# Patient Record
Sex: Female | Born: 1945
Health system: Southern US, Community
[De-identification: ages and names within clinical notes are randomized; demographics above are authoritative.]

## PROBLEM LIST (undated history)

## (undated) DIAGNOSIS — K219 Gastro-esophageal reflux disease without esophagitis: Secondary | ICD-10-CM

## (undated) DIAGNOSIS — I10 Essential (primary) hypertension: Secondary | ICD-10-CM

## (undated) DIAGNOSIS — E875 Hyperkalemia: Secondary | ICD-10-CM

## (undated) DIAGNOSIS — M6282 Rhabdomyolysis: Secondary | ICD-10-CM

## (undated) DIAGNOSIS — I82409 Acute embolism and thrombosis of unspecified deep veins of unspecified lower extremity: Secondary | ICD-10-CM

## (undated) DIAGNOSIS — F329 Major depressive disorder, single episode, unspecified: Secondary | ICD-10-CM

## (undated) DIAGNOSIS — Z794 Long term (current) use of insulin: Secondary | ICD-10-CM

## (undated) DIAGNOSIS — E785 Hyperlipidemia, unspecified: Secondary | ICD-10-CM

## (undated) DIAGNOSIS — J9601 Acute respiratory failure with hypoxia: Secondary | ICD-10-CM

## (undated) DIAGNOSIS — E119 Type 2 diabetes mellitus without complications: Secondary | ICD-10-CM

## (undated) DIAGNOSIS — I5032 Chronic diastolic (congestive) heart failure: Secondary | ICD-10-CM

## (undated) DIAGNOSIS — G9340 Encephalopathy, unspecified: Secondary | ICD-10-CM

## (undated) DIAGNOSIS — I509 Heart failure, unspecified: Secondary | ICD-10-CM

## (undated) DIAGNOSIS — R7989 Other specified abnormal findings of blood chemistry: Secondary | ICD-10-CM

## (undated) DIAGNOSIS — F32A Depression, unspecified: Secondary | ICD-10-CM

## (undated) DIAGNOSIS — N179 Acute kidney failure, unspecified: Secondary | ICD-10-CM

## (undated) DIAGNOSIS — D649 Anemia, unspecified: Secondary | ICD-10-CM

## (undated) DIAGNOSIS — I440 Atrioventricular block, first degree: Secondary | ICD-10-CM

## (undated) DIAGNOSIS — R569 Unspecified convulsions: Secondary | ICD-10-CM

## (undated) DIAGNOSIS — N184 Chronic kidney disease, stage 4 (severe): Secondary | ICD-10-CM

## (undated) DIAGNOSIS — R001 Bradycardia, unspecified: Secondary | ICD-10-CM

## (undated) DIAGNOSIS — I742 Embolism and thrombosis of arteries of the upper extremities: Secondary | ICD-10-CM

## (undated) DIAGNOSIS — G4733 Obstructive sleep apnea (adult) (pediatric): Secondary | ICD-10-CM

## (undated) DIAGNOSIS — Z6841 Body Mass Index (BMI) 40.0 and over, adult: Secondary | ICD-10-CM

## (undated) HISTORY — DX: Rhabdomyolysis: M62.82

## (undated) HISTORY — DX: Essential (primary) hypertension: I10

## (undated) HISTORY — DX: Acute kidney failure, unspecified: N17.9

## (undated) HISTORY — DX: Obstructive sleep apnea (adult) (pediatric): G47.33

## (undated) HISTORY — DX: Heart failure, unspecified: I50.9

## (undated) HISTORY — DX: Body Mass Index (BMI) 40.0 and over, adult: Z684

## (undated) HISTORY — DX: Hyperlipidemia, unspecified: E78.5

## (undated) HISTORY — DX: Embolism and thrombosis of arteries of the upper extremities: I74.2

## (undated) HISTORY — DX: Long term (current) use of insulin: Z79.4

## (undated) HISTORY — DX: Type 2 diabetes mellitus without complications: E11.9

## (undated) HISTORY — DX: Major depressive disorder, single episode, unspecified: F32.9

## (undated) HISTORY — DX: Morbid (severe) obesity due to excess calories: E66.01

## (undated) HISTORY — DX: Acute respiratory failure with hypoxia: J96.01

## (undated) HISTORY — DX: Bradycardia, unspecified: R00.1

## (undated) HISTORY — DX: Encephalopathy, unspecified: G93.40

## (undated) HISTORY — DX: Depression, unspecified: F32.A

## (undated) HISTORY — PX: TUBAL LIGATION: SHX77

## (undated) HISTORY — DX: Atrioventricular block, first degree: I44.0

## (undated) HISTORY — DX: Other specified abnormal findings of blood chemistry: R79.89

## (undated) HISTORY — DX: Hyperkalemia: E87.5

## (undated) HISTORY — DX: Chronic kidney disease, stage 4 (severe): N18.4

## (undated) HISTORY — DX: Chronic diastolic (congestive) heart failure: I50.32

---

## 1997-08-02 ENCOUNTER — Other Ambulatory Visit: Admission: RE | Admit: 1997-08-02 | Discharge: 1997-08-02 | Payer: Self-pay | Admitting: Family Medicine

## 1998-07-10 ENCOUNTER — Ambulatory Visit (HOSPITAL_COMMUNITY): Admission: RE | Admit: 1998-07-10 | Discharge: 1998-07-10 | Payer: Self-pay | Admitting: Family Medicine

## 1998-07-10 ENCOUNTER — Encounter: Payer: Self-pay | Admitting: Family Medicine

## 2001-04-04 ENCOUNTER — Ambulatory Visit (HOSPITAL_COMMUNITY): Admission: RE | Admit: 2001-04-04 | Discharge: 2001-04-04 | Payer: Self-pay | Admitting: Family Medicine

## 2001-04-04 ENCOUNTER — Encounter: Payer: Self-pay | Admitting: Family Medicine

## 2001-11-23 ENCOUNTER — Ambulatory Visit (HOSPITAL_COMMUNITY): Admission: RE | Admit: 2001-11-23 | Discharge: 2001-11-23 | Payer: Self-pay | Admitting: Family Medicine

## 2003-05-14 ENCOUNTER — Ambulatory Visit (HOSPITAL_COMMUNITY): Admission: RE | Admit: 2003-05-14 | Discharge: 2003-05-14 | Payer: Self-pay | Admitting: Family Medicine

## 2003-10-29 ENCOUNTER — Ambulatory Visit: Payer: Self-pay | Admitting: Family Medicine

## 2003-11-05 ENCOUNTER — Ambulatory Visit: Payer: Self-pay | Admitting: Family Medicine

## 2003-12-03 ENCOUNTER — Ambulatory Visit: Payer: Self-pay | Admitting: Family Medicine

## 2003-12-04 ENCOUNTER — Ambulatory Visit: Payer: Self-pay | Admitting: *Deleted

## 2004-02-04 ENCOUNTER — Ambulatory Visit: Payer: Self-pay | Admitting: Family Medicine

## 2004-06-10 ENCOUNTER — Ambulatory Visit: Payer: Self-pay | Admitting: Family Medicine

## 2004-06-16 ENCOUNTER — Ambulatory Visit (HOSPITAL_COMMUNITY): Admission: RE | Admit: 2004-06-16 | Discharge: 2004-06-16 | Payer: Self-pay | Admitting: Family Medicine

## 2005-01-04 ENCOUNTER — Ambulatory Visit: Payer: Self-pay | Admitting: Family Medicine

## 2005-02-18 ENCOUNTER — Ambulatory Visit: Payer: Self-pay | Admitting: Family Medicine

## 2005-05-04 ENCOUNTER — Ambulatory Visit: Payer: Self-pay | Admitting: Family Medicine

## 2005-05-04 ENCOUNTER — Ambulatory Visit (HOSPITAL_COMMUNITY): Admission: RE | Admit: 2005-05-04 | Discharge: 2005-05-04 | Payer: Self-pay | Admitting: Family Medicine

## 2005-08-25 ENCOUNTER — Ambulatory Visit: Payer: Self-pay | Admitting: Family Medicine

## 2005-08-25 LAB — CONVERTED CEMR LAB: Blood Glucose, Fasting: 82 mg/dL

## 2005-09-01 ENCOUNTER — Ambulatory Visit (HOSPITAL_COMMUNITY): Admission: RE | Admit: 2005-09-01 | Discharge: 2005-09-01 | Payer: Self-pay | Admitting: Family Medicine

## 2006-02-09 ENCOUNTER — Ambulatory Visit: Payer: Self-pay | Admitting: Family Medicine

## 2006-02-17 ENCOUNTER — Ambulatory Visit (HOSPITAL_COMMUNITY): Admission: RE | Admit: 2006-02-17 | Discharge: 2006-02-17 | Payer: Self-pay | Admitting: Family Medicine

## 2006-07-13 ENCOUNTER — Ambulatory Visit: Payer: Self-pay | Admitting: Family Medicine

## 2006-09-09 ENCOUNTER — Encounter (INDEPENDENT_AMBULATORY_CARE_PROVIDER_SITE_OTHER): Payer: Self-pay | Admitting: Family Medicine

## 2006-09-09 DIAGNOSIS — E785 Hyperlipidemia, unspecified: Secondary | ICD-10-CM

## 2006-09-09 DIAGNOSIS — E119 Type 2 diabetes mellitus without complications: Secondary | ICD-10-CM

## 2006-09-09 DIAGNOSIS — Z8719 Personal history of other diseases of the digestive system: Secondary | ICD-10-CM

## 2006-09-09 DIAGNOSIS — I10 Essential (primary) hypertension: Secondary | ICD-10-CM | POA: Insufficient documentation

## 2006-09-16 ENCOUNTER — Ambulatory Visit: Payer: Self-pay | Admitting: Family Medicine

## 2006-09-16 DIAGNOSIS — R569 Unspecified convulsions: Secondary | ICD-10-CM

## 2006-09-16 DIAGNOSIS — F329 Major depressive disorder, single episode, unspecified: Secondary | ICD-10-CM

## 2007-02-28 ENCOUNTER — Ambulatory Visit: Payer: Self-pay | Admitting: Family Medicine

## 2007-03-20 ENCOUNTER — Encounter: Admission: RE | Admit: 2007-03-20 | Discharge: 2007-03-20 | Payer: Self-pay | Admitting: Family Medicine

## 2007-05-18 ENCOUNTER — Ambulatory Visit: Payer: Self-pay | Admitting: Family Medicine

## 2007-05-18 LAB — CONVERTED CEMR LAB
AST: 16 units/L (ref 0–37)
Alkaline Phosphatase: 154 units/L — ABNORMAL HIGH (ref 39–117)
BUN: 25 mg/dL — ABNORMAL HIGH (ref 6–23)
Calcium: 9.1 mg/dL (ref 8.4–10.5)
Chloride: 107 meq/L (ref 96–112)
Glucose, Bld: 103 mg/dL — ABNORMAL HIGH (ref 70–99)
Microalb, Ur: 0.96 mg/dL (ref 0.00–1.89)
Total Bilirubin: 0.4 mg/dL (ref 0.3–1.2)
Triglycerides: 62 mg/dL (ref <150)

## 2007-05-25 ENCOUNTER — Ambulatory Visit: Payer: Self-pay | Admitting: Family Medicine

## 2007-06-22 ENCOUNTER — Ambulatory Visit: Payer: Self-pay | Admitting: Family Medicine

## 2007-06-22 LAB — CONVERTED CEMR LAB
CO2: 20 meq/L (ref 19–32)
Calcium: 8.5 mg/dL (ref 8.4–10.5)
Chloride: 110 meq/L (ref 96–112)
Creatinine, Ser: 1.47 mg/dL — ABNORMAL HIGH (ref 0.40–1.20)
Potassium: 5.9 meq/L — ABNORMAL HIGH (ref 3.5–5.3)
Sodium: 137 meq/L (ref 135–145)
Vit D, 1,25-Dihydroxy: 6 — ABNORMAL LOW (ref 30–89)

## 2007-07-21 ENCOUNTER — Ambulatory Visit: Payer: Self-pay | Admitting: Family Medicine

## 2007-10-20 ENCOUNTER — Inpatient Hospital Stay (HOSPITAL_COMMUNITY): Admission: EM | Admit: 2007-10-20 | Discharge: 2007-10-22 | Payer: Self-pay | Admitting: Emergency Medicine

## 2007-10-25 ENCOUNTER — Encounter (INDEPENDENT_AMBULATORY_CARE_PROVIDER_SITE_OTHER): Payer: Self-pay | Admitting: Family Medicine

## 2007-10-25 ENCOUNTER — Ambulatory Visit: Payer: Self-pay | Admitting: Internal Medicine

## 2007-10-25 LAB — CONVERTED CEMR LAB: Potassium: 4.4 meq/L (ref 3.5–5.3)

## 2007-12-12 ENCOUNTER — Ambulatory Visit: Payer: Self-pay | Admitting: Family Medicine

## 2007-12-12 LAB — CONVERTED CEMR LAB
Calcium: 8.6 mg/dL (ref 8.4–10.5)
Glucose, Bld: 203 mg/dL — ABNORMAL HIGH (ref 70–99)
Sodium: 140 meq/L (ref 135–145)

## 2007-12-13 ENCOUNTER — Emergency Department (HOSPITAL_COMMUNITY): Admission: EM | Admit: 2007-12-13 | Discharge: 2007-12-13 | Payer: Self-pay | Admitting: Emergency Medicine

## 2007-12-14 ENCOUNTER — Ambulatory Visit: Payer: Self-pay | Admitting: Family Medicine

## 2007-12-25 ENCOUNTER — Ambulatory Visit: Payer: Self-pay | Admitting: Internal Medicine

## 2008-01-05 ENCOUNTER — Ambulatory Visit: Payer: Self-pay | Admitting: Family Medicine

## 2008-02-22 ENCOUNTER — Ambulatory Visit: Payer: Self-pay | Admitting: Family Medicine

## 2008-03-16 ENCOUNTER — Emergency Department (HOSPITAL_COMMUNITY): Admission: EM | Admit: 2008-03-16 | Discharge: 2008-03-16 | Payer: Self-pay | Admitting: Emergency Medicine

## 2008-05-30 ENCOUNTER — Ambulatory Visit: Payer: Self-pay | Admitting: Family Medicine

## 2008-05-30 LAB — CONVERTED CEMR LAB
Alkaline Phosphatase: 143 units/L — ABNORMAL HIGH (ref 39–117)
Calcium: 8.6 mg/dL (ref 8.4–10.5)
Potassium: 4.4 meq/L (ref 3.5–5.3)
Sodium: 135 meq/L (ref 135–145)
TSH: 2.057 microintl units/mL (ref 0.350–4.500)
Total Protein: 7 g/dL (ref 6.0–8.3)

## 2008-06-04 ENCOUNTER — Ambulatory Visit (HOSPITAL_COMMUNITY): Admission: RE | Admit: 2008-06-04 | Discharge: 2008-06-04 | Payer: Self-pay | Admitting: Family Medicine

## 2008-11-05 ENCOUNTER — Ambulatory Visit: Payer: Self-pay | Admitting: Family Medicine

## 2009-04-23 ENCOUNTER — Encounter (INDEPENDENT_AMBULATORY_CARE_PROVIDER_SITE_OTHER): Payer: Self-pay | Admitting: *Deleted

## 2009-05-29 ENCOUNTER — Ambulatory Visit: Payer: Self-pay | Admitting: Family Medicine

## 2009-05-29 LAB — CONVERTED CEMR LAB
Basophils Absolute: 0 10*3/uL (ref 0.0–0.1)
Basophils Relative: 0 % (ref 0–1)
CO2: 23 meq/L (ref 19–32)
Calcium: 9.2 mg/dL (ref 8.4–10.5)
HCT: 33.3 % — ABNORMAL LOW (ref 36.0–46.0)
Hemoglobin: 11.6 g/dL — ABNORMAL LOW (ref 12.0–15.0)
MCHC: 34.8 g/dL (ref 30.0–36.0)
MCV: 87.9 fL (ref 78.0–100.0)
Monocytes Relative: 11 % (ref 3–12)
Platelets: 226 10*3/uL (ref 150–400)
RBC: 3.79 M/uL — ABNORMAL LOW (ref 3.87–5.11)
RDW: 11.6 % (ref 11.5–15.5)
WBC: 4.6 10*3/uL (ref 4.0–10.5)

## 2009-06-09 ENCOUNTER — Ambulatory Visit (HOSPITAL_COMMUNITY): Admission: RE | Admit: 2009-06-09 | Discharge: 2009-06-09 | Payer: Self-pay | Admitting: Family Medicine

## 2009-06-09 ENCOUNTER — Encounter: Admission: RE | Admit: 2009-06-09 | Discharge: 2009-06-09 | Payer: Self-pay | Admitting: Family Medicine

## 2009-08-06 ENCOUNTER — Emergency Department (HOSPITAL_COMMUNITY): Admission: EM | Admit: 2009-08-06 | Discharge: 2009-08-06 | Payer: Self-pay | Admitting: Family Medicine

## 2009-08-06 ENCOUNTER — Emergency Department (HOSPITAL_COMMUNITY): Admission: EM | Admit: 2009-08-06 | Discharge: 2009-08-06 | Payer: Self-pay | Admitting: Emergency Medicine

## 2009-08-19 ENCOUNTER — Ambulatory Visit: Payer: Self-pay | Admitting: Family Medicine

## 2010-02-15 ENCOUNTER — Encounter: Payer: Self-pay | Admitting: Family Medicine

## 2010-02-24 NOTE — Letter (Signed)
Summary: Generic Letter  [facility]  [address]   [place], [place] [address]   Phone: [phone]  Fax: [phone]    04/23/2009  [name] [address] [place], [place]  [address]  Dear Ms. Botkin,  We have been unable to contact you by phone. Please call our office to schedule a visit. You will need to see Dr. Leward Quan and have lab work done prior to additional medication refills.         Sincerely,   Bridgett Larsson RN

## 2010-04-03 ENCOUNTER — Emergency Department (HOSPITAL_COMMUNITY): Payer: Medicare Other

## 2010-04-03 ENCOUNTER — Inpatient Hospital Stay (HOSPITAL_COMMUNITY)
Admission: EM | Admit: 2010-04-03 | Discharge: 2010-04-06 | DRG: 641 | Disposition: A | Discharge status: Home / Self Care | Payer: Medicare Other | Attending: Family Medicine | Admitting: Family Medicine

## 2010-04-03 ENCOUNTER — Encounter (INDEPENDENT_AMBULATORY_CARE_PROVIDER_SITE_OTHER): Payer: Self-pay | Admitting: Family Medicine

## 2010-04-03 DIAGNOSIS — I129 Hypertensive chronic kidney disease with stage 1 through stage 4 chronic kidney disease, or unspecified chronic kidney disease: Secondary | ICD-10-CM | POA: Diagnosis present

## 2010-04-03 DIAGNOSIS — T502X5A Adverse effect of carbonic-anhydrase inhibitors, benzothiadiazides and other diuretics, initial encounter: Secondary | ICD-10-CM | POA: Diagnosis present

## 2010-04-03 DIAGNOSIS — D638 Anemia in other chronic diseases classified elsewhere: Secondary | ICD-10-CM | POA: Diagnosis present

## 2010-04-03 DIAGNOSIS — G40909 Epilepsy, unspecified, not intractable, without status epilepticus: Secondary | ICD-10-CM | POA: Diagnosis present

## 2010-04-03 DIAGNOSIS — N183 Chronic kidney disease, stage 3 unspecified: Secondary | ICD-10-CM | POA: Diagnosis present

## 2010-04-03 DIAGNOSIS — E875 Hyperkalemia: Principal | ICD-10-CM | POA: Diagnosis present

## 2010-04-03 DIAGNOSIS — Z79899 Other long term (current) drug therapy: Secondary | ICD-10-CM

## 2010-04-03 DIAGNOSIS — N179 Acute kidney failure, unspecified: Secondary | ICD-10-CM | POA: Diagnosis present

## 2010-04-03 DIAGNOSIS — E1169 Type 2 diabetes mellitus with other specified complication: Secondary | ICD-10-CM | POA: Diagnosis not present

## 2010-04-03 DIAGNOSIS — E785 Hyperlipidemia, unspecified: Secondary | ICD-10-CM | POA: Diagnosis present

## 2010-04-03 LAB — GLUCOSE, CAPILLARY
Glucose-Capillary: 116 mg/dL — ABNORMAL HIGH (ref 70–99)
Glucose-Capillary: 122 mg/dL — ABNORMAL HIGH (ref 70–99)
Glucose-Capillary: 221 mg/dL — ABNORMAL HIGH (ref 70–99)

## 2010-04-03 LAB — CONVERTED CEMR LAB
ALT: 13 units/L (ref 0–35)
AST: 16 units/L (ref 0–37)
BUN: 27 mg/dL — ABNORMAL HIGH (ref 6–23)
Basophils Absolute: 0 10*3/uL (ref 0.0–0.1)
Basophils Relative: 0 % (ref 0–1)
Cholesterol: 139 mg/dL (ref 0–200)
Creatinine, Ser: 1.83 mg/dL — ABNORMAL HIGH (ref 0.40–1.20)
Eosinophils Relative: 0 % (ref 0–5)
HCT: 33.2 % — ABNORMAL LOW (ref 36.0–46.0)
HDL: 52 mg/dL (ref >39)
Hemoglobin: 10.7 g/dL — ABNORMAL LOW (ref 12.0–15.0)
Hgb A1c MFr Bld: 6.5 % — ABNORMAL HIGH (ref <5.7)
MCHC: 32.2 g/dL (ref 30.0–36.0)
MCV: 90.5 fL (ref 78.0–100.0)
Monocytes Absolute: 0.5 10*3/uL (ref 0.1–1.0)
RDW: 12.5 % (ref 11.5–15.5)
TSH: 1.626 microintl units/mL (ref 0.350–4.500)
Total Bilirubin: 0.3 mg/dL (ref 0.3–1.2)
Total CHOL/HDL Ratio: 2.7
VLDL: 18 mg/dL (ref 0–40)

## 2010-04-03 LAB — DIFFERENTIAL
Basophils Absolute: 0 10*3/uL (ref 0.0–0.1)
Basophils Relative: 0 % (ref 0–1)
Eosinophils Relative: 0 % (ref 0–5)
Lymphs Abs: 1.1 10*3/uL (ref 0.7–4.0)
Monocytes Relative: 12 % (ref 3–12)

## 2010-04-03 LAB — COMPREHENSIVE METABOLIC PANEL
Albumin: 3.6 g/dL (ref 3.5–5.2)
Alkaline Phosphatase: 143 U/L — ABNORMAL HIGH (ref 39–117)
BUN: 25 mg/dL — ABNORMAL HIGH (ref 6–23)
CO2: 22 mEq/L (ref 19–32)
Calcium: 8.3 mg/dL — ABNORMAL LOW (ref 8.4–10.5)
GFR calc Af Amer: 36 mL/min — ABNORMAL LOW (ref >60)
Potassium: 5.6 mEq/L — ABNORMAL HIGH (ref 3.5–5.1)
Sodium: 135 mEq/L (ref 135–145)
Total Bilirubin: 0.5 mg/dL (ref 0.3–1.2)

## 2010-04-03 LAB — BASIC METABOLIC PANEL
BUN: 26 mg/dL — ABNORMAL HIGH (ref 6–23)
Creatinine, Ser: 1.75 mg/dL — ABNORMAL HIGH (ref 0.4–1.2)
GFR calc Af Amer: 36 mL/min — ABNORMAL LOW (ref >60)
GFR calc non Af Amer: 29 mL/min — ABNORMAL LOW (ref >60)
Potassium: 7 mEq/L (ref 3.5–5.1)

## 2010-04-03 LAB — CBC
Hemoglobin: 10.8 g/dL — ABNORMAL LOW (ref 12.0–15.0)
MCH: 29.3 pg (ref 26.0–34.0)
Platelets: 198 10*3/uL (ref 150–400)
RDW: 12.3 % (ref 11.5–15.5)
WBC: 3.7 10*3/uL — ABNORMAL LOW (ref 4.0–10.5)

## 2010-04-03 LAB — POTASSIUM: Potassium: 5.4 mEq/L — ABNORMAL HIGH (ref 3.5–5.1)

## 2010-04-03 LAB — POCT I-STAT, CHEM 8
BUN: 29 mg/dL — ABNORMAL HIGH (ref 6–23)
Calcium, Ion: 1.22 mmol/L (ref 1.12–1.32)
Creatinine, Ser: 1.8 mg/dL — ABNORMAL HIGH (ref 0.4–1.2)
Glucose, Bld: 115 mg/dL — ABNORMAL HIGH (ref 70–99)
HCT: 35 % — ABNORMAL LOW (ref 36.0–46.0)
TCO2: 20 mmol/L (ref 0–100)

## 2010-04-03 LAB — URINALYSIS, ROUTINE W REFLEX MICROSCOPIC
Bilirubin Urine: NEGATIVE
Ketones, ur: NEGATIVE mg/dL
Nitrite: NEGATIVE
Protein, ur: NEGATIVE mg/dL
pH: 5.5 (ref 5.0–8.0)

## 2010-04-03 LAB — URINE MICROSCOPIC-ADD ON

## 2010-04-04 LAB — BASIC METABOLIC PANEL
BUN: 24 mg/dL — ABNORMAL HIGH (ref 6–23)
BUN: 21 mg/dL (ref 6–23)
BUN: 20 mg/dL (ref 6–23)
Chloride: 110 mEq/L (ref 96–112)
Chloride: 112 mEq/L (ref 96–112)
Chloride: 113 mEq/L — ABNORMAL HIGH (ref 96–112)
Glucose, Bld: 114 mg/dL — ABNORMAL HIGH (ref 70–99)
Glucose, Bld: 159 mg/dL — ABNORMAL HIGH (ref 70–99)
Glucose, Bld: 199 mg/dL — ABNORMAL HIGH (ref 70–99)
Potassium: 5.9 mEq/L — ABNORMAL HIGH (ref 3.5–5.1)
Potassium: 5.6 mEq/L — ABNORMAL HIGH (ref 3.5–5.1)
Potassium: 5.3 mEq/L — ABNORMAL HIGH (ref 3.5–5.1)

## 2010-04-05 LAB — GLUCOSE, CAPILLARY
Glucose-Capillary: 258 mg/dL — ABNORMAL HIGH (ref 70–99)
Glucose-Capillary: 154 mg/dL — ABNORMAL HIGH (ref 70–99)
Glucose-Capillary: 166 mg/dL — ABNORMAL HIGH (ref 70–99)

## 2010-04-05 LAB — BASIC METABOLIC PANEL
CO2: 17 mEq/L — ABNORMAL LOW (ref 19–32)
Chloride: 111 mEq/L (ref 96–112)
Creatinine, Ser: 1.47 mg/dL — ABNORMAL HIGH (ref 0.4–1.2)
GFR calc Af Amer: 43 mL/min — ABNORMAL LOW (ref >60)
GFR calc non Af Amer: 40 mL/min — ABNORMAL LOW (ref >60)
GFR calc non Af Amer: 36 mL/min — ABNORMAL LOW (ref >60)
Glucose, Bld: 171 mg/dL — ABNORMAL HIGH (ref 70–99)
Potassium: 5 mEq/L (ref 3.5–5.1)
Sodium: 138 mEq/L (ref 135–145)

## 2010-04-06 LAB — BASIC METABOLIC PANEL
BUN: 14 mg/dL (ref 6–23)
Calcium: 8 mg/dL — ABNORMAL LOW (ref 8.4–10.5)
Creatinine, Ser: 1.38 mg/dL — ABNORMAL HIGH (ref 0.4–1.2)
GFR calc non Af Amer: 39 mL/min — ABNORMAL LOW (ref >60)
Potassium: 4.6 mEq/L (ref 3.5–5.1)

## 2010-04-06 LAB — GLUCOSE, CAPILLARY: Glucose-Capillary: 141 mg/dL — ABNORMAL HIGH (ref 70–99)

## 2010-04-06 NOTE — Discharge Summary (Signed)
[name], [name]                ACCOUNT NO.:  [id number]  MEDICAL RECORD NO.:  [id number]           PATIENT TYPE:  I  LOCATION:  6703                         FACILITY:  [facility]  PHYSICIAN:  Murray Hodgkins, MD    DATE OF BIRTH:  [birth year]  DATE OF ADMISSION:  04/03/2010 DATE OF DISCHARGE:  04/06/2010                        DISCHARGE SUMMARY - REFERRING   PRIMARY CARE PHYSICIAN:  [facility].  CONDITION ON DISCHARGE:  Improved.  DISCHARGE DIAGNOSES: 1. Hyperkalemia secondary to amiloride, resolved. 2. Acute renal failure secondary to diuretic therapy, resolved. 3. Diabetes mellitus type 2 with hypoglycemia, now stable. 4. History of seizure disorder, stable. 5. Chronic anemia, stable.  HISTORY OF PRESENT ILLNESS:  This is a 65 year old woman who was feeling quite well when she went to see her primary care physician for routine blood work.  She was then called and told that her potassium was high and instructed to come to the emergency room.  In the emergency room, it was found that her potassium remained elevated.  And she was found to be in acute renal failure.  She was therefore admitted for stabilization.  HOSPITAL COURSE: 1. Hyperkalemia.  The patient was admitted to medical floor and placed     on telemetry.  Hyperkalemia only slowly resolved with multiple     doses of Kayexalate and in fact continued to rebound even after     many doses of  Kayexalate.  This is not uncommon in medication-     induced hyperkalemia.  Lovenox and heparin were avoided as they can     also contribute to hyperkalemia.  Her blood work will be checked     tomorrow on March 12 and if potassium has remained stable without     further Kayexalate or IV fluids, she will be discharged home. 2. Acute renal failure, superimposed on chronic kidney disease stage     III.  This appears to be resolved at this point and the patient's     creatinine is actually better than previous data points in the E-    Chart.  Diuretics discontinued on discharge.  She was on diuretic     for hypertension.  IV fluids saline locked at this point. 3. Diabetes mellitus with transient hypoglycemia.  This has resolved.     She will continue on Starlix. 4. History of seizure disorder.  This remained stable.  Continue     Tegretol. 5. Chronic anemia.  This remained stable.  CONSULTATIONS:  None.  PROCEDURES:  None.  IMAGING:  Chest x-ray on March 09:  No acute cardiopulmonary disease.  PERTINENT LABORATORY STUDIES: 1. CBC unremarkable. 2. Basic metabolic panel notable for potassium of 5.7 on admission     with a creatinine of 1.8, peak potassium is of 5.9.  Potassium     today 5.0 and creatinine 1.34.  Repeat basic metabolic panel     pending for the morning.  DISCHARGE INSTRUCTIONS:  The patient will be discharged home.  Diet is a heart-healthy diabetic diet.  Activities as tolerated.  She should follow up with her physician at [facility] in 1-2 weeks.  DISCHARGE MEDICATIONS:  1. Clonidine 0.1 mg p.o. b.i.d. 2. Carbamazepine 200 mg 2 tablets p.o. b.i.d. 3. Citalopram 20 mg p.o. daily. 4. Glipizide XL 10 mg p.o. b.i.d. 5. Metoprolol XL 50 mg p.o. daily. 6. Nateglinide 120 mg p.o. t.i.d. 7. Vytorin 10/40 one tablet every evening.  Discontinue following medication. 1. Combination tablet amiloride/hydrochlorothiazide.  Clonidine has been added as a second agent for blood pressure control.  THINGS TO FOLLOW UP IN THE OUTPATIENT SETTING: 1. Continue treatment for her hypertension.  Consider repeat basic     metabolic panel.  Time coordinating discharge 35 minutes.     Murray Hodgkins, MD     DG/MEDQ  D:  04/05/2010  T:  04/05/2010  Job:  [id number]  Electronically Signed by Murray Hodgkins  on 04/06/2010 08:37:16 PM

## 2010-04-12 LAB — URINE CULTURE
Colony Count: >=100000
Culture  Setup Time: 201107130039

## 2010-04-12 LAB — URINE MICROSCOPIC-ADD ON

## 2010-04-12 LAB — URINALYSIS, ROUTINE W REFLEX MICROSCOPIC
Bilirubin Urine: NEGATIVE
Ketones, ur: NEGATIVE mg/dL
Protein, ur: NEGATIVE mg/dL
Urobilinogen, UA: 0.2 mg/dL (ref 0.0–1.0)

## 2010-04-12 LAB — DIFFERENTIAL
Basophils Absolute: 0 10*3/uL (ref 0.0–0.1)
Basophils Relative: 0 % (ref 0–1)
Eosinophils Relative: 0 % (ref 0–5)
Monocytes Absolute: 0.6 10*3/uL (ref 0.1–1.0)

## 2010-04-12 LAB — COMPREHENSIVE METABOLIC PANEL
ALT: 17 U/L (ref 0–35)
Calcium: 8.5 mg/dL (ref 8.4–10.5)
Creatinine, Ser: 1.57 mg/dL — ABNORMAL HIGH (ref 0.4–1.2)
Glucose, Bld: 190 mg/dL — ABNORMAL HIGH (ref 70–99)
Sodium: 140 mEq/L (ref 135–145)
Total Protein: 6.9 g/dL (ref 6.0–8.3)

## 2010-04-12 LAB — CBC
HCT: 34.5 % — ABNORMAL LOW (ref 36.0–46.0)
MCH: 31.4 pg (ref 26.0–34.0)
MCHC: 33.4 g/dL (ref 30.0–36.0)
RDW: 12.3 % (ref 11.5–15.5)

## 2010-04-18 NOTE — H&P (Signed)
[name], [name]                ACCOUNT NO.:  [id number]  MEDICAL RECORD NO.:  [id number]           PATIENT TYPE:  I  LOCATION:  6703                         FACILITY:  [facility]  PHYSICIAN:  Murray Hodgkins, MD    DATE OF BIRTH:  1945/11/12  DATE OF ADMISSION:  04/03/2010 DATE OF DISCHARGE:                             HISTORY & PHYSICAL   PRIMARY CARE PHYSICIAN:  [facility].  CHIEF COMPLAINT:  The patient is sent by her primary care physician for abnormal lab work.  HISTORY OF PRESENT ILLNESS:  [name] is a 65 year old woman with history of type 2 diabetes, hyperlipidemia, and hypertension who presents to [facility] Emergency Department today with reports of abnormal blood work.  Apparently, the patient was seen by her primary care physician on the day prior to admission for scheduled visit.  At that time, the patient underwent routine lab work showing elevated potassium.  The patient was called at home by primary care physician's office and asked to come to the ER due to lab findings.  The patient denies any complaints.  Specifically, the patient denies any recent headache, dizziness, chest pain, shortness of breath, abdominal pain, nausea, vomiting, diarrhea, or fever.  Upon evaluation in the emergency department, the patient was found to have a potassium of 5.6 with a mild elevation of creatinine at 1.73. The patient had a similar admission in 2009 at which time she was taken off her Avapro.  The patient is to be admitted by [facility] at this time for further evaluation and treatment.  PAST MEDICAL HISTORY: 1. Type 2 diabetes. 2. Hyperlipidemia. 3. Hypertension. 4. History of seizure disorder. 5. Remote BTL. 6. Chronic anemia.  MEDICATIONS: 1. Tegretol 200 mg 1 tablets p.o. b.i.d. 2. Vytorin 10-40 mg p.o. b.i.d. 3. Citalopram 20 mg p.o. b.i.d. 4. Starlix 120 mg p.o. t.i.d. 5. Metoprolol XL 50 mg p.o. b.i.d. 6. Amiloride/HCTZ 5/50 one tablet  p.o. daily. 7. Glipizide XL 10 mg p.o. daily.  ALLERGIES:  No known drug allergies.  FAMILY HISTORY:  Mother is deceased at age 70 with diabetic complications.  Unknown history with the patient's father.  The patient has 2 brothers and 1 sister who are alive and well.  SOCIAL HISTORY:  The patient is single.  She is currently living with her daughter.  She denies tobacco or EtOH use.  REVIEW OF SYSTEMS:  As stated in HPI, otherwise negative.  PHYSICAL EXAMINATION:  VITAL SIGNS:  Blood pressure 144/61, heart rate 62, respirations 18, temperature 98.3, and O2 sat 100% on room air. GENERAL:  This is a morbidly obese African American female, sitting on bedside, in no acute distress. HEAD:  Normocephalic and atraumatic. EYES:  Extraocular movements are intact without scleral icterus or injection. EAR, NOSE, AND THROAT:  Mucous membranes are moist.  No oropharyngeal lesions. NECK:  Thick and supple with no thyromegaly or lymphadenopathy.  No JVD or carotid bruits. CHEST:  With symmetrical movement, nontender to palpation. CARDIOVASCULAR:  S1 and S2.  Regular rate and rhythm.  No murmur, rub, or gallop.  The patient with trace pretibial edema bilaterally to lower extremities,  to midshin. RESPIRATORY:  Lung sounds are clear to auscultation bilaterally.  No wheezes, rales, or crackles.  No increased work of breathing. GI:  Abdomen is obese, soft, nontender, and nondistended with positive bowel sounds.  No appreciated masses or hepatosplenomegaly. NEUROLOGICAL:  The patient is able to move all extremities x4 without motor or sensory deficit on exam. PSYCHOLOGICAL:  The patient is alert and oriented x4 with very pleasant mood and affect.  PERTINENT LABORATORY DATA AND ANCILLARY STUDIES:  White cell count 3.7, platelet count 198, hemoglobin 10.8 with MCV 88.6, and hematocrit 32.6. Sodium 135, potassium 5.6, chloride 110, CO2 of 22, BUN 25, creatinine 1.73, serum glucose 118, total  bilirubin 0.5, alkaline phosphatase 143, AST 16, ALT 17, albumin 3.6.  Chest x-ray showing no active disease.  EKG showing sinus rhythm at 63 beats per minute with no acute T-wave abnormalities.  ASSESSMENT/PLAN: 1. Hyperkalemia.  Likely medication effect with amiloride/HCTZ plus or     minus some mild dehydration with diuretic use.  The patient will be     admitted to Telemetry Unit.  She has been given p.o. Kayexalate as     well as IV insulin and D50 in the emergency department.  We will     recheck the patient's potassium this evening to determine need for     further treatment.  We will order for gentle IV fluid hydration. 2. Acute renal failure in the setting of chronic kidney disease.     Suspect related to home diuretic use.  Creatinine only mildly     elevated at this time.  We will order for gentle IV fluid.  We will     hold the patient's diuretic.  We will recheck the patient's renal     function in the morning to determine need for further workup. 3. Anemia, chronic disease.  Stable. 4. Type 2 diabetes.  We will order for sliding scale insulin while     inpatient along with the patient's Starlix.  We     will check hemoglobin A1c. 5. Prophylaxis.  We will avoid subcu Lovenox and heparin as this can     exacerbate the patient's hyperkalemia.  We will order for SCDs for     DVT prophylaxis.     Patrici Ranks, NP   ______________________________ Murray Hodgkins, MD    LE/MEDQ  D:  04/06/2010  T:  04/06/2010  Job:  [id number]  cc:   Barton Fanny, M.D.  Electronically Signed by Patrici Ranks NP on 04/17/2010 10:09:31 AM Electronically Signed by Murray Hodgkins  on 04/18/2010 12:25:39 PM

## 2010-05-14 ENCOUNTER — Emergency Department (HOSPITAL_COMMUNITY)
Admission: EM | Admit: 2010-05-14 | Discharge: 2010-05-14 | Disposition: A | Discharge status: Home / Self Care | Payer: Medicare Other | Attending: Emergency Medicine | Admitting: Emergency Medicine

## 2010-05-14 DIAGNOSIS — R569 Unspecified convulsions: Secondary | ICD-10-CM | POA: Insufficient documentation

## 2010-05-14 DIAGNOSIS — I10 Essential (primary) hypertension: Secondary | ICD-10-CM | POA: Insufficient documentation

## 2010-05-14 DIAGNOSIS — E1169 Type 2 diabetes mellitus with other specified complication: Secondary | ICD-10-CM | POA: Insufficient documentation

## 2010-05-14 DIAGNOSIS — E785 Hyperlipidemia, unspecified: Secondary | ICD-10-CM | POA: Insufficient documentation

## 2010-05-14 LAB — GLUCOSE, CAPILLARY: Glucose-Capillary: 268 mg/dL — ABNORMAL HIGH (ref 70–99)

## 2010-06-09 NOTE — Discharge Summary (Signed)
NAMEGLEE, HINIKER                ACCOUNT NO.:  [id number]   MEDICAL RECORD NO.:  [id number]          PATIENT TYPE:  INP   LOCATION:  6733                         FACILITY:  [facility]   PHYSICIAN:  Rexene Alberts, M.D.    DATE OF BIRTH:  27-Feb-1945   DATE OF ADMISSION:  10/20/2007  DATE OF DISCHARGE:  10/22/2007                               DISCHARGE SUMMARY   DISCHARGE DIAGNOSES:  1. Hyperkalemia, thought to be secondary to Avapro (Avalide).  2. Nausea, vomiting, and dizziness secondary to hyperkalemia.  3. Hypertension.  4. Type 2 diabetes mellitus.  5. Renal insufficiency, likely chronic secondary to diabetes mellitus      and hypertension.  6. Normocytic anemia.  7. Hyperlipidemia.  8. Seizure disorder.  9. Mild fungal cellulitis of the breasts bilaterally.   DISCHARGE MEDICATIONS:  1. Stop Avalide and triamterene.  2. Toprol-XL 50 mg daily.  3. Hydrochlorothiazide 25 mg daily.  4. Over-the-counter nystatin cream applied to breasts as directed.  5. Starlix 120 mg t.i.d. with meals.  6. Vitamin D 50,000 international units weekly.  7. Carbamazepine 200 mg 2 tablets b.i.d.  8. Vytorin 10/40 mg daily.  9. Celexa 20 mg daily.  10.Glipizide ER 10 mg b.i.d.   DISCHARGE DISPOSITION:  The patient is being discharged to home in  improved and stable condition.  She was advised to follow up with her  primary care physician in 2-3 days.   CONSULTATIONS:  None.   PROCEDURES PERFORMED:  1. CT scan of the head on October 20, 2003.  The results revealed no      acute intracranial abnormalities.  2. Chest x-ray on October 20, 2003.  The results revealed      cardiomegaly with linear subsegmental atelectasis at the right      base.   HISTORY OF PRESENT ILLNESS:  The patient is a 65 year old woman with a  past medical history significant for type 2 diabetes mellitus,  hypertension, and seizure disorder.  She presented to the emergency  department on October 20, 2007 with a chief  complaint of nausea,  vomiting, and dizziness.  When the patient was evaluated in the  emergency department, her serum potassium was found to be 7.  A CT scan  of the head was ordered and the results were nonacute.  Her EKG at the  time of the initial hospital assessment revealed normal sinus rhythm  with a heart rate of 73 beats per minute and a first-degree AV block.  The patient was admitted for further evaluation and management.   For additional details, please see the dictated history and physical.   HOSPITAL COURSE:  1. HYPERKALEMIA.  The patient was hemodynamically stable at the time      of the initial hospital assessment.  During the evaluation in the      emergency department, she was treated with calcium gluconate,      sodium bicarbonate, Novolog insulin that followed an amp of D50,      and 2 doses of Kayexalate.  Her serum potassium level was rechecked      shortly thereafter  and it was still elevated at 6.8.  The patient's      serum potassium was monitored frequently.  She was given several      more doses of Kayexalate as well as Lasix.  Her IV fluids which had      been started at normal saline were changed to half-normal saline in      an attempt to decrease her serum potassium further.  On hospital      day #2, her serum potassium improved to 5.8.  After giving more      Lasix and Kayexalate, it decreased to 5.2.  As of today, her serum      potassium is 4.7.  The patient's nausea and vomiting as well as      dizziness have completely resolved.   At the time of the initial assessment by Dr. Arnoldo Morale, the patient could  not recall the names or the doses of her medications.  On hospital day  #2, a medication list was obtained and confirmed with the patient.  In  review of her medications, she had been treated with  triamterene/hydrochlorothiazide and Avalide.  Triamterene and Avalide  were discontinued.  The patient was restarted on hydrochlorothiazide.  She was  instructed to discontinue Avalide and triamterene.  She was also  instructed to continue hydrochlorothiazide at 25 mg daily.  1. HYPERTENSION.  As indicated above, triamterene and Avalide were      discontinued during the hospital course.  Because of her      hypertension, the patient was started on Toprol-XL.  As above, she      was maintained on hydrochlorothiazide 25 mg daily.  The patient was      advised to follow up with her primary care physician Dr. Leward Quan      for further management.  She voiced understanding.  2. TYPE 2 DIABETES MELLITUS.  The patient's capillary blood glucose      was relatively uncontrolled.  Because the patient could not recall      all of her medications, she was simply started on sliding scale      NovoLog.  Once her regimen was clarified, Starlix was restarted at      a smaller dose, however, glipizide ER remain held during the      hospitalization.  Her hemoglobin A1c was found to be 5.8, which      indicated that she had excellent outpatient control of her      diabetes.  Upon discharge, the insulin was discontinued and the      patient was advised to restart Starlix and glipizide ER as      previously prescribed.  3. RENAL INSUFFICIENCY.  The patient's creatinine ranged from 1.4-1.6      during the hospitalization.  More than likely, the patient has      underlying chronic kidney disease as a consequence of diabetes      mellitus and hypertension.  4. SEIZURE DISORDER.  The patient was maintained on Tegretol during      the hospital course.  Her carbamazepine level was therapeutic at      6.7.  5. MILD FUNGAL CELLULITIS.  The patient complained of itchy skin over      her breasts.  The mild rash was consistent with yeast dermatitis.      The patient was given 200 mg dose of Diflucan and nystatin cream      was applied b.i.d.      Rexene Alberts,  M.D.  Electronically Signed     DF/MEDQ  D:  10/22/2007  T:  10/23/2007  Job:  [id number]   cc:    Barton Fanny, M.D.

## 2010-06-09 NOTE — H&P (Signed)
NAMEVIKKIE, [name]                ACCOUNT NO.:  [id number]   MEDICAL RECORD NO.:  [id number]          PATIENT TYPE:  INP   LOCATION:  6733                         FACILITY:  [facility]   PHYSICIAN:  Jana Hakim, M.D. DATE OF BIRTH:  09-Oct-1945   DATE OF ADMISSION:  10/20/2007  DATE OF DISCHARGE:                              HISTORY & PHYSICAL   PRIMARY CARE PHYSICIAN:  [facility],  Dr. Leward Quan.   CHIEF COMPLAINT:  Nausea, vomiting, dizziness.   HISTORY OF PRESENT ILLNESS:  This is a 65 year old female presenting to  the emergency department with complaints of severe nausea and vomiting  x3 over the past 24 hours.  She denies having any diarrhea.  She denies  having any chest pain or shortness of breath.  She denies having any  fevers or chills or myalgias.   PAST MEDICAL HISTORY:  1. Type 2 diabetes mellitus.  2. Hyperlipidemia.  3. Hypertension.  4. Seizure disorder.   PAST SURGICAL HISTORY:  History of a bilateral tubal ligation and what  sounds like a dilatation and curettage.   MEDICATIONS:  The patient will have her family call back with her  medications.  She cannot give the medications or dosages.   SOCIAL HISTORY:  She lives alone.  She is a nonsmoker, nondrinker.   FAMILY HISTORY:  Positive for hypertension and diabetes in her mother.   REVIEW OF SYSTEMS:  Pertinents are mentioned above.   PHYSICAL EXAMINATION:  GENERAL:  This is a 65 year old obese female in  discomfort but no acute distress.  VITAL SIGNS:  Temperature 97.6, blood pressure 149/74, heart rate 72,  respirations 18, oxygen saturation 100% on room air.  HEENT:  Normocephalic, atraumatic.  There is no scleral icterus.  Pupils are  equally round, reactive to light.  Extraocular movements are intact.  Funduscopic benign.  Oropharynx is clear.  NECK:  Neck is supple.  Full range of motion.  No thyromegaly,  adenopathy or jugular venous distention.  CARDIOVASCULAR:  Regular rate and rhythm.  No  murmurs, gallops or rubs.  LUNGS:  Clear to auscultation bilaterally.  ABDOMEN:  Positive bowel sounds.  Soft, nontender, nondistended.  EXTREMITIES:  Without cyanosis, clubbing or edema.  NEUROLOGIC:  Nonfocal.   LABORATORY STUDIES:  White blood cell count 4.2, hemoglobin 11.2,  hematocrit 33.9, platelets 231,000, MCV 90.9, protime 13.6, INR 1.0, PTT  24.  Sodium 135, potassium initially 7.  On recheck was found to be 6.8.  Chloride 109, bicarb 23, BUN 24, creatinine 1.6, glucose 175, ionized  calcium 1.2.  Chest x-ray reveals no acute intracranial abnormalities.   ASSESSMENT:  A 64 year old female being admitted with:  1. Hyperkalemia.  2. Nausea and vomiting.  3. Dizziness.  4. Hypertension.  5. Seizure disorder.   PLAN:  The patient will be admitted to a telemetry area for cardiac  monitoring and medications will be administered to decrease her  potassium level.  Ticlid therapy will be given q.6 h until her potassium  level is less than 5.3.  Short-term therapy will also be administered to  reduce her potassium as well..  The  patient's regular medications will  be further verified in the a.m.[name]  Possibly the patient is on ACE  inhibitor therapy and possibly potassium replacement therapy which could  have been the cause of her hyperkalemia.  When her medications have been  verified, they will be further adjusted.      Jana Hakim, M.D.  Electronically Signed     HJ/MEDQ  D:  10/20/2007  T:  10/21/2007  Job:  [id number]

## 2010-07-27 ENCOUNTER — Other Ambulatory Visit: Payer: Self-pay | Admitting: Family Medicine

## 2010-07-27 DIAGNOSIS — Z1231 Encounter for screening mammogram for malignant neoplasm of breast: Secondary | ICD-10-CM

## 2010-08-03 ENCOUNTER — Ambulatory Visit
Admission: RE | Admit: 2010-08-03 | Discharge: 2010-08-03 | Disposition: A | Discharge status: Home / Self Care | Payer: Medicare Other | Source: Ambulatory Visit | Attending: Family Medicine | Admitting: Family Medicine

## 2010-08-03 DIAGNOSIS — Z1231 Encounter for screening mammogram for malignant neoplasm of breast: Secondary | ICD-10-CM

## 2010-10-26 LAB — CBC
HCT: 33.9 — ABNORMAL LOW
MCV: 89
Platelets: 231
RBC: 3.35 — ABNORMAL LOW
WBC: 4.2
WBC: 4.8

## 2010-10-26 LAB — APTT: aPTT: 24

## 2010-10-26 LAB — POCT I-STAT, CHEM 8
Creatinine, Ser: 1.6 — ABNORMAL HIGH
HCT: 35 — ABNORMAL LOW
Hemoglobin: 11.9 — ABNORMAL LOW
Potassium: 7
Sodium: 135

## 2010-10-26 LAB — RENAL FUNCTION PANEL
BUN: 21
CO2: 23
Chloride: 110
Glucose, Bld: 232 — ABNORMAL HIGH
Phosphorus: 3.8
Potassium: 4.7

## 2010-10-26 LAB — GLUCOSE, CAPILLARY
Glucose-Capillary: 162 — ABNORMAL HIGH
Glucose-Capillary: 162 — ABNORMAL HIGH
Glucose-Capillary: 182 — ABNORMAL HIGH
Glucose-Capillary: 198 — ABNORMAL HIGH
Glucose-Capillary: 234 — ABNORMAL HIGH

## 2010-10-26 LAB — DIFFERENTIAL
Eosinophils Relative: 0
Lymphocytes Relative: 12
Lymphs Abs: 0.5 — ABNORMAL LOW

## 2010-10-26 LAB — BASIC METABOLIC PANEL
Chloride: 107
Creatinine, Ser: 1.64 — ABNORMAL HIGH
GFR calc Af Amer: 39 — ABNORMAL LOW
Potassium: 5.8 — ABNORMAL HIGH

## 2010-10-26 LAB — COMPREHENSIVE METABOLIC PANEL
ALT: 15
AST: 14
Albumin: 3.8
Calcium: 9
GFR calc Af Amer: 46 — ABNORMAL LOW
Sodium: 134 — ABNORMAL LOW
Total Protein: 7.1

## 2010-10-26 LAB — HEMOGLOBIN A1C
Mean Plasma Glucose: 117
Mean Plasma Glucose: 120

## 2010-10-26 LAB — PROTIME-INR
INR: 1
Prothrombin Time: 13.6

## 2010-10-26 LAB — MAGNESIUM: Magnesium: 2

## 2010-10-26 LAB — POTASSIUM: Potassium: 5.7 — ABNORMAL HIGH

## 2010-10-27 LAB — POCT I-STAT, CHEM 8
BUN: 18 mg/dL (ref 6–23)
Chloride: 105 mEq/L (ref 96–112)
Potassium: 4.5 mEq/L (ref 3.5–5.1)
Sodium: 139 mEq/L (ref 135–145)

## 2011-03-02 ENCOUNTER — Encounter (HOSPITAL_COMMUNITY): Payer: Self-pay

## 2011-03-02 ENCOUNTER — Emergency Department (HOSPITAL_COMMUNITY)
Admission: EM | Admit: 2011-03-02 | Discharge: 2011-03-03 | Disposition: A | Discharge status: Home / Self Care | Payer: Medicare Other | Attending: Emergency Medicine | Admitting: Emergency Medicine

## 2011-03-02 DIAGNOSIS — B349 Viral infection, unspecified: Secondary | ICD-10-CM

## 2011-03-02 DIAGNOSIS — R52 Pain, unspecified: Secondary | ICD-10-CM | POA: Insufficient documentation

## 2011-03-02 DIAGNOSIS — R112 Nausea with vomiting, unspecified: Secondary | ICD-10-CM | POA: Insufficient documentation

## 2011-03-02 DIAGNOSIS — E119 Type 2 diabetes mellitus without complications: Secondary | ICD-10-CM | POA: Insufficient documentation

## 2011-03-02 DIAGNOSIS — R739 Hyperglycemia, unspecified: Secondary | ICD-10-CM

## 2011-03-02 DIAGNOSIS — B9789 Other viral agents as the cause of diseases classified elsewhere: Secondary | ICD-10-CM | POA: Insufficient documentation

## 2011-03-02 DIAGNOSIS — R1110 Vomiting, unspecified: Secondary | ICD-10-CM

## 2011-03-02 DIAGNOSIS — R109 Unspecified abdominal pain: Secondary | ICD-10-CM | POA: Insufficient documentation

## 2011-03-02 DIAGNOSIS — R21 Rash and other nonspecific skin eruption: Secondary | ICD-10-CM | POA: Insufficient documentation

## 2011-03-02 DIAGNOSIS — R197 Diarrhea, unspecified: Secondary | ICD-10-CM | POA: Insufficient documentation

## 2011-03-02 DIAGNOSIS — E860 Dehydration: Secondary | ICD-10-CM | POA: Insufficient documentation

## 2011-03-02 DIAGNOSIS — I10 Essential (primary) hypertension: Secondary | ICD-10-CM | POA: Insufficient documentation

## 2011-03-02 HISTORY — DX: Unspecified convulsions: R56.9

## 2011-03-02 LAB — GLUCOSE, CAPILLARY: Glucose-Capillary: 312 mg/dL — ABNORMAL HIGH (ref 70–99)

## 2011-03-02 NOTE — ED Notes (Signed)
Pt hasn't been able to take any of oral meds today-"Can't keep anything down"

## 2011-03-03 LAB — URINALYSIS, ROUTINE W REFLEX MICROSCOPIC
Bilirubin Urine: NEGATIVE
Bilirubin Urine: NEGATIVE
Glucose, UA: >1000 mg/dL — AB
Leukocytes, UA: NEGATIVE
Leukocytes, UA: NEGATIVE
Nitrite: NEGATIVE
Nitrite: NEGATIVE
Protein, ur: 100 mg/dL — AB
Protein, ur: 30 mg/dL — AB
Specific Gravity, Urine: 1.02 (ref 1.005–1.030)
Urobilinogen, UA: 0.2 mg/dL (ref 0.0–1.0)
Urobilinogen, UA: 0.2 mg/dL (ref 0.0–1.0)
pH: 5 (ref 5.0–8.0)

## 2011-03-03 LAB — GLUCOSE, CAPILLARY
Glucose-Capillary: 299 mg/dL — ABNORMAL HIGH (ref 70–99)
Glucose-Capillary: 262 mg/dL — ABNORMAL HIGH (ref 70–99)

## 2011-03-03 LAB — BASIC METABOLIC PANEL
BUN: 33 mg/dL — ABNORMAL HIGH (ref 6–23)
CO2: 25 mEq/L (ref 19–32)
Calcium: 9.1 mg/dL (ref 8.4–10.5)
Chloride: 99 mEq/L (ref 96–112)
Creatinine, Ser: 1.62 mg/dL — ABNORMAL HIGH (ref 0.50–1.10)
GFR calc Af Amer: 38 mL/min — ABNORMAL LOW (ref >90)
GFR calc non Af Amer: 33 mL/min — ABNORMAL LOW (ref >90)
Glucose, Bld: 320 mg/dL — ABNORMAL HIGH (ref 70–99)
Potassium: 4.1 mEq/L (ref 3.5–5.1)
Sodium: 137 mEq/L (ref 135–145)

## 2011-03-03 LAB — URINE MICROSCOPIC-ADD ON

## 2011-03-03 LAB — DIFFERENTIAL
Basophils Absolute: 0 10*3/uL (ref 0.0–0.1)
Basophils Relative: 0 % (ref 0–1)
Eosinophils Absolute: 0 10*3/uL (ref 0.0–0.7)
Eosinophils Relative: 0 % (ref 0–5)
Lymphocytes Relative: 6 % — ABNORMAL LOW (ref 12–46)
Lymphs Abs: 0.4 10*3/uL — ABNORMAL LOW (ref 0.7–4.0)
Monocytes Absolute: 0.2 10*3/uL (ref 0.1–1.0)
Monocytes Relative: 3 % (ref 3–12)
Neutro Abs: 5.3 10*3/uL (ref 1.7–7.7)
Neutrophils Relative %: 91 % — ABNORMAL HIGH (ref 43–77)

## 2011-03-03 LAB — CBC
HCT: 33.4 % — ABNORMAL LOW (ref 36.0–46.0)
Hemoglobin: 11.8 g/dL — ABNORMAL LOW (ref 12.0–15.0)
MCH: 31.4 pg (ref 26.0–34.0)
MCHC: 35.3 g/dL (ref 30.0–36.0)
MCV: 88.8 fL (ref 78.0–100.0)
Platelets: 197 10*3/uL (ref 150–400)
RBC: 3.76 MIL/uL — ABNORMAL LOW (ref 3.87–5.11)
RDW: 12 % (ref 11.5–15.5)
WBC: 5.8 10*3/uL (ref 4.0–10.5)

## 2011-03-03 MED ORDER — ONDANSETRON 8 MG PO TBDP: 8.0000 mg | ORAL_TABLET | Freq: Three times a day (TID) | ORAL | Status: AC | PRN

## 2011-03-03 MED ORDER — SODIUM CHLORIDE 0.9 % IV SOLN
INTRAVENOUS | Status: DC
  Administered 2011-03-03: 07:00:00 via INTRAVENOUS

## 2011-03-03 MED ORDER — DEXTROSE 5 % IV SOLN: 1.0000 g | Freq: Once | INTRAVENOUS | Status: DC

## 2011-03-03 MED ORDER — ONDANSETRON HCL 4 MG/2ML IJ SOLN
4.0000 mg | Freq: Once | INTRAMUSCULAR | Status: AC
  Administered 2011-03-03: 4 mg via INTRAVENOUS
  Filled 2011-03-03: qty 2

## 2011-03-03 MED ORDER — SODIUM CHLORIDE 0.9 % IV BOLUS (SEPSIS)
500.0000 mL | Freq: Once | INTRAVENOUS | Status: AC
  Administered 2011-03-03: 01:00:00 via INTRAVENOUS

## 2011-03-03 MED ORDER — MORPHINE SULFATE 4 MG/ML IJ SOLN
4.0000 mg | Freq: Once | INTRAMUSCULAR | Status: AC
  Administered 2011-03-03: 4 mg via INTRAVENOUS
  Filled 2011-03-03: qty 1

## 2011-03-03 MED ORDER — SODIUM CHLORIDE 0.9 % IV BOLUS (SEPSIS)
500.0000 mL | Freq: Once | INTRAVENOUS | Status: AC
  Administered 2011-03-03: 500 mL via INTRAVENOUS

## 2011-03-03 MED ORDER — SODIUM CHLORIDE 0.9 % IV SOLN: INTRAVENOUS | Status: DC

## 2011-03-03 NOTE — ED Provider Notes (Signed)
History     CSN: [id number]  Arrival date & time 03/02/11  2144   First MD Initiated Contact with Patient 03/02/11 2349      Chief Complaint  Patient presents with  . Nausea  . Emesis    "all day"  . Diarrhea    "all day"    HPI: Patient is a 66 y.o. female presenting with vomiting. The history is provided by the patient.  Emesis  This is a new problem. The current episode started yesterday. The problem occurs more than 10 times per day. The problem has been gradually worsening. The emesis has an appearance of stomach contents. There has been no fever. Associated symptoms include abdominal pain and diarrhea. Pertinent negatives include no chills, no cough, no fever, no myalgias, no sweats and no URI.  Patient reports onset of nausea vomiting and diarrhea yesterday morning. States she has had too numerous to count episodes of vomiting and diarrhea in last 24 hours. Reports generalized abd aches and pain but no specific focal abd pain. Denies UTI symptoms, chest pain, shortness of breath or other associated symptoms. Denies recent illness.  Past Medical History  Diagnosis Date  . Diabetes mellitus   . Seizures   . Hypertension     No past surgical history on file.  No family history on file.  History  Substance Use Topics  . Smoking status: Never Smoker   . Smokeless tobacco: Not on file  . Alcohol Use: No    OB History    Grav Para Term Preterm Abortions TAB SAB Ect Mult Living                  Review of Systems  Constitutional: Negative.  Negative for fever and chills.  HENT: Negative.   Eyes: Negative.   Respiratory: Negative.  Negative for cough.   Cardiovascular: Negative.   Gastrointestinal: Positive for vomiting, abdominal pain and diarrhea.  Genitourinary: Negative.   Musculoskeletal: Negative.  Negative for myalgias.  Skin: Negative.   Neurological: Negative.   Hematological: Negative.   Psychiatric/Behavioral: Negative.     Allergies  Review of  patient's allergies indicates no known allergies.  Home Medications   Current Outpatient Rx  Name Route Sig Dispense Refill  . AMLODIPINE BESYLATE 10 MG PO TABS Oral Take 10 mg by mouth daily.    [name] CARBAMAZEPINE 200 MG PO TABS Oral Take 400 mg by mouth 2 (two) times daily.    [name] CITALOPRAM HYDROBROMIDE 20 MG PO TABS Oral Take 20 mg by mouth daily.    [name] CLONIDINE HCL 0.2 MG PO TABS Oral Take 0.2 mg by mouth 2 (two) times daily.    [name] EZETIMIBE-SIMVASTATIN 10-40 MG PO TABS Oral Take 1 tablet by mouth at bedtime.    . FUROSEMIDE 80 MG PO TABS Oral Take 80 mg by mouth daily.    . INSULIN ASPART PROT & ASPART (70-30) 100 UNIT/ML [place] SUSP Subcutaneous Inject 24 Units into the skin 2 (two) times daily with a meal.    . METOPROLOL SUCCINATE ER 50 MG PO TB24 Oral Take 50 mg by mouth daily. Take with or immediately following a meal.      BP 154/61  Pulse 100  Temp(Src) 97.6 F (36.4 C) (Oral)  Resp 20  SpO2 93%  Physical Exam  Constitutional: She is oriented to person, place, and time. She appears well-developed and well-nourished.  HENT:  Head: Normocephalic and atraumatic.  Eyes: Conjunctivae are normal.  Neck: Neck supple.  Cardiovascular: Normal rate and regular rhythm.   Pulmonary/Chest: Effort normal and breath sounds normal.  Abdominal: Soft. Bowel sounds are normal. There is no tenderness.       No focal abd TTP  Musculoskeletal: Normal range of motion.  Neurological: She is alert and oriented to person, place, and time.  Skin: Skin is warm and dry. Rash noted. Rash is papular. No erythema.  Psychiatric: She has a normal mood and affect.    ED Course  Procedures  Pt reports feeling much better since IVF's and medications.  CBG  now 262 from 312.  Pt has tolerated PO fluids for > 1 hour. Findings and clinical impression discussed w/ pt. Will plan for d/c home w/ medication for nausea and encourage close f/u w/ PCP at [facility]. Pt agreeable w/ plan. I have also discussed pt w/  Dr.Pickering who isalso agreeable w/ plan.  Labs Reviewed  GLUCOSE, CAPILLARY - Abnormal; Notable for the following:    Glucose-Capillary 312 (*)    All other components within normal limits  CBC - Abnormal; Notable for the following:    RBC 3.76 (*)    Hemoglobin 11.8 (*)    HCT 33.4 (*)    All other components within normal limits  DIFFERENTIAL - Abnormal; Notable for the following:    Neutrophils Relative 91 (*)    Lymphocytes Relative 6 (*)    Lymphs Abs 0.4 (*)    All other components within normal limits  BASIC METABOLIC PANEL - Abnormal; Notable for the following:    Glucose, Bld 320 (*)    BUN 33 (*)    Creatinine, Ser 1.62 (*)    GFR calc non Af Amer 33 (*)    GFR calc Af Amer 38 (*)    All other components within normal limits  URINALYSIS, ROUTINE W REFLEX MICROSCOPIC - Abnormal; Notable for the following:    Glucose, UA >1000 (*)    Hgb urine dipstick SMALL (*)    Ketones, ur TRACE (*)    Protein, ur 100 (*)    All other components within normal limits  URINE MICROSCOPIC-ADD ON - Abnormal; Notable for the following:    Squamous Epithelial / LPF MANY (*)    Bacteria, UA FEW (*)    All other components within normal limits  GLUCOSE, CAPILLARY - Abnormal; Notable for the following:    Glucose-Capillary 299 (*)    All other components within normal limits  URINALYSIS, ROUTINE W REFLEX MICROSCOPIC   No results found.   No diagnosis found.    MDM  HPI/PE and clinical findings c/w 1. Vomiting and diarrhea (Resolved) 2  Hyperglycemia (improved) 3. Dehydration 4. Viral syndrome         Jeryl Columbia, NP 03/05/11 [phone]

## 2011-03-03 NOTE — ED Notes (Signed)
Fluid challenge done with ice chips.  Pt tolerated and also tolerated soda.

## 2011-03-06 NOTE — ED Provider Notes (Signed)
Medical screening examination/treatment/procedure(s) were performed by non-physician practitioner and as supervising physician I was immediately available for consultation/collaboration.  Jasper Riling. Alvino Chapel, MD 03/06/11 [phone]

## 2011-12-03 ENCOUNTER — Other Ambulatory Visit: Payer: Self-pay | Admitting: Family Medicine

## 2011-12-03 DIAGNOSIS — Z1231 Encounter for screening mammogram for malignant neoplasm of breast: Secondary | ICD-10-CM

## 2011-12-06 ENCOUNTER — Other Ambulatory Visit: Payer: Self-pay | Admitting: Physician Assistant

## 2011-12-06 ENCOUNTER — Ambulatory Visit
Admission: RE | Admit: 2011-12-06 | Discharge: 2011-12-06 | Disposition: A | Discharge status: Home / Self Care | Payer: Medicare Other | Source: Ambulatory Visit | Attending: Family Medicine | Admitting: Family Medicine

## 2011-12-06 DIAGNOSIS — Z1231 Encounter for screening mammogram for malignant neoplasm of breast: Secondary | ICD-10-CM

## 2012-09-25 ENCOUNTER — Inpatient Hospital Stay (HOSPITAL_COMMUNITY)
Admission: EM | Admit: 2012-09-25 | Discharge: 2012-10-03 | DRG: 100 | Disposition: A | Discharge status: Skilled Nursing Facility | Payer: PRIVATE HEALTH INSURANCE | Attending: Family Medicine | Admitting: Family Medicine

## 2012-09-25 ENCOUNTER — Encounter (HOSPITAL_COMMUNITY): Payer: Self-pay | Admitting: Radiology

## 2012-09-25 ENCOUNTER — Emergency Department (HOSPITAL_COMMUNITY): Payer: PRIVATE HEALTH INSURANCE

## 2012-09-25 DIAGNOSIS — Z794 Long term (current) use of insulin: Secondary | ICD-10-CM

## 2012-09-25 DIAGNOSIS — M6282 Rhabdomyolysis: Secondary | ICD-10-CM

## 2012-09-25 DIAGNOSIS — G9340 Encephalopathy, unspecified: Secondary | ICD-10-CM | POA: Diagnosis present

## 2012-09-25 DIAGNOSIS — D509 Iron deficiency anemia, unspecified: Secondary | ICD-10-CM | POA: Diagnosis not present

## 2012-09-25 DIAGNOSIS — I10 Essential (primary) hypertension: Secondary | ICD-10-CM | POA: Diagnosis present

## 2012-09-25 DIAGNOSIS — L408 Other psoriasis: Secondary | ICD-10-CM | POA: Diagnosis present

## 2012-09-25 DIAGNOSIS — I129 Hypertensive chronic kidney disease with stage 1 through stage 4 chronic kidney disease, or unspecified chronic kidney disease: Secondary | ICD-10-CM | POA: Diagnosis present

## 2012-09-25 DIAGNOSIS — E1142 Type 2 diabetes mellitus with diabetic polyneuropathy: Secondary | ICD-10-CM | POA: Diagnosis present

## 2012-09-25 DIAGNOSIS — G40909 Epilepsy, unspecified, not intractable, without status epilepticus: Principal | ICD-10-CM | POA: Diagnosis present

## 2012-09-25 DIAGNOSIS — E1149 Type 2 diabetes mellitus with other diabetic neurological complication: Secondary | ICD-10-CM | POA: Diagnosis present

## 2012-09-25 DIAGNOSIS — R569 Unspecified convulsions: Secondary | ICD-10-CM | POA: Diagnosis present

## 2012-09-25 DIAGNOSIS — N179 Acute kidney failure, unspecified: Secondary | ICD-10-CM | POA: Diagnosis present

## 2012-09-25 DIAGNOSIS — N183 Chronic kidney disease, stage 3 unspecified: Secondary | ICD-10-CM | POA: Diagnosis present

## 2012-09-25 DIAGNOSIS — G9349 Other encephalopathy: Secondary | ICD-10-CM | POA: Diagnosis present

## 2012-09-25 DIAGNOSIS — Z6841 Body Mass Index (BMI) 40.0 and over, adult: Secondary | ICD-10-CM

## 2012-09-25 DIAGNOSIS — D709 Neutropenia, unspecified: Secondary | ICD-10-CM | POA: Diagnosis not present

## 2012-09-25 DIAGNOSIS — E559 Vitamin D deficiency, unspecified: Secondary | ICD-10-CM | POA: Diagnosis present

## 2012-09-25 DIAGNOSIS — Z79899 Other long term (current) drug therapy: Secondary | ICD-10-CM

## 2012-09-25 DIAGNOSIS — R251 Tremor, unspecified: Secondary | ICD-10-CM

## 2012-09-25 DIAGNOSIS — R000 Tachycardia, unspecified: Secondary | ICD-10-CM | POA: Diagnosis present

## 2012-09-25 DIAGNOSIS — E785 Hyperlipidemia, unspecified: Secondary | ICD-10-CM | POA: Diagnosis present

## 2012-09-25 DIAGNOSIS — R4182 Altered mental status, unspecified: Secondary | ICD-10-CM

## 2012-09-25 DIAGNOSIS — E119 Type 2 diabetes mellitus without complications: Secondary | ICD-10-CM | POA: Diagnosis present

## 2012-09-25 HISTORY — DX: Rhabdomyolysis: M62.82

## 2012-09-25 LAB — GLUCOSE, CAPILLARY: Glucose-Capillary: 255 mg/dL — ABNORMAL HIGH (ref 70–99)

## 2012-09-25 LAB — CBC
HCT: 34 % — ABNORMAL LOW (ref 36.0–46.0)
Hemoglobin: 12.2 g/dL (ref 12.0–15.0)
MCH: 30.7 pg (ref 26.0–34.0)
MCV: 85.4 fL (ref 78.0–100.0)
MCV: 85.6 fL (ref 78.0–100.0)
Platelets: 193 10*3/uL (ref 150–400)
RBC: 4.26 MIL/uL (ref 3.87–5.11)
RBC: 3.97 MIL/uL (ref 3.87–5.11)
RDW: 11.9 % (ref 11.5–15.5)
WBC: 9.2 10*3/uL (ref 4.0–10.5)

## 2012-09-25 LAB — URINALYSIS, ROUTINE W REFLEX MICROSCOPIC
Glucose, UA: >1000 mg/dL — AB
Leukocytes, UA: NEGATIVE
Protein, ur: >300 mg/dL — AB
Specific Gravity, Urine: 1.021 (ref 1.005–1.030)
Urobilinogen, UA: 0.2 mg/dL (ref 0.0–1.0)

## 2012-09-25 LAB — RAPID URINE DRUG SCREEN, HOSP PERFORMED
Barbiturates: NOT DETECTED
Benzodiazepines: NOT DETECTED
Cocaine: NOT DETECTED
Tetrahydrocannabinol: NOT DETECTED

## 2012-09-25 LAB — MRSA PCR SCREENING: MRSA by PCR: NEGATIVE

## 2012-09-25 LAB — COMPREHENSIVE METABOLIC PANEL
ALT: 23 U/L (ref 0–35)
AST: 46 U/L — ABNORMAL HIGH (ref 0–37)
Albumin: 3.2 g/dL — ABNORMAL LOW (ref 3.5–5.2)
Chloride: 92 mEq/L — ABNORMAL LOW (ref 96–112)
Creatinine, Ser: 1.68 mg/dL — ABNORMAL HIGH (ref 0.50–1.10)
Sodium: 134 mEq/L — ABNORMAL LOW (ref 135–145)
Total Bilirubin: 0.9 mg/dL (ref 0.3–1.2)

## 2012-09-25 LAB — URINE MICROSCOPIC-ADD ON

## 2012-09-25 LAB — CK TOTAL AND CKMB (NOT AT ARMC)
CK, MB: 16.6 ng/mL (ref 0.3–4.0)
Relative Index: 1.6 (ref 0.0–2.5)

## 2012-09-25 LAB — CARBAMAZEPINE LEVEL, TOTAL: Carbamazepine Lvl: 4.6 ug/mL (ref 4.0–12.0)

## 2012-09-25 MED ORDER — MORPHINE SULFATE 2 MG/ML IJ SOLN: 1.0000 mg | INTRAMUSCULAR | Status: DC | PRN

## 2012-09-25 MED ORDER — ASPIRIN EC 81 MG PO TBEC
81.0000 mg | DELAYED_RELEASE_TABLET | Freq: Every day | ORAL | Status: DC
  Filled 2012-09-25 (×2): qty 1

## 2012-09-25 MED ORDER — SODIUM CHLORIDE 0.9 % IV SOLN
400.0000 mg | INTRAVENOUS | Status: AC
  Administered 2012-09-25: 400 mg via INTRAVENOUS
  Filled 2012-09-25: qty 40

## 2012-09-25 MED ORDER — CLONIDINE HCL 0.2 MG PO TABS
0.2000 mg | ORAL_TABLET | Freq: Two times a day (BID) | ORAL | Status: DC
  Filled 2012-09-25 (×3): qty 1

## 2012-09-25 MED ORDER — CITALOPRAM HYDROBROMIDE 20 MG PO TABS
20.0000 mg | ORAL_TABLET | Freq: Every day | ORAL | Status: DC
  Filled 2012-09-25 (×2): qty 1

## 2012-09-25 MED ORDER — SODIUM CHLORIDE 0.9 % IJ SOLN
3.0000 mL | Freq: Two times a day (BID) | INTRAMUSCULAR | Status: DC
  Administered 2012-09-28 – 2012-10-02 (×9): 3 mL via INTRAVENOUS

## 2012-09-25 MED ORDER — ACETAMINOPHEN 325 MG PO TABS: 650.0000 mg | ORAL_TABLET | Freq: Four times a day (QID) | ORAL | Status: DC | PRN

## 2012-09-25 MED ORDER — AMLODIPINE BESYLATE 10 MG PO TABS
10.0000 mg | ORAL_TABLET | Freq: Every day | ORAL | Status: DC
  Filled 2012-09-25 (×2): qty 1

## 2012-09-25 MED ORDER — EZETIMIBE-SIMVASTATIN 10-40 MG PO TABS: 1.0000 | ORAL_TABLET | Freq: Every day | ORAL | Status: DC

## 2012-09-25 MED ORDER — ONDANSETRON HCL 4 MG PO TABS: 4.0000 mg | ORAL_TABLET | Freq: Four times a day (QID) | ORAL | Status: DC | PRN

## 2012-09-25 MED ORDER — LORAZEPAM 2 MG/ML IJ SOLN
2.0000 mg | INTRAMUSCULAR | Status: AC
  Administered 2012-09-25: 2 mg via INTRAVENOUS

## 2012-09-25 MED ORDER — LABETALOL HCL 5 MG/ML IV SOLN
10.0000 mg | Freq: Once | INTRAVENOUS | Status: AC
  Administered 2012-09-25: 10 mg via INTRAVENOUS
  Filled 2012-09-25: qty 4

## 2012-09-25 MED ORDER — HYDRALAZINE HCL 20 MG/ML IJ SOLN
10.0000 mg | Freq: Four times a day (QID) | INTRAMUSCULAR | Status: DC | PRN
  Administered 2012-09-25 – 2012-09-26 (×2): 10 mg via INTRAVENOUS
  Filled 2012-09-25 (×2): qty 1

## 2012-09-25 MED ORDER — LORAZEPAM 2 MG/ML IJ SOLN
1.0000 mg | Freq: Four times a day (QID) | INTRAMUSCULAR | Status: DC | PRN
  Administered 2012-09-25 – 2012-09-26 (×2): 1 mg via INTRAVENOUS
  Filled 2012-09-25 (×4): qty 1

## 2012-09-25 MED ORDER — CARBAMAZEPINE 200 MG PO TABS
400.0000 mg | ORAL_TABLET | Freq: Two times a day (BID) | ORAL | Status: DC
  Filled 2012-09-25 (×3): qty 2

## 2012-09-25 MED ORDER — ACETAMINOPHEN 650 MG RE SUPP: 650.0000 mg | Freq: Four times a day (QID) | RECTAL | Status: DC | PRN

## 2012-09-25 MED ORDER — ALBUTEROL SULFATE (5 MG/ML) 0.5% IN NEBU: 2.5000 mg | INHALATION_SOLUTION | RESPIRATORY_TRACT | Status: DC | PRN

## 2012-09-25 MED ORDER — EZETIMIBE-SIMVASTATIN 10-20 MG PO TABS
1.0000 | ORAL_TABLET | Freq: Every day | ORAL | Status: DC
  Filled 2012-09-25 (×2): qty 1

## 2012-09-25 MED ORDER — SODIUM CHLORIDE 0.9 % IV SOLN
Freq: Once | INTRAVENOUS | Status: AC
  Administered 2012-09-25: 15:00:00 via INTRAVENOUS

## 2012-09-25 MED ORDER — INSULIN ASPART 100 UNIT/ML ~~LOC~~ SOLN
0.0000 [IU] | Freq: Three times a day (TID) | SUBCUTANEOUS | Status: DC
  Administered 2012-09-25: 8 [IU] via SUBCUTANEOUS
  Administered 2012-09-26 (×2): 2 [IU] via SUBCUTANEOUS
  Administered 2012-09-26: 3 [IU] via SUBCUTANEOUS

## 2012-09-25 MED ORDER — SODIUM CHLORIDE 0.9 % IV SOLN
200.0000 mg | Freq: Two times a day (BID) | INTRAVENOUS | Status: DC
  Administered 2012-09-25 – 2012-09-29 (×8): 200 mg via INTRAVENOUS
  Filled 2012-09-25 (×16): qty 20

## 2012-09-25 MED ORDER — SODIUM CHLORIDE 0.9 % IV SOLN
INTRAVENOUS | Status: DC
  Administered 2012-09-25: 22:00:00 via INTRAVENOUS
  Administered 2012-09-25: 125 mL/h via INTRAVENOUS
  Administered 2012-09-26 – 2012-09-27 (×2): via INTRAVENOUS

## 2012-09-25 MED ORDER — CARBAMAZEPINE 200 MG PO TABS
200.0000 mg | ORAL_TABLET | Freq: Once | ORAL | Status: DC
  Filled 2012-09-25: qty 1

## 2012-09-25 MED ORDER — PANTOPRAZOLE SODIUM 40 MG IV SOLR
40.0000 mg | INTRAVENOUS | Status: DC
  Administered 2012-09-25 – 2012-09-26 (×2): 40 mg via INTRAVENOUS
  Filled 2012-09-25 (×4): qty 40

## 2012-09-25 MED ORDER — HEPARIN SODIUM (PORCINE) 5000 UNIT/ML IJ SOLN
5000.0000 [IU] | Freq: Three times a day (TID) | INTRAMUSCULAR | Status: DC
  Administered 2012-09-25 – 2012-10-02 (×22): 5000 [IU] via SUBCUTANEOUS
  Filled 2012-09-25 (×26): qty 1

## 2012-09-25 MED ORDER — ONDANSETRON HCL 4 MG/2ML IJ SOLN: 4.0000 mg | Freq: Four times a day (QID) | INTRAMUSCULAR | Status: DC | PRN

## 2012-09-25 MED ORDER — METOPROLOL TARTRATE 50 MG PO TABS
50.0000 mg | ORAL_TABLET | Freq: Two times a day (BID) | ORAL | Status: DC
  Filled 2012-09-25 (×3): qty 1

## 2012-09-25 NOTE — ED Notes (Signed)
Main lab called with panic value ckmb 16.6 total 1450 ed md made aware at this time

## 2012-09-25 NOTE — ED Notes (Signed)
Myself and Heather, EMT undressed pt, placed in gown, on monitor, continuous pulse oximetry and blood pressure cuff; vitals and EKG being performed

## 2012-09-25 NOTE — ED Provider Notes (Signed)
CSN: [id number]     Arrival date & time 09/25/12  1156 History   First MD Initiated Contact with Patient 09/25/12 1209     Chief Complaint  Patient presents with  . Altered Mental Status   (Consider location/radiation/quality/duration/timing/severity/associated sxs/prior Treatment) HPIThe patient was found by her sister, just prior to calling EMS, on the ground. On my exam the patient is awake and alert, and although tremulous, is answering questions appropriately.  She denies pain, lightheadedness, disorientation.  She is unsure of what occurred prior to EMS arrival.  There is a period of amnesia for some time prior to EMS arrival. Per family members the patient was last seen in the 4 days ago in her usual state of health.  Patient lives alone.   .  Past Medical History  Diagnosis Date  . Diabetes mellitus   . Seizures   . Hypertension    History reviewed. No pertinent past surgical history. History reviewed. No pertinent family history. History  Substance Use Topics  . Smoking status: Never Smoker   . Smokeless tobacco: Not on file  . Alcohol Use: No   OB History   Grav Para Term Preterm Abortions TAB SAB Ect Mult Living                 Review of Systems  Constitutional:       Per HPI, otherwise negative  HENT:       Per HPI, otherwise negative  Respiratory:       Per HPI, otherwise negative  Cardiovascular:       Per HPI, otherwise negative  Gastrointestinal: Negative for vomiting.  Endocrine:       Negative aside from HPI  Genitourinary:       Neg aside from HPI   Musculoskeletal:       Per HPI, otherwise negative  Skin: Negative.   Neurological: Positive for tremors, seizures, speech difficulty and numbness. Negative for syncope, facial asymmetry and headaches.    Allergies  Review of patient's allergies indicates no known allergies.  Home Medications   Current Outpatient Rx  Name  Route  Sig  Dispense  Refill  . amLODipine (NORVASC) 10 MG tablet    Oral   Take 10 mg by mouth daily.         . carbamazepine (TEGRETOL) 200 MG tablet   Oral   Take 400 mg by mouth 2 (two) times daily.         . citalopram (CELEXA) 20 MG tablet   Oral   Take 20 mg by mouth daily.         . cloNIDine (CATAPRES) 0.2 MG tablet   Oral   Take 0.2 mg by mouth 2 (two) times daily.         [name] ezetimibe-simvastatin (VYTORIN) 10-40 MG per tablet   Oral   Take 1 tablet by mouth at bedtime.         . furosemide (LASIX) 80 MG tablet   Oral   Take 80 mg by mouth daily.         . insulin aspart protamine-insulin aspart (NOVOLOG 70/30) (70-30) 100 UNIT/ML injection   Subcutaneous   Inject 24 Units into the skin 2 (two) times daily with a meal.         . metoprolol succinate (TOPROL-XL) 50 MG 24 hr tablet   Oral   Take 50 mg by mouth daily. Take with or immediately following a meal.  BP 196/112  Pulse 107  Temp(Src) 98.4 F (36.9 C) (Oral)  Resp 18  SpO2 97% Physical Exam  Nursing note and vitals reviewed. Constitutional: She is oriented to person, place, and time. She appears well-developed and well-nourished. No distress.  Tremulous obese female resting in bed  HENT:  Head: Normocephalic and atraumatic.  Eyes: Conjunctivae and EOM are normal. Right eye exhibits no discharge. Left eye exhibits no discharge.  Cardiovascular: Normal rate and regular rhythm.   Pulmonary/Chest: Effort normal and breath sounds normal. No stridor. No respiratory distress.  Abdominal: She exhibits no distension.  Musculoskeletal: She exhibits no edema.  Neurological: She is alert and oriented to person, place, and time. She displays abnormal reflex. No cranial nerve deficit. She exhibits abnormal muscle tone. Coordination normal.   Patient does have baseline tremor, but follows all commands appropriately.  There is a symmetric lower extremity weakness, that seems baseline.  Diminished lower extremity reflexes  Skin: Skin is warm and dry.   Psychiatric: She has a normal mood and affect.    ED Course  Procedures (including critical care time) Labs Review Labs Reviewed  GLUCOSE, CAPILLARY - Abnormal; Notable for the following:    Glucose-Capillary 264 (*)    All other components within normal limits  CBC  URINALYSIS, ROUTINE W REFLEX MICROSCOPIC  COMPREHENSIVE METABOLIC PANEL  CK TOTAL AND CKMB   Imaging Review No results found. Pulse oximetry 99% with nasal cannula abnormal Cardiac monitor 101 sinus tachycardia abnormal EKG has sinus tachycardia, rate 109, nonspecific T wave changes in significant artifact this is abnormal Update: On repeat exam the patient appears calm   Update: Patient's labs notable for CK greater than 1000.  This is consistent with a prolonged down time. MDM  No diagnosis found. Patient presents after being found unresponsive by family member.  Notably, the patient has history of seizures, and this may explain her event, though on my exam the patient has recovered mental capacity, is appropriately awake and alert.  She does have decreased strength, though this may be baseline.  With her prolonged downtime, there suspicion for early rhabdomyolysis.  Patient also has renal dysfunction chronically.  Patient began to receive fluid rehydration here, was admitted for further evaluation and management.    Carmin Muskrat, MD 09/25/12 [phone]

## 2012-09-25 NOTE — Consult Note (Signed)
NEURO HOSPITALIST CONSULT NOTE    Reason for Consult: Seizure  HPI:                                                                                                                                          [name] is an 67 y.o. female  With known seizure disorder on Tegretol 400 mg BID. The last time family had spoken to her was on Friday (3 days ago).  Per chart, Patients sister found patient on the floor near the kitchen this AM. Per his sister who found her, there was no feces or urine near the patient. There was no tongue bite. Patient apparently recognized her sister. EMS was called, patient was then brought to [facility] for further evaluation and treatment. She was found to have generalized intention tremors, weak and somewhat confused. She was also found to be in acute renal failure, mild rhabdomyolysis with tachycardia and uncontrolled blood pressure. CBG 264, sodium 134, potassium 3.1, Cr 1.68 (last cr on file was in 2013 which was 1.47), CK total 1045, Tegretol level 4.6, UDS (-) and UA (-). Initial CT head was negative for acute stroke, bleed or mass. On consultation patient is awake, shows bilateral facial and appendicular tremor, follows simple commands.  She will start to answer my questions then perseverate or substatuting words with the word "hamberger".   EEG and MRI have been ordered at this time.    Past Medical History  Diagnosis Date  . Diabetes mellitus   . Seizures   . Hypertension     History reviewed. No pertinent past surgical history.  Family History  Problem Relation Age of Onset  . Hypertension Mother   . Hypertension Father      Social History:  reports that she has never smoked. She does not have any smokeless tobacco history on file. She reports that she does not drink alcohol or use illicit drugs.  No Known Allergies  MEDICATIONS:                                                                                                                      Prior to Admission:  Prescriptions prior to admission  Medication Sig Dispense Refill  . amLODipine (NORVASC) 10 MG tablet Take  10 mg by mouth daily.      . carbamazepine (TEGRETOL) 200 MG tablet Take 400 mg by mouth 2 (two) times daily.      . citalopram (CELEXA) 20 MG tablet Take 20 mg by mouth daily.      . cloNIDine (CATAPRES) 0.2 MG tablet Take 0.2 mg by mouth 2 (two) times daily.      [name] ezetimibe-simvastatin (VYTORIN) 10-40 MG per tablet Take 1 tablet by mouth at bedtime.      . furosemide (LASIX) 80 MG tablet Take 160 mg by mouth daily.       . insulin aspart protamine-insulin aspart (NOVOLOG 70/30) (70-30) 100 UNIT/ML injection Inject 24 Units into the skin 2 (two) times daily with a meal.      . metoprolol (LOPRESSOR) 50 MG tablet Take 50 mg by mouth daily.      . naproxen (NAPROSYN) 500 MG tablet Take 500 mg by mouth 2 (two) times daily as needed (for pain).      [name] omeprazole (PRILOSEC) 40 MG capsule Take 40 mg by mouth daily.       Scheduled: . amLODipine  10 mg Oral Daily  . aspirin EC  81 mg Oral Daily  . carbamazepine  400 mg Oral BID  . citalopram  20 mg Oral Daily  . cloNIDine  0.2 mg Oral BID  . ezetimibe-simvastatin  1 tablet Oral QHS  . heparin  5,000 Units Subcutaneous Q8H  . insulin aspart  0-15 Units Subcutaneous TID WC  . metoprolol  50 mg Oral BID  . pantoprazole (PROTONIX) IV  40 mg Intravenous Q24H  . sodium chloride  3 mL Intravenous Q12H     ROS:                                                                                                                                       History obtained from unobtainable from patient due to mental status   Blood pressure 196/112, pulse 107, temperature 98.4 F (36.9 C), temperature source Oral, resp. rate 18, SpO2 97.00%.   Neurologic Examination:                                                                                                      Mental Status: Alert, oriented to  hospital and [facility]. Unable to tell me date, year, and where [place] is located.  While asking questions, she will state "hamburger" randomly. Speech fluent without evidence of aphasia.  Able to follow simple  verbal and viaual commands without difficulty. Cranial Nerves: II: Discs flat bilaterally; blinks to threat bilaterally, pupils equal, round, reactive to light and accommodation III,IV, VI: ptosis not present, extra-ocular motions intact bilaterally V,VII: smile symmetric, facial light touch sensation normal bilaterally VIII: hearing normal bilaterally IX,X: gag reflex present XI: bilateral shoulder shrug XII: midline tongue extension Motor: Right : Upper extremity   5/5    Left:     Upper extremity   5/5  Lower extremity   5/5     Lower extremity   5/5 --patient shows a fine tremor at both rest and with posture.  Tremor is bilateral in arms, legs and face.  Sensory: Pinprick and light touch intact throughout but unable to assess neglect due to mental status Deep Tendon Reflexes:  Right: Upper Extremity   Left: Upper extremity   biceps (C-5 to C-6) 2/4   biceps (C-5 to C-6) 2/4 tricep (C7) 2/4    triceps (C7) 2/4 Brachioradialis (C6) 2/4  Brachioradialis (C6) 2/4  Lower Extremity Lower Extremity  quadriceps (L-2 to L-4) 1/4   quadriceps (L-2 to L-4) 1/4 Achilles (S1) 0/4   Achilles (S1) 0/4  Plantars: Right: downgoing   Left: downgoing Cerebellar: normal finger-to-nose,  normal heel-to-shin test Gait: Not assessed CV: pulses palpable throughout    Lab Results  Component Value Date/Time   CHOL 139 04/03/2010  2:12 AM    Results for orders placed during the hospital encounter of 09/25/12 (from the past 48 hour(s))  GLUCOSE, CAPILLARY     Status: Abnormal   Collection Time    09/25/12 12:26 PM      Result Value Range   Glucose-Capillary 264 (*) 70 - 99 mg/dL  CBC     Status: Abnormal   Collection Time    09/25/12 12:45 PM      Result Value Range   WBC 9.2  4.0 -  10.5 K/uL   RBC 4.26  3.87 - 5.11 MIL/uL   Hemoglobin 13.4  12.0 - 15.0 g/dL   HCT 36.4  36.0 - 46.0 %   MCV 85.4  78.0 - 100.0 fL   MCH 31.5  26.0 - 34.0 pg   MCHC 36.8 (*) 30.0 - 36.0 g/dL   RDW 11.9  11.5 - 15.5 %   Platelets 193  150 - 400 K/uL  COMPREHENSIVE METABOLIC PANEL     Status: Abnormal   Collection Time    09/25/12 12:45 PM      Result Value Range   Sodium 134 (*) 135 - 145 mEq/L   Potassium 3.1 (*) 3.5 - 5.1 mEq/L   Chloride 92 (*) 96 - 112 mEq/L   CO2 23  19 - 32 mEq/L   Glucose, Bld 283 (*) 70 - 99 mg/dL   BUN 17  6 - 23 mg/dL   Creatinine, Ser 1.68 (*) 0.50 - 1.10 mg/dL   Calcium 9.3  8.4 - 10.5 mg/dL   Total Protein 7.3  6.0 - 8.3 g/dL   Albumin 3.2 (*) 3.5 - 5.2 g/dL   AST 46 (*) 0 - 37 U/L   ALT 23  0 - 35 U/L   Alkaline Phosphatase 75  39 - 117 U/L   Total Bilirubin 0.9  0.3 - 1.2 mg/dL   GFR calc non Af Amer 31 (*) >90 mL/min   GFR calc Af Amer 36 (*) >90 mL/min   Comment: (NOTE)     The eGFR has been calculated using the CKD EPI equation.  This calculation has not been validated in all clinical situations.     eGFR's persistently <90 mL/min signify possible Chronic Kidney     Disease.  CK TOTAL AND CKMB     Status: Abnormal   Collection Time    09/25/12 12:45 PM      Result Value Range   Total CK 1045 (*) 7 - 177 U/L   CK, MB 16.6 (*) 0.3 - 4.0 ng/mL   Comment: CRITICAL RESULT CALLED TO, READ BACK BY AND VERIFIED WITH:     OMOHUNDRO JRN 09/25/12 1332 COSTELLO B   Relative Index 1.6  0.0 - 2.5  CARBAMAZEPINE LEVEL, TOTAL     Status: None   Collection Time    09/25/12  2:28 PM      Result Value Range   Carbamazepine Lvl 4.6  4.0 - 12.0 ug/mL  URINALYSIS, ROUTINE W REFLEX MICROSCOPIC     Status: Abnormal   Collection Time    09/25/12  2:40 PM      Result Value Range   Color, Urine AMBER (*) YELLOW   Comment: BIOCHEMICALS MAY BE AFFECTED BY COLOR   APPearance CLEAR  CLEAR   Specific Gravity, Urine 1.021  1.005 - 1.030   pH 5.5  5.0 - 8.0    Glucose, UA >1000 (*) NEGATIVE mg/dL   Hgb urine dipstick MODERATE (*) NEGATIVE   Bilirubin Urine SMALL (*) NEGATIVE   Ketones, ur 15 (*) NEGATIVE mg/dL   Protein, ur >300 (*) NEGATIVE mg/dL   Urobilinogen, UA 0.2  0.0 - 1.0 mg/dL   Nitrite NEGATIVE  NEGATIVE   Leukocytes, UA NEGATIVE  NEGATIVE  URINE RAPID DRUG SCREEN (HOSP PERFORMED)     Status: None   Collection Time    09/25/12  2:40 PM      Result Value Range   Opiates NONE DETECTED  NONE DETECTED   Cocaine NONE DETECTED  NONE DETECTED   Benzodiazepines NONE DETECTED  NONE DETECTED   Amphetamines NONE DETECTED  NONE DETECTED   Tetrahydrocannabinol NONE DETECTED  NONE DETECTED   Barbiturates NONE DETECTED  NONE DETECTED   Comment:            DRUG SCREEN FOR MEDICAL PURPOSES     ONLY.  IF CONFIRMATION IS NEEDED     FOR ANY PURPOSE, NOTIFY LAB     WITHIN 5 DAYS.                LOWEST DETECTABLE LIMITS     FOR URINE DRUG SCREEN     Drug Class       Cutoff (ng/mL)     Amphetamine      1000     Barbiturate      200     Benzodiazepine   [id number]     Tricyclics       [id number]     Opiates          300     Cocaine          300     THC              50  URINE MICROSCOPIC-ADD ON     Status: Abnormal   Collection Time    09/25/12  2:40 PM      Result Value Range   Squamous Epithelial / LPF FEW (*) RARE   WBC, UA 0-2  <3 WBC/hpf   RBC / HPF 3-6  <3 RBC/hpf   Bacteria, UA FEW (*) RARE  Casts GRANULAR CAST (*) NEGATIVE   Comment: HYALINE CASTS    Ct Head Wo Contrast  09/25/2012   *RADIOLOGY REPORT*  Clinical Data: Altered mental status.  CT HEAD WITHOUT CONTRAST  Technique:  Contiguous axial images were obtained from the base of the skull through the vertex without contrast.  Comparison: 10/20/2007  Findings: There is progression of small vessel ischemic changes in the periventricular white matter since the prior study.  Increased prominence of cortical atrophy also noted since 2009. The brain demonstrates no evidence of hemorrhage,  infarction, edema, mass effect, extra-axial fluid collection, hydrocephalus or mass lesion. The skull is unremarkable.  IMPRESSION: No acute findings.  There is evidence of progression of small vessel disease and atrophy since 2009.   Original Report Authenticated By: Aletta Edouard, M.D.   Dg Chest Port 1 View  09/25/2012   CLINICAL DATA:  Shortness of breath, confusion.  EXAM: PORTABLE CHEST - 1 VIEW  COMPARISON:  04/03/2010  FINDINGS: Mild cardiomegaly, vascular congestion. Right base atelectasis. Low lung volumes. No effusions or acute bony abnormality. No overt edema.  IMPRESSION: Cardiomegaly with vascular congestion and right base atelectasis.   Electronically Signed   By: Rolm Baptise   On: 09/25/2012 14:53    Etta Quill PA-C [facility] [phone]  09/25/2012, 4:41 PM   Patient seen and examined.  Clinical course and management discussed.  Necessary edits performed.  I agree with the above.  Assessment and plan of care developed and discussed below.    Assessment/Plan: 67 year old female found down.  Unclear course of events.  Patient now with an altered mental status and unable to give history surrounding events.  Other than rhabdomyolysis patient without metabolic issues to explain mental status.  Patient with a history of seizures.  Tegretol level low therapeutic at 4.6.  With altered mental status can not rule out a nonconvulsive status.  CT of the head reviewed and shows no acute changes.    Recommendations: 1.  Ativan 2mg  now 2.  Vimpat 400mg  IV now with maintenance of 100mg  q 12 hours 3.  Seizure precautions 4.  MRI of the brain 5.  EEG 6.  Labetalol for tachycardia and elevated BP  Case discussed with Dr. Angie Fava, MD [facility] [phone]  09/25/2012  6:49 PM

## 2012-09-25 NOTE — H&P (Signed)
PATIENT DETAILS Name: [name] Age: 67 y.o. Sex: female Date of Birth: Mar 03, 1945 Admit Date: 09/25/2012 [id number], Provider, MD   CHIEF COMPLAINT:  Altered mental status  HPI: RICHIE GARRELL is a 67 y.o. female with a Past Medical History of seizure disorder, diabetes, hypertension, morbid obesity who presents today with the above noted complaint. Please note, patient is still confused, speech is very slow and hard to discern given tremors, although she is following all commands and answers some questions appropriately. In any event, patient  lives by herself, her family had last talked to on the phone on Friday. Her aunt is currently at bedside and, this providing some collaborative history. Per family patient is known to be compliant with her diet. Family members last October on Friday-8/30. On Saturday 8/31, who family tried to contact her but she was not able to be contacted. One of the sisters, finally went to the patient's house today and she was found on the floor near the kitchen. Per his sister who found her, there was no feces or urine near the patient. There was no tongue bite. Patient apparently recognized her sister. EMS was called, patient was then brought to [facility] for further evaluation and treatment. She was found to have generalized intention tremors, weak and somewhat confused. She was also found to be in acute renal failure, mild rhabdomyolysis with tachycardia and uncontrolled blood pressure. I was asked to admit this patient for further evaluation and treatment. During my evaluation, patient appears slightly lethargic but awake, alert at times. She seemed to be somewhat confused, although is following commands. Upon asking her, whether or not she is taking her medications is not able to give me a clear answer, her sister and aunt at bedside claimed that she takes her medications very regularly. At times, it appeared that the patient was claiming that she last  took her Tegretol a few weeks ago, however she is such a poor historian at this point in time. Family was not aware of any recent nausea vomiting or diarrhea. There is no history of fever per family.   ALLERGIES:  No Known Allergies  PAST MEDICAL HISTORY: Past Medical History  Diagnosis Date  . Diabetes mellitus   . Seizures   . Hypertension     PAST SURGICAL HISTORY: History reviewed. No pertinent past surgical history.  MEDICATIONS AT HOME: Prior to Admission medications   Medication Sig Start Date End Date Taking? Authorizing Provider  amLODipine (NORVASC) 10 MG tablet Take 10 mg by mouth daily.   Yes Historical Provider, MD  carbamazepine (TEGRETOL) 200 MG tablet Take 400 mg by mouth 2 (two) times daily.   Yes Historical Provider, MD  citalopram (CELEXA) 20 MG tablet Take 20 mg by mouth daily.   Yes Historical Provider, MD  cloNIDine (CATAPRES) 0.2 MG tablet Take 0.2 mg by mouth 2 (two) times daily.   Yes Historical Provider, MD  ezetimibe-simvastatin (VYTORIN) 10-40 MG per tablet Take 1 tablet by mouth at bedtime.   Yes Historical Provider, MD  furosemide (LASIX) 80 MG tablet Take 160 mg by mouth daily.    Yes Historical Provider, MD  insulin aspart protamine-insulin aspart (NOVOLOG 70/30) (70-30) 100 UNIT/ML injection Inject 24 Units into the skin 2 (two) times daily with a meal.   Yes Historical Provider, MD  metoprolol (LOPRESSOR) 50 MG tablet Take 50 mg by mouth daily.   Yes Historical Provider, MD  naproxen (NAPROSYN) 500 MG tablet Take 500 mg by mouth  2 (two) times daily as needed (for pain).   Yes Historical Provider, MD  omeprazole (PRILOSEC) 40 MG capsule Take 40 mg by mouth daily.   Yes Historical Provider, MD    FAMILY HISTORY: History reviewed. No pertinent family history.  SOCIAL HISTORY:  reports that she has never smoked. She does not have any smokeless tobacco history on file. She reports that she does not drink alcohol or use illicit drugs.  REVIEW OF  SYSTEMS:  Constitutional:   No  weight loss, night sweats,  Fevers, chills, fatigue.  HEENT:    No headaches, Difficulty swallowing,Tooth/dental problems,Sore throat,  No sneezing, itching, ear ache, nasal congestion, post nasal drip,   Cardio-vascular: No chest pain,  Orthopnea, PND, swelling in lower extremities, anasarca,  dizziness, palpitations  GI:  No heartburn, indigestion, abdominal pain, nausea, vomiting, diarrhea, change in  bowel habits, loss of appetite  Resp: No shortness of breath with exertion or at rest.  No excess mucus, no productive cough, No non-productive cough,  No coughing up of blood.No change in color of mucus.No wheezing.No chest wall deformity  Skin:  Does have chronic psoriasis lesions under her breasts and in her bilateral  GU:  no dysuria, change in color of urine, no urgency or frequency.  No flank pain.  Musculoskeletal: No joint pain or swelling.  No decreased range of motion.  No back pain.  Psych: No change in mood or affect. No depression or anxiety.  No memory loss.   PHYSICAL EXAM: Blood pressure 196/112, pulse 107, temperature 98.4 F (36.9 C), temperature source Oral, resp. rate 18, SpO2 97.00%.  General appearance :Awake, somewhat alert, not in any distress. Follows commands. OBs HEENT: Atraumatic and Normocephalic, pupils equally reactive to light and accomodation Neck: supple, no JVD. No cervical lymphadenopathy.  Chest:Good air entry bilaterally, no added sounds  CVS: S1 S2 regular, tachycardic. Abdomen: Bowel sounds present, Non tender and not distended with no gaurding, rigidity or rebound. Obese abdomen Extremities: B/L Lower Ext shows no edema, both legs are warm to touch. Neurology: Difficult exam, but she moves all 4 extremities. Mostly intention tremor present.  Skin:No Rash Wounds:N/A  LABS ON ADMISSION:   Recent Labs  09/25/12 1245  NA 134*  K 3.1*  CL 92*  CO2 23  GLUCOSE 283*  BUN 17  CREATININE 1.68*   CALCIUM 9.3    Recent Labs  09/25/12 1245  AST 46*  ALT 23  ALKPHOS 75  BILITOT 0.9  PROT 7.3  ALBUMIN 3.2*   No results found for this basename: LIPASE, AMYLASE,  in the last 72 hours  Recent Labs  09/25/12 1245  WBC 9.2  HGB 13.4  HCT 36.4  MCV 85.4  PLT 193    Recent Labs  09/25/12 1245  CKTOTAL 1045*  CKMB 16.6*   No results found for this basename: DDIMER,  in the last 72 hours No components found with this basename: POCBNP,    RADIOLOGIC STUDIES ON ADMISSION: Ct Head Wo Contrast  09/25/2012   *RADIOLOGY REPORT*  Clinical Data: Altered mental status.  CT HEAD WITHOUT CONTRAST  Technique:  Contiguous axial images were obtained from the base of the skull through the vertex without contrast.  Comparison: 10/20/2007  Findings: There is progression of small vessel ischemic changes in the periventricular white matter since the prior study.  Increased prominence of cortical atrophy also noted since 2009. The brain demonstrates no evidence of hemorrhage, infarction, edema, mass effect, extra-axial fluid collection, hydrocephalus or mass lesion.  The skull is unremarkable.  IMPRESSION: No acute findings.  There is evidence of progression of small vessel disease and atrophy since 2009.   Original Report Authenticated By: Aletta Edouard, M.D.   EKG: Independently reviewed. Sinus tachycardia with a lot of artifacts  ASSESSMENT AND PLAN: Present on Admission:  . Altered mental status - Suspect toxic metabolite encephalopathy-from metabolic derangements and also from being post ictal  -Fortunately CT head on admission negative, will check a UA, urinary drug screen and chest x-ray - Likely will need a MRI brain, hold off on ordering an EEG to seen by neurology - resume Tegretol, will also check a Tegretol level.   . ARF (acute renal failure) - Suspect this is multifactorial from mild rhabdomyolysis, also diuretic therapy and nonsteroidal anti-inflammatory medications likely  playing a role - Hydrate, and recheck electrolytes - If renal failure does not improve, we'll then do further workup and obtain a nephrology consultation  . Rhabdomyolysis  - Likely either secondary to seizures, or from being immobile on the ground for a prolonged period of time  - Hydrate and recheck CK tomorrow   . Tremor - Suspect this is from metabolic derangements  - MRI brain pending  - Await neurology input   . Known history of seizure disorder - At this time, presentation is highly suggestive of breakthrough seizures, resulting in post ictal state and confusion - Not entirely convinced at this time, the patient is compliant with her medications, although family claims she is very compliant with her medications. - Resume Tegretol, checking a Tegretol level - Neurology consultation pending  . Uncontrolled hypertension - Resume usual antihypertensive medications - As needed hydralazine  . DIABETES MELLITUS, TYPE II - We'll hold his usual dosing of insulin 70/30 for now, we'll see how patient does with diet and then resume her usual insulin regimen, for now place on sliding scale insulin   . HYPERLIPIDEMIA - Resume statins when CK values better   . Morbid obesity - Counsel importance of weight loss once encephalopathy resolves   Further plan will depend as patient's clinical course evolves and further radiologic and laboratory data become available. Patient will be monitored closely.  DVT Prophylaxis: Prophylactic  Heparin  Code Status: Full Code  Total time spent for admission equals 45 minutes.  [facility] Pager [phone]  If 7PM-7AM, please contact night-coverage [email or web address] Password [email or web address] 09/25/2012, 2:40 PM

## 2012-09-25 NOTE — Progress Notes (Signed)
Admitted from ED, confused, no complaints.

## 2012-09-25 NOTE — ED Notes (Signed)
Pt presents with altered mental status. Pt was last seen normal on Thursday when she talked to her sister on Thursday. Sister came to visit patient today and patient was found on the floor with altered mental status. Per EMS pt was incontinent with post dictal state. Pt presents with tremors.

## 2012-09-25 NOTE — ED Notes (Signed)
MD at bedside. 

## 2012-09-26 ENCOUNTER — Inpatient Hospital Stay (HOSPITAL_COMMUNITY): Payer: PRIVATE HEALTH INSURANCE

## 2012-09-26 DIAGNOSIS — G9340 Encephalopathy, unspecified: Secondary | ICD-10-CM

## 2012-09-26 DIAGNOSIS — M6282 Rhabdomyolysis: Secondary | ICD-10-CM

## 2012-09-26 DIAGNOSIS — R7989 Other specified abnormal findings of blood chemistry: Secondary | ICD-10-CM

## 2012-09-26 DIAGNOSIS — N179 Acute kidney failure, unspecified: Secondary | ICD-10-CM

## 2012-09-26 DIAGNOSIS — R259 Unspecified abnormal involuntary movements: Secondary | ICD-10-CM

## 2012-09-26 LAB — COMPREHENSIVE METABOLIC PANEL
ALT: 26 U/L (ref 0–35)
Alkaline Phosphatase: 62 U/L (ref 39–117)
CO2: 27 mEq/L (ref 19–32)
GFR calc Af Amer: 43 mL/min — ABNORMAL LOW (ref >90)
Glucose, Bld: 184 mg/dL — ABNORMAL HIGH (ref 70–99)
Potassium: 3.7 mEq/L (ref 3.5–5.1)
Sodium: 138 mEq/L (ref 135–145)
Total Protein: 6.8 g/dL (ref 6.0–8.3)

## 2012-09-26 LAB — GLUCOSE, CAPILLARY
Glucose-Capillary: 116 mg/dL — ABNORMAL HIGH (ref 70–99)
Glucose-Capillary: 118 mg/dL — ABNORMAL HIGH (ref 70–99)
Glucose-Capillary: 124 mg/dL — ABNORMAL HIGH (ref 70–99)
Glucose-Capillary: 158 mg/dL — ABNORMAL HIGH (ref 70–99)
Glucose-Capillary: 170 mg/dL — ABNORMAL HIGH (ref 70–99)

## 2012-09-26 LAB — TSH: TSH: 1.067 u[IU]/mL (ref 0.350–4.500)

## 2012-09-26 LAB — HEMOGLOBIN A1C: Mean Plasma Glucose: 157 mg/dL — ABNORMAL HIGH (ref <117)

## 2012-09-26 LAB — CBC
Hemoglobin: 11.8 g/dL — ABNORMAL LOW (ref 12.0–15.0)
MCHC: 35.5 g/dL (ref 30.0–36.0)
RBC: 3.87 MIL/uL (ref 3.87–5.11)

## 2012-09-26 LAB — VITAMIN B12: Vitamin B-12: 769 pg/mL (ref 211–911)

## 2012-09-26 MED ORDER — LABETALOL HCL 5 MG/ML IV SOLN
INTRAVENOUS | Status: AC
  Filled 2012-09-26: qty 4

## 2012-09-26 MED ORDER — LORAZEPAM 2 MG/ML IJ SOLN
INTRAMUSCULAR | Status: AC
  Filled 2012-09-26: qty 1

## 2012-09-26 MED ORDER — LABETALOL HCL 5 MG/ML IV SOLN
5.0000 mg | Freq: Once | INTRAVENOUS | Status: AC
  Administered 2012-09-26: 5 mg via INTRAVENOUS

## 2012-09-26 MED ORDER — CLONIDINE HCL 0.2 MG/24HR TD PTWK
0.2000 mg | MEDICATED_PATCH | TRANSDERMAL | Status: DC
  Administered 2012-09-26: 0.2 mg via TRANSDERMAL
  Filled 2012-09-26: qty 1

## 2012-09-26 MED ORDER — CARBAMAZEPINE 100 MG PO CHEW
400.0000 mg | CHEWABLE_TABLET | Freq: Two times a day (BID) | ORAL | Status: DC
  Administered 2012-09-26 – 2012-09-28 (×5): 400 mg via ORAL
  Filled 2012-09-26 (×6): qty 4

## 2012-09-26 MED ORDER — INSULIN ASPART 100 UNIT/ML ~~LOC~~ SOLN
0.0000 [IU] | SUBCUTANEOUS | Status: DC
  Administered 2012-09-27: 2 [IU] via SUBCUTANEOUS
  Filled 2012-09-26: qty 0.15

## 2012-09-26 MED ORDER — METOPROLOL TARTRATE 1 MG/ML IV SOLN
5.0000 mg | INTRAVENOUS | Status: DC
  Administered 2012-09-26 – 2012-09-27 (×4): 5 mg via INTRAVENOUS
  Filled 2012-09-26 (×9): qty 5

## 2012-09-26 MED ORDER — METOPROLOL TARTRATE 1 MG/ML IV SOLN
5.0000 mg | Freq: Four times a day (QID) | INTRAVENOUS | Status: DC
  Administered 2012-09-26 (×2): 5 mg via INTRAVENOUS
  Filled 2012-09-26: qty 5

## 2012-09-26 MED ORDER — METOPROLOL TARTRATE 1 MG/ML IV SOLN
INTRAVENOUS | Status: AC
  Administered 2012-09-26: 5 mg via INTRAVENOUS
  Filled 2012-09-26: qty 5

## 2012-09-26 MED ORDER — LORAZEPAM 2 MG/ML IJ SOLN
1.0000 mg | Freq: Once | INTRAMUSCULAR | Status: AC
  Administered 2012-09-26: 1 mg via INTRAVENOUS

## 2012-09-26 NOTE — Progress Notes (Signed)
Down to MRI with nurse, patient moving head and body while  procedure being done.  MRI tech states patient needs sedation for adequate MRI.  Dr. Wynelle Cleveland notified orders received and carried out. Still restless while scan being done. Back to room at 1445.  Now resting. No complaints.

## 2012-09-26 NOTE — Procedures (Signed)
ELECTROENCEPHALOGRAM REPORT   Patient: [name]       Room #: [id number]  Age: 67 y.o.        Sex: female Referring Physician: Rizwan Report Date:  09/26/2012        Interpreting Physician: Alexis Goodell D  History: [name] is an 67 y.o. female admitted after being found down.  Now with altered mental status.  Medications:  Scheduled: . carbamazepine  400 mg Oral BID  . cloNIDine  0.2 mg Transdermal Weekly  . heparin  5,000 Units Subcutaneous Q8H  . insulin aspart  0-15 Units Subcutaneous TID WC  . lacosamide (VIMPAT) IV  200 mg Intravenous Q12H  . LORazepam      . metoprolol  5 mg Intravenous Q4H  . pantoprazole (PROTONIX) IV  40 mg Intravenous Q24H  . sodium chloride  3 mL Intravenous Q12H    Conditions of Recording:  This is a 16 channel EEG carried out with the patient in the lethargic state.  Description:  Artifact predominates the tracing.  On rare occasions a background activity can be evaluated.  During these periods of time the background activity consists of a poorly organized theta rhythm.  On some occasions there also appears to be some evidence of early sleep with symmetrical sleep spindles noted.   Hyperventilation and iIntermittent photic stimulation were not performed.  IMPRESSION: This EEG is dominated by slow activity that is most consistent with drowse and sleep.  No epileptiform activity is noted.     Alexis Goodell, MD [facility] [phone] 09/26/2012, 7:00 PM

## 2012-09-26 NOTE — Progress Notes (Signed)
Portable EEG completed

## 2012-09-26 NOTE — Progress Notes (Signed)
[facility] Progress Note [place] TEAM 1 - Stepdown/ICU TEAM   BREYA PAM [id number] DOB: [birth year] DOA: 09/25/2012 PCP: Default, Provider, MD  Brief narrative: Please see HPI for details. Pt's mental status unchanged since admission yesterday. Restless. Had to be given Ativan for MRI. No family in room.    Assessment/Plan: Principal Problem:   Acute encephalopathy  - possibly post ictal s/p seizure- neuro following- discussed plan with Dr Doy Mince - possible infection with low grade fevers but will not give antibiotics for now as no source is found.  - Ativan PRN  Active Problems:    SEIZURE DISORDER - IV Vimpat per neuro - using crushable PO Carbamazepine as no IV equivalent available- nurse insured that she swallowedd it this AM - f/u MRI and EEG - Using Ativan PRN for restlessness may control further seizures    Rhabdomyolysis - cont IV hydration - repeat labs reveal improvement  Elevated troponin - very mild- possibly related to rhabdo and also improving    CKD (chronic kidney disease) stage 3, GFR 30-59 ml/min - Cr has really not increase much from baseline- has been 1.62 and 1.68 this year.     Tremor - per neuro this is improving    DIABETES MELLITUS, TYPE II - holding 70/30- on sliding scale for now    Morbid obesity    HYPERTENSION, BENIGN - Clonidine patch and IV Metoprolol for now (just increased to Every 4 hrs) - pocketing food- safer to use IV for now due to unpredictable PO intake  Code Status: full code Family Communication: none Disposition Plan: follow in SDU  Consultants: Neuro  Procedures: none  Antibiotics: none  DVT prophylaxis: Heparin  HPI/Subjective: Pt did not communicate with me when I was in the room. I opened her eyelids but she did not respond- just after I left the room, she told the RN that she needed a bedpan.    Objective: Blood pressure 156/95, pulse 96, temperature 99.1 F (37.3 C), temperature  source Axillary, resp. rate 23, weight 110.6 kg (243 lb 13.3 oz), SpO2 94.00%.  Intake/Output Summary (Last 24 hours) at 09/26/12 1726 Last data filed at 09/26/12 1600  Gross per 24 hour  Intake   2920 ml  Output      0 ml  Net   2920 ml     Exam: General: confused to time and place, No acute respiratory distress Lungs: Clear to auscultation bilaterally without wheezes or crackles Cardiovascular: Regular rate and rhythm without murmur gallop or rub normal S1 and S2 Abdomen: Nontender, nondistended, soft, bowel sounds positive, no rebound, no ascites, no appreciable mass Extremities: No significant cyanosis, clubbing, or edema bilateral lower extremities Neuro: 5/5 stregth in all extremities, CN 2-12 intact, PERRLA  Data Reviewed: Basic Metabolic Panel:  Recent Labs Lab 09/25/12 1245 09/25/12 1700 09/26/12 0450  NA 134*  --  138  K 3.1*  --  3.7  CL 92*  --  99  CO2 23  --  27  GLUCOSE 283*  --  184*  BUN 17  --  14  CREATININE 1.68* 1.64* 1.43*  CALCIUM 9.3  --  8.8   Liver Function Tests:  Recent Labs Lab 09/25/12 1245 09/26/12 0450  AST 46* 54*  ALT 23 26  ALKPHOS 75 62  BILITOT 0.9 0.8  PROT 7.3 6.8  ALBUMIN 3.2* 2.9*   No results found for this basename: LIPASE, AMYLASE,  in the last 168 hours No results found for this basename: AMMONIA,  in the last 168 hours CBC:  Recent Labs Lab 09/25/12 1245 09/25/12 1700 09/26/12 0450  WBC 9.2 9.5 7.7  HGB 13.4 12.2 11.8*  HCT 36.4 34.0* 33.2*  MCV 85.4 85.6 85.8  PLT 193 191 199   Cardiac Enzymes:  Recent Labs Lab 09/25/12 1245 09/25/12 1621 09/25/12 2205 09/26/12 0450 09/26/12 0457  CKTOTAL 1045*  --   --  962*  --   CKMB 16.6*  --   --   --   --   TROPONINI  --  0.85* 0.92*  --  0.66*   BNP (last 3 results) No results found for this basename: PROBNP,  in the last 8760 hours CBG:  Recent Labs Lab 09/26/12 0029 09/26/12 0501 09/26/12 0746 09/26/12 1201 09/26/12 1652  GLUCAP 154* 174*  170* 124* 138*    Recent Results (from the past 240 hour(s))  MRSA PCR SCREENING     Status: None   Collection Time    09/25/12  4:26 PM      Result Value Range Status   MRSA by PCR NEGATIVE  NEGATIVE Final   Comment:            The GeneXpert MRSA Assay (FDA     approved for NASAL specimens     only), is one component of a     comprehensive MRSA colonization     surveillance program. It is not     intended to diagnose MRSA     infection nor to guide or     monitor treatment for     MRSA infections.     Studies:  Recent x-ray studies have been reviewed in detail by the Attending Physician  Scheduled Meds:  Scheduled Meds: . carbamazepine  400 mg Oral BID  . cloNIDine  0.2 mg Transdermal Weekly  . heparin  5,000 Units Subcutaneous Q8H  . insulin aspart  0-15 Units Subcutaneous TID WC  . lacosamide (VIMPAT) IV  200 mg Intravenous Q12H  . LORazepam      . metoprolol  5 mg Intravenous Q4H  . pantoprazole (PROTONIX) IV  40 mg Intravenous Q24H  . sodium chloride  3 mL Intravenous Q12H   Continuous Infusions: . sodium chloride 125 mL/hr at 09/26/12 [id number]    Time spent on care of this patient: 29 min   [place], MD  [facility] Office  [phone] Pager - Text Page per Shea Evans as per below:  On-Call/Text Page:      [email or web address]      password [email or web address]  If 7PM-7AM, please contact night-coverage [email or web address] Password [email or web address] 09/26/2012, 5:26 PM   LOS: 1 day

## 2012-09-26 NOTE — Progress Notes (Signed)
NEURO HOSPITALIST PROGRESS NOTE   SUBJECTIVE:                                                                                                                        Patient continues to show tremulous activity intermittently but less than prior exam. Cognition shows no improvement and will perseverate on words mentioned to her. Is now in restraints.  OBJECTIVE:                                                                                                                           Vital signs in last 24 hours: Temp:  [99.1 F (37.3 C)-100.6 F (38.1 C)] 99.5 F (37.5 C) (09/02 0700) Pulse Rate:  [99-121] 101 (09/02 0801) Resp:  [17-27] 25 (09/02 0801) BP: (141-196)/(67-95) 163/74 mmHg (09/02 0800) SpO2:  [94 %-100 %] 95 % (09/02 0801) Weight:  [110.6 kg (243 lb 13.3 oz)] 110.6 kg (243 lb 13.3 oz) (09/02 0418)  Intake/Output from previous day: 09/01 0701 - 09/02 0700 In: 1920 [I.V.:1875; IV Piggyback:45] Out: -  Intake/Output this shift: Total I/O In: 125 [I.V.:125] Out: -  Nutritional status: NPO  Past Medical History  Diagnosis Date  . Diabetes mellitus   . Seizures   . Hypertension      Neurologic Exam:  Mental Status: Alert, oriented to hospital but after she answers this question she is unable to tell me her age or date.  When I told her it is September, she will perseverate on the word "september". Speech clear and she is able to intermittently follow verbal commands.   Cranial Nerves: II: Visual fields grosslyintact, pupils equal, round, reactive to light and accommodation III,IV, VI: ptosis not present, extra-ocular motions intact bilaterally V,VII: smile symmetric, facial light touch sensation normal bilaterally VIII: hearing normal bilaterally IX,X: gag reflex present XI: bilateral shoulder shrug XII: midline tongue extension Motor: Right : Upper extremity   5/5    Left:     Upper extremity   5/5  Lower extremity    5/5     Lower extremity   5/5 --continues to show fine tremor in UE> LE and in face--less tremulous than yesterday Sensory: Pinprick and light touch intact throughout, bilaterally Deep Tendon Reflexes:  Right: Upper Extremity   Left: Upper extremity   biceps (C-5 to C-6) 2/4   biceps (C-5 to C-6) 2/4 tricep (C7) 2/4    triceps (C7) 2/4 Brachioradialis (C6) 2/4  Brachioradialis (C6) 2/4  Lower Extremity Lower Extremity  quadriceps (L-2 to L-4) 1/4   quadriceps (L-2 to L-4) 1/4 Achilles (S1) 0/4   Achilles (S1) 0/4  Plantars: Right: downgoing   Left: downgoing    Lab Results: Lab Results  Component Value Date/Time   CHOL 139 04/03/2010  2:12 AM   Lipid Panel No results found for this basename: CHOL, TRIG, HDL, CHOLHDL, VLDL, LDLCALC,  in the last 72 hours  Studies/Results: Ct Head Wo Contrast  09/25/2012   *RADIOLOGY REPORT*  Clinical Data: Altered mental status.  CT HEAD WITHOUT CONTRAST  Technique:  Contiguous axial images were obtained from the base of the skull through the vertex without contrast.  Comparison: 10/20/2007  Findings: There is progression of small vessel ischemic changes in the periventricular white matter since the prior study.  Increased prominence of cortical atrophy also noted since 2009. The brain demonstrates no evidence of hemorrhage, infarction, edema, mass effect, extra-axial fluid collection, hydrocephalus or mass lesion. The skull is unremarkable.  IMPRESSION: No acute findings.  There is evidence of progression of small vessel disease and atrophy since 2009.   Original Report Authenticated By: Aletta Edouard, M.D.   Dg Chest Port 1 View  09/25/2012   CLINICAL DATA:  Shortness of breath, confusion.  EXAM: PORTABLE CHEST - 1 VIEW  COMPARISON:  04/03/2010  FINDINGS: Mild cardiomegaly, vascular congestion. Right base atelectasis. Low lung volumes. No effusions or acute bony abnormality. No overt edema.  IMPRESSION: Cardiomegaly with vascular congestion and right  base atelectasis.   Electronically Signed   By: Rolm Baptise   On: 09/25/2012 14:53    MEDICATIONS                                                                                                                        Scheduled: . carbamazepine  400 mg Oral BID  . cloNIDine  0.2 mg Transdermal Weekly  . heparin  5,000 Units Subcutaneous Q8H  . insulin aspart  0-15 Units Subcutaneous TID WC  . lacosamide (VIMPAT) IV  200 mg Intravenous Q12H  . metoprolol  5 mg Intravenous Q6H  . pantoprazole (PROTONIX) IV  40 mg Intravenous Q24H  . sodium chloride  3 mL Intravenous Q12H    ASSESSMENT/PLAN:  67 year old female found down. Unclear course of events. Patient now with an altered mental status and unable to give history surrounding events. Other than rhabdomyolysis patient without metabolic issues to explain mental status. Patient with a history of seizures. Patient has received 400 mg IV load of Vimpat and two doses of 200 mg Vimpat with no significant improvement in mental status.  EEG reviewed and shows no epileptiform activity.  Patietn remains to have a low grade fever, tachycardia and elevated BP. Troponin are trending down (.085,.092,.066) MRI brain pending. TSH, B12 and folate WNL.   Plan: 1) Continue home dose of tegretol 2) Continue Vimpat 200 mg BID 3) Will follow up MRI.     Etta Quill PA-C [facility] [phone]  09/26/2012, 12:06 PM   Patient seen and examined.  Clinical course and management discussed.  Necessary edits performed.  I agree with the above.  Assessment and plan of care developed and discussed below.    Alexis Goodell, MD [facility] [phone]  09/26/2012  4:52 PM

## 2012-09-26 NOTE — Evaluation (Signed)
Physical Therapy Evaluation Patient Details Name: [name] MRN: [id number] DOB: 1945/06/25 Today's Date: 09/26/2012 Time: [id number] PT Time Calculation (min): 9 min  PT Assessment / Plan / Recommendation History of Present Illness  67 y.o. female admitted to [facility] on 09/25/12 with a Past Medical History of seizure disorder, diabetes, hypertension, morbid obesity was found on the floor near the kitchen. Per his sister who found her, there was no feces or urine near the patient. There was no tongue bite. Patient apparently recognized her sister. EMS was called, patient was then brought to [facility] for further evaluation and treatment. She was found to have generalized intention tremors, weak and somewhat confused. She was also found to be in acute renal failure, mild rhabdomyolysis with tachycardia and uncontrolled blood pressure.   Clinical Impression  Bed level assessment preformed.  Pt's granddaughter provided prior level of function history.  In general, pt was very independent PTA requiring assist for lower body bathing 2 times per week from her daughter.  She used a walker intermittently, but not always.  She has no ROM deficits, but is still sedated after going to MRI earlier with ativan.  PT will f/u tomorrow to see if we can mobilize the pt and update goals and d/c recs.      PT Assessment  Patient needs continued PT services    Follow Up Recommendations  Other (comment) (TBD once further assessment possible)    Does the patient have the potential to tolerate intense rehabilitation     NA  Barriers to Discharge Decreased caregiver support family available intermittently    Equipment Recommendations  Other (comment) (TBD once further assessment possible.  )    Recommendations for Other Services   None yet  Frequency Min 3X/week    Precautions / Restrictions Precautions Precautions: Fall   Pertinent Vitals/Pain See vitals flow sheet.      Mobility  Bed  Mobility Bed Mobility: Not assessed (pt is unarousable due to ativan for MRI) Transfers Transfers: Not assessed (pt is unarousable due to ativan for MRI) Ambulation/Gait Ambulation/Gait Assistance: Not tested (comment) (pt is unarousable due to ativan for MRI)        PT Diagnosis: Difficulty walking;Abnormality of gait;Generalized weakness;Altered mental status  PT Problem List: Decreased strength;Decreased activity tolerance;Decreased balance;Decreased mobility;Decreased cognition;Decreased knowledge of use of DME;Obesity PT Treatment Interventions: DME instruction;Gait training;Stair training;Functional mobility training;Therapeutic activities;Therapeutic exercise;Balance training;Neuromuscular re-education;Cognitive remediation;Patient/family education;Wheelchair mobility training     PT Goals(Current goals can be found in the care plan section) Acute Rehab PT Goals Patient Stated Goal: granddaughter states family wants to know what is wrong with her.  Get better and get back home PT Goal Formulation: With family Time For Goal Achievement: 10/10/12 Potential to Achieve Goals: Good  Visit Information  Last PT Received On: 09/26/12 Assistance Needed: +2 (potentially) History of Present Illness: 67 y.o. female admitted to [facility] on 09/25/12 with a Past Medical History of seizure disorder, diabetes, hypertension, morbid obesity was found on the floor near the kitchen. Per his sister who found her, there was no feces or urine near the patient. There was no tongue bite. Patient apparently recognized her sister. EMS was called, patient was then brought to [facility] for further evaluation and treatment. She was found to have generalized intention tremors, weak and somewhat confused. She was also found to be in acute renal failure, mild rhabdomyolysis with tachycardia and uncontrolled blood pressure.        Prior [place]  Living Family/patient expects to be discharged to::  Private residence Living Arrangements: Alone Available Help at Discharge: Family;Available PRN/intermittently Type of Home: House Home Access: Level entry Home Layout: One level Home Equipment: Walker - standard;Shower seat Additional Comments: daughter helped her 2 times a week with bathing because pt had a hard time getting to her feet Prior Function Level of Independence: Independent with assistive device(s) Comments: used walker PRN Communication Communication: Other (comment) (difficult to assess due to level of arousal)    Cognition  Cognition Arousal/Alertness: Lethargic Behavior During Therapy:  (lethargic due to ativan) Overall Cognitive Status: Impaired/Different from baseline Area of Impairment: Orientation;Attention;Memory Orientation Level: Place;Time;Situation Current Attention Level:  (none) Memory: Decreased short-term memory General Comments: per granddaughter when she is awake she has been able to recognize family, but cannot remeber where she is or why she is here.      Extremity/Trunk Assessment Upper Extremity Assessment Upper Extremity Assessment: Difficult to assess due to impaired cognition (ROM throughout WNL) Lower Extremity Assessment Lower Extremity Assessment: Difficult to assess due to impaired cognition (ROM throughout WNL) Cervical / Trunk Assessment Cervical / Trunk Assessment: Normal      End of Session PT - End of Session Activity Tolerance: Treatment limited secondary to medical complications (Comment) (limited by lethargy) Patient left: in bed;with family/visitor present    Wells Guiles B. [place], Geraldine, DPT [phone]   09/26/2012, 3:17 PM

## 2012-09-26 NOTE — Progress Notes (Signed)
Utilization review completed.  

## 2012-09-26 NOTE — Progress Notes (Signed)
Blood pressure elevated 157/110, scheduled lopressor given, Dr. Wynelle Cleveland notified orders given.

## 2012-09-26 NOTE — Progress Notes (Signed)
Inpatient Diabetes Program Recommendations  AACE/ADA: New Consensus Statement on Inpatient Glycemic Control (2013)  Target Ranges:  Prepandial:   less than 140 mg/dL      Peak postprandial:   less than 180 mg/dL (1-2 hours)      Critically ill patients:  140 - 180 mg/dL   Reason for Visit: Results for EILAH, BRADLE (MRN [id number]) as of 09/26/2012 11:31  Ref. Range 09/25/2012 17:57 09/25/2012 22:16 09/26/2012 00:29 09/26/2012 05:01 09/26/2012 07:46  Glucose-Capillary Latest Range: 70-99 mg/dL 255 (H) 158 (H) 154 (H) 174 (H) 170 (H)   According to medication reconciliation, patient took 70/30 insulin 24 units bid prior to admission.  Patient is currently NPO, but would benefit from the addition of basal insulin.  Consider adding Lantus 15 units daily while patient is in hospital to meet basal insulin needs.

## 2012-09-26 NOTE — Evaluation (Signed)
Clinical/Bedside Swallow Evaluation Patient Details  Name: [name] MRN: [id number] Date of Birth: March 31, 1945  Today's Date: 09/26/2012 Time: [id number] SLP Time Calculation (min): 11 min  Past Medical History:  Past Medical History  Diagnosis Date  . Diabetes mellitus   . Seizures   . Hypertension    Past Surgical History: History reviewed. No pertinent past surgical history. HPI:  67 y.o. female with known seizure disorder on Tegretol 400 mg BID. The last time family had spoken to her was on Friday (3 days ago).  Per chart, Patients sister found patient on the floor 09/25/12.  EMS was called, patient was then brought to [facility] for further evaluation and treatment. She was found to have generalized intention tremors, weak and somewhat confused. She was also found to be in acute renal failure, mild rhabdomyolysis with tachycardia and uncontrolled blood pressure.  Initial CT head was negative for acute stroke, bleed or mass. MRI pending.  Pt arouses for brief periods with some restlessness per RN.    Assessment / Plan / Recommendation Clinical Impression  Pt able to participate minimally in swallow eval given decreased MS.  Aroused for brief moments, during which she could state her name and location; had difficulty f/c.  Tolerated small sips of water, ice chips with no s/s aspiration.  Demonstrated poor oral recognition and manipulation of purees, requiring eventual oral suctioning for removal.  Pt not sufficiently alert for POs at this time.  Recommend NPO except for sips/chips when fully alert.  SLP will follow for readiness for diet advancement.  RN present; discussed results/recs.    Aspiration Risk  Moderate    Diet Recommendation Ice chips PRN after oral care;NPO   Liquid Administration via: Cup;Spoon;Straw          Frequency and Duration min 2x/week  1 week   Pertinent Vitals/Pain No expression of pain    SLP Swallow Goals Patient will utilize recommended  strategies during swallow to increase swallowing safety with: Supervision/safety   Swallow Study Prior Functional Status       General Date of Onset: 09/25/12 Type of Study: Bedside swallow evaluation Previous Swallow Assessment: none per records Diet Prior to this Study: NPO Temperature Spikes Noted: No Respiratory Status: Room air Behavior/Cognition: Lethargic Oral Cavity - Dentition: Missing dentition Self-Feeding Abilities: Total assist Patient Positioning: Upright in bed Baseline Vocal Quality: Clear Volitional Cough: Cognitively unable to elicit Volitional Swallow: Unable to elicit    Oral/Motor/Sensory Function Overall Oral Motor/Sensory Function: Other (comment) (no asymmetry noted)   Ice Chips Ice chips: Within functional limits Presentation: Spoon   Thin Liquid Thin Liquid: Within functional limits Presentation: Straw;Cup    Nectar Thick Nectar Thick Liquid: Not tested   Honey Thick Honey Thick Liquid: Not tested   Puree Puree: Impaired Presentation: Spoon Oral Phase Impairments: Reduced labial seal;Reduced lingual movement/coordination;Poor awareness of bolus;Impaired mastication Oral Phase Functional Implications: Prolonged oral transit;Oral residue;Oral holding Pharyngeal Phase Impairments: Multiple swallows   Solid   GO    Solid: Not tested       [name] 09/26/2012,12:03 PM

## 2012-09-27 LAB — GLUCOSE, CAPILLARY: Glucose-Capillary: 125 mg/dL — ABNORMAL HIGH (ref 70–99)

## 2012-09-27 MED ORDER — LABETALOL HCL 5 MG/ML IV SOLN
10.0000 mg | Freq: Four times a day (QID) | INTRAVENOUS | Status: DC
  Administered 2012-09-27: 10 mg via INTRAVENOUS
  Filled 2012-09-27: qty 4

## 2012-09-27 MED ORDER — SODIUM CHLORIDE 0.45 % IV SOLN
INTRAVENOUS | Status: DC
  Administered 2012-09-27 – 2012-09-28 (×2): via INTRAVENOUS

## 2012-09-27 MED ORDER — LABETALOL HCL 5 MG/ML IV SOLN
5.0000 mg | Freq: Four times a day (QID) | INTRAVENOUS | Status: DC
  Administered 2012-09-28 (×3): 5 mg via INTRAVENOUS
  Filled 2012-09-27 (×6): qty 4

## 2012-09-27 MED ORDER — CLONIDINE HCL 0.2 MG PO TABS
0.2000 mg | ORAL_TABLET | Freq: Three times a day (TID) | ORAL | Status: DC
  Administered 2012-09-27 – 2012-10-03 (×20): 0.2 mg via ORAL
  Filled 2012-09-27 (×21): qty 1

## 2012-09-27 MED ORDER — PANTOPRAZOLE SODIUM 40 MG PO TBEC
40.0000 mg | DELAYED_RELEASE_TABLET | Freq: Every day | ORAL | Status: DC
  Administered 2012-09-27 – 2012-10-03 (×7): 40 mg via ORAL
  Filled 2012-09-27 (×7): qty 1

## 2012-09-27 MED ORDER — AMLODIPINE BESYLATE 10 MG PO TABS
10.0000 mg | ORAL_TABLET | Freq: Every day | ORAL | Status: DC
  Administered 2012-09-27 – 2012-10-03 (×7): 10 mg via ORAL
  Filled 2012-09-27 (×7): qty 1

## 2012-09-27 MED ORDER — INSULIN ASPART 100 UNIT/ML ~~LOC~~ SOLN
0.0000 [IU] | Freq: Three times a day (TID) | SUBCUTANEOUS | Status: DC
  Administered 2012-09-27: 5 [IU] via SUBCUTANEOUS
  Administered 2012-09-28: 3 [IU] via SUBCUTANEOUS
  Administered 2012-09-28 (×2): 5 [IU] via SUBCUTANEOUS

## 2012-09-27 MED ORDER — HYDRALAZINE HCL 20 MG/ML IJ SOLN
5.0000 mg | Freq: Four times a day (QID) | INTRAMUSCULAR | Status: DC | PRN
  Administered 2012-09-27: 5 mg via INTRAVENOUS
  Filled 2012-09-27: qty 1

## 2012-09-27 NOTE — Progress Notes (Signed)
SBP 108/38, trending down-notified MD, new parameters added to labetalol order. Will continue to monitor.

## 2012-09-27 NOTE — Progress Notes (Signed)
Rehab Admissions Coordinator Note:  Patient was screened by Cleatrice Burke for appropriateness for an Inpatient Acute Rehab Consult.  At this time, we are recommending Inpatient Rehab consult.  Cleatrice Burke 09/27/2012, 6:24 PM  I can be reached at [phone].

## 2012-09-27 NOTE — Progress Notes (Signed)
Speech Language Pathology Dysphagia Treatment Patient Details Name: [name] MRN: [id number] DOB: 1945-09-14 Today's Date: 09/27/2012 Time: 0911-0928 SLP Time Calculation (min): 17 min  Assessment / Plan / Recommendation Clinical Impression  Pt. seen for skilled therapy for diagnostic reassessment of swallow function with family at bedside.  She is alert today and communicative mildly delayed processing.  On arrival pt. asleep with foam-like secretions on lips.  She was noted to exhibit constant lingual and jaw movements prior to and during po's possibly due to cognitive effects of seizure.  Oral phase mildly delayed.  No s/s aspiration during pharyngeal phase thin with straw.  Daughter is bringing upper denture plate to hospital this morning as pt. reports she eats/drinks with them.  Recommend she initiate Dys 2 diet texture and thin, straws ok and pills with water.  ST will follow for safety and ability to upgrade texture.     Diet Recommendation  Continue with Current Diet: Dysphagia 2 (fine chop);Thin liquid    SLP Plan Continue with current plan of care   Pertinent Vitals/Pain none   Swallowing Goals  SLP Swallowing Goals Patient will utilize recommended strategies during swallow to increase swallowing safety with: Supervision/safety Swallow Study Goal #2 - Progress: Progressing toward goal  General Temperature Spikes Noted: No Respiratory Status: Supplemental O2 delivered via (comment) Behavior/Cognition: Alert;Cooperative;Pleasant mood;Requires cueing Oral Cavity - Dentition:  (5 natural lower, missing upper plate, dtr to bring this am) Patient Positioning: Upright in bed  Oral Cavity - Oral Hygiene Does patient have any of the following "at risk" factors?: Saliva - thick, dry mouth;Oxygen therapy - cannula, mask, simple oxygen devices;Nutritional status - inadequate Brush patient's teeth BID with toothbrush (using toothpaste with fluoride): Yes Patient is HIGH RISK - Oral  Care Protocol followed (see row info): Yes Patient is AT RISK - Oral Care Protocol followed (see row info): Yes   Dysphagia Treatment Treatment focused on: Skilled observation of diet tolerance;Upgraded PO texture trials;Patient/family/caregiver education Family/Caregiver Educated: grandaughter Treatment Methods/Modalities: Skilled observation;Differential diagnosis Patient observed directly with PO's: Yes Type of PO's observed: Thin liquids;Dysphagia 1 (puree) Feeding: Total assist (total assist due to mittens) Liquids provided via: Straw;Cup;Teaspoon Oral Phase Signs & Symptoms: Prolonged oral phase Type of cueing: Verbal Amount of cueing: Minimal   GO     Houston Siren M.Ed [employer] [phone] 09/27/2012

## 2012-09-27 NOTE — Progress Notes (Signed)
Physical Therapy Treatment Patient Details Name: [name] MRN: [id number] DOB: 04-Jul-1945 Today's Date: 09/27/2012 Time: [id number] PT Time Calculation (min): 16 min  PT Assessment / Plan / Recommendation  History of Present Illness 67 y.o. female admitted to [facility] on 09/25/12 with a Past Medical History of seizure disorder, diabetes, hypertension, morbid obesity was found on the floor near the kitchen. Per his sister who found her, there was no feces or urine near the patient. There was no tongue bite. Patient apparently recognized her sister. EMS was called, patient was then brought to [facility] for further evaluation and treatment. She was found to have generalized intention tremors, weak and somewhat confused. She was also found to be in acute renal failure, mild rhabdomyolysis with tachycardia and uncontrolled blood pressure.    PT Comments   Pt admitted with AMS. Pt currently with functional limitations due to weakness and poor cognition.  Pt will benefit from skilled PT to increase their independence and safety with mobility to allow discharge to the venue listed below.   Follow Up Recommendations  CIR;Supervision/Assistance - 24 hour                 Equipment Recommendations  Rolling walker with 5" wheels;3in1 (PT)    Recommendations for Other Services Rehab consult  Frequency Min 3X/week   Progress towards PT Goals Progress towards PT goals: Progressing toward goals  Plan Current plan remains appropriate    Precautions / Restrictions Precautions Precautions: Fall Restrictions Weight Bearing Restrictions: No   Pertinent Vitals/Pain VSS, no pain    Mobility  Bed Mobility Bed Mobility: Rolling Right;Right Sidelying to Sit;Sitting - Scoot to Edge of Bed Rolling Right: 3: Mod assist Right Sidelying to Sit: 1: +2 Total assist;HOB flat Right Sidelying to Sit: Patient Percentage: 40% Sitting - Scoot to Edge of Bed: 3: Mod assist Details for Bed Mobility Assistance:  Pt needed cues for sequencing.  Pt reaching for PT to pull up on PT with her hand.  Took incr time to get to EOB.   Transfers Transfers: Sit to Stand;Stand to Sit;Stand Pivot Transfers Sit to Stand: 1: +2 Total assist;With upper extremity assist;From bed Sit to Stand: Patient Percentage: 50% Stand to Sit: 1: +2 Total assist;With armrests;To chair/3-in-1;With upper extremity assist Stand to Sit: Patient Percentage: 60% Stand Pivot Transfers: 1: +2 Total assist Stand Pivot Transfers: Patient Percentage: 60% Details for Transfer Assistance: Pt needed cues and assist for hand placement for sit to stand and stand to sit.  Difficulty processing throughout.  Pt initially began marching in place and did not step around to chair until maximally cued and gave tactile guidance.   Ambulation/Gait Ambulation/Gait Assistance: Not tested (comment) Stairs: No Wheelchair Mobility Wheelchair Mobility: No    Exercises General Exercises - Lower Extremity Long Arc Quad: AROM;Both;10 reps;Seated Hip Flexion/Marching: AROM;Both;10 reps;Seated   PT Goals (current goals can now be found in the care plan section)    Visit Information  Last PT Received On: 09/27/12 Assistance Needed: +2 History of Present Illness: 67 y.o. female admitted to [facility] on 09/25/12 with a Past Medical History of seizure disorder, diabetes, hypertension, morbid obesity was found on the floor near the kitchen. Per his sister who found her, there was no feces or urine near the patient. There was no tongue bite. Patient apparently recognized her sister. EMS was called, patient was then brought to [facility] for further evaluation and treatment. She was found to have generalized intention tremors, weak and  somewhat confused. She was also found to be in acute renal failure, mild rhabdomyolysis with tachycardia and uncontrolled blood pressure.     Subjective Data  Subjective: "I will try,"   Cognition  Cognition Arousal/Alertness:  Awake/alert Behavior During Therapy: Flat affect Overall Cognitive Status: Impaired/Different from baseline Area of Impairment: Orientation;Attention;Memory Orientation Level: Place;Time;Situation Memory: Decreased short-term memory General Comments: Poor processing.      Balance  Balance Balance Assessed: Yes Static Sitting Balance Static Sitting - Balance Support: Bilateral upper extremity supported;Feet supported Static Sitting - Level of Assistance: 4: Min assist Static Sitting - Comment/# of Minutes: 2 Static Standing Balance Static Standing - Balance Support: Bilateral upper extremity supported;During functional activity Static Standing - Level of Assistance: 3: Mod assist Static Standing - Comment/# of Minutes: 2  End of Session PT - End of Session Equipment Utilized During Treatment: Gait belt Activity Tolerance: Patient tolerated treatment well Patient left: in chair;with call bell/phone within reach Nurse Communication: Mobility status        INGOLD,Caris Cerveny 09/27/2012, 1:07 PM [facility] [phone] (pager)

## 2012-09-27 NOTE — Progress Notes (Addendum)
Subjective: Family reports that she is much improved, though still with some confusion.   Exam: Filed Vitals:   09/27/12 0700  BP: 178/88  Pulse: 81  Temp: 99.2 F (37.3 C)  Resp: 17   Gen: In bed, NAD MS: Awake, Alert, oriented to hospital but not [place]. No perseveration today.  [id number], EOMI, able to count fingers in all visual fields.  Motor: Mild tremor bilaterally.  Sensory: symmetric to LT   Impression: 67 yo with history of seizure found with AMS that is gradually improving. No signs of infection on exam and patient is improving. I suspect prolonged seizure as an etiology for her presentation given the elevated CPK. With her continued improvement, I would favor continuing her current regimen at this time.   Recommendations: 1)Continue carbamazepine 400mg  BID 2) Continue vimpat 200mg  Q12H 3) MRI brain   Roland Rack, MD [facility] [phone]  If 7pm- 7am, please page neurology on call at [phone].

## 2012-09-27 NOTE — Progress Notes (Signed)
[facility] Progress Note [place] TEAM 1 - Stepdown/ICU TEAM   CHASITI GLASSCOCK [id number] DOB: [birth year] DOA: 09/25/2012 PCP: Default, Provider, MD  Brief narrative: 67 y.o. female with a Past Medical History of seizure disorder, diabetes, hypertension, morbid obesity who presented with altered mental status. Patient lives by herself, her family had last talked to her on the phone on 3 days before her admit. Per family patient is known to be compliant with her diet. On Saturday 8/31 family tried to contact her but she was not able to be contacted. One of the sisters, finally went to the patient's house 9/01 and she was found on the floor. Per sister who found her, there was no feces or urine near the patient. There was no tongue bite. Patient apparently recognized her sister. EMS was called, patient was then brought to [facility] for further evaluation and treatment. She was found to have generalized intention tremors, weak and somewhat confused. She was also found to be in acute renal failure, mild rhabdomyolysis with tachycardia and uncontrolled blood pressure.  Assessment/Plan:  Acute encephalopathy - possibly post ictal s/p seizure - Neuro following - possible infection with low grade fevers but not giving antibiotics for now as no source has been found - B12, folate, and TSH normal  - Ativan PRN - mental status much improved/essentially back to baseline today  HYPERTENSION - uncontrolled  - pocketing food - safer to use IV for now due to unpredictable PO intake - BP very poorly controlled - adjust tx regimen and follow - ?HTN related encephalopathy (PRES)  SEIZURE DISORDER - IV Vimpat per Neuro - using crushable PO Carbamazepine as no IV equivalent available - EEG unrevelaing - MRI unable to be obtained due to tremors/confusion - not clear MRI still required given improvement in mental status - follow for today - consider re-order MRI later today or in AM    Rhabdomyolysis - cont IV hydration - CK trending down - cont to follow trend until <500  Elevated troponin - very mild - due to rhabdo - improving  CKD  stage 3, GFR 30-59 ml/min - Cr has really not increased much from baseline - has been 1.62 and 1.68 this year - recheck in AM  Tremor - per Neuro   DIABETES MELLITUS, TYPE II - CBG reasonably controlled at this time - A1c 7.1  Morbid obesity  Code Status: FULL Family Communication: spoke w/ granddaughter and sister at bedside Disposition Plan: SDU  Consultants: Neuro  Procedures: EEG - 9/2 - slow activity that is most consistent with drowse and sleep - no epileptiform activity is noted  Antibiotics: none  DVT prophylaxis: Heparin  HPI/Subjective: Pt is awake and conversant.  Tells me "I gotta pee."  She denies any other specific complaints.  No ha, cp, sob, f/c, n/v, or abdom pain.    Objective: Blood pressure 178/88, pulse 81, temperature 99.2 F (37.3 C), temperature source Axillary, resp. rate 17, weight 112 kg (246 lb 14.6 oz), SpO2 100.00%.  Intake/Output Summary (Last 24 hours) at 09/27/12 1006 Last data filed at 09/27/12 0700  Gross per 24 hour  Intake   2715 ml  Output      0 ml  Net   2715 ml   Exam: General: No acute respiratory distress Lungs: Clear to auscultation bilaterally without wheezes or crackles Cardiovascular: Regular rate and rhythm without murmur gallop or rub normal S1 and S2 Abdomen: Nontender, nondistended, soft, bowel sounds positive, no rebound, no ascites,  no appreciable mass Extremities: No significant cyanosis, clubbing, or edema bilateral lower extremities Neuro: 5/5 stregth in all extremities, CN 2-12 intact, PERRLA - alert and oriented x3  Data Reviewed: Basic Metabolic Panel:  Recent Labs Lab 09/25/12 1245 09/25/12 1700 09/26/12 0450  NA 134*  --  138  K 3.1*  --  3.7  CL 92*  --  99  CO2 23  --  27  GLUCOSE 283*  --  184*  BUN 17  --  14  CREATININE 1.68*  1.64* 1.43*  CALCIUM 9.3  --  8.8   Liver Function Tests:  Recent Labs Lab 09/25/12 1245 09/26/12 0450  AST 46* 54*  ALT 23 26  ALKPHOS 75 62  BILITOT 0.9 0.8  PROT 7.3 6.8  ALBUMIN 3.2* 2.9*   CBC:  Recent Labs Lab 09/25/12 1245 09/25/12 1700 09/26/12 0450  WBC 9.2 9.5 7.7  HGB 13.4 12.2 11.8*  HCT 36.4 34.0* 33.2*  MCV 85.4 85.6 85.8  PLT 193 191 199   Cardiac Enzymes:  Recent Labs Lab 09/25/12 1245 09/25/12 1621 09/25/12 2205 09/26/12 0450 09/26/12 0457 09/26/12 1800  CKTOTAL 1045*  --   --  962*  --  777*  CKMB 16.6*  --   --   --   --   --   TROPONINI  --  0.85* 0.92*  --  0.66*  --    CBG:  Recent Labs Lab 09/26/12 1652 09/26/12 1950 09/26/12 2342 09/27/12 0349 09/27/12 0744  GLUCAP 138* 118* 116* 125* 114*    Recent Results (from the past 240 hour(s))  MRSA PCR SCREENING     Status: None   Collection Time    09/25/12  4:26 PM      Result Value Range Status   MRSA by PCR NEGATIVE  NEGATIVE Final   Comment:            The GeneXpert MRSA Assay (FDA     approved for NASAL specimens     only), is one component of a     comprehensive MRSA colonization     surveillance program. It is not     intended to diagnose MRSA     infection nor to guide or     monitor treatment for     MRSA infections.     Studies:  Recent x-ray studies have been reviewed in detail by the Attending Physician  Scheduled Meds:  Scheduled Meds: . carbamazepine  400 mg Oral BID  . cloNIDine  0.2 mg Transdermal Weekly  . heparin  5,000 Units Subcutaneous Q8H  . insulin aspart  0-15 Units Subcutaneous Q4H  . lacosamide (VIMPAT) IV  200 mg Intravenous Q12H  . metoprolol  5 mg Intravenous Q4H  . pantoprazole (PROTONIX) IV  40 mg Intravenous Q24H  . sodium chloride  3 mL Intravenous Q12H    Time spent on care of this patient: 35 mins   Zarria Towell T, MD  [facility] Office  [phone] Pager - Text Page per Shea Evans as per below:  On-Call/Text  Page:      [email or web address]      password [email or web address]  If 7PM-7AM, please contact night-coverage [email or web address] Password [email or web address] 09/27/2012, 10:06 AM   LOS: 2 days

## 2012-09-28 DIAGNOSIS — I6330 Cerebral infarction due to thrombosis of unspecified cerebral artery: Secondary | ICD-10-CM

## 2012-09-28 DIAGNOSIS — R569 Unspecified convulsions: Secondary | ICD-10-CM

## 2012-09-28 DIAGNOSIS — I10 Essential (primary) hypertension: Secondary | ICD-10-CM

## 2012-09-28 LAB — GLUCOSE, CAPILLARY
Glucose-Capillary: 164 mg/dL — ABNORMAL HIGH (ref 70–99)
Glucose-Capillary: 220 mg/dL — ABNORMAL HIGH (ref 70–99)
Glucose-Capillary: 216 mg/dL — ABNORMAL HIGH (ref 70–99)

## 2012-09-28 LAB — BASIC METABOLIC PANEL
CO2: 28 mEq/L (ref 19–32)
Chloride: 106 mEq/L (ref 96–112)
Creatinine, Ser: 1.38 mg/dL — ABNORMAL HIGH (ref 0.50–1.10)

## 2012-09-28 LAB — CBC
HCT: 30.2 % — ABNORMAL LOW (ref 36.0–46.0)
MCV: 89.9 fL (ref 78.0–100.0)
RBC: 3.36 MIL/uL — ABNORMAL LOW (ref 3.87–5.11)
RDW: 12.6 % (ref 11.5–15.5)
WBC: 3.3 10*3/uL — ABNORMAL LOW (ref 4.0–10.5)

## 2012-09-28 LAB — AMMONIA: Ammonia: 27 umol/L (ref 11–60)

## 2012-09-28 LAB — VITAMIN D 25 HYDROXY (VIT D DEFICIENCY, FRACTURES): Vit D, 25-Hydroxy: 16 ng/mL — ABNORMAL LOW (ref 30–89)

## 2012-09-28 MED ORDER — CARBAMAZEPINE 200 MG PO TABS
400.0000 mg | ORAL_TABLET | Freq: Two times a day (BID) | ORAL | Status: DC
  Administered 2012-09-28 – 2012-10-03 (×10): 400 mg via ORAL
  Filled 2012-09-28 (×11): qty 2

## 2012-09-28 MED ORDER — EZETIMIBE 10 MG PO TABS
10.0000 mg | ORAL_TABLET | Freq: Every day | ORAL | Status: DC
  Administered 2012-09-28 – 2012-10-02 (×5): 10 mg via ORAL
  Filled 2012-09-28 (×6): qty 1

## 2012-09-28 MED ORDER — CITALOPRAM HYDROBROMIDE 20 MG PO TABS
20.0000 mg | ORAL_TABLET | Freq: Every day | ORAL | Status: DC
  Administered 2012-09-28 – 2012-10-03 (×6): 20 mg via ORAL
  Filled 2012-09-28 (×6): qty 1

## 2012-09-28 MED ORDER — EZETIMIBE-SIMVASTATIN 10-40 MG PO TABS: 1.0000 | ORAL_TABLET | Freq: Every day | ORAL | Status: DC

## 2012-09-28 MED ORDER — INSULIN ASPART PROT & ASPART (70-30 MIX) 100 UNIT/ML ~~LOC~~ SUSP
10.0000 [IU] | Freq: Two times a day (BID) | SUBCUTANEOUS | Status: DC
  Administered 2012-09-28 – 2012-10-03 (×8): 10 [IU] via SUBCUTANEOUS
  Filled 2012-09-28 (×2): qty 10

## 2012-09-28 MED ORDER — LORAZEPAM 2 MG/ML IJ SOLN
1.0000 mg | INTRAMUSCULAR | Status: AC
  Filled 2012-09-28: qty 1

## 2012-09-28 MED ORDER — METOPROLOL TARTRATE 50 MG PO TABS
50.0000 mg | ORAL_TABLET | Freq: Two times a day (BID) | ORAL | Status: DC
  Administered 2012-09-28 – 2012-10-03 (×10): 50 mg via ORAL
  Filled 2012-09-28 (×11): qty 1

## 2012-09-28 MED ORDER — ATORVASTATIN CALCIUM 20 MG PO TABS
20.0000 mg | ORAL_TABLET | Freq: Every day | ORAL | Status: DC
  Administered 2012-09-28 – 2012-10-02 (×5): 20 mg via ORAL
  Filled 2012-09-28 (×6): qty 1

## 2012-09-28 NOTE — Progress Notes (Signed)
I await completion of workup to assist in determining most appropriate rehab venue. [id number]

## 2012-09-28 NOTE — Progress Notes (Addendum)
[facility] Progress Note [place] TEAM 1 - Stepdown/ICU TEAM   [name] [id number] DOB: [birth year] DOA: 09/25/2012 PCP: Default, Provider, MD  Brief narrative: 67 y.o. female with a Past Medical History of seizure disorder, diabetes, hypertension, morbid obesity who presented with altered mental status. Patient lives by herself, her family had last talked to her on the phone on 3 days before her admit. Per family patient is known to be compliant with her diet. On Saturday 8/31 family tried to contact her but she was not able to be contacted. One of the sisters, finally went to the patient's house 9/01 and she was found on the floor. Per sister who found her, there was no feces or urine near the patient. There was no tongue bite. Patient apparently recognized her sister. EMS was called, patient was then brought to [facility] for further evaluation and treatment. She was found to have generalized intention tremors, weak and somewhat confused. She was also found to be in acute renal failure, mild rhabdomyolysis with tachycardia and uncontrolled blood pressure.  Assessment/Plan:  Acute encephalopathy - possibly post ictal on 9/2 s/p seizure - - mental status much improved/essentially back to baseline by 9/3 - possible infection with low grade fevers but not giving antibiotics for now as no source has been found - B12, folate, and TSH normal  - Ativan PRN - MRI pending- now that she is alert, will only use ativan - previously was in  post-ictal delirium and despite Ativan, was not steady enough for MRI on 9/2.   Low grade fevers - on admission - see above  HYPERTENSION - uncontrolled  - pocketing food initially - safer to use IV at that point - PO meds being resumed - BP improved   SEIZURE DISORDER - IV Vimpat per Neuro - cont PO Carbamazepine as no IV equivalent available - EEG unrevelaing - MRI unable to be obtained due to tremors/confusion - see  above  Rhabdomyolysis -- CK trending down - can d/c IVF- encourage PO intake  Elevated troponin - very mild - due to rhabdo - improving  CKD  stage 3, GFR 30-59 ml/min - Cr has really not increased much from baseline - has been 1.62 and 1.68 this year - recheck in AM  Tremor - per Neuro   DIABETES MELLITUS, TYPE II - CBG reasonably controlled at this time - A1c 7.1 - resume 70/30 at lower dose as she is not eating as much as she does at home - d/c sliding scale but cont accuchecks  Morbid obesity  Pocketing of food Resolved- advance to regular diet today- pt wanting a sandwich  Code Status: FULL Family Communication: spoke w/ sister at bedside Disposition Plan: SDU  Consultants: Neuro  Procedures: EEG - 9/2 - slow activity that is most consistent with drowse and sleep - no epileptiform activity is noted  Antibiotics: none  DVT prophylaxis: Heparin  HPI/Subjective: Pt is awake and conversant sitting in a chair- has no complaints other than wanting diet advanced- agreeable to MRI with IV Ativan  Objective: Blood pressure 138/64, pulse 58, temperature 98.3 F (36.8 C), temperature source Oral, resp. rate 14, weight 112.1 kg (247 lb 2.2 oz), SpO2 70.00%.  Intake/Output Summary (Last 24 hours) at 09/28/12 1747 Last data filed at 09/28/12 1500  Gross per 24 hour  Intake 2191.67 ml  Output    300 ml  Net 1891.67 ml   Exam: General: No acute respiratory distress Lungs: Clear to auscultation bilaterally  without wheezes or crackles Cardiovascular: Regular rate and rhythm without murmur gallop or rub normal S1 and S2 Abdomen: Nontender, nondistended, soft, bowel sounds positive, no rebound, no ascites, no appreciable mass Extremities: No significant cyanosis, clubbing, or edema bilateral lower extremities Neuro: 5/5 stregth in all extremities, CN 2-12 intact, PERRLA - alert and oriented x3  Data Reviewed: Basic Metabolic Panel:  Recent Labs Lab 09/25/12 1245  09/25/12 1700 09/26/12 0450 09/28/12 0355  NA 134*  --  138 142  K 3.1*  --  3.7 3.8  CL 92*  --  99 106  CO2 23  --  27 28  GLUCOSE 283*  --  184* 189*  BUN 17  --  14 17  CREATININE 1.68* 1.64* 1.43* 1.38*  CALCIUM 9.3  --  8.8 8.5   Liver Function Tests:  Recent Labs Lab 09/25/12 1245 09/26/12 0450  AST 46* 54*  ALT 23 26  ALKPHOS 75 62  BILITOT 0.9 0.8  PROT 7.3 6.8  ALBUMIN 3.2* 2.9*   CBC:  Recent Labs Lab 09/25/12 1245 09/25/12 1700 09/26/12 0450 09/28/12 0355  WBC 9.2 9.5 7.7 3.3*  HGB 13.4 12.2 11.8* 10.3*  HCT 36.4 34.0* 33.2* 30.2*  MCV 85.4 85.6 85.8 89.9  PLT 193 191 199 155   Cardiac Enzymes:  Recent Labs Lab 09/25/12 1245 09/25/12 1621 09/25/12 2205 09/26/12 0450 09/26/12 0457 09/26/12 1800 09/28/12 0355  CKTOTAL 1045*  --   --  962*  --  777* 448*  CKMB 16.6*  --   --   --   --   --   --   TROPONINI  --  0.85* 0.92*  --  0.66*  --  <0.30   CBG:  Recent Labs Lab 09/27/12 2007 09/27/12 2137 09/28/12 0726 09/28/12 1157 09/28/12 1634  GLUCAP 158* 163* 164* 220* 216*    Recent Results (from the past 240 hour(s))  MRSA PCR SCREENING     Status: None   Collection Time    09/25/12  4:26 PM      Result Value Range Status   MRSA by PCR NEGATIVE  NEGATIVE Final   Comment:            The GeneXpert MRSA Assay (FDA     approved for NASAL specimens     only), is one component of a     comprehensive MRSA colonization     surveillance program. It is not     intended to diagnose MRSA     infection nor to guide or     monitor treatment for     MRSA infections.     Studies:  Recent x-ray studies have been reviewed in detail by the Attending Physician  Scheduled Meds:  Scheduled Meds: . amLODipine  10 mg Oral Daily  . atorvastatin  20 mg Oral QHS  . carbamazepine  400 mg Oral BID  . citalopram  20 mg Oral Daily  . cloNIDine  0.2 mg Oral TID  . ezetimibe  10 mg Oral QHS  . heparin  5,000 Units Subcutaneous Q8H  . insulin  aspart  0-15 Units Subcutaneous TID WC  . insulin aspart protamine- aspart  10 Units Subcutaneous BID WC  . lacosamide (VIMPAT) IV  200 mg Intravenous Q12H  . LORazepam  1 mg Intravenous On Call  . metoprolol  50 mg Oral BID  . pantoprazole  40 mg Oral Q1200  . sodium chloride  3 mL Intravenous Q12H    Time spent  on care of this patient: 53 mins   Debbe Odea, MD  [facility] Office  [phone] Pager - Text Page per Shea Evans as per below:  On-Call/Text Page:      [email or web address]      password [email or web address]  If 7PM-7AM, please contact night-coverage [email or web address] Password [email or web address] 09/28/2012, 5:47 PM   LOS: 3 days

## 2012-09-28 NOTE — Progress Notes (Signed)
Physical Therapy Treatment Patient Details Name: SABIAN SPROW MRN: [id number] DOB: Sep 02, 1945 Today's Date: 09/28/2012 Time: [id number] PT Time Calculation (min): 22 min  PT Assessment / Plan / Recommendation  History of Present Illness 67 y.o. female admitted to [facility] on 09/25/12 with a Past Medical History of seizure disorder, diabetes, hypertension, morbid obesity was found on the floor near the kitchen. Per his sister who found her, there was no feces or urine near the patient. There was no tongue bite. Patient apparently recognized her sister. EMS was called, patient was then brought to [facility] for further evaluation and treatment. She was found to have generalized intention tremors, weak and somewhat confused. She was also found to be in acute renal failure, mild rhabdomyolysis with tachycardia and uncontrolled blood pressure.   MRI pending.     PT Comments   Pt is progressing slowly, but daily with PT.  She is slow to process information and needs verbal and manual cues for initiation.  Per her sister, present during session.  She was fully able to function cognitively PTA.  This is new for her this admission.  Pt continues to be an excellent rehab candidate with all of the cognitive and physical deficits she exhibited during today's session.    Follow Up Recommendations  CIR;Supervision/Assistance - 24 hour     Does the patient have the potential to tolerate intense rehabilitation    Yes  Barriers to Discharge   None      Equipment Recommendations  Rolling walker with 5" wheels;3in1 (PT)    Recommendations for Other Services Rehab consult  Frequency Min 3X/week   Progress towards PT Goals Progress towards PT goals: Progressing toward goals  Plan Current plan remains appropriate    Precautions / Restrictions Precautions Precautions: Fall Precaution Comments: weak bil legs   Pertinent Vitals/Pain VSS throughout.  Pt on RA.      Mobility  Bed Mobility Bed Mobility: Not  assessed (pt is OOB sitting in recliner chair) Transfers Sit to Stand: 1: +2 Total assist;With upper extremity assist;With armrests;From chair/3-in-1 Sit to Stand: Patient Percentage: 70% Stand to Sit: 1: +2 Total assist;With upper extremity assist;With armrests;To chair/3-in-1 Stand to Sit: Patient Percentage: 70% Details for Transfer Assistance: Pt needed support of trunk bil to get to her feet.  Max verbal cues and manual cues to push up from chiar.  Max verbal and manual cues to reach back to chair an initiate sitting motion to sit back down when done walking.   Ambulation/Gait Ambulation/Gait Assistance: 3: Mod assist Ambulation Distance (Feet): 25 Feet Assistive device: Rolling walker Ambulation/Gait Assistance Details: min assist to support trunk for balance and help steer RW.  Max verbal cues and manual assist for safe RW use (pt too close to the front of RW).   Gait Pattern: Step-through pattern;Shuffle;Trunk flexed      PT Goals (current goals can now be found in the care plan section) Acute Rehab PT Goals Patient Stated Goal: find out what happened, return to prior level of independence.   Visit Information  Last PT Received On: 09/28/12 Assistance Needed: +2 (chair to follow) History of Present Illness: 67 y.o. female admitted to [facility] on 09/25/12 with a Past Medical History of seizure disorder, diabetes, hypertension, morbid obesity was found on the floor near the kitchen. Per his sister who found her, there was no feces or urine near the patient. There was no tongue bite. Patient apparently recognized her sister. EMS was called, patient was then  brought to [facility] for further evaluation and treatment. She was found to have generalized intention tremors, weak and somewhat confused. She was also found to be in acute renal failure, mild rhabdomyolysis with tachycardia and uncontrolled blood pressure.   MRI pending.      Subjective Data  Subjective: Pt reports that she  wants to wait until tomorrow to work with PT.  With encouragement from therapist and her sister, she reluctantly agreed to get up.   Patient Stated Goal: find out what happened, return to prior level of independence.    Cognition  Cognition Arousal/Alertness: Awake/alert Behavior During Therapy: WFL for tasks assessed/performed Overall Cognitive Status: Impaired/Different from baseline Area of Impairment: Orientation;Attention;Memory;Following commands;Safety/judgement;Awareness;Problem solving Orientation Level: Disoriented to;Time;Situation Current Attention Level: Sustained Memory: Decreased short-term memory Following Commands: Follows one step commands inconsistently Safety/Judgement: Decreased awareness of safety;Decreased awareness of deficits Awareness: Intellectual Problem Solving: Slow processing;Decreased initiation;Difficulty sequencing;Requires verbal cues;Requires tactile cues General Comments: Pt had some instances of repeating back to me what I said to her x 2 during session today.      Balance  Static Standing Balance Static Standing - Balance Support: Bilateral upper extremity supported Static Standing - Level of Assistance: 3: Mod assist  End of Session PT - End of Session Activity Tolerance: Patient limited by fatigue Patient left: in chair;with call bell/phone within reach;with family/visitor present    Wells Guiles B. [place], [place], DPT [phone]   09/28/2012, 3:08 PM

## 2012-09-28 NOTE — Progress Notes (Signed)
Occupational Therapy Evaluation Patient Details Name: [name] MRN: [id number] DOB: 1945/03/30 Today's Date: 09/28/2012 Time: [id number] OT Time Calculation (min): 21 min  OT Assessment / Plan / Recommendation History of present illness 67 y.o. female admitted to [facility] on 09/25/12 with a Past Medical History of seizure disorder, diabetes, hypertension, morbid obesity was found on the floor near the kitchen. Per his sister who found her, there was no feces or urine near the patient. There was no tongue bite. Patient apparently recognized her sister. EMS was called, patient was then brought to [facility] for further evaluation and treatment. She was found to have generalized intention tremors, weak and somewhat confused. She was also found to be in acute renal failure, mild rhabdomyolysis with tachycardia and uncontrolled blood pressure.   MRI pending.     Clinical Impression   PTA, pt lived alone and was independent with ADL and mod I with mobility.Family assisted with IADL. Pt with generalized weakness and significant cognitive deficits. Feel pt would benefit form CIR to return to PLOF. Unsure of level of caregiver  support. If pt does not go to CIR, she will need rehab at [facility]. Pt will benefit from skilled OT services to facilitate D/C to next venue due to below deficits.    OT Assessment  Patient needs continued OT Services    Follow Up Recommendations  CIR    Barriers to Discharge  ?caregiver support    Equipment Recommendations  3 in 1 bedside comode;Other (comment) (wide)    Recommendations for Other Services Rehab consult  Frequency  Min 2X/week    Precautions / Restrictions Precautions Precautions: Fall Precaution Comments: weak bil legs Restrictions Weight Bearing Restrictions: No   Pertinent Vitals/Pain no apparent distress     ADL  Eating/Feeding: Supervision/safety;Set up Where Assessed - Eating/Feeding: Chair Grooming: Minimal assistance Where Assessed -  Grooming: Supported sitting Upper Body Bathing: Minimal assistance Where Assessed - Upper Body Bathing: Supported sitting Lower Body Bathing: Maximal assistance Where Assessed - Lower Body Bathing: Supported sit to stand Upper Body Dressing: Minimal assistance Where Assessed - Upper Body Dressing: Supported sitting Lower Body Dressing: Maximal assistance Where Assessed - Lower Body Dressing: Supported sit to Lobbyist: Moderate assistance Toilet Transfer Method: Sit to Loss adjuster, chartered: Bedside commode Toileting - Clothing Manipulation and Hygiene: Moderate assistance Equipment Used: Gait belt Transfers/Ambulation Related to ADLs: mod  A sit - stand ADL Comments: significant funcitonal decline    OT Diagnosis: Generalized weakness;Cognitive deficits;Disturbance of vision;Apraxia;Altered mental status  OT Problem List: Decreased strength;Decreased activity tolerance;Impaired balance (sitting and/or standing);Impaired vision/perception;Decreased cognition;Decreased safety awareness;Decreased knowledge of use of DME or AE;Cardiopulmonary status limiting activity;Obesity OT Treatment Interventions: Self-care/ADL training;Therapeutic exercise;Energy conservation;DME and/or AE instruction;Therapeutic activities;Cognitive remediation/compensation;Visual/perceptual remediation/compensation;Patient/family education;Balance training   OT Goals(Current goals can be found in the care plan section) Acute Rehab OT Goals Patient Stated Goal: to cook OT Goal Formulation: With family Time For Goal Achievement: 10/12/12 Potential to Achieve Goals: Good  Visit Information  Last OT Received On: 09/28/12 Assistance Needed: +2 (chair to follow) History of Present Illness: 67 y.o. female admitted to [facility] on 09/25/12 with a Past Medical History of seizure disorder, diabetes, hypertension, morbid obesity was found on the floor near the kitchen. Per his sister who found her, there was no  feces or urine near the patient. There was no tongue bite. Patient apparently recognized her sister. EMS was called, patient was then brought to [facility] for further evaluation and treatment.  She was found to have generalized intention tremors, weak and somewhat confused. She was also found to be in acute renal failure, mild rhabdomyolysis with tachycardia and uncontrolled blood pressure.   MRI pending.         Prior [place] expects to be discharged to:: Private residence Living Arrangements: Alone Available Help at Discharge: Family;Available PRN/intermittently Type of Home: House Home Access: Level entry Home Layout: One level Home Equipment: Walker - standard;Shower seat;Walker - 4 wheels Additional Comments: daughter helped her 2 times a week with bathing because pt had a hard time getting to her feet Prior Function Level of Independence: Independent with assistive device(s) Comments: used walker PRN (rollator) Communication Communication: Other (comment) (difficult to assess due to level of arousal) Dominant Hand: Right         Vision/Perception Vision - History Baseline Vision: Wears glasses only for reading Patient Visual Report: Blurring of vision Vision - Assessment Eye Alignment: Within Functional Limits Vision Assessment: Vision not tested Additional Comments: will further assess Perception Perception: Not tested Praxis Praxis: Impaired Praxis Impairment Details: Perseveration   Cognition  Cognition Arousal/Alertness: Awake/alert Behavior During Therapy: WFL for tasks assessed/performed Overall Cognitive Status: Impaired/Different from baseline Area of Impairment: Orientation;Attention;Memory;Following commands;Safety/judgement;Awareness;Problem solving Orientation Level: Disoriented to;Time;Situation Current Attention Level: Sustained Memory: Decreased short-term memory Following Commands: Follows one step  commands with increased time Safety/Judgement: Decreased awareness of safety;Decreased awareness of deficits Awareness: Intellectual Problem Solving: Slow processing;Decreased initiation;Difficulty sequencing;Requires verbal cues;Requires tactile cues General Comments: perseverating    Extremity/Trunk Assessment Upper Extremity Assessment Upper Extremity Assessment: Generalized weakness Lower Extremity Assessment Lower Extremity Assessment: Generalized weakness Cervical / Trunk Assessment Cervical / Trunk Assessment: Normal     Mobility Bed Mobility Bed Mobility: Not assessed (pt is OOB sitting in recliner chair) Transfers Transfers: Sit to Stand;Stand to Sit Sit to Stand: With upper extremity assist;From chair/3-in-1;3: Mod assist Sit to Stand: Patient Percentage:  Stand to Sit: With upper extremity assist;To chair/3-in-1;3: Mod assist Stand to Sit: Patient Percentage:  Details for Transfer Assistance: used momentum to assist with transfer     Exercise     Balance Balance Balance Assessed: Yes Static Sitting Balance Static Sitting - Balance Support: Bilateral upper extremity supported;Feet supported Static Sitting - Level of Assistance: 5: Stand by assistance Static Standing Balance Static Standing - Balance Support: Bilateral upper extremity supported Static Standing - Level of Assistance: 3: Mod assist   End of Session OT - End of Session Equipment Utilized During Treatment: Gait belt Activity Tolerance: Patient tolerated treatment well Patient left: in chair;with call bell/phone within reach;with family/visitor present Nurse Communication: Mobility status  GO     Henritta Mutz,HILLARY 09/28/2012, 6:49 PM [facility], OTR/L  [phone] 09/28/2012

## 2012-09-28 NOTE — Progress Notes (Signed)
Speech Language Pathology Dysphagia Treatment Patient Details Name: [name] MRN: [id number] DOB: [birth year] Today's Date: 09/28/2012 Time: [id number] SLP Time Calculation (min): 8 min  Assessment / Plan / Recommendation Clinical Impression  Pt. seen for follow up dysphagia therapy and possible diet advancement.  Dentures brought from home yesterday and sister stated MD just upgraded diet to regular texture.  Unable to observe pt. with po's due to currently working with PT.  Sister reported pt. had difficulty masticating Dys 2 pork yesterday (may have been isolated to that particular meal).  Sister denied pt. coughing with liquids.  No changes in lung sounds.  Recommend she continue with regular texture diet and SLP will observe next date to further ensure safety and efficiency.  Having MRI sometime today.    Diet Recommendation  Continue with Current Diet: Regular;Thin liquid    SLP Plan Continue with current plan of care   Pertinent Vitals/Pain Not from observation   Swallowing Goals  SLP Swallowing Goals Patient will utilize recommended strategies during swallow to increase swallowing safety with: Supervision/safety Swallow Study Goal #2 - Progress: Progressing toward goal  General Temperature Spikes Noted: No Respiratory Status: Supplemental O2 delivered via (comment) Behavior/Cognition: Alert Oral Cavity - Dentition: Dentures, bottom;Dentures, top Patient Positioning: Upright in chair  Oral Cavity - Oral Hygiene Does patient have any of the following "at risk" factors?: Oxygen therapy - cannula, mask, simple oxygen devices Brush patient's teeth BID with toothbrush (using toothpaste with fluoride): Yes Patient is AT RISK - Oral Care Protocol followed (see row info): Yes   Dysphagia Treatment Treatment focused on: Patient/family/caregiver education Family/Caregiver Educated: sister Treatment Methods/Modalities: Skilled observation Patient observed directly with PO's:  No Reason PO's not observed:  (working with PT)   GO     Cranford Mon.Ed [employer] [phone]  09/28/2012

## 2012-09-28 NOTE — Progress Notes (Signed)
Subjective: Continues to have improvement. Family reports that she is "almost" back to baseline.   Exam: Filed Vitals:   09/28/12 0700  BP:   Pulse:   Temp: 97.7 F (36.5 C)  Resp:    Gen: In bed, NAD MS: Awake, Alert, oriented to place. She has very mild perseveration on "How are you" after asking her how she is.  [id number], EOMI, able to count fingers in all visual fields.  Motor: Mild tremor bilaterally with the appearance of enhanced physiological tremor. She has symmetric strength with 4+/5 bialteral leg weakness  Sensory: symmetric to LT   Impression: 67 yo with history of seizure found with AMS that is gradually improving. No signs of infection on exam and patient is improving. I suspect prolonged seizure as an etiology for her presentation given the elevated CPK. With her continued improvement, I would favor continuing her current regimen at this time. With perseveration, my concern for a frontal infarct continues, and therefore I feel that an MRI is needed. She was unable to complete one with ativan, would favor pursuing sedated MRI.   Recommendations: 1) Continue carbamazepine 400mg  BID 2) Continue vimpat 200mg  Q12H 3) MRI brain with sedation.   Roland Rack, MD [facility] [phone]  If 7pm- 7am, please page neurology on call at [phone].

## 2012-09-28 NOTE — Consult Note (Signed)
Physical Medicine and Rehabilitation Consult Reason for Consult: Acute encephalopathy Referring Physician: [facility]   HPI: [name] is a 67 y.o. right-handed female with history of hypertension, diabetes mellitus with peripheral neuropathy and seizure disorder. Patient lives alone. Admitted 09/25/2012 after being found down by her family. They were noted generalized and tension tremors as well as altered mental status. Cranial CT scan negative for acute changes. EEG showed no seizure activity. Patient is mildly elevated creatinine 1.68 and placed on intravenous fluids with latest creatinine 1.43. Patient remained on Tegretol for seizure disorder as prior to admission at 100 mg twice daily and the addition of Vimpat for breakthrough seizures. Subcutaneous heparin added for DVT prophylaxis. Patient currently maintained on a dysphagia 2 thin  liquid diet. Bouts of confusion suspect acute encephalopathy. Physical therapy evaluation completed 09/26/2012 with recommendations of physical medicine rehabilitation consult   Review of Systems  Cardiovascular: Positive for leg swelling.  Gastrointestinal: Positive for constipation.       GERD  Musculoskeletal: Positive for myalgias and joint pain.  Neurological: Positive for seizures.  Psychiatric/Behavioral: Positive for depression.  All other systems reviewed and are negative.   Past Medical History  Diagnosis Date  . Diabetes mellitus   . Seizures   . Hypertension    History reviewed. No pertinent past surgical history. Family History  Problem Relation Age of Onset  . Hypertension Mother   . Hypertension Father    Social History:  reports that she has never smoked. She does not have any smokeless tobacco history on file. She reports that she does not drink alcohol or use illicit drugs. Allergies: No Known Allergies Medications Prior to Admission  Medication Sig Dispense Refill  . amLODipine (NORVASC) 10 MG tablet Take 10 mg by mouth  daily.      . carbamazepine (TEGRETOL) 200 MG tablet Take 400 mg by mouth 2 (two) times daily.      . citalopram (CELEXA) 20 MG tablet Take 20 mg by mouth daily.      . cloNIDine (CATAPRES) 0.2 MG tablet Take 0.2 mg by mouth 2 (two) times daily.      [name] ezetimibe-simvastatin (VYTORIN) 10-40 MG per tablet Take 1 tablet by mouth at bedtime.      . furosemide (LASIX) 80 MG tablet Take 160 mg by mouth daily.       . insulin aspart protamine-insulin aspart (NOVOLOG 70/30) (70-30) 100 UNIT/ML injection Inject 24 Units into the skin 2 (two) times daily with a meal.      . metoprolol (LOPRESSOR) 50 MG tablet Take 50 mg by mouth daily.      . naproxen (NAPROSYN) 500 MG tablet Take 500 mg by mouth 2 (two) times daily as needed (for pain).      [name] omeprazole (PRILOSEC) 40 MG capsule Take 40 mg by mouth daily.        Home: Home Living Family/patient expects to be discharged to:: Private residence Living Arrangements: Alone Available Help at Discharge: Family;Available PRN/intermittently Type of Home: House Home Access: Level entry Home Layout: One level Home Equipment: Walker - standard;Shower seat Additional Comments: daughter helped her 2 times a week with bathing because pt had a hard time getting to her feet  Functional History: Prior Function Comments: used walker PRN Functional Status:  Mobility: Bed Mobility Bed Mobility: Rolling Right;Right Sidelying to Sit;Sitting - Scoot to Edge of Bed Rolling Right: 3: Mod assist Right Sidelying to Sit: 1: +2 Total assist;HOB flat Right Sidelying to Sit: Patient Percentage:  40% Sitting - Scoot to Edge of Bed: 3: Mod assist Transfers Transfers: Sit to Stand;Stand to Sit;Stand Pivot Transfers Sit to Stand: 1: +2 Total assist;With upper extremity assist;From bed Sit to Stand: Patient Percentage: 50% Stand to Sit: 1: +2 Total assist;With armrests;To chair/3-in-1;With upper extremity assist Stand to Sit: Patient Percentage: 60% Stand Pivot Transfers:  1: +2 Total assist Stand Pivot Transfers: Patient Percentage: 60% Ambulation/Gait Ambulation/Gait Assistance: Not tested (comment) Stairs: No Wheelchair Mobility Wheelchair Mobility: No  ADL:    Cognition: Cognition Overall Cognitive Status: Impaired/Different from baseline Orientation Level: Oriented to person;Oriented to place;Oriented to situation (knows president, not year) Cognition Arousal/Alertness: Awake/alert Behavior During Therapy: Flat affect Overall Cognitive Status: Impaired/Different from baseline Area of Impairment: Orientation;Attention;Memory Orientation Level: Place;Time;Situation Current Attention Level:  (none) Memory: Decreased short-term memory General Comments: Poor processing.    Blood pressure 111/58, pulse 62, temperature 97.7 F (36.5 C), temperature source Oral, resp. rate 15, weight 112 kg (246 lb 14.6 oz), SpO2 100.00%. Physical Exam  Vitals reviewed. Constitutional: She is oriented to person, place, and time.  Morbidly obese   HENT:  Head: Normocephalic and atraumatic.  Mouth/Throat: Oropharynx is clear and moist.  Eyes: EOM are normal.  No nystagmus  Neck: Normal range of motion. Neck supple. No JVD present. No tracheal deviation present. No thyromegaly present.  Cardiovascular: Normal rate and regular rhythm.   Pulmonary/Chest: Effort normal. No respiratory distress.  Abdominal: Soft. Bowel sounds are normal. She exhibits no distension. There is no tenderness.  Lymphadenopathy:    She has no cervical adenopathy.  Neurological: She is alert and oriented to person, place, and time. No cranial nerve deficit.  Patient is alert makes good eye contact with examiner. She was oriented x3 to basic questions. She did follow simple commands. She could not recall full events bringing her into the hospital. Decreased sensation in stocking glove distribution. Proximal greater than distal weakness in the legs. 1+ prox to 2+ to 3 distally.   Skin: Skin  is warm and dry.  Psychiatric: She has a normal mood and affect. Her behavior is normal. Judgment and thought content normal.    Results for orders placed during the hospital encounter of 09/25/12 (from the past 24 hour(s))  GLUCOSE, CAPILLARY     Status: Abnormal   Collection Time    09/27/12  7:44 AM      Result Value Range   Glucose-Capillary 114 (*) 70 - 99 mg/dL   Comment 1 Notify RN     Comment 2 Documented in Chart    GLUCOSE, CAPILLARY     Status: Abnormal   Collection Time    09/27/12 11:27 AM      Result Value Range   Glucose-Capillary 121 (*) 70 - 99 mg/dL   Comment 1 Notify RN     Comment 2 Documented in Chart    GLUCOSE, CAPILLARY     Status: Abnormal   Collection Time    09/27/12  4:45 PM      Result Value Range   Glucose-Capillary 217 (*) 70 - 99 mg/dL   Comment 1 Notify RN     Comment 2 Documented in Chart    GLUCOSE, CAPILLARY     Status: Abnormal   Collection Time    09/27/12  8:07 PM      Result Value Range   Glucose-Capillary 158 (*) 70 - 99 mg/dL   Comment 1 Documented in Chart     Comment 2 Notify RN    GLUCOSE,  CAPILLARY     Status: Abnormal   Collection Time    09/27/12  9:37 PM      Result Value Range   Glucose-Capillary 163 (*) 70 - 99 mg/dL   Comment 1 Documented in Chart     Comment 2 Notify RN    BASIC METABOLIC PANEL     Status: Abnormal   Collection Time    09/28/12  3:55 AM      Result Value Range   Sodium 142  135 - 145 mEq/L   Potassium 3.8  3.5 - 5.1 mEq/L   Chloride 106  96 - 112 mEq/L   CO2 28  19 - 32 mEq/L   Glucose, Bld 189 (*) 70 - 99 mg/dL   BUN 17  6 - 23 mg/dL   Creatinine, Ser 1.38 (*) 0.50 - 1.10 mg/dL   Calcium 8.5  8.4 - 10.5 mg/dL   GFR calc non Af Amer 39 (*) >90 mL/min   GFR calc Af Amer 45 (*) >90 mL/min  CBC     Status: Abnormal   Collection Time    09/28/12  3:55 AM      Result Value Range   WBC 3.3 (*) 4.0 - 10.5 K/uL   RBC 3.36 (*) 3.87 - 5.11 MIL/uL   Hemoglobin 10.3 (*) 12.0 - 15.0 g/dL   HCT 30.2  (*) 36.0 - 46.0 %   MCV 89.9  78.0 - 100.0 fL   MCH 30.7  26.0 - 34.0 pg   MCHC 34.1  30.0 - 36.0 g/dL   RDW 12.6  11.5 - 15.5 %   Platelets 155  150 - 400 K/uL  CK     Status: Abnormal   Collection Time    09/28/12  3:55 AM      Result Value Range   Total CK 448 (*) 7 - 177 U/L  TROPONIN I     Status: None   Collection Time    09/28/12  3:55 AM      Result Value Range   Troponin I <0.30  <0.30 ng/mL  AMMONIA     Status: None   Collection Time    09/28/12  3:55 AM      Result Value Range   Ammonia 27  11 - 60 umol/L   No results found.  Assessment/Plan: Diagnosis: seizure/encephalopathy, rhabdomyolysis after being down 1. Does the need for close, 24 hr/day medical supervision in concert with the patient's rehab needs make it unreasonable for this patient to be served in a less intensive setting? Yes 2. Co-Morbidities requiring supervision/potential complications: dm2, htn, morbid obesity 3. Due to bladder management, bowel management, safety, skin/wound care, disease management, medication administration, pain management and patient education, does the patient require 24 hr/day rehab nursing? Yes 4. Does the patient require coordinated care of a physician, rehab nurse, PT (1-2 hrs/day, 5 days/week) and OT (1-2 hrs/day, 5 days/week) to address physical and functional deficits in the context of the above medical diagnosis(es)? Yes Addressing deficits in the following areas: balance, endurance, locomotion, strength, transferring, bowel/bladder control, bathing, dressing, feeding, grooming, toileting and psychosocial support 5. Can the patient actively participate in an intensive therapy program of at least 3 hrs of therapy per day at least 5 days per week? Yes 6. The potential for patient to make measurable gains while on inpatient rehab is good 7. Anticipated functional outcomes upon discharge from inpatient rehab are min assist with PT, min to mod assist with OT, n/a with  SLP. 8. Estimated rehab length of stay to reach the above functional goals is: 2 wks + 9. Does the patient have adequate social supports to accommodate these discharge functional goals? Yes 10. Anticipated D/C setting: Home 11. Anticipated post D/C treatments: [facility] therapy 12. Overall Rehab/Functional Prognosis: excellent  RECOMMENDATIONS: This patient's condition is appropriate for continued rehabilitative care in the following setting: CIR Patient has agreed to participate in recommended program. Yes Note that insurance prior authorization may be required for reimbursement for recommended care.  Comment: Will follow for further workup and functional progress. Rehab RN to follow up.   Meredith Staggers, MD, [facility] Drown     09/28/2012

## 2012-09-29 ENCOUNTER — Inpatient Hospital Stay (HOSPITAL_COMMUNITY): Payer: PRIVATE HEALTH INSURANCE

## 2012-09-29 DIAGNOSIS — G9340 Encephalopathy, unspecified: Secondary | ICD-10-CM

## 2012-09-29 DIAGNOSIS — R7989 Other specified abnormal findings of blood chemistry: Secondary | ICD-10-CM

## 2012-09-29 DIAGNOSIS — M6282 Rhabdomyolysis: Secondary | ICD-10-CM

## 2012-09-29 DIAGNOSIS — N183 Chronic kidney disease, stage 3 (moderate): Secondary | ICD-10-CM

## 2012-09-29 LAB — GLUCOSE, CAPILLARY
Glucose-Capillary: 86 mg/dL (ref 70–99)
Glucose-Capillary: 140 mg/dL — ABNORMAL HIGH (ref 70–99)
Glucose-Capillary: 159 mg/dL — ABNORMAL HIGH (ref 70–99)

## 2012-09-29 MED ORDER — LORAZEPAM 2 MG/ML IJ SOLN
1.0000 mg | Freq: Once | INTRAMUSCULAR | Status: AC
  Administered 2012-09-29: 1 mg via INTRAMUSCULAR

## 2012-09-29 MED ORDER — VITAMIN D3 25 MCG (1000 UNIT) PO TABS
50000.0000 [IU] | ORAL_TABLET | ORAL | Status: DC
  Administered 2012-09-29: 50000 [IU] via ORAL
  Filled 2012-09-29: qty 50

## 2012-09-29 MED ORDER — SODIUM CHLORIDE 0.9 % IV SOLN
100.0000 mg | Freq: Two times a day (BID) | INTRAVENOUS | Status: DC
  Administered 2012-09-29 – 2012-09-30 (×2): 100 mg via INTRAVENOUS
  Filled 2012-09-29 (×4): qty 10

## 2012-09-29 NOTE — Progress Notes (Signed)
Utilization review completed.  

## 2012-09-29 NOTE — Progress Notes (Signed)
[facility] Progress Note [place] TEAM 1 - Stepdown/ICU TEAM   [name] [id number] DOB: 1945-04-21 DOA: 09/25/2012 PCP: Default, Provider, MD  Brief narrative: 67 y.o. female with a history of seizure disorder, diabetes, hypertension, morbid obesity who presented with altered mental status. Patient lives by herself, her family had last talked to her on the phone 3 days before her admit. On Saturday 8/31 family tried to contact her but she was not able to be contacted. One of the sisters went to the patient's house 9/01 and she was found on the floor. Per sister who found her there was no feces or urine near the patient. There was no tongue bite. Patient apparently recognized her sister. EMS was called, patient was then brought to [facility] for further evaluation and treatment. She was found to have generalized intention tremors, weak and was somewhat confused. She was also found to be in acute renal failure, mild rhabdomyolysis with tachycardia and uncontrolled blood pressure.  Assessment/Plan:  Acute encephalopathy - possibly post ictal on presentation s/p prolonged seizure  - mental status much improved/essentially back to baseline by 9/3 - possible infection considered with low grade fevers but no source has been found therefore no abx given/necessary - B12, folate, and TSH normal  - Ativan PRN - MRI pending - now that she is alert will only use ativan - previously was in  post-ictal delirium and despite Ativan was not steady enough for MRI on 9/2  Vitamin D deficiency - 25-hyrdoxy vit D level markedly low at 16 - likely contributing to encephalopathy, but doubt was the sole etiology - begin replacement tx   Low grade fevers - on admission - now resolved  - see above  HYPERTENSION - PO meds resumed - BP improved   SEIZURE DISORDER - meds as per Neuro - EEG unrevelaing - MRI unable to be obtained initially due to tremors/confusion - see  above  Rhabdomyolysis - CK has now essentially normalized   Elevated troponin - very mild - due to rhabdo - resolved/normalized  CKD  stage 3, GFR 30-59 ml/min - Cr has really not increased much from baseline - has been 1.62 and 1.68 this year   Tremor - per Neuro   DIABETES MELLITUS, TYPE II - CBG reasonably controlled at this time - A1c 7.1 - resumed 70/30 at lower dose as she is not eating as much as she does at home  Morbid obesity  Pocketing of food Resolved - advanced to regular diet 9/4  Code Status: FULL Family Communication: spoke w/ sister at bedside Disposition Plan: transfer to Neuro med/surg bed - await MRI - ultimate dispo will be transfer to CIR when medical w/u complete   Consultants: Neuro  Procedures: EEG - 9/2 - slow activity that is most consistent with drowse and sleep - no epileptiform activity is noted  Antibiotics: none  DVT prophylaxis: Heparin  HPI/Subjective: No complaints today.  Alert and quite pleasant.  Denies cp, sob, n/v, abdom pain, or ha.    Objective: Blood pressure 124/57, pulse 59, temperature 98.5 F (36.9 C), temperature source Oral, resp. rate 15, height 5\' 2"  (1.575 m), weight 114 kg (251 lb 5.2 oz), SpO2 97.00%.  Intake/Output Summary (Last 24 hours) at 09/29/12 1019 Last data filed at 09/28/12 2300  Gross per 24 hour  Intake   1060 ml  Output    300 ml  Net    760 ml   Exam: General: No acute respiratory distress Lungs:  Clear to auscultation bilaterally without wheezes or crackles Cardiovascular: Regular rate and rhythm without murmur gallop or rub normal S1 and S2 Abdomen: overweight, nontender, nondistended, soft, bowel sounds positive, no rebound, no ascites, no appreciable mass Extremities: No significant cyanosis, clubbing, or edema bilateral lower extremities Neuro: 5/5 stregth in all extremities, CN 2-12 intact, PERRLA - alert and oriented x3  Data Reviewed: Basic Metabolic Panel:  Recent Labs Lab  09/25/12 1245 09/25/12 1700 09/26/12 0450 09/28/12 0355  NA 134*  --  138 142  K 3.1*  --  3.7 3.8  CL 92*  --  99 106  CO2 23  --  27 28  GLUCOSE 283*  --  184* 189*  BUN 17  --  14 17  CREATININE 1.68* 1.64* 1.43* 1.38*  CALCIUM 9.3  --  8.8 8.5   Liver Function Tests:  Recent Labs Lab 09/25/12 1245 09/26/12 0450  AST 46* 54*  ALT 23 26  ALKPHOS 75 62  BILITOT 0.9 0.8  PROT 7.3 6.8  ALBUMIN 3.2* 2.9*   CBC:  Recent Labs Lab 09/25/12 1245 09/25/12 1700 09/26/12 0450 09/28/12 0355  WBC 9.2 9.5 7.7 3.3*  HGB 13.4 12.2 11.8* 10.3*  HCT 36.4 34.0* 33.2* 30.2*  MCV 85.4 85.6 85.8 89.9  PLT 193 191 199 155   Cardiac Enzymes:  Recent Labs Lab 09/25/12 1245 09/25/12 1621 09/25/12 2205 09/26/12 0450 09/26/12 0457 09/26/12 1800 09/28/12 0355  CKTOTAL 1045*  --   --  962*  --  777* 448*  CKMB 16.6*  --   --   --   --   --   --   TROPONINI  --  0.85* 0.92*  --  0.66*  --  <0.30   CBG:  Recent Labs Lab 09/28/12 0726 09/28/12 1157 09/28/12 1634 09/28/12 2152 09/29/12 0817  GLUCAP 164* 220* 216* 86 71    Recent Results (from the past 240 hour(s))  MRSA PCR SCREENING     Status: None   Collection Time    09/25/12  4:26 PM      Result Value Range Status   MRSA by PCR NEGATIVE  NEGATIVE Final   Comment:            The GeneXpert MRSA Assay (FDA     approved for NASAL specimens     only), is one component of a     comprehensive MRSA colonization     surveillance program. It is not     intended to diagnose MRSA     infection nor to guide or     monitor treatment for     MRSA infections.     Studies:  Recent x-ray studies have been reviewed in detail by the Attending Physician  Scheduled Meds:  Scheduled Meds: . amLODipine  10 mg Oral Daily  . atorvastatin  20 mg Oral QHS  . carbamazepine  400 mg Oral BID  . citalopram  20 mg Oral Daily  . cloNIDine  0.2 mg Oral TID  . ezetimibe  10 mg Oral QHS  . heparin  5,000 Units Subcutaneous Q8H  .  insulin aspart protamine- aspart  10 Units Subcutaneous BID WC  . lacosamide (VIMPAT) IV  200 mg Intravenous Q12H  . LORazepam  1 mg Intravenous On Call  . metoprolol  50 mg Oral BID  . pantoprazole  40 mg Oral Q1200  . sodium chloride  3 mL Intravenous Q12H    Time spent on care of this patient:  68 mins   Kalissa Grays T, MD  [facility] Office  [phone] Pager - Text Page per Amion as per below:  On-Call/Text Page:      [email or web address]      password [email or web address]  If 7PM-7AM, please contact night-coverage [email or web address] Password [email or web address] 09/29/2012, 10:19 AM   LOS: 4 days

## 2012-09-29 NOTE — Progress Notes (Signed)
Speech Language Pathology Dysphagia Treatment Patient Details Name: [name] MRN: [id number] DOB: 1945/09/11 Today's Date: 09/29/2012 Time: 1345-1415 SLP Time Calculation (min): 30 min  Assessment / Plan / Recommendation Clinical Impression  Patient seen for dysphagia treatment with recently upgraded diet to Regular solids, and Thin liquids. SLP directly observed patient with lunch meal , and after providing setup assistance and intermittent to minimal cues for initiation of self-feeding, patient able to feed self. No overt s/s aspiration noted, however patient did exhibit a prolonged oral phase, decreased mastication, and decreased sustained attention when self-feeding, all of whiich contributed to patient requiring extended amount of time during oral intake of PO'.s.     Diet Recommendation  Continue with Current Diet: Thin liquid;Regular    SLP Plan Continue with current plan of care   Pertinent Vitals/Pain    Swallowing Goals     General Temperature Spikes Noted: No Behavior/Cognition: Alert;Confused;Cooperative;Requires cueing;Decreased sustained attention Oral Cavity - Dentition: Dentures, bottom;Dentures, top Patient Positioning: Upright in bed  Oral Cavity - Oral Hygiene     Dysphagia Treatment Treatment focused on: Skilled observation of diet tolerance Treatment Methods/Modalities: Skilled observation Patient observed directly with PO's: Yes Type of PO's observed: Thin liquids Feeding: Able to feed self;Needs set up Liquids provided via: Cup;Straw Oral Phase Signs & Symptoms: Prolonged oral phase;Prolonged mastication Type of cueing: Verbal;Visual Amount of cueing: Minimal   GO     [name] 09/29/2012, 3:13 PM    Paula Baller, MA, CCC-SLP [facility] Speech-Language Pathologist

## 2012-09-29 NOTE — Progress Notes (Signed)
Pt report called to 4N RN, VSS, family aware of transfer, meds current to time. IV team by to attempt PIV placement, will tx to 4N afterwards.

## 2012-09-29 NOTE — Progress Notes (Signed)
Subjective: Family states she seems herself.   Exam: Filed Vitals:   09/29/12 1132  BP:   Pulse:   Temp: 98.5 F (36.9 C)  Resp:    Gen: In bed, NAD MS: Awake, Alert, oriented to place. When asked year, she repeatedly says "twenty-twenty" before finally correcting herself to 2014.  [id number], EOMI, able to count fingers in all visual fields.  Sensory: symmetric to LT   Impression: 67 yo with history of seizure found with AMS that is gradually improving. No signs of infection on exam and patient is improving. I suspect prolonged seizure as an etiology for her presentation. With her continued improvement, I would favor continuing her current regimen at this time. With perseveration, my concern for a frontal infarct continues, and therefore I feel that an MRI is needed. She was unable to complete one with ativan, would favor pursuing sedated MRI.   Recommendations: 1) Continue carbamazepine 400mg  BID 2) Continue vimpat 200mg  Q12H 3) MRI brain scheduled for today.   Roland Rack, MD [facility] [phone]  If 7pm- 7am, please page neurology on call at [phone].

## 2012-09-29 NOTE — Progress Notes (Signed)
I met with patient and her sister at  Bedside. Began discussions concerning rehab options. Patient lives alone in senior apartments and intermittent assist is available. I will contact her local daughter, Juliann Pulse, and begin assessments. [id number]

## 2012-09-30 LAB — GLUCOSE, CAPILLARY
Glucose-Capillary: 69 mg/dL — ABNORMAL LOW (ref 70–99)
Glucose-Capillary: 134 mg/dL — ABNORMAL HIGH (ref 70–99)
Glucose-Capillary: 90 mg/dL (ref 70–99)

## 2012-09-30 MED ORDER — LACOSAMIDE 200 MG PO TABS
100.0000 mg | ORAL_TABLET | Freq: Two times a day (BID) | ORAL | Status: DC
  Administered 2012-09-30 – 2012-10-03 (×6): 100 mg via ORAL
  Filled 2012-09-30 (×2): qty 1
  Filled 2012-09-30: qty 2
  Filled 2012-09-30 (×3): qty 1

## 2012-09-30 MED ORDER — GLUCOSE 4 G PO CHEW
1.0000 | CHEWABLE_TABLET | Freq: Once | ORAL | Status: AC | PRN
  Filled 2012-09-30: qty 1

## 2012-09-30 MED ORDER — DEXTROSE 50 % IV SOLN: 25.0000 mL | INTRAVENOUS | Status: DC | PRN

## 2012-09-30 NOTE — Progress Notes (Signed)
Subjective: MRI normal   Exam: Filed Vitals:   09/30/12 1422  BP: 111/50  Pulse: 55  Temp: 98.1 F (36.7 C)  Resp: 16   Gen: In bed, NAD MS: Awake, Alert, oriented to place. When asked year, she repeatedly says "twenty-twenty" before finally correcting herself to 2014.  [id number], EOMI, able to count fingers in all visual fields.  Sensory: symmetric to LT   Impression: 67 yo with history of seizure found with AMS that is gradually improving. No signs of infection on exam and patient is improving. I suspect prolonged seizure as an etiology for her presentation. With her continued improvement, I would favor continuing her current regimen at this time. With perseveration, my concern for a frontal infarct continues, and therefore I feel that an MRI is needed. She was unable to complete one with ativan, would favor pursuing sedated MRI.   Recommendations: 1) Continue carbamazepine 400mg  BID 2) Vimpat 100mg  PO BID.  3) I would discharge patient on these medications. She has seen [facility] neurology in the past and can follow up with them for seizure control.  4) Neurology will sign off at this time.    Roland Rack, MD [facility] [phone]  If 7pm- 7am, please page neurology on call at [phone].

## 2012-09-30 NOTE — Progress Notes (Signed)
[name] [id number] DOB: 09-04-1945 DOA: 09/25/2012 PCP: Default, Provider, MD  Brief narrative: 67 y.o. female with a history of seizure disorder, diabetes, hypertension, morbid obesity who presented with altered mental status. Patient lives by herself, her family had last talked to her on the phone 3 days before her admit. On Saturday 8/31 family tried to contact her but she was not able to be contacted. One of the sisters went to the patient's house 9/01 and she was found on the floor. Per sister who found her there was no feces or urine near the patient. There was no tongue bite. Patient apparently recognized her sister. EMS was called, patient was then brought to [facility] for further evaluation and treatment. She was found to have generalized intention tremors, weak and was somewhat confused. She was also found to be in acute renal failure, mild rhabdomyolysis with tachycardia and uncontrolled blood pressure.   Past medical history-As per Problem list Consultants:  Neuro  Procedures:  EEG - 9/2 - slow activity that is most consistent with drowse and sleep - no epileptiform activity is noted  Antibiotics:  none    Subjective  [name], weak, not able to stand without 2 people helping her. No n/v/scp currently.  Tolerating PO   Objective    Interim History: nad  Telemetry: Non tele  Objective: Filed Vitals:   09/29/12 1132 09/29/12 1742 09/29/12 2123 09/30/12 0536  BP: 131/61 149/65 156/84 156/82  Pulse:  56 64 61  Temp: 98.5 F (36.9 C) 97.3 F (36.3 C) 97.6 F (36.4 C) 98 F (36.7 C)  TempSrc: Oral Oral Oral Oral  Resp: 19 20 20 20   Height:      Weight:      SpO2: 98% 95% 96% 97%    Intake/Output Summary (Last 24 hours) at 09/30/12 0803 Last data filed at 09/29/12 2224  Gross per 24 hour  Intake    275 ml  Output    300 ml  Net    -25 ml    Exam:  General: Alert pleasant oriented Cardiovascular: S1-S2 no murmur rub or  gallop Respiratory: Clinically clear Abdomen: Soft nontender Skin lower extremity lichenification Neuro grossly intact in all 4 limbs equally but somewhat weak  Data Reviewed: Basic Metabolic Panel:  Recent Labs Lab 09/25/12 1245 09/25/12 1700 09/26/12 0450 09/28/12 0355  NA 134*  --  138 142  K 3.1*  --  3.7 3.8  CL 92*  --  99 106  CO2 23  --  27 28  GLUCOSE 283*  --  184* 189*  BUN 17  --  14 17  CREATININE 1.68* 1.64* 1.43* 1.38*  CALCIUM 9.3  --  8.8 8.5   Liver Function Tests:  Recent Labs Lab 09/25/12 1245 09/26/12 0450  AST 46* 54*  ALT 23 26  ALKPHOS 75 62  BILITOT 0.9 0.8  PROT 7.3 6.8  ALBUMIN 3.2* 2.9*   No results found for this basename: LIPASE, AMYLASE,  in the last 168 hours  Recent Labs Lab 09/28/12 0355  AMMONIA 27   CBC:  Recent Labs Lab 09/25/12 1245 09/25/12 1700 09/26/12 0450 09/28/12 0355  WBC 9.2 9.5 7.7 3.3*  HGB 13.4 12.2 11.8* 10.3*  HCT 36.4 34.0* 33.2* 30.2*  MCV 85.4 85.6 85.8 89.9  PLT 193 191 199 155   Cardiac Enzymes:  Recent Labs Lab 09/25/12 1245 09/25/12 1621 09/25/12 2205 09/26/12 0450 09/26/12 0457 09/26/12 1800 09/28/12 0355  CKTOTAL 1045*  --   --  962*  --  777* 448*  CKMB 16.6*  --   --   --   --   --   --   TROPONINI  --  0.85* 0.92*  --  0.66*  --  <0.30   BNP: No components found with this basename: POCBNP,  CBG:  Recent Labs Lab 09/29/12 0817 09/29/12 1128 09/29/12 1713 09/29/12 2057 09/30/12 0628  GLUCAP 71 86 140* 159* 69*    Recent Results (from the past 240 hour(s))  MRSA PCR SCREENING     Status: None   Collection Time    09/25/12  4:26 PM      Result Value Range Status   MRSA by PCR NEGATIVE  NEGATIVE Final   Comment:            The GeneXpert MRSA Assay (FDA     approved for NASAL specimens     only), is one component of a     comprehensive MRSA colonization     surveillance program. It is not     intended to diagnose MRSA     infection nor to guide or     monitor  treatment for     MRSA infections.     Studies:              All Imaging reviewed and is as per above notation   Scheduled Meds: . amLODipine  10 mg Oral Daily  . atorvastatin  20 mg Oral QHS  . carbamazepine  400 mg Oral BID  . cholecalciferol  50,000 Units Oral Q Fri  . citalopram  20 mg Oral Daily  . cloNIDine  0.2 mg Oral TID  . ezetimibe  10 mg Oral QHS  . heparin  5,000 Units Subcutaneous Q8H  . insulin aspart protamine- aspart  10 Units Subcutaneous BID WC  . lacosamide (VIMPAT) IV  100 mg Intravenous Q12H  . metoprolol  50 mg Oral BID  . pantoprazole  40 mg Oral Q1200  . sodium chloride  3 mL Intravenous Q12H   Continuous Infusions:    Assessment/Plan:  Acute encephalopathy  - possibly post ictal on presentation s/p prolonged seizure  - mental status much improved/essentially back to baseline by 9/3  - possible infection considered with low grade fevers but no source has been found therefore no abx given/necessary  - B12, folate, and TSH normal  - Ativan PRN  - MRI 09/29/12 was neg for new changes  Acute kidney injury-resolving, probably secondary to rhabdomyolysis  Borderline microcytic anemia-repeat blood count in a.m..  Vitamin D deficiency  - 25-hyrdoxy vit D level markedly low at 16  - likely contributing to encephalopathy, but doubt was the sole etiology  - begin replacement tx   Low grade fevers  - on admission - now resolved  - see above  -slightly neutropenic 9/4-repeat CBC in am to confirm/refute  HYPERTENSION  - PO meds resumed - BP improved, on Clonidine 0.2 mg tid, metoprolol 50 bid, amlodipine 10 daily  SEIZURE DISORDER  - meds as per Neuro , currently on Tegretol 400 twice a day, lacosamide 100 twice a day - EEG unrevelaing - MRI as above see above   Rhabdomyolysis  - CK has now essentially normalized   Elevated troponin  - very mild - due to rhabdo - resolved/normalized    Tremor  - per Neuro   DIABETES MELLITUS, TYPE II  - CBG  reasonably controlled at this time - A1c 7.1  - resumed 70/30 at  lower dose [10 units] as she is not eating as much as she does at home   Morbid obesity   Pocketing of food  Resolved - advanced to regular diet 9/4   Code Status: Full Family Communication: Called patient's daughter [phone] Disposition Plan: Inpatient   Verneita Griffes, MD  [facility] Pager [phone] 09/30/2012, 8:03 AM    LOS: 5 days

## 2012-09-30 NOTE — Progress Notes (Signed)
Hypoglycemic Event  CBG: 61   Symptoms Pt asymptomatic orange juice 120 ml given pt eating breakfast  Follow-up CBG: 0900 CBG Result:90  Possible Reasons for Event: did not eat any night time snack  Comments/MD notified:yes to recheck sugar in 30 min done and CBg now 134    [name]  Remember to initiate Hypoglycemia Order Set & complete

## 2012-10-01 LAB — CBC WITH DIFFERENTIAL/PLATELET
Eosinophils Absolute: 0 10*3/uL (ref 0.0–0.7)
Eosinophils Relative: 0 % (ref 0–5)
HCT: 28 % — ABNORMAL LOW (ref 36.0–46.0)
Lymphocytes Relative: 43 % (ref 12–46)
Lymphs Abs: 1.2 10*3/uL (ref 0.7–4.0)
MCH: 31 pg (ref 26.0–34.0)
MCV: 87.8 fL (ref 78.0–100.0)
Monocytes Absolute: 0.5 10*3/uL (ref 0.1–1.0)
Platelets: 182 10*3/uL (ref 150–400)
RBC: 3.19 MIL/uL — ABNORMAL LOW (ref 3.87–5.11)
WBC: 2.7 10*3/uL — ABNORMAL LOW (ref 4.0–10.5)

## 2012-10-01 LAB — GLUCOSE, CAPILLARY
Glucose-Capillary: 121 mg/dL — ABNORMAL HIGH (ref 70–99)
Glucose-Capillary: 167 mg/dL — ABNORMAL HIGH (ref 70–99)
Glucose-Capillary: 166 mg/dL — ABNORMAL HIGH (ref 70–99)

## 2012-10-01 NOTE — Progress Notes (Signed)
[name] [id number] DOB: [birth year] DOA: 09/25/2012 PCP: Default, Provider, MD  Brief narrative: 67 y.o. female with a history of seizure disorder, diabetes, hypertension, morbid obesity who presented with altered mental status. Patient lives by herself, her family had last talked to her on the phone 3 days before her admit. On Saturday 8/31 family tried to contact her but she was not able to be contacted. One of the sisters went to the patient's house 9/01 and she was found on the floor. Per sister who found her there was no feces or urine near the patient. There was no tongue bite. Patient apparently recognized her sister. EMS was called, patient was then brought to [facility] for further evaluation and treatment. She was found to have generalized intention tremors, weak and was somewhat confused. She was also found to be in acute renal failure, mild rhabdomyolysis with tachycardia and uncontrolled blood pressure.   Past medical history-As per Problem list Consultants:  Neuro  Procedures:  EEG - 9/2 - slow activity that is most consistent with drowse and sleep - no epileptiform activity is noted  Antibiotics:  none    Subjective  Alert oriented.  nio distress.  tol po failry well No new sz.  EOMI   Objective    Interim History: nad  Telemetry: Non tele  Objective: Filed Vitals:   09/30/12 2135 09/30/12 2136 10/01/12 0200 10/01/12 0524  BP: 135/56 135/56 160/65 139/57  Pulse: 56  66 52  Temp: 98.2 F (36.8 C)  98.2 F (36.8 C) 98.1 F (36.7 C)  TempSrc: Oral  Oral Oral  Resp: 18  18 20   Height:      Weight:      SpO2: 100%  97% 99%    Intake/Output Summary (Last 24 hours) at 10/01/12 1427 Last data filed at 09/30/12 1800  Gross per 24 hour  Intake    540 ml  Output      0 ml  Net    540 ml    Exam:  General: Alert pleasant oriented Cardiovascular: S1-S2 no murmur rub or gallop Respiratory: Clinically clear Abdomen: Soft nontender Skin  lower extremity lichenification Neuro grossly intact in all 4 limbs equally but somewhat weak  Data Reviewed: Basic Metabolic Panel:  Recent Labs Lab 09/25/12 1245 09/25/12 1700 09/26/12 0450 09/28/12 0355  NA 134*  --  138 142  K 3.1*  --  3.7 3.8  CL 92*  --  99 106  CO2 23  --  27 28  GLUCOSE 283*  --  184* 189*  BUN 17  --  14 17  CREATININE 1.68* 1.64* 1.43* 1.38*  CALCIUM 9.3  --  8.8 8.5   Liver Function Tests:  Recent Labs Lab 09/25/12 1245 09/26/12 0450  AST 46* 54*  ALT 23 26  ALKPHOS 75 62  BILITOT 0.9 0.8  PROT 7.3 6.8  ALBUMIN 3.2* 2.9*   No results found for this basename: LIPASE, AMYLASE,  in the last 168 hours  Recent Labs Lab 09/28/12 0355  AMMONIA 27   CBC:  Recent Labs Lab 09/25/12 1245 09/25/12 1700 09/26/12 0450 09/28/12 0355 10/01/12 0552  WBC 9.2 9.5 7.7 3.3* 2.7*  NEUTROABS  --   --   --   --  1.0*  HGB 13.4 12.2 11.8* 10.3* 9.9*  HCT 36.4 34.0* 33.2* 30.2* 28.0*  MCV 85.4 85.6 85.8 89.9 87.8  PLT 193 191 199 155 182   Cardiac Enzymes:  Recent Labs Lab  09/25/12 1245 09/25/12 1621 09/25/12 2205 09/26/12 0450 09/26/12 0457 09/26/12 1800 09/28/12 0355  CKTOTAL 1045*  --   --  962*  --  777* 448*  CKMB 16.6*  --   --   --   --   --   --   TROPONINI  --  0.85* 0.92*  --  0.66*  --  <0.30   BNP: No components found with this basename: POCBNP,  CBG:  Recent Labs Lab 09/30/12 1624 09/30/12 2140 10/01/12 0149 10/01/12 0656 10/01/12 1158  GLUCAP 214* 129* 95 121* 198*    Recent Results (from the past 240 hour(s))  MRSA PCR SCREENING     Status: None   Collection Time    09/25/12  4:26 PM      Result Value Range Status   MRSA by PCR NEGATIVE  NEGATIVE Final   Comment:            The GeneXpert MRSA Assay (FDA     approved for NASAL specimens     only), is one component of a     comprehensive MRSA colonization     surveillance program. It is not     intended to diagnose MRSA     infection nor to guide or      monitor treatment for     MRSA infections.     Studies:              All Imaging reviewed and is as per above notation   Scheduled Meds: . amLODipine  10 mg Oral Daily  . atorvastatin  20 mg Oral QHS  . carbamazepine  400 mg Oral BID  . cholecalciferol  50,000 Units Oral Q Fri  . citalopram  20 mg Oral Daily  . cloNIDine  0.2 mg Oral TID  . ezetimibe  10 mg Oral QHS  . heparin  5,000 Units Subcutaneous Q8H  . insulin aspart protamine- aspart  10 Units Subcutaneous BID WC  . lacosamide  100 mg Oral BID  . metoprolol  50 mg Oral BID  . pantoprazole  40 mg Oral Q1200  . sodium chloride  3 mL Intravenous Q12H   Continuous Infusions:   Acute encephalopathy  - possibly post ictal on presentation s/p prolonged seizure  - mental status much improved/essentially back to baseline by 9/3  - possible infection considered with low grade fevers but no source has been found therefore no abx given/necessary  - B12, folate, and TSH normal  - Ativan PRN  - MRI 09/29/12 was neg for new changes  Acute kidney injury-resolving, probably secondary to rhabdomyolysis.  Rpt Bmet 10/01/12  Borderline microcytic anemia-repeat blood count in a.m--Blood count stable between 9-11  Mild Leukopenia-unclear etioology-could be tegretol?  monitor  Vitamin D deficiency  - 25-hydroxy vit D level markedly low at 16  - likely contributing to encephalopathy, but doubt was the sole etiology  - begin replacement tx   Low grade fevers  - on admission - now resolved  - see above  -slightly neutropenic 9/4-repeat CBC in am to confirm/refute  HYPERTENSION  - PO meds resumed - BP improved, on Clonidine 0.2 mg tid, metoprolol 50 bid, amlodipine 10 daily  SEIZURE DISORDER  - meds as per Neuro , currently on Tegretol 400 twice a day, lacosamide 100 twice a day - EEG unrevelaing - MRI as above see above   Rhabdomyolysis  - CK has now essentially normalized   Elevated troponin  - very mild - due to  rhabdo -  resolved/normalized    Tremor  - per Neuro   DIABETES MELLITUS, TYPE II  - CBG reasonably controlled at this time - A1c 7.1  - resumed 70/30 at lower dose [10 units] as she is not eating as much as she does at home   Morbid obesity   Pocketing of food  Resolved - advanced to regular diet 9/4   Code Status: Full Family Communication: Called patient's daughter 09/30/12 and extensively discussed Disposition Plan: Inpatient   Verneita Griffes, MD  [facility] Pager [phone] 10/01/2012, 2:27 PM    LOS: 6 days

## 2012-10-02 LAB — CBC WITH DIFFERENTIAL/PLATELET
Hemoglobin: 10 g/dL — ABNORMAL LOW (ref 12.0–15.0)
Lymphocytes Relative: 42 % (ref 12–46)
Lymphs Abs: 1.1 10*3/uL (ref 0.7–4.0)
MCV: 87.6 fL (ref 78.0–100.0)
Monocytes Relative: 15 % — ABNORMAL HIGH (ref 3–12)
Neutrophils Relative %: 42 % — ABNORMAL LOW (ref 43–77)
Platelets: 199 10*3/uL (ref 150–400)
RBC: 3.22 MIL/uL — ABNORMAL LOW (ref 3.87–5.11)
WBC: 2.6 10*3/uL — ABNORMAL LOW (ref 4.0–10.5)

## 2012-10-02 LAB — BASIC METABOLIC PANEL
BUN: 15 mg/dL (ref 6–23)
Calcium: 9 mg/dL (ref 8.4–10.5)
Creatinine, Ser: 1.32 mg/dL — ABNORMAL HIGH (ref 0.50–1.10)
GFR calc non Af Amer: 41 mL/min — ABNORMAL LOW (ref >90)
Glucose, Bld: 173 mg/dL — ABNORMAL HIGH (ref 70–99)

## 2012-10-02 LAB — GLUCOSE, CAPILLARY: Glucose-Capillary: 166 mg/dL — ABNORMAL HIGH (ref 70–99)

## 2012-10-02 MED ORDER — FUROSEMIDE 80 MG PO TABS: 80.0000 mg | ORAL_TABLET | Freq: Every day | ORAL | Status: DC

## 2012-10-02 MED ORDER — LACOSAMIDE 100 MG PO TABS: 100.0000 mg | ORAL_TABLET | Freq: Two times a day (BID) | ORAL | Status: DC

## 2012-10-02 MED ORDER — INSULIN ASPART PROT & ASPART (70-30 MIX) 100 UNIT/ML ~~LOC~~ SUSP: 10.0000 [IU] | Freq: Two times a day (BID) | SUBCUTANEOUS | Status: DC

## 2012-10-02 NOTE — Progress Notes (Signed)
I contacted pt's local daughter, Tye Maryland, by phone. She feels pt will need longterm SNF for she lives alone and does not have 24/7 assist at home which is of concern for herself as well as her sister in [place]. I have alerted RN CM that Tye Maryland coming to hospital in about an hour and available to SW to assist with SNF placement. We will sign off. [phone]

## 2012-10-02 NOTE — Progress Notes (Signed)
Physical Therapy Treatment Patient Details Name: [name] MRN: [id number] DOB: 23-Jul-1945 Today's Date: 10/02/2012 Time: [id number] PT Time Calculation (min): 25 min  PT Assessment / Plan / Recommendation  History of Present Illness 67 y.o. female admitted to [facility] on 09/25/12 with a Past Medical History of seizure disorder, diabetes, hypertension, morbid obesity was found on the floor near the kitchen. Per his sister who found her, there was no feces or urine near the patient. There was no tongue bite. Patient apparently recognized her sister. EMS was called, patient was then brought to [facility] for further evaluation and treatment. She was found to have generalized intention tremors, weak and somewhat confused. She was also found to be in acute renal failure, mild rhabdomyolysis with tachycardia and uncontrolled blood pressure.   MRI pending.     PT Comments   Pt continues to make progress every session with PT.  She continues to be appropriate for CIR level therapies (she would be able to tolerate the intensity level); however, based on the rehab admission's coordinator's note from today, family feels like she will need a longer therapy stay as they are unable to provide the support needed at discharge, so they are planning on pursuing SNF placement at this time.  PT will continue to follow acutely.    Follow Up Recommendations  CIR     Does the patient have the potential to tolerate intense rehabilitation    Yes  Barriers to Discharge  Decreased caregiver support when she returns home      Equipment Recommendations  Rolling walker with 5" wheels;3in1 (PT)    Recommendations for Other Services   None  Frequency Min 3X/week   Progress towards PT Goals Progress towards PT goals: Progressing toward goals  Plan Current plan remains appropriate    Precautions / Restrictions Precautions Precautions: Fall Restrictions Weight Bearing Restrictions: No   Pertinent Vitals/Pain See  vitals flow sheet.     Mobility  Bed Mobility Bed Mobility: Not assessed (pt OOB in bathroom with RN student) Transfers Sit to Stand: 4: Min guard;Without upper extremity assist;With armrests;From chair/3-in-1 Stand to Sit: 4: Min guard;Without upper extremity assist;With armrests;To chair/3-in-1 Details for Transfer Assistance: min guard assist to steady pt for balance during transitions from high sitting surfaces. Pt relies heavily on arms for support and balance during transitions.   Ambulation/Gait Ambulation/Gait Assistance: 4: Min assist Ambulation Distance (Feet): 90 Feet Assistive device: Rolling walker Ambulation/Gait Assistance Details: min verbal cues for safe use of RW, min assist to steady pt for balance and help her steer RW around obstacles.  Pt needed encouragement to walk outside of room after spending some time toileting, she reported fatigues.   Gait Pattern: Step-through pattern;Shuffle;Trunk flexed Gait velocity: less than 1.8 ft/sec indicating risk for recurrent falls.  Pt needed 3 standing rest breaks due to fatigue while walking.  Rest breaks < 1 min      PT Goals (current goals can now be found in the care plan section) Acute Rehab PT Goals Patient Stated Goal: to return to prior level of independence.    Visit Information  Last PT Received On: 10/02/12 Assistance Needed: +1 History of Present Illness: 67 y.o. female admitted to [facility] on 09/25/12 with a Past Medical History of seizure disorder, diabetes, hypertension, morbid obesity was found on the floor near the kitchen. Per his sister who found her, there was no feces or urine near the patient. There was no tongue bite. Patient apparently recognized her  sister. EMS was called, patient was then brought to [facility] for further evaluation and treatment. She was found to have generalized intention tremors, weak and somewhat confused. She was also found to be in acute renal failure, mild rhabdomyolysis with  tachycardia and uncontrolled blood pressure.   MRI pending.      Subjective Data  Subjective: Pt reports that she is a bit upset about having to go to a rehab center before going home.  PT encouraged her to work hard with her therapies and she will get to go home sooner.   Patient Stated Goal: to return to prior level of independence.     Cognition  Cognition Arousal/Alertness: Awake/alert Behavior During Therapy: WFL for tasks assessed/performed Overall Cognitive Status: Within Functional Limits for tasks assessed (not specifically tested today, but conversation WNL)    Balance  Dynamic Standing Balance Dynamic Standing - Balance Support: Left upper extremity supported;During functional activity Dynamic Standing - Level of Assistance: 4: Min assist Dynamic Standing - Comments: min assist to support pt while she attempted to do her own pericare.  This is getting harder for her to do at home even before she came to the hospital.  Some of that is due to body habitus and some is due to balance needed to bend and twist to get to her behind.  Pt tried both twisting and coming from the front and still needed max assist to actually clean her peri area.    End of Session PT - End of Session Activity Tolerance: Patient limited by fatigue Patient left: in chair;with call bell/phone within reach        [place] B. [place], [place], DPT [phone]   10/02/2012, 12:42 PM

## 2012-10-02 NOTE — Discharge Summary (Signed)
Physician Discharge Summary  SHANTALE ERICSSON [id number] DOB: [birth year] DOA: 09/25/2012  PCP: Default, Provider, MD  Admit date: 09/25/2012 Discharge date: 10/02/2012  Time spent: 40  Recommendations for Outpatient Follow-up:  1. Recommend rechekcing CBc by 1 week 2. Neurology to follow patient to help mange meds 3. Needs Skilled therapy at SNF-Family aware 4. A1c in 1 month 5. Dietary counselling as OP 6. F/ou with Pulm for scheduled appt  Discharge Diagnoses:  Principal Problem:   Acute encephalopathy Active Problems:   DIABETES MELLITUS, TYPE II   HYPERLIPIDEMIA   Morbid obesity   HYPERTENSION, BENIGN   SEIZURE DISORDER   CKD (chronic kidney disease) stage 3, GFR 30-59 ml/min   Tremor   Rhabdomyolysis   Elevated troponin   Discharge Condition: fair  Diet recommendation: heart healthy, diabetics [non compliant in hospital]  Filed Weights   09/29/12 0500 10/02/12 0500 10/02/12 0700  Weight: 114 kg (251 lb 5.2 oz) 116.263 kg (256 lb 5 oz) 116.03 kg (255 lb 12.8 oz)    History of present illness:  67 y.o. female with a history of seizure disorder, diabetes, hypertension, morbid obesity who presented with altered mental status. Patient lives by herself, her family had last talked to her on the phone 3 days before her admit. On Saturday 8/31 family tried to contact her but she was not able to be contacted. One of the sisters went to the patient's house 9/01 and she was found on the floor. Per sister who found her there was no feces or urine near the patient. There was no tongue bite. Patient apparently recognized her sister. EMS was called, patient was then brought to [facility] for further evaluation and treatment. She was found to have generalized intention tremors, weak and was somewhat confused. She was also found to be in acute renal failure, mild rhabdomyolysis with tachycardia and uncontrolled blood pressure   Hospital Course:  Acute encephalopathy  - possibly  post ictal on presentation s/p prolonged seizure  - mental status much improved/essentially back to baseline by 9/3  - possible infection considered with low grade fevers but no source has been found therefore no abx given/necessary  - B12, folate, and TSH normal  - MRI 09/29/12 was neg for new changes  Acute kidney injury-resolving, probably secondary to rhabdomyolysis. Rpt Bmet 10/02/12 was bnl Borderline microcytic anemia-repeat blood count in a.m--Blood count stable between 9-11  Mild Leukopenia-unclear etiology-could be tegretol? monitor might need adjustment as OP Vitamin D deficiency  - 25-hydroxy vit D level markedly low at 16  - likely contributing to encephalopathy, but doubt was the sole etiology  - begin replacement tx  Low grade fevers  - on admission - now resolved  - see above  -slightly neutropenic 9/4-attirbutable likely to tegretol.  Would ask [facility] neuro to comment as OP HYPERTENSION  - PO meds resumed - BP improved, on Clonidine 0.2 mg tid, metoprolol 50 bid, amlodipine 10 daily  SEIZURE DISORDER  - meds as per Neuro , currently on Tegretol 400 twice a day, lacosamide 100 twice a day  - EEG unrevelaing - MRI as above see above  Rhabdomyolysis  - CK has now essentially normalized  Elevated troponin  - very mild - due to rhabdo - resolved/normalized  Tremor  - per Neuro  DIABETES MELLITUS, TYPE II  - CBG reasonably controlled at this time - A1c 7.1  - resumed 70/30 at lower dose [10 units] as she is not eating as much as she does  at home  Morbid obesity  Pocketing of food-resolved Resolved - advanced to regular diet 9/4 Humalog at her  Consultants:  Neuro  Procedures:  EEG - 9/2 - slow activity that is most consistent with drowse and sleep - no epileptiform activity is noted  Antibiotics:  none  Discharge Exam: Filed Vitals:   10/02/12 1006  BP: 199/81  Pulse: 67  Temp: 97.7 F (36.5 C)  Resp: 16    Awake alert oriented.  Scared to leave  hosptial General: eomi, ncat, cuhcingoid, no pallor ict Cardiovascular: s1 s2 no m/r/g Respiratory: clear  Discharge Instructions  Discharge Orders   Future Appointments Provider Department Dept Phone   10/17/2012 9:30 AM Kathee Delton, MD [facility] [phone]   Future Orders Complete By Expires   Diet - low sodium heart healthy  As directed    Discharge instructions  As directed    Comments:     You were cared for by a hospitalist during your hospital stay. If you have any questions about your discharge medications or the care you received while you were in the hospital after you are discharged, you can call the unit and asked to speak with the hospitalist on call if the hospitalist that took care of you is not available. Once you are discharged, your primary care physician will handle any further medical issues. Please note that NO REFILLS for any discharge medications will be authorized once you are discharged, as it is imperative that you return to your primary care physician (or establish a relationship with a primary care physician if you do not have one) for your aftercare needs so that they can reassess your need for medications and monitor your lab values. If you do not have a primary care physician, you can call [phone] for a physician referral.   Increase activity slowly  As directed        Medication List    STOP taking these medications       ezetimibe-simvastatin 10-40 MG per tablet  Commonly known as:  VYTORIN     naproxen 500 MG tablet  Commonly known as:  NAPROSYN      TAKE these medications       amLODipine 10 MG tablet  Commonly known as:  NORVASC  Take 10 mg by mouth daily.     carbamazepine 200 MG tablet  Commonly known as:  TEGRETOL  Take 400 mg by mouth 2 (two) times daily.     citalopram 20 MG tablet  Commonly known as:  CELEXA  Take 20 mg by mouth daily.     cloNIDine 0.2 MG tablet  Commonly known as:  CATAPRES  Take 0.2 mg by  mouth 2 (two) times daily.     furosemide 80 MG tablet  Commonly known as:  LASIX  Take 1 tablet (80 mg total) by mouth daily.     insulin aspart protamine- aspart (70-30) 100 UNIT/ML injection  Commonly known as:  NOVOLOG MIX 70/30  Inject 0.1 mLs (10 Units total) into the skin 2 (two) times daily with a meal.     Lacosamide 100 MG Tabs  Take 1 tablet (100 mg total) by mouth 2 (two) times daily.     metoprolol 50 MG tablet  Commonly known as:  LOPRESSOR  Take 50 mg by mouth daily.     omeprazole 40 MG capsule  Commonly known as:  PRILOSEC  Take 40 mg by mouth daily.       No  Known Allergies    The results of significant diagnostics from this hospitalization (including imaging, microbiology, ancillary and laboratory) are listed below for reference.    Significant Diagnostic Studies: Ct Head Wo Contrast  09/25/2012   *RADIOLOGY REPORT*  Clinical Data: Altered mental status.  CT HEAD WITHOUT CONTRAST  Technique:  Contiguous axial images were obtained from the base of the skull through the vertex without contrast.  Comparison: 10/20/2007  Findings: There is progression of small vessel ischemic changes in the periventricular white matter since the prior study.  Increased prominence of cortical atrophy also noted since 2009. The brain demonstrates no evidence of hemorrhage, infarction, edema, mass effect, extra-axial fluid collection, hydrocephalus or mass lesion. The skull is unremarkable.  IMPRESSION: No acute findings.  There is evidence of progression of small vessel disease and atrophy since 2009.   Original Report Authenticated By: Aletta Edouard, M.D.   Mr Brain Wo Contrast  09/29/2012   *RADIOLOGY REPORT*  Clinical Data: Stroke.  MRI HEAD WITHOUT CONTRAST  Technique:  Multiplanar, multiecho pulse sequences of the brain and surrounding structures were obtained according to standard protocol without intravenous contrast.  Comparison: Head CT 09/25/2012  Findings: The study suffers  from patient motion despite the technologist's best efforts.  Diffusion imaging does not show any acute or subacute infarction. There are chronic small vessel changes of the pons.  No focal cerebellar insult.  The cerebral hemispheres show mild chronic small vessel changes of the deep white matter.  No evidence of mass lesion, hemorrhage, hydrocephalus or extra-axial collection.  No pituitary mass.  No inflammatory sinus disease.  Major vessels are patent at the base of the brain.  No skull or skull base lesion.  IMPRESSION: Motion degraded examination.  Mild chronic small vessel change.  No sign of acute or subacute insult.   Original Report Authenticated By: Nelson Chimes, M.D.   Dg Chest Port 1 View  09/25/2012   CLINICAL DATA:  Shortness of breath, confusion.  EXAM: PORTABLE CHEST - 1 VIEW  COMPARISON:  04/03/2010  FINDINGS: Mild cardiomegaly, vascular congestion. Right base atelectasis. Low lung volumes. No effusions or acute bony abnormality. No overt edema.  IMPRESSION: Cardiomegaly with vascular congestion and right base atelectasis.   Electronically Signed   By: Rolm Baptise   On: 09/25/2012 14:53    Microbiology: Recent Results (from the past 240 hour(s))  MRSA PCR SCREENING     Status: None   Collection Time    09/25/12  4:26 PM      Result Value Range Status   MRSA by PCR NEGATIVE  NEGATIVE Final   Comment:            The GeneXpert MRSA Assay (FDA     approved for NASAL specimens     only), is one component of a     comprehensive MRSA colonization     surveillance program. It is not     intended to diagnose MRSA     infection nor to guide or     monitor treatment for     MRSA infections.     Labs: Basic Metabolic Panel:  Recent Labs Lab 09/25/12 1245 09/25/12 1700 09/26/12 0450 09/28/12 0355  NA 134*  --  138 142  K 3.1*  --  3.7 3.8  CL 92*  --  99 106  CO2 23  --  27 28  GLUCOSE 283*  --  184* 189*  BUN 17  --  14 17  CREATININE 1.68* 1.64*  1.43* 1.38*  CALCIUM 9.3   --  8.8 8.5   Liver Function Tests:  Recent Labs Lab 09/25/12 1245 09/26/12 0450  AST 46* 54*  ALT 23 26  ALKPHOS 75 62  BILITOT 0.9 0.8  PROT 7.3 6.8  ALBUMIN 3.2* 2.9*   No results found for this basename: LIPASE, AMYLASE,  in the last 168 hours  Recent Labs Lab 09/28/12 0355  AMMONIA 27   CBC:  Recent Labs Lab 09/25/12 1700 09/26/12 0450 09/28/12 0355 10/01/12 0552 10/02/12 0430  WBC 9.5 7.7 3.3* 2.7* 2.6*  NEUTROABS  --   --   --  1.0* 1.1*  HGB 12.2 11.8* 10.3* 9.9* 10.0*  HCT 34.0* 33.2* 30.2* 28.0* 28.2*  MCV 85.6 85.8 89.9 87.8 87.6  PLT 191 199 155 182 199   Cardiac Enzymes:  Recent Labs Lab 09/25/12 1245 09/25/12 1621 09/25/12 2205 09/26/12 0450 09/26/12 0457 09/26/12 1800 09/28/12 0355  CKTOTAL 1045*  --   --  962*  --  777* 448*  CKMB 16.6*  --   --   --   --   --   --   TROPONINI  --  0.85* 0.92*  --  0.66*  --  <0.30   BNP: BNP (last 3 results) No results found for this basename: PROBNP,  in the last 8760 hours CBG:  Recent Labs Lab 10/01/12 0656 10/01/12 1158 10/01/12 1722 10/01/12 2111 10/02/12 0651  GLUCAP 121* 198* 167* 166* 166*       Signed:  Nita Sells  [facility] 10/02/2012, 10:10 AM

## 2012-10-02 NOTE — Progress Notes (Signed)
Spoke with CSW regarding discharge plans. Unable to get to SNF today.  Pt planned [facility].

## 2012-10-03 LAB — GLUCOSE, CAPILLARY
Glucose-Capillary: 68 mg/dL — ABNORMAL LOW (ref 70–99)
Glucose-Capillary: 180 mg/dL — ABNORMAL HIGH (ref 70–99)
Glucose-Capillary: 189 mg/dL — ABNORMAL HIGH (ref 70–99)

## 2012-10-03 NOTE — Clinical Social Work Psychosocial (Signed)
Clinical Social Work Department BRIEF PSYCHOSOCIAL ASSESSMENT 10/03/2012  Patient:  [name], [name]     Account Number:  [id number]     Admit date:  09/25/2012  Clinical Social Worker:  Donna Christen  Date/Time:  10/03/2012 12:33 PM  Referred by:  Physician  Date Referred:  10/02/2012 Referred for  SNF Placement   Other Referral:   none.   Interview type:  Patient Other interview type:   CSW also spoke with pt's daughter, [name].    PSYCHOSOCIAL DATA Living Status:  ALONE Admitted from facility:   Level of care:   Primary support name:  [name] (sister) and [name] (daughter) Primary support relationship to patient:  FAMILY Degree of support available:   Strong.    CURRENT CONCERNS Current Concerns  Post-Acute Placement   Other Concerns:   none.    SOCIAL WORK ASSESSMENT / PLAN CSW met with pt at bedside. Pt stated that she would like for CSW to speak with pt's daughter, [name] regarding discharge planning. Pt stated that prior to admission, pt was living at home alone. Pt noted that her sister, [name], would come over to pt's home on a weekly basis to help her with cleaning and other chores. Pt stated that she has a strong support from family. CSW to continue to follow and assist with discharge planning needs.   Assessment/plan status:  Psychosocial Support/Ongoing Assessment of Needs Other assessment/ plan:   none.   Information/referral to community resources:   [facility] placement.    PATIENTS/FAMILYS RESPONSE TO PLAN OF CARE: Pt and pt's daughter are understanding and agreeable to CSW plan of care.       Pati Gallo, MSW, [place] Social Work [phone]

## 2012-10-03 NOTE — Clinical Social Work Placement (Signed)
Clinical Social Work Department CLINICAL SOCIAL WORK PLACEMENT NOTE 10/03/2012  Patient:  [name], [name]  Account Number:  [id number] Admit date:  09/25/2012  Clinical Social Worker:  Raquel Sarna [place], LCSWA  Date/time:  10/03/2012 12:38 PM  Clinical Social Work is seeking post-discharge placement for this patient at the following level of care:   [facility]   (*CSW will update this form in Epic as items are completed)   10/02/2012  Patient/family provided with [facility] Department of Clinical Social Works list of facilities offering this level of care within the geographic area requested by the patient (or if unable, by the patients family).  10/02/2012  Patient/family informed of their freedom to choose among providers that offer the needed level of care, that participate in Medicare, Medicaid or managed care program needed by the patient, have an available bed and are willing to accept the patient.  10/02/2012  Patient/family informed of [facility] ownership interest in [facility], as well as of the fact that they are under no obligation to receive care at this facility.  PASARR submitted to EDS on 10/03/2012 PASARR number received from EDS on 10/03/2012  FL2 transmitted to all facilities in geographic area requested by pt/family on  10/02/2012 FL2 transmitted to all facilities within larger geographic area on   Patient informed that his/her managed care company has contracts with or will negotiate with  certain facilities, including the following:     Patient/family informed of bed offers received:  10/02/2012 Patient chooses bed at [facility] [place] Physician recommends and patient chooses bed at  [facility] [place]  Patient to be transferred to [facility] [place] on  10/03/2012 Patient to be transferred to facility by [facility]  The following physician request were entered in Epic:   Additional Comments:  Pati Gallo, MSW, [place] Work [phone]

## 2012-10-03 NOTE — Progress Notes (Signed)
Physical Therapy Treatment Patient Details Name: YULIE VERGA MRN: [id number] DOB: Oct 23, 1945 Today's Date: 10/03/2012 Time: 1335-1400 PT Time Calculation (min): 25 min  PT Assessment / Plan / Recommendation  History of Present Illness 67 y.o. female admitted to [facility] on 09/25/12 with a Past Medical History of seizure disorder, diabetes, hypertension, morbid obesity was found on the floor near the kitchen. Per his sister who found her, there was no feces or urine near the patient. There was no tongue bite. Patient apparently recognized her sister. EMS was called, patient was then brought to [facility] for further evaluation and treatment. She was found to have generalized intention tremors, weak and somewhat confused. She was also found to be in acute renal failure, mild rhabdomyolysis with tachycardia and uncontrolled blood pressure.   MRI pending.     PT Comments   Pt able to increase ambulation distance however continues to fatigue with increase assistance needed due to fatigue.  Pt able to complete pericare independently.  Updated d/c plans due to family suggesting long term SNF placement.   Follow Up Recommendations  SNF     Equipment Recommendations  Rolling walker with 5" wheels;3in1 (PT)    Frequency Min 3X/week   Progress towards PT Goals Progress towards PT goals: Progressing toward goals  Plan Discharge plan needs to be updated    Precautions / Restrictions Precautions Precautions: Fall Precaution Comments: weak bil legs Restrictions Weight Bearing Restrictions: No   Pertinent Vitals/Pain C/o bilateral LE due to fatigue towards end of session but does not rate     Mobility  Bed Mobility Bed Mobility: Not assessed Transfers Transfers: Sit to Stand;Stand to Sit;Stand Pivot Transfers Sit to Stand: 4: Min guard;With upper extremity assist;From chair/3-in-1 Stand to Sit: 4: Min guard;With upper extremity assist;To chair/3-in-1 Details for Transfer Assistance: min  guard assist to steady pt for balance during transitions from high sitting surfaces. Pt relies heavily on arms for support and balance during transitions.   Ambulation/Gait Ambulation/Gait Assistance: 4: Min assist Ambulation Distance (Feet): 150 Feet Assistive device: Rolling walker Ambulation/Gait Assistance Details: (A) to manage RW towards the end of ambulation due to fatigue.  Pt continue to veer into objects with RW when fatigued.  Cues for upright posture. Gait Pattern: Step-through pattern;Shuffle;Trunk flexed Stairs: No    Exercises     PT Diagnosis:    PT Problem List:   PT Treatment Interventions:     PT Goals (current goals can now be found in the care plan section) Acute Rehab PT Goals Patient Stated Goal: to return to prior level of independence.   PT Goal Formulation: With patient Time For Goal Achievement: 10/10/12 Potential to Achieve Goals: Good  Visit Information  Last PT Received On: 10/03/12 Assistance Needed: +1 History of Present Illness: 67 y.o. female admitted to [facility] on 09/25/12 with a Past Medical History of seizure disorder, diabetes, hypertension, morbid obesity was found on the floor near the kitchen. Per his sister who found her, there was no feces or urine near the patient. There was no tongue bite. Patient apparently recognized her sister. EMS was called, patient was then brought to [facility] for further evaluation and treatment. She was found to have generalized intention tremors, weak and somewhat confused. She was also found to be in acute renal failure, mild rhabdomyolysis with tachycardia and uncontrolled blood pressure.   MRI pending.      Subjective Data  Subjective: "I'm not sure how far I can go but I'll  try." Patient Stated Goal: to return to prior level of independence.     Cognition  Cognition Arousal/Alertness: Awake/alert Behavior During Therapy: WFL for tasks assessed/performed Overall Cognitive Status: Impaired/Different from  baseline Area of Impairment: Orientation;Memory;Awareness Orientation Level: Disoriented to;Place Current Attention Level: Sustained Memory: Decreased short-term memory General Comments: Pt unaware of pending d/c to [facility] today. Pt states "what am I doing?" Pt educated that daughter will arrive at [facility] and bring clothing .    Balance     End of Session PT - End of Session Equipment Utilized During Treatment: Gait belt Activity Tolerance: Patient limited by fatigue Patient left: in chair;with call bell/phone within reach Nurse Communication: Mobility status   GP     Kaylub Detienne 10/03/2012, 4:08 PM  Antoine Poche, [place] DPT [phone]

## 2012-10-03 NOTE — Progress Notes (Signed)
No changes from d./c summary yesterday Patient comfortable, no c.o's  Verneita Griffes, MD [facility] [phone]

## 2012-10-03 NOTE — Progress Notes (Signed)
Speech Language Pathology Dysphagia Treatment Patient Details Name: [name] MRN: [id number] DOB: 11/19/1945 Today's Date: 10/03/2012 Time: [id number] SLP Time Calculation (min): 25 min  Assessment / Plan / Recommendation Clinical Impression  Pt seen for skilled dysphagia treatment to assess tolerance of diet and for education to aspiration mitigation.  Pt observed consuming graham crackers and orange juice, prolonged mastication but no clinical indication of aspiration.  Pt does admit to recently becoming choked in a restaurant on food - that caused her to vomit when she got home.  She is on a PPI per chart review.  Xerostomia also reported by pt, for which slp advised pt to compensations.  Pt reports waking in the middle of the night with coughing - she questions reflux symptoms.  SLP provided pt with reflux precautions in writing given she is taking a PPI.  Advised pt to monitor symptoms and inform MD as indicated.  No furhter SLP indicated as all education is completed, thanks.      Diet Recommendation  Continue with Current Diet: Regular;Thin liquid    SLP Plan All goals met   Pertinent Vitals/Pain Afebrile, decreased   Swallowing Goals  SLP Swallowing Goals Swallow Study Goal #2 - Progress: Met  General Temperature Spikes Noted: No Respiratory Status: Room air Behavior/Cognition: Alert;Cooperative;Pleasant mood Oral Cavity - Dentition: Dentures, top;Dentures, bottom Patient Positioning: Upright in bed    Dysphagia Treatment Treatment focused on: Skilled observation of diet tolerance;Upgraded PO texture trials;Patient/family/caregiver education Family/Caregiver Educated: pt Treatment Methods/Modalities: Skilled observation Patient observed directly with PO's: Yes Type of PO's observed: Thin liquids;Regular Liquids provided via: Straw Pharyngeal Phase Signs & Symptoms: Suspected delayed swallow initiation Type of cueing: Verbal Amount of cueing: Minimal (to cease talking  with mouth full)   Higgins, [place] [facility] SLP [phone]

## 2012-10-03 NOTE — Progress Notes (Signed)
Occupational Therapy Treatment Patient Details Name: [name] MRN: [id number] DOB: 01-29-1945 Today's Date: 10/03/2012 Time: [id number] OT Time Calculation (min): 23 min  OT Assessment / Plan / Recommendation  History of present illness 67 y.o. female admitted to [facility] on 09/25/12 with a Past Medical History of seizure disorder, diabetes, hypertension, morbid obesity was found on the floor near the kitchen. Per his sister who found her, there was no feces or urine near the patient. There was no tongue bite. Patient apparently recognized her sister. EMS was called, patient was then brought to [facility] for further evaluation and treatment. She was found to have generalized intention tremors, weak and somewhat confused. She was also found to be in acute renal failure, mild rhabdomyolysis with tachycardia and uncontrolled blood pressure.   MRI pending.     OT comments  Pt progressing this session with therapy. Pt needed verbal encouragement to keep progressing. Pt attempting to terminate ambulation but with motivation continued. Pt completed toilet transfer and performed personal hygiene this session. Pt with pending d/c to [facility] today.  Follow Up Recommendations  SNF    Barriers to Discharge       Equipment Recommendations  3 in 1 bedside comode;Other (comment) (Rw)    Recommendations for Other Services    Frequency Min 2X/week   Progress towards OT Goals Progress towards OT goals: Progressing toward goals  Plan Discharge plan needs to be updated    Precautions / Restrictions Precautions Precautions: Fall   Pertinent Vitals/Pain Fatigue with ambulation No pain reported    ADL  Eating/Feeding: Modified independent Where Assessed - Eating/Feeding: Chair Grooming: Wash/dry hands;Min guard Where Assessed - Grooming: Unsupported standing Toilet Transfer: Minimal assistance Toilet Transfer Method: Sit to Loss adjuster, chartered: Regular height toilet;Grab bars  (mod v/c ) Toileting - Clothing Manipulation and Hygiene: Minimal assistance Where Assessed - Toileting Clothing Manipulation and Hygiene: Sit to stand from 3-in-1 or toilet Equipment Used: Rolling walker Transfers/Ambulation Related to ADLs: Pt ambulated with RW and needed to stop x2 for rest breaks. pt also must stop with cognitive challenges. pt is unable to hold conversation and ambulate due to breath control and cognitive deficits ADL Comments: Pt in chair on arrival. pt demonstrates cognitive deficits. pt inquiring where therapist are from and needed question cues to obtain patient was currently at [facility]. pt speaking about random topics not directly related to current task. Pt reports needing (A) with peri care but this session static standing and reaching with a squat for peri care.    OT Diagnosis:    OT Problem List:   OT Treatment Interventions:     OT Goals(current goals can now be found in the care plan section) Acute Rehab OT Goals Patient Stated Goal: to return to prior level of independence.   OT Goal Formulation: With family Time For Goal Achievement: 10/12/12 Potential to Achieve Goals: Good ADL Goals Pt Will Perform Upper Body Bathing: with modified independence;sitting Pt Will Perform Upper Body Dressing: with modified independence;sitting Pt Will Transfer to Toilet: with modified independence;ambulating;bedside commode Pt Will Perform Toileting - Clothing Manipulation and hygiene: with modified independence;sit to/from stand Additional ADL Goal #1: Demonstrate anticipatory awareness during ADL task Additional ADL Goal #2: complete 3 step ADL task wihout redirection in mildly distracting environment  Visit Information  Last OT Received On: 10/03/12 Assistance Needed: +1 History of Present Illness: 67 y.o. female admitted to [facility] on 09/25/12 with a Past Medical History of seizure disorder, diabetes, hypertension,  morbid obesity was found on the floor near the kitchen. Per his  sister who found her, there was no feces or urine near the patient. There was no tongue bite. Patient apparently recognized her sister. EMS was called, patient was then brought to [facility] for further evaluation and treatment. She was found to have generalized intention tremors, weak and somewhat confused. She was also found to be in acute renal failure, mild rhabdomyolysis with tachycardia and uncontrolled blood pressure.   MRI pending.      Subjective Data      Prior Functioning       Cognition  Cognition Arousal/Alertness: Awake/alert Behavior During Therapy: WFL for tasks assessed/performed Overall Cognitive Status: Impaired/Different from baseline Area of Impairment: Orientation;Memory;Awareness Orientation Level: Disoriented to;Place Current Attention Level: Sustained Memory: Decreased short-term memory General Comments: Pt unaware of pending d/c to [facility] today. Pt states "what am I doing?" Pt educated that daughter will arrive at [facility] and bring clothing .    Mobility  Bed Mobility Bed Mobility: Not assessed Transfers Sit to Stand: 4: Min guard;With upper extremity assist;From chair/3-in-1 Stand to Sit: 4: Min guard;With upper extremity assist;To chair/3-in-1    Exercises      Balance     End of Session OT - End of Session Activity Tolerance: Patient tolerated treatment well Patient left: in chair;with call bell/phone within reach;with nursing/sitter in room (tech in room to take vitals) Nurse Communication: Mobility status  GO     Peri Maris 10/03/2012, 3:16 PM Pager: [phone]

## 2012-10-04 ENCOUNTER — Non-Acute Institutional Stay (SKILLED_NURSING_FACILITY): Payer: PRIVATE HEALTH INSURANCE | Admitting: Adult Health

## 2012-10-04 ENCOUNTER — Encounter: Payer: Self-pay | Admitting: Adult Health

## 2012-10-04 DIAGNOSIS — M6282 Rhabdomyolysis: Secondary | ICD-10-CM

## 2012-10-04 DIAGNOSIS — N183 Chronic kidney disease, stage 3 unspecified: Secondary | ICD-10-CM

## 2012-10-04 DIAGNOSIS — F3289 Other specified depressive episodes: Secondary | ICD-10-CM

## 2012-10-04 DIAGNOSIS — G9340 Encephalopathy, unspecified: Secondary | ICD-10-CM

## 2012-10-04 DIAGNOSIS — F329 Major depressive disorder, single episode, unspecified: Secondary | ICD-10-CM

## 2012-10-04 DIAGNOSIS — E119 Type 2 diabetes mellitus without complications: Secondary | ICD-10-CM

## 2012-10-04 DIAGNOSIS — I10 Essential (primary) hypertension: Secondary | ICD-10-CM

## 2012-10-04 DIAGNOSIS — R569 Unspecified convulsions: Secondary | ICD-10-CM

## 2012-10-04 NOTE — Progress Notes (Signed)
Patient ID: [name], female   DOB: [birth year], 67 y.o.   MRN: [id number] Provider:  Rexene Edison. Mariea Clonts, D.O., C.M.D. Location: [place]    PCP: Default, Provider, MDme while patient is here for STR  Code Status: Full Code   No Known Allergies  Chief Complaint  Patient presents with  . Hospitalization Follow-up    new admission s/p hospitalization for generalized intention tremors  . Medical Managment of Chronic Issues    T2DM, Hyperlipdemia, HTN, seizures,    HPI: 67 y.o. female admitted [facility] s/p hospitalization. Admitted to hospital 9/1 after being found down at home. On admission she was having generalized intention tremors, weak and confused. Found to be in acute renal failure, mild rhabdomyolysis and uncontrolled HTN.  Acute renal failure secondary to rhabdo- resolving, CK normalized  HTN: regimen is clonidine 0.2mg  TID, metoprolol 50 bid, Amlodipine 10mg  daily.  Seizures: tegretol 400 BID, Lacosamide 100 BID- Neurologist ([facility]) managing.    Review of Systems  Constitutional: Negative for fever and chills.  Respiratory: Negative for shortness of breath.   Cardiovascular: Negative for chest pain.  Gastrointestinal: Negative for diarrhea and constipation.  Genitourinary: Positive for urgency and frequency. Negative for dysuria.       Pt. States she has to get up "all night" to urinate due to her medication.  Musculoskeletal: Positive for falls.       Prior to hospitalization  Skin: Negative for rash.  Neurological: Positive for weakness. Negative for dizziness and headaches.  Psychiatric/Behavioral: Negative for depression. The patient has insomnia.        Due to urinary frequency   Past Medical History  Diagnosis Date  . Diabetes mellitus   . Seizures   . Hypertension   . Chronic kidney disease   . Hyperlipidemia    History reviewed. No pertinent past surgical history. Social History:   reports that she has never smoked. She does not have any  smokeless tobacco history on file. She reports that she does not drink alcohol or use illicit drugs.  Family History  Problem Relation Age of Onset  . Hypertension Mother   . Hypertension Father     Medications: Patient's Medications  New Prescriptions   No medications on file  Previous Medications   AMLODIPINE (NORVASC) 10 MG TABLET    Take 10 mg by mouth daily.   CARBAMAZEPINE (TEGRETOL) 200 MG TABLET    Take 400 mg by mouth 2 (two) times daily.   CITALOPRAM (CELEXA) 20 MG TABLET    Take 20 mg by mouth daily.   CLONIDINE (CATAPRES) 0.2 MG TABLET    Take 0.2 mg by mouth 2 (two) times daily.   FUROSEMIDE (LASIX) 80 MG TABLET    Take 1 tablet (80 mg total) by mouth daily.   INSULIN ASPART PROTAMINE- ASPART (NOVOLOG MIX 70/30) (70-30) 100 UNIT/ML INJECTION    Inject 0.1 mLs (10 Units total) into the skin 2 (two) times daily with a meal.   LACOSAMIDE 100 MG TABS    Take 1 tablet (100 mg total) by mouth 2 (two) times daily.   METOPROLOL (LOPRESSOR) 50 MG TABLET    Take 50 mg by mouth daily.   OMEPRAZOLE (PRILOSEC) 40 MG CAPSULE    Take 40 mg by mouth daily.  Modified Medications   No medications on file  Discontinued Medications   No medications on file     Physical Exam: Filed Vitals:   10/04/12 1434  BP: 180/110  Pulse: 60  Temp:  98.2 F (36.8 C)  SpO2: 97%   Physical Exam  Constitutional: She is oriented to person, place, and time. She appears well-developed and well-nourished.  HENT:  Head: Normocephalic and atraumatic.  Right Ear: External ear normal.  Left Ear: External ear normal.  Nose: Nose normal.  Mouth/Throat: Oropharynx is clear and moist. No oropharyngeal exudate.  Eyes: Conjunctivae and EOM are normal. Pupils are equal, round, and reactive to light.  Neck: Normal range of motion. No thyromegaly present.  Cardiovascular: Normal rate, regular rhythm, normal heart sounds and intact distal pulses.   Pulmonary/Chest: Effort normal and breath sounds normal.   Abdominal: Soft. Bowel sounds are normal.  Musculoskeletal: Normal range of motion.  Neurological: She is alert and oriented to person, place, and time.  Skin: Skin is warm and dry.  Psychiatric: She has a normal mood and affect. Her behavior is normal.     Labs reviewed: Basic Metabolic Panel:  Recent Labs  09/26/12 0450 09/28/12 0355 10/02/12 0905  NA 138 142 135  K 3.7 3.8 4.3  CL 99 106 101  CO2 27 28 25   GLUCOSE 184* 189* 173*  BUN 14 17 15   CREATININE 1.43* 1.38* 1.32*  CALCIUM 8.8 8.5 9.0   Liver Function Tests:  Recent Labs  09/25/12 1245 09/26/12 0450  AST 46* 54*  ALT 23 26  ALKPHOS 75 62  BILITOT 0.9 0.8  PROT 7.3 6.8  ALBUMIN 3.2* 2.9*   CBC:  Recent Labs  09/28/12 0355 10/01/12 0552 10/02/12 0430  WBC 3.3* 2.7* 2.6*  NEUTROABS  --  1.0* 1.1*  HGB 10.3* 9.9* 10.0*  HCT 30.2* 28.0* 28.2*  MCV 89.9 87.8 87.6  PLT 155 182 199   Cardiac Enzymes:  Recent Labs  09/25/12 1245  09/25/12 2205 09/26/12 0450 09/26/12 0457 09/26/12 1800 09/28/12 0355  CKTOTAL 1045*  --   --  962*  --  777* 448*  CKMB 16.6*  --   --   --   --   --   --   TROPONINI  --   < > 0.92*  --  0.66*  --  <0.30  < > = values in this interval not displayed. CBG:  Recent Labs  10/03/12 0647 10/03/12 0814 10/03/12 1207  GLUCAP 68* 180* 189*    Imaging and Procedures: 09/25/12 EEG: Normal 09/28/12 MRI Brain: Motion degraded examination. Mild chronic small vessel change. No  sign of acute or subacute insult.   Assessment/Plan 1. HYPERTENSION, BENIGN bp goal < 140/90, monitor here, today's bp was above goal, but just arrived  2. DIABETES MELLITUS, TYPE II Sugars not satisfactory Appetite poor Was on mixed insulin 10 units twice a day Seems she would be more likely to get to goal with lantus 5 units with novolog 5 units meal coverage for cbgs >150--changed to this regimen today CBGs have been 209-234 Check cbgs ac and hs--I will reviewed these again if they  remain elevated CONCHO NAS diet here  3. DEPRESSION, CHRONIC -continue current therapy  4. CKD (chronic kidney disease) stage 3, GFR 30-59 ml/min -has stabilized after rhabdo resolved  5. Rhabdomyolysis -has resolved, was s/p syncopal episode at home  6. Acute encephalopathy -resolved, alert and oriented now  7. SEIZURE DISORDER -managed by [facility] Neurology -monitor   Functional status:generally weak s/p hospitalization--here for PT, OT to return home  Family/ staff Communication: discussed with pt's nurse  Labs/tests ordered: CBC, f/u with pulmonology for scheduled appt.

## 2012-10-09 ENCOUNTER — Encounter: Payer: Self-pay | Admitting: Adult Health

## 2012-10-10 ENCOUNTER — Non-Acute Institutional Stay (SKILLED_NURSING_FACILITY): Payer: PRIVATE HEALTH INSURANCE | Admitting: Internal Medicine

## 2012-10-10 ENCOUNTER — Encounter: Payer: Self-pay | Admitting: Internal Medicine

## 2012-10-10 DIAGNOSIS — I10 Essential (primary) hypertension: Secondary | ICD-10-CM

## 2012-10-10 NOTE — Assessment & Plan Note (Signed)
The nurses report that pt's BP has been high daily; example - 164/77  Pulse 60, 187/77, pulse 60. Cant increase lopressor, pt does have some renal insufficiency. Therefore will increase Clonidine to 0.3 mg BID.

## 2012-10-10 NOTE — Progress Notes (Signed)
MRN: [id number] Name: [name]  Sex: female Age: 67 y.o. DOB: 05-16-1945  [place] #: Helene Kelp Facility/Room: 122A Level Of Care: SNF Provider: Inocencio Homes D Emergency Contacts: Extended Emergency Contact Information Primary Emergency Contact: [name], [place] Phone: [phone] Relation: Daughter   Allergies: Review of patient's allergies indicates no known allergies.  Chief Complaint  Patient presents with  . Acute Visit    HPI: Patient is 67 y.o. female who the nurses report to me has had elevated blood pressures every day. Pt has no c/o.  Past Medical History  Diagnosis Date  . Diabetes mellitus   . Seizures   . Hypertension   . Chronic kidney disease   . Hyperlipidemia     History reviewed. No pertinent past surgical history.    Medication List       This list is accurate as of: 10/10/12  1:37 PM.  Always use your most recent med list.               amLODipine 10 MG tablet  Commonly known as:  NORVASC  Take 10 mg by mouth daily.     carbamazepine 200 MG tablet  Commonly known as:  TEGRETOL  Take 400 mg by mouth 2 (two) times daily.     citalopram 20 MG tablet  Commonly known as:  CELEXA  Take 20 mg by mouth daily.     cloNIDine 0.2 MG tablet  Commonly known as:  CATAPRES  Take 0.2 mg by mouth 2 (two) times daily.     furosemide 80 MG tablet  Commonly known as:  LASIX  Take 1 tablet (80 mg total) by mouth daily.     insulin aspart protamine- aspart (70-30) 100 UNIT/ML injection  Commonly known as:  NOVOLOG MIX 70/30  Inject 0.1 mLs (10 Units total) into the skin 2 (two) times daily with a meal.     Lacosamide 100 MG Tabs  Take 1 tablet (100 mg total) by mouth 2 (two) times daily.     metoprolol 50 MG tablet  Commonly known as:  LOPRESSOR  Take 50 mg by mouth daily.     omeprazole 40 MG capsule  Commonly known as:  PRILOSEC  Take 40 mg by mouth daily.        No orders of  the defined types were placed in this encounter.    Immunization History  Administered Date(s) Administered  . Influenza Whole 02/09/2006  . Pneumococcal Polysaccharide 02/09/2006  . Td 01/25/1997    History  Substance Use Topics  . Smoking status: Never Smoker   . Smokeless tobacco: Never Used  . Alcohol Use: No      Review of Systems - no CP, SOB or orthopnea, no HA's  Filed Vitals:   10/10/12 1316  BP: 162/63  Pulse: 62    Physical Exam  GENERAL APPEARANCE: Alert, conversant. Appropriately groomed. No acute distress.  HEENT- unremarkable RESPIRATORY: Breathing is even, unlabored. Lung sounds are clear   CARDIOVASCULAR: Heart RRR no murmurs, rubs or gallops. + peripheral edema.  GASTROINTESTINAL: Abdomen is soft, non-tender, not distended w/ normal bowel sounds. MUSCULOSKELETAL: No abnormal joints or musculature NEUROLOGIC: Cranial nerves 2-12 grossly intact. Moves all extremities   Patient Active Problem List   Diagnosis Date Noted  . Elevated troponin 09/26/2012  . Acute encephalopathy 09/25/2012  . CKD (chronic kidney disease) stage 3, GFR 30-59 ml/min 09/25/2012  . Tremor 09/25/2012  .  Rhabdomyolysis 09/25/2012  . Morbid obesity 09/16/2006  . DEPRESSION, CHRONIC 09/16/2006  . SEIZURE DISORDER 09/16/2006  . DIABETES MELLITUS, TYPE II 09/09/2006  . HYPERLIPIDEMIA 09/09/2006  . HYPERTENSION, BENIGN 09/09/2006  . CONSTIPATION, HX OF 09/09/2006      CBC    Component Value Date/Time   WBC 2.6* 10/02/2012 0430   RBC 3.22* 10/02/2012 0430   HGB 10.0* 10/02/2012 0430   HCT 28.2* 10/02/2012 0430   PLT 199 10/02/2012 0430   MCV 87.6 10/02/2012 0430   LYMPHSABS 1.1 10/02/2012 0430   MONOABS 0.4 10/02/2012 0430   EOSABS 0.0 10/02/2012 0430   BASOSABS 0.0 10/02/2012 0430    CMP     Component Value Date/Time   NA 135 10/02/2012 0905   K 4.3 10/02/2012 0905   CL 101 10/02/2012 0905   CO2 25 10/02/2012 0905   GLUCOSE 173* 10/02/2012 0905   BUN 15 10/02/2012 0905   CREATININE 1.32*  10/02/2012 0905   CALCIUM 9.0 10/02/2012 0905   PROT 6.8 09/26/2012 0450   ALBUMIN 2.9* 09/26/2012 0450   AST 54* 09/26/2012 0450   ALT 26 09/26/2012 0450   ALKPHOS 62 09/26/2012 0450   BILITOT 0.8 09/26/2012 0450   GFRNONAA 41* 10/02/2012 0905   GFRAA 48* 10/02/2012 0905    Assessment and Plan  HYPERTENSION, BENIGN The nurses report that pt's BP has been high daily; example - 164/77  Pulse 60, 187/77, pulse 60. Cant increase lopressor, pt does have some renal insufficiency. Therefore will increase Clonidine to 0.3 mg BID.    Hennie Duos, MD

## 2012-10-17 ENCOUNTER — Ambulatory Visit (INDEPENDENT_AMBULATORY_CARE_PROVIDER_SITE_OTHER): Payer: PRIVATE HEALTH INSURANCE | Admitting: Pulmonary Disease

## 2012-10-17 ENCOUNTER — Encounter: Payer: Self-pay | Admitting: Pulmonary Disease

## 2012-10-17 VITALS — BP 158/80 | HR 65 | Temp 97.5°F

## 2012-10-17 DIAGNOSIS — G4733 Obstructive sleep apnea (adult) (pediatric): Secondary | ICD-10-CM | POA: Insufficient documentation

## 2012-10-17 HISTORY — DX: Obstructive sleep apnea (adult) (pediatric): G47.33

## 2012-10-17 NOTE — Assessment & Plan Note (Signed)
The patient's history is very suspicious for clinically significant sleep apnea.  I have reviewed the pathophysiology with her, including its impact to her cardiovascular health and quality of life.  I think she needs to have a sleep study for diagnosis, and the patient is agreeable.  I've also encouraged her to work aggressively on weight loss.

## 2012-10-17 NOTE — Patient Instructions (Addendum)
Will schedule for a sleep study to see if you have sleep apnea.  Will call you with results. Work on getting your weight down.

## 2012-10-17 NOTE — Progress Notes (Signed)
  Subjective:    Patient ID: [name], female    DOB: 09/24/1945, 67 y.o.   MRN: [id number]  HPI The patient is a 67 year old female who I've been asked to see for possible obstructive sleep apnea.  She has been noted to have loud snoring, and tells me that her family has commented on abnormal breathing pattern during sleep.  She is not rested in the mornings upon arising, and notes significant daytime sleepiness to the point that she will fall asleep quite a bit if quiet.  She will also fall asleep easily with television in the evening.  Patient states that her weight is up greater than 20 pounds over the last 2 years, and her Epworth sleepiness score today is 16.   Sleep Questionnaire What time do you typically go to bed?( Between what hours) 9-10p 9-10p at 0943 on 10/17/12 by Virl Cagey, CMA How long does it take you to fall asleep? 108min 44min at 0943 on 10/17/12 by Virl Cagey, CMA How many times during the night do you wake up? 2 2 at 0943 on 10/17/12 by Virl Cagey, CMA What time do you get out of bed to start your day? No Value 730-8am at 0943 on 10/17/12 by Virl Cagey, CMA Do you drive or operate heavy machinery in your occupation? No No at 0943 on 10/17/12 by Virl Cagey, CMA How much has your weight changed (up or down) over the past two years? (In pounds) 20 lb (9.072 kg)20 lb (9.072 kg) increase at 0943 on 10/17/12 by Virl Cagey, CMA Have you ever had a sleep study before? No No at 0943 on 10/17/12 by Virl Cagey, CMA Do you currently use CPAP? No No at 0943 on 10/17/12 by Virl Cagey, CMA Do you wear oxygen at any time? No No at 0943 on 10/17/12 by Virl Cagey, CMA   Review of Systems  Constitutional: Negative for fever and unexpected weight change.  HENT: Negative for ear pain, nosebleeds, congestion, sore throat, rhinorrhea, sneezing, trouble swallowing, dental problem, postnasal drip and sinus pressure.   Eyes: Negative for  redness and itching.  Respiratory: Positive for shortness of breath. Negative for cough, chest tightness and wheezing.   Cardiovascular: Positive for leg swelling. Negative for palpitations.  Gastrointestinal: Negative for nausea and vomiting.  Genitourinary: Negative for dysuria.  Musculoskeletal: Negative for joint swelling.  Skin: Negative for rash.  Neurological: Negative for headaches.  Hematological: Does not bruise/bleed easily.  Psychiatric/Behavioral: Positive for dysphoric mood. The patient is nervous/anxious.        Objective:   Physical Exam Constitutional:  Well developed, no acute distress  HENT:  Nares patent without discharge  Oropharynx without exudate, palate and uvula are normal  Eyes:  Perrla, eomi, no scleral icterus  Neck:  No JVD, no TMG  Cardiovascular:  Normal rate, regular rhythm, no rubs or gallops.  No murmurs        Intact distal pulses  Pulmonary :  Normal breath sounds, no stridor or respiratory distress   No rales, rhonchi, or wheezing  Abdominal:  Soft, nondistended, bowel sounds present.  No tenderness noted.   Musculoskeletal:  No lower extremity edema noted.  Lymph Nodes:  No cervical lymphadenopathy noted  Skin:  No cyanosis noted  Neurologic:  Alert, appropriate, moves all 4 extremities without obvious deficit.         Assessment & Plan:

## 2012-10-31 ENCOUNTER — Encounter: Payer: Self-pay | Admitting: Internal Medicine

## 2012-10-31 ENCOUNTER — Non-Acute Institutional Stay (SKILLED_NURSING_FACILITY): Payer: PRIVATE HEALTH INSURANCE | Admitting: Internal Medicine

## 2012-10-31 DIAGNOSIS — F329 Major depressive disorder, single episode, unspecified: Secondary | ICD-10-CM

## 2012-10-31 DIAGNOSIS — E785 Hyperlipidemia, unspecified: Secondary | ICD-10-CM

## 2012-10-31 DIAGNOSIS — R569 Unspecified convulsions: Secondary | ICD-10-CM

## 2012-10-31 DIAGNOSIS — G4733 Obstructive sleep apnea (adult) (pediatric): Secondary | ICD-10-CM

## 2012-10-31 DIAGNOSIS — F3289 Other specified depressive episodes: Secondary | ICD-10-CM

## 2012-10-31 DIAGNOSIS — I10 Essential (primary) hypertension: Secondary | ICD-10-CM

## 2012-10-31 DIAGNOSIS — E119 Type 2 diabetes mellitus without complications: Secondary | ICD-10-CM

## 2012-10-31 DIAGNOSIS — N183 Chronic kidney disease, stage 3 unspecified: Secondary | ICD-10-CM

## 2012-10-31 NOTE — Progress Notes (Signed)
MRN: [id number] Name: [name]  Sex: female Age: 67 y.o. DOB: [birth year]  [place] #: Karren Burly Facility/Room: Level Of Care: SNF Provider: Inocencio Homes D Emergency Contacts: Extended Emergency Contact Information Primary Emergency Contact: [name], [place] Phone: [phone] Relation: Daughter  Allergies: Review of patient's allergies indicates no known allergies.  Chief Complaint  Patient presents with  . Discharge Note    HPI: Patient is 67 y.o. female who is being discharged to home today.  Past Medical History  Diagnosis Date  . Diabetes mellitus   . Seizures   . Hypertension   . Chronic kidney disease   . Hyperlipidemia     No past surgical history on file.    Medication List       This list is accurate as of: 10/31/12 12:16 PM.  Always use your most recent med list.               amLODipine 10 MG tablet  Commonly known as:  NORVASC  Take 10 mg by mouth daily.     carbamazepine 200 MG tablet  Commonly known as:  TEGRETOL  Take 400 mg by mouth 2 (two) times daily.     citalopram 20 MG tablet  Commonly known as:  CELEXA  Take 20 mg by mouth daily.     cloNIDine 0.2 MG tablet  Commonly known as:  CATAPRES  Take 0.2 mg by mouth 2 (two) times daily.     furosemide 80 MG tablet  Commonly known as:  LASIX  Take 40 mg by mouth daily.     insulin aspart 100 UNIT/ML injection  Commonly known as:  novoLOG  Inject 5 Units into the skin 3 (three) times daily with meals.     insulin glargine 100 UNIT/ML injection  Commonly known as:  LANTUS  Inject 5 Units into the skin at bedtime.     Lacosamide 100 MG Tabs  Take 1 tablet (100 mg total) by mouth 2 (two) times daily.     metoprolol 50 MG tablet  Commonly known as:  LOPRESSOR  Take 50 mg by mouth 2 (two) times daily.     omeprazole 40 MG capsule  Commonly known as:  PRILOSEC  Take 40 mg by mouth daily.     polymixin-bacitracin 500-10000  UNIT/GM Oint ointment  Apply 1 application topically daily.        Meds ordered this encounter  Medications  . polymixin-bacitracin (POLYSPORIN) 500-10000 UNIT/GM OINT ointment    Sig: Apply 1 application topically daily.  . insulin glargine (LANTUS) 100 UNIT/ML injection    Sig: Inject 5 Units into the skin at bedtime.  . insulin aspart (NOVOLOG) 100 UNIT/ML injection    Sig: Inject 5 Units into the skin 3 (three) times daily with meals.    Immunization History  Administered Date(s) Administered  . Influenza Whole 02/09/2006  . Pneumococcal Polysaccharide 02/09/2006  . Td 01/25/1997    History  Substance Use Topics  . Smoking status: Never Smoker   . Smokeless tobacco: Never Used     Comment: EXPOSED TO 2ND HAND SMOKE X 20+ YEARS.   [name] Alcohol Use: No    Filed Vitals:   10/31/12 1128  BP: 180/62  Pulse: 61  Temp: 98 F (36.7 C)  Resp: 18    Physical Exam  GENERAL APPEARANCE: Alert, conversant. Appropriately groomed. No acute distress.  HEENT: Unremarkable. RESPIRATORY: Breathing is even, unlabored.  Lung sounds are clear   CARDIOVASCULAR: Heart RRR no murmurs, rubs or gallops.trace peripheral edema; pt is wearing compression stockings GASTROINTESTINAL: Abdomen is soft, non-tender, not distended w/ normal bowel sounds.  NEUROLOGIC: Cranial nerves 2-12 grossly intact. Moves all extremities no tremor.  Patient Active Problem List   Diagnosis Date Noted  . OSA (obstructive sleep apnea) 10/17/2012  . Elevated troponin 09/26/2012  . Acute encephalopathy 09/25/2012  . CKD (chronic kidney disease) stage 3, GFR 30-59 ml/min 09/25/2012  . Tremor 09/25/2012  . Rhabdomyolysis 09/25/2012  . Morbid obesity 09/16/2006  . DEPRESSION, CHRONIC 09/16/2006  . SEIZURE DISORDER 09/16/2006  . DIABETES MELLITUS, TYPE II 09/09/2006  . HYPERLIPIDEMIA 09/09/2006  . HYPERTENSION, BENIGN 09/09/2006  . CONSTIPATION, HX OF 09/09/2006    CBC    Component Value Date/Time   WBC 2.6*  10/02/2012 0430   RBC 3.22* 10/02/2012 0430   HGB 10.0* 10/02/2012 0430   HCT 28.2* 10/02/2012 0430   PLT 199 10/02/2012 0430   MCV 87.6 10/02/2012 0430   LYMPHSABS 1.1 10/02/2012 0430   MONOABS 0.4 10/02/2012 0430   EOSABS 0.0 10/02/2012 0430   BASOSABS 0.0 10/02/2012 0430    CMP     Component Value Date/Time   NA 135 10/02/2012 0905   K 4.3 10/02/2012 0905   CL 101 10/02/2012 0905   CO2 25 10/02/2012 0905   GLUCOSE 173* 10/02/2012 0905   BUN 15 10/02/2012 0905   CREATININE 1.32* 10/02/2012 0905   CALCIUM 9.0 10/02/2012 0905   PROT 6.8 09/26/2012 0450   ALBUMIN 2.9* 09/26/2012 0450   AST 54* 09/26/2012 0450   ALT 26 09/26/2012 0450   ALKPHOS 62 09/26/2012 0450   BILITOT 0.8 09/26/2012 0450   GFRNONAA 41* 10/02/2012 0905   GFRAA 48* 10/02/2012 0905    Assessment and Plan  PT IS BEING D/C TO HOME TODAY WITH ALL SUPPORTING AGENCIES AND DME NECESSARY. PT'S PROBLEMS HAVE BEEN STABLE ON CURRENT MEDICATIONS WITH THE EXCEPTION OF HER BP TODAY WHICH IS ELEVATED AND WHICH HER PCP WILL NEED TO ADDRESS IF IT CONTINUES.   Hennie Duos, MD

## 2012-11-07 DIAGNOSIS — E669 Obesity, unspecified: Secondary | ICD-10-CM

## 2012-11-07 DIAGNOSIS — E119 Type 2 diabetes mellitus without complications: Secondary | ICD-10-CM

## 2012-11-07 DIAGNOSIS — M6282 Rhabdomyolysis: Secondary | ICD-10-CM

## 2012-11-07 DIAGNOSIS — G9340 Encephalopathy, unspecified: Secondary | ICD-10-CM

## 2012-11-07 DIAGNOSIS — M6281 Muscle weakness (generalized): Secondary | ICD-10-CM

## 2012-11-07 DIAGNOSIS — R269 Unspecified abnormalities of gait and mobility: Secondary | ICD-10-CM

## 2012-11-14 ENCOUNTER — Ambulatory Visit (HOSPITAL_BASED_OUTPATIENT_CLINIC_OR_DEPARTMENT_OTHER): Payer: PRIVATE HEALTH INSURANCE | Attending: Pulmonary Disease | Admitting: Radiology

## 2012-11-14 VITALS — Ht 62.5 in | Wt 230.0 lb

## 2012-11-14 DIAGNOSIS — G4733 Obstructive sleep apnea (adult) (pediatric): Secondary | ICD-10-CM

## 2012-11-14 DIAGNOSIS — G4710 Hypersomnia, unspecified: Secondary | ICD-10-CM | POA: Diagnosis present

## 2012-11-29 ENCOUNTER — Other Ambulatory Visit: Payer: Self-pay | Admitting: Internal Medicine

## 2012-12-02 DIAGNOSIS — G4733 Obstructive sleep apnea (adult) (pediatric): Secondary | ICD-10-CM

## 2012-12-03 NOTE — Procedures (Signed)
[name], [name]                ACCOUNT NO.:  [id number]  MEDICAL RECORD NO.:  [id number]          PATIENT TYPE:  OUT  LOCATION:  SLEEP CENTER                 FACILITY:  [facility]  PHYSICIAN:  Kathee Delton, MD,FCCPDATE OF BIRTH:  05/07/47  DATE OF STUDY:  11/14/2012                           NOCTURNAL POLYSOMNOGRAM  REFERRING PHYSICIAN:  Kathee Delton, MD,FCCP  INDICATION FOR STUDY:  Hypersomnia with sleep apnea.  EPWORTH SLEEPINESS SCORE:  5.  MEDICATIONS:  SLEEP ARCHITECTURE:  The patient had total sleep time of 350 minutes with no slow-wave sleep and only 18 minutes of REM.  Sleep onset latency was normal at 14 minutes and REM onset was very prolonged at 333 minutes.  Sleep efficiency was adequate at 96%.  RESPIRATORY DATA:  The patient was found to have 2 apneas and 15 obstructive hypopneas, giving her an apnea-hypopnea index of only 3 events per hour.  The events occurred in all body positions, and there was loud snoring noted throughout.  OXYGEN DATA:  There was O2 desaturation as low as 88% with the patient's obstructive events.  CARDIAC DATA:  No clinically significant arrhythmias were seen.  MOVEMENT-PARASOMNIA:  The patient had no significant leg jerks or other abnormal behaviors noted.  IMPRESSIONS-RECOMMENDATIONS: 1. Small numbers of obstructive events which do not meet the AHI     criteria for the obstructive sleep apnea syndrome.  The patient was     noted to have loud snoring, and would benefit from aggressive     weight loss. 2. Oxygen desaturation as low as 88% during the night with the     patient's obstructive events, however, the desaturations were quite     transient and would not recommend supplemental nocturnal oxygen.     Kathee Delton, MD,FCCP Diplomate, [place] Board of Sleep Medicine   [facility]/MEDQ  D:  12/02/2012 13:43:23  T:  12/03/2012 03:20:55  Job:  [id number]

## 2012-12-05 ENCOUNTER — Telehealth: Payer: Self-pay | Admitting: Pulmonary Disease

## 2012-12-05 NOTE — Telephone Encounter (Signed)
Discussed results with pt.  She does not have clinically significant sleep apnea, but does have loud snoring.  I have asked her to work on weight loss, and to avoid supine position while sleeping.

## 2012-12-07 ENCOUNTER — Other Ambulatory Visit: Payer: Self-pay | Admitting: Internal Medicine

## 2013-02-01 ENCOUNTER — Ambulatory Visit: Payer: PRIVATE HEALTH INSURANCE | Admitting: Podiatrist

## 2013-02-02 ENCOUNTER — Other Ambulatory Visit: Payer: Self-pay | Admitting: Internal Medicine

## 2013-02-28 ENCOUNTER — Encounter: Payer: Self-pay | Admitting: Podiatrist

## 2013-02-28 ENCOUNTER — Ambulatory Visit (INDEPENDENT_AMBULATORY_CARE_PROVIDER_SITE_OTHER): Payer: PRIVATE HEALTH INSURANCE | Admitting: Podiatrist

## 2013-02-28 VITALS — BP 185/69 | HR 55 | Resp 16

## 2013-02-28 DIAGNOSIS — M79609 Pain in unspecified limb: Secondary | ICD-10-CM

## 2013-02-28 DIAGNOSIS — B351 Tinea unguium: Secondary | ICD-10-CM

## 2013-02-28 MED ORDER — UREA 39 % EX CREA: 1.0000 "application " | TOPICAL_CREAM | Freq: Two times a day (BID) | CUTANEOUS | Status: DC

## 2013-02-28 NOTE — Progress Notes (Signed)
  HPI: Patient presents today for follow up of diabetic foot and nail care. Past medical history, meds, and allergies reviewed. She has thick and leathery skin on bilateral feet and wants to know what can be done for these.  Objective:  Neurovascular status unchanged with palpable pedal pulses at 2/4 DP and PT bilateral. and neurological sensation intact. Patients nails are elongated, thickened, discolored, dystrophic with ingrown deformity present.  1 through 5 bilateral   Assessment: Diabetes with no complication , Ingrown nail deformity, hyperkeratotic skin  Plan: Discussed treatment options and alternatives. Debrided nails without complication. Recommended a urea cream for the skin and called in [place]  to her pharmacy..  Return appointment recommended at routine intervals of 3 months.   Trudie Buckler, DPM

## 2013-02-28 NOTE — Patient Instructions (Signed)
Diabetes and Foot Care Diabetes may cause you to have problems because of poor blood supply (circulation) to your feet and legs. This may cause the skin on your feet to become thinner, break easier, and heal more slowly. Your skin may become dry, and the skin may peel and crack. You may also have nerve damage in your legs and feet causing decreased feeling in them. You may not notice minor injuries to your feet that could lead to infections or more serious problems. Taking care of your feet is one of the most important things you can do for yourself.  HOME CARE INSTRUCTIONS  Wear shoes at all times, even in the house. Do not go barefoot. Bare feet are easily injured.  Check your feet daily for blisters, cuts, and redness. If you cannot see the bottom of your feet, use a mirror or ask someone for help.  Wash your feet with warm water (do not use hot water) and mild soap. Then pat your feet and the areas between your toes until they are completely dry. Do not soak your feet as this can dry your skin.  Apply a moisturizing lotion or petroleum jelly (that does not contain alcohol and is unscented) to the skin on your feet and to dry, brittle toenails. Do not apply lotion between your toes.  Trim your toenails straight across. Do not dig under them or around the cuticle. File the edges of your nails with an emery board or nail file.  Do not cut corns or calluses or try to remove them with medicine.  Wear clean socks or stockings every day. Make sure they are not too tight. Do not wear knee-high stockings since they may decrease blood flow to your legs.  Wear shoes that fit properly and have enough cushioning. To break in new shoes, wear them for just a few hours a day. This prevents you from injuring your feet. Always look in your shoes before you put them on to be sure there are no objects inside.  Do not cross your legs. This may decrease the blood flow to your feet.  If you find a minor scrape,  cut, or break in the skin on your feet, keep it and the skin around it clean and dry. These areas may be cleansed with mild soap and water. Do not cleanse the area with peroxide, alcohol, or iodine.  When you remove an adhesive bandage, be sure not to damage the skin around it.  If you have a wound, look at it several times a day to make sure it is healing.  Do not use heating pads or hot water bottles. They may burn your skin. If you have lost feeling in your feet or legs, you may not know it is happening until it is too late.  Make sure your health care provider performs a complete foot exam at least annually or more often if you have foot problems. Report any cuts, sores, or bruises to your health care provider immediately. SEEK MEDICAL CARE IF:   You have an injury that is not healing.  You have cuts or breaks in the skin.  You have an ingrown nail.  You notice redness on your legs or feet.  You feel burning or tingling in your legs or feet.  You have pain or cramps in your legs and feet.  Your legs or feet are numb.  Your feet always feel cold. SEEK IMMEDIATE MEDICAL CARE IF:   There is increasing redness,   swelling, or pain in or around a wound.  There is a red line that goes up your leg.  Pus is coming from a wound.  You develop a fever or as directed by your health care provider.  You notice a bad smell coming from an ulcer or wound. Document Released: 01/09/2000 Document Revised: 09/13/2012 Document Reviewed: 06/20/2012 ExitCare Patient Information 2014 [employer].  

## 2013-03-01 ENCOUNTER — Telehealth: Payer: Self-pay | Admitting: *Deleted

## 2013-03-01 NOTE — Telephone Encounter (Signed)
Message copied by Andres Ege on Thu Mar 01, 2013 11:07 AM ------      Message from: Bronson Ing      Created: Wed Feb 28, 2013  5:38 PM      Regarding: urea cream       I called in aluvea for this patient which was covered under [employer] plan-- if she calls and says it isn't covered, tell her to buy over the counter 20% urea cream in the foot care isle.. There are no other topical medications that her insurance will cover ------

## 2013-04-03 ENCOUNTER — Other Ambulatory Visit: Payer: Self-pay

## 2013-04-03 DIAGNOSIS — Z1231 Encounter for screening mammogram for malignant neoplasm of breast: Secondary | ICD-10-CM

## 2013-04-05 ENCOUNTER — Other Ambulatory Visit: Payer: Self-pay | Admitting: Internal Medicine

## 2013-04-08 ENCOUNTER — Other Ambulatory Visit: Payer: Self-pay | Admitting: Internal Medicine

## 2013-04-18 ENCOUNTER — Ambulatory Visit: Payer: PRIVATE HEALTH INSURANCE

## 2013-05-31 ENCOUNTER — Ambulatory Visit: Payer: PRIVATE HEALTH INSURANCE | Admitting: Podiatrist

## 2013-06-26 ENCOUNTER — Ambulatory Visit
Admission: RE | Admit: 2013-06-26 | Discharge: 2013-06-26 | Disposition: A | Discharge status: Home / Self Care | Payer: PRIVATE HEALTH INSURANCE | Source: Ambulatory Visit

## 2013-06-26 ENCOUNTER — Encounter (INDEPENDENT_AMBULATORY_CARE_PROVIDER_SITE_OTHER): Payer: Self-pay

## 2013-06-26 DIAGNOSIS — Z1231 Encounter for screening mammogram for malignant neoplasm of breast: Secondary | ICD-10-CM

## 2013-08-09 ENCOUNTER — Ambulatory Visit (INDEPENDENT_AMBULATORY_CARE_PROVIDER_SITE_OTHER): Payer: PRIVATE HEALTH INSURANCE | Admitting: Podiatrist

## 2013-08-09 DIAGNOSIS — M79673 Pain in unspecified foot: Secondary | ICD-10-CM

## 2013-08-09 DIAGNOSIS — M79609 Pain in unspecified limb: Secondary | ICD-10-CM

## 2013-08-09 DIAGNOSIS — B351 Tinea unguium: Secondary | ICD-10-CM

## 2013-08-09 NOTE — Progress Notes (Signed)
HPI: Patient presents today for follow up of diabetic foot and nail care. Past medical history, meds, and allergies reviewed.   Objective: Neurovascular status unchanged with palpable pedal pulses at 2/4 DP and PT bilateral. and neurological sensation intact. Patients nails are elongated, thickened, discolored, dystrophic with ingrown deformity present. 1 through 5 bilateral   Assessment: Diabetes with no complication , Ingrown nail deformity  Plan: Discussed treatment options and alternatives. Debrided nails without complication.Return appointment recommended at routine intervals of 3 months.

## 2013-11-08 ENCOUNTER — Ambulatory Visit: Payer: PRIVATE HEALTH INSURANCE | Admitting: Podiatrist

## 2013-12-07 ENCOUNTER — Ambulatory Visit: Payer: PRIVATE HEALTH INSURANCE | Admitting: Podiatrist

## 2014-01-09 ENCOUNTER — Ambulatory Visit (INDEPENDENT_AMBULATORY_CARE_PROVIDER_SITE_OTHER): Payer: PRIVATE HEALTH INSURANCE | Admitting: Podiatrist

## 2014-01-09 DIAGNOSIS — M79609 Pain in unspecified limb: Principal | ICD-10-CM

## 2014-01-09 DIAGNOSIS — B351 Tinea unguium: Secondary | ICD-10-CM

## 2014-01-09 MED ORDER — DICLOFENAC SODIUM 1 % TD GEL: 2.0000 g | Freq: Four times a day (QID) | TRANSDERMAL | Status: DC

## 2014-01-09 NOTE — Progress Notes (Signed)
   HPI: Patient presents today for follow up of diabetic foot and nail care. Past medical history, meds, and allergies reviewed. Patient relates pain in her Achilles tendon and dry skin on her right foot  Objective: Neurovascular status unchanged with palpable pedal pulses at 2/4 DP and PT bilateral. and neurological sensation intact. Patients nails are elongated, thickened, discolored, dystrophic with ingrown deformity present. 1 through 5 bilateral Kelly tendinitis type pain is noted. Dry skin of the right foot is also noted.  Assessment: Diabetes with Achilles tendinitis , Ingrown nail deformity  Plan: Discussed treatment options and alternatives. Recommended Voltaren gel. Recommended a good cream for the dry skin of the foot.   Debrided nails without complication.Return appointment recommended at routine intervals of 3 months.

## 2014-01-09 NOTE — Patient Instructions (Signed)
Diabetes and Foot Care Diabetes may cause you to have problems because of poor blood supply (circulation) to your feet and legs. This may cause the skin on your feet to become thinner, break easier, and heal more slowly. Your skin may become dry, and the skin may peel and crack. You may also have nerve damage in your legs and feet causing decreased feeling in them. You may not notice minor injuries to your feet that could lead to infections or more serious problems. Taking care of your feet is one of the most important things you can do for yourself.  HOME CARE INSTRUCTIONS  Wear shoes at all times, even in the house. Do not go barefoot. Bare feet are easily injured.  Check your feet daily for blisters, cuts, and redness. If you cannot see the bottom of your feet, use a mirror or ask someone for help.  Wash your feet with warm water (do not use hot water) and mild soap. Then pat your feet and the areas between your toes until they are completely dry. Do not soak your feet as this can dry your skin.  Apply a moisturizing lotion or petroleum jelly (that does not contain alcohol and is unscented) to the skin on your feet and to dry, brittle toenails. Do not apply lotion between your toes.  Trim your toenails straight across. Do not dig under them or around the cuticle. File the edges of your nails with an emery board or nail file.  Do not cut corns or calluses or try to remove them with medicine.  Wear clean socks or stockings every day. Make sure they are not too tight. Do not wear knee-high stockings since they may decrease blood flow to your legs.  Wear shoes that fit properly and have enough cushioning. To break in new shoes, wear them for just a few hours a day. This prevents you from injuring your feet. Always look in your shoes before you put them on to be sure there are no objects inside.  Do not cross your legs. This may decrease the blood flow to your feet.  If you find a minor scrape,  cut, or break in the skin on your feet, keep it and the skin around it clean and dry. These areas may be cleansed with mild soap and water. Do not cleanse the area with peroxide, alcohol, or iodine.  When you remove an adhesive bandage, be sure not to damage the skin around it.  If you have a wound, look at it several times a day to make sure it is healing.  Do not use heating pads or hot water bottles. They may burn your skin. If you have lost feeling in your feet or legs, you may not know it is happening until it is too late.  Make sure your health care provider performs a complete foot exam at least annually or more often if you have foot problems. Report any cuts, sores, or bruises to your health care provider immediately. SEEK MEDICAL CARE IF:   You have an injury that is not healing.  You have cuts or breaks in the skin.  You have an ingrown nail.  You notice redness on your legs or feet.  You feel burning or tingling in your legs or feet.  You have pain or cramps in your legs and feet.  Your legs or feet are numb.  Your feet always feel cold. SEEK IMMEDIATE MEDICAL CARE IF:   There is increasing redness,   swelling, or pain in or around a wound.  There is a red line that goes up your leg.  Pus is coming from a wound.  You develop a fever or as directed by your health care provider.  You notice a bad smell coming from an ulcer or wound. Document Released: 01/09/2000 Document Revised: 09/13/2012 Document Reviewed: 06/20/2012 ExitCare Patient Information 2015 [employer]. This information is not intended to replace advice given to you by your health care provider. Make sure you discuss any questions you have with your health care provider.  

## 2014-02-07 ENCOUNTER — Other Ambulatory Visit: Payer: Self-pay | Admitting: Gastroenterology

## 2014-02-07 DIAGNOSIS — D128 Benign neoplasm of rectum: Secondary | ICD-10-CM | POA: Diagnosis not present

## 2014-02-07 DIAGNOSIS — D126 Benign neoplasm of colon, unspecified: Secondary | ICD-10-CM | POA: Diagnosis not present

## 2014-02-07 DIAGNOSIS — K621 Rectal polyp: Secondary | ICD-10-CM | POA: Diagnosis not present

## 2014-02-07 DIAGNOSIS — Z1211 Encounter for screening for malignant neoplasm of colon: Secondary | ICD-10-CM | POA: Diagnosis not present

## 2014-02-07 DIAGNOSIS — D120 Benign neoplasm of cecum: Secondary | ICD-10-CM | POA: Diagnosis not present

## 2014-02-07 DIAGNOSIS — K648 Other hemorrhoids: Secondary | ICD-10-CM | POA: Diagnosis not present

## 2014-02-07 DIAGNOSIS — D124 Benign neoplasm of descending colon: Secondary | ICD-10-CM | POA: Diagnosis not present

## 2014-02-25 DIAGNOSIS — E1159 Type 2 diabetes mellitus with other circulatory complications: Secondary | ICD-10-CM | POA: Diagnosis not present

## 2014-02-25 DIAGNOSIS — L0390 Cellulitis, unspecified: Secondary | ICD-10-CM | POA: Diagnosis not present

## 2014-02-25 DIAGNOSIS — B379 Candidiasis, unspecified: Secondary | ICD-10-CM | POA: Diagnosis not present

## 2014-02-25 DIAGNOSIS — Z0000 Encounter for general adult medical examination without abnormal findings: Secondary | ICD-10-CM | POA: Diagnosis not present

## 2014-02-25 DIAGNOSIS — Z1389 Encounter for screening for other disorder: Secondary | ICD-10-CM | POA: Diagnosis not present

## 2014-02-25 DIAGNOSIS — J302 Other seasonal allergic rhinitis: Secondary | ICD-10-CM | POA: Diagnosis not present

## 2014-02-25 DIAGNOSIS — Z136 Encounter for screening for cardiovascular disorders: Secondary | ICD-10-CM | POA: Diagnosis not present

## 2014-02-25 DIAGNOSIS — I509 Heart failure, unspecified: Secondary | ICD-10-CM | POA: Diagnosis not present

## 2014-02-25 DIAGNOSIS — Z01118 Encounter for examination of ears and hearing with other abnormal findings: Secondary | ICD-10-CM | POA: Diagnosis not present

## 2014-03-11 DIAGNOSIS — R569 Unspecified convulsions: Secondary | ICD-10-CM | POA: Diagnosis not present

## 2014-03-11 DIAGNOSIS — E1359 Other specified diabetes mellitus with other circulatory complications: Secondary | ICD-10-CM | POA: Diagnosis not present

## 2014-03-12 DIAGNOSIS — R569 Unspecified convulsions: Secondary | ICD-10-CM | POA: Diagnosis not present

## 2014-03-12 DIAGNOSIS — E1359 Other specified diabetes mellitus with other circulatory complications: Secondary | ICD-10-CM | POA: Diagnosis not present

## 2014-03-13 DIAGNOSIS — R569 Unspecified convulsions: Secondary | ICD-10-CM | POA: Diagnosis not present

## 2014-03-13 DIAGNOSIS — E1359 Other specified diabetes mellitus with other circulatory complications: Secondary | ICD-10-CM | POA: Diagnosis not present

## 2014-03-14 DIAGNOSIS — E1359 Other specified diabetes mellitus with other circulatory complications: Secondary | ICD-10-CM | POA: Diagnosis not present

## 2014-03-14 DIAGNOSIS — R569 Unspecified convulsions: Secondary | ICD-10-CM | POA: Diagnosis not present

## 2014-03-15 DIAGNOSIS — E1359 Other specified diabetes mellitus with other circulatory complications: Secondary | ICD-10-CM | POA: Diagnosis not present

## 2014-03-15 DIAGNOSIS — R569 Unspecified convulsions: Secondary | ICD-10-CM | POA: Diagnosis not present

## 2014-03-16 DIAGNOSIS — E1359 Other specified diabetes mellitus with other circulatory complications: Secondary | ICD-10-CM | POA: Diagnosis not present

## 2014-03-16 DIAGNOSIS — R569 Unspecified convulsions: Secondary | ICD-10-CM | POA: Diagnosis not present

## 2014-03-17 DIAGNOSIS — R569 Unspecified convulsions: Secondary | ICD-10-CM | POA: Diagnosis not present

## 2014-03-17 DIAGNOSIS — E1359 Other specified diabetes mellitus with other circulatory complications: Secondary | ICD-10-CM | POA: Diagnosis not present

## 2014-03-18 DIAGNOSIS — R569 Unspecified convulsions: Secondary | ICD-10-CM | POA: Diagnosis not present

## 2014-03-18 DIAGNOSIS — E1359 Other specified diabetes mellitus with other circulatory complications: Secondary | ICD-10-CM | POA: Diagnosis not present

## 2014-03-19 DIAGNOSIS — R569 Unspecified convulsions: Secondary | ICD-10-CM | POA: Diagnosis not present

## 2014-03-19 DIAGNOSIS — E1359 Other specified diabetes mellitus with other circulatory complications: Secondary | ICD-10-CM | POA: Diagnosis not present

## 2014-03-20 DIAGNOSIS — R569 Unspecified convulsions: Secondary | ICD-10-CM | POA: Diagnosis not present

## 2014-03-20 DIAGNOSIS — E1359 Other specified diabetes mellitus with other circulatory complications: Secondary | ICD-10-CM | POA: Diagnosis not present

## 2014-03-21 DIAGNOSIS — E1359 Other specified diabetes mellitus with other circulatory complications: Secondary | ICD-10-CM | POA: Diagnosis not present

## 2014-03-21 DIAGNOSIS — R569 Unspecified convulsions: Secondary | ICD-10-CM | POA: Diagnosis not present

## 2014-03-22 DIAGNOSIS — E1359 Other specified diabetes mellitus with other circulatory complications: Secondary | ICD-10-CM | POA: Diagnosis not present

## 2014-03-22 DIAGNOSIS — R569 Unspecified convulsions: Secondary | ICD-10-CM | POA: Diagnosis not present

## 2014-03-23 DIAGNOSIS — R569 Unspecified convulsions: Secondary | ICD-10-CM | POA: Diagnosis not present

## 2014-03-23 DIAGNOSIS — E1359 Other specified diabetes mellitus with other circulatory complications: Secondary | ICD-10-CM | POA: Diagnosis not present

## 2014-03-24 DIAGNOSIS — R569 Unspecified convulsions: Secondary | ICD-10-CM | POA: Diagnosis not present

## 2014-03-24 DIAGNOSIS — E1359 Other specified diabetes mellitus with other circulatory complications: Secondary | ICD-10-CM | POA: Diagnosis not present

## 2014-03-25 DIAGNOSIS — E1359 Other specified diabetes mellitus with other circulatory complications: Secondary | ICD-10-CM | POA: Diagnosis not present

## 2014-03-25 DIAGNOSIS — R569 Unspecified convulsions: Secondary | ICD-10-CM | POA: Diagnosis not present

## 2014-03-26 DIAGNOSIS — E1359 Other specified diabetes mellitus with other circulatory complications: Secondary | ICD-10-CM | POA: Diagnosis not present

## 2014-03-26 DIAGNOSIS — R569 Unspecified convulsions: Secondary | ICD-10-CM | POA: Diagnosis not present

## 2014-03-27 DIAGNOSIS — R569 Unspecified convulsions: Secondary | ICD-10-CM | POA: Diagnosis not present

## 2014-03-27 DIAGNOSIS — E1359 Other specified diabetes mellitus with other circulatory complications: Secondary | ICD-10-CM | POA: Diagnosis not present

## 2014-03-28 DIAGNOSIS — R569 Unspecified convulsions: Secondary | ICD-10-CM | POA: Diagnosis not present

## 2014-03-28 DIAGNOSIS — E1359 Other specified diabetes mellitus with other circulatory complications: Secondary | ICD-10-CM | POA: Diagnosis not present

## 2014-03-29 DIAGNOSIS — E1359 Other specified diabetes mellitus with other circulatory complications: Secondary | ICD-10-CM | POA: Diagnosis not present

## 2014-03-29 DIAGNOSIS — R569 Unspecified convulsions: Secondary | ICD-10-CM | POA: Diagnosis not present

## 2014-03-30 DIAGNOSIS — E1359 Other specified diabetes mellitus with other circulatory complications: Secondary | ICD-10-CM | POA: Diagnosis not present

## 2014-03-30 DIAGNOSIS — R569 Unspecified convulsions: Secondary | ICD-10-CM | POA: Diagnosis not present

## 2014-03-31 DIAGNOSIS — E1359 Other specified diabetes mellitus with other circulatory complications: Secondary | ICD-10-CM | POA: Diagnosis not present

## 2014-03-31 DIAGNOSIS — R569 Unspecified convulsions: Secondary | ICD-10-CM | POA: Diagnosis not present

## 2014-04-01 DIAGNOSIS — R569 Unspecified convulsions: Secondary | ICD-10-CM | POA: Diagnosis not present

## 2014-04-01 DIAGNOSIS — E1359 Other specified diabetes mellitus with other circulatory complications: Secondary | ICD-10-CM | POA: Diagnosis not present

## 2014-04-02 DIAGNOSIS — R569 Unspecified convulsions: Secondary | ICD-10-CM | POA: Diagnosis not present

## 2014-04-02 DIAGNOSIS — E1359 Other specified diabetes mellitus with other circulatory complications: Secondary | ICD-10-CM | POA: Diagnosis not present

## 2014-04-03 DIAGNOSIS — E1359 Other specified diabetes mellitus with other circulatory complications: Secondary | ICD-10-CM | POA: Diagnosis not present

## 2014-04-03 DIAGNOSIS — R569 Unspecified convulsions: Secondary | ICD-10-CM | POA: Diagnosis not present

## 2014-04-04 DIAGNOSIS — R569 Unspecified convulsions: Secondary | ICD-10-CM | POA: Diagnosis not present

## 2014-04-04 DIAGNOSIS — E1359 Other specified diabetes mellitus with other circulatory complications: Secondary | ICD-10-CM | POA: Diagnosis not present

## 2014-04-05 DIAGNOSIS — R569 Unspecified convulsions: Secondary | ICD-10-CM | POA: Diagnosis not present

## 2014-04-05 DIAGNOSIS — E1359 Other specified diabetes mellitus with other circulatory complications: Secondary | ICD-10-CM | POA: Diagnosis not present

## 2014-04-06 DIAGNOSIS — R569 Unspecified convulsions: Secondary | ICD-10-CM | POA: Diagnosis not present

## 2014-04-06 DIAGNOSIS — E1359 Other specified diabetes mellitus with other circulatory complications: Secondary | ICD-10-CM | POA: Diagnosis not present

## 2014-04-07 DIAGNOSIS — E1359 Other specified diabetes mellitus with other circulatory complications: Secondary | ICD-10-CM | POA: Diagnosis not present

## 2014-04-07 DIAGNOSIS — R569 Unspecified convulsions: Secondary | ICD-10-CM | POA: Diagnosis not present

## 2014-04-08 DIAGNOSIS — E1359 Other specified diabetes mellitus with other circulatory complications: Secondary | ICD-10-CM | POA: Diagnosis not present

## 2014-04-08 DIAGNOSIS — R569 Unspecified convulsions: Secondary | ICD-10-CM | POA: Diagnosis not present

## 2014-04-09 DIAGNOSIS — E1359 Other specified diabetes mellitus with other circulatory complications: Secondary | ICD-10-CM | POA: Diagnosis not present

## 2014-04-09 DIAGNOSIS — R569 Unspecified convulsions: Secondary | ICD-10-CM | POA: Diagnosis not present

## 2014-04-10 DIAGNOSIS — E1359 Other specified diabetes mellitus with other circulatory complications: Secondary | ICD-10-CM | POA: Diagnosis not present

## 2014-04-10 DIAGNOSIS — R569 Unspecified convulsions: Secondary | ICD-10-CM | POA: Diagnosis not present

## 2014-04-11 DIAGNOSIS — R569 Unspecified convulsions: Secondary | ICD-10-CM | POA: Diagnosis not present

## 2014-04-11 DIAGNOSIS — E1359 Other specified diabetes mellitus with other circulatory complications: Secondary | ICD-10-CM | POA: Diagnosis not present

## 2014-04-12 DIAGNOSIS — E1359 Other specified diabetes mellitus with other circulatory complications: Secondary | ICD-10-CM | POA: Diagnosis not present

## 2014-04-12 DIAGNOSIS — R569 Unspecified convulsions: Secondary | ICD-10-CM | POA: Diagnosis not present

## 2014-04-13 DIAGNOSIS — E1359 Other specified diabetes mellitus with other circulatory complications: Secondary | ICD-10-CM | POA: Diagnosis not present

## 2014-04-13 DIAGNOSIS — R569 Unspecified convulsions: Secondary | ICD-10-CM | POA: Diagnosis not present

## 2014-04-14 DIAGNOSIS — R569 Unspecified convulsions: Secondary | ICD-10-CM | POA: Diagnosis not present

## 2014-04-14 DIAGNOSIS — E1359 Other specified diabetes mellitus with other circulatory complications: Secondary | ICD-10-CM | POA: Diagnosis not present

## 2014-04-15 DIAGNOSIS — I10 Essential (primary) hypertension: Secondary | ICD-10-CM | POA: Diagnosis not present

## 2014-04-15 DIAGNOSIS — R569 Unspecified convulsions: Secondary | ICD-10-CM | POA: Diagnosis not present

## 2014-04-15 DIAGNOSIS — I509 Heart failure, unspecified: Secondary | ICD-10-CM | POA: Diagnosis not present

## 2014-04-15 DIAGNOSIS — E1159 Type 2 diabetes mellitus with other circulatory complications: Secondary | ICD-10-CM | POA: Diagnosis not present

## 2014-04-15 DIAGNOSIS — E1359 Other specified diabetes mellitus with other circulatory complications: Secondary | ICD-10-CM | POA: Diagnosis not present

## 2014-04-15 DIAGNOSIS — E785 Hyperlipidemia, unspecified: Secondary | ICD-10-CM | POA: Diagnosis not present

## 2014-04-16 DIAGNOSIS — R569 Unspecified convulsions: Secondary | ICD-10-CM | POA: Diagnosis not present

## 2014-04-16 DIAGNOSIS — E1359 Other specified diabetes mellitus with other circulatory complications: Secondary | ICD-10-CM | POA: Diagnosis not present

## 2014-04-17 DIAGNOSIS — E1359 Other specified diabetes mellitus with other circulatory complications: Secondary | ICD-10-CM | POA: Diagnosis not present

## 2014-04-17 DIAGNOSIS — R569 Unspecified convulsions: Secondary | ICD-10-CM | POA: Diagnosis not present

## 2014-04-18 ENCOUNTER — Ambulatory Visit: Payer: PRIVATE HEALTH INSURANCE | Admitting: Podiatrist

## 2014-04-18 DIAGNOSIS — E1359 Other specified diabetes mellitus with other circulatory complications: Secondary | ICD-10-CM | POA: Diagnosis not present

## 2014-04-18 DIAGNOSIS — R569 Unspecified convulsions: Secondary | ICD-10-CM | POA: Diagnosis not present

## 2014-04-20 DIAGNOSIS — E1359 Other specified diabetes mellitus with other circulatory complications: Secondary | ICD-10-CM | POA: Diagnosis not present

## 2014-04-20 DIAGNOSIS — R569 Unspecified convulsions: Secondary | ICD-10-CM | POA: Diagnosis not present

## 2014-04-21 DIAGNOSIS — R569 Unspecified convulsions: Secondary | ICD-10-CM | POA: Diagnosis not present

## 2014-04-21 DIAGNOSIS — E1359 Other specified diabetes mellitus with other circulatory complications: Secondary | ICD-10-CM | POA: Diagnosis not present

## 2014-04-22 DIAGNOSIS — E1359 Other specified diabetes mellitus with other circulatory complications: Secondary | ICD-10-CM | POA: Diagnosis not present

## 2014-04-22 DIAGNOSIS — R569 Unspecified convulsions: Secondary | ICD-10-CM | POA: Diagnosis not present

## 2014-04-23 DIAGNOSIS — E1359 Other specified diabetes mellitus with other circulatory complications: Secondary | ICD-10-CM | POA: Diagnosis not present

## 2014-04-23 DIAGNOSIS — R569 Unspecified convulsions: Secondary | ICD-10-CM | POA: Diagnosis not present

## 2014-04-24 DIAGNOSIS — R569 Unspecified convulsions: Secondary | ICD-10-CM | POA: Diagnosis not present

## 2014-04-24 DIAGNOSIS — E1359 Other specified diabetes mellitus with other circulatory complications: Secondary | ICD-10-CM | POA: Diagnosis not present

## 2014-04-25 DIAGNOSIS — E1359 Other specified diabetes mellitus with other circulatory complications: Secondary | ICD-10-CM | POA: Diagnosis not present

## 2014-04-25 DIAGNOSIS — R569 Unspecified convulsions: Secondary | ICD-10-CM | POA: Diagnosis not present

## 2014-04-26 DIAGNOSIS — R569 Unspecified convulsions: Secondary | ICD-10-CM | POA: Diagnosis not present

## 2014-04-26 DIAGNOSIS — E1359 Other specified diabetes mellitus with other circulatory complications: Secondary | ICD-10-CM | POA: Diagnosis not present

## 2014-04-27 DIAGNOSIS — R569 Unspecified convulsions: Secondary | ICD-10-CM | POA: Diagnosis not present

## 2014-04-27 DIAGNOSIS — E1359 Other specified diabetes mellitus with other circulatory complications: Secondary | ICD-10-CM | POA: Diagnosis not present

## 2014-04-28 DIAGNOSIS — R569 Unspecified convulsions: Secondary | ICD-10-CM | POA: Diagnosis not present

## 2014-04-28 DIAGNOSIS — E1359 Other specified diabetes mellitus with other circulatory complications: Secondary | ICD-10-CM | POA: Diagnosis not present

## 2014-04-29 DIAGNOSIS — E1359 Other specified diabetes mellitus with other circulatory complications: Secondary | ICD-10-CM | POA: Diagnosis not present

## 2014-04-29 DIAGNOSIS — R569 Unspecified convulsions: Secondary | ICD-10-CM | POA: Diagnosis not present

## 2014-04-30 DIAGNOSIS — R569 Unspecified convulsions: Secondary | ICD-10-CM | POA: Diagnosis not present

## 2014-04-30 DIAGNOSIS — E1359 Other specified diabetes mellitus with other circulatory complications: Secondary | ICD-10-CM | POA: Diagnosis not present

## 2014-05-01 DIAGNOSIS — R569 Unspecified convulsions: Secondary | ICD-10-CM | POA: Diagnosis not present

## 2014-05-01 DIAGNOSIS — E1359 Other specified diabetes mellitus with other circulatory complications: Secondary | ICD-10-CM | POA: Diagnosis not present

## 2014-05-02 DIAGNOSIS — E1359 Other specified diabetes mellitus with other circulatory complications: Secondary | ICD-10-CM | POA: Diagnosis not present

## 2014-05-02 DIAGNOSIS — R569 Unspecified convulsions: Secondary | ICD-10-CM | POA: Diagnosis not present

## 2014-05-03 DIAGNOSIS — R569 Unspecified convulsions: Secondary | ICD-10-CM | POA: Diagnosis not present

## 2014-05-03 DIAGNOSIS — E1359 Other specified diabetes mellitus with other circulatory complications: Secondary | ICD-10-CM | POA: Diagnosis not present

## 2014-05-04 DIAGNOSIS — E1359 Other specified diabetes mellitus with other circulatory complications: Secondary | ICD-10-CM | POA: Diagnosis not present

## 2014-05-04 DIAGNOSIS — R569 Unspecified convulsions: Secondary | ICD-10-CM | POA: Diagnosis not present

## 2014-05-05 DIAGNOSIS — R569 Unspecified convulsions: Secondary | ICD-10-CM | POA: Diagnosis not present

## 2014-05-05 DIAGNOSIS — E1359 Other specified diabetes mellitus with other circulatory complications: Secondary | ICD-10-CM | POA: Diagnosis not present

## 2014-05-06 DIAGNOSIS — E1359 Other specified diabetes mellitus with other circulatory complications: Secondary | ICD-10-CM | POA: Diagnosis not present

## 2014-05-06 DIAGNOSIS — R569 Unspecified convulsions: Secondary | ICD-10-CM | POA: Diagnosis not present

## 2014-05-07 DIAGNOSIS — E1359 Other specified diabetes mellitus with other circulatory complications: Secondary | ICD-10-CM | POA: Diagnosis not present

## 2014-05-07 DIAGNOSIS — R569 Unspecified convulsions: Secondary | ICD-10-CM | POA: Diagnosis not present

## 2014-05-08 DIAGNOSIS — R569 Unspecified convulsions: Secondary | ICD-10-CM | POA: Diagnosis not present

## 2014-05-08 DIAGNOSIS — E1359 Other specified diabetes mellitus with other circulatory complications: Secondary | ICD-10-CM | POA: Diagnosis not present

## 2014-05-09 DIAGNOSIS — E1359 Other specified diabetes mellitus with other circulatory complications: Secondary | ICD-10-CM | POA: Diagnosis not present

## 2014-05-09 DIAGNOSIS — R569 Unspecified convulsions: Secondary | ICD-10-CM | POA: Diagnosis not present

## 2014-05-10 DIAGNOSIS — E1359 Other specified diabetes mellitus with other circulatory complications: Secondary | ICD-10-CM | POA: Diagnosis not present

## 2014-05-10 DIAGNOSIS — R569 Unspecified convulsions: Secondary | ICD-10-CM | POA: Diagnosis not present

## 2014-05-11 DIAGNOSIS — E1359 Other specified diabetes mellitus with other circulatory complications: Secondary | ICD-10-CM | POA: Diagnosis not present

## 2014-05-11 DIAGNOSIS — R569 Unspecified convulsions: Secondary | ICD-10-CM | POA: Diagnosis not present

## 2014-05-12 DIAGNOSIS — E1359 Other specified diabetes mellitus with other circulatory complications: Secondary | ICD-10-CM | POA: Diagnosis not present

## 2014-05-12 DIAGNOSIS — R569 Unspecified convulsions: Secondary | ICD-10-CM | POA: Diagnosis not present

## 2014-05-13 DIAGNOSIS — E1359 Other specified diabetes mellitus with other circulatory complications: Secondary | ICD-10-CM | POA: Diagnosis not present

## 2014-05-13 DIAGNOSIS — R569 Unspecified convulsions: Secondary | ICD-10-CM | POA: Diagnosis not present

## 2014-05-14 DIAGNOSIS — E1359 Other specified diabetes mellitus with other circulatory complications: Secondary | ICD-10-CM | POA: Diagnosis not present

## 2014-05-14 DIAGNOSIS — R569 Unspecified convulsions: Secondary | ICD-10-CM | POA: Diagnosis not present

## 2014-05-15 DIAGNOSIS — R569 Unspecified convulsions: Secondary | ICD-10-CM | POA: Diagnosis not present

## 2014-05-15 DIAGNOSIS — E1359 Other specified diabetes mellitus with other circulatory complications: Secondary | ICD-10-CM | POA: Diagnosis not present

## 2014-05-16 DIAGNOSIS — E1359 Other specified diabetes mellitus with other circulatory complications: Secondary | ICD-10-CM | POA: Diagnosis not present

## 2014-05-16 DIAGNOSIS — R569 Unspecified convulsions: Secondary | ICD-10-CM | POA: Diagnosis not present

## 2014-05-17 DIAGNOSIS — E1359 Other specified diabetes mellitus with other circulatory complications: Secondary | ICD-10-CM | POA: Diagnosis not present

## 2014-05-17 DIAGNOSIS — R569 Unspecified convulsions: Secondary | ICD-10-CM | POA: Diagnosis not present

## 2014-05-18 DIAGNOSIS — E1359 Other specified diabetes mellitus with other circulatory complications: Secondary | ICD-10-CM | POA: Diagnosis not present

## 2014-05-18 DIAGNOSIS — R569 Unspecified convulsions: Secondary | ICD-10-CM | POA: Diagnosis not present

## 2014-05-19 DIAGNOSIS — R569 Unspecified convulsions: Secondary | ICD-10-CM | POA: Diagnosis not present

## 2014-05-19 DIAGNOSIS — E1359 Other specified diabetes mellitus with other circulatory complications: Secondary | ICD-10-CM | POA: Diagnosis not present

## 2014-05-20 DIAGNOSIS — R569 Unspecified convulsions: Secondary | ICD-10-CM | POA: Diagnosis not present

## 2014-05-20 DIAGNOSIS — E1359 Other specified diabetes mellitus with other circulatory complications: Secondary | ICD-10-CM | POA: Diagnosis not present

## 2014-05-21 DIAGNOSIS — R569 Unspecified convulsions: Secondary | ICD-10-CM | POA: Diagnosis not present

## 2014-05-21 DIAGNOSIS — E1359 Other specified diabetes mellitus with other circulatory complications: Secondary | ICD-10-CM | POA: Diagnosis not present

## 2014-05-22 DIAGNOSIS — R569 Unspecified convulsions: Secondary | ICD-10-CM | POA: Diagnosis not present

## 2014-05-22 DIAGNOSIS — E1359 Other specified diabetes mellitus with other circulatory complications: Secondary | ICD-10-CM | POA: Diagnosis not present

## 2014-05-23 DIAGNOSIS — R569 Unspecified convulsions: Secondary | ICD-10-CM | POA: Diagnosis not present

## 2014-05-23 DIAGNOSIS — E1359 Other specified diabetes mellitus with other circulatory complications: Secondary | ICD-10-CM | POA: Diagnosis not present

## 2014-05-24 DIAGNOSIS — R569 Unspecified convulsions: Secondary | ICD-10-CM | POA: Diagnosis not present

## 2014-05-24 DIAGNOSIS — E1359 Other specified diabetes mellitus with other circulatory complications: Secondary | ICD-10-CM | POA: Diagnosis not present

## 2014-05-25 DIAGNOSIS — E1359 Other specified diabetes mellitus with other circulatory complications: Secondary | ICD-10-CM | POA: Diagnosis not present

## 2014-05-25 DIAGNOSIS — R569 Unspecified convulsions: Secondary | ICD-10-CM | POA: Diagnosis not present

## 2014-05-26 DIAGNOSIS — R569 Unspecified convulsions: Secondary | ICD-10-CM | POA: Diagnosis not present

## 2014-05-26 DIAGNOSIS — E1359 Other specified diabetes mellitus with other circulatory complications: Secondary | ICD-10-CM | POA: Diagnosis not present

## 2014-05-27 DIAGNOSIS — R569 Unspecified convulsions: Secondary | ICD-10-CM | POA: Diagnosis not present

## 2014-05-27 DIAGNOSIS — E1359 Other specified diabetes mellitus with other circulatory complications: Secondary | ICD-10-CM | POA: Diagnosis not present

## 2014-05-28 DIAGNOSIS — E1359 Other specified diabetes mellitus with other circulatory complications: Secondary | ICD-10-CM | POA: Diagnosis not present

## 2014-05-28 DIAGNOSIS — R569 Unspecified convulsions: Secondary | ICD-10-CM | POA: Diagnosis not present

## 2014-05-29 DIAGNOSIS — E1359 Other specified diabetes mellitus with other circulatory complications: Secondary | ICD-10-CM | POA: Diagnosis not present

## 2014-05-29 DIAGNOSIS — R569 Unspecified convulsions: Secondary | ICD-10-CM | POA: Diagnosis not present

## 2014-05-30 DIAGNOSIS — E1359 Other specified diabetes mellitus with other circulatory complications: Secondary | ICD-10-CM | POA: Diagnosis not present

## 2014-05-30 DIAGNOSIS — R569 Unspecified convulsions: Secondary | ICD-10-CM | POA: Diagnosis not present

## 2014-05-31 DIAGNOSIS — R569 Unspecified convulsions: Secondary | ICD-10-CM | POA: Diagnosis not present

## 2014-05-31 DIAGNOSIS — E1359 Other specified diabetes mellitus with other circulatory complications: Secondary | ICD-10-CM | POA: Diagnosis not present

## 2014-06-01 DIAGNOSIS — E1359 Other specified diabetes mellitus with other circulatory complications: Secondary | ICD-10-CM | POA: Diagnosis not present

## 2014-06-01 DIAGNOSIS — R569 Unspecified convulsions: Secondary | ICD-10-CM | POA: Diagnosis not present

## 2014-06-02 DIAGNOSIS — E1359 Other specified diabetes mellitus with other circulatory complications: Secondary | ICD-10-CM | POA: Diagnosis not present

## 2014-06-02 DIAGNOSIS — R569 Unspecified convulsions: Secondary | ICD-10-CM | POA: Diagnosis not present

## 2014-06-07 ENCOUNTER — Ambulatory Visit: Payer: Medicaid Other

## 2014-06-10 DIAGNOSIS — E1359 Other specified diabetes mellitus with other circulatory complications: Secondary | ICD-10-CM | POA: Diagnosis not present

## 2014-06-10 DIAGNOSIS — R569 Unspecified convulsions: Secondary | ICD-10-CM | POA: Diagnosis not present

## 2014-06-11 DIAGNOSIS — R569 Unspecified convulsions: Secondary | ICD-10-CM | POA: Diagnosis not present

## 2014-06-11 DIAGNOSIS — E1359 Other specified diabetes mellitus with other circulatory complications: Secondary | ICD-10-CM | POA: Diagnosis not present

## 2014-06-12 DIAGNOSIS — E1359 Other specified diabetes mellitus with other circulatory complications: Secondary | ICD-10-CM | POA: Diagnosis not present

## 2014-06-12 DIAGNOSIS — R569 Unspecified convulsions: Secondary | ICD-10-CM | POA: Diagnosis not present

## 2014-06-13 DIAGNOSIS — E1359 Other specified diabetes mellitus with other circulatory complications: Secondary | ICD-10-CM | POA: Diagnosis not present

## 2014-06-13 DIAGNOSIS — R569 Unspecified convulsions: Secondary | ICD-10-CM | POA: Diagnosis not present

## 2014-06-14 ENCOUNTER — Ambulatory Visit (INDEPENDENT_AMBULATORY_CARE_PROVIDER_SITE_OTHER): Payer: Medicare Other

## 2014-06-14 DIAGNOSIS — M79675 Pain in left toe(s): Secondary | ICD-10-CM

## 2014-06-14 DIAGNOSIS — B351 Tinea unguium: Secondary | ICD-10-CM | POA: Diagnosis not present

## 2014-06-14 DIAGNOSIS — E1359 Other specified diabetes mellitus with other circulatory complications: Secondary | ICD-10-CM | POA: Diagnosis not present

## 2014-06-14 DIAGNOSIS — R569 Unspecified convulsions: Secondary | ICD-10-CM | POA: Diagnosis not present

## 2014-06-14 DIAGNOSIS — M79674 Pain in right toe(s): Secondary | ICD-10-CM

## 2014-06-14 NOTE — Progress Notes (Signed)
Patient ID: [name], female   DOB: 01-18-1946, 69 y.o.   MRN: [id number] Complaint:  Visit Type: Patient returns to my office for continued preventative foot care services. Complaint: Patient states" my nails have grown long and thick and become painful to walk and wear shoes" Patient has been diagnosed with DM with no complications. He presents for preventative foot care services. No changes to ROS  Podiatric Exam: Vascular: dorsalis pedis and posterior tibial pulses are palpable bilateral. Capillary return is immediate. Temperature gradient is WNL. Skin turgor WNL  Sensorium: Normal Semmes Weinstein monofilament test. Normal tactile sensation bilaterally. Nail Exam: Pt has thick disfigured discolored nails with subungual debris noted bilateral entire nail hallux through fifth toenails Ulcer Exam: There is no evidence of ulcer or pre-ulcerative changes or infection. Orthopedic Exam: Muscle tone and strength are WNL. No limitations in general ROM. No crepitus or effusions noted. Foot type and digits show no abnormalities. Bony prominences are unremarkable. Skin: No Porokeratosis. No infection or ulcers. Xerotic skin plantar B/L  Diagnosis:  Tinea unguium, Pain in right toe, pain in left toes  Treatment & Plan Procedures and Treatment: Consent by patient was obtained for treatment procedures. The patient understood the discussion of treatment and procedures well. All questions were answered thoroughly reviewed. Debridement of mycotic and hypertrophic toenails, 1 through 5 bilateral and clearing of subungual debris. No ulceration, no infection noted.  Return Visit-Office Procedure: Patient instructed to return to the office for a follow up visit 3 months for continued evaluation and treatment.

## 2014-06-15 DIAGNOSIS — E1359 Other specified diabetes mellitus with other circulatory complications: Secondary | ICD-10-CM | POA: Diagnosis not present

## 2014-06-15 DIAGNOSIS — R569 Unspecified convulsions: Secondary | ICD-10-CM | POA: Diagnosis not present

## 2014-06-16 DIAGNOSIS — E1359 Other specified diabetes mellitus with other circulatory complications: Secondary | ICD-10-CM | POA: Diagnosis not present

## 2014-06-16 DIAGNOSIS — R569 Unspecified convulsions: Secondary | ICD-10-CM | POA: Diagnosis not present

## 2014-06-17 DIAGNOSIS — E1359 Other specified diabetes mellitus with other circulatory complications: Secondary | ICD-10-CM | POA: Diagnosis not present

## 2014-06-17 DIAGNOSIS — R569 Unspecified convulsions: Secondary | ICD-10-CM | POA: Diagnosis not present

## 2014-06-18 DIAGNOSIS — R569 Unspecified convulsions: Secondary | ICD-10-CM | POA: Diagnosis not present

## 2014-06-18 DIAGNOSIS — E1359 Other specified diabetes mellitus with other circulatory complications: Secondary | ICD-10-CM | POA: Diagnosis not present

## 2014-06-19 DIAGNOSIS — E1359 Other specified diabetes mellitus with other circulatory complications: Secondary | ICD-10-CM | POA: Diagnosis not present

## 2014-06-19 DIAGNOSIS — R569 Unspecified convulsions: Secondary | ICD-10-CM | POA: Diagnosis not present

## 2014-06-20 DIAGNOSIS — E1359 Other specified diabetes mellitus with other circulatory complications: Secondary | ICD-10-CM | POA: Diagnosis not present

## 2014-06-20 DIAGNOSIS — R569 Unspecified convulsions: Secondary | ICD-10-CM | POA: Diagnosis not present

## 2014-06-21 DIAGNOSIS — E1359 Other specified diabetes mellitus with other circulatory complications: Secondary | ICD-10-CM | POA: Diagnosis not present

## 2014-06-21 DIAGNOSIS — R569 Unspecified convulsions: Secondary | ICD-10-CM | POA: Diagnosis not present

## 2014-06-22 DIAGNOSIS — R569 Unspecified convulsions: Secondary | ICD-10-CM | POA: Diagnosis not present

## 2014-06-22 DIAGNOSIS — E1359 Other specified diabetes mellitus with other circulatory complications: Secondary | ICD-10-CM | POA: Diagnosis not present

## 2014-06-23 DIAGNOSIS — E1359 Other specified diabetes mellitus with other circulatory complications: Secondary | ICD-10-CM | POA: Diagnosis not present

## 2014-06-23 DIAGNOSIS — R569 Unspecified convulsions: Secondary | ICD-10-CM | POA: Diagnosis not present

## 2014-06-24 DIAGNOSIS — E1359 Other specified diabetes mellitus with other circulatory complications: Secondary | ICD-10-CM | POA: Diagnosis not present

## 2014-06-24 DIAGNOSIS — R569 Unspecified convulsions: Secondary | ICD-10-CM | POA: Diagnosis not present

## 2014-06-25 DIAGNOSIS — R569 Unspecified convulsions: Secondary | ICD-10-CM | POA: Diagnosis not present

## 2014-06-25 DIAGNOSIS — E1359 Other specified diabetes mellitus with other circulatory complications: Secondary | ICD-10-CM | POA: Diagnosis not present

## 2014-06-26 DIAGNOSIS — R569 Unspecified convulsions: Secondary | ICD-10-CM | POA: Diagnosis not present

## 2014-06-26 DIAGNOSIS — E1359 Other specified diabetes mellitus with other circulatory complications: Secondary | ICD-10-CM | POA: Diagnosis not present

## 2014-06-27 DIAGNOSIS — R569 Unspecified convulsions: Secondary | ICD-10-CM | POA: Diagnosis not present

## 2014-06-27 DIAGNOSIS — E1359 Other specified diabetes mellitus with other circulatory complications: Secondary | ICD-10-CM | POA: Diagnosis not present

## 2014-06-28 DIAGNOSIS — R569 Unspecified convulsions: Secondary | ICD-10-CM | POA: Diagnosis not present

## 2014-06-28 DIAGNOSIS — E1359 Other specified diabetes mellitus with other circulatory complications: Secondary | ICD-10-CM | POA: Diagnosis not present

## 2014-06-29 DIAGNOSIS — R569 Unspecified convulsions: Secondary | ICD-10-CM | POA: Diagnosis not present

## 2014-06-29 DIAGNOSIS — E1359 Other specified diabetes mellitus with other circulatory complications: Secondary | ICD-10-CM | POA: Diagnosis not present

## 2014-06-30 DIAGNOSIS — R569 Unspecified convulsions: Secondary | ICD-10-CM | POA: Diagnosis not present

## 2014-06-30 DIAGNOSIS — E1359 Other specified diabetes mellitus with other circulatory complications: Secondary | ICD-10-CM | POA: Diagnosis not present

## 2014-07-01 DIAGNOSIS — R569 Unspecified convulsions: Secondary | ICD-10-CM | POA: Diagnosis not present

## 2014-07-01 DIAGNOSIS — E1359 Other specified diabetes mellitus with other circulatory complications: Secondary | ICD-10-CM | POA: Diagnosis not present

## 2014-07-02 DIAGNOSIS — E1359 Other specified diabetes mellitus with other circulatory complications: Secondary | ICD-10-CM | POA: Diagnosis not present

## 2014-07-02 DIAGNOSIS — R569 Unspecified convulsions: Secondary | ICD-10-CM | POA: Diagnosis not present

## 2014-07-03 DIAGNOSIS — E1359 Other specified diabetes mellitus with other circulatory complications: Secondary | ICD-10-CM | POA: Diagnosis not present

## 2014-07-03 DIAGNOSIS — R569 Unspecified convulsions: Secondary | ICD-10-CM | POA: Diagnosis not present

## 2014-07-04 DIAGNOSIS — E1359 Other specified diabetes mellitus with other circulatory complications: Secondary | ICD-10-CM | POA: Diagnosis not present

## 2014-07-04 DIAGNOSIS — R569 Unspecified convulsions: Secondary | ICD-10-CM | POA: Diagnosis not present

## 2014-07-05 DIAGNOSIS — E1359 Other specified diabetes mellitus with other circulatory complications: Secondary | ICD-10-CM | POA: Diagnosis not present

## 2014-07-05 DIAGNOSIS — R569 Unspecified convulsions: Secondary | ICD-10-CM | POA: Diagnosis not present

## 2014-07-06 DIAGNOSIS — E1359 Other specified diabetes mellitus with other circulatory complications: Secondary | ICD-10-CM | POA: Diagnosis not present

## 2014-07-06 DIAGNOSIS — R569 Unspecified convulsions: Secondary | ICD-10-CM | POA: Diagnosis not present

## 2014-07-07 DIAGNOSIS — E1359 Other specified diabetes mellitus with other circulatory complications: Secondary | ICD-10-CM | POA: Diagnosis not present

## 2014-07-07 DIAGNOSIS — R569 Unspecified convulsions: Secondary | ICD-10-CM | POA: Diagnosis not present

## 2014-07-08 DIAGNOSIS — E1359 Other specified diabetes mellitus with other circulatory complications: Secondary | ICD-10-CM | POA: Diagnosis not present

## 2014-07-08 DIAGNOSIS — R569 Unspecified convulsions: Secondary | ICD-10-CM | POA: Diagnosis not present

## 2014-07-09 DIAGNOSIS — R569 Unspecified convulsions: Secondary | ICD-10-CM | POA: Diagnosis not present

## 2014-07-09 DIAGNOSIS — E1359 Other specified diabetes mellitus with other circulatory complications: Secondary | ICD-10-CM | POA: Diagnosis not present

## 2014-07-10 DIAGNOSIS — R569 Unspecified convulsions: Secondary | ICD-10-CM | POA: Diagnosis not present

## 2014-07-10 DIAGNOSIS — E1359 Other specified diabetes mellitus with other circulatory complications: Secondary | ICD-10-CM | POA: Diagnosis not present

## 2014-07-11 DIAGNOSIS — E1359 Other specified diabetes mellitus with other circulatory complications: Secondary | ICD-10-CM | POA: Diagnosis not present

## 2014-07-11 DIAGNOSIS — R569 Unspecified convulsions: Secondary | ICD-10-CM | POA: Diagnosis not present

## 2014-07-12 DIAGNOSIS — R569 Unspecified convulsions: Secondary | ICD-10-CM | POA: Diagnosis not present

## 2014-07-12 DIAGNOSIS — E1359 Other specified diabetes mellitus with other circulatory complications: Secondary | ICD-10-CM | POA: Diagnosis not present

## 2014-07-13 DIAGNOSIS — E1359 Other specified diabetes mellitus with other circulatory complications: Secondary | ICD-10-CM | POA: Diagnosis not present

## 2014-07-13 DIAGNOSIS — R569 Unspecified convulsions: Secondary | ICD-10-CM | POA: Diagnosis not present

## 2014-07-14 DIAGNOSIS — R569 Unspecified convulsions: Secondary | ICD-10-CM | POA: Diagnosis not present

## 2014-07-14 DIAGNOSIS — E1359 Other specified diabetes mellitus with other circulatory complications: Secondary | ICD-10-CM | POA: Diagnosis not present

## 2014-07-19 DIAGNOSIS — R569 Unspecified convulsions: Secondary | ICD-10-CM | POA: Diagnosis not present

## 2014-07-19 DIAGNOSIS — I509 Heart failure, unspecified: Secondary | ICD-10-CM | POA: Diagnosis not present

## 2014-07-19 DIAGNOSIS — E1159 Type 2 diabetes mellitus with other circulatory complications: Secondary | ICD-10-CM | POA: Diagnosis not present

## 2014-07-19 DIAGNOSIS — I10 Essential (primary) hypertension: Secondary | ICD-10-CM | POA: Diagnosis not present

## 2014-07-19 DIAGNOSIS — E559 Vitamin D deficiency, unspecified: Secondary | ICD-10-CM | POA: Diagnosis not present

## 2014-07-19 DIAGNOSIS — E785 Hyperlipidemia, unspecified: Secondary | ICD-10-CM | POA: Diagnosis not present

## 2014-07-24 ENCOUNTER — Encounter (HOSPITAL_COMMUNITY): Payer: Self-pay | Admitting: *Deleted

## 2014-07-24 ENCOUNTER — Emergency Department (HOSPITAL_COMMUNITY)
Admission: EM | Admit: 2014-07-24 | Discharge: 2014-07-24 | Disposition: A | Discharge status: Home / Self Care | Payer: Medicare Other | Attending: Emergency Medicine | Admitting: Emergency Medicine

## 2014-07-24 ENCOUNTER — Emergency Department (HOSPITAL_COMMUNITY): Payer: Medicare Other

## 2014-07-24 DIAGNOSIS — R252 Cramp and spasm: Secondary | ICD-10-CM | POA: Diagnosis present

## 2014-07-24 DIAGNOSIS — Z791 Long term (current) use of non-steroidal anti-inflammatories (NSAID): Secondary | ICD-10-CM | POA: Insufficient documentation

## 2014-07-24 DIAGNOSIS — J189 Pneumonia, unspecified organism: Secondary | ICD-10-CM

## 2014-07-24 DIAGNOSIS — E785 Hyperlipidemia, unspecified: Secondary | ICD-10-CM | POA: Insufficient documentation

## 2014-07-24 DIAGNOSIS — R05 Cough: Secondary | ICD-10-CM | POA: Diagnosis not present

## 2014-07-24 DIAGNOSIS — J159 Unspecified bacterial pneumonia: Secondary | ICD-10-CM | POA: Insufficient documentation

## 2014-07-24 DIAGNOSIS — E119 Type 2 diabetes mellitus without complications: Secondary | ICD-10-CM | POA: Insufficient documentation

## 2014-07-24 DIAGNOSIS — R059 Cough, unspecified: Secondary | ICD-10-CM

## 2014-07-24 DIAGNOSIS — Z794 Long term (current) use of insulin: Secondary | ICD-10-CM | POA: Diagnosis not present

## 2014-07-24 DIAGNOSIS — N189 Chronic kidney disease, unspecified: Secondary | ICD-10-CM | POA: Insufficient documentation

## 2014-07-24 DIAGNOSIS — Z79899 Other long term (current) drug therapy: Secondary | ICD-10-CM | POA: Diagnosis not present

## 2014-07-24 DIAGNOSIS — I129 Hypertensive chronic kidney disease with stage 1 through stage 4 chronic kidney disease, or unspecified chronic kidney disease: Secondary | ICD-10-CM | POA: Insufficient documentation

## 2014-07-24 DIAGNOSIS — R030 Elevated blood-pressure reading, without diagnosis of hypertension: Secondary | ICD-10-CM | POA: Diagnosis not present

## 2014-07-24 LAB — CBC WITH DIFFERENTIAL/PLATELET
BASOS ABS: 0 10*3/uL (ref 0.0–0.1)
BASOS PCT: 0 % (ref 0–1)
EOS ABS: 0 10*3/uL (ref 0.0–0.7)
EOS PCT: 0 % (ref 0–5)
HCT: 35.8 % — ABNORMAL LOW (ref 36.0–46.0)
HEMOGLOBIN: 12.4 g/dL (ref 12.0–15.0)
LYMPHS ABS: 1.3 10*3/uL (ref 0.7–4.0)
LYMPHS PCT: 15 % (ref 12–46)
MCH: 30.6 pg (ref 26.0–34.0)
MCHC: 34.6 g/dL (ref 30.0–36.0)
MCV: 88.4 fL (ref 78.0–100.0)
MONOS PCT: 10 % (ref 3–12)
Monocytes Absolute: 0.8 10*3/uL (ref 0.1–1.0)
NEUTROS ABS: 6.5 10*3/uL (ref 1.7–7.7)
NEUTROS PCT: 75 % (ref 43–77)
PLATELETS: 199 10*3/uL (ref 150–400)
RBC: 4.05 MIL/uL (ref 3.87–5.11)
RDW: 12.2 % (ref 11.5–15.5)
WBC: 8.6 10*3/uL (ref 4.0–10.5)

## 2014-07-24 LAB — BASIC METABOLIC PANEL
Anion gap: 10 (ref 5–15)
BUN: 22 mg/dL — AB (ref 6–20)
CHLORIDE: 100 mmol/L — AB (ref 101–111)
CO2: 27 mmol/L (ref 22–32)
Calcium: 8.4 mg/dL — ABNORMAL LOW (ref 8.9–10.3)
Creatinine, Ser: 2.21 mg/dL — ABNORMAL HIGH (ref 0.44–1.00)
GFR calc Af Amer: 25 mL/min — ABNORMAL LOW (ref >60)
GFR calc non Af Amer: 22 mL/min — ABNORMAL LOW (ref >60)
GLUCOSE: 121 mg/dL — AB (ref 65–99)
POTASSIUM: 3.9 mmol/L (ref 3.5–5.1)
Sodium: 137 mmol/L (ref 135–145)

## 2014-07-24 MED ORDER — AZITHROMYCIN 250 MG PO TABS: 250.0000 mg | ORAL_TABLET | Freq: Every day | ORAL | Status: DC

## 2014-07-24 NOTE — ED Provider Notes (Signed)
  Face-to-face evaluation   History: She complains of discomfort in her hands, for several months. Says a cough which is productive of mucus. She denies chest pain or shortness of breath.  Physical exam: Alert, elderly appearing female who is obese. Heart regular in rhythm. No murmur. Lungs clear anteriorly. No increased work of breathing.   Medical screening examination/treatment/procedure(s) were conducted as a shared visit with non-physician practitioner(s) and myself.  I personally evaluated the patient during the encounter  Daleen Bo, MD 07/27/14 [phone]

## 2014-07-24 NOTE — Discharge Instructions (Signed)
Have your Primary Care Physician recheck your kidney function.   Today your Creatine was higher than it was 2 years ago.   Make sure you are drinking plenty of fluids.

## 2014-07-24 NOTE — ED Notes (Signed)
CBG 120 

## 2014-07-24 NOTE — ED Notes (Signed)
Patient transported to X-ray 

## 2014-07-24 NOTE — ED Provider Notes (Signed)
CSN: [id number]     Arrival date & time 07/24/14  1725 History   This chart was scribed for Paula Bible, PA-C working with Daleen Bo, MD by Mercy Moore, ED Scribe. This patient was seen in room TR01C/TR01C and the patient's care was started at 7:45 PM.   Chief Complaint  Patient presents with  . Spasms   The history is provided by the patient. No language interpreter was used.   HPI Comments: [name] is a 69 y.o. female brought in by ambulance, who presents to the Emergency Department complaining of bilateral hand tingling, weakness, involuntary twitching in hands and legs ongoing for 2-3 months. Today patient reports onset of generalized weakness. Patient attributes onset of symptoms to recent start of new medications, which she is unable to identify. Patient brings along with her her prescription meds. Upon review of medications list, it appears that patient has most recently started Gabapentin and Vimpat.  Patient reports that she discontinued use of her seizure medication, months ago due to adverse side effects. After resuming use, 6/24, she reports reoccurrence of the effects. No recent seizures.   Patient also complains of cough with production green sputum.  She denies fever, chills, SOB, or chest pain.      Past Medical History  Diagnosis Date  . Diabetes mellitus   . Seizures   . Hypertension   . Chronic kidney disease   . Hyperlipidemia    History reviewed. No pertinent past surgical history. Family History  Problem Relation Age of Onset  . Hypertension Mother   . Hypertension Father   . Cancer Maternal Aunt    History  Substance Use Topics  . Smoking status: Never Smoker   . Smokeless tobacco: Never Used     Comment: EXPOSED TO 2ND HAND SMOKE X 20+ YEARS.   [name] Alcohol Use: No   OB History    No data available     Review of Systems  Constitutional: Negative for fever and chills.  HENT: Negative for congestion, rhinorrhea and sore throat.   Eyes:  Negative for visual disturbance.  Respiratory: Negative for cough, shortness of breath and wheezing.   Gastrointestinal: Negative for nausea, vomiting, abdominal pain and diarrhea.  Endocrine: Negative for polyuria.  Genitourinary: Negative for dysuria and hematuria.  Musculoskeletal: Negative for back pain and joint swelling.  Skin: Negative for rash.  Allergic/Immunologic: Negative for immunocompromised state.  Neurological: Negative for headaches.  Hematological: Does not bruise/bleed easily.  Psychiatric/Behavioral: Negative for confusion.  All other systems reviewed and are negative.     Allergies  Review of patient's allergies indicates no known allergies.  Home Medications   Prior to Admission medications   Medication Sig Start Date End Date Taking? Authorizing Provider  amLODipine (NORVASC) 10 MG tablet Take 10 mg by mouth daily.    Historical Provider, MD  carbamazepine (TEGRETOL) 200 MG tablet Take 400 mg by mouth 2 (two) times daily.    Historical Provider, MD  Cholecalciferol (VITAMIN D-3) 1000 UNITS CAPS Take by mouth.    Historical Provider, MD  citalopram (CELEXA) 20 MG tablet Take 20 mg by mouth daily.    Historical Provider, MD  clobetasol cream (TEMOVATE) [id number] % Apply 1 application topically 2 (two) times daily.    Historical Provider, MD  cloNIDine (CATAPRES) 0.2 MG tablet Take 0.2 mg by mouth 2 (two) times daily.    Historical Provider, MD  diclofenac sodium (VOLTAREN) 1 % GEL Apply 2 g topically 4 (four) times daily. Rub  into affected area of foot 2 to 4 times daily 01/09/14   Bronson Ing, DPM  ezetimibe-simvastatin (VYTORIN) 10-40 MG per tablet Take 1 tablet by mouth daily.    Historical Provider, MD  furosemide (LASIX) 80 MG tablet Take 40 mg by mouth daily.  10/02/12   Nita Sells, MD  glipiZIDE (GLUCOTROL) 5 MG tablet Take by mouth daily before breakfast.    Historical Provider, MD  insulin aspart (NOVOLOG) 100 UNIT/ML injection Inject 5 Units  into the skin 3 (three) times daily with meals.    Historical Provider, MD  insulin glargine (LANTUS) 100 UNIT/ML injection Inject 5 Units into the skin at bedtime.    Historical Provider, MD  metoprolol (LOPRESSOR) 50 MG tablet Take 50 mg by mouth 2 (two) times daily.     Historical Provider, MD  omeprazole (PRILOSEC) 40 MG capsule Take 40 mg by mouth daily.    Historical Provider, MD  Urea (ALUVEA) 39 % CREA Apply 1 application topically 2 (two) times daily. 02/28/13   Bronson Ing, DPM   Triage Vitals: 166/69 mmHg  Pulse 63  Temp(Src) 99.9 F (37.7 C) (Oral)  Resp 16  Ht 5\' 2"  (1.575 m)  Wt 250 lb 1.6 oz (113.445 kg)  BMI 45.73 kg/m2  SpO2 95% Physical Exam  Constitutional: She is oriented to person, place, and time. She appears well-developed and well-nourished. No distress.  HENT:  Head: Normocephalic and atraumatic.  Mouth/Throat: Oropharynx is clear and moist.  Eyes: EOM are normal. Pupils are equal, round, and reactive to light.  Neck: Neck supple. No tracheal deviation present.  Cardiovascular: Normal rate, regular rhythm and normal heart sounds.   Pulmonary/Chest: Effort normal and breath sounds normal. No respiratory distress. She has no wheezes. She has no rales.  Musculoskeletal: Normal range of motion.  Neurological: She is alert and oriented to person, place, and time. She has normal strength. No cranial nerve deficit or sensory deficit.  Normal muscle strength.   Skin: Skin is warm and dry.  Psychiatric: She has a normal mood and affect. Her behavior is normal.  Nursing note and vitals reviewed.   ED Course  Procedures (including critical care time)  COORDINATION OF CARE: 7:51 PM- Discussed treatment plan with patient at bedside and patient agreed to plan.   8:39 PM- Plans to obtain chest x-ray.   Labs Review Labs Reviewed - No data to display  Imaging Review No results found.   EKG Interpretation None      MDM   Final diagnoses:  None    Patient presents today with spasms of her upper extremities bilaterally that have been present for the past 2-3 months intermittently.  Patient also complaining of a productive cough.  Labs unremarkable aside from a Creatine, which is slightly above baseline.  Last Creatine in the chart was in 2014  Patient tolerating PO liquids.   Patient informed of the result and instructed to follow up with PCP.  CXR showing possible Pneumonia.  No hospitalizations in the past 90 days.  Patient is not hypoxic.  No tachypnea or signs of respiratory distress.  Patient treated with Azithromycin.  Stable for discharge.  Return precautions given.    Paula Bible, PA-C 07/26/14 [id number]  Daleen Bo, MD 07/27/14 [phone]

## 2014-07-24 NOTE — ED Notes (Addendum)
Pt arrived by [facility], reports starting new medication and now has twitching/spasms to hands and feet since yesterday. Pt has stopped taking the medication but just wants eval. No acute distress noted at triage.

## 2014-07-24 NOTE — ED Notes (Signed)
Pt returned from X-ray.  

## 2014-07-24 NOTE — ED Notes (Signed)
PA at bedside.

## 2014-07-25 LAB — CBG MONITORING, ED: GLUCOSE-CAPILLARY: 120 mg/dL — AB (ref 65–99)

## 2014-08-22 ENCOUNTER — Other Ambulatory Visit: Payer: Self-pay

## 2014-08-22 DIAGNOSIS — Z1231 Encounter for screening mammogram for malignant neoplasm of breast: Secondary | ICD-10-CM

## 2014-08-29 ENCOUNTER — Ambulatory Visit: Payer: Medicaid Other

## 2014-09-12 ENCOUNTER — Ambulatory Visit: Payer: Medicaid Other | Admitting: Podiatry

## 2014-09-12 DIAGNOSIS — I509 Heart failure, unspecified: Secondary | ICD-10-CM | POA: Diagnosis not present

## 2014-09-12 DIAGNOSIS — E785 Hyperlipidemia, unspecified: Secondary | ICD-10-CM | POA: Diagnosis not present

## 2014-09-12 DIAGNOSIS — E1159 Type 2 diabetes mellitus with other circulatory complications: Secondary | ICD-10-CM | POA: Diagnosis not present

## 2014-09-12 DIAGNOSIS — I10 Essential (primary) hypertension: Secondary | ICD-10-CM | POA: Diagnosis not present

## 2014-09-12 DIAGNOSIS — R569 Unspecified convulsions: Secondary | ICD-10-CM | POA: Diagnosis not present

## 2014-09-24 ENCOUNTER — Emergency Department (HOSPITAL_COMMUNITY): Payer: Medicare Other

## 2014-09-24 ENCOUNTER — Ambulatory Visit: Payer: Medicaid Other | Admitting: Podiatry

## 2014-09-24 ENCOUNTER — Emergency Department (HOSPITAL_COMMUNITY)
Admission: EM | Admit: 2014-09-24 | Discharge: 2014-09-24 | Disposition: A | Discharge status: Home / Self Care | Payer: Medicare Other | Attending: Emergency Medicine | Admitting: Emergency Medicine

## 2014-09-24 ENCOUNTER — Encounter (HOSPITAL_COMMUNITY): Payer: Self-pay | Admitting: *Deleted

## 2014-09-24 DIAGNOSIS — E119 Type 2 diabetes mellitus without complications: Secondary | ICD-10-CM | POA: Insufficient documentation

## 2014-09-24 DIAGNOSIS — Y9389 Activity, other specified: Secondary | ICD-10-CM | POA: Diagnosis not present

## 2014-09-24 DIAGNOSIS — Z792 Long term (current) use of antibiotics: Secondary | ICD-10-CM | POA: Diagnosis not present

## 2014-09-24 DIAGNOSIS — K219 Gastro-esophageal reflux disease without esophagitis: Secondary | ICD-10-CM | POA: Diagnosis not present

## 2014-09-24 DIAGNOSIS — Z791 Long term (current) use of non-steroidal anti-inflammatories (NSAID): Secondary | ICD-10-CM | POA: Diagnosis not present

## 2014-09-24 DIAGNOSIS — S069X9A Unspecified intracranial injury with loss of consciousness of unspecified duration, initial encounter: Secondary | ICD-10-CM | POA: Diagnosis not present

## 2014-09-24 DIAGNOSIS — Z79899 Other long term (current) drug therapy: Secondary | ICD-10-CM | POA: Diagnosis not present

## 2014-09-24 DIAGNOSIS — G40909 Epilepsy, unspecified, not intractable, without status epilepticus: Secondary | ICD-10-CM | POA: Insufficient documentation

## 2014-09-24 DIAGNOSIS — S8992XA Unspecified injury of left lower leg, initial encounter: Secondary | ICD-10-CM | POA: Insufficient documentation

## 2014-09-24 DIAGNOSIS — I129 Hypertensive chronic kidney disease with stage 1 through stage 4 chronic kidney disease, or unspecified chronic kidney disease: Secondary | ICD-10-CM | POA: Diagnosis not present

## 2014-09-24 DIAGNOSIS — Y998 Other external cause status: Secondary | ICD-10-CM | POA: Diagnosis not present

## 2014-09-24 DIAGNOSIS — N189 Chronic kidney disease, unspecified: Secondary | ICD-10-CM | POA: Diagnosis not present

## 2014-09-24 DIAGNOSIS — Y92000 Kitchen of unspecified non-institutional (private) residence as  the place of occurrence of the external cause: Secondary | ICD-10-CM | POA: Insufficient documentation

## 2014-09-24 DIAGNOSIS — W1839XA Other fall on same level, initial encounter: Secondary | ICD-10-CM | POA: Diagnosis not present

## 2014-09-24 DIAGNOSIS — W19XXXA Unspecified fall, initial encounter: Secondary | ICD-10-CM

## 2014-09-24 DIAGNOSIS — I10 Essential (primary) hypertension: Secondary | ICD-10-CM

## 2014-09-24 DIAGNOSIS — M25562 Pain in left knee: Secondary | ICD-10-CM | POA: Diagnosis not present

## 2014-09-24 DIAGNOSIS — Z794 Long term (current) use of insulin: Secondary | ICD-10-CM | POA: Diagnosis not present

## 2014-09-24 DIAGNOSIS — S299XXA Unspecified injury of thorax, initial encounter: Secondary | ICD-10-CM | POA: Diagnosis not present

## 2014-09-24 DIAGNOSIS — R6889 Other general symptoms and signs: Secondary | ICD-10-CM | POA: Diagnosis not present

## 2014-09-24 HISTORY — DX: Gastro-esophageal reflux disease without esophagitis: K21.9

## 2014-09-24 LAB — CBC WITH DIFFERENTIAL/PLATELET
BASOS ABS: 0 10*3/uL (ref 0.0–0.1)
Basophils Relative: 0 % (ref 0–1)
EOS ABS: 0 10*3/uL (ref 0.0–0.7)
Eosinophils Relative: 0 % (ref 0–5)
HCT: 36.6 % (ref 36.0–46.0)
HEMOGLOBIN: 12.7 g/dL (ref 12.0–15.0)
LYMPHS ABS: 1.2 10*3/uL (ref 0.7–4.0)
Lymphocytes Relative: 24 % (ref 12–46)
MCH: 31.4 pg (ref 26.0–34.0)
MCHC: 34.7 g/dL (ref 30.0–36.0)
MCV: 90.4 fL (ref 78.0–100.0)
Monocytes Absolute: 0.4 10*3/uL (ref 0.1–1.0)
Monocytes Relative: 7 % (ref 3–12)
NEUTROS PCT: 69 % (ref 43–77)
Neutro Abs: 3.4 10*3/uL (ref 1.7–7.7)
PLATELETS: 227 10*3/uL (ref 150–400)
RBC: 4.05 MIL/uL (ref 3.87–5.11)
RDW: 11.7 % (ref 11.5–15.5)
WBC: 5 10*3/uL (ref 4.0–10.5)

## 2014-09-24 LAB — URINE MICROSCOPIC-ADD ON

## 2014-09-24 LAB — BASIC METABOLIC PANEL
Anion gap: 9 (ref 5–15)
BUN: 20 mg/dL (ref 6–20)
CHLORIDE: 104 mmol/L (ref 101–111)
CO2: 26 mmol/L (ref 22–32)
CREATININE: 1.95 mg/dL — AB (ref 0.44–1.00)
Calcium: 8.7 mg/dL — ABNORMAL LOW (ref 8.9–10.3)
GFR, EST AFRICAN AMERICAN: 29 mL/min — AB (ref >60)
GFR, EST NON AFRICAN AMERICAN: 25 mL/min — AB (ref >60)
Glucose, Bld: 139 mg/dL — ABNORMAL HIGH (ref 65–99)
POTASSIUM: 3.9 mmol/L (ref 3.5–5.1)
SODIUM: 139 mmol/L (ref 135–145)

## 2014-09-24 LAB — I-STAT TROPONIN, ED: TROPONIN I, POC: 0.03 ng/mL (ref 0.00–0.08)

## 2014-09-24 LAB — URINALYSIS, ROUTINE W REFLEX MICROSCOPIC
Bilirubin Urine: NEGATIVE
Glucose, UA: 100 mg/dL — AB
KETONES UR: NEGATIVE mg/dL
LEUKOCYTES UA: NEGATIVE
NITRITE: NEGATIVE
Specific Gravity, Urine: 1.013 (ref 1.005–1.030)
UROBILINOGEN UA: 1 mg/dL (ref 0.0–1.0)
pH: 6 (ref 5.0–8.0)

## 2014-09-24 NOTE — Progress Notes (Signed)
Reviewed with ED SW

## 2014-09-24 NOTE — ED Notes (Signed)
Bed: [id number] Expected date:  Expected time:  Means of arrival:  Comments: EMS- fall, HTN

## 2014-09-24 NOTE — ED Notes (Signed)
Pt in from home, aide had come by to check on her, she was awoken from a nap and on the way to the door, fell in the kitchen. Most likely mechanical fall but not sure exactly how she fell, recalls fall, denies LOC or hitting head. Denies pain. BP initially 280/130 with ems after they helped her off of the floor. She was very tremorous and unable to support her own weight just after fall. BP downtrending with ems to 230/100 upon arrival to ED. Has taken BP meds today. Denies other complaints r/t BP. EMS 20g R AC.

## 2014-09-24 NOTE — Progress Notes (Signed)
CSW met with patient at bedside. Granddaughter's was present. Patient confirms that she presents to [facility] due to fall. Patient states that she is not sure what caused her to fall.  According to patient, she does not fall often. She says that this is the first incident within 6 months.   Patient informed CSW that she receives home health services with [facility]. Patient states that they come 7 days a week. Patient states that she is able to dress herself, but they assist her with getting into the shower.  Patient informed  CSW that she is not interested in a facility at this time. Patient informed CSW that she feels safe to return home.  Granddaughters state that they visit patient on the weekends.  Patient and granddaughter's state that they do not have any questions for CSW at this time.  Granddaughter/ Christina [phone]  Willette Brace [phone] ED CSW 09/24/2014 9:23 PM

## 2014-09-24 NOTE — Discharge Instructions (Signed)
Fall Prevention and Home Safety Falls cause injuries and can affect all age groups. It is possible to use preventive measures to significantly decrease the likelihood of falls. There are many simple measures which can make your home safer and prevent falls. OUTDOORS  Repair cracks and edges of walkways and driveways.  Remove high doorway thresholds.  Trim shrubbery on the main path into your home.  Have good outside lighting.  Clear walkways of tools, rocks, debris, and clutter.  Check that handrails are not broken and are securely fastened. Both sides of steps should have handrails.  Have leaves, snow, and ice cleared regularly.  Use sand or salt on walkways during winter months.  In the garage, clean up grease or oil spills. BATHROOM  Install night lights.  Install grab bars by the toilet and in the tub and shower.  Use non-skid mats or decals in the tub or shower.  Place a plastic non-slip stool in the shower to sit on, if needed.  Keep floors dry and clean up all water on the floor immediately.  Remove soap buildup in the tub or shower on a regular basis.  Secure bath mats with non-slip, double-sided rug tape.  Remove throw rugs and tripping hazards from the floors. BEDROOMS  Install night lights.  Make sure a bedside light is easy to reach.  Do not use oversized bedding.  Keep a telephone by your bedside.  Have a firm chair with side arms to use for getting dressed.  Remove throw rugs and tripping hazards from the floor. KITCHEN  Keep handles on pots and pans turned toward the center of the stove. Use back burners when possible.  Clean up spills quickly and allow time for drying.  Avoid walking on wet floors.  Avoid hot utensils and knives.  Position shelves so they are not too high or low.  Place commonly used objects within easy reach.  If necessary, use a sturdy step stool with a grab bar when reaching.  Keep electrical cables out of the  way.  Do not use floor polish or wax that makes floors slippery. If you must use wax, use non-skid floor wax.  Remove throw rugs and tripping hazards from the floor. STAIRWAYS  Never leave objects on stairs.  Place handrails on both sides of stairways and use them. Fix any loose handrails. Make sure handrails on both sides of the stairways are as long as the stairs.  Check carpeting to make sure it is firmly attached along stairs. Make repairs to worn or loose carpet promptly.  Avoid placing throw rugs at the top or bottom of stairways, or properly secure the rug with carpet tape to prevent slippage. Get rid of throw rugs, if possible.  Have an electrician put in a light switch at the top and bottom of the stairs. OTHER FALL PREVENTION TIPS  Wear low-heel or rubber-soled shoes that are supportive and fit well. Wear closed toe shoes.  When using a stepladder, make sure it is fully opened and both spreaders are firmly locked. Do not climb a closed stepladder.  Add color or contrast paint or tape to grab bars and handrails in your home. Place contrasting color strips on first and last steps.  Learn and use mobility aids as needed. Install an electrical emergency response system.  Turn on lights to avoid dark areas. Replace light bulbs that burn out immediately. Get light switches that glow.  Arrange furniture to create clear pathways. Keep furniture in the same place.  Firmly attach carpet with non-skid or double-sided tape.  Eliminate uneven floor surfaces.  Select a carpet pattern that does not visually hide the edge of steps.  Be aware of all pets. OTHER HOME SAFETY TIPS  Set the water temperature for 120 F (48.8 C).  Keep emergency numbers on or near the telephone.  Keep smoke detectors on every level of the home and near sleeping areas. Document Released: 01/01/2002 Document Revised: 07/13/2011 Document Reviewed: 04/02/2011 [facility] Patient Information 2015  [place], [place]. This information is not intended to replace advice given to you by your health care provider. Make sure you discuss any questions you have with your health care provider.  Hypertension Hypertension, commonly called high blood pressure, is when the force of blood pumping through your arteries is too strong. Your arteries are the blood vessels that carry blood from your heart throughout your body. A blood pressure reading consists of a higher number over a lower number, such as 110/72. The higher number (systolic) is the pressure inside your arteries when your heart pumps. The lower number (diastolic) is the pressure inside your arteries when your heart relaxes. Ideally you want your blood pressure below 120/80. Hypertension forces your heart to work harder to pump blood. Your arteries may become narrow or stiff. Having hypertension puts you at risk for heart disease, stroke, and other problems.  RISK FACTORS Some risk factors for high blood pressure are controllable. Others are not.  Risk factors you cannot control include:   Race. You may be at higher risk if you are African American.  Age. Risk increases with age.  Gender. Men are at higher risk than women before age 63 years. After age 58, women are at higher risk than men. Risk factors you can control include:  Not getting enough exercise or physical activity.  Being overweight.  Getting too much fat, sugar, calories, or salt in your diet.  Drinking too much alcohol. SIGNS AND SYMPTOMS Hypertension does not usually cause signs or symptoms. Extremely high blood pressure (hypertensive crisis) may cause headache, anxiety, shortness of breath, and nosebleed. DIAGNOSIS  To check if you have hypertension, your health care provider will measure your blood pressure while you are seated, with your arm held at the level of your heart. It should be measured at least twice using the same arm. Certain conditions can cause a difference  in blood pressure between your right and left arms. A blood pressure reading that is higher than normal on one occasion does not mean that you need treatment. If one blood pressure reading is high, ask your health care provider about having it checked again. TREATMENT  Treating high blood pressure includes making lifestyle changes and possibly taking medicine. Living a healthy lifestyle can help lower high blood pressure. You may need to change some of your habits. Lifestyle changes may include:  Following the DASH diet. This diet is high in fruits, vegetables, and whole grains. It is low in salt, red meat, and added sugars.  Getting at least 2 hours of brisk physical activity every week.  Losing weight if necessary.  Not smoking.  Limiting alcoholic beverages.  Learning ways to reduce stress. If lifestyle changes are not enough to get your blood pressure under control, your health care provider may prescribe medicine. You may need to take more than one. Work closely with your health care provider to understand the risks and benefits. HOME CARE INSTRUCTIONS  Have your blood pressure rechecked as directed by your health care  provider.   Take medicines only as directed by your health care provider. Follow the directions carefully. Blood pressure medicines must be taken as prescribed. The medicine does not work as well when you skip doses. Skipping doses also puts you at risk for problems.   Do not smoke.   Monitor your blood pressure at home as directed by your health care provider. SEEK MEDICAL CARE IF:   You think you are having a reaction to medicines taken.  You have recurrent headaches or feel dizzy.  You have swelling in your ankles.  You have trouble with your vision. SEEK IMMEDIATE MEDICAL CARE IF:  You develop a severe headache or confusion.  You have unusual weakness, numbness, or feel faint.  You have severe chest or abdominal pain.  You vomit  repeatedly.  You have trouble breathing. MAKE SURE YOU:   Understand these instructions.  Will watch your condition.  Will get help right away if you are not doing well or get worse. Document Released: 01/11/2005 Document Revised: 05/28/2013 Document Reviewed: 11/03/2012 [facility] Patient Information 2015 [place], [place]. This information is not intended to replace advice given to you by your health care provider. Make sure you discuss any questions you have with your health care provider.

## 2014-09-24 NOTE — ED Notes (Signed)
Pt returned from X-ray.  

## 2014-09-24 NOTE — ED Provider Notes (Signed)
CSN: [id number]     Arrival date & time 09/24/14  1608 History   First MD Initiated Contact with Patient 09/24/14 1618     Chief Complaint  Patient presents with  . Fall  . Hypertension     The history is provided by the patient. No language interpreter was used.   [name] presents for evaluation of an elevated blood pressure. She states that she fell asleep on the couch watching game shows when her a candidate or to take her to a podiatry appointment. She thinks she got up too quickly and she fell to the ground. She felt like she was going to fall and began to catch herself and landed on her bottom. She reports some pain in her left knee. She had no head injury or loss of consciousness. She presents by EMS because she was too weak to get herself up off the floor. Upon EMS arrival her blood pressure was 280/130. She has a history of severe hypertension, diabetes, chronic kidney disease, seizure disorder. She states that she took all of her medications as prescribed this morning. She was recently started on a few new medications but she is not sure what those medications are.  Past Medical History  Diagnosis Date  . Diabetes mellitus   . Seizures   . Hypertension   . Chronic kidney disease   . Hyperlipidemia   . GERD (gastroesophageal reflux disease)    History reviewed. No pertinent past surgical history. Family History  Problem Relation Age of Onset  . Hypertension Mother   . Hypertension Father   . Cancer Maternal Aunt    Social History  Substance Use Topics  . Smoking status: Never Smoker   . Smokeless tobacco: Never Used     Comment: EXPOSED TO 2ND HAND SMOKE X 20+ YEARS.   [name] Alcohol Use: No   OB History    No data available     Review of Systems  All other systems reviewed and are negative.     Allergies  Review of patient's allergies indicates no known allergies.  Home Medications   Prior to Admission medications   Medication Sig Start Date End Date  Taking? Authorizing Provider  amLODipine (NORVASC) 10 MG tablet Take 10 mg by mouth daily.   Yes Historical Provider, MD  carbamazepine (TEGRETOL) 200 MG tablet Take 400 mg by mouth 2 (two) times daily.   Yes Historical Provider, MD  Cholecalciferol (VITAMIN D-3) 1000 UNITS CAPS Take 1,000 Units by mouth daily at 12 noon.    Yes Historical Provider, MD  citalopram (CELEXA) 20 MG tablet Take 20 mg by mouth daily.   Yes Historical Provider, MD  clobetasol cream (TEMOVATE) [id number] % Apply 1 application topically 2 (two) times daily as needed (for legs).    Yes Historical Provider, MD  ezetimibe-simvastatin (VYTORIN) 10-40 MG per tablet Take 1 tablet by mouth daily.   Yes Historical Provider, MD  furosemide (LASIX) 80 MG tablet Take 40 mg by mouth daily.  10/02/12  Yes Nita Sells, MD  glipiZIDE (GLUCOTROL) 5 MG tablet Take 5 mg by mouth daily before breakfast.    Yes Historical Provider, MD  insulin NPH-regular Human (NOVOLIN 70/30) (70-30) 100 UNIT/ML injection Inject 30 Units into the skin 2 (two) times daily with a meal.   Yes Historical Provider, MD  omeprazole (PRILOSEC) 40 MG capsule Take 40 mg by mouth daily.   Yes Historical Provider, MD  azithromycin (ZITHROMAX) 250 MG tablet Take 1 tablet (250 mg  total) by mouth daily. Take first 2 tablets together, then 1 every day until finished. 07/24/14   Heather Laisure, PA-C  cloNIDine (CATAPRES) 0.2 MG tablet Take 0.2 mg by mouth 2 (two) times daily.    Historical Provider, MD  diclofenac sodium (VOLTAREN) 1 % GEL Apply 2 g topically 4 (four) times daily. Rub into affected area of foot 2 to 4 times daily 01/09/14   Bronson Ing, DPM  insulin aspart (NOVOLOG) 100 UNIT/ML injection Inject 5 Units into the skin 3 (three) times daily with meals.    Historical Provider, MD  insulin glargine (LANTUS) 100 UNIT/ML injection Inject 5 Units into the skin at bedtime.    Historical Provider, MD  metoprolol (LOPRESSOR) 50 MG tablet Take 50 mg by mouth 2 (two)  times daily.     Historical Provider, MD  Urea (ALUVEA) 39 % CREA Apply 1 application topically 2 (two) times daily. 02/28/13   Viona Gilmore Egerton, DPM   BP 229/70 mmHg  Pulse 58  Temp(Src) 98.4 F (36.9 C) (Oral)  Resp 18  SpO2 96% Physical Exam  Constitutional: She is oriented to person, place, and time. She appears well-developed and well-nourished.  HENT:  Head: Normocephalic and atraumatic.  Cardiovascular: Normal rate and regular rhythm.   No murmur heard. Pulmonary/Chest: Effort normal and breath sounds normal. No respiratory distress.  Abdominal: Soft. There is no tenderness. There is no rebound and no guarding.  Musculoskeletal:  1+ pitting edema in bilateral lower extremities. Mild tenderness to palpation over the left anterior knee.  Neurological: She is alert and oriented to person, place, and time.  Skin: Skin is warm and dry.  Psychiatric: She has a normal mood and affect. Her behavior is normal.  Nursing note and vitals reviewed.   ED Course  Procedures (including critical care time) Labs Review Labs Reviewed  BASIC METABOLIC PANEL - Abnormal; Notable for the following:    Glucose, Bld 139 (*)    Creatinine, Ser 1.95 (*)    Calcium 8.7 (*)    GFR calc non Af Amer 25 (*)    GFR calc Af Amer 29 (*)    All other components within normal limits  URINALYSIS, ROUTINE W REFLEX MICROSCOPIC (NOT AT [facility]) - Abnormal; Notable for the following:    Glucose, UA 100 (*)    Hgb urine dipstick MODERATE (*)    Protein, ur >300 (*)    All other components within normal limits  URINE MICROSCOPIC-ADD ON - Abnormal; Notable for the following:    Squamous Epithelial / LPF FEW (*)    All other components within normal limits  CBC WITH DIFFERENTIAL/PLATELET  Randolm Idol, ED    Imaging Review Dg Chest 2 View  09/24/2014   CLINICAL DATA:  Fall, possible syncope, hypertension  EXAM: CHEST  2 VIEW  COMPARISON:  07/24/2014  FINDINGS: Heart size is mildly enlarged with central  venous congestion. Previously seen right lower lobe airspace opacity has resolved. Lungs are hypoaerated with crowding of the bronchovascular markings. No pleural effusion. No acute osseous abnormality.  IMPRESSION: Cardiomegaly with central vascular congestion but no acute abnormality.   Electronically Signed   By: Conchita Paris M.D.   On: 09/24/2014 17:36   Ct Head Wo Contrast  09/24/2014   CLINICAL DATA:  69 year old diabetic hypertensive female with hyperlipidemia and history of seizures. Fall. Denies loss of consciousness or hitting head. Initial encounter.  EXAM: CT HEAD WITHOUT CONTRAST  TECHNIQUE: Contiguous axial images were obtained from the base  of the skull through the vertex without intravenous contrast.  COMPARISON:  09/29/2012 brain MR.  09/25/2012 head CT.  FINDINGS: No skull fracture or intracranial hemorrhage.  Small vessel disease type changes without CT evidence of large acute infarct.  Mild global atrophy without hydrocephalus.  No intracranial mass lesion noted on this unenhanced exam.  Mild exophthalmos.  Mastoid air cells, middle ear cavities and visualized paranasal sinuses are clear.  Vascular calcifications.  IMPRESSION: No skull fracture or intracranial hemorrhage.  Small vessel disease type changes without CT evidence of large acute infarct.  Mild global atrophy without hydrocephalus.   Electronically Signed   By: Genia Del M.D.   On: 09/24/2014 17:41   Dg Knee Complete 4 Views Left  09/24/2014   CLINICAL DATA:  Fall today in kitchen, LEFT knee pain, unable support weight since fall  EXAM: LEFT KNEE - COMPLETE 4+ VIEW  COMPARISON:  05/04/2005  FINDINGS: Osseous demineralization.  Medial compartment joint space narrowing.  Patellar spurs at quadriceps and patellar tendon insertions.  No acute fracture, dislocation or bone destruction.  No knee joint effusion.  IMPRESSION: No acute osseous abnormalities.  Mild degenerative changes RIGHT knee.   Electronically Signed   By: Lavonia Dana M.D.   On: 09/24/2014 17:28   I have personally reviewed and evaluated these images and lab results as part of my medical decision-making.   EKG Interpretation   Date/Time:  Tuesday September 24 2014 17:05:48 EDT Ventricular Rate:  60 PR Interval:  194 QRS Duration: 78 QT Interval:  484 QTC Calculation: 484 R Axis:   -11 Text Interpretation:  Normal sinus rhythm Nonspecific ST and T wave  abnormality Abnormal ECG Confirmed by Hazle Coca [phone]) on 09/24/2014  5:10:49 PM      MDM   Final diagnoses:  Fall, initial encounter  Essential hypertension    Patient here for evaluation of injuries following a fall, markedly hypertensive prior to ED arrival. Patient's blood pressure improved without any intervention in the emergency department. BMP demonstrates stable renal insufficiency. Discussed with patient asymptomatic hypertension with outpatient PCP follow-up.    Quintella Reichert, MD 09/25/14 [phone]

## 2014-10-07 DIAGNOSIS — E559 Vitamin D deficiency, unspecified: Secondary | ICD-10-CM | POA: Diagnosis not present

## 2014-10-07 DIAGNOSIS — I509 Heart failure, unspecified: Secondary | ICD-10-CM | POA: Diagnosis not present

## 2014-10-07 DIAGNOSIS — E785 Hyperlipidemia, unspecified: Secondary | ICD-10-CM | POA: Diagnosis not present

## 2014-10-07 DIAGNOSIS — R569 Unspecified convulsions: Secondary | ICD-10-CM | POA: Diagnosis not present

## 2014-10-07 DIAGNOSIS — I10 Essential (primary) hypertension: Secondary | ICD-10-CM | POA: Diagnosis not present

## 2014-10-07 DIAGNOSIS — E1159 Type 2 diabetes mellitus with other circulatory complications: Secondary | ICD-10-CM | POA: Diagnosis not present

## 2014-10-08 ENCOUNTER — Emergency Department (HOSPITAL_COMMUNITY)
Admission: EM | Admit: 2014-10-08 | Discharge: 2014-10-08 | Disposition: A | Discharge status: Home / Self Care | Payer: Medicare Other | Attending: Emergency Medicine | Admitting: Emergency Medicine

## 2014-10-08 ENCOUNTER — Encounter (HOSPITAL_COMMUNITY): Payer: Self-pay | Admitting: Emergency Medicine

## 2014-10-08 DIAGNOSIS — Z791 Long term (current) use of non-steroidal anti-inflammatories (NSAID): Secondary | ICD-10-CM | POA: Insufficient documentation

## 2014-10-08 DIAGNOSIS — I158 Other secondary hypertension: Secondary | ICD-10-CM | POA: Diagnosis not present

## 2014-10-08 DIAGNOSIS — Z79899 Other long term (current) drug therapy: Secondary | ICD-10-CM | POA: Diagnosis not present

## 2014-10-08 DIAGNOSIS — I159 Secondary hypertension, unspecified: Secondary | ICD-10-CM | POA: Diagnosis not present

## 2014-10-08 DIAGNOSIS — E119 Type 2 diabetes mellitus without complications: Secondary | ICD-10-CM | POA: Diagnosis not present

## 2014-10-08 DIAGNOSIS — I129 Hypertensive chronic kidney disease with stage 1 through stage 4 chronic kidney disease, or unspecified chronic kidney disease: Secondary | ICD-10-CM | POA: Diagnosis present

## 2014-10-08 DIAGNOSIS — R6889 Other general symptoms and signs: Secondary | ICD-10-CM | POA: Diagnosis not present

## 2014-10-08 DIAGNOSIS — N189 Chronic kidney disease, unspecified: Secondary | ICD-10-CM | POA: Insufficient documentation

## 2014-10-08 DIAGNOSIS — K219 Gastro-esophageal reflux disease without esophagitis: Secondary | ICD-10-CM | POA: Diagnosis not present

## 2014-10-08 DIAGNOSIS — Z794 Long term (current) use of insulin: Secondary | ICD-10-CM | POA: Diagnosis not present

## 2014-10-08 LAB — BASIC METABOLIC PANEL
Anion gap: 3 — ABNORMAL LOW (ref 5–15)
BUN: 21 mg/dL — AB (ref 6–20)
CALCIUM: 8.4 mg/dL — AB (ref 8.9–10.3)
CO2: 28 mmol/L (ref 22–32)
CREATININE: 2.05 mg/dL — AB (ref 0.44–1.00)
Chloride: 106 mmol/L (ref 101–111)
GFR calc Af Amer: 28 mL/min — ABNORMAL LOW (ref >60)
GFR, EST NON AFRICAN AMERICAN: 24 mL/min — AB (ref >60)
GLUCOSE: 130 mg/dL — AB (ref 65–99)
POTASSIUM: 4.9 mmol/L (ref 3.5–5.1)
SODIUM: 137 mmol/L (ref 135–145)

## 2014-10-08 LAB — CBG MONITORING, ED: GLUCOSE-CAPILLARY: 111 mg/dL — AB (ref 65–99)

## 2014-10-08 NOTE — ED Provider Notes (Signed)
CSN: [id number]     Arrival date & time 10/08/14  1451 History   First MD Initiated Contact with Patient 10/08/14 1920     Chief Complaint  Patient presents with  . Hypertension     (Consider location/radiation/quality/duration/timing/severity/associated sxs/prior Treatment) HPI Comments: Patient here due to increased blood pressure at home. She is asymptomatic at this time. Denies any chest pain or shortness of breath. No palpitations. No severe headaches. No neurological findings. Seen by her physician recently and started on Norvasc yesterday. Blood pressure at home was 210/100 and patient called EMS and was transported here  Patient is a 69 y.o. female presenting with hypertension. The history is provided by the patient.  Hypertension    Past Medical History  Diagnosis Date  . Diabetes mellitus   . Seizures   . Hypertension   . Chronic kidney disease   . Hyperlipidemia   . GERD (gastroesophageal reflux disease)    History reviewed. No pertinent past surgical history. Family History  Problem Relation Age of Onset  . Hypertension Mother   . Hypertension Father   . Cancer Maternal Aunt    Social History  Substance Use Topics  . Smoking status: Never Smoker   . Smokeless tobacco: Never Used     Comment: EXPOSED TO 2ND HAND SMOKE X 20+ YEARS.   [name] Alcohol Use: No   OB History    No data available     Review of Systems  All other systems reviewed and are negative.     Allergies  Codeine  Home Medications   Prior to Admission medications   Medication Sig Start Date End Date Taking? Authorizing Provider  omeprazole (PRILOSEC) 40 MG capsule Take 40 mg by mouth daily.   Yes Historical Provider, MD  amLODipine (NORVASC) 10 MG tablet Take 10 mg by mouth daily.    Historical Provider, MD  azithromycin (ZITHROMAX) 250 MG tablet Take 1 tablet (250 mg total) by mouth daily. Take first 2 tablets together, then 1 every day until finished. Patient not taking: Reported on  09/24/2014 07/24/14   Hyman Bible, PA-C  carbamazepine (TEGRETOL) 200 MG tablet Take 400 mg by mouth 2 (two) times daily.    Historical Provider, MD  Cholecalciferol (VITAMIN D-3) 1000 UNITS CAPS Take 1,000 Units by mouth daily at 12 noon.     Historical Provider, MD  citalopram (CELEXA) 20 MG tablet Take 20 mg by mouth daily.    Historical Provider, MD  clobetasol cream (TEMOVATE) [id number] % Apply 1 application topically 2 (two) times daily as needed (for legs).     Historical Provider, MD  cloNIDine (CATAPRES) 0.2 MG tablet Take 0.2 mg by mouth 2 (two) times daily.    Historical Provider, MD  diclofenac sodium (VOLTAREN) 1 % GEL Apply 2 g topically 4 (four) times daily. Rub into affected area of foot 2 to 4 times daily 01/09/14   Bronson Ing, DPM  ezetimibe-simvastatin (VYTORIN) 10-40 MG per tablet Take 1 tablet by mouth daily.    Historical Provider, MD  furosemide (LASIX) 80 MG tablet Take 40 mg by mouth daily.  10/02/12   Nita Sells, MD  glipiZIDE (GLUCOTROL XL) 5 MG 24 hr tablet Take 5 mg by mouth daily with breakfast.    Historical Provider, MD  insulin NPH-regular Human (NOVOLIN 70/30) (70-30) 100 UNIT/ML injection Inject 30 Units into the skin 2 (two) times daily with a meal.    Historical Provider, MD  Lacosamide (VIMPAT) 100 MG TABS Take 1 tablet  by mouth 2 (two) times daily.    Historical Provider, MD  metoprolol succinate (TOPROL-XL) 50 MG 24 hr tablet Take 50 mg by mouth 2 (two) times daily. Take with or immediately following a meal.    Historical Provider, MD  Urea (ALUVEA) 39 % CREA Apply 1 application topically 2 (two) times daily. Patient not taking: Reported on 09/24/2014 02/28/13   Bronson Ing, DPM   BP 182/96 mmHg  Pulse 46  Temp(Src) 98.9 F (37.2 C) (Oral)  Resp 18  SpO2 97% Physical Exam  Constitutional: She is oriented to person, place, and time. She appears well-developed and well-nourished.  Non-toxic appearance. No distress.  HENT:  Head:  Normocephalic and atraumatic.  Eyes: Conjunctivae, EOM and lids are normal. Pupils are equal, round, and reactive to light.  Neck: Normal range of motion. Neck supple. No tracheal deviation present. No thyroid mass present.  Cardiovascular: Normal rate, regular rhythm and normal heart sounds.  Exam reveals no gallop.   No murmur heard. Pulmonary/Chest: Effort normal and breath sounds normal. No stridor. No respiratory distress. She has no decreased breath sounds. She has no wheezes. She has no rhonchi. She has no rales.  Abdominal: Soft. Normal appearance and bowel sounds are normal. She exhibits no distension. There is no tenderness. There is no rebound and no CVA tenderness.  Musculoskeletal: Normal range of motion. She exhibits no edema or tenderness.  Neurological: She is alert and oriented to person, place, and time. She has normal strength. No cranial nerve deficit or sensory deficit. GCS eye subscore is 4. GCS verbal subscore is 5. GCS motor subscore is 6.  Skin: Skin is warm and dry. No abrasion and no rash noted.  Psychiatric: She has a normal mood and affect. Her speech is normal and behavior is normal.  Nursing note and vitals reviewed.   ED Course  Procedures (including critical care time) Labs Review Labs Reviewed  BASIC METABOLIC PANEL - Abnormal; Notable for the following:    Glucose, Bld 130 (*)    BUN 21 (*)    Creatinine, Ser 2.05 (*)    Calcium 8.4 (*)    GFR calc non Af Amer 24 (*)    GFR calc Af Amer 28 (*)    Anion gap 3 (*)    All other components within normal limits  CBG MONITORING, ED - Abnormal; Notable for the following:    Glucose-Capillary 111 (*)    All other components within normal limits    Imaging Review No results found. I have personally reviewed and evaluated these images and lab results as part of my medical decision-making.   EKG Interpretation None      MDM   Final diagnoses:  None    Patient's blood pressure repeated and she  remains hypertensive. She has asymmetric at this time. Instructed to follow-up with her doctor tomorrow for blood pressure management    Lacretia Leigh, MD 10/08/14 2005

## 2014-10-08 NOTE — ED Notes (Signed)
Per EMS pt comes from home c/o high blood pressure 210/100.  Pt saw her PCP yesterday and was given med, BP came down and she was sent home.

## 2014-10-08 NOTE — Discharge Instructions (Signed)
Call your Dr. tomorrow so that he can adjust your blood pressure medication Hypertension Hypertension, commonly called high blood pressure, is when the force of blood pumping through your arteries is too strong. Your arteries are the blood vessels that carry blood from your heart throughout your body. A blood pressure reading consists of a higher number over a lower number, such as 110/72. The higher number (systolic) is the pressure inside your arteries when your heart pumps. The lower number (diastolic) is the pressure inside your arteries when your heart relaxes. Ideally you want your blood pressure below 120/80. Hypertension forces your heart to work harder to pump blood. Your arteries may become narrow or stiff. Having hypertension puts you at risk for heart disease, stroke, and other problems.  RISK FACTORS Some risk factors for high blood pressure are controllable. Others are not.  Risk factors you cannot control include:   Race. You may be at higher risk if you are African American.  Age. Risk increases with age.  Gender. Men are at higher risk than women before age 76 years. After age 104, women are at higher risk than men. Risk factors you can control include:  Not getting enough exercise or physical activity.  Being overweight.  Getting too much fat, sugar, calories, or salt in your diet.  Drinking too much alcohol. SIGNS AND SYMPTOMS Hypertension does not usually cause signs or symptoms. Extremely high blood pressure (hypertensive crisis) may cause headache, anxiety, shortness of breath, and nosebleed. DIAGNOSIS  To check if you have hypertension, your health care provider will measure your blood pressure while you are seated, with your arm held at the level of your heart. It should be measured at least twice using the same arm. Certain conditions can cause a difference in blood pressure between your right and left arms. A blood pressure reading that is higher than normal on one  occasion does not mean that you need treatment. If one blood pressure reading is high, ask your health care provider about having it checked again. TREATMENT  Treating high blood pressure includes making lifestyle changes and possibly taking medicine. Living a healthy lifestyle can help lower high blood pressure. You may need to change some of your habits. Lifestyle changes may include:  Following the DASH diet. This diet is high in fruits, vegetables, and whole grains. It is low in salt, red meat, and added sugars.  Getting at least 2 hours of brisk physical activity every week.  Losing weight if necessary.  Not smoking.  Limiting alcoholic beverages.  Learning ways to reduce stress. If lifestyle changes are not enough to get your blood pressure under control, your health care provider may prescribe medicine. You may need to take more than one. Work closely with your health care provider to understand the risks and benefits. HOME CARE INSTRUCTIONS  Have your blood pressure rechecked as directed by your health care provider.   Take medicines only as directed by your health care provider. Follow the directions carefully. Blood pressure medicines must be taken as prescribed. The medicine does not work as well when you skip doses. Skipping doses also puts you at risk for problems.   Do not smoke.   Monitor your blood pressure at home as directed by your health care provider. SEEK MEDICAL CARE IF:   You think you are having a reaction to medicines taken.  You have recurrent headaches or feel dizzy.  You have swelling in your ankles.  You have trouble with your vision.  SEEK IMMEDIATE MEDICAL CARE IF:  You develop a severe headache or confusion.  You have unusual weakness, numbness, or feel faint.  You have severe chest or abdominal pain.  You vomit repeatedly.  You have trouble breathing. MAKE SURE YOU:   Understand these instructions.  Will watch your  condition.  Will get help right away if you are not doing well or get worse. Document Released: 01/11/2005 Document Revised: 05/28/2013 Document Reviewed: 11/03/2012 [facility] Patient Information 2015 [place], [place]. This information is not intended to replace advice given to you by your health care provider. Make sure you discuss any questions you have with your health care provider.

## 2014-10-10 ENCOUNTER — Telehealth: Payer: Self-pay | Admitting: *Deleted

## 2014-10-10 ENCOUNTER — Ambulatory Visit: Payer: Medicare Other | Admitting: Neurology

## 2014-10-10 NOTE — Telephone Encounter (Signed)
No showed new patient appointment. 

## 2014-10-11 ENCOUNTER — Encounter: Payer: Self-pay | Admitting: Neurology

## 2014-10-18 DIAGNOSIS — E1159 Type 2 diabetes mellitus with other circulatory complications: Secondary | ICD-10-CM | POA: Diagnosis not present

## 2014-10-18 DIAGNOSIS — I509 Heart failure, unspecified: Secondary | ICD-10-CM | POA: Diagnosis not present

## 2014-10-18 DIAGNOSIS — E785 Hyperlipidemia, unspecified: Secondary | ICD-10-CM | POA: Diagnosis not present

## 2014-10-18 DIAGNOSIS — R569 Unspecified convulsions: Secondary | ICD-10-CM | POA: Diagnosis not present

## 2014-10-18 DIAGNOSIS — I10 Essential (primary) hypertension: Secondary | ICD-10-CM | POA: Diagnosis not present

## 2014-10-28 ENCOUNTER — Telehealth: Payer: Self-pay | Admitting: *Deleted

## 2014-10-28 NOTE — Telephone Encounter (Signed)
LVM for pt to call back to reschedule appt for tomorrow. Advised Dr. Jaynee Eagles had a family emergency and will not be in the office. Gave [facility] phone number and office hours to call back and r/s. Please r/s in NP slot with Dr. Jaynee Eagles or another provider if they have availability.

## 2014-10-28 NOTE — Telephone Encounter (Signed)
Could not leave a message on home phone number. Mailbox was full.

## 2014-10-29 ENCOUNTER — Ambulatory Visit: Payer: Medicare Other | Admitting: Neurology

## 2014-10-29 NOTE — Telephone Encounter (Signed)
LVM for pt to call back to reschedule appt for today because Dr Jaynee Eagles had a family emergency. Gave [facility] phone number

## 2014-10-29 NOTE — Telephone Encounter (Signed)
Left voicemail on cell phone for patient to call to reschedule appointment. Unable to leave message on home phone.

## 2014-10-29 NOTE — Telephone Encounter (Signed)
LVM for pt/daughter to call to reschedule appt for today. Gave [facility] phone number.

## 2014-10-30 NOTE — Telephone Encounter (Signed)
Pt r/s to 11/01/14 at 1230pm with Dr. Jaynee Eagles.

## 2014-10-31 DIAGNOSIS — R030 Elevated blood-pressure reading, without diagnosis of hypertension: Secondary | ICD-10-CM | POA: Diagnosis not present

## 2014-11-01 ENCOUNTER — Ambulatory Visit: Payer: Medicare Other | Admitting: Neurology

## 2014-11-06 ENCOUNTER — Ambulatory Visit: Payer: Medicaid Other | Admitting: Podiatry

## 2014-11-12 ENCOUNTER — Ambulatory Visit: Payer: Medicare Other | Admitting: Neurology

## 2014-11-14 ENCOUNTER — Encounter (HOSPITAL_COMMUNITY): Payer: Self-pay | Admitting: *Deleted

## 2014-11-14 ENCOUNTER — Emergency Department (HOSPITAL_COMMUNITY)
Admission: EM | Admit: 2014-11-14 | Discharge: 2014-11-15 | Disposition: A | Discharge status: Home / Self Care | Payer: Medicare Other | Attending: Emergency Medicine | Admitting: Emergency Medicine

## 2014-11-14 DIAGNOSIS — R001 Bradycardia, unspecified: Secondary | ICD-10-CM | POA: Diagnosis not present

## 2014-11-14 DIAGNOSIS — E785 Hyperlipidemia, unspecified: Secondary | ICD-10-CM | POA: Diagnosis not present

## 2014-11-14 DIAGNOSIS — Z794 Long term (current) use of insulin: Secondary | ICD-10-CM | POA: Insufficient documentation

## 2014-11-14 DIAGNOSIS — R531 Weakness: Secondary | ICD-10-CM

## 2014-11-14 DIAGNOSIS — Z79899 Other long term (current) drug therapy: Secondary | ICD-10-CM | POA: Insufficient documentation

## 2014-11-14 DIAGNOSIS — G40909 Epilepsy, unspecified, not intractable, without status epilepticus: Secondary | ICD-10-CM | POA: Insufficient documentation

## 2014-11-14 DIAGNOSIS — I129 Hypertensive chronic kidney disease with stage 1 through stage 4 chronic kidney disease, or unspecified chronic kidney disease: Secondary | ICD-10-CM | POA: Insufficient documentation

## 2014-11-14 DIAGNOSIS — K219 Gastro-esophageal reflux disease without esophagitis: Secondary | ICD-10-CM | POA: Insufficient documentation

## 2014-11-14 DIAGNOSIS — E119 Type 2 diabetes mellitus without complications: Secondary | ICD-10-CM | POA: Diagnosis not present

## 2014-11-14 DIAGNOSIS — R42 Dizziness and giddiness: Secondary | ICD-10-CM | POA: Diagnosis not present

## 2014-11-14 DIAGNOSIS — N189 Chronic kidney disease, unspecified: Secondary | ICD-10-CM | POA: Insufficient documentation

## 2014-11-14 DIAGNOSIS — R404 Transient alteration of awareness: Secondary | ICD-10-CM | POA: Diagnosis not present

## 2014-11-14 LAB — CBC
HCT: 32.4 % — ABNORMAL LOW (ref 36.0–46.0)
HEMOGLOBIN: 11.5 g/dL — AB (ref 12.0–15.0)
MCH: 31.2 pg (ref 26.0–34.0)
MCHC: 35.5 g/dL (ref 30.0–36.0)
MCV: 87.8 fL (ref 78.0–100.0)
PLATELETS: 208 10*3/uL (ref 150–400)
RBC: 3.69 MIL/uL — AB (ref 3.87–5.11)
RDW: 11.6 % (ref 11.5–15.5)
WBC: 3.1 10*3/uL — ABNORMAL LOW (ref 4.0–10.5)

## 2014-11-14 LAB — URINE MICROSCOPIC-ADD ON

## 2014-11-14 LAB — BASIC METABOLIC PANEL
ANION GAP: 7 (ref 5–15)
BUN: 41 mg/dL — ABNORMAL HIGH (ref 6–20)
CALCIUM: 8.8 mg/dL — AB (ref 8.9–10.3)
CO2: 28 mmol/L (ref 22–32)
Chloride: 98 mmol/L — ABNORMAL LOW (ref 101–111)
Creatinine, Ser: 2.53 mg/dL — ABNORMAL HIGH (ref 0.44–1.00)
GFR, EST AFRICAN AMERICAN: 21 mL/min — AB (ref >60)
GFR, EST NON AFRICAN AMERICAN: 19 mL/min — AB (ref >60)
Glucose, Bld: 138 mg/dL — ABNORMAL HIGH (ref 65–99)
Potassium: 4 mmol/L (ref 3.5–5.1)
Sodium: 133 mmol/L — ABNORMAL LOW (ref 135–145)

## 2014-11-14 LAB — URINALYSIS, ROUTINE W REFLEX MICROSCOPIC
Bilirubin Urine: NEGATIVE
Glucose, UA: NEGATIVE mg/dL
HGB URINE DIPSTICK: NEGATIVE
KETONES UR: NEGATIVE mg/dL
Nitrite: NEGATIVE
PROTEIN: NEGATIVE mg/dL
Specific Gravity, Urine: 1.007 (ref 1.005–1.030)
UROBILINOGEN UA: 0.2 mg/dL (ref 0.0–1.0)
pH: 5.5 (ref 5.0–8.0)

## 2014-11-14 LAB — CBG MONITORING, ED: GLUCOSE-CAPILLARY: 126 mg/dL — AB (ref 65–99)

## 2014-11-14 LAB — I-STAT TROPONIN, ED: Troponin i, poc: 0.01 ng/mL (ref 0.00–0.08)

## 2014-11-14 NOTE — ED Notes (Signed)
Meal tray ordered per MD

## 2014-11-14 NOTE — ED Provider Notes (Signed)
CSN: [id number]     Arrival date & time 11/14/14  1914 History   First MD Initiated Contact with Patient 11/14/14 1919     Chief Complaint  Patient presents with  . Dizziness     (Consider location/radiation/quality/duration/timing/severity/associated sxs/prior Treatment) HPI Patient complained of dizziness meaning lightheaded on standing onset upon awakening from a nap at 2 PM today. No treatment prior to coming here. She is presently asymptomatic. She denies any chest pain or any headache any abdominal pain no blood per rectum no black stools. No other associated symptoms Past Medical History  Diagnosis Date  . Diabetes mellitus   . Seizures ([place])   . Hypertension   . Chronic kidney disease   . Hyperlipidemia   . GERD (gastroesophageal reflux disease)    History reviewed. No pertinent past surgical history. Family History  Problem Relation Age of Onset  . Hypertension Mother   . Hypertension Father   . Cancer Maternal Aunt    Social History  Substance Use Topics  . Smoking status: Never Smoker   . Smokeless tobacco: Never Used     Comment: EXPOSED TO 2ND HAND SMOKE X 20+ YEARS.   [name] Alcohol Use: No   OB History    No data available     Review of Systems  Constitutional: Negative.   HENT: Negative.   Respiratory: Negative.   Cardiovascular: Negative.   Gastrointestinal: Negative.   Musculoskeletal: Positive for gait problem.       Walks with walker  Skin: Negative.   Allergic/Immunologic: Positive for immunocompromised state.       Diabetic  Neurological: Positive for light-headedness.  Psychiatric/Behavioral: Negative.   All other systems reviewed and are negative.     Allergies  Codeine  Home Medications   Prior to Admission medications   Medication Sig Start Date End Date Taking? Authorizing Provider  amLODipine (NORVASC) 10 MG tablet Take 10 mg by mouth daily.    Historical Provider, MD  Ascorbic Acid (VITAMIN C PO) Take 1 tablet by mouth daily.     Historical Provider, MD  azithromycin (ZITHROMAX) 250 MG tablet Take 1 tablet (250 mg total) by mouth daily. Take first 2 tablets together, then 1 every day until finished. Patient not taking: Reported on 09/24/2014 07/24/14   Hyman Bible, PA-C  carbamazepine (TEGRETOL) 200 MG tablet Take 400 mg by mouth 2 (two) times daily.    Historical Provider, MD  Cholecalciferol (VITAMIN D-3) 1000 UNITS CAPS Take 1,000 Units by mouth daily at 12 noon.     Historical Provider, MD  citalopram (CELEXA) 20 MG tablet Take 20 mg by mouth daily.    Historical Provider, MD  clobetasol cream (TEMOVATE) [id number] % Apply 1 application topically 2 (two) times daily as needed (for legs).     Historical Provider, MD  cloNIDine (CATAPRES) 0.2 MG tablet Take 0.2 mg by mouth 2 (two) times daily.    Historical Provider, MD  diclofenac sodium (VOLTAREN) 1 % GEL Apply 2 g topically 4 (four) times daily. Rub into affected area of foot 2 to 4 times daily 01/09/14   Bronson Ing, DPM  ezetimibe-simvastatin (VYTORIN) 10-40 MG per tablet Take 1 tablet by mouth daily.    Historical Provider, MD  furosemide (LASIX) 80 MG tablet Take 40 mg by mouth daily.  10/02/12   Nita Sells, MD  glipiZIDE (GLUCOTROL XL) 5 MG 24 hr tablet Take 5 mg by mouth daily with breakfast.    Historical Provider, MD  hydrALAZINE (APRESOLINE) 25  MG tablet Take 25 mg by mouth 2 (two) times daily. 10/07/14   Historical Provider, MD  insulin NPH-regular Human (NOVOLIN 70/30) (70-30) 100 UNIT/ML injection Inject 30 Units into the skin 2 (two) times daily with a meal.    Historical Provider, MD  Lacosamide (VIMPAT) 100 MG TABS Take 1 tablet by mouth 2 (two) times daily.    Historical Provider, MD  metoprolol succinate (TOPROL-XL) 50 MG 24 hr tablet Take 50 mg by mouth 2 (two) times daily. Take with or immediately following a meal.    Historical Provider, MD  omeprazole (PRILOSEC) 40 MG capsule Take 40 mg by mouth daily.    Historical Provider, MD  Urea  (ALUVEA) 39 % CREA Apply 1 application topically 2 (two) times daily. Patient not taking: Reported on 09/24/2014 02/28/13   Bronson Ing, DPM   There were no vitals taken for this visit. Physical Exam  Constitutional: She is oriented to person, place, and time. She appears well-developed and well-nourished.  HENT:  Head: Normocephalic and atraumatic.  Eyes: Conjunctivae are normal. Pupils are equal, round, and reactive to light.  Neck: Neck supple. No tracheal deviation present. No thyromegaly present.  Cardiovascular: Regular rhythm.   No murmur heard. Bradycardic  Pulmonary/Chest: Effort normal and breath sounds normal.  Abdominal: Soft. Bowel sounds are normal. She exhibits no distension. There is no tenderness.  Obese  Genitourinary: Guaiac negative stool.  Musculoskeletal: Normal range of motion. She exhibits no edema or tenderness.  Neurological: She is alert and oriented to person, place, and time. No cranial nerve deficit. Coordination normal.  Walks with minimal assistance. Not lightheaded on standing  Skin: Skin is warm and dry. No rash noted.  Psychiatric: She has a normal mood and affect.  Nursing note and vitals reviewed.   ED Course  Procedures (including critical care time) Labs Review Labs Reviewed  BASIC METABOLIC PANEL  CBC  URINALYSIS, ROUTINE W REFLEX MICROSCOPIC (NOT AT [facility])  CBG MONITORING, ED    Imaging Review No results found. I have personally reviewed and evaluated these images and lab results as part of my medical decision-making.   EKG Interpretation   Date/Time:  Thursday November 14 2014 19:35:27 EDT Ventricular Rate:  49 PR Interval:  230 QRS Duration: 88 QT Interval:  484 QTC Calculation: 437 R Axis:   14 Text Interpretation:  Sinus bradycardia Prolonged PR interval Low voltage,  precordial leads Since last tracing rate slower Confirmed by Winfred Leeds   MD, Siena Poehler ([id number]) on 11/14/2014 7:50:33 PM     11:20 PM patient  asymptomatic. Results for orders placed or performed during the hospital encounter of 0000000  Basic metabolic panel  Result Value Ref Range   Sodium 133 (L) 135 - 145 mmol/L   Potassium 4.0 3.5 - 5.1 mmol/L   Chloride 98 (L) 101 - 111 mmol/L   CO2 28 22 - 32 mmol/L   Glucose, Bld 138 (H) 65 - 99 mg/dL   BUN 41 (H) 6 - 20 mg/dL   Creatinine, Ser 2.53 (H) 0.44 - 1.00 mg/dL   Calcium 8.8 (L) 8.9 - 10.3 mg/dL   GFR calc non Af Amer 19 (L) >60 mL/min   GFR calc Af Amer 21 (L) >60 mL/min   Anion gap 7 5 - 15  CBC  Result Value Ref Range   WBC 3.1 (L) 4.0 - 10.5 K/uL   RBC 3.69 (L) 3.87 - 5.11 MIL/uL   Hemoglobin 11.5 (L) 12.0 - 15.0 g/dL   HCT 32.4 (  L) 36.0 - 46.0 %   MCV 87.8 78.0 - 100.0 fL   MCH 31.2 26.0 - 34.0 pg   MCHC 35.5 30.0 - 36.0 g/dL   RDW 11.6 11.5 - 15.5 %   Platelets 208 150 - 400 K/uL  Urinalysis, Routine w reflex microscopic (not at [facility])  Result Value Ref Range   Color, Urine YELLOW YELLOW   APPearance CLOUDY (A) CLEAR   Specific Gravity, Urine 1.007 1.005 - 1.030   pH 5.5 5.0 - 8.0   Glucose, UA NEGATIVE NEGATIVE mg/dL   Hgb urine dipstick NEGATIVE NEGATIVE   Bilirubin Urine NEGATIVE NEGATIVE   Ketones, ur NEGATIVE NEGATIVE mg/dL   Protein, ur NEGATIVE NEGATIVE mg/dL   Urobilinogen, UA 0.2 0.0 - 1.0 mg/dL   Nitrite NEGATIVE NEGATIVE   Leukocytes, UA TRACE (A) NEGATIVE  Urine microscopic-add on  Result Value Ref Range   Squamous Epithelial / LPF FEW (A) RARE   WBC, UA 3-6 <3 WBC/hpf   RBC / HPF 0-2 <3 RBC/hpf   Bacteria, UA RARE RARE   Urine-Other FEW YEAST   CBG monitoring, ED  Result Value Ref Range   Glucose-Capillary 126 (H) 65 - 99 mg/dL  I-stat troponin, ED  Result Value Ref Range   Troponin i, poc 0.01 0.00 - 0.08 ng/mL   Comment 3           No results found.  MDM  Bradycardia is asymptomatic  While in the ed, and with normal blood pressure. Pt with mildly worsening renal insufficiency. Pt may hbe mildy dehydrated , suggest no lasix  tomorrow f/u dr osei-bonsu Dx#1 weakness #2 renal insufficiency #3anemia Final diagnoses:  None        Orlie Dakin, MD 11/14/14 2325

## 2014-11-14 NOTE — ED Notes (Signed)
Pt arrives from home via [facility]. Pt c/o dizziness when standing to go to the bathroom. NO LOC or falls.

## 2014-11-14 NOTE — Discharge Instructions (Signed)
Weakness Don't take any furosemide tomorrow. We are concerned that you may be mildly dehydrated as a cause of your weakness. Call Dr.[name] tomorrow to schedule an office appointment for within the next 1 week. Tell him that you were seen here and had lab work done here. Return if your condition worsens for any reason. Weakness is a lack of strength. You may feel weak all over your body or just in one part of your body. Weakness can be serious. In some cases, you may need more medical tests. HOME [place] a well-balanced diet.  Try to exercise every day.  Only take medicines as told by your doctor. GET HELP RIGHT AWAY IF:   You cannot do your normal daily activities.  You cannot walk up and down stairs, or you feel very tired when you do so.  You have shortness of breath or chest pain.  You have trouble moving parts of your body.  You have weakness in only one body part or on only one side of the body.  You have a fever.  You have trouble speaking or swallowing.  You cannot control when you pee (urinate) or poop (bowel movement).  You have black or bloody throw up (vomit) or poop.  Your weakness gets worse or spreads to other body parts.  You have new aches or pains. MAKE SURE YOU:   Understand these instructions.  Will watch your condition.  Will get help right away if you are not doing well or get worse.   This information is not intended to replace advice given to you by your health care provider. Make sure you discuss any questions you have with your health care provider.   Document Released: 12/25/2007 Document Revised: 07/13/2011 Document Reviewed: 03/12/2011 Elsevier Interactive Patient Education [employer].

## 2014-11-22 ENCOUNTER — Encounter (INDEPENDENT_AMBULATORY_CARE_PROVIDER_SITE_OTHER): Payer: Self-pay

## 2014-11-22 ENCOUNTER — Encounter: Payer: Self-pay | Admitting: Neurology

## 2014-11-22 ENCOUNTER — Ambulatory Visit (INDEPENDENT_AMBULATORY_CARE_PROVIDER_SITE_OTHER): Payer: Medicare Other | Admitting: Neurology

## 2014-11-22 VITALS — BP 187/84 | HR 66 | Ht 62.0 in | Wt 241.8 lb

## 2014-11-22 DIAGNOSIS — R569 Unspecified convulsions: Secondary | ICD-10-CM

## 2014-11-22 MED ORDER — ELETRIPTAN HYDROBROMIDE 40 MG PO TABS: 40.0000 mg | ORAL_TABLET | ORAL | Status: DC | PRN

## 2014-11-22 NOTE — Patient Instructions (Signed)
Overall you are doing fairly well but I do want to suggest a few things today:   Remember to drink plenty of fluid, eat healthy meals and do not skip any meals. Try to eat protein with a every meal and eat a healthy snack such as fruit or nuts in between meals. Try to keep a regular sleep-wake schedule and try to exercise daily, particularly in the form of walking, 20-30 minutes a day, if you can.   As far as your medications are concerned, I would like to suggest: continue current medications  As far as diagnostic testing: none  I would like to see you back as needed, sooner if we need to. Please call us with any interim questions, concerns, problems, updates or refill requests.   Our phone number is [phone]. We also have an after hours call service for urgent matters and there is a physician on-call for urgent questions. For any emergencies you know to call 911 or go to the nearest emergency room
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
## 2014-11-23 LAB — CARBAMAZEPINE LEVEL, TOTAL: Carbamazepine Lvl: 10.6 ug/mL (ref 4.0–12.0)

## 2014-11-25 ENCOUNTER — Telehealth: Payer: Self-pay | Admitting: *Deleted

## 2014-11-25 NOTE — Telephone Encounter (Signed)
I have spoken with [name] this afternoon and per AA, have advised that labs were wnl.  She verbalized understanding of same/fim

## 2014-11-25 NOTE — Telephone Encounter (Signed)
-----   Message from Melvenia Beam, MD sent at 11/25/2014  7:27 AM EDT ----- Let patient know her lab was within normal limits thanks

## 2014-12-01 ENCOUNTER — Encounter: Payer: Self-pay | Admitting: Neurology

## 2014-12-09 DIAGNOSIS — Z0100 Encounter for examination of eyes and vision without abnormal findings: Secondary | ICD-10-CM | POA: Diagnosis not present

## 2014-12-09 DIAGNOSIS — Z136 Encounter for screening for cardiovascular disorders: Secondary | ICD-10-CM | POA: Diagnosis not present

## 2014-12-09 DIAGNOSIS — E1159 Type 2 diabetes mellitus with other circulatory complications: Secondary | ICD-10-CM | POA: Diagnosis not present

## 2014-12-09 DIAGNOSIS — Z0000 Encounter for general adult medical examination without abnormal findings: Secondary | ICD-10-CM | POA: Diagnosis not present

## 2014-12-09 DIAGNOSIS — E785 Hyperlipidemia, unspecified: Secondary | ICD-10-CM | POA: Diagnosis not present

## 2014-12-09 DIAGNOSIS — Z1389 Encounter for screening for other disorder: Secondary | ICD-10-CM | POA: Diagnosis not present

## 2014-12-09 DIAGNOSIS — Z01118 Encounter for examination of ears and hearing with other abnormal findings: Secondary | ICD-10-CM | POA: Diagnosis not present

## 2014-12-11 ENCOUNTER — Encounter (HOSPITAL_COMMUNITY): Payer: Self-pay

## 2014-12-11 ENCOUNTER — Inpatient Hospital Stay (HOSPITAL_COMMUNITY): Payer: Medicare Other

## 2014-12-11 ENCOUNTER — Inpatient Hospital Stay (HOSPITAL_COMMUNITY)
Admission: EM | Admit: 2014-12-11 | Discharge: 2014-12-21 | DRG: 982 | Disposition: A | Discharge status: Skilled Nursing Facility | Payer: Medicare Other | Attending: Internal Medicine | Admitting: Internal Medicine

## 2014-12-11 ENCOUNTER — Emergency Department (HOSPITAL_COMMUNITY): Payer: Medicare Other

## 2014-12-11 DIAGNOSIS — R0902 Hypoxemia: Secondary | ICD-10-CM | POA: Diagnosis not present

## 2014-12-11 DIAGNOSIS — R0600 Dyspnea, unspecified: Secondary | ICD-10-CM

## 2014-12-11 DIAGNOSIS — E875 Hyperkalemia: Secondary | ICD-10-CM | POA: Diagnosis not present

## 2014-12-11 DIAGNOSIS — G4733 Obstructive sleep apnea (adult) (pediatric): Secondary | ICD-10-CM | POA: Diagnosis not present

## 2014-12-11 DIAGNOSIS — G40909 Epilepsy, unspecified, not intractable, without status epilepticus: Secondary | ICD-10-CM | POA: Diagnosis present

## 2014-12-11 DIAGNOSIS — Z8249 Family history of ischemic heart disease and other diseases of the circulatory system: Secondary | ICD-10-CM | POA: Diagnosis not present

## 2014-12-11 DIAGNOSIS — E119 Type 2 diabetes mellitus without complications: Secondary | ICD-10-CM

## 2014-12-11 DIAGNOSIS — R7989 Other specified abnormal findings of blood chemistry: Secondary | ICD-10-CM

## 2014-12-11 DIAGNOSIS — I10 Essential (primary) hypertension: Secondary | ICD-10-CM | POA: Diagnosis not present

## 2014-12-11 DIAGNOSIS — N2581 Secondary hyperparathyroidism of renal origin: Secondary | ICD-10-CM | POA: Diagnosis not present

## 2014-12-11 DIAGNOSIS — E860 Dehydration: Secondary | ICD-10-CM | POA: Diagnosis present

## 2014-12-11 DIAGNOSIS — T447X5A Adverse effect of beta-adrenoreceptor antagonists, initial encounter: Secondary | ICD-10-CM | POA: Diagnosis not present

## 2014-12-11 DIAGNOSIS — Z419 Encounter for procedure for purposes other than remedying health state, unspecified: Secondary | ICD-10-CM

## 2014-12-11 DIAGNOSIS — I878 Other specified disorders of veins: Secondary | ICD-10-CM | POA: Diagnosis not present

## 2014-12-11 DIAGNOSIS — R8299 Other abnormal findings in urine: Secondary | ICD-10-CM | POA: Diagnosis not present

## 2014-12-11 DIAGNOSIS — E785 Hyperlipidemia, unspecified: Secondary | ICD-10-CM | POA: Diagnosis not present

## 2014-12-11 DIAGNOSIS — I82402 Acute embolism and thrombosis of unspecified deep veins of left lower extremity: Secondary | ICD-10-CM

## 2014-12-11 DIAGNOSIS — K219 Gastro-esophageal reflux disease without esophagitis: Secondary | ICD-10-CM | POA: Diagnosis not present

## 2014-12-11 DIAGNOSIS — R05 Cough: Secondary | ICD-10-CM

## 2014-12-11 DIAGNOSIS — E1100 Type 2 diabetes mellitus with hyperosmolarity without nonketotic hyperglycemic-hyperosmolar coma (NKHHC): Secondary | ICD-10-CM

## 2014-12-11 DIAGNOSIS — Z6841 Body Mass Index (BMI) 40.0 and over, adult: Secondary | ICD-10-CM

## 2014-12-11 DIAGNOSIS — M7989 Other specified soft tissue disorders: Secondary | ICD-10-CM | POA: Diagnosis not present

## 2014-12-11 DIAGNOSIS — R29898 Other symptoms and signs involving the musculoskeletal system: Secondary | ICD-10-CM | POA: Diagnosis not present

## 2014-12-11 DIAGNOSIS — I509 Heart failure, unspecified: Secondary | ICD-10-CM | POA: Diagnosis not present

## 2014-12-11 DIAGNOSIS — I440 Atrioventricular block, first degree: Secondary | ICD-10-CM | POA: Diagnosis present

## 2014-12-11 DIAGNOSIS — I5032 Chronic diastolic (congestive) heart failure: Secondary | ICD-10-CM | POA: Diagnosis present

## 2014-12-11 DIAGNOSIS — Z79899 Other long term (current) drug therapy: Secondary | ICD-10-CM | POA: Diagnosis not present

## 2014-12-11 DIAGNOSIS — I129 Hypertensive chronic kidney disease with stage 1 through stage 4 chronic kidney disease, or unspecified chronic kidney disease: Secondary | ICD-10-CM | POA: Diagnosis present

## 2014-12-11 DIAGNOSIS — K5900 Constipation, unspecified: Secondary | ICD-10-CM | POA: Diagnosis present

## 2014-12-11 DIAGNOSIS — N289 Disorder of kidney and ureter, unspecified: Secondary | ICD-10-CM

## 2014-12-11 DIAGNOSIS — Z7722 Contact with and (suspected) exposure to environmental tobacco smoke (acute) (chronic): Secondary | ICD-10-CM | POA: Diagnosis present

## 2014-12-11 DIAGNOSIS — R001 Bradycardia, unspecified: Secondary | ICD-10-CM | POA: Diagnosis not present

## 2014-12-11 DIAGNOSIS — N390 Urinary tract infection, site not specified: Secondary | ICD-10-CM | POA: Diagnosis not present

## 2014-12-11 DIAGNOSIS — E1122 Type 2 diabetes mellitus with diabetic chronic kidney disease: Secondary | ICD-10-CM | POA: Diagnosis present

## 2014-12-11 DIAGNOSIS — I5030 Unspecified diastolic (congestive) heart failure: Secondary | ICD-10-CM | POA: Diagnosis not present

## 2014-12-11 DIAGNOSIS — Z794 Long term (current) use of insulin: Secondary | ICD-10-CM | POA: Diagnosis not present

## 2014-12-11 DIAGNOSIS — N189 Chronic kidney disease, unspecified: Secondary | ICD-10-CM

## 2014-12-11 DIAGNOSIS — G4089 Other seizures: Secondary | ICD-10-CM | POA: Diagnosis not present

## 2014-12-11 DIAGNOSIS — F329 Major depressive disorder, single episode, unspecified: Secondary | ICD-10-CM | POA: Diagnosis present

## 2014-12-11 DIAGNOSIS — R110 Nausea: Secondary | ICD-10-CM

## 2014-12-11 DIAGNOSIS — G9340 Encephalopathy, unspecified: Secondary | ICD-10-CM

## 2014-12-11 DIAGNOSIS — Z885 Allergy status to narcotic agent status: Secondary | ICD-10-CM

## 2014-12-11 DIAGNOSIS — T465X5A Adverse effect of other antihypertensive drugs, initial encounter: Secondary | ICD-10-CM | POA: Diagnosis not present

## 2014-12-11 DIAGNOSIS — I998 Other disorder of circulatory system: Secondary | ICD-10-CM | POA: Diagnosis not present

## 2014-12-11 DIAGNOSIS — D631 Anemia in chronic kidney disease: Secondary | ICD-10-CM | POA: Diagnosis present

## 2014-12-11 DIAGNOSIS — N183 Chronic kidney disease, stage 3 unspecified: Secondary | ICD-10-CM | POA: Diagnosis present

## 2014-12-11 DIAGNOSIS — I749 Embolism and thrombosis of unspecified artery: Secondary | ICD-10-CM | POA: Diagnosis not present

## 2014-12-11 DIAGNOSIS — R778 Other specified abnormalities of plasma proteins: Secondary | ICD-10-CM

## 2014-12-11 DIAGNOSIS — I742 Embolism and thrombosis of arteries of the upper extremities: Secondary | ICD-10-CM

## 2014-12-11 DIAGNOSIS — J811 Chronic pulmonary edema: Secondary | ICD-10-CM | POA: Diagnosis not present

## 2014-12-11 DIAGNOSIS — Z91128 Patient's intentional underdosing of medication regimen for other reason: Secondary | ICD-10-CM

## 2014-12-11 DIAGNOSIS — R0602 Shortness of breath: Secondary | ICD-10-CM

## 2014-12-11 DIAGNOSIS — N184 Chronic kidney disease, stage 4 (severe): Secondary | ICD-10-CM | POA: Diagnosis not present

## 2014-12-11 DIAGNOSIS — R251 Tremor, unspecified: Secondary | ICD-10-CM

## 2014-12-11 DIAGNOSIS — R011 Cardiac murmur, unspecified: Secondary | ICD-10-CM | POA: Diagnosis present

## 2014-12-11 DIAGNOSIS — R112 Nausea with vomiting, unspecified: Secondary | ICD-10-CM | POA: Diagnosis present

## 2014-12-11 DIAGNOSIS — F339 Major depressive disorder, recurrent, unspecified: Secondary | ICD-10-CM | POA: Diagnosis not present

## 2014-12-11 DIAGNOSIS — T501X6A Underdosing of loop [high-ceiling] diuretics, initial encounter: Secondary | ICD-10-CM | POA: Diagnosis not present

## 2014-12-11 DIAGNOSIS — N179 Acute kidney failure, unspecified: Principal | ICD-10-CM

## 2014-12-11 DIAGNOSIS — M6281 Muscle weakness (generalized): Secondary | ICD-10-CM | POA: Diagnosis not present

## 2014-12-11 DIAGNOSIS — I4891 Unspecified atrial fibrillation: Secondary | ICD-10-CM | POA: Diagnosis not present

## 2014-12-11 DIAGNOSIS — R1111 Vomiting without nausea: Secondary | ICD-10-CM | POA: Diagnosis not present

## 2014-12-11 DIAGNOSIS — N185 Chronic kidney disease, stage 5: Secondary | ICD-10-CM | POA: Diagnosis not present

## 2014-12-11 DIAGNOSIS — R1110 Vomiting, unspecified: Secondary | ICD-10-CM | POA: Diagnosis present

## 2014-12-11 DIAGNOSIS — R059 Cough, unspecified: Secondary | ICD-10-CM

## 2014-12-11 DIAGNOSIS — R569 Unspecified convulsions: Secondary | ICD-10-CM

## 2014-12-11 HISTORY — DX: Essential (primary) hypertension: I10

## 2014-12-11 HISTORY — DX: Type 2 diabetes mellitus without complications: E11.9

## 2014-12-11 HISTORY — DX: Anemia, unspecified: D64.9

## 2014-12-11 HISTORY — DX: Chronic kidney disease, stage 4 (severe): N18.4

## 2014-12-11 LAB — CREATININE, SERUM
CREATININE: 3.74 mg/dL — AB (ref 0.44–1.00)
GFR calc Af Amer: 13 mL/min — ABNORMAL LOW (ref >60)
GFR calc non Af Amer: 12 mL/min — ABNORMAL LOW (ref >60)

## 2014-12-11 LAB — CBC WITH DIFFERENTIAL/PLATELET
BASOS PCT: 0 %
Basophils Absolute: 0 10*3/uL (ref 0.0–0.1)
Eosinophils Absolute: 0 10*3/uL (ref 0.0–0.7)
Eosinophils Relative: 0 %
HEMATOCRIT: 30.4 % — AB (ref 36.0–46.0)
Hemoglobin: 10.2 g/dL — ABNORMAL LOW (ref 12.0–15.0)
LYMPHS ABS: 0.9 10*3/uL (ref 0.7–4.0)
Lymphocytes Relative: 12 %
MCH: 30.2 pg (ref 26.0–34.0)
MCHC: 33.6 g/dL (ref 30.0–36.0)
MCV: 89.9 fL (ref 78.0–100.0)
MONO ABS: 0.9 10*3/uL (ref 0.1–1.0)
MONOS PCT: 12 %
NEUTROS ABS: 5.4 10*3/uL (ref 1.7–7.7)
Neutrophils Relative %: 76 %
Platelets: 197 10*3/uL (ref 150–400)
RBC: 3.38 MIL/uL — ABNORMAL LOW (ref 3.87–5.11)
RDW: 12.2 % (ref 11.5–15.5)
WBC: 7.1 10*3/uL (ref 4.0–10.5)

## 2014-12-11 LAB — COMPREHENSIVE METABOLIC PANEL
ALK PHOS: 95 U/L (ref 38–126)
ALT: 15 U/L (ref 14–54)
ANION GAP: 10 (ref 5–15)
AST: 20 U/L (ref 15–41)
Albumin: 3.5 g/dL (ref 3.5–5.0)
BILIRUBIN TOTAL: 0.7 mg/dL (ref 0.3–1.2)
BUN: 58 mg/dL — ABNORMAL HIGH (ref 6–20)
CALCIUM: 8.6 mg/dL — AB (ref 8.9–10.3)
CO2: 24 mmol/L (ref 22–32)
CREATININE: 4.02 mg/dL — AB (ref 0.44–1.00)
Chloride: 103 mmol/L (ref 101–111)
GFR, EST AFRICAN AMERICAN: 12 mL/min — AB (ref >60)
GFR, EST NON AFRICAN AMERICAN: 11 mL/min — AB (ref >60)
Glucose, Bld: 202 mg/dL — ABNORMAL HIGH (ref 65–99)
Potassium: 4.5 mmol/L (ref 3.5–5.1)
SODIUM: 137 mmol/L (ref 135–145)
TOTAL PROTEIN: 6.7 g/dL (ref 6.5–8.1)

## 2014-12-11 LAB — CBC
HEMATOCRIT: 30.6 % — AB (ref 36.0–46.0)
HEMOGLOBIN: 10.4 g/dL — AB (ref 12.0–15.0)
MCH: 30.4 pg (ref 26.0–34.0)
MCHC: 34 g/dL (ref 30.0–36.0)
MCV: 89.5 fL (ref 78.0–100.0)
Platelets: 202 10*3/uL (ref 150–400)
RBC: 3.42 MIL/uL — AB (ref 3.87–5.11)
RDW: 12.2 % (ref 11.5–15.5)
WBC: 7.3 10*3/uL (ref 4.0–10.5)

## 2014-12-11 LAB — URINE MICROSCOPIC-ADD ON
Bacteria, UA: NONE SEEN
RBC / HPF: NONE SEEN RBC/hpf (ref 0–5)

## 2014-12-11 LAB — BRAIN NATRIURETIC PEPTIDE: B Natriuretic Peptide: 1398.1 pg/mL — ABNORMAL HIGH (ref 0.0–100.0)

## 2014-12-11 LAB — URINALYSIS, ROUTINE W REFLEX MICROSCOPIC
BILIRUBIN URINE: NEGATIVE
Glucose, UA: NEGATIVE mg/dL
Hgb urine dipstick: NEGATIVE
KETONES UR: NEGATIVE mg/dL
NITRITE: NEGATIVE
PROTEIN: 30 mg/dL — AB
Specific Gravity, Urine: 1.016 (ref 1.005–1.030)
pH: 5 (ref 5.0–8.0)

## 2014-12-11 LAB — TSH: TSH: 1 u[IU]/mL (ref 0.350–4.500)

## 2014-12-11 LAB — GLUCOSE, CAPILLARY
Glucose-Capillary: 181 mg/dL — ABNORMAL HIGH (ref 65–99)
Glucose-Capillary: 204 mg/dL — ABNORMAL HIGH (ref 65–99)
Glucose-Capillary: 123 mg/dL — ABNORMAL HIGH (ref 65–99)

## 2014-12-11 LAB — TROPONIN I
TROPONIN I: 0.49 ng/mL — AB (ref <0.031)
TROPONIN I: 0.57 ng/mL — AB (ref <0.031)
Troponin I: 0.28 ng/mL — ABNORMAL HIGH (ref <0.031)

## 2014-12-11 MED ORDER — CETYLPYRIDINIUM CHLORIDE 0.05 % MT LIQD
7.0000 mL | Freq: Two times a day (BID) | OROMUCOSAL | Status: DC
  Administered 2014-12-11 – 2014-12-21 (×16): 7 mL via OROMUCOSAL

## 2014-12-11 MED ORDER — HYDRALAZINE HCL 20 MG/ML IJ SOLN
10.0000 mg | Freq: Four times a day (QID) | INTRAMUSCULAR | Status: DC | PRN
  Administered 2014-12-15 (×2): 10 mg via INTRAVENOUS
  Filled 2014-12-11 (×2): qty 1

## 2014-12-11 MED ORDER — PNEUMOCOCCAL VAC POLYVALENT 25 MCG/0.5ML IJ INJ
0.5000 mL | INJECTION | INTRAMUSCULAR | Status: AC
  Administered 2014-12-16: 0.5 mL via INTRAMUSCULAR
  Filled 2014-12-11: qty 0.5

## 2014-12-11 MED ORDER — INFLUENZA VAC SPLIT QUAD 0.5 ML IM SUSY
0.5000 mL | PREFILLED_SYRINGE | INTRAMUSCULAR | Status: AC
  Administered 2014-12-16: 0.5 mL via INTRAMUSCULAR
  Filled 2014-12-11 (×2): qty 0.5

## 2014-12-11 MED ORDER — SODIUM CHLORIDE 0.9 % IJ SOLN: 3.0000 mL | INTRAMUSCULAR | Status: DC | PRN

## 2014-12-11 MED ORDER — SODIUM CHLORIDE 0.9 % IJ SOLN
3.0000 mL | Freq: Two times a day (BID) | INTRAMUSCULAR | Status: DC
  Administered 2014-12-11 (×2): 3 mL via INTRAVENOUS

## 2014-12-11 MED ORDER — PANTOPRAZOLE SODIUM 40 MG PO TBEC
40.0000 mg | DELAYED_RELEASE_TABLET | Freq: Every day | ORAL | Status: DC
  Administered 2014-12-11 – 2014-12-21 (×11): 40 mg via ORAL
  Filled 2014-12-11 (×11): qty 1

## 2014-12-11 MED ORDER — CLONIDINE HCL 0.2 MG PO TABS
0.2000 mg | ORAL_TABLET | Freq: Two times a day (BID) | ORAL | Status: DC
  Administered 2014-12-11: 0.2 mg via ORAL
  Filled 2014-12-11: qty 1

## 2014-12-11 MED ORDER — SODIUM CHLORIDE 0.9 % IV SOLN: 250.0000 mL | INTRAVENOUS | Status: DC | PRN

## 2014-12-11 MED ORDER — INSULIN ASPART 100 UNIT/ML ~~LOC~~ SOLN
0.0000 [IU] | Freq: Three times a day (TID) | SUBCUTANEOUS | Status: DC
  Administered 2014-12-11 – 2014-12-12 (×2): 5 [IU] via SUBCUTANEOUS
  Administered 2014-12-12: 2 [IU] via SUBCUTANEOUS
  Administered 2014-12-12 – 2014-12-14 (×2): 3 [IU] via SUBCUTANEOUS
  Administered 2014-12-14: 2 [IU] via SUBCUTANEOUS
  Administered 2014-12-15: 8 [IU] via SUBCUTANEOUS
  Administered 2014-12-15: 5 [IU] via SUBCUTANEOUS
  Administered 2014-12-16: 3 [IU] via SUBCUTANEOUS
  Administered 2014-12-16: 5 [IU] via SUBCUTANEOUS
  Administered 2014-12-16: 2 [IU] via SUBCUTANEOUS
  Administered 2014-12-17: 3 [IU] via SUBCUTANEOUS
  Administered 2014-12-17: 2 [IU] via SUBCUTANEOUS
  Administered 2014-12-18: 1 [IU] via SUBCUTANEOUS
  Administered 2014-12-18 – 2014-12-21 (×5): 2 [IU] via SUBCUTANEOUS

## 2014-12-11 MED ORDER — INSULIN GLARGINE 100 UNIT/ML ~~LOC~~ SOLN
15.0000 [IU] | Freq: Every day | SUBCUTANEOUS | Status: DC
Start: 2014-12-11 — End: 2014-12-21
  Administered 2014-12-11 – 2014-12-20 (×10): 15 [IU] via SUBCUTANEOUS
  Filled 2014-12-11 (×13): qty 0.15

## 2014-12-11 MED ORDER — ACETAMINOPHEN 325 MG PO TABS
650.0000 mg | ORAL_TABLET | Freq: Four times a day (QID) | ORAL | Status: DC | PRN
  Administered 2014-12-11 – 2014-12-19 (×7): 650 mg via ORAL
  Filled 2014-12-11 (×7): qty 2

## 2014-12-11 MED ORDER — ASPIRIN 81 MG PO CHEW
81.0000 mg | CHEWABLE_TABLET | Freq: Every day | ORAL | Status: DC
  Administered 2014-12-11 – 2014-12-21 (×11): 81 mg via ORAL
  Filled 2014-12-11 (×11): qty 1

## 2014-12-11 MED ORDER — CARBAMAZEPINE 200 MG PO TABS
400.0000 mg | ORAL_TABLET | Freq: Two times a day (BID) | ORAL | Status: DC
  Administered 2014-12-11 – 2014-12-21 (×18): 400 mg via ORAL
  Filled 2014-12-11 (×19): qty 2

## 2014-12-11 MED ORDER — EZETIMIBE-SIMVASTATIN 10-40 MG PO TABS
1.0000 | ORAL_TABLET | Freq: Every day | ORAL | Status: DC
  Administered 2014-12-11 – 2014-12-20 (×9): 1 via ORAL
  Filled 2014-12-11 (×12): qty 1

## 2014-12-11 MED ORDER — FUROSEMIDE 40 MG PO TABS
40.0000 mg | ORAL_TABLET | Freq: Every day | ORAL | Status: DC
  Administered 2014-12-11: 40 mg via ORAL
  Filled 2014-12-11: qty 1

## 2014-12-11 MED ORDER — FUROSEMIDE 10 MG/ML IJ SOLN: 40.0000 mg | Freq: Once | INTRAMUSCULAR | Status: DC

## 2014-12-11 MED ORDER — CITALOPRAM HYDROBROMIDE 20 MG PO TABS
20.0000 mg | ORAL_TABLET | Freq: Every day | ORAL | Status: DC
  Administered 2014-12-11 – 2014-12-21 (×11): 20 mg via ORAL
  Filled 2014-12-11 (×11): qty 1

## 2014-12-11 MED ORDER — LACOSAMIDE 50 MG PO TABS
100.0000 mg | ORAL_TABLET | Freq: Two times a day (BID) | ORAL | Status: DC
  Administered 2014-12-11 – 2014-12-21 (×20): 100 mg via ORAL
  Filled 2014-12-11 (×20): qty 2

## 2014-12-11 MED ORDER — ONDANSETRON HCL 4 MG/2ML IJ SOLN
4.0000 mg | Freq: Four times a day (QID) | INTRAMUSCULAR | Status: DC | PRN
  Administered 2014-12-12 – 2014-12-21 (×5): 4 mg via INTRAVENOUS
  Filled 2014-12-11 (×6): qty 2

## 2014-12-11 MED ORDER — HEPARIN SODIUM (PORCINE) 5000 UNIT/ML IJ SOLN
5000.0000 [IU] | Freq: Three times a day (TID) | INTRAMUSCULAR | Status: DC
  Administered 2014-12-11 – 2014-12-15 (×12): 5000 [IU] via SUBCUTANEOUS
  Filled 2014-12-11 (×12): qty 1

## 2014-12-11 MED ORDER — LACOSAMIDE 100 MG PO TABS: 1.0000 | ORAL_TABLET | Freq: Two times a day (BID) | ORAL | Status: DC

## 2014-12-11 MED ORDER — NITROGLYCERIN 2 % TD OINT
1.0000 [in_us] | TOPICAL_OINTMENT | Freq: Once | TRANSDERMAL | Status: AC
  Administered 2014-12-11: 1 [in_us] via TOPICAL
  Filled 2014-12-11: qty 1

## 2014-12-11 MED ORDER — FLUCONAZOLE 100 MG PO TABS
100.0000 mg | ORAL_TABLET | Freq: Every day | ORAL | Status: DC
  Administered 2014-12-11 – 2014-12-16 (×6): 100 mg via ORAL
  Filled 2014-12-11 (×7): qty 1

## 2014-12-11 MED ORDER — INSULIN ASPART 100 UNIT/ML ~~LOC~~ SOLN
4.0000 [IU] | Freq: Three times a day (TID) | SUBCUTANEOUS | Status: DC
  Administered 2014-12-11 – 2014-12-18 (×15): 4 [IU] via SUBCUTANEOUS

## 2014-12-11 MED ORDER — DOCUSATE SODIUM 100 MG PO CAPS
100.0000 mg | ORAL_CAPSULE | Freq: Two times a day (BID) | ORAL | Status: DC
  Administered 2014-12-11 – 2014-12-12 (×3): 100 mg via ORAL
  Filled 2014-12-11 (×3): qty 1

## 2014-12-11 MED ORDER — ACETAMINOPHEN 650 MG RE SUPP: 650.0000 mg | Freq: Four times a day (QID) | RECTAL | Status: DC | PRN

## 2014-12-11 MED ORDER — AMLODIPINE BESYLATE 10 MG PO TABS
10.0000 mg | ORAL_TABLET | Freq: Every day | ORAL | Status: DC
  Administered 2014-12-11 – 2014-12-21 (×11): 10 mg via ORAL
  Filled 2014-12-11 (×12): qty 1

## 2014-12-11 MED ORDER — HYDRALAZINE HCL 50 MG PO TABS
50.0000 mg | ORAL_TABLET | Freq: Three times a day (TID) | ORAL | Status: DC
  Administered 2014-12-11 – 2014-12-16 (×13): 50 mg via ORAL
  Filled 2014-12-11 (×13): qty 1

## 2014-12-11 MED ORDER — SENNOSIDES-DOCUSATE SODIUM 8.6-50 MG PO TABS: 1.0000 | ORAL_TABLET | Freq: Every evening | ORAL | Status: DC | PRN

## 2014-12-11 MED ORDER — CLOBETASOL PROPIONATE 0.05 % EX CREA
1.0000 "application " | TOPICAL_CREAM | Freq: Two times a day (BID) | CUTANEOUS | Status: DC | PRN
  Filled 2014-12-11: qty 15

## 2014-12-11 MED ORDER — SODIUM CHLORIDE 0.9 % IJ SOLN
3.0000 mL | Freq: Two times a day (BID) | INTRAMUSCULAR | Status: DC
  Administered 2014-12-11: 3 mL via INTRAVENOUS

## 2014-12-11 MED ORDER — FUROSEMIDE 10 MG/ML IJ SOLN
20.0000 mg | Freq: Once | INTRAMUSCULAR | Status: AC
  Administered 2014-12-11: 20 mg via INTRAVENOUS
  Filled 2014-12-11: qty 2

## 2014-12-11 MED ORDER — METOCLOPRAMIDE HCL 5 MG/ML IJ SOLN: 5.0000 mg | Freq: Four times a day (QID) | INTRAMUSCULAR | Status: DC | PRN

## 2014-12-11 MED ORDER — HYDRALAZINE HCL 25 MG PO TABS
25.0000 mg | ORAL_TABLET | Freq: Two times a day (BID) | ORAL | Status: DC
  Administered 2014-12-11: 25 mg via ORAL
  Filled 2014-12-11: qty 1

## 2014-12-11 NOTE — Progress Notes (Signed)
CRITICAL VALUE ALERT  Critical value received:  0.57 Troponin Date of notification:  12/11/14  Time of notification:  2046  Critical value read back:Yes.    Nurse who received alert:  Ronette Deter  MD notified (1st page):  Baltazar Najjar, NP  Time of first page:  2046  MD notified (2nd page):  Time of second page:  Responding MD:  Baltazar Najjar, NP  Time MD responded:  2050

## 2014-12-11 NOTE — Progress Notes (Signed)
Called by Telemetry that Pt HR down to 26 and had a 5.24 second pause. Asymptomatic, BP 150/45. Patient awake, getting Echo done.  Cardiology made aware, no new orders at this time. Call MD for Pauses greater than 3 seconds or asymptomatic.   EKG obtained.

## 2014-12-11 NOTE — ED Provider Notes (Signed)
CSN: [place]:[id number]     Arrival date & time 12/11/14  0803 History   First MD Initiated Contact with Patient 12/11/14 [phone]     Chief Complaint  Patient presents with  . Emesis  . Bradycardia   (Consider location/radiation/quality/duration/timing/severity/associated sxs/prior Treatment) Patient is a 69 y.o. female presenting with vomiting. The history is provided by the patient.  Emesis Severity:  Moderate Duration:  1 day Timing:  Intermittent Quality:  Stomach contents Able to tolerate:  Solids and liquids Progression:  Unchanged Chronicity:  New Recent urination:  Normal Context: not post-tussive and not self-induced   Relieved by:  Nothing Worsened by:  Nothing tried Ineffective treatments:  None tried Associated symptoms: no abdominal pain, no chills, no diarrhea, no fever and no headaches   Risk factors: diabetes   Risk factors: no sick contacts    Patient is a 69 year old female with history of DM, HTN, HLD, CHF, and seizures who presents with 1 day of nausea and vomiting. Patient reports that she had 2 episodes of emesis yesterday and again 2 episodes of emesis this morning. Emesis was nonbloody, nonbilious and described as yellow. No known sick contacts. No known precipitants. No abdominal pain or diarrhea. Last bowel movement yesterday. No fevers or chills. No dysuria. En route to the ED, patient was noted to bradycardic to the 30s per EMS. Patient denies any chest pain or shortness of breath. Reports that she has been compliant with her medications with no missed doses. Patient has not taken any of her medications today.   Past Medical History  Diagnosis Date  . Diabetes mellitus   . Seizures ([place])   . Hypertension   . Chronic kidney disease   . Hyperlipidemia   . GERD (gastroesophageal reflux disease)   . Morbid obesity ([place])   . Depression   . CHF (congestive heart failure) [facility])    Past Surgical History  Procedure Laterality Date  . No surgical history      Family History  Problem Relation Age of Onset  . Hypertension Mother   . Hypertension Father   . Cancer Maternal Aunt   . Seizures Neg Hx    Social History  Substance Use Topics  . Smoking status: Never Smoker   . Smokeless tobacco: Never Used     Comment: EXPOSED TO 2ND HAND SMOKE X 20+ YEARS.   [name] Alcohol Use: No   OB History    No data available     Review of Systems  Constitutional: Negative for chills.  HENT: Negative.   Eyes: Negative.   Respiratory: Negative.   Cardiovascular: Negative.   Gastrointestinal: Positive for nausea and vomiting. Negative for abdominal pain and diarrhea.  Endocrine: Negative.   Genitourinary: Negative.   Musculoskeletal: Negative.   Skin: Negative.   Allergic/Immunologic: Negative.   Neurological: Negative for dizziness, syncope, weakness, numbness and headaches.  Hematological: Negative.   Psychiatric/Behavioral: Negative.       Allergies  Codeine  Home Medications   Prior to Admission medications   Medication Sig Start Date End Date Taking? Authorizing Provider  amLODipine (NORVASC) 10 MG tablet Take 10 mg by mouth daily.   Yes Historical Provider, MD  Ascorbic Acid (VITAMIN C PO) Take 1 tablet by mouth daily.   Yes Historical Provider, MD  carbamazepine (TEGRETOL) 200 MG tablet Take 400 mg by mouth 2 (two) times daily.   Yes Historical Provider, MD  Cholecalciferol (VITAMIN D-3) 1000 UNITS CAPS Take 1,000 Units by mouth daily at 12  noon.    Yes Historical Provider, MD  ciprofloxacin (CIPRO) 500 MG tablet Take 500 mg by mouth every 12 (twelve) hours. 3 day supply, not yet started 12/09/14  Yes Historical Provider, MD  citalopram (CELEXA) 20 MG tablet Take 20 mg by mouth daily.   Yes Historical Provider, MD  clobetasol cream (TEMOVATE) [id number] % Apply 1 application topically 2 (two) times daily as needed (for legs).    Yes Historical Provider, MD  cloNIDine (CATAPRES) 0.2 MG tablet Take 0.2 mg by mouth 2 (two) times daily.   Yes  Historical Provider, MD  ezetimibe-simvastatin (VYTORIN) 10-40 MG per tablet Take 1 tablet by mouth daily.   Yes Historical Provider, MD  furosemide (LASIX) 80 MG tablet Take 40 mg by mouth daily.  10/02/12  Yes Nita Sells, MD  glipiZIDE (GLUCOTROL XL) 5 MG 24 hr tablet Take 5 mg by mouth daily with breakfast.   Yes Historical Provider, MD  HUMULIN 70/30 KWIKPEN (70-30) 100 UNIT/ML PEN Inject 20 Units into the skin 2 (two) times daily. 10/18/14  Yes Historical Provider, MD  hydrALAZINE (APRESOLINE) 25 MG tablet Take 25 mg by mouth 2 (two) times daily. 10/07/14  Yes Historical Provider, MD  Lacosamide (VIMPAT) 100 MG TABS Take 1 tablet by mouth 2 (two) times daily.   Yes Historical Provider, MD  metoprolol succinate (TOPROL-XL) 50 MG 24 hr tablet Take 50 mg by mouth 2 (two) times daily. Take with or immediately following a meal.   Yes Historical Provider, MD  omeprazole (PRILOSEC) 40 MG capsule Take 40 mg by mouth daily.   Yes Historical Provider, MD  diclofenac sodium (VOLTAREN) 1 % GEL Apply 2 g topically 4 (four) times daily. Rub into affected area of foot 2 to 4 times daily Patient not taking: Reported on 11/22/2014 01/09/14   Viona Gilmore [place], DPM   BP 155/46 mmHg  Pulse 47  Temp(Src) 98.1 F (36.7 C) (Oral)  Resp 17  SpO2 88% Physical Exam  Constitutional: She is oriented to person, place, and time. She appears well-developed and well-nourished. No distress.  HENT:  Head: Normocephalic and atraumatic.  Eyes: EOM are normal. Pupils are equal, round, and reactive to light.  Neck: Normal range of motion. Neck supple.  Cardiovascular: Regular rhythm and normal heart sounds.  Bradycardia present.   Pulmonary/Chest: Effort normal and breath sounds normal. No respiratory distress. She has no wheezes.  Abdominal: Soft. Bowel sounds are normal. She exhibits no distension. There is no tenderness. There is no rebound and no guarding.  Musculoskeletal: Normal range of motion. She exhibits  no edema.  Neurological: She is alert and oriented to person, place, and time. No cranial nerve deficit.  Skin: Skin is warm and dry. No erythema.  Psychiatric: She has a normal mood and affect. Her behavior is normal.  Nursing note and vitals reviewed.   ED Course  Procedures (including critical care time) Labs Review Labs Reviewed  CBC WITH DIFFERENTIAL/PLATELET - Abnormal; Notable for the following:    RBC 3.38 (*)    Hemoglobin 10.2 (*)    HCT 30.4 (*)    All other components within normal limits  TROPONIN I - Abnormal; Notable for the following:    Troponin I 0.28 (*)    All other components within normal limits  COMPREHENSIVE METABOLIC PANEL - Abnormal; Notable for the following:    Glucose, Bld 202 (*)    BUN 58 (*)    Creatinine, Ser 4.02 (*)    Calcium 8.6 (*)  GFR calc non Af Amer 11 (*)    GFR calc Af Amer 12 (*)    All other components within normal limits  URINALYSIS, ROUTINE W REFLEX MICROSCOPIC (NOT AT [facility]) - Abnormal; Notable for the following:    APPearance CLOUDY (*)    Protein, ur 30 (*)    Leukocytes, UA SMALL (*)    All other components within normal limits  URINE MICROSCOPIC-ADD ON - Abnormal; Notable for the following:    Squamous Epithelial / LPF 0-5 (*)    Casts HYALINE CASTS (*)    All other components within normal limits  BRAIN NATRIURETIC PEPTIDE    Imaging Review Dg Chest 2 View  12/11/2014  CLINICAL DATA:  Bradycardia and vomiting since yesterday. EXAM: CHEST  2 VIEW COMPARISON:  None. FINDINGS: Enlarged cardiac silhouette similar to prior. Exam is lordotic which exaggerates the cardiac silhouette. Interval increase in central venous congestion. Low lung volumes. No pleural fluid. IMPRESSION: Cardiomegaly and increased central venous congestion. Electronically Signed   By: Suzy Bouchard M.D.   On: 12/11/2014 09:31   I have personally reviewed and evaluated these images and lab results as part of my medical decision-making.   EKG  Interpretation   Date/Time:  Wednesday December 11 2014 08:18:38 EST Ventricular Rate:  39 PR Interval:  90 QRS Duration: 88 QT Interval:  627 QTC Calculation: 505 R Axis:   49 Text Interpretation:  Sinus bradycardia ? poor data quality Short PR  interval Minimal ST elevation, inferior leads Prolonged QT interval Since  last tracing rate slower Confirmed by KNAPP  MD-J, JON [id number]) on  12/11/2014 8:42:36 AM      MDM   Final diagnoses:  Bradycardia  Hypoxia  Acute on chronic kidney failure [facility])   8:35 AM Patient is a 69 year old female with history of DM, HTN, HLD, CHF, and seizures presenting with 4 episodes of emesis and asymptomatic bradycardia. Physical exam benign. Differential includes gastroenteritis (though would expect associated diarrhea), DKA, and silent MI. EKg shows sinus bradycardia. Doubt obstruction given benign abdominal exam and BM yesterday. Will check CBC, CMP, UA, and troponin.   11:02 AM Labs significant for AoCKD and mild troponin elevation. CXR with increased vascular congestion. HR is improving (currently mid-40s), however patient continues to be hypoxic, satting upper 80s on room air. Troponin elevated to 0.28. Will give dose of IV lasix for volume overload and call hospitalist for admission.   Vivi Barrack, MD 12/11/14 [place], MD 12/13/14 [phone]

## 2014-12-11 NOTE — ED Notes (Signed)
Pt. Presents following 2 episodes of emesis this AM when waking up. Pt. States she had one episode of emesis yesterday. Pt. Denies nausea or pain prior to episodes.EMS notes pt. To have junctional bradycardia with HR 38. Pt. Has not taken daily meds, CBG 240.

## 2014-12-11 NOTE — Consult Note (Signed)
CARDIOLOGY CONSULT NOTE   Patient ID: [name] MRN: [id number], DOB/AGE: 05/28/1945   Admit date: 12/11/2014 Date of Consult: 12/11/2014   Primary Physician: Benito Mccreedy, MD Primary Cardiologist: new  Pt. Profile  69 y/o female w/o prior cardiac hx who presented to the ED this AM with a one day h/o n/v and was found to be bradycardic and hypertensive.  Problem List  Past Medical History  Diagnosis Date  . Diabetes mellitus   . Seizures ([place])   . Essential hypertension   . CKD (chronic kidney disease), stage IV ([place])   . Hyperlipidemia   . GERD (gastroesophageal reflux disease)   . Morbid obesity ([place])   . Depression   . CHF (congestive heart failure) [facility])     Past Surgical History  Procedure Laterality Date  . No surgical history      Allergies  Allergies  Allergen Reactions  . Codeine Nausea And Vomiting   HPI   69 y/o female with a h/o difficult to control HTN, HL, morbid obesity, DM II, and CKD III-IV.  She does not have a prior cardiac history.  She lives by herself in [place] and her activity is severely limited.  She admits to a very sedentary lifestyle and experiences DOE with minimal activity and has done so for years.  She has never had c/p.  She has had BP issues for several years and is currently Rx 5 different BP meds.  She takes 4 of those, noting that she doesn't like to take lasix b/c it makes her run to the bathroom.  On 10/20, she presented to the ED 2/2 presyncope.  She was noted to be bradycardic and her creat was up to 2.53.  She was advised to hold her lasix but it sounds like she wasn't taking it anyway.  Since then, she has been felling reasonably well, except for DOE, and notes that her BP is up above 200 just about every other day.  She has an aide that comes out to her house and who regularly recommends that she present to the ED for eval 2/2 marked HTN.  On 11/15, she developed nausea and dry-heaving/vomiting with small volume emesis of  frothy-yellow contents.  She did not have any abdominal discomfort, fever, cp, dyspnea, or presyncope.  Nausea persisted throughout the day and then this AM she had more vomiting/dry-heaving episodes.  She called EMS and was found to be bradycardic.  She was taken to the [facility] ED where initial ECG showed jxnl bradycardia @ 39 (she did not take AM meds today).  T waves were peaked though K was nl @ 4.5.  Creat was more elevated @ 4.02.  BNP 1398.  Trop 0.28.  CXR showed CM and increased vascular congestion.  She was treated with IV lasix and admitted for further eval.  She currently has no complaints.  She remains hypertensive, SBP > 200 and bradycardic - now sinus brady in the high 40's.    Home Medications  Prior to Admission medications   Medication Sig Start Date End Date Taking? Authorizing Provider  amLODipine (NORVASC) 10 MG tablet Take 10 mg by mouth daily.   Yes Historical Provider, MD  Ascorbic Acid (VITAMIN C PO) Take 1 tablet by mouth daily.   Yes Historical Provider, MD  carbamazepine (TEGRETOL) 200 MG tablet Take 400 mg by mouth 2 (two) times daily.   Yes Historical Provider, MD  Cholecalciferol (VITAMIN D-3) 1000 UNITS CAPS Take 1,000 Units by mouth daily at  12 noon.    Yes Historical Provider, MD  ciprofloxacin (CIPRO) 500 MG tablet Take 500 mg by mouth every 12 (twelve) hours. 3 day supply, not yet started 12/09/14  Yes Historical Provider, MD  citalopram (CELEXA) 20 MG tablet Take 20 mg by mouth daily.   Yes Historical Provider, MD  clobetasol cream (TEMOVATE) [id number] % Apply 1 application topically 2 (two) times daily as needed (for legs).    Yes Historical Provider, MD  cloNIDine (CATAPRES) 0.2 MG tablet Take 0.2 mg by mouth 2 (two) times daily.   Yes Historical Provider, MD  ezetimibe-simvastatin (VYTORIN) 10-40 MG per tablet Take 1 tablet by mouth daily.   Yes Historical Provider, MD  furosemide (LASIX) 80 MG tablet Take 40 mg by mouth daily.  10/02/12  Yes Nita Sells, MD    glipiZIDE (GLUCOTROL XL) 5 MG 24 hr tablet Take 5 mg by mouth daily with breakfast.   Yes Historical Provider, MD  HUMULIN 70/30 KWIKPEN (70-30) 100 UNIT/ML PEN Inject 20 Units into the skin 2 (two) times daily. 10/18/14  Yes Historical Provider, MD  hydrALAZINE (APRESOLINE) 25 MG tablet Take 25 mg by mouth 2 (two) times daily. 10/07/14  Yes Historical Provider, MD  Lacosamide (VIMPAT) 100 MG TABS Take 1 tablet by mouth 2 (two) times daily.   Yes Historical Provider, MD  metoprolol succinate (TOPROL-XL) 50 MG 24 hr tablet Take 50 mg by mouth 2 (two) times daily. Take with or immediately following a meal.   Yes Historical Provider, MD  omeprazole (PRILOSEC) 40 MG capsule Take 40 mg by mouth daily.   Yes Historical Provider, MD     Family History Family History  Problem Relation Age of Onset  . Hypertension Mother     died @ 44 - unknown cause.  [name] Hypertension Father     says she doesn't know anything about her father.  . Cancer Maternal Aunt   . Seizures Neg Hx      Social History Social History   Social History  . Marital Status: Divorced    Spouse Name: N/A  . Number of Children: 2  . Years of Education: 10   Occupational History  . Retired    Social History Main Topics  . Smoking status: Never Smoker   . Smokeless tobacco: Never Used     Comment: EXPOSED TO 2ND HAND SMOKE X 20+ YEARS.   [name] Alcohol Use: No  . Drug Use: No  . Sexual Activity: No   Other Topics Concern  . Not on file   Social History Narrative   Lives in [place] by herself.  Daughters nearby.  Sedentary.   Eats fast food for at least one meal almost daily.   Caffeine use:  Drinks soda daily.   Coffee rarely.      Review of Systems  General:  No chills, fever, night sweats or weight changes.  Cardiovascular:  No chest pain, +++ dyspnea on exertion, occasional edema, no orthopnea, palpitations, paroxysmal nocturnal dyspnea. Dermatological: No rash, lesions/masses Respiratory: No cough, +++  dyspnea Urologic: No hematuria, dysuria Abdominal:   +++ nausea, +++ vomiting, no diarrhea, bright red blood per rectum, melena, or hematemesis Neurologic:  No visual changes, wkns, changes in mental status. All other systems reviewed and are otherwise negative except as noted above.  Physical Exam  Blood pressure 218/72, pulse 46, temperature 98 F (36.7 C), temperature source Oral, resp. rate 15, weight 236 lb 11.2 oz (107.366 kg), SpO2 97 %.  General: Pleasant, NAD  Psych: Normal affect. Neuro: Alert and oriented X 3. Moves all extremities spontaneously. HEENT: Normal  Neck: Supple without bruits. Difficult to gauge JVP 2/2 girth. Lungs:  Resp regular and unlabored, diminished breath sounds throughout. Heart: RRR no s3, s4, or murmurs. Abdomen: Soft, non-tender, non-distended, BS + x 4.  Extremities: No clubbing, cyanosis or edema. DP/PT/Radials 2+ and equal bilaterally.  Labs   Recent Labs  12/11/14 0835  TROPONINI 0.28*   Lab Results  Component Value Date   WBC 7.1 12/11/2014   HGB 10.2* 12/11/2014   HCT 30.4* 12/11/2014   MCV 89.9 12/11/2014   PLT 197 12/11/2014     Recent Labs Lab 12/11/14 0835  NA 137  K 4.5  CL 103  CO2 24  BUN 58*  CREATININE 4.02*  CALCIUM 8.6*  PROT 6.7  BILITOT 0.7  ALKPHOS 95  ALT 15  AST 20  GLUCOSE 202*   Lab Results  Component Value Date   CHOL 139 04/03/2010   HDL 52 04/03/2010   LDLCALC 69 04/03/2010   TRIG 92 04/03/2010   Radiology/Studies  Dg Chest 2 View  12/11/2014  CLINICAL DATA:  Bradycardia and vomiting since yesterday. EXAM: CHEST  2 VIEW COMPARISON:  None. FINDINGS: Enlarged cardiac silhouette similar to prior. Exam is lordotic which exaggerates the cardiac silhouette. Interval increase in central venous congestion. Low lung volumes. No pleural fluid. IMPRESSION: Cardiomegaly and increased central venous congestion. Electronically Signed   By: Suzy Bouchard M.D.   On: 12/11/2014 09:31   ECG  Junctional  bradycardia, 39, no acute st changes.  Peaked T's. Prolonged QT. Sinus bradycardia, 48, no acute st/t changes.  Prolonged QT.  ASSESSMENT AND PLAN  1.  Sinus bradycardia/Junctional Bradycardia:  Pt presented today with a 1 day h/o nausea and vomiting and has been found to be bradycardic, initially jxnl followed by sinus brady.  She has a h/o sinus brady dating back to at least 10/20, when she was seen in the ED with presyncope and was noted to be bradycardic.  No change was made to meds @ that time.  She has been on toprol 50 daily and clonidine 0.2 bid.  Recommend holding BB.  Follow HR.  Check echo, tsh.  May need to wean clonidine if HR does not significantly improve.  2.  Essential HTN: BP elevated in ED despite multiple meds including bb, clonidine, hydralazine, amlodipine, and lasix.  She has not been using lasix and does have some evidence of volume overload in the setting of acute on chronic renal failure.  Lasix ordered by IM.  May need nephrology consultation if not improvement in renal fxn.  Hold bb as above.    3.  Acute on chronic renal failure, stage IV-V:  In setting of marked hypertension.  Follow with diuresis.  Consider renal eval.  4.  Elevated troponin:  Trop 0.28 initially in setting of AKI.  Follow.  No c/p.  Not clear if N/V an anginal equiv.  Unlikely as ecg is w/o acute st/t changes.  Not an ideal candidate for ischemic eval.  Body habitus will limit nuclear imaging while A on CKD prevents cath.  Will add asa for now.  Cont statin.  5.  DM II:  Per IM.  6.  Nausea and vomiting:  Etiology unclear @ this point.  Bradycardia unlikely to be playing a role as she is otw hypertensive.  ? Low output in setting of prolonged and poorly managed HTN - echo pending.  7.  Morbid Obesity:  She needs nutritional counseling.  She eats fast food almost daily despite being on 4-5 different antihypertensives.  She is completely sedentary.  8.  HL:  Cont vytorin.  Signed, Murray Hodgkins, NP 12/11/2014, 1:20 PM   I have personally seen and examined this patient with Ignacia Bayley, NP. I agree with the assessment and plan as outlined above. She is admitted with worsened renal failure, volume overload and bradycardia/junctional bradycardia. Agree with holding beta blocker and follow on telemetry. Echo and TSH. She has chronically elevated troponin. Follow trend. No cath candidate with renal insufficiency. We will follow with you.   MCALHANY,CHRISTOPHER 12/11/2014 2:16 PM

## 2014-12-11 NOTE — Progress Notes (Signed)
  Echocardiogram 2D Echocardiogram has been performed.  Bobbye Charleston 12/11/2014, 3:14 PM

## 2014-12-11 NOTE — Progress Notes (Signed)
RN notified this NP of ? Rhythm change on tele and elevated troponin. Pt here with bradycardia and offending meds have been discontinued. Pt seen by cardio for this and elevated troponin today (thought to be from renal failure). See consult. Pt is asymptomatic, no dizziness or CP.  EKG didn't convert into EPIC so NP to floor to review.  12 lead shows bradycardia with HR 40s. Tele shows sinus brady with non sustained changes to AVB or junctional rhythm at times. HR 45-50 on tele. Atropine is already at bedside. Will continue to monitor pt. Discussed with RN, Anderson Malta, that if pt sustains HR less than 40 or if pt has dizziness, CP or other sx to call immediately. BP is lower, placed parameters to hold on Hydralazine. Pt off BB and Clonidine.  Will follow.  Clance Boll, NP [facility]

## 2014-12-11 NOTE — ED Notes (Signed)
Patient moved over on stretcher

## 2014-12-11 NOTE — H&P (Signed)
[facility] History and Physical  [name] [id number] DOB: Aug 19, 1945 DOA: 12/11/2014  Referring physician: Dr. Dimas Chyle  PCP: Benito Mccreedy, MD   Chief Complaint: Nausea and vomiting   HPI: [name] is a 69 y.o. female PMH of CHF, CKD, DM, HTN, seizures and morbid obesity who presented to the ED today via EMS with nausea and vomiting x 1 day. She reports two episodes of emesis yesterday morning and two episodes this morning, both of which were nonbloody, non-bilious and yellow in color. She denies abdominal pain and diarrhea. En route to the ED she was found to be bradycardic into the 30s which has slowly improved into the 40s-50s. Work-up in the ED significant for acute on chronic renal failure with creatinine 4.02 today, up from 2.5 on 11/14/2014 and vascular congestion on CXR. She also had a mild increase in troponin of 0.28. Cardiology was consulted and the patient was admitted for further evaluation.    Review of Systems:  Constitutional:  No weight loss, night sweats, Fevers, chills, fatigue.  HEENT:  No headaches, Difficulty swallowing,Tooth/dental problems,Sore throat,  No sneezing, itching, ear ache, nasal congestion, post nasal drip,  Cardio-vascular:  She does report orthopnea. No chest pain, PND, swelling in lower extremities, anasarca, dizziness, palpitations.  GI:  She does report nausea, vomiting. No heartburn, indigestion, abdominal pain,diarrhea, change in bowel habits, loss of appetite  Resp:  She does report SOB with exertion. No shortness of breath at rest. No excess mucus, no productive cough, No non-productive cough, No coughing up of blood.No change in color of mucus.No wheezing.No chest wall deformity  Skin:  no rash or lesions.  GU:  no dysuria, change in color of urine, no urgency or frequency. No flank pain.  Musculoskeletal:  No joint pain or swelling. No decreased range of motion. No back pain.  Psych:  No change in mood  or affect. No depression or anxiety. No memory loss.   Past Medical History  Diagnosis Date  . Diabetes mellitus   . Seizures ([place])   . Hypertension   . Chronic kidney disease   . Hyperlipidemia   . GERD (gastroesophageal reflux disease)   . Morbid obesity ([place])   . Depression   . CHF (congestive heart failure) [facility])    Past Surgical History  Procedure Laterality Date  . No surgical history     Social History:  reports that she has never smoked. She has never used smokeless tobacco. She reports that she does not drink alcohol or use illicit drugs.  Allergies  Allergen Reactions  . Codeine Nausea And Vomiting    Family History  Problem Relation Age of Onset  . Hypertension Mother   . Hypertension Father   . Cancer Maternal Aunt   . Seizures Neg Hx     Prior to Admission medications   Medication Sig Start Date End Date Taking? Authorizing Provider  amLODipine (NORVASC) 10 MG tablet Take 10 mg by mouth daily.   Yes Historical Provider, MD  Ascorbic Acid (VITAMIN C PO) Take 1 tablet by mouth daily.   Yes Historical Provider, MD  carbamazepine (TEGRETOL) 200 MG tablet Take 400 mg by mouth 2 (two) times daily.   Yes Historical Provider, MD  Cholecalciferol (VITAMIN D-3) 1000 UNITS CAPS Take 1,000 Units by mouth daily at 12 noon.    Yes Historical Provider, MD  ciprofloxacin (CIPRO) 500 MG tablet Take 500 mg by mouth every 12 (twelve) hours. 3 day supply, not yet started 12/09/14  Yes Historical Provider, MD  citalopram (CELEXA) 20 MG tablet Take 20 mg by mouth daily.   Yes Historical Provider, MD  clobetasol cream (TEMOVATE) [id number] % Apply 1 application topically 2 (two) times daily as needed (for legs).    Yes Historical Provider, MD  cloNIDine (CATAPRES) 0.2 MG tablet Take 0.2 mg by mouth 2 (two) times daily.   Yes Historical Provider, MD  ezetimibe-simvastatin (VYTORIN) 10-40 MG per tablet Take 1 tablet by mouth daily.   Yes Historical Provider, MD  furosemide (LASIX) 80 MG  tablet Take 40 mg by mouth daily.  10/02/12  Yes Nita Sells, MD  glipiZIDE (GLUCOTROL XL) 5 MG 24 hr tablet Take 5 mg by mouth daily with breakfast.   Yes Historical Provider, MD  HUMULIN 70/30 KWIKPEN (70-30) 100 UNIT/ML PEN Inject 20 Units into the skin 2 (two) times daily. 10/18/14  Yes Historical Provider, MD  hydrALAZINE (APRESOLINE) 25 MG tablet Take 25 mg by mouth 2 (two) times daily. 10/07/14  Yes Historical Provider, MD  Lacosamide (VIMPAT) 100 MG TABS Take 1 tablet by mouth 2 (two) times daily.   Yes Historical Provider, MD  metoprolol succinate (TOPROL-XL) 50 MG 24 hr tablet Take 50 mg by mouth 2 (two) times daily. Take with or immediately following a meal.   Yes Historical Provider, MD  omeprazole (PRILOSEC) 40 MG capsule Take 40 mg by mouth daily.   Yes Historical Provider, MD  diclofenac sodium (VOLTAREN) 1 % GEL Apply 2 g topically 4 (four) times daily. Rub into affected area of foot 2 to 4 times daily Patient not taking: Reported on 11/22/2014 01/09/14   Bronson Ing, DPM   Physical Exam: Filed Vitals:   12/11/14 1100 12/11/14 1130 12/11/14 1143 12/11/14 1150  BP: 155/46 153/47 171/48   Pulse: 47 48 49   Temp:   98.3 F (36.8 C)   TempSrc:   Oral   Resp: 17 16 18    SpO2: 88% 88%  93%    Wt Readings from Last 3 Encounters:  11/22/14 109.68 kg (241 lb 12.8 oz)  07/24/14 113.445 kg (250 lb 1.6 oz)  11/14/12 104.327 kg (230 lb)    General:  Pleasant female, sitting upright in bed. Speaks in short sentences with some apparent SOB, no acute distress.  Eyes: PERRL, normal lids, irises & conjunctiva ENT: grossly normal hearing, lips & tongue Neck: no LAD, masses or thyromegaly Cardiovascular: RRR, no m/r/g. No LE edema. Telemetry: Sinus bradycardia. Respiratory: CTA bilaterally, no w/r/r. Normal respiratory effort. Abdomen: soft, non-tender, bowel sounds present all 4 quadrants. (-) Murphy's sign.  Skin: no rash or induration seen on limited  exam Musculoskeletal: grossly normal tone BUE/BLE. Chronic venous stasis changes to bilateral lower extremities.   Psychiatric: grossly normal mood and affect, speech fluent and appropriate Neurologic: grossly non-focal.          Labs on Admission:  Basic Metabolic Panel:  Recent Labs Lab 12/11/14 0835  NA 137  K 4.5  CL 103  CO2 24  GLUCOSE 202*  BUN 58*  CREATININE 4.02*  CALCIUM 8.6*   Liver Function Tests:  Recent Labs Lab 12/11/14 0835  AST 20  ALT 15  ALKPHOS 95  BILITOT 0.7  PROT 6.7  ALBUMIN 3.5   No results for input(s): LIPASE, AMYLASE in the last 168 hours. No results for input(s): AMMONIA in the last 168 hours. CBC:  Recent Labs Lab 12/11/14 0835  WBC 7.1  NEUTROABS 5.4  HGB 10.2*  HCT 30.4*  MCV 89.9  PLT 197   Cardiac Enzymes:  Recent Labs Lab 12/11/14 0835  TROPONINI 0.28*    BNP (last 3 results) No results for input(s): BNP in the last 8760 hours.  ProBNP (last 3 results) No results for input(s): PROBNP in the last 8760 hours.  CBG: No results for input(s): GLUCAP in the last 168 hours.  Radiological Exams on Admission: Dg Chest 2 View  12/11/2014  CLINICAL DATA:  Bradycardia and vomiting since yesterday. EXAM: CHEST  2 VIEW COMPARISON:  None. FINDINGS: Enlarged cardiac silhouette similar to prior. Exam is lordotic which exaggerates the cardiac silhouette. Interval increase in central venous congestion. Low lung volumes. No pleural fluid. IMPRESSION: Cardiomegaly and increased central venous congestion. Electronically Signed   By: Suzy Bouchard M.D.   On: 12/11/2014 09:31    EKG: I personally reviewed the EKG. Sinus bradycardia with first degree block, rate of 48. No ST elevation or depression.   Assessment/Plan Active Problems:   Morbid obesity (HCC)   HYPERTENSION, BENIGN   Seizure (HCC)   OSA (obstructive sleep apnea)   Bradycardia  Nausea/Vomiting  - Patient presented to the ED with one day history of several  episodes of vomiting - Etiology remains unclear - ? Related to gastroenteritis vs due to bradycardia - with elevated troponin some underlying concern for anginal equivalent. Cardiology following.  Bradycardia with elevated troponin - En route to the ED patient was noted to be bradycardic into the 30s, improving somewhat into the 40s-50s - Review of the chart indicates this may be ongoing problem, patient was here on 10/20 with bradycardia and pre-syncope  - likely due to Metoprolol and Clonidine, cardiology following - cycle cardiac enzymes.   Acute on Chronic Renal Failure  - Work-up in the ED revealed acutely elevated creatinine of 4.02 compared to 2.53 11/14/14, unclear trigger for acute increase. She is not dehydrated and in fact appears mildly fluid overloaded. Received IV Lasix in the ED, closely watch her renal function in am. - hold po Lasix for now until will see her Cr in the morning  Acute on Chronic CHF, NOS - CXR performed in the ED with vascular congestion likely related to volume overload, true CHF related vs decreased UOP in the setting of #1. Monitor strict I&Os, obtain 2D echo - Patient does admit to not being compliant with lasix at home  - BNP elevated at 1398  Hypoxic respiratory failure - O2 saturations in the ED 88-92% on R with some mild SOB  - Unsure of patient's baseline, may be related to hypoventilation due to obesity vs CHF exacerbation  - Lasix as above  HTN  - BP remains elevated in ED 150-160s despite several antihypertensives including Metoprolol, clonidine, hydralazine, amlodipine  - monitor, asymptomatic  Diabetes Mellitus  - Hold Glipizide and start patient on Lantus and sliding scale Novolog  - Hemoglobin A1C pending  Seizures  - Patient with history of seizures, no focal neurologic deficits or symptoms currently  - Will continue carbamazepine and lacosamide   OSA - She states she has had a sleep study and was told she doesn't need to wear  CPAP   GERD - Stable, continue Protonix    Code Status: Full Code  DVT Prophylaxis: Heparin Family Communication: no family bedside Disposition Plan: TBD  Time spent: 35 minutes   Yazlin Ekblad M. Cruzita Lederer, MD [facility] [phone]

## 2014-12-11 NOTE — ED Notes (Signed)
Patient undressed, in gown, on monitor, continuous pulse oximetry and blood pressure cuff 

## 2014-12-11 NOTE — Progress Notes (Signed)
Baltazar Najjar, NP notified of rhythm change. Pt febrile 101.5, otherwise VSS. EKG obtained. Will continue to monitor.

## 2014-12-12 ENCOUNTER — Inpatient Hospital Stay (HOSPITAL_COMMUNITY): Payer: Medicare Other

## 2014-12-12 DIAGNOSIS — I10 Essential (primary) hypertension: Secondary | ICD-10-CM

## 2014-12-12 LAB — BASIC METABOLIC PANEL
ANION GAP: 12 (ref 5–15)
BUN: 60 mg/dL — ABNORMAL HIGH (ref 6–20)
CO2: 21 mmol/L — AB (ref 22–32)
Calcium: 8.3 mg/dL — ABNORMAL LOW (ref 8.9–10.3)
Chloride: 102 mmol/L (ref 101–111)
Creatinine, Ser: 3.66 mg/dL — ABNORMAL HIGH (ref 0.44–1.00)
GFR calc Af Amer: 14 mL/min — ABNORMAL LOW (ref >60)
GFR, EST NON AFRICAN AMERICAN: 12 mL/min — AB (ref >60)
GLUCOSE: 132 mg/dL — AB (ref 65–99)
POTASSIUM: 4.1 mmol/L (ref 3.5–5.1)
Sodium: 135 mmol/L (ref 135–145)

## 2014-12-12 LAB — CBC
HEMATOCRIT: 27.6 % — AB (ref 36.0–46.0)
HEMOGLOBIN: 9.6 g/dL — AB (ref 12.0–15.0)
MCH: 31.2 pg (ref 26.0–34.0)
MCHC: 34.8 g/dL (ref 30.0–36.0)
MCV: 89.6 fL (ref 78.0–100.0)
Platelets: 176 10*3/uL (ref 150–400)
RBC: 3.08 MIL/uL — ABNORMAL LOW (ref 3.87–5.11)
RDW: 12.5 % (ref 11.5–15.5)
WBC: 9.4 10*3/uL (ref 4.0–10.5)

## 2014-12-12 LAB — GLUCOSE, CAPILLARY
GLUCOSE-CAPILLARY: 163 mg/dL — AB (ref 65–99)
GLUCOSE-CAPILLARY: 116 mg/dL — AB (ref 65–99)
Glucose-Capillary: 135 mg/dL — ABNORMAL HIGH (ref 65–99)
Glucose-Capillary: 215 mg/dL — ABNORMAL HIGH (ref 65–99)
Glucose-Capillary: 166 mg/dL — ABNORMAL HIGH (ref 65–99)

## 2014-12-12 LAB — HEMOGLOBIN A1C
HEMOGLOBIN A1C: 6.4 % — AB (ref 4.8–5.6)
Mean Plasma Glucose: 137 mg/dL

## 2014-12-12 LAB — TROPONIN I: Troponin I: 0.49 ng/mL — ABNORMAL HIGH (ref <0.031)

## 2014-12-12 LAB — BRAIN NATRIURETIC PEPTIDE: B Natriuretic Peptide: 2226.5 pg/mL — ABNORMAL HIGH (ref 0.0–100.0)

## 2014-12-12 MED ORDER — SODIUM CHLORIDE 0.45 % IV SOLN
INTRAVENOUS | Status: DC
  Administered 2014-12-12: 18:00:00 via INTRAVENOUS

## 2014-12-12 MED ORDER — CEFTRIAXONE SODIUM 1 G IJ SOLR
1.0000 g | INTRAMUSCULAR | Status: DC
  Administered 2014-12-12 – 2014-12-16 (×4): 1 g via INTRAVENOUS
  Filled 2014-12-12 (×6): qty 10

## 2014-12-12 MED ORDER — POLYETHYLENE GLYCOL 3350 17 G PO PACK
17.0000 g | PACK | Freq: Two times a day (BID) | ORAL | Status: DC
  Administered 2014-12-12 – 2014-12-14 (×2): 17 g via ORAL
  Filled 2014-12-12 (×2): qty 1

## 2014-12-12 MED ORDER — BISACODYL 10 MG RE SUPP
10.0000 mg | Freq: Every day | RECTAL | Status: DC
  Administered 2014-12-12: 10 mg via RECTAL
  Filled 2014-12-12 (×2): qty 1

## 2014-12-12 MED ORDER — ALBUTEROL SULFATE (2.5 MG/3ML) 0.083% IN NEBU
2.5000 mg | INHALATION_SOLUTION | RESPIRATORY_TRACT | Status: DC | PRN
Start: 2014-12-12 — End: 2014-12-17

## 2014-12-12 MED ORDER — DOCUSATE SODIUM 100 MG PO CAPS
200.0000 mg | ORAL_CAPSULE | Freq: Two times a day (BID) | ORAL | Status: DC
  Administered 2014-12-12 – 2014-12-20 (×13): 200 mg via ORAL
  Filled 2014-12-12 (×15): qty 2

## 2014-12-12 NOTE — Plan of Care (Signed)
Problem: Physical Regulation: Goal: Ability to maintain clinical measurements within normal limits will improve Outcome: Progressing Pt heartrate has slowly increased since discontinuation of select anti-hypertensives.

## 2014-12-12 NOTE — Progress Notes (Signed)
Patient Name: [name] Date of Encounter: 12/12/2014  Active Problems:   Diabetes mellitus ([place])   Hyperlipidemia   Morbid obesity ([place])   HYPERTENSION, BENIGN   Seizure ([place])   CKD (chronic kidney disease) stage 3, GFR 30-59 ml/min   Elevated troponin   OSA (obstructive sleep apnea)   Bradycardia   Acute renal failure superimposed on stage 3 chronic kidney disease (HCC)   Vomiting   CKD (chronic kidney disease), stage IV ([place])   Essential hypertension   Type II diabetes mellitus ([place])   Acute renal failure [facility])   Primary Cardiologist: Dr Angelena Form  Patient Profile: 69 yo female w/ HTN, DM, Sz, CKD IV, GERD, D-CHF, admitted 11/16 w/ bradycardia and hypotension. BB on hold.  SUBJECTIVE: No chest pain, no SOB, no palpitations. Not light-headed but has not been out of bed.  OBJECTIVE Filed Vitals:   12/12/14 0437 12/12/14 0628 12/12/14 0732 12/12/14 0744  BP:  144/51 119/52 116/42  Pulse: 59 44 62 63  Temp: 99.7 F (37.6 C)  99.4 F (37.4 C)   TempSrc: Axillary  Oral   Resp: 28 22 18 26   Height:      Weight:      SpO2: 98% 98% 97% 98%    Intake/Output Summary (Last 24 hours) at 12/12/14 0834 Last data filed at 12/12/14 0200  Gross per 24 hour  Intake    800 ml  Output    775 ml  Net     25 ml   Filed Weights   12/11/14 1226 12/12/14 0400  Weight: 236 lb 11.2 oz (107.366 kg) 239 lb (108.41 kg)    PHYSICAL EXAM General: Well developed, well nourished, female in no acute distress. Head: Normocephalic, atraumatic.  Neck: Supple without bruits, JVD not elevated, but difficult to assess 2nd body habitus. Lungs:  Resp regular and unlabored, decreased BS bases. Heart: RRR, S1, S2, no S3, S4, or murmur; no rub. Abdomen: Soft, non-tender, non-distended, BS + x 4.  Extremities: No clubbing, cyanosis, trace edema.  Neuro: Alert and oriented X 3. Moves all extremities spontaneously. Psych: Normal affect.  LABS: CBC: Recent Labs  12/11/14 0835  12/11/14 1305 12/12/14 0042  WBC 7.1 7.3 9.4  NEUTROABS 5.4  --   --   HGB 10.2* 10.4* 9.6*  HCT 30.4* 30.6* 27.6*  MCV 89.9 89.5 89.6  PLT 197 202 0000000   Basic Metabolic Panel: Recent Labs  12/11/14 0835 12/11/14 1305 12/12/14 0042  NA 137  --  135  K 4.5  --  4.1  CL 103  --  102  CO2 24  --  21*  GLUCOSE 202*  --  132*  BUN 58*  --  60*  CREATININE 4.02* 3.74* 3.66*  CALCIUM 8.6*  --  8.3*   Liver Function Tests: Recent Labs  12/11/14 0835  AST 20  ALT 15  ALKPHOS 95  BILITOT 0.7  PROT 6.7  ALBUMIN 3.5   Cardiac Enzymes: Recent Labs  12/11/14 1305 12/11/14 1905 12/12/14 0042  TROPONINI 0.49* 0.57* 0.49*   BNP:  B NATRIURETIC PEPTIDE  Date/Time Value Ref Range Status  12/11/2014 11:29 AM 1398.1* 0.0 - 100.0 pg/mL Final   Hemoglobin A1C: Recent Labs  12/11/14 1305  HGBA1C 6.4*   Thyroid Function Tests: Recent Labs  12/11/14 1305  TSH 1.000    TELE:  Junctional>>sinus brady      ECHO: 12/11/2014 - Left ventricle: The cavity size was normal. Wall thickness was increased  in a pattern of moderate LVH. Systolic function was normal. The estimated ejection fraction was in the range of 55% to 60%. Wall motion was normal; there were no regional wall motion abnormalities. - Aortic valve: Valve area (VTI): 1.97 cm^2. Valve area (Vmax): 1.77 cm^2. Valve area (Vmean): 2.09 cm^2. - Left atrium: The atrium was mildly dilated.  Radiology/Studies: Dg Chest 2 View  12/11/2014  CLINICAL DATA:  Bradycardia and vomiting since yesterday. EXAM: CHEST  2 VIEW COMPARISON:  None. FINDINGS: Enlarged cardiac silhouette similar to prior. Exam is lordotic which exaggerates the cardiac silhouette. Interval increase in central venous congestion. Low lung volumes. No pleural fluid. IMPRESSION: Cardiomegaly and increased central venous congestion. Electronically Signed   By: Suzy Bouchard M.D.   On: 12/11/2014 09:31   Dg Chest Port 1 View  12/12/2014   CLINICAL DATA:  Shortness of breath for 2 days EXAM: PORTABLE CHEST 1 VIEW COMPARISON:  12/11/2014 FINDINGS: Cardiomegaly again noted. Mild thoracic dextroscoliosis. Degenerative changes thoracic spine. Central mild vascular congestion without pulmonary edema. No focal infiltrate IMPRESSION: Cardiomegaly. Mild thoracic dextroscoliosis. Central vascular congestion without convincing pulmonary edema. No segmental infiltrate. Electronically Signed   By: Lahoma Crocker M.D.   On: 12/12/2014 08:00     Current Medications:  . amLODipine  10 mg Oral Daily  . antiseptic oral rinse  7 mL Mouth Rinse BID  . aspirin  81 mg Oral Daily  . carbamazepine  400 mg Oral BID  . cefTRIAXone (ROCEPHIN)  IV  1 g Intravenous Q24H  . citalopram  20 mg Oral Daily  . docusate sodium  100 mg Oral BID  . ezetimibe-simvastatin  1 tablet Oral QHS  . fluconazole  100 mg Oral Daily  . heparin  5,000 Units Subcutaneous 3 times per day  . hydrALAZINE  50 mg Oral 3 times per day  . Influenza vac split quadrivalent PF  0.5 mL Intramuscular Tomorrow-1000  . insulin aspart  0-15 Units Subcutaneous TID WC  . insulin aspart  4 Units Subcutaneous TID WC  . insulin glargine  15 Units Subcutaneous QHS  . lacosamide  100 mg Oral BID  . pantoprazole  40 mg Oral Daily  . pneumococcal 23 valent vaccine  0.5 mL Intramuscular Tomorrow-1000      ASSESSMENT AND PLAN: 1. Sinus bradycardia/Junctional Bradycardia: Pt presented today with a 1 day h/o nausea and vomiting and has been found to be bradycardic, initially jxnl followed by sinus brady. She has a h/o sinus brady dating back to at least 10/20, when she was seen in the ED with presyncope and was noted to be bradycardic. No change was made to meds @ that time. She was on Toprol XL 50 daily and clonidine 0.2 bid. Now off both, HR improved. TSH nl. Echo w/ nl EF  2. Essential HTN: BP elevated in ED despite multiple meds including BB, clonidine, hydralazine, amlodipine, and lasix. BP  has improved on current rx, raising concerns about medication compliance.     She has not been using lasix and does have some evidence of volume overload in the setting of acute on chronic renal failure. Lasix ordered by IM. May need nephrology consultation if not improvement in renal fxn. Hold BB as above.   3. Acute on chronic renal failure, stage IV-V: In setting of marked hypertension. Follow. Consider renal eval. Per IM.   4. Elevated troponin: Trop 0.28 initially in setting of AKI. Other troponins mildly elevated. No c/p. Not clear if N/V an anginal equiv.  Unlikely as ECG is w/o acute st/t changes. Not an ideal candidate for ischemic eval. Body habitus will limit nuclear imaging while CKD prevents cath. On ASA. Cont statin. No BB w/ bradycardia. EF nl w/ no WMA on echo. Reassuring.  5. DM II: Per IM.  6. Nausea and vomiting: Etiology unclear @ this point. Bradycardia unlikely to be playing a role as she is otw hypertensive. ? Low output in setting of prolonged and poorly managed HTN - EF nl on echo w/ no WMA  7. Morbid Obesity: She needs nutritional counseling. She eats fast food almost daily despite being on 4-5 different antihypertensives. She is completely sedentary. Per IM.  Signed, Lenoard Aden 8:34 AM 12/12/2014   I have personally seen and examined this patient with Rosaria Ferries, PA-C. I agree with the assessment and plan as outlined above. HR is much better today off of metoprolol and clonidine. BP is also surprisingly well controlled on current therapy. Question compliance at home. Echo with normal LV function. Would not restart beta blocker or clonidine. We will sign off. Please call with questions.   Jalisha Enneking 12/12/2014 9:06 AM

## 2014-12-12 NOTE — Progress Notes (Signed)
Paula Najjar, NP notified of 7.34 pause and new onset confusion. Pt unable to clearly state time/date & situation. Will continue to monitor closely.

## 2014-12-12 NOTE — Evaluation (Signed)
Physical Therapy Evaluation Patient Details Name: [name] MRN: [id number] DOB: 11/18/1945 Today's Date: 12/12/2014   History of Present Illness  [name] is a 69 y.o. female PMH of CHF, CKD, DM, HTN, seizures and morbid obesity who presented to the ED 12-11-14 via EMS with nausea and vomiting. Upon exam she was noted to have bradycardia, increased creatinine and vascular congestion. She was therefore admitted for further treatment.  Clinical Impression  Pt admitted with above diagnosis. Pt currently with functional limitations due to the deficits listed below (see PT Problem List). On eval, pt required min assist for bed mobility and transfers with RW. Pt on 2 L O2 via [place]. O2 removed for transfer to [facility]. Prior to removal, sats at 100%. On BSC, sats decreased to 74% on RA. Pt demo mild confusion and difficulty following commands. O2 replaced and sats increased to 86%. Upon sitting in recliner ~3-4 minutes, O2 sats increased to 97% with the [place] in place at 2 L/min. Pt will benefit from skilled PT to increase their independence and safety with mobility to allow discharge to the venue listed below.  Pt's desire is to return home. If 24-hour family assist is not available, SNF for continued therapy will need to be considered.     Follow Up Recommendations Home health PT;Supervision/Assistance - 24 hour    Equipment Recommendations  None recommended by PT    Recommendations for Other Services       Precautions / Restrictions Precautions Precautions: Fall      Mobility  Bed Mobility Overal bed mobility: Needs Assistance Bed Mobility: Supine to Sit     Supine to sit: Min assist;HOB elevated     General bed mobility comments: use of bed rail  Transfers Overall transfer level: Needs assistance Equipment used: Rolling walker (2 wheeled) Transfers: Sit to/from [facility] Sit to Stand: Min assist Stand pivot transfers: Min assist       General transfer  comment: verbal cues for sequencing  Ambulation/Gait             General Gait Details: unable to progress due to desat with transfer to [facility].  Stairs            Wheelchair Mobility    Modified Rankin (Stroke Patients Only)       Balance                                             Pertinent Vitals/Pain Pain Assessment: No/denies pain    Home Living Family/patient expects to be discharged to:: Private residence Living Arrangements: Alone Available Help at Discharge: Family;Available PRN/intermittently Type of Home: Apartment Home Access: Level entry     Home Layout: One level Home Equipment: Walker - 2 wheels;Shower seat;Toilet riser      Prior Function Level of Independence: Independent with assistive device(s)         Comments: ambulates with RW     Hand Dominance   Dominant Hand: Right    Extremity/Trunk Assessment   Upper Extremity Assessment: Generalized weakness           Lower Extremity Assessment: Generalized weakness      Cervical / Trunk Assessment: Normal  Communication   Communication: No difficulties  Cognition Arousal/Alertness: Awake/alert Behavior During Therapy: WFL for tasks assessed/performed Overall Cognitive Status: Within Functional Limits for tasks assessed  General Comments      Exercises        Assessment/Plan    PT Assessment Patient needs continued PT services  PT Diagnosis Difficulty walking;Generalized weakness   PT Problem List Decreased strength;Decreased activity tolerance;Decreased balance;Decreased mobility;Cardiopulmonary status limiting activity;Decreased safety awareness  PT Treatment Interventions Gait training;Functional mobility training;Therapeutic activities;Therapeutic exercise;Patient/family education;Balance training   PT Goals (Current goals can be found in the Care Plan section) Acute Rehab PT Goals Patient Stated Goal: home PT Goal  Formulation: With patient Time For Goal Achievement: 12/26/14 Potential to Achieve Goals: Good    Frequency Min 3X/week   Barriers to discharge Decreased caregiver support      Co-evaluation               End of Session Equipment Utilized During Treatment: Gait belt;Oxygen Activity Tolerance: Patient limited by fatigue;Treatment limited secondary to medical complications (Comment) (desat with mobility) Patient left: in chair;with call bell/phone within reach Nurse Communication: Mobility status         Time: [id number] PT Time Calculation (min) (ACUTE ONLY): 24 min   Charges:   PT Evaluation $Initial PT Evaluation Tier I: 1 Procedure PT Treatments $Therapeutic Activity: 8-22 mins   PT G Codes:        Lorriane Shire 12/12/2014, 11:33 AM

## 2014-12-12 NOTE — Evaluation (Addendum)
Occupational Therapy Evaluation Patient Details Name: JENALYNN ILER MRN: [id number] DOB: 09-12-1945 Today's Date: 12/12/2014    History of Present Illness MILAROSE GILSTER is a 69 y.o. female PMH of CHF, CKD, DM, HTN, seizures and morbid obesity who presented to the ED 12-11-14 via EMS with nausea and vomiting. Upon exam she was noted to have bradycardia, increased creatinine and vascular congestion. She was therefore admitted for further treatment.   Clinical Impression   Patient presenting with deconditioning, decreased independence, and decrease in cognition. Patient independent and living alone PTA. Patient currently functioning at an overall min to max assist level. Pt unable to stand during eval due to weakness and decrease in cognition. Patient will benefit from acute OT to increase overall independence in the areas of ADLs, functional mobility, and overall safety in order to safely discharge to venue listed below.   Pt on 3L/min supplemental 02 entire session. On RA, pt would de-sat to 84% while sitting.     Follow Up Recommendations  SNF;Supervision/Assistance - 24 hour    Equipment Recommendations  Other (comment) (TBD)    Recommendations for Other Services  None at this time   Precautions / Restrictions Precautions Precautions: Fall Restrictions Weight Bearing Restrictions: No    Mobility - Per PT note Bed Mobility Overal bed mobility: Needs Assistance Bed Mobility: Supine to Sit     Supine to sit: Min assist;HOB elevated     General bed mobility comments: use of bed rail  Transfers Overall transfer level: Needs assistance Equipment used: Rolling walker (2 wheeled) Transfers: Sit to/from [facility] Sit to Stand: Min assist Stand pivot transfers: Min assist       General transfer comment: verbal cues for sequencing    Balance Overall balance assessment: Needs assistance Sitting-balance support: No upper extremity supported;Feet  supported Sitting balance-Leahy Scale: Poor  Pt with jerking movement and unable to hold self up without support from UEs.     ADL Overall ADL's : Needs assistance/impaired Eating/Feeding: Set up;Sitting   Grooming: Set up;Sitting   Upper Body Bathing: Minimal assitance;Sitting   Lower Body Bathing: Moderate assistance;Sit to/from stand   Upper Body Dressing : Minimal assistance;Sitting   Lower Body Dressing: Maximal assistance;Sit to/from Health and safety inspector Details (indicate cue type and reason): did not assess   Toileting - Clothing Manipulation Details (indicate cue type and reason): did not assess   Tub/Shower Transfer Details (indicate cue type and reason): did not assess   General ADL Comments: Eval limited due to patient's decreased/poor cognition and generalized weakness. Therapist had patient lift BUEs for therapist to don gait belt. Pt barely able to lift up arms and when she did she experienced some jerking downward movement.     Pertinent Vitals/Pain Pain Assessment: No/denies pain     Hand Dominance Right   Extremity/Trunk Assessment Upper Extremity Assessment Upper Extremity Assessment: Generalized weakness (Pt unable to hold arms up during OT eval, arms kept jerking downward)   Lower Extremity Assessment Lower Extremity Assessment: Generalized weakness   Cervical / Trunk Assessment Cervical / Trunk Assessment: Normal   Communication Communication Communication: No difficulties   Cognition Arousal/Alertness: Lethargic (therapist woke patient for OT eval) Behavior During Therapy: WFL for tasks assessed/performed Overall Cognitive Status: Impaired/Different from baseline Area of Impairment: Orientation;Memory;Safety/judgement;Awareness;Problem solving Orientation Level: Disoriented to;Time   Memory: Decreased short-term memory   Safety/Judgement: Decreased awareness of safety;Decreased awareness of deficits   Problem Solving: Decreased  initiation;Slow processing;Difficulty sequencing;Requires verbal cues;Requires tactile  cues General Comments: Pt unable to state that the month was November even after multiple cueing.               Home Living Family/patient expects to be discharged to:: Private residence Living Arrangements: Alone Available Help at Discharge: Family;Available PRN/intermittently Type of Home: Apartment Home Access: Level entry     Home Layout: One level     Bathroom Shower/Tub: Tub/shower unit;Curtain   Biochemist, clinical: Standard     Home Equipment: Environmental consultant - 2 wheels;Shower seat;Toilet riser;Bedside commode   Additional Comments: Pt reports she has an aide come assist her ~2x per week. Pt states she does not have 24/7 supervision/assistance.       Prior Functioning/Environment Level of Independence: Independent with assistive device(s)  Comments: ambulates with RW    OT Diagnosis: Generalized weakness;Cognitive deficits   OT Problem List: Decreased strength;Decreased range of motion;Decreased activity tolerance;Impaired balance (sitting and/or standing);Decreased safety awareness;Decreased cognition;Decreased knowledge of use of DME or AE   OT Treatment/Interventions: Self-care/ADL training;Therapeutic exercise;Energy conservation;DME and/or AE instruction;Therapeutic activities;Patient/family education;Balance training    OT Goals(Current goals can be found in the care plan section) Acute Rehab OT Goals Patient Stated Goal: none stated OT Goal Formulation: With patient Time For Goal Achievement: 12/26/14 Potential to Achieve Goals: Good ADL Goals Pt Will Perform Grooming: with set-up;sitting;standing Pt Will Perform Lower Body Bathing: with supervision;sit to/from stand Pt Will Perform Lower Body Dressing: with supervision;sit to/from stand Pt Will Transfer to Toilet: with supervision;bedside commode Pt Will Perform Tub/Shower Transfer: Tub transfer;shower seat;ambulating;rolling  walker;with min assist Additional ADL Goal #1: Pt will be supervision using RW for functional mobility   OT Frequency: Min 2X/week   Barriers to D/C: Decreased caregiver support  Pt does not have 24/7 supervision/assistance post acute d/c   End of Session Equipment Utilized During Treatment: Gait belt;Oxygen Nurse Communication: Mobility status;Other (comment) (pt's confusion and jerky movements in BUEs)  Activity Tolerance: Patient limited by fatigue Patient left: in chair;with call bell/phone within reach   Time: 1330-1344 OT Time Calculation (min): 14 min Charges:  OT General Charges $OT Visit: 1 Procedure OT Evaluation $Initial OT Evaluation Tier I: 1 Procedure  Manvir Thorson , MS, OTR/L, CLT Pager: [phone]  12/12/2014, 1:56 PM

## 2014-12-12 NOTE — Plan of Care (Signed)
Problem: Safety: Goal: Ability to remain free from injury will improve Outcome: Progressing Patient able to use BSC with 2 assist.  Problem: Pain Managment: Goal: General experience of comfort will improve Outcome: Progressing Patient has been pain free throughout shift

## 2014-12-12 NOTE — Progress Notes (Signed)
Patient Demographics:    [name], is a 70 y.o. female, DOB - Oct 10, 1945, [id number]  Admit date - 12/11/2014   Admitting Physician [name], MD  Outpatient Primary MD for the patient is [name],[name], MD  LOS - 1   Chief Complaint  Patient presents with  . Emesis  . Bradycardia        Subjective:    [name] today has, No headache, No chest pain, No abdominal pain - No Nausea, No new weakness tingling or numbness, No Cough - SOB.     Assessment  & Plan :     1.N&V - most likely due to underlying constipation, KUB shows stool burden but no obstruction, place on bowel regimen and monitor. Currently nausea free. As needed IV Zofran. Initiate bowel regimen for constipation.   2. Bradycardia upon admission. Medication related, beta blocker and clonidine held, heart rate better. TSH stable. Echo unremarkable. Cardiology on board and has signed off. Currently in sinus. Blood pressure stable.   3. ARF and CK D stage IV. Baseline creatinine close to 2.2, likely due to nausea vomiting dehydration, has been gently hydrated, hold further diuretics monitor.   4. UTI. Place on Rocephin. Given a dose of fluconazole. Check renal ultrasound to rule out obstruction as she has renal failure as well.   5. Essential hypertension. Currently on Norvasc and hydralazine continued.   6. Chronic diastolic dysfunction with EF 55-60%. Clinically compensated monitor of diuretic. Mild troponin elevation not an ACS pattern, EKG nonacute, on aspirin, cannot use beta blocker due to bradycardia, no further workup per cardiology who has seen the patient. Chest pain-free.     Code Status :Full  Family Communication  : none  Disposition Plan  : TBD  Consults  : Cards  Procedures  :    TTE  Left ventricle: The cavity size was normal. Wall thickness wasincreased in a pattern of moderate LVH. Systolic function wasnormal. The estimated ejection fraction was in the range of 55%to 60%. Wall motion was normal; there were no regional wallmotion abnormalities. - Aortic valve: Valve area (VTI): 1.97 cm^2. Valve area (Vmax): 1.77 cm^2. Valve area (Vmean): 2.09 cm^2. - Left atrium: The atrium was mildly dilated.   Renal US  DVT Prophylaxis  :   Heparin    Lab Results  Component Value Date   PLT 176 12/12/2014    Inpatient Medications  Scheduled Meds: . amLODipine  10 mg Oral Daily  . antiseptic oral rinse  7 mL Mouth Rinse BID  . aspirin  81 mg Oral Daily  . carbamazepine  400 mg Oral BID  . cefTRIAXone (ROCEPHIN)  IV  1 g Intravenous Q24H  . citalopram  20 mg Oral Daily  . docusate sodium  100 mg Oral BID  . ezetimibe-simvastatin  1 tablet Oral QHS  . fluconazole  100 mg Oral Daily  . heparin  5,000 Units Subcutaneous 3 times per day  . hydrALAZINE  50 mg Oral 3 times per day  . Influenza vac split quadrivalent PF  0.5 mL Intramuscular Tomorrow-1000  . insulin aspart  0-15 Units Subcutaneous TID WC  . insulin aspart  4 Units Subcutaneous TID WC  . insulin glargine  15 Units Subcutaneous QHS  .  lacosamide  100 mg Oral BID  . pantoprazole  40 mg Oral Daily  . pneumococcal 23 valent vaccine  0.5 mL Intramuscular Tomorrow-1000   Continuous Infusions:  PRN Meds:.acetaminophen **OR** [DISCONTINUED] acetaminophen, albuterol, clobetasol cream, hydrALAZINE, ondansetron (ZOFRAN) IV, senna-docusate  Antibiotics  :     Anti-infectives    Start     Dose/Rate Route Frequency Ordered Stop   12/12/14 0800  cefTRIAXone (ROCEPHIN) 1 g in dextrose 5 % 50 mL IVPB     1 g 100 mL/hr over 30 Minutes Intravenous Every 24 hours 12/12/14 0725     12/11/14 1230  fluconazole (DIFLUCAN) tablet 100 mg     100 mg Oral Daily 12/11/14 1222          Objective:   Filed  Vitals:   12/12/14 1023 12/12/14 1024 12/12/14 1147 12/12/14 1548  BP: 128/46 128/46  142/52  Pulse:  68  64  Temp:   100.6 F (38.1 C)   TempSrc:   Oral   Resp:  19  22  Height:      Weight:      SpO2:  97%  100%    Wt Readings from Last 3 Encounters:  12/12/14 108.41 kg (239 lb)  11/22/14 109.68 kg (241 lb 12.8 oz)  07/24/14 113.445 kg (250 lb 1.6 oz)     Intake/Output Summary (Last 24 hours) at 12/12/14 1610 Last data filed at 12/12/14 1529  Gross per 24 hour  Intake    590 ml  Output   1225 ml  Net   -635 ml     Physical Exam  Awake Alert, Oriented X 3, No new F.N deficits, Normal affect [place].AT,PERRAL Supple Neck,No JVD, No cervical lymphadenopathy appriciated.  Symmetrical Chest wall movement, Good air movement bilaterally, CTAB RRR,No Gallops,Rubs or new Murmurs, No Parasternal Heave +ve B.Sounds, Abd Soft, No tenderness, No organomegaly appriciated, No rebound - guarding or rigidity. No Cyanosis, Clubbing or edema, No new Rash or bruise       Data Review:   Micro Results No results found for this or any previous visit (from the past 240 hour(s)).  Radiology Reports Dg Chest 2 View  12/11/2014  CLINICAL DATA:  Bradycardia and vomiting since yesterday. EXAM: CHEST  2 VIEW COMPARISON:  None. FINDINGS: Enlarged cardiac silhouette similar to prior. Exam is lordotic which exaggerates the cardiac silhouette. Interval increase in central venous congestion. Low lung volumes. No pleural fluid. IMPRESSION: Cardiomegaly and increased central venous congestion. Electronically Signed   By: Suzy Bouchard M.D.   On: 12/11/2014 09:31   Dg Chest Port 1 View  12/12/2014  CLINICAL DATA:  Shortness of breath for 2 days EXAM: PORTABLE CHEST 1 VIEW COMPARISON:  12/11/2014 FINDINGS: Cardiomegaly again noted. Mild thoracic dextroscoliosis. Degenerative changes thoracic spine. Central mild vascular congestion without pulmonary edema. No focal infiltrate IMPRESSION: Cardiomegaly.  Mild thoracic dextroscoliosis. Central vascular congestion without convincing pulmonary edema. No segmental infiltrate. Electronically Signed   By: Lahoma Crocker M.D.   On: 12/12/2014 08:00   Dg Abd Portable 1v  12/12/2014  CLINICAL DATA:  Nausea EXAM: PORTABLE ABDOMEN - 1 VIEW COMPARISON:  CT abdomen 08/06/2009 FINDINGS: Retained stool in the colon. Negative for bowel obstruction. No renal calculi. Lumbar degenerative change at L4-5 and L5-S1. No acute skeletal abnormality IMPRESSION: Constipation without bowel obstruction. Electronically Signed   By: Franchot Gallo M.D.   On: 12/12/2014 10:28     CBC  Recent Labs Lab 12/11/14 0835 12/11/14 1305 12/12/14 0042  WBC  7.1 7.3 9.4  HGB 10.2* 10.4* 9.6*  HCT 30.4* 30.6* 27.6*  PLT 197 202 176  MCV 89.9 89.5 89.6  MCH 30.2 30.4 31.2  MCHC 33.6 34.0 34.8  RDW 12.2 12.2 12.5  LYMPHSABS 0.9  --   --   MONOABS 0.9  --   --   EOSABS 0.0  --   --   BASOSABS 0.0  --   --     Chemistries   Recent Labs Lab 12/11/14 0835 12/11/14 1305 12/12/14 0042  NA 137  --  135  K 4.5  --  4.1  CL 103  --  102  CO2 24  --  21*  GLUCOSE 202*  --  132*  BUN 58*  --  60*  CREATININE 4.02* 3.74* 3.66*  CALCIUM 8.6*  --  8.3*  AST 20  --   --   ALT 15  --   --   ALKPHOS 95  --   --   BILITOT 0.7  --   --    ------------------------------------------------------------------------------------------------------------------ estimated creatinine clearance is 17.3 mL/min (by C-G formula based on Cr of 3.66). ------------------------------------------------------------------------------------------------------------------  Recent Labs  12/11/14 1305  HGBA1C 6.4*   ------------------------------------------------------------------------------------------------------------------ No results for input(s): CHOL, HDL, LDLCALC, TRIG, CHOLHDL, LDLDIRECT in the last 72  hours. ------------------------------------------------------------------------------------------------------------------  Recent Labs  12/11/14 1305  TSH 1.000   ------------------------------------------------------------------------------------------------------------------ No results for input(s): VITAMINB12, FOLATE, FERRITIN, TIBC, IRON, RETICCTPCT in the last 72 hours.  Coagulation profile No results for input(s): INR, PROTIME in the last 168 hours.  No results for input(s): DDIMER in the last 72 hours.  Cardiac Enzymes  Recent Labs Lab 12/11/14 1305 12/11/14 1905 12/12/14 0042  TROPONINI 0.49* 0.57* 0.49*   ------------------------------------------------------------------------------------------------------------------ Invalid input(s): POCBNP   Time Spent in minutes 35   SINGH,PRASHANT K M.D on 12/12/2014 at 4:10 PM  Between 7am to 7pm - Pager - [phone]  After 7pm go to [email or web address] - password [facility]  [facility] -  Office  [phone]

## 2014-12-13 ENCOUNTER — Inpatient Hospital Stay (HOSPITAL_COMMUNITY): Payer: Medicare Other

## 2014-12-13 LAB — BASIC METABOLIC PANEL
ANION GAP: 9 (ref 5–15)
BUN: 53 mg/dL — ABNORMAL HIGH (ref 6–20)
CALCIUM: 8.3 mg/dL — AB (ref 8.9–10.3)
CHLORIDE: 103 mmol/L (ref 101–111)
CO2: 25 mmol/L (ref 22–32)
Creatinine, Ser: 3.35 mg/dL — ABNORMAL HIGH (ref 0.44–1.00)
GFR calc Af Amer: 15 mL/min — ABNORMAL LOW (ref >60)
GFR calc non Af Amer: 13 mL/min — ABNORMAL LOW (ref >60)
GLUCOSE: 92 mg/dL (ref 65–99)
Potassium: 4.3 mmol/L (ref 3.5–5.1)
Sodium: 137 mmol/L (ref 135–145)

## 2014-12-13 LAB — CBC
HEMATOCRIT: 25.8 % — AB (ref 36.0–46.0)
HEMOGLOBIN: 8.9 g/dL — AB (ref 12.0–15.0)
MCH: 30.9 pg (ref 26.0–34.0)
MCHC: 34.5 g/dL (ref 30.0–36.0)
MCV: 89.6 fL (ref 78.0–100.0)
Platelets: 162 10*3/uL (ref 150–400)
RBC: 2.88 MIL/uL — ABNORMAL LOW (ref 3.87–5.11)
RDW: 12.5 % (ref 11.5–15.5)
WBC: 7.3 10*3/uL (ref 4.0–10.5)

## 2014-12-13 LAB — BRAIN NATRIURETIC PEPTIDE: B Natriuretic Peptide: 1254.1 pg/mL — ABNORMAL HIGH (ref 0.0–100.0)

## 2014-12-13 LAB — GLUCOSE, CAPILLARY
GLUCOSE-CAPILLARY: 86 mg/dL (ref 65–99)
GLUCOSE-CAPILLARY: 105 mg/dL — AB (ref 65–99)
GLUCOSE-CAPILLARY: 137 mg/dL — AB (ref 65–99)
Glucose-Capillary: 94 mg/dL (ref 65–99)

## 2014-12-13 LAB — URINE CULTURE

## 2014-12-13 MED ORDER — NITROGLYCERIN 2 % TD OINT
0.5000 [in_us] | TOPICAL_OINTMENT | Freq: Four times a day (QID) | TRANSDERMAL | Status: DC
  Administered 2014-12-13 – 2014-12-21 (×25): 0.5 [in_us] via TOPICAL
  Filled 2014-12-13: qty 30

## 2014-12-13 MED ORDER — VANCOMYCIN HCL IN DEXTROSE 1-5 GM/200ML-% IV SOLN
1000.0000 mg | INTRAVENOUS | Status: DC
  Administered 2014-12-13: 1000 mg via INTRAVENOUS
  Filled 2014-12-13 (×2): qty 200

## 2014-12-13 MED ORDER — FUROSEMIDE 10 MG/ML IJ SOLN
20.0000 mg | Freq: Once | INTRAMUSCULAR | Status: AC
  Administered 2014-12-13: 20 mg via INTRAVENOUS
  Filled 2014-12-13: qty 2

## 2014-12-13 NOTE — Progress Notes (Signed)
Patient showing concerns about bowel regimen. She is fearful of causing diarrhea. She did agree to take softener tonight. Will continue to monitor.

## 2014-12-13 NOTE — Care Management Note (Signed)
Case Management Note Marvetta Gibbons RN, BSN Unit 2W-Case Manager [phone] Covering 3W  Patient Details  Name: [name] MRN: [id number] Date of Birth: [birth year]  Subjective/Objective:  Pt admitted with bradycardia, CHF, ARF                  Action/Plan: PTA pt lived at home, per conversation with pt she has PCS aide that comes 2hr/day- she also has RW and tub bench at home- discussed [facility] services- and offered list for [facility] agencies of [facility]- pt states that she does not have a preference for agency- and is ok with using [facility] for [facility] services- referral made to Butch Penny with [facility] for HH-RN/PT/SW- (pt does not need aide as she already has aide through [facility])- orders have already been placed in epic.   Expected Discharge Date:                  Expected Discharge Plan:  [place]  In-House Referral:     Discharge planning Services  CM Consult  Post Acute Care Choice:  Home Health Choice offered to:  Patient  DME Arranged:  N/A DME Agency:  NA  HH Arranged:  RN, PT, Nurse's Aide, Social Work [employer] Agency:  [place]  Status of Service:  Completed, signed off  Medicare Important Message Given:    Date Medicare IM Given:    Medicare IM give by:    Date Additional Medicare IM Given:    Additional Medicare Important Message give by:     If discussed at [place] of Stay Meetings, dates discussed:    Additional Comments:  Dawayne Patricia, RN 12/13/2014, 11:54 AM

## 2014-12-13 NOTE — Progress Notes (Signed)
Physical Therapy Treatment Patient Details Name: [name] MRN: [id number] DOB: [birth year] Today's Date: 12/13/2014    History of Present Illness [name] is a 69 y.o. female PMH of CHF, CKD, DM, HTN, seizures and morbid obesity who presented to the ED 12-11-14 via EMS with nausea and vomiting. Upon exam she was noted to have bradycardia, increased creatinine and vascular congestion. She was therefore admitted for further treatment.    PT Comments    Pt was able to maintain O2 sats on 2L O2 via nasal cannula for a 25' walk on RW.  Pt did buckle at end but has been quite sick and off her feet recently.  Will persist with rehab to increase her strength and control with plan to get her home to PLOF.  Follow Up Recommendations  Home health PT;Supervision/Assistance - 24 hour     Equipment Recommendations  None recommended by PT    Recommendations for Other Services       Precautions / Restrictions Precautions Precautions: Fall Precaution Comments: telemetry Restrictions Weight Bearing Restrictions: No    Mobility  Bed Mobility               General bed mobility comments: up when PT entered  Transfers Overall transfer level: Needs assistance Equipment used: Rolling walker (2 wheeled) Transfers: Sit to/from [facility] Sit to Stand: Min assist Stand pivot transfers: Min assist       General transfer comment: cued hand placement every trial  Ambulation/Gait Ambulation/Gait assistance: Min guard;Min assist Ambulation Distance (Feet): 25 Feet Assistive device: Rolling walker (2 wheeled);1 person hand held assist Gait Pattern/deviations: Step-through pattern;Decreased stride length;Wide base of support;Trunk flexed Gait velocity: slow Gait velocity interpretation: Below normal speed for age/gender General Gait Details: Pt buckled at end of walk and had to sit, O2 sats normal at 96% after sitting   Stairs            Wheelchair  Mobility    Modified Rankin (Stroke Patients Only)       Balance Overall balance assessment: Needs assistance Sitting-balance support: Feet supported Sitting balance-Leahy Scale: Fair   Postural control: Posterior lean Standing balance support: Bilateral upper extremity supported Standing balance-Leahy Scale: Poor Standing balance comment: cues for safety and directing walker                    Cognition Arousal/Alertness: Awake/alert (up in chair) Behavior During Therapy: WFL for tasks assessed/performed Overall Cognitive Status: Within Functional Limits for tasks assessed                 General Comments: Pt was able to carry on appropriate conversation with PT and PT aide    Exercises      General Comments General comments (skin integrity, edema, etc.): No skin issues were noted and pt was able to follow instructions      Pertinent Vitals/Pain Pain Assessment: No/denies pain    Home Living                      Prior Function            PT Goals (current goals can now be found in the care plan section) Acute Rehab PT Goals Patient Stated Goal: none stated Progress towards PT goals: Progressing toward goals    Frequency  Min 3X/week    PT Plan Current plan remains appropriate    Co-evaluation  End of Session Equipment Utilized During Treatment: Gait belt;Oxygen Activity Tolerance: Patient limited by fatigue Patient left: in chair;with call bell/phone within reach;with nursing/sitter in room;with family/visitor present     Time: [id number] PT Time Calculation (min) (ACUTE ONLY): 23 min  Charges:  $Gait Training: 8-22 mins $Therapeutic Activity: 8-22 mins                    G Codes:      Veverly, [place] 2015-01-11, 5:00 PM   Mee Hives, PT MS Acute Rehab Dept. Number: [facility] [phone] and [place] [phone]

## 2014-12-13 NOTE — Progress Notes (Signed)
Pt OOB in chair from 1130 until 1830 today.  Oxygen weaned down to 1.5 liters nasal cannula.  Will continue to monitor.

## 2014-12-13 NOTE — Progress Notes (Signed)
Patient Demographics:    [name], is a 69 y.o. female, DOB - 04-Apr-1945, [id number]  Admit date - 12/11/2014   Admitting Physician [name], MD  Outpatient Primary MD for the patient is [name],[name], MD  LOS - 2   Chief Complaint  Patient presents with  . Emesis  . Bradycardia        Subjective:    [name] today has, No headache, No chest pain, No abdominal pain - No Nausea, No new weakness tingling or numbness, No Cough - SOB.     Assessment  & Plan :     1.N&V - most likely due to underlying constipation, KUB shows stool burden but no obstruction, place on bowel regimen and monitor. Currently nausea free. As needed IV Zofran. Initiated bowel regimen for constipation. Now symptom free and feels better.   2. Bradycardia upon admission. Medication related, beta blocker and clonidine held, heart rate better. TSH stable. Echo unremarkable. Cardiology on board and has signed off. Currently in sinus. Blood pressure stable.   3. ARF and CK D stage IV. Baseline creatinine close to 2.2, likely due to nausea vomiting dehydration, has been gently hydrated, hold further diuretics monitor.   4. UTI. Placed on Rocephin. Given a dose of fluconazole. Stable Renal US.   5. Essential hypertension. Currently on Norvasc and hydralazine continued.   6. Chronic diastolic dysfunction with EF 55-60%. Clinically compensated monitor of diuretic. Mild troponin elevation not an ACS pattern, EKG nonacute, on aspirin, cannot use beta blocker due to bradycardia, no further workup per cardiology who has seen the patient. Chest pain-free.     Code Status :Full  Family Communication  : none  Disposition Plan  : HHPT  Consults  : Cards  Procedures  :   TTE  Left ventricle: The cavity  size was normal. Wall thickness wasincreased in a pattern of moderate LVH. Systolic function wasnormal. The estimated ejection fraction was in the range of 55%to 60%. Wall motion was normal; there were no regional wallmotion abnormalities. - Aortic valve: Valve area (VTI): 1.97 cm^2. Valve area (Vmax): 1.77 cm^2. Valve area (Vmean): 2.09 cm^2. - Left atrium: The atrium was mildly dilated.   Renal US - Stable   DVT Prophylaxis  :   Heparin    Lab Results  Component Value Date   PLT 162 12/13/2014    Inpatient Medications  Scheduled Meds: . amLODipine  10 mg Oral Daily  . antiseptic oral rinse  7 mL Mouth Rinse BID  . aspirin  81 mg Oral Daily  . bisacodyl  10 mg Rectal Daily  . carbamazepine  400 mg Oral BID  . cefTRIAXone (ROCEPHIN)  IV  1 g Intravenous Q24H  . citalopram  20 mg Oral Daily  . docusate sodium  200 mg Oral BID  . ezetimibe-simvastatin  1 tablet Oral QHS  . fluconazole  100 mg Oral Daily  . furosemide  20 mg Intravenous Once  . heparin  5,000 Units Subcutaneous 3 times per day  . hydrALAZINE  50 mg Oral 3 times per day  . Influenza vac split quadrivalent PF  0.5 mL Intramuscular Tomorrow-1000  . insulin aspart  0-15 Units Subcutaneous TID WC  . insulin aspart  4 Units  Subcutaneous TID WC  . insulin glargine  15 Units Subcutaneous QHS  . lacosamide  100 mg Oral BID  . nitroGLYCERIN  0.5 inch Topical 4 times per day  . pantoprazole  40 mg Oral Daily  . pneumococcal 23 valent vaccine  0.5 mL Intramuscular Tomorrow-1000  . polyethylene glycol  17 g Oral BID   Continuous Infusions:  PRN Meds:.acetaminophen **OR** [DISCONTINUED] acetaminophen, albuterol, clobetasol cream, hydrALAZINE, ondansetron (ZOFRAN) IV, senna-docusate  Antibiotics  :     Anti-infectives    Start     Dose/Rate Route Frequency Ordered Stop   12/12/14 0800  cefTRIAXone (ROCEPHIN) 1 g in dextrose 5 % 50 mL IVPB     1 g 100 mL/hr over 30 Minutes Intravenous Every 24 hours 12/12/14  0725     12/11/14 1230  fluconazole (DIFLUCAN) tablet 100 mg     100 mg Oral Daily 12/11/14 1222          Objective:   Filed Vitals:   12/13/14 0058 12/13/14 0433 12/13/14 0650 12/13/14 1010  BP: 151/63 151/54 151/54 147/73  Pulse: 72 73 61 68  Temp: 99.3 F (37.4 C) 99.9 F (37.7 C)    TempSrc: Oral Oral    Resp: 19 25 14 16   Height:      Weight:  99.292 kg (218 lb 14.4 oz)    SpO2: 100% 98% 100% 98%    Wt Readings from Last 3 Encounters:  12/13/14 99.292 kg (218 lb 14.4 oz)  11/22/14 109.68 kg (241 lb 12.8 oz)  07/24/14 113.445 kg (250 lb 1.6 oz)     Intake/Output Summary (Last 24 hours) at 12/13/14 1019 Last data filed at 12/13/14 0000  Gross per 24 hour  Intake    520 ml  Output    775 ml  Net   -255 ml     Physical Exam  Awake Alert, Oriented X 3, No new F.N deficits, Normal affect [place].AT,PERRAL Supple Neck,No JVD, No cervical lymphadenopathy appriciated.  Symmetrical Chest wall movement, Good air movement bilaterally, CTAB RRR,No Gallops,Rubs or new Murmurs, No Parasternal Heave +ve B.Sounds, Abd Soft, No tenderness, No organomegaly appriciated, No rebound - guarding or rigidity. No Cyanosis, Clubbing or edema, No new Rash or bruise       Data Review:   Micro Results Recent Results (from the past 240 hour(s))  Urine culture     Status: None   Collection Time: 12/11/14  8:58 AM  Result Value Ref Range Status   Specimen Description URINE, RANDOM  Final   Special Requests NONE  Final   Culture MULTIPLE SPECIES PRESENT, SUGGEST RECOLLECTION  Final   Report Status 12/13/2014 FINAL  Final  Culture, blood (routine x 2)     Status: None (Preliminary result)   Collection Time: 12/12/14  4:50 PM  Result Value Ref Range Status   Specimen Description BLOOD RIGHT ARM  Final   Special Requests IN PEDIATRIC BOTTLE [place]  Final   Culture PENDING  Incomplete   Report Status PENDING  Incomplete    Radiology Reports Dg Chest 2 View  12/11/2014  CLINICAL DATA:   Bradycardia and vomiting since yesterday. EXAM: CHEST  2 VIEW COMPARISON:  None. FINDINGS: Enlarged cardiac silhouette similar to prior. Exam is lordotic which exaggerates the cardiac silhouette. Interval increase in central venous congestion. Low lung volumes. No pleural fluid. IMPRESSION: Cardiomegaly and increased central venous congestion. Electronically Signed   By: Suzy Bouchard M.D.   On: 12/11/2014 09:31   US Renal  12/12/2014  CLINICAL DATA:  Acute renal failure EXAM: RENAL / URINARY TRACT ULTRASOUND COMPLETE COMPARISON:  None. FINDINGS: Right Kidney: Length: 9.7 cm. Echogenicity within normal limits. No mass or hydronephrosis visualized. Left Kidney: Length: 9.2 cm. Echogenicity within normal limits. No mass or hydronephrosis visualized. Bladder: Decompressed following voiding IMPRESSION: No acute abnormality noted. Electronically Signed   By: Inez Catalina M.D.   On: 12/12/2014 16:56   Dg Chest Port 1 View  12/13/2014  CLINICAL DATA:  Shortness of breath. EXAM: PORTABLE CHEST 1 VIEW COMPARISON:  December 12, 2014 FINDINGS: Stable mild cardiomegaly is noted with mild central pulmonary vascular congestion. No pneumothorax or pleural effusion is noted. Both lungs are clear. Osteophyte formation is noted in mid thoracic spine. IMPRESSION: Stable mild cardiomegaly with central pulmonary vascular congestion. No significant change compared to prior exam. Electronically Signed   By: Marijo Conception, M.D.   On: 12/13/2014 07:55   Dg Chest Port 1 View  12/12/2014  CLINICAL DATA:  Shortness of breath for 2 days EXAM: PORTABLE CHEST 1 VIEW COMPARISON:  12/11/2014 FINDINGS: Cardiomegaly again noted. Mild thoracic dextroscoliosis. Degenerative changes thoracic spine. Central mild vascular congestion without pulmonary edema. No focal infiltrate IMPRESSION: Cardiomegaly. Mild thoracic dextroscoliosis. Central vascular congestion without convincing pulmonary edema. No segmental infiltrate. Electronically  Signed   By: Lahoma Crocker M.D.   On: 12/12/2014 08:00   Dg Abd Portable 1v  12/12/2014  CLINICAL DATA:  Nausea EXAM: PORTABLE ABDOMEN - 1 VIEW COMPARISON:  CT abdomen 08/06/2009 FINDINGS: Retained stool in the colon. Negative for bowel obstruction. No renal calculi. Lumbar degenerative change at L4-5 and L5-S1. No acute skeletal abnormality IMPRESSION: Constipation without bowel obstruction. Electronically Signed   By: Franchot Gallo M.D.   On: 12/12/2014 10:28     CBC  Recent Labs Lab 12/11/14 0835 12/11/14 1305 12/12/14 0042 12/13/14 0612  WBC 7.1 7.3 9.4 7.3  HGB 10.2* 10.4* 9.6* 8.9*  HCT 30.4* 30.6* 27.6* 25.8*  PLT 197 202 176 162  MCV 89.9 89.5 89.6 89.6  MCH 30.2 30.4 31.2 30.9  MCHC 33.6 34.0 34.8 34.5  RDW 12.2 12.2 12.5 12.5  LYMPHSABS 0.9  --   --   --   MONOABS 0.9  --   --   --   EOSABS 0.0  --   --   --   BASOSABS 0.0  --   --   --     Chemistries   Recent Labs Lab 12/11/14 0835 12/11/14 1305 12/12/14 0042 12/13/14 0612  NA 137  --  135 137  K 4.5  --  4.1 4.3  CL 103  --  102 103  CO2 24  --  21* 25  GLUCOSE 202*  --  132* 92  BUN 58*  --  60* 53*  CREATININE 4.02* 3.74* 3.66* 3.35*  CALCIUM 8.6*  --  8.3* 8.3*  AST 20  --   --   --   ALT 15  --   --   --   ALKPHOS 95  --   --   --   BILITOT 0.7  --   --   --    ------------------------------------------------------------------------------------------------------------------ estimated creatinine clearance is 18 mL/min (by C-G formula based on Cr of 3.35). ------------------------------------------------------------------------------------------------------------------  Recent Labs  12/11/14 1305  HGBA1C 6.4*   ------------------------------------------------------------------------------------------------------------------ No results for input(s): CHOL, HDL, LDLCALC, TRIG, CHOLHDL, LDLDIRECT in the last 72  hours. ------------------------------------------------------------------------------------------------------------------  Recent Labs  12/11/14 1305  TSH 1.000   ------------------------------------------------------------------------------------------------------------------  No results for input(s): VITAMINB12, FOLATE, FERRITIN, TIBC, IRON, RETICCTPCT in the last 72 hours.  Coagulation profile No results for input(s): INR, PROTIME in the last 168 hours.  No results for input(s): DDIMER in the last 72 hours.  Cardiac Enzymes  Recent Labs Lab 12/11/14 1305 12/11/14 1905 12/12/14 0042  TROPONINI 0.49* 0.57* 0.49*   ------------------------------------------------------------------------------------------------------------------ Invalid input(s): POCBNP   Time Spent in minutes 35   Lucylle Foulkes K M.D on 12/13/2014 at 10:19 AM  Between 7am to 7pm - Pager - [phone]  After 7pm go to [email or web address] - password [facility]  [facility] -  Office  [phone]

## 2014-12-13 NOTE — Progress Notes (Signed)
ANTIBIOTIC CONSULT NOTE - INITIAL  Pharmacy Consult for Vancomycin Indication: GPC in blood cultures  Allergies  Allergen Reactions  . Codeine Nausea And Vomiting    Patient Measurements: Height: 5\' 2"  (157.5 cm) Weight: 218 lb 14.4 oz (99.292 kg) IBW/kg (Calculated) : 50.1 Adjusted Body Weight: n/a  Vital Signs: Temp: 99 F (37.2 C) (11/18 1545) Temp Source: Oral (11/18 1545) BP: 169/63 mmHg (11/18 1810) Pulse Rate: 69 (11/18 1810) Intake/Output from previous day: 11/17 0701 - 11/18 0700 In: 580 [P.O.:580] Out: 1125 [Urine:1125] Intake/Output from this shift:    Labs:  Recent Labs  12/11/14 1305 12/12/14 0042 12/13/14 0612  WBC 7.3 9.4 7.3  HGB 10.4* 9.6* 8.9*  PLT 202 176 162  CREATININE 3.74* 3.66* 3.35*   Estimated Creatinine Clearance: 18 mL/min (by C-G formula based on Cr of 3.35). No results for input(s): VANCOTROUGH, VANCOPEAK, VANCORANDOM, GENTTROUGH, GENTPEAK, GENTRANDOM, TOBRATROUGH, TOBRAPEAK, TOBRARND, AMIKACINPEAK, AMIKACINTROU, AMIKACIN in the last 72 hours.   Microbiology: Recent Results (from the past 720 hour(s))  Urine culture     Status: None   Collection Time: 12/11/14  8:58 AM  Result Value Ref Range Status   Specimen Description URINE, RANDOM  Final   Special Requests NONE  Final   Culture MULTIPLE SPECIES PRESENT, SUGGEST RECOLLECTION  Final   Report Status 12/13/2014 FINAL  Final  Culture, blood (routine x 2)     Status: None (Preliminary result)   Collection Time: 12/12/14  4:50 PM  Result Value Ref Range Status   Specimen Description BLOOD RIGHT ARM  Final   Special Requests IN PEDIATRIC BOTTLE 1CC  Final   Culture  Setup Time   Final    GRAM POSITIVE COCCI IN CLUSTERS AEROBIC BOTTLE ONLY CRITICAL RESULT CALLED TO, READ BACK BY AND VERIFIED WITH: Dolores Hoose RN 2014 12/13/14 A BROWNING    Culture NO GROWTH < 24 HOURS  Final   Report Status PENDING  Incomplete  Culture, blood (routine x 2)     Status: None (Preliminary result)    Collection Time: 12/12/14  4:59 PM  Result Value Ref Range Status   Specimen Description BLOOD RIGHT ANTECUBITAL  Final   Special Requests BOTTLES DRAWN AEROBIC AND ANAEROBIC 5CC 5CC  Final   Culture NO GROWTH < 24 HOURS  Final   Report Status PENDING  Incomplete    Medical History: Past Medical History  Diagnosis Date  . Essential hypertension   . CKD (chronic kidney disease), stage IV ([place])   . Hyperlipidemia   . GERD (gastroesophageal reflux disease)   . Morbid obesity ([place])   . Depression   . CHF (congestive heart failure) ([place])   . Type II diabetes mellitus ([place])   . Anemia   . Seizures ([place])     "epilepsy; they think I had one a couple months ago" (12/11/2014)    Medications:  Scheduled:  . amLODipine  10 mg Oral Daily  . antiseptic oral rinse  7 mL Mouth Rinse BID  . aspirin  81 mg Oral Daily  . bisacodyl  10 mg Rectal Daily  . carbamazepine  400 mg Oral BID  . cefTRIAXone (ROCEPHIN)  IV  1 g Intravenous Q24H  . citalopram  20 mg Oral Daily  . docusate sodium  200 mg Oral BID  . ezetimibe-simvastatin  1 tablet Oral QHS  . fluconazole  100 mg Oral Daily  . heparin  5,000 Units Subcutaneous 3 times per day  . hydrALAZINE  50 mg Oral 3 times  per day  . Influenza vac split quadrivalent PF  0.5 mL Intramuscular Tomorrow-1000  . insulin aspart  0-15 Units Subcutaneous TID WC  . insulin aspart  4 Units Subcutaneous TID WC  . insulin glargine  15 Units Subcutaneous QHS  . lacosamide  100 mg Oral BID  . nitroGLYCERIN  0.5 inch Topical 4 times per day  . pantoprazole  40 mg Oral Daily  . pneumococcal 23 valent vaccine  0.5 mL Intramuscular Tomorrow-1000  . polyethylene glycol  17 g Oral BID   Assessment: 69 yo female admitted with nausea and vomiting.  Blood cultures growing gram positive cocci in clusters in 1/2.  Urine culture with multiple species.  Pharmacy asked to begin empiric vancomycin.  Also with acute on chronic CKD (stage IV).  CrCl ~ 18 ml/min  Goal of  Therapy:  Vancomycin trough level 15-20 mcg/ml  Plan:  1. Vancomycin 1g IV q 48 hrs for now. 2. Will need to watch renal function closely and adjust dose accordingly. 3. F/u blood culture results.  Uvaldo Rising, BCPS  Clinical Pharmacist Pager [phone]  12/13/2014 8:30 PM

## 2014-12-13 NOTE — Progress Notes (Signed)
Notified from microbiology of GRAM POSITIVE COCCI IN CLUSTERS  AEROBIC BOTTLE .  Paged to NP on call

## 2014-12-14 ENCOUNTER — Inpatient Hospital Stay (HOSPITAL_COMMUNITY): Payer: Medicare Other

## 2014-12-14 LAB — GLUCOSE, CAPILLARY
GLUCOSE-CAPILLARY: 133 mg/dL — AB (ref 65–99)
Glucose-Capillary: 91 mg/dL (ref 65–99)
Glucose-Capillary: 193 mg/dL — ABNORMAL HIGH (ref 65–99)
Glucose-Capillary: 123 mg/dL — ABNORMAL HIGH (ref 65–99)

## 2014-12-14 MED ORDER — FUROSEMIDE 40 MG PO TABS
40.0000 mg | ORAL_TABLET | Freq: Two times a day (BID) | ORAL | Status: DC
  Administered 2014-12-14 – 2014-12-15 (×3): 40 mg via ORAL
  Filled 2014-12-14 (×3): qty 1

## 2014-12-14 NOTE — Care Management Important Message (Signed)
Important Message  Patient Details  Name: [name] MRN: [id number] Date of Birth: 31-Oct-1945   Medicare Important Message Given:  Yes    Norina Buzzard, RN 12/14/2014, 10:31 AM

## 2014-12-14 NOTE — Progress Notes (Signed)
Patient Demographics:    [name], is a 69 y.o. female, DOB - [birth year], [id number]  Admit date - 12/11/2014   Admitting Physician [name], MD  Outpatient Primary MD for the patient is [name],[name], MD  LOS - 3   Chief Complaint  Patient presents with  . Emesis  . Bradycardia        Subjective:    [name] today has, No headache, No chest pain, No abdominal pain - No Nausea, No new weakness tingling or numbness, No Cough - SOB.     Assessment  & Plan :     1.N&V - most likely due to underlying constipation, KUB shows stool burden but no obstruction, place on bowel regimen and monitor. Currently nausea free. As needed IV Zofran. Initiated bowel regimen for constipation. Now symptom free and feels better.   2. Bradycardia upon admission. Medication related, beta blocker and clonidine held, heart rate better. TSH stable. Echo unremarkable. Cardiology on board and has signed off. Currently in sinus. Blood pressure stable.   3. ARF and CK D stage IV. Baseline creatinine close to 2.2, likely due to nausea vomiting dehydration, has been gently hydrated, will resume low-dose diuretics now has creatinine has trended Woods improvement.   4. UTI. Placed on Rocephin. Given a dose of fluconazole. Stable Renal US.   5. Essential hypertension. Currently on Norvasc and hydralazine continued.   6. Chronic diastolic dysfunction with EF 55-60%. Clinically compensated monitor of diuretic. Mild troponin elevation not an ACS pattern, EKG nonacute, on aspirin, cannot use beta blocker due to bradycardia, no further workup per cardiology who has seen the patient. Chest pain-free.   7. 1 out of 2 blood cultures positive for gram-positive cocci, likely contamination. Monitor. Has received 1  dose of vancomycin on 12/13/2014 night.    Code Status : Full  Family Communication  : none  Disposition Plan  : HHPT  Consults  : Cards  Procedures  :   TTE  Left ventricle: The cavity size was normal. Wall thickness wasincreased in a pattern of moderate LVH. Systolic function wasnormal. The estimated ejection fraction was in the range of 55%to 60%. Wall motion was normal; there were no regional wallmotion abnormalities. - Aortic valve: Valve area (VTI): 1.97 cm^2. Valve area (Vmax): 1.77 cm^2. Valve area (Vmean): 2.09 cm^2. - Left atrium: The atrium was mildly dilated.   Renal US - Stable   DVT Prophylaxis  :   Heparin    Lab Results  Component Value Date   PLT 162 12/13/2014    Inpatient Medications  Scheduled Meds: . amLODipine  10 mg Oral Daily  . antiseptic oral rinse  7 mL Mouth Rinse BID  . aspirin  81 mg Oral Daily  . bisacodyl  10 mg Rectal Daily  . carbamazepine  400 mg Oral BID  . cefTRIAXone (ROCEPHIN)  IV  1 g Intravenous Q24H  . citalopram  20 mg Oral Daily  . docusate sodium  200 mg Oral BID  . ezetimibe-simvastatin  1 tablet Oral QHS  . fluconazole  100 mg Oral Daily  . heparin  5,000 Units Subcutaneous 3 times per day  . hydrALAZINE  50 mg Oral 3 times per day  . Influenza vac split  quadrivalent PF  0.5 mL Intramuscular Tomorrow-1000  . insulin aspart  0-15 Units Subcutaneous TID WC  . insulin aspart  4 Units Subcutaneous TID WC  . insulin glargine  15 Units Subcutaneous QHS  . lacosamide  100 mg Oral BID  . nitroGLYCERIN  0.5 inch Topical 4 times per day  . pantoprazole  40 mg Oral Daily  . pneumococcal 23 valent vaccine  0.5 mL Intramuscular Tomorrow-1000  . polyethylene glycol  17 g Oral BID  . vancomycin  1,000 mg Intravenous Q48H   Continuous Infusions:  PRN Meds:.acetaminophen **OR** [DISCONTINUED] acetaminophen, albuterol, clobetasol cream, hydrALAZINE, ondansetron (ZOFRAN) IV, senna-docusate  Antibiotics  :      Anti-infectives    Start     Dose/Rate Route Frequency Ordered Stop   12/13/14 2045  vancomycin (VANCOCIN) IVPB 1000 mg/200 mL premix     1,000 mg 200 mL/hr over 60 Minutes Intravenous Every 48 hours 12/13/14 2031     12/12/14 0800  cefTRIAXone (ROCEPHIN) 1 g in dextrose 5 % 50 mL IVPB     1 g 100 mL/hr over 30 Minutes Intravenous Every 24 hours 12/12/14 0725     12/11/14 1230  fluconazole (DIFLUCAN) tablet 100 mg     100 mg Oral Daily 12/11/14 1222          Objective:   Filed Vitals:   12/13/14 2055 12/14/14 0012 12/14/14 0300 12/14/14 0440  BP: 181/68 172/68  179/50  Pulse: 77 76  79  Temp: 99.4 F (37.4 C) 99.1 F (37.3 C)  99.6 F (37.6 C)  TempSrc: Oral Oral  Oral  Resp: 18 18 18 18   Height:      Weight:    104.101 kg (229 lb 8 oz)  SpO2: 97%   99%    Wt Readings from Last 3 Encounters:  12/14/14 104.101 kg (229 lb 8 oz)  11/22/14 109.68 kg (241 lb 12.8 oz)  07/24/14 113.445 kg (250 lb 1.6 oz)     Intake/Output Summary (Last 24 hours) at 12/14/14 1010 Last data filed at 12/14/14 0446  Gross per 24 hour  Intake    560 ml  Output   1450 ml  Net   -890 ml     Physical Exam  Awake Alert, Oriented X 3, No new F.N deficits, Normal affect [place].AT,PERRAL Supple Neck,No JVD, No cervical lymphadenopathy appriciated.  Symmetrical Chest wall movement, Good air movement bilaterally, CTAB RRR,No Gallops,Rubs or new Murmurs, No Parasternal Heave +ve B.Sounds, Abd Soft, No tenderness, No organomegaly appriciated, No rebound - guarding or rigidity. No Cyanosis, Clubbing or edema, No new Rash or bruise       Data Review:   Micro Results Recent Results (from the past 240 hour(s))  Urine culture     Status: None   Collection Time: 12/11/14  8:58 AM  Result Value Ref Range Status   Specimen Description URINE, RANDOM  Final   Special Requests NONE  Final   Culture MULTIPLE SPECIES PRESENT, SUGGEST RECOLLECTION  Final   Report Status 12/13/2014 FINAL  Final   Culture, blood (routine x 2)     Status: None (Preliminary result)   Collection Time: 12/12/14  4:50 PM  Result Value Ref Range Status   Specimen Description BLOOD RIGHT ARM  Final   Special Requests IN PEDIATRIC BOTTLE 1CC  Final   Culture  Setup Time   Final    GRAM POSITIVE COCCI IN CLUSTERS AEROBIC BOTTLE ONLY CRITICAL RESULT CALLED TO, READ BACK BY AND VERIFIED  WITH: Dolores Hoose RN 2014 12/13/14 A BROWNING    Culture TOO YOUNG TO READ  Final   Report Status PENDING  Incomplete  Culture, blood (routine x 2)     Status: None (Preliminary result)   Collection Time: 12/12/14  4:59 PM  Result Value Ref Range Status   Specimen Description BLOOD RIGHT ANTECUBITAL  Final   Special Requests BOTTLES DRAWN AEROBIC AND ANAEROBIC 5CC 5CC  Final   Culture NO GROWTH < 24 HOURS  Final   Report Status PENDING  Incomplete    Radiology Reports Dg Chest 2 View  12/11/2014  CLINICAL DATA:  Bradycardia and vomiting since yesterday. EXAM: CHEST  2 VIEW COMPARISON:  None. FINDINGS: Enlarged cardiac silhouette similar to prior. Exam is lordotic which exaggerates the cardiac silhouette. Interval increase in central venous congestion. Low lung volumes. No pleural fluid. IMPRESSION: Cardiomegaly and increased central venous congestion. Electronically Signed   By: Suzy Bouchard M.D.   On: 12/11/2014 09:31   US Renal  12/12/2014  CLINICAL DATA:  Acute renal failure EXAM: RENAL / URINARY TRACT ULTRASOUND COMPLETE COMPARISON:  None. FINDINGS: Right Kidney: Length: 9.7 cm. Echogenicity within normal limits. No mass or hydronephrosis visualized. Left Kidney: Length: 9.2 cm. Echogenicity within normal limits. No mass or hydronephrosis visualized. Bladder: Decompressed following voiding IMPRESSION: No acute abnormality noted. Electronically Signed   By: Inez Catalina M.D.   On: 12/12/2014 16:56   Dg Chest Port 1 View  12/13/2014  CLINICAL DATA:  Shortness of breath. EXAM: PORTABLE CHEST 1 VIEW COMPARISON:   December 12, 2014 FINDINGS: Stable mild cardiomegaly is noted with mild central pulmonary vascular congestion. No pneumothorax or pleural effusion is noted. Both lungs are clear. Osteophyte formation is noted in mid thoracic spine. IMPRESSION: Stable mild cardiomegaly with central pulmonary vascular congestion. No significant change compared to prior exam. Electronically Signed   By: Marijo Conception, M.D.   On: 12/13/2014 07:55   Dg Chest Port 1 View  12/12/2014  CLINICAL DATA:  Shortness of breath for 2 days EXAM: PORTABLE CHEST 1 VIEW COMPARISON:  12/11/2014 FINDINGS: Cardiomegaly again noted. Mild thoracic dextroscoliosis. Degenerative changes thoracic spine. Central mild vascular congestion without pulmonary edema. No focal infiltrate IMPRESSION: Cardiomegaly. Mild thoracic dextroscoliosis. Central vascular congestion without convincing pulmonary edema. No segmental infiltrate. Electronically Signed   By: Lahoma Crocker M.D.   On: 12/12/2014 08:00   Dg Abd Portable 1v  12/12/2014  CLINICAL DATA:  Nausea EXAM: PORTABLE ABDOMEN - 1 VIEW COMPARISON:  CT abdomen 08/06/2009 FINDINGS: Retained stool in the colon. Negative for bowel obstruction. No renal calculi. Lumbar degenerative change at L4-5 and L5-S1. No acute skeletal abnormality IMPRESSION: Constipation without bowel obstruction. Electronically Signed   By: Franchot Gallo M.D.   On: 12/12/2014 10:28     CBC  Recent Labs Lab 12/11/14 0835 12/11/14 1305 12/12/14 0042 12/13/14 0612  WBC 7.1 7.3 9.4 7.3  HGB 10.2* 10.4* 9.6* 8.9*  HCT 30.4* 30.6* 27.6* 25.8*  PLT 197 202 176 162  MCV 89.9 89.5 89.6 89.6  MCH 30.2 30.4 31.2 30.9  MCHC 33.6 34.0 34.8 34.5  RDW 12.2 12.2 12.5 12.5  LYMPHSABS 0.9  --   --   --   MONOABS 0.9  --   --   --   EOSABS 0.0  --   --   --   BASOSABS 0.0  --   --   --     Chemistries   Recent Labs Lab 12/11/14 [phone]  12/11/14 1305 12/12/14 0042 12/13/14 0612  NA 137  --  135 137  K 4.5  --  4.1 4.3  CL 103   --  102 103  CO2 24  --  21* 25  GLUCOSE 202*  --  132* 92  BUN 58*  --  60* 53*  CREATININE 4.02* 3.74* 3.66* 3.35*  CALCIUM 8.6*  --  8.3* 8.3*  AST 20  --   --   --   ALT 15  --   --   --   ALKPHOS 95  --   --   --   BILITOT 0.7  --   --   --    ------------------------------------------------------------------------------------------------------------------ estimated creatinine clearance is 18.4 mL/min (by C-G formula based on Cr of 3.35). ------------------------------------------------------------------------------------------------------------------  Recent Labs  12/11/14 1305  HGBA1C 6.4*   ------------------------------------------------------------------------------------------------------------------ No results for input(s): CHOL, HDL, LDLCALC, TRIG, CHOLHDL, LDLDIRECT in the last 72 hours. ------------------------------------------------------------------------------------------------------------------  Recent Labs  12/11/14 1305  TSH 1.000   ------------------------------------------------------------------------------------------------------------------ No results for input(s): VITAMINB12, FOLATE, FERRITIN, TIBC, IRON, RETICCTPCT in the last 72 hours.  Coagulation profile No results for input(s): INR, PROTIME in the last 168 hours.  No results for input(s): DDIMER in the last 72 hours.  Cardiac Enzymes  Recent Labs Lab 12/11/14 1305 12/11/14 1905 12/12/14 0042  TROPONINI 0.49* 0.57* 0.49*   ------------------------------------------------------------------------------------------------------------------ Invalid input(s): POCBNP   Time Spent in minutes 35   Mahamed Zalewski K M.D on 12/14/2014 at 10:10 AM  Between 7am to 7pm - Pager - [phone]  After 7pm go to [email or web address] - password [facility]  [facility] -  Office  [phone]

## 2014-12-15 ENCOUNTER — Encounter (HOSPITAL_COMMUNITY)
Admission: EM | Disposition: A | Discharge status: Skilled Nursing Facility | Payer: Self-pay | Source: Home / Self Care | Attending: Internal Medicine

## 2014-12-15 ENCOUNTER — Inpatient Hospital Stay (HOSPITAL_COMMUNITY): Payer: Medicare Other | Admitting: Anesthesiology

## 2014-12-15 ENCOUNTER — Encounter (HOSPITAL_COMMUNITY): Payer: Self-pay | Admitting: Anesthesiology

## 2014-12-15 ENCOUNTER — Inpatient Hospital Stay (HOSPITAL_COMMUNITY): Payer: Medicare Other

## 2014-12-15 DIAGNOSIS — I998 Other disorder of circulatory system: Secondary | ICD-10-CM

## 2014-12-15 DIAGNOSIS — R29898 Other symptoms and signs involving the musculoskeletal system: Secondary | ICD-10-CM

## 2014-12-15 HISTORY — PX: THROMBECTOMY BRACHIAL ARTERY: SHX6649

## 2014-12-15 LAB — CBC
HEMATOCRIT: 27.8 % — AB (ref 36.0–46.0)
HEMOGLOBIN: 9.4 g/dL — AB (ref 12.0–15.0)
MCH: 30.2 pg (ref 26.0–34.0)
MCHC: 33.8 g/dL (ref 30.0–36.0)
MCV: 89.4 fL (ref 78.0–100.0)
Platelets: 212 10*3/uL (ref 150–400)
RBC: 3.11 MIL/uL — ABNORMAL LOW (ref 3.87–5.11)
RDW: 12.5 % (ref 11.5–15.5)
WBC: 8.7 10*3/uL (ref 4.0–10.5)

## 2014-12-15 LAB — BASIC METABOLIC PANEL
Anion gap: 14 (ref 5–15)
BUN: 39 mg/dL — AB (ref 6–20)
CALCIUM: 9 mg/dL (ref 8.9–10.3)
CHLORIDE: 99 mmol/L — AB (ref 101–111)
CO2: 21 mmol/L — AB (ref 22–32)
CREATININE: 2.74 mg/dL — AB (ref 0.44–1.00)
GFR calc non Af Amer: 17 mL/min — ABNORMAL LOW (ref >60)
GFR, EST AFRICAN AMERICAN: 20 mL/min — AB (ref >60)
GLUCOSE: 273 mg/dL — AB (ref 65–99)
Potassium: 4.3 mmol/L (ref 3.5–5.1)
Sodium: 134 mmol/L — ABNORMAL LOW (ref 135–145)

## 2014-12-15 LAB — GLUCOSE, CAPILLARY
GLUCOSE-CAPILLARY: 257 mg/dL — AB (ref 65–99)
GLUCOSE-CAPILLARY: 186 mg/dL — AB (ref 65–99)
GLUCOSE-CAPILLARY: 144 mg/dL — AB (ref 65–99)
GLUCOSE-CAPILLARY: 146 mg/dL — AB (ref 65–99)
Glucose-Capillary: 230 mg/dL — ABNORMAL HIGH (ref 65–99)

## 2014-12-15 LAB — SURGICAL PCR SCREEN
MRSA, PCR: NEGATIVE
STAPHYLOCOCCUS AUREUS: NEGATIVE

## 2014-12-15 LAB — LACTIC ACID, PLASMA
LACTIC ACID, VENOUS: 1.5 mmol/L (ref 0.5–2.0)
Lactic Acid, Venous: 3.2 mmol/L (ref 0.5–2.0)

## 2014-12-15 LAB — PROTIME-INR
INR: 1.07 (ref 0.00–1.49)
Prothrombin Time: 14.1 seconds (ref 11.6–15.2)

## 2014-12-15 SURGERY — THROMBECTOMY, ARTERY, BRACHIAL
Anesthesia: General | Site: Arm Upper | Laterality: Left

## 2014-12-15 MED ORDER — SUCCINYLCHOLINE CHLORIDE 20 MG/ML IJ SOLN
INTRAMUSCULAR | Status: AC
  Filled 2014-12-15: qty 1

## 2014-12-15 MED ORDER — HEPARIN (PORCINE) IN NACL 100-0.45 UNIT/ML-% IJ SOLN
1200.0000 [IU]/h | INTRAMUSCULAR | Status: DC
  Administered 2014-12-15: 1200 [IU]/h via INTRAVENOUS
  Filled 2014-12-15: qty 250

## 2014-12-15 MED ORDER — MIDAZOLAM HCL 5 MG/5ML IJ SOLN
INTRAMUSCULAR | Status: DC | PRN
  Administered 2014-12-15 (×2): 1 mg via INTRAVENOUS

## 2014-12-15 MED ORDER — PAPAVERINE HCL 30 MG/ML IJ SOLN
INTRAMUSCULAR | Status: DC | PRN
  Administered 2014-12-15: 2 mL via INTRAVENOUS

## 2014-12-15 MED ORDER — MEPERIDINE HCL 25 MG/ML IJ SOLN: 6.2500 mg | INTRAMUSCULAR | Status: DC | PRN

## 2014-12-15 MED ORDER — LIDOCAINE HCL (PF) 1 % IJ SOLN
INTRAMUSCULAR | Status: AC
  Filled 2014-12-15: qty 30

## 2014-12-15 MED ORDER — HYDRALAZINE HCL 20 MG/ML IJ SOLN
10.0000 mg | Freq: Four times a day (QID) | INTRAMUSCULAR | Status: DC | PRN
  Administered 2014-12-15 – 2014-12-20 (×9): 10 mg via INTRAVENOUS
  Filled 2014-12-15 (×8): qty 1

## 2014-12-15 MED ORDER — ONDANSETRON HCL 4 MG/2ML IJ SOLN
INTRAMUSCULAR | Status: AC
  Filled 2014-12-15: qty 2

## 2014-12-15 MED ORDER — ARTIFICIAL TEARS OP OINT
TOPICAL_OINTMENT | OPHTHALMIC | Status: AC
  Filled 2014-12-15: qty 3.5

## 2014-12-15 MED ORDER — LIDOCAINE HCL (CARDIAC) 20 MG/ML IV SOLN
INTRAVENOUS | Status: AC
  Filled 2014-12-15: qty 15

## 2014-12-15 MED ORDER — FENTANYL CITRATE (PF) 250 MCG/5ML IJ SOLN
INTRAMUSCULAR | Status: DC | PRN
  Administered 2014-12-15: 100 ug via INTRAVENOUS

## 2014-12-15 MED ORDER — LACTATED RINGERS IV SOLN
INTRAVENOUS | Status: DC | PRN
  Administered 2014-12-15: 18:00:00 via INTRAVENOUS

## 2014-12-15 MED ORDER — HYDROMORPHONE HCL 1 MG/ML IJ SOLN
0.2500 mg | INTRAMUSCULAR | Status: DC | PRN
  Administered 2014-12-15 (×2): 0.5 mg via INTRAVENOUS

## 2014-12-15 MED ORDER — HEPARIN SODIUM (PORCINE) 1000 UNIT/ML IJ SOLN
INTRAMUSCULAR | Status: AC
  Filled 2014-12-15: qty 2

## 2014-12-15 MED ORDER — VANCOMYCIN HCL 10 G IV SOLR
1500.0000 mg | INTRAVENOUS | Status: DC
  Administered 2014-12-15: 1500 mg via INTRAVENOUS
  Filled 2014-12-15: qty 1500

## 2014-12-15 MED ORDER — FENTANYL CITRATE (PF) 250 MCG/5ML IJ SOLN
INTRAMUSCULAR | Status: AC
  Filled 2014-12-15: qty 5

## 2014-12-15 MED ORDER — LIDOCAINE HCL (CARDIAC) 20 MG/ML IV SOLN
INTRAVENOUS | Status: AC
  Filled 2014-12-15: qty 5

## 2014-12-15 MED ORDER — LIDOCAINE-EPINEPHRINE (PF) 1 %-1:200000 IJ SOLN
INTRAMUSCULAR | Status: AC
  Filled 2014-12-15: qty 30

## 2014-12-15 MED ORDER — THROMBIN 20000 UNITS EX SOLR
CUTANEOUS | Status: AC
  Filled 2014-12-15: qty 20000

## 2014-12-15 MED ORDER — MIDAZOLAM HCL 2 MG/2ML IJ SOLN
INTRAMUSCULAR | Status: AC
  Filled 2014-12-15: qty 2

## 2014-12-15 MED ORDER — HEPARIN (PORCINE) IN NACL 100-0.45 UNIT/ML-% IJ SOLN
1300.0000 [IU]/h | INTRAMUSCULAR | Status: DC
  Administered 2014-12-16: 1300 [IU]/h via INTRAVENOUS
  Filled 2014-12-15: qty 250

## 2014-12-15 MED ORDER — CEFAZOLIN SODIUM-DEXTROSE 2-3 GM-% IV SOLR
INTRAVENOUS | Status: AC
  Filled 2014-12-15: qty 100

## 2014-12-15 MED ORDER — HEPARIN BOLUS VIA INFUSION
4000.0000 [IU] | Freq: Once | INTRAVENOUS | Status: AC
  Administered 2014-12-15: 4000 [IU] via INTRAVENOUS
  Filled 2014-12-15: qty 4000

## 2014-12-15 MED ORDER — HYDROMORPHONE HCL 1 MG/ML IJ SOLN
INTRAMUSCULAR | Status: AC
  Filled 2014-12-15: qty 1

## 2014-12-15 MED ORDER — CEFAZOLIN SODIUM-DEXTROSE 2-3 GM-% IV SOLR
INTRAVENOUS | Status: DC | PRN
  Administered 2014-12-15: 2 g via INTRAVENOUS

## 2014-12-15 MED ORDER — SUCCINYLCHOLINE CHLORIDE 20 MG/ML IJ SOLN
INTRAMUSCULAR | Status: AC
  Filled 2014-12-15: qty 2

## 2014-12-15 MED ORDER — PROPOFOL 10 MG/ML IV BOLUS
INTRAVENOUS | Status: DC | PRN
  Administered 2014-12-15: 200 mg via INTRAVENOUS

## 2014-12-15 MED ORDER — SODIUM CHLORIDE 0.9 % IV SOLN
INTRAVENOUS | Status: DC | PRN
  Administered 2014-12-15: 500 mL

## 2014-12-15 MED ORDER — HEPARIN SODIUM (PORCINE) 1000 UNIT/ML IJ SOLN
INTRAMUSCULAR | Status: DC | PRN
  Administered 2014-12-15: 8000 [IU] via INTRAVENOUS

## 2014-12-15 MED ORDER — LIDOCAINE HCL (CARDIAC) 20 MG/ML IV SOLN
INTRAVENOUS | Status: DC | PRN
  Administered 2014-12-15: 50 mg via INTRAVENOUS

## 2014-12-15 MED ORDER — 0.9 % SODIUM CHLORIDE (POUR BTL) OPTIME
TOPICAL | Status: DC | PRN
  Administered 2014-12-15: 1000 mL

## 2014-12-15 MED ORDER — PAPAVERINE HCL 30 MG/ML IJ SOLN
INTRAMUSCULAR | Status: AC
  Filled 2014-12-15: qty 2

## 2014-12-15 MED ORDER — HYDRALAZINE HCL 20 MG/ML IJ SOLN
INTRAMUSCULAR | Status: AC
  Filled 2014-12-15: qty 1

## 2014-12-15 MED ORDER — PROPOFOL 10 MG/ML IV BOLUS
INTRAVENOUS | Status: AC
  Filled 2014-12-15: qty 20

## 2014-12-15 MED ORDER — METOPROLOL TARTRATE 1 MG/ML IV SOLN
5.0000 mg | INTRAVENOUS | Status: DC | PRN
  Administered 2014-12-16: 5 mg via INTRAVENOUS
  Filled 2014-12-15 (×2): qty 5

## 2014-12-15 MED ORDER — PHENYLEPHRINE 40 MCG/ML (10ML) SYRINGE FOR IV PUSH (FOR BLOOD PRESSURE SUPPORT)
PREFILLED_SYRINGE | INTRAVENOUS | Status: AC
  Filled 2014-12-15: qty 20

## 2014-12-15 MED ORDER — SODIUM CHLORIDE 0.9 % IV SOLN
INTRAVENOUS | Status: DC
  Administered 2014-12-15: 21:00:00 via INTRAVENOUS

## 2014-12-15 MED ORDER — CEFAZOLIN SODIUM 1-5 GM-% IV SOLN
1.0000 g | INTRAVENOUS | Status: DC
  Filled 2014-12-15: qty 50

## 2014-12-15 MED ORDER — OXYCODONE HCL 5 MG PO TABS: 5.0000 mg | ORAL_TABLET | Freq: Once | ORAL | Status: DC | PRN

## 2014-12-15 MED ORDER — STERILE WATER FOR INJECTION IJ SOLN
INTRAMUSCULAR | Status: AC
  Filled 2014-12-15: qty 20

## 2014-12-15 MED ORDER — OXYCODONE HCL 5 MG/5ML PO SOLN: 5.0000 mg | Freq: Once | ORAL | Status: DC | PRN

## 2014-12-15 SURGICAL SUPPLY — 46 items
ARMBAND PINK RESTRICT EXTREMIT (MISCELLANEOUS) ×3 IMPLANT
BANDAGE ELASTIC 4 VELCRO ST LF (GAUZE/BANDAGES/DRESSINGS) ×3 IMPLANT
CANISTER SUCTION 2500CC (MISCELLANEOUS) ×3 IMPLANT
CANNULA VESSEL 3MM 2 BLNT TIP (CANNULA) ×6 IMPLANT
CATH EMB 3FR 80CM (CATHETERS) ×3 IMPLANT
CATH EMB 4FR 80CM (CATHETERS) IMPLANT
CLIP TI MEDIUM 6 (CLIP) ×3 IMPLANT
CLIP TI WIDE RED SMALL 24 (CLIP) ×3 IMPLANT
CLIP TI WIDE RED SMALL 6 (CLIP) ×6 IMPLANT
DERMABOND ADVANCED (GAUZE/BANDAGES/DRESSINGS) ×2
DERMABOND ADVANCED .7 DNX12 (GAUZE/BANDAGES/DRESSINGS) ×1 IMPLANT
DRAPE X-RAY CASS 24X20 (DRAPES) IMPLANT
ELECT REM PT RETURN 9FT ADLT (ELECTROSURGICAL) ×3
ELECTRODE REM PT RTRN 9FT ADLT (ELECTROSURGICAL) ×1 IMPLANT
GAUZE SPONGE 4X4 16PLY XRAY LF (GAUZE/BANDAGES/DRESSINGS) IMPLANT
GEL ULTRASOUND 20GR AQUASONIC (MISCELLANEOUS) IMPLANT
GLOVE BIO SURGEON STRL SZ 6.5 (GLOVE) ×2 IMPLANT
GLOVE BIO SURGEON STRL SZ7.5 (GLOVE) ×3 IMPLANT
GLOVE BIO SURGEONS STRL SZ 6.5 (GLOVE) ×1
GLOVE BIOGEL PI IND STRL 7.5 (GLOVE) ×1 IMPLANT
GLOVE BIOGEL PI IND STRL 8 (GLOVE) ×1 IMPLANT
GLOVE BIOGEL PI INDICATOR 7.5 (GLOVE) ×2
GLOVE BIOGEL PI INDICATOR 8 (GLOVE) ×2
GLOVE SURG SS PI 7.5 STRL IVOR (GLOVE) ×3 IMPLANT
GOWN STRL REUS W/ TWL LRG LVL3 (GOWN DISPOSABLE) ×3 IMPLANT
GOWN STRL REUS W/TWL LRG LVL3 (GOWN DISPOSABLE) ×6
KIT BASIN OR (CUSTOM PROCEDURE TRAY) ×3 IMPLANT
KIT ROOM TURNOVER OR (KITS) ×3 IMPLANT
LIQUID BAND (GAUZE/BANDAGES/DRESSINGS) ×3 IMPLANT
LOOP VESSEL MINI RED (MISCELLANEOUS) ×3 IMPLANT
NS IRRIG 1000ML POUR BTL (IV SOLUTION) ×3 IMPLANT
PACK CV ACCESS (CUSTOM PROCEDURE TRAY) ×3 IMPLANT
PAD ARMBOARD 7.5X6 YLW CONV (MISCELLANEOUS) ×6 IMPLANT
SET COLLECT BLD 21X3/4 12 (NEEDLE) IMPLANT
SPONGE SURGIFOAM ABS GEL 100 (HEMOSTASIS) IMPLANT
STOPCOCK 4 WAY LG BORE MALE ST (IV SETS) IMPLANT
SUCTION FRAZIER TIP 10 FR DISP (SUCTIONS) IMPLANT
SUT PROLENE 6 0 BV (SUTURE) ×3 IMPLANT
SUT SILK 3 0 (SUTURE) ×2
SUT SILK 3-0 18XBRD TIE 12 (SUTURE) ×1 IMPLANT
SUT VIC AB 3-0 SH 27 (SUTURE) ×2
SUT VIC AB 3-0 SH 27X BRD (SUTURE) ×1 IMPLANT
SUT VICRYL 4-0 PS2 18IN ABS (SUTURE) ×3 IMPLANT
TUBING EXTENTION W/L.L. (IV SETS) IMPLANT
UNDERPAD 30X30 INCONTINENT (UNDERPADS AND DIAPERS) ×3 IMPLANT
WATER STERILE IRR 1000ML POUR (IV SOLUTION) ×3 IMPLANT

## 2014-12-15 NOTE — Progress Notes (Addendum)
ANTICOAGULATION CONSULT NOTE - Follow Up Consult  Pharmacy Consult for heparin Indication: left arm arterial thrombus  Allergies  Allergen Reactions  . Codeine Nausea And Vomiting    Patient Measurements: Height: 5\' 2"  (157.5 cm) Weight: 239 lb 1.6 oz (108.455 kg) IBW/kg (Calculated) : 50.1 Heparin Dosing Weight: 76.5 kg  Vital Signs: Temp: 98.4 F (36.9 C) (11/20 0419) BP: 179/53 mmHg (11/20 0636) Pulse Rate: 88 (11/20 0419)  Labs:  Recent Labs  12/13/14 0612 12/15/14 0800  HGB 8.9* 9.4*  HCT 25.8* 27.8*  PLT 162 212  LABPROT  --  14.1  INR  --  1.07  CREATININE 3.35*  --     Estimated Creatinine Clearance: 18.9 mL/min (by C-G formula based on Cr of 3.35).  Assessment:  69 y/o F with N/V and left arm numbness. Pharmacy consulted to start heparin for left arm arterial thrombus.   Goal of Therapy:  Heparin level 0.3-0.7 units/ml Monitor platelets by anticoagulation protocol: Yes   Plan:  Heparin IV bolus 4000u x 1, then heparin IV 1200 units/hr 8 hr HL Daily HL, CBC Monitor for S&S of bleed  Angela Burke, PharmD Pharmacy Resident Pager: [phone] 12/15/2014,8:59 AM  ____________________________________________________ ADDENDUM:  Pharmacy consulted to dose vancomycin for GPC in blood culture. Pt wt has increased and renal function slightly improved. Will increase vancomycin to 1500mg  q48hr. Will continue to closely monitor renal function, and cultures. Obtain vanc trough at steady state.

## 2014-12-15 NOTE — Consult Note (Addendum)
[facility] [place]  Patient name: [name] MRN: [id number] DOB: 01-05-1946 Sex: female  REASON FOR CONSULT: Ischemic left arm.  HPI: [name] is a 69 y.o. female, who reportedly developed left arm numbness starting at 5:30 this morning. She was noted to have a cold left hand and vascular surgery was consult.  She was admitted on 12/11/2014 with nausea and vomiting. She has a complicated medical history significant for stage IV chronic kidney disease, diabetes, hypertension, and morbid obesity. She denies any previous history of dizziness or left arm pain. She had significant bradycardia on admission and this has improved. She has had elevated troponin levels during this admission and is followed by cardiology. In addition she had acute on chronic renal failure.  Past Medical History  Diagnosis Date  . Essential hypertension   . CKD (chronic kidney disease), stage IV ([place])   . Hyperlipidemia   . GERD (gastroesophageal reflux disease)   . Morbid obesity ([place])   . Depression   . CHF (congestive heart failure) ([place])   . Type II diabetes mellitus ([place])   . Anemia   . Seizures ([place])     "epilepsy; they think I had one a couple months ago" (12/11/2014)    Family History  Problem Relation Age of Onset  . Hypertension Mother     died @ 48 - unknown cause.  [name] Hypertension Father     says she doesn't know anything about her father.  . Cancer Maternal Aunt   . Seizures Neg Hx     SOCIAL HISTORY: Social History   Social History  . Marital Status: Divorced    Spouse Name: N/A  . Number of Children: 2  . Years of Education: 10   Occupational History  . Retired    Social History Main Topics  . Smoking status: Passive Smoke Exposure - Never Smoker  . Smokeless tobacco: Never Used     Comment: EXPOSED TO 2ND HAND SMOKE X 20+ YEARS.   [name] Alcohol Use: No  . Drug Use: No  . Sexual Activity: No   Other Topics Concern  . Not on file   Social  History Narrative   Lives in [place] by herself.  Daughters nearby.  Sedentary.   Eats fast food for at least one meal almost daily.   Caffeine use:  Drinks soda daily.   Coffee rarely.     Allergies  Allergen Reactions  . Codeine Nausea And Vomiting    Current Facility-Administered Medications  Medication Dose Route Frequency Provider Last Rate Last Dose  . acetaminophen (TYLENOL) tablet 650 mg  650 mg Oral Q6H PRN Melton Alar, PA-C   650 mg at 12/15/14 0418  . albuterol (PROVENTIL) (2.5 MG/3ML) 0.083% nebulizer solution 2.5 mg  2.5 mg Nebulization Q4H PRN Thurnell Lose, MD      . amLODipine (NORVASC) tablet 10 mg  10 mg Oral Daily Melton Alar, PA-C   10 mg at 12/15/14 0804  . antiseptic oral rinse (CPC / CETYLPYRIDINIUM CHLORIDE 0.05%) solution 7 mL  7 mL Mouth Rinse BID Caren Griffins, MD   7 mL at 12/15/14 0810  . aspirin chewable tablet 81 mg  81 mg Oral Daily Rogelia Mire, NP   81 mg at 12/15/14 0804  . carbamazepine (TEGRETOL) tablet 400 mg  400 mg Oral BID Melton Alar, PA-C   400 mg at 12/14/14 2113  . cefTRIAXone (ROCEPHIN) 1 g in dextrose 5 %  50 mL IVPB  1 g Intravenous Q24H Thurnell Lose, MD   1 g at 12/14/14 0818  . citalopram (CELEXA) tablet 20 mg  20 mg Oral Daily Melton Alar, PA-C   20 mg at 12/15/14 [id number]  . clobetasol cream (TEMOVATE) [id number] % 1 application  1 application Topical BID PRN Melton Alar, PA-C      . docusate sodium (COLACE) capsule 200 mg  200 mg Oral BID Thurnell Lose, MD   200 mg at 12/15/14 0805  . ezetimibe-simvastatin (VYTORIN) 10-40 MG per tablet 1 tablet  1 tablet Oral QHS Melton Alar, PA-C   1 tablet at 12/14/14 2113  . fluconazole (DIFLUCAN) tablet 100 mg  100 mg Oral Daily Melton Alar, PA-C   100 mg at 12/15/14 0805  . furosemide (LASIX) tablet 40 mg  40 mg Oral BID Thurnell Lose, MD   40 mg at 12/15/14 0805  . hydrALAZINE (APRESOLINE) injection 10 mg  10 mg Intravenous Q6H PRN Melton Alar, PA-C   10 mg  at 12/15/14 0419  . hydrALAZINE (APRESOLINE) tablet 50 mg  50 mg Oral 3 times per day Thurnell Lose, MD   50 mg at 12/15/14 0636  . Influenza vac split quadrivalent PF (FLUARIX) injection 0.5 mL  0.5 mL Intramuscular Tomorrow-1000 [name], MD      . insulin aspart (novoLOG) injection 0-15 Units  0-15 Units Subcutaneous TID WC Melton Alar, PA-C   5 Units at 12/15/14 0804  . insulin aspart (novoLOG) injection 4 Units  4 Units Subcutaneous TID WC Melton Alar, PA-C   4 Units at 12/14/14 1733  . insulin glargine (LANTUS) injection 15 Units  15 Units Subcutaneous QHS Melton Alar, PA-C   15 Units at 12/14/14 2117  . lacosamide (VIMPAT) tablet 100 mg  100 mg Oral BID Caren Griffins, MD   100 mg at 12/15/14 0805  . nitroGLYCERIN (NITROGLYN) 2 % ointment 0.5 inch  0.5 inch Topical 4 times per day Thurnell Lose, MD   0.5 inch at 12/13/14 1807  . ondansetron (ZOFRAN) injection 4 mg  4 mg Intravenous Q6H PRN Caren Griffins, MD   4 mg at 12/13/14 2141  . pantoprazole (PROTONIX) EC tablet 40 mg  40 mg Oral Daily Melton Alar, PA-C   40 mg at 12/15/14 0805  . pneumococcal 23 valent vaccine (PNU-IMMUNE) injection 0.5 mL  0.5 mL Intramuscular Tomorrow-1000 [name], MD      . senna-docusate (Senokot-S) tablet 1 tablet  1 tablet Oral QHS PRN Melton Alar, PA-C      . vancomycin (VANCOCIN) IVPB 1000 mg/200 mL premix  1,000 mg Intravenous Q48H Gay Filler Carney, RPH   1,000 mg at 12/13/14 2142    REVIEW OF SYSTEMS:  [X]  denotes positive finding, [ ]  denotes negative finding Cardiac  Comments:  Chest pain or chest pressure:    Shortness of breath upon exertion: X   Short of breath when lying flat:    Irregular heart rhythm:        Vascular    Pain in calf, thigh, or hip brought on by ambulation:    Pain in feet at night that wakes you up from your sleep:     Blood clot in your veins:    Leg swelling:         Pulmonary    Oxygen at home:    Productive cough:  Wheezing:         Neurologic    Sudden weakness in arms or legs:  X Left arm  Sudden numbness in arms or legs:  X Left arm  Sudden onset of difficulty speaking or slurred speech:    Temporary loss of vision in one eye:     Problems with dizziness:         Gastrointestinal    Blood in stool:     Vomited blood:         Genitourinary    Burning when urinating:     Blood in urine:        Psychiatric    Major depression:         Hematologic    Bleeding problems:    Problems with blood clotting too easily:        Skin    Rashes or ulcers:        Constitutional    Fever or chills:      PHYSICAL EXAM: Filed Vitals:   12/14/14 2100 12/14/14 2323 12/15/14 0419 12/15/14 0636  BP: 196/63 176/51 187/46 179/53  Pulse: 75  88   Temp: 98.3 F (36.8 C)  98.4 F (36.9 C)   TempSrc:      Resp: 16  17   Height:      Weight:   239 lb 1.6 oz (108.455 kg)   SpO2: 98%  100%     GENERAL: The patient is a well-nourished female, in no acute distress. The vital signs are documented above. CARDIAC: There is a regular rate and rhythm.  VASCULAR: No doppler flow left hand. The left hand is slightly cooler than the right but does have reasonable temperature. PULMONARY: There is good air exchange bilaterally without wheezing or rales. ABDOMEN: Soft and non-tender with normal pitched bowel sounds.  MUSCULOSKELETAL: There are no major deformities or cyanosis. NEUROLOGIC: She has some paresthesias to the left hand and slight weakness. SKIN: Currently, the hand is not grossly mottled. PSYCHIATRIC: The patient has a normal affect.  DATA:   UPPER EXTREMITY ARTERIAL DUPLEX: I have independently interinterpreted the duplex scan which shows no arterial flow visualized throughout the left upper extremity. This included the left subclavian, axillary, brachial, radial, and ulnar arteries.  MEDICAL ISSUES:  ACUTE ARTERIAL OCCLUSION LEFT UPPER EXTREMITY: I suspect that she had a chronic left  subclavain artery stenosis which acutely occluded. Normally would want an angio or CT angiogram to evaluate, but given her CKD this could put her on HD. I would recommend evaluation by nephrology so that we can determine if she might potentially be a candidate for an arteriogram. She has no reason to have an acute embolus and this would typically lodge at the bifurcation of the brachial artery. She is now on heparin. If the arm does not improve, then I would need to consider surgical thrombectomy although based on the history I suspect she has a proximal subclavian stenosis which we would not be able to address intraoperatively. She has multiple medical comorbidities and would be at high-risk for surgery which would potentially require an axilloaxillary bypass or a carotid subclavian bypass. I will follow closely. Currently motor and sensory function appears to be intact in the left hand and she is getting some perfusion to the left hand as there is reasonable temperature.  Deitra Mayo [facility] [place]: [phone]

## 2014-12-15 NOTE — Progress Notes (Signed)
Dr. Candiss Norse notified of no blood flow to left upper extremity per Vascular Lab tech.  Primary Nurse Cyril Mourning at bedside.  Sanda Linger

## 2014-12-15 NOTE — Progress Notes (Signed)
D; go back to [facility] where she came per Dr. Scot Dock

## 2014-12-15 NOTE — Progress Notes (Signed)
Patient Demographics:    [name], is a 69 y.o. female, DOB - 1946-01-23, [id number]  Admit date - 12/11/2014   Admitting Physician [name], MD  Outpatient Primary MD for the patient is [name],[name], MD  LOS - 4   Chief Complaint  Patient presents with  . Emesis  . Bradycardia        Subjective:    [name] today has, No headache, No chest pain, No abdominal pain - No Nausea, No new weakness tingling or numbness, No Cough - SOB.  Planing of left arm pain and numbness since 5:30 AM this morning.   Assessment  & Plan :     1.N&V - most likely due to underlying constipation, KUB shows stool burden but no obstruction, place on bowel regimen and monitor. Currently nausea free. As needed IV Zofran. Initiated bowel regimen for constipation. Now symptom free and feels better.   2. Bradycardia upon admission. Medication related, beta blocker and clonidine held, heart rate better. TSH stable. Echo unremarkable. Cardiology on board and has signed off. Currently in sinus. Blood pressure stable.   3. ARF and CK D stage IV. Baseline creatinine close to 2.2, likely due to nausea vomiting dehydration, has been gently hydrated, will resume low-dose diuretics now has creatinine has trended Woods improvement.   4. UTI. Placed on Rocephin. Given a dose of fluconazole. Stable Renal US.   5. Essential hypertension. Currently on Norvasc and hydralazine continued.   6. Chronic diastolic dysfunction with EF 55-60%. Clinically compensated monitor of diuretic. Mild troponin elevation not an ACS pattern, EKG nonacute, on aspirin, cannot use beta blocker due to bradycardia, no further workup per cardiology who has seen the patient. Chest pain-free.   7. 1 out of 2 blood cultures positive for  gram-positive cocci, likely contamination. Monitor. Has received 1 dose of vancomycin on 12/13/2014 night.   8. Left arm numbness started 5:30 AM 12/15/2014. On exam, appears cold with weak radial pulse, stat arterial duplex obtained with evidence of loss of blood flow, heparin drip started, continue aspirin, have already discussed with vascular surgeon Dr. Scot Dock who will see the patient shortly.    Code Status : Full  Family Communication  : none  Disposition Plan  : HHPT  Consults  : Cards, vascular surgery  Procedures  :   TTE  Left ventricle: The cavity size was normal. Wall thickness wasincreased in a pattern of moderate LVH. Systolic function wasnormal. The estimated ejection fraction was in the range of 55%to 60%. Wall motion was normal; there were no regional wallmotion abnormalities. - Aortic valve: Valve area (VTI): 1.97 cm^2. Valve area (Vmax): 1.77 cm^2. Valve area (Vmean): 2.09 cm^2. - Left atrium: The atrium was mildly dilated.  Vascular Ultrasound  Upper Extremity Arterial Duplex has been completed. Preliminary findings: Left = No arterial flow visualized throughout left upper extremity. The left subclavian, axillary, brachial, radial, and ulnar arteries were evaluated.  Right subclavian artery was compared and demonstrates biphasic flow.    Renal US - Stable   DVT Prophylaxis  :   Heparin    Lab Results  Component Value Date   PLT 212 12/15/2014    Inpatient Medications  Scheduled Meds: . amLODipine  10 mg Oral Daily  .  antiseptic oral rinse  7 mL Mouth Rinse BID  . aspirin  81 mg Oral Daily  . carbamazepine  400 mg Oral BID  . cefTRIAXone (ROCEPHIN)  IV  1 g Intravenous Q24H  . citalopram  20 mg Oral Daily  . docusate sodium  200 mg Oral BID  . ezetimibe-simvastatin  1 tablet Oral QHS  . fluconazole  100 mg Oral Daily  . furosemide  40 mg Oral BID  . hydrALAZINE  50 mg Oral 3 times per day  . Influenza vac split quadrivalent PF  0.5 mL  Intramuscular Tomorrow-1000  . insulin aspart  0-15 Units Subcutaneous TID WC  . insulin aspart  4 Units Subcutaneous TID WC  . insulin glargine  15 Units Subcutaneous QHS  . lacosamide  100 mg Oral BID  . nitroGLYCERIN  0.5 inch Topical 4 times per day  . pantoprazole  40 mg Oral Daily  . pneumococcal 23 valent vaccine  0.5 mL Intramuscular Tomorrow-1000  . vancomycin  1,000 mg Intravenous Q48H   Continuous Infusions:  PRN Meds:.acetaminophen **OR** [DISCONTINUED] acetaminophen, albuterol, clobetasol cream, hydrALAZINE, ondansetron (ZOFRAN) IV, senna-docusate  Antibiotics  :     Anti-infectives    Start     Dose/Rate Route Frequency Ordered Stop   12/13/14 2045  vancomycin (VANCOCIN) IVPB 1000 mg/200 mL premix     1,000 mg 200 mL/hr over 60 Minutes Intravenous Every 48 hours 12/13/14 2031     12/12/14 0800  cefTRIAXone (ROCEPHIN) 1 g in dextrose 5 % 50 mL IVPB     1 g 100 mL/hr over 30 Minutes Intravenous Every 24 hours 12/12/14 0725     12/11/14 1230  fluconazole (DIFLUCAN) tablet 100 mg     100 mg Oral Daily 12/11/14 1222          Objective:   Filed Vitals:   12/14/14 2100 12/14/14 2323 12/15/14 0419 12/15/14 0636  BP: 196/63 176/51 187/46 179/53  Pulse: 75  88   Temp: 98.3 F (36.8 C)  98.4 F (36.9 C)   TempSrc:      Resp: 16  17   Height:      Weight:   108.455 kg (239 lb 1.6 oz)   SpO2: 98%  100%     Wt Readings from Last 3 Encounters:  12/15/14 108.455 kg (239 lb 1.6 oz)  11/22/14 109.68 kg (241 lb 12.8 oz)  07/24/14 113.445 kg (250 lb 1.6 oz)     Intake/Output Summary (Last 24 hours) at 12/15/14 0850 Last data filed at 12/15/14 0831  Gross per 24 hour  Intake    660 ml  Output   1750 ml  Net  -1090 ml     Physical Exam  Awake Alert, Oriented X 3, No new F.N deficits, Normal affect .AT,PERRAL Supple Neck,No JVD, No cervical lymphadenopathy appriciated.  Symmetrical Chest wall movement, Good air movement bilaterally, CTAB RRR,No Gallops,Rubs  or new Murmurs, No Parasternal Heave +ve B.Sounds, Abd Soft, No tenderness, No organomegaly appriciated, No rebound - guarding or rigidity. No Cyanosis, Clubbing or edema, No new Rash or bruise,   left arm is appears colder as compared to right with weak radial pulse       Data Review:   Micro Results Recent Results (from the past 240 hour(s))  Urine culture     Status: None   Collection Time: 12/11/14  8:58 AM  Result Value Ref Range Status   Specimen Description URINE, RANDOM  Final   Special Requests NONE  Final   Culture MULTIPLE SPECIES PRESENT, SUGGEST RECOLLECTION  Final   Report Status 12/13/2014 FINAL  Final  Culture, blood (routine x 2)     Status: None (Preliminary result)   Collection Time: 12/12/14  4:50 PM  Result Value Ref Range Status   Specimen Description BLOOD RIGHT ARM  Final   Special Requests IN PEDIATRIC BOTTLE [place]  Final   Culture  Setup Time   Final    GRAM POSITIVE COCCI IN CLUSTERS AEROBIC BOTTLE ONLY CRITICAL RESULT CALLED TO, READ BACK BY AND VERIFIED WITH: Dolores Hoose RN 2014 12/13/14 A BROWNING    Culture TOO YOUNG TO READ  Final   Report Status PENDING  Incomplete  Culture, blood (routine x 2)     Status: None (Preliminary result)   Collection Time: 12/12/14  4:59 PM  Result Value Ref Range Status   Specimen Description BLOOD RIGHT ANTECUBITAL  Final   Special Requests BOTTLES DRAWN AEROBIC AND ANAEROBIC 5CC  Final   Culture NO GROWTH 2 DAYS  Final   Report Status PENDING  Incomplete    Radiology Reports Dg Chest 2 View  12/14/2014  CLINICAL DATA:  Patient with shortness of breath. EXAM: CHEST  2 VIEW COMPARISON:  Chest radiograph 12/13/2014 FINDINGS: Monitoring leads overlie the patient. Stable enlarged cardiac and mediastinal contours. Elevation of the right hemidiaphragm. No consolidative pulmonary opacities. No pleural effusion or pneumothorax. Regional skeleton is unremarkable. IMPRESSION: No acute cardiopulmonary process. Cardiomegaly.  Electronically Signed   By: Lovey Newcomer M.D.   On: 12/14/2014 14:43   Dg Chest 2 View  12/11/2014  CLINICAL DATA:  Bradycardia and vomiting since yesterday. EXAM: CHEST  2 VIEW COMPARISON:  None. FINDINGS: Enlarged cardiac silhouette similar to prior. Exam is lordotic which exaggerates the cardiac silhouette. Interval increase in central venous congestion. Low lung volumes. No pleural fluid. IMPRESSION: Cardiomegaly and increased central venous congestion. Electronically Signed   By: Suzy Bouchard M.D.   On: 12/11/2014 09:31   US Renal  12/12/2014  CLINICAL DATA:  Acute renal failure EXAM: RENAL / URINARY TRACT ULTRASOUND COMPLETE COMPARISON:  None. FINDINGS: Right Kidney: Length: 9.7 cm. Echogenicity within normal limits. No mass or hydronephrosis visualized. Left Kidney: Length: 9.2 cm. Echogenicity within normal limits. No mass or hydronephrosis visualized. Bladder: Decompressed following voiding IMPRESSION: No acute abnormality noted. Electronically Signed   By: Inez Catalina M.D.   On: 12/12/2014 16:56   Dg Chest Port 1 View  12/13/2014  CLINICAL DATA:  Shortness of breath. EXAM: PORTABLE CHEST 1 VIEW COMPARISON:  December 12, 2014 FINDINGS: Stable mild cardiomegaly is noted with mild central pulmonary vascular congestion. No pneumothorax or pleural effusion is noted. Both lungs are clear. Osteophyte formation is noted in mid thoracic spine. IMPRESSION: Stable mild cardiomegaly with central pulmonary vascular congestion. No significant change compared to prior exam. Electronically Signed   By: Marijo Conception, M.D.   On: 12/13/2014 07:55   Dg Chest Port 1 View  12/12/2014  CLINICAL DATA:  Shortness of breath for 2 days EXAM: PORTABLE CHEST 1 VIEW COMPARISON:  12/11/2014 FINDINGS: Cardiomegaly again noted. Mild thoracic dextroscoliosis. Degenerative changes thoracic spine. Central mild vascular congestion without pulmonary edema. No focal infiltrate IMPRESSION: Cardiomegaly. Mild thoracic  dextroscoliosis. Central vascular congestion without convincing pulmonary edema. No segmental infiltrate. Electronically Signed   By: Lahoma Crocker M.D.   On: 12/12/2014 08:00   Dg Abd Portable 1v  12/12/2014  CLINICAL DATA:  Nausea EXAM: PORTABLE ABDOMEN - 1 VIEW COMPARISON:  CT abdomen 08/06/2009 FINDINGS: Retained stool in the colon. Negative for bowel obstruction. No renal calculi. Lumbar degenerative change at L4-5 and L5-S1. No acute skeletal abnormality IMPRESSION: Constipation without bowel obstruction. Electronically Signed   By: Franchot Gallo M.D.   On: 12/12/2014 10:28     CBC  Recent Labs Lab 12/11/14 0835 12/11/14 1305 12/12/14 0042 12/13/14 0612 12/15/14 0800  WBC 7.1 7.3 9.4 7.3 8.7  HGB 10.2* 10.4* 9.6* 8.9* 9.4*  HCT 30.4* 30.6* 27.6* 25.8* 27.8*  PLT 197 202 176 162 212  MCV 89.9 89.5 89.6 89.6 89.4  MCH 30.2 30.4 31.2 30.9 30.2  MCHC 33.6 34.0 34.8 34.5 33.8  RDW 12.2 12.2 12.5 12.5 12.5  LYMPHSABS 0.9  --   --   --   --   MONOABS 0.9  --   --   --   --   EOSABS 0.0  --   --   --   --   BASOSABS 0.0  --   --   --   --     Chemistries   Recent Labs Lab 12/11/14 0835 12/11/14 1305 12/12/14 0042 12/13/14 0612  NA 137  --  135 137  K 4.5  --  4.1 4.3  CL 103  --  102 103  CO2 24  --  21* 25  GLUCOSE 202*  --  132* 92  BUN 58*  --  60* 53*  CREATININE 4.02* 3.74* 3.66* 3.35*  CALCIUM 8.6*  --  8.3* 8.3*  AST 20  --   --   --   ALT 15  --   --   --   ALKPHOS 95  --   --   --   BILITOT 0.7  --   --   --    ------------------------------------------------------------------------------------------------------------------ estimated creatinine clearance is 18.9 mL/min (by C-G formula based on Cr of 3.35). ------------------------------------------------------------------------------------------------------------------ No results for input(s): HGBA1C in the last 72  hours. ------------------------------------------------------------------------------------------------------------------ No results for input(s): CHOL, HDL, LDLCALC, TRIG, CHOLHDL, LDLDIRECT in the last 72 hours. ------------------------------------------------------------------------------------------------------------------ No results for input(s): TSH, T4TOTAL, T3FREE, THYROIDAB in the last 72 hours.  Invalid input(s): FREET3 ------------------------------------------------------------------------------------------------------------------ No results for input(s): VITAMINB12, FOLATE, FERRITIN, TIBC, IRON, RETICCTPCT in the last 72 hours.  Coagulation profile No results for input(s): INR, PROTIME in the last 168 hours.  No results for input(s): DDIMER in the last 72 hours.  Cardiac Enzymes  Recent Labs Lab 12/11/14 1305 12/11/14 1905 12/12/14 0042  TROPONINI 0.49* 0.57* 0.49*   ------------------------------------------------------------------------------------------------------------------ Invalid input(s): POCBNP   Time Spent in minutes 35   SINGH,PRASHANT K M.D on 12/15/2014 at 8:50 AM  Between 7am to 7pm - Pager - [phone]  After 7pm go to [email or web address] - password [facility]  [facility] -  Office  [phone]

## 2014-12-15 NOTE — Progress Notes (Signed)
VASCULAR SURGERY FOLLOW UP  I reexamined the patient this evening after getting out of the operating room. The left hand still does not have Doppler signals in the radial and ulnar positions. She does have a monophasic brachial signal. She has been on heparin. She has decreased motor function with decreased grip and decreased sensation. This reason I have recommended emergent attempted brachial embolectomy. Unfortunately, I suspect that she has a proximal subclavian stenosis although because of her renal failure I have been unable to evaluate with arteriography or CT angiogram. However given the lack of improvement in the hand I have recommended attempted brachial embolectomy and a discussed this with the patient. She is agreeable to proceed.  Deitra Mayo, MD, [place] [phone] Office: [phone]

## 2014-12-15 NOTE — Anesthesia Preprocedure Evaluation (Addendum)
Anesthesia Evaluation  Patient identified by MRN, date of birth, ID band Patient awake    Reviewed: Allergy & Precautions, NPO status , Patient's Chart, lab work & pertinent test results  Airway Mallampati: II  TM Distance: >3 FB Neck ROM: Full    Dental  (+) Teeth Intact, Partial Lower, Dental Advisory Given   Pulmonary sleep apnea ,    breath sounds clear to auscultation       Cardiovascular hypertension, Pt. on medications +CHF   Rhythm:Regular Rate:Normal     Neuro/Psych Seizures -,     GI/Hepatic GERD  ,  Endo/Other  diabetes, Well Controlled, Type 2, Insulin DependentMorbid obesity  Renal/GU Renal InsufficiencyRenal disease     Musculoskeletal   Abdominal   Peds  Hematology   Anesthesia Other Findings   Reproductive/Obstetrics                            Anesthesia Physical Anesthesia Plan  ASA: III and emergent  Anesthesia Plan: General   Post-op Pain Management:    Induction: Intravenous  Airway Management Planned: LMA  Additional Equipment:   Intra-op Plan:   Post-operative Plan: Extubation in OR  Informed Consent: I have reviewed the patients History and Physical, chart, labs and discussed the procedure including the risks, benefits and alternatives for the proposed anesthesia with the patient or authorized representative who has indicated his/her understanding and acceptance.   Dental advisory given  Plan Discussed with: CRNA, Anesthesiologist and Surgeon  Anesthesia Plan Comments:        Anesthesia Quick Evaluation

## 2014-12-15 NOTE — Op Note (Signed)
    NAME: [name]    MRN: [id number] DOB: 30-Jan-1945    DATE OF OPERATION: 12/15/2014  PREOP DIAGNOSIS: ischemic left upper extremity  POSTOP DIAGNOSIS: same  PROCEDURE: left brachial embolectomy and vein patch angioplasty using cephalic vein  SURGEON: Judeth Cornfield. Scot Dock, MD, FACS  ASSIST: none  ANESTHESIA: Gen.   EBL: minimal  INDICATIONS: MARZETTA LOFT is a 69 y.o. female he developed the sudden onset of numbness in the left upper extremity. She had no Doppler flow and I was concerned about a proximal subclavian stenosis but she was not a candidate for a CT angiogram or arteriogram because of her renal insufficiency. She was started on heparin and her symptoms did not improve and therefore elected to take her to surgery for attempted brachial thrombectomy.  FINDINGS: a moderate amount of clot was retrieved proximally this appeared to be fresh clot. I suspect she had a cardiac embolus  TECHNIQUE: The patient was taken to the operating room and received a general anesthetic. The left upper extremity was prepped and draped in usual sterile fashion. Using the Doppler located level of the brachial artery and a longitudinal incision was made in the antecubital level over the brachial artery. This was dissected out proximally and distally the artery was somewhat small. The patient was heparinized. A longitudinal arteriotomy was made in the brachial artery. A #4 Fogarty catheter was directed proximally and some fresh clot was retrieved. The catheter was passed multiple times until no further clot was retrieved. The catheter was then passed distally and no clot was retrieved distally. Heparin saline was irrigated proximally and distally. A short segment of cephalic vein at the antecubital level was harvested and ligated proximally and distally. This was opened longitudinally to bees is a vein patch. This was sewn using continuous 6-0 Prolene suture. At the completion was an excellent radial  signal with the Doppler and also a monophasic ulnar signal with the Doppler. The heparin was not reversed. The wound was closed with the peritoneal Vicryl. The skin was closed with 4-0 Vicryl. Sterile dressing was applied. The patient tolerated the procedure well and transferred to the recovery room in stable condition. All needle and sponge counts were correct.  Deitra Mayo, MD, FACS [facility]  DATE OF DICTATION:   12/15/2014
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
## 2014-12-15 NOTE — Progress Notes (Signed)
Patient complaining of left hand numbness. Cool to touch, weak radial and brachial pulse. Paged on call NP, new orders received. Will continue to monitor.

## 2014-12-15 NOTE — Anesthesia Procedure Notes (Signed)
Procedure Name: Intubation Date/Time: 12/15/2014 6:27 PM Performed by: Maude Leriche D Pre-anesthesia Checklist: Patient identified, Emergency Drugs available, Suction available, Patient being monitored and Timeout performed Patient Re-evaluated:Patient Re-evaluated prior to inductionOxygen Delivery Method: Circle system utilized Preoxygenation: Pre-oxygenation with 100% oxygen Intubation Type: IV induction Ventilation: Mask ventilation without difficulty and Oral airway inserted - appropriate to patient size LMA: LMA inserted LMA Size: 4.0 Number of attempts: 1 Placement Confirmation: positive ETCO2 and breath sounds checked- equal and bilateral Tube secured with: Tape Dental Injury: Teeth and Oropharynx as per pre-operative assessment

## 2014-12-15 NOTE — Progress Notes (Signed)
ANTICOAGULATION CONSULT NOTE - Follow Up Consult  Pharmacy Consult for Heparin Indication: left arm arterial thrombus  Allergies  Allergen Reactions  . Codeine Nausea And Vomiting    Patient Measurements: Height: 5\' 2"  (157.5 cm) Weight: 239 lb 1.6 oz (108.455 kg) IBW/kg (Calculated) : 50.1 Heparin Dosing Weight: 76.5 kg  Vital Signs: Temp: 98.4 F (36.9 C) (11/20 2023) Temp Source: Oral (11/20 1527) BP: 213/63 mmHg (11/20 2023) Pulse Rate: 88 (11/20 2023)  Labs:  Recent Labs  12/13/14 0612 12/15/14 0800  HGB 8.9* 9.4*  HCT 25.8* 27.8*  PLT 162 212  LABPROT  --  14.1  INR  --  1.07  CREATININE 3.35* 2.74*    Estimated Creatinine Clearance: 23.1 mL/min (by C-G formula based on Cr of 2.74).  Assessment: 69 y/o F with N/V and left arm numbness. Pharmacy consulted to start heparin for left arm arterial thrombus Now s/p embolectomy and heparin to resume at 2 am without a bolus  Goal of Therapy:  Heparin level 0.3-0.7 units/ml Monitor platelets by anticoagulation protocol: Yes   Plan:  Heparin at 1300 units / hr starting at 2 am 8 hr heparin level at 10 am Daily HL, CBC Monitor for S&S of bleed  Thank you Anette Guarneri, PharmD [phone] 12/15/2014,8:32 PM  ____________________________________________________

## 2014-12-15 NOTE — Progress Notes (Signed)
*  PRELIMINARY RESULTS* Vascular Ultrasound Upper Extremity Arterial Duplex has been completed.  Preliminary findings: Left = No arterial flow visualized throughout left upper extremity. The left subclavian, axillary, brachial, radial, and ulnar arteries were evaluated.  Right subclavian artery was compared and demonstrates biphasic flow.    Landry Mellow, RDMS, RVT  12/15/2014, 8:46 AM

## 2014-12-15 NOTE — Transfer of Care (Signed)
Immediate Anesthesia Transfer of Care Note  Patient: [name]  Procedure(s) Performed: Procedure(s): THROMBECTOMY BRACHIAL ARTERY; VEIN PATCH ANGIOPLASTY LEFT BRACIAL ARTERY (Left)  Patient Location: PACU  Anesthesia Type:General  Level of Consciousness: awake  Airway & Oxygen Therapy: Patient Spontanous Breathing and Patient connected to face mask oxygen  Post-op Assessment: Report given to RN and Post -op Vital signs reviewed and stable  Post vital signs: Reviewed and stable  Last Vitals:  Filed Vitals:   12/15/14 1527 12/15/14 1624  BP: 211/71 168/77  Pulse: 89   Temp: 37.1 C   Resp: 18     Complications: No apparent anesthesia complications

## 2014-12-16 ENCOUNTER — Encounter (HOSPITAL_COMMUNITY): Payer: Self-pay | Admitting: Vascular Surgery

## 2014-12-16 DIAGNOSIS — E875 Hyperkalemia: Secondary | ICD-10-CM | POA: Diagnosis not present

## 2014-12-16 DIAGNOSIS — E785 Hyperlipidemia, unspecified: Secondary | ICD-10-CM | POA: Diagnosis not present

## 2014-12-16 DIAGNOSIS — G40909 Epilepsy, unspecified, not intractable, without status epilepticus: Secondary | ICD-10-CM | POA: Diagnosis not present

## 2014-12-16 DIAGNOSIS — K219 Gastro-esophageal reflux disease without esophagitis: Secondary | ICD-10-CM | POA: Diagnosis not present

## 2014-12-16 DIAGNOSIS — R0902 Hypoxemia: Secondary | ICD-10-CM | POA: Diagnosis not present

## 2014-12-16 DIAGNOSIS — Z6841 Body Mass Index (BMI) 40.0 and over, adult: Secondary | ICD-10-CM | POA: Diagnosis not present

## 2014-12-16 DIAGNOSIS — I440 Atrioventricular block, first degree: Secondary | ICD-10-CM | POA: Diagnosis not present

## 2014-12-16 DIAGNOSIS — E860 Dehydration: Secondary | ICD-10-CM | POA: Diagnosis not present

## 2014-12-16 DIAGNOSIS — E1122 Type 2 diabetes mellitus with diabetic chronic kidney disease: Secondary | ICD-10-CM | POA: Diagnosis not present

## 2014-12-16 DIAGNOSIS — R011 Cardiac murmur, unspecified: Secondary | ICD-10-CM | POA: Diagnosis not present

## 2014-12-16 DIAGNOSIS — I5032 Chronic diastolic (congestive) heart failure: Secondary | ICD-10-CM | POA: Diagnosis not present

## 2014-12-16 DIAGNOSIS — F329 Major depressive disorder, single episode, unspecified: Secondary | ICD-10-CM | POA: Diagnosis not present

## 2014-12-16 DIAGNOSIS — N2581 Secondary hyperparathyroidism of renal origin: Secondary | ICD-10-CM | POA: Diagnosis not present

## 2014-12-16 DIAGNOSIS — T501X6A Underdosing of loop [high-ceiling] diuretics, initial encounter: Secondary | ICD-10-CM | POA: Diagnosis not present

## 2014-12-16 DIAGNOSIS — G4733 Obstructive sleep apnea (adult) (pediatric): Secondary | ICD-10-CM | POA: Diagnosis not present

## 2014-12-16 DIAGNOSIS — N179 Acute kidney failure, unspecified: Secondary | ICD-10-CM | POA: Diagnosis not present

## 2014-12-16 DIAGNOSIS — Z91128 Patient's intentional underdosing of medication regimen for other reason: Secondary | ICD-10-CM | POA: Diagnosis not present

## 2014-12-16 DIAGNOSIS — K5900 Constipation, unspecified: Secondary | ICD-10-CM | POA: Diagnosis not present

## 2014-12-16 DIAGNOSIS — I742 Embolism and thrombosis of arteries of the upper extremities: Secondary | ICD-10-CM | POA: Diagnosis not present

## 2014-12-16 DIAGNOSIS — I878 Other specified disorders of veins: Secondary | ICD-10-CM | POA: Diagnosis not present

## 2014-12-16 DIAGNOSIS — I129 Hypertensive chronic kidney disease with stage 1 through stage 4 chronic kidney disease, or unspecified chronic kidney disease: Secondary | ICD-10-CM | POA: Diagnosis not present

## 2014-12-16 DIAGNOSIS — N184 Chronic kidney disease, stage 4 (severe): Secondary | ICD-10-CM | POA: Diagnosis not present

## 2014-12-16 DIAGNOSIS — N390 Urinary tract infection, site not specified: Secondary | ICD-10-CM | POA: Diagnosis not present

## 2014-12-16 DIAGNOSIS — D631 Anemia in chronic kidney disease: Secondary | ICD-10-CM | POA: Diagnosis not present

## 2014-12-16 LAB — BASIC METABOLIC PANEL
Anion gap: 14 (ref 5–15)
BUN: 45 mg/dL — ABNORMAL HIGH (ref 6–20)
CHLORIDE: 105 mmol/L (ref 101–111)
CO2: 17 mmol/L — AB (ref 22–32)
CREATININE: 3.24 mg/dL — AB (ref 0.44–1.00)
Calcium: 8.5 mg/dL — ABNORMAL LOW (ref 8.9–10.3)
GFR calc non Af Amer: 14 mL/min — ABNORMAL LOW (ref >60)
GFR, EST AFRICAN AMERICAN: 16 mL/min — AB (ref >60)
GLUCOSE: 167 mg/dL — AB (ref 65–99)
Potassium: 6 mmol/L — ABNORMAL HIGH (ref 3.5–5.1)
Sodium: 136 mmol/L (ref 135–145)

## 2014-12-16 LAB — CBC
HCT: 26.8 % — ABNORMAL LOW (ref 36.0–46.0)
Hemoglobin: 9.1 g/dL — ABNORMAL LOW (ref 12.0–15.0)
MCH: 30.5 pg (ref 26.0–34.0)
MCHC: 34 g/dL (ref 30.0–36.0)
MCV: 89.9 fL (ref 78.0–100.0)
PLATELETS: 210 10*3/uL (ref 150–400)
RBC: 2.98 MIL/uL — ABNORMAL LOW (ref 3.87–5.11)
RDW: 12.9 % (ref 11.5–15.5)
WBC: 9.9 10*3/uL (ref 4.0–10.5)

## 2014-12-16 LAB — IRON AND TIBC
Iron: 36 ug/dL (ref 28–170)
Saturation Ratios: 19 % (ref 10.4–31.8)
TIBC: 190 ug/dL — ABNORMAL LOW (ref 250–450)
UIBC: 154 ug/dL

## 2014-12-16 LAB — CULTURE, BLOOD (ROUTINE X 2)

## 2014-12-16 LAB — GLUCOSE, CAPILLARY
GLUCOSE-CAPILLARY: 148 mg/dL — AB (ref 65–99)
Glucose-Capillary: 153 mg/dL — ABNORMAL HIGH (ref 65–99)
Glucose-Capillary: 214 mg/dL — ABNORMAL HIGH (ref 65–99)
Glucose-Capillary: 242 mg/dL — ABNORMAL HIGH (ref 65–99)
Glucose-Capillary: 199 mg/dL — ABNORMAL HIGH (ref 65–99)
Glucose-Capillary: 134 mg/dL — ABNORMAL HIGH (ref 65–99)

## 2014-12-16 LAB — URIC ACID: URIC ACID, SERUM: 8.2 mg/dL — AB (ref 2.3–6.6)

## 2014-12-16 LAB — POTASSIUM: Potassium: 4.2 mmol/L (ref 3.5–5.1)

## 2014-12-16 LAB — HEPARIN LEVEL (UNFRACTIONATED)
Heparin Unfractionated: 1.4 IU/mL — ABNORMAL HIGH (ref 0.30–0.70)
Heparin Unfractionated: 0.98 IU/mL — ABNORMAL HIGH (ref 0.30–0.70)

## 2014-12-16 LAB — LACTATE DEHYDROGENASE: LDH: 615 U/L — AB (ref 98–192)

## 2014-12-16 LAB — MAGNESIUM: Magnesium: 2.2 mg/dL (ref 1.7–2.4)

## 2014-12-16 MED ORDER — FUROSEMIDE 40 MG PO TABS: 40.0000 mg | ORAL_TABLET | Freq: Two times a day (BID) | ORAL | Status: DC

## 2014-12-16 MED ORDER — FUROSEMIDE 10 MG/ML IJ SOLN
40.0000 mg | Freq: Once | INTRAMUSCULAR | Status: AC
  Administered 2014-12-16: 40 mg via INTRAVENOUS
  Filled 2014-12-16: qty 4

## 2014-12-16 MED ORDER — SODIUM CHLORIDE 0.9 % IV SOLN
INTRAVENOUS | Status: DC
  Administered 2014-12-16 – 2014-12-17 (×2): via INTRAVENOUS

## 2014-12-16 MED ORDER — FUROSEMIDE 80 MG PO TABS
80.0000 mg | ORAL_TABLET | Freq: Two times a day (BID) | ORAL | Status: DC
  Administered 2014-12-16 – 2014-12-18 (×4): 80 mg via ORAL
  Filled 2014-12-16 (×4): qty 1

## 2014-12-16 MED ORDER — SODIUM BICARBONATE 650 MG PO TABS
1300.0000 mg | ORAL_TABLET | Freq: Two times a day (BID) | ORAL | Status: DC
  Administered 2014-12-16 – 2014-12-21 (×11): 1300 mg via ORAL
  Filled 2014-12-16 (×11): qty 2

## 2014-12-16 MED ORDER — HEPARIN (PORCINE) IN NACL 100-0.45 UNIT/ML-% IJ SOLN: 1000.0000 [IU]/h | INTRAMUSCULAR | Status: DC

## 2014-12-16 MED ORDER — HEPARIN (PORCINE) IN NACL 100-0.45 UNIT/ML-% IJ SOLN
800.0000 [IU]/h | INTRAMUSCULAR | Status: DC
  Administered 2014-12-17: 800 [IU]/h via INTRAVENOUS
  Filled 2014-12-16: qty 250

## 2014-12-16 MED ORDER — BISACODYL 5 MG PO TBEC
10.0000 mg | DELAYED_RELEASE_TABLET | Freq: Every morning | ORAL | Status: DC
  Administered 2014-12-18 – 2014-12-20 (×3): 10 mg via ORAL
  Filled 2014-12-16 (×6): qty 2

## 2014-12-16 MED ORDER — ONDANSETRON HCL 4 MG/2ML IJ SOLN
4.0000 mg | Freq: Once | INTRAMUSCULAR | Status: AC
  Administered 2014-12-16: 4 mg via INTRAVENOUS
  Filled 2014-12-16: qty 2

## 2014-12-16 MED ORDER — HYDRALAZINE HCL 50 MG PO TABS
100.0000 mg | ORAL_TABLET | Freq: Three times a day (TID) | ORAL | Status: DC
  Administered 2014-12-16 – 2014-12-21 (×16): 100 mg via ORAL
  Filled 2014-12-16 (×16): qty 2

## 2014-12-16 MED ORDER — HEPARIN (PORCINE) IN NACL 100-0.45 UNIT/ML-% IJ SOLN
1000.0000 [IU]/h | INTRAMUSCULAR | Status: DC
  Administered 2014-12-16: 1000 [IU]/h via INTRAVENOUS

## 2014-12-16 MED ORDER — SODIUM POLYSTYRENE SULFONATE 15 GM/60ML PO SUSP
30.0000 g | Freq: Once | ORAL | Status: AC
  Administered 2014-12-16: 30 g via ORAL
  Filled 2014-12-16: qty 120

## 2014-12-16 MED ORDER — SODIUM CHLORIDE 0.9 % IV BOLUS (SEPSIS)
500.0000 mL | Freq: Once | INTRAVENOUS | Status: AC
  Administered 2014-12-16: 500 mL via INTRAVENOUS

## 2014-12-16 MED ORDER — OXYCODONE-ACETAMINOPHEN 5-325 MG PO TABS
1.0000 | ORAL_TABLET | ORAL | Status: DC | PRN
  Administered 2014-12-16 – 2014-12-21 (×4): 1 via ORAL
  Filled 2014-12-16 (×4): qty 1

## 2014-12-16 NOTE — Progress Notes (Signed)
Pt up to chair, 1XBM this afternoon. Pt has not complained of any N/V

## 2014-12-16 NOTE — Anesthesia Postprocedure Evaluation (Signed)
Anesthesia Post Note  Patient: [name]  Procedure(s) Performed: Procedure(s) (LRB): THROMBECTOMY BRACHIAL ARTERY; VEIN PATCH ANGIOPLASTY LEFT BRACIAL ARTERY (Left)  Patient location during evaluation: PACU Anesthesia Type: General Level of consciousness: awake and alert Pain management: pain level controlled Vital Signs Assessment: post-procedure vital signs reviewed and stable Respiratory status: spontaneous breathing, nonlabored ventilation, respiratory function stable and patient connected to nasal cannula oxygen Cardiovascular status: blood pressure returned to baseline and stable Postop Assessment: No signs of nausea or vomiting Anesthetic complications: no    Last Vitals:  Filed Vitals:   12/16/14 0040 12/16/14 0110  BP: 172/64 176/67  Pulse: 86 85  Temp:    Resp: 15 10    Last Pain:  Filed Vitals:   12/16/14 0329  PainSc: Asleep                 [name] A

## 2014-12-16 NOTE — Progress Notes (Signed)
Patient Demographics:    [name], is a 69 y.o. female, DOB - 11/04/1945, [id number]  Admit date - 12/11/2014   Admitting Physician [name], MD  Outpatient Primary MD for the patient is [name],[name], MD  LOS - 5   Chief Complaint  Patient presents with  . Emesis  . Bradycardia        Subjective:    [name] today has, No headache, No chest pain, No abdominal pain - No Nausea, No new weakness tingling or numbness, No Cough - SOB.  Planing of left arm pain and numbness since 5:30 AM this morning.   Assessment  & Plan :     1.N&V - most likely due to underlying constipation, KUB shows stool burden but no obstruction, place on bowel regimen and monitor. Currently nausea free. As needed IV Zofran. Initiated bowel regimen for constipation. Now symptom free and feels better.   2. Bradycardia upon admission. Medication related, beta blocker and clonidine held, heart rate better. TSH stable. Echo unremarkable. Cardiology on board and has signed off. Currently in sinus. Blood pressure stable.   3. ARF and CK D stage IV. Baseline creatinine close to 2.2, likely due to nausea vomiting dehydration, creatinine peaked at 4. Continue gentle hydration and monitor renal function. We'll involve nephrology as well.  Hyperkalemia due to combination of worsening renal function, ischemia and hypoperfusion due to left arm ischemia and constipation. Initiated on bowel regimen, Kayexalate, IV fluid with Lasix, monitor potassium.   4. UTI. Placed on Rocephin. Given a dose of fluconazole. Stable Renal US.   5. Essential hypertension. Currently on Norvasc and hydralazine continued.   6. Chronic diastolic dysfunction with EF 55-60%. Clinically compensated monitor of diuretic. Mild troponin  elevation not an ACS pattern, EKG nonacute, on aspirin, cannot use beta blocker due to bradycardia, no further workup per cardiology who has seen the patient. Chest pain-free.   7. 1 out of 2 blood cultures positive for gram-positive cocci, confirmed coag negative staph which is contamination. Stop all antibiotics.   8. Left arm weakness/numbness started 5:30 AM 12/15/2014. On exam, appears cold with weak radial pulse, stat arterial duplex obtained with evidence of loss of blood flow, heparin drip started, continue aspirin, vascular surgery on board underwent emergent left brachial embolectomy on 12/15/2014 by vascular surgery, continues to be on heparin drip. Left arm is weak. Initiate PT OT, further management per vascular surgery.    Code Status : Full  Family Communication  : none  Disposition Plan  : HHPT  Consults  : Cards, vascular surgery, renal  Procedures  :   TTE  Left ventricle: The cavity size was normal. Wall thickness wasincreased in a pattern of moderate LVH. Systolic function wasnormal. The estimated ejection fraction was in the range of 55%to 60%. Wall motion was normal; there were no regional wallmotion abnormalities. - Aortic valve: Valve area (VTI): 1.97 cm^2. Valve area (Vmax): 1.77 cm^2. Valve area (Vmean): 2.09 cm^2. - Left atrium: The atrium was mildly dilated.  Vascular Ultrasound  Upper Extremity Arterial Duplex has been completed. Preliminary findings: Left = No arterial flow visualized throughout left upper extremity. The left subclavian, axillary, brachial, radial, and ulnar arteries were evaluated.  Right subclavian artery was compared and  demonstrates biphasic flow.  Left arm brachial embolectomy on 12/15/2014 by Dr. Scot Dock.  Renal US - Stable   DVT Prophylaxis  :   Heparin    Lab Results  Component Value Date   PLT 210 12/16/2014    Inpatient Medications  Scheduled Meds: . amLODipine  10 mg Oral Daily  . antiseptic oral rinse  7  mL Mouth Rinse BID  . aspirin  81 mg Oral Daily  . bisacodyl  10 mg Oral q morning - 10a  . carbamazepine  400 mg Oral BID  . cefTRIAXone (ROCEPHIN)  IV  1 g Intravenous Q24H  . citalopram  20 mg Oral Daily  . docusate sodium  200 mg Oral BID  . ezetimibe-simvastatin  1 tablet Oral QHS  . fluconazole  100 mg Oral Daily  . furosemide  40 mg Oral BID  . hydrALAZINE  50 mg Oral 3 times per day  . insulin aspart  0-15 Units Subcutaneous TID WC  . insulin aspart  4 Units Subcutaneous TID WC  . insulin glargine  15 Units Subcutaneous QHS  . lacosamide  100 mg Oral BID  . nitroGLYCERIN  0.5 inch Topical 4 times per day  . pantoprazole  40 mg Oral Daily  . pneumococcal 23 valent vaccine  0.5 mL Intramuscular Tomorrow-1000  . vancomycin  1,500 mg Intravenous Q48H   Continuous Infusions: . heparin 1,300 Units/hr (12/16/14 0215)   PRN Meds:.acetaminophen **OR** [DISCONTINUED] acetaminophen, albuterol, clobetasol cream, hydrALAZINE, metoprolol, ondansetron (ZOFRAN) IV, senna-docusate  Antibiotics  :     Anti-infectives    Start     Dose/Rate Route Frequency Ordered Stop   12/16/14 0600  ceFAZolin (ANCEF) IVPB 1 g/50 mL premix  Status:  Discontinued    Comments:  Send with pt to OR   1 g 100 mL/hr over 30 Minutes Intravenous To ShortStay Surgical 12/15/14 1720 12/15/14 2056   12/15/14 2045  vancomycin (VANCOCIN) 1,500 mg in sodium chloride 0.9 % 500 mL IVPB     1,500 mg 250 mL/hr over 120 Minutes Intravenous Every 48 hours 12/15/14 0921     12/13/14 2045  vancomycin (VANCOCIN) IVPB 1000 mg/200 mL premix  Status:  Discontinued     1,000 mg 200 mL/hr over 60 Minutes Intravenous Every 48 hours 12/13/14 2031 12/15/14 0921   12/12/14 0800  cefTRIAXone (ROCEPHIN) 1 g in dextrose 5 % 50 mL IVPB     1 g 100 mL/hr over 30 Minutes Intravenous Every 24 hours 12/12/14 0725     12/11/14 1230  fluconazole (DIFLUCAN) tablet 100 mg     100 mg Oral Daily 12/11/14 1222          Objective:   Filed  Vitals:   12/16/14 0110 12/16/14 0328 12/16/14 0749 12/16/14 0759  BP: 176/67   191/62  Pulse: 85   85  Temp:   98.8 F (37.1 C)   TempSrc:   Axillary   Resp: 10   16  Height:      Weight:  107.9 kg (237 lb 14 oz)    SpO2: 100%   100%    Wt Readings from Last 3 Encounters:  12/16/14 107.9 kg (237 lb 14 oz)  11/22/14 109.68 kg (241 lb 12.8 oz)  07/24/14 113.445 kg (250 lb 1.6 oz)     Intake/Output Summary (Last 24 hours) at 12/16/14 1039 Last data filed at 12/16/14 0543  Gross per 24 hour  Intake 1233.4 ml  Output   1150 ml  Net  83.4 ml     Physical Exam  Awake Alert, Oriented X 3, No new F.N deficits, Normal affect [place].AT,PERRAL Supple Neck,No JVD, No cervical lymphadenopathy appriciated.  Symmetrical Chest wall movement, Good air movement bilaterally, CTAB RRR,No Gallops,Rubs or new Murmurs, No Parasternal Heave +ve B.Sounds, Abd Soft, No tenderness, No organomegaly appriciated, No rebound - guarding or rigidity. No Cyanosis, Clubbing or edema, No new Rash or bruise,   left arm is his warm with improved radial pulsation but still weak     Data Review:   Micro Results Recent Results (from the past 240 hour(s))  Urine culture     Status: None   Collection Time: 12/11/14  8:58 AM  Result Value Ref Range Status   Specimen Description URINE, RANDOM  Final   Special Requests NONE  Final   Culture MULTIPLE SPECIES PRESENT, SUGGEST RECOLLECTION  Final   Report Status 12/13/2014 FINAL  Final  Culture, blood (routine x 2)     Status: None   Collection Time: 12/12/14  4:50 PM  Result Value Ref Range Status   Specimen Description BLOOD RIGHT ARM  Final   Special Requests IN PEDIATRIC BOTTLE 1CC  Final   Culture  Setup Time   Final    GRAM POSITIVE COCCI IN CLUSTERS AEROBIC BOTTLE ONLY CRITICAL RESULT CALLED TO, READ BACK BY AND VERIFIED WITH: Dolores Hoose RN 2014 12/13/14 A BROWNING    Culture   Final    STAPHYLOCOCCUS SPECIES (COAGULASE NEGATIVE) THE  SIGNIFICANCE OF ISOLATING THIS ORGANISM FROM A SINGLE SET OF BLOOD CULTURES WHEN MULTIPLE SETS ARE DRAWN IS UNCERTAIN. PLEASE NOTIFY THE MICROBIOLOGY DEPARTMENT WITHIN ONE WEEK IF SPECIATION AND SENSITIVITIES ARE REQUIRED.    Report Status 12/16/2014 FINAL  Final  Culture, blood (routine x 2)     Status: None (Preliminary result)   Collection Time: 12/12/14  4:59 PM  Result Value Ref Range Status   Specimen Description BLOOD RIGHT ANTECUBITAL  Final   Special Requests BOTTLES DRAWN AEROBIC AND ANAEROBIC 5CC  Final   Culture NO GROWTH 3 DAYS  Final   Report Status PENDING  Incomplete  Surgical pcr screen     Status: None   Collection Time: 12/15/14  9:06 AM  Result Value Ref Range Status   MRSA, PCR NEGATIVE NEGATIVE Final   Staphylococcus aureus NEGATIVE NEGATIVE Final    Comment:        The Xpert SA Assay (FDA approved for NASAL specimens in patients over 71 years of age), is one component of a comprehensive surveillance program.  Test performance has been validated by [facility] for patients greater than or equal to 30 year old. It is not intended to diagnose infection nor to guide or monitor treatment.     Radiology Reports Dg Chest 2 View  12/14/2014  CLINICAL DATA:  Patient with shortness of breath. EXAM: CHEST  2 VIEW COMPARISON:  Chest radiograph 12/13/2014 FINDINGS: Monitoring leads overlie the patient. Stable enlarged cardiac and mediastinal contours. Elevation of the right hemidiaphragm. No consolidative pulmonary opacities. No pleural effusion or pneumothorax. Regional skeleton is unremarkable. IMPRESSION: No acute cardiopulmonary process. Cardiomegaly. Electronically Signed   By: Lovey Newcomer M.D.   On: 12/14/2014 14:43   Dg Chest 2 View  12/11/2014  CLINICAL DATA:  Bradycardia and vomiting since yesterday. EXAM: CHEST  2 VIEW COMPARISON:  None. FINDINGS: Enlarged cardiac silhouette similar to prior. Exam is lordotic which exaggerates the cardiac silhouette. Interval  increase in central venous congestion. Low lung volumes. No  pleural fluid. IMPRESSION: Cardiomegaly and increased central venous congestion. Electronically Signed   By: Suzy Bouchard M.D.   On: 12/11/2014 09:31   US Renal  12/12/2014  CLINICAL DATA:  Acute renal failure EXAM: RENAL / URINARY TRACT ULTRASOUND COMPLETE COMPARISON:  None. FINDINGS: Right Kidney: Length: 9.7 cm. Echogenicity within normal limits. No mass or hydronephrosis visualized. Left Kidney: Length: 9.2 cm. Echogenicity within normal limits. No mass or hydronephrosis visualized. Bladder: Decompressed following voiding IMPRESSION: No acute abnormality noted. Electronically Signed   By: Inez Catalina M.D.   On: 12/12/2014 16:56   Dg Chest Port 1 View  12/13/2014  CLINICAL DATA:  Shortness of breath. EXAM: PORTABLE CHEST 1 VIEW COMPARISON:  December 12, 2014 FINDINGS: Stable mild cardiomegaly is noted with mild central pulmonary vascular congestion. No pneumothorax or pleural effusion is noted. Both lungs are clear. Osteophyte formation is noted in mid thoracic spine. IMPRESSION: Stable mild cardiomegaly with central pulmonary vascular congestion. No significant change compared to prior exam. Electronically Signed   By: Marijo Conception, M.D.   On: 12/13/2014 07:55   Dg Chest Port 1 View  12/12/2014  CLINICAL DATA:  Shortness of breath for 2 days EXAM: PORTABLE CHEST 1 VIEW COMPARISON:  12/11/2014 FINDINGS: Cardiomegaly again noted. Mild thoracic dextroscoliosis. Degenerative changes thoracic spine. Central mild vascular congestion without pulmonary edema. No focal infiltrate IMPRESSION: Cardiomegaly. Mild thoracic dextroscoliosis. Central vascular congestion without convincing pulmonary edema. No segmental infiltrate. Electronically Signed   By: Lahoma Crocker M.D.   On: 12/12/2014 08:00   Dg Abd Portable 1v  12/12/2014  CLINICAL DATA:  Nausea EXAM: PORTABLE ABDOMEN - 1 VIEW COMPARISON:  CT abdomen 08/06/2009 FINDINGS: Retained stool in  the colon. Negative for bowel obstruction. No renal calculi. Lumbar degenerative change at L4-5 and L5-S1. No acute skeletal abnormality IMPRESSION: Constipation without bowel obstruction. Electronically Signed   By: Franchot Gallo M.D.   On: 12/12/2014 10:28     CBC  Recent Labs Lab 12/11/14 0835 12/11/14 1305 12/12/14 0042 12/13/14 0612 12/15/14 0800 12/16/14 0505  WBC 7.1 7.3 9.4 7.3 8.7 9.9  HGB 10.2* 10.4* 9.6* 8.9* 9.4* 9.1*  HCT 30.4* 30.6* 27.6* 25.8* 27.8* 26.8*  PLT 197 202 176 162 212 210  MCV 89.9 89.5 89.6 89.6 89.4 89.9  MCH 30.2 30.4 31.2 30.9 30.2 30.5  MCHC 33.6 34.0 34.8 34.5 33.8 34.0  RDW 12.2 12.2 12.5 12.5 12.5 12.9  LYMPHSABS 0.9  --   --   --   --   --   MONOABS 0.9  --   --   --   --   --   EOSABS 0.0  --   --   --   --   --   BASOSABS 0.0  --   --   --   --   --     Chemistries   Recent Labs Lab 12/11/14 0835 12/11/14 1305 12/12/14 0042 12/13/14 0612 12/15/14 0800 12/16/14 0444  NA 137  --  135 137 134* 136  K 4.5  --  4.1 4.3 4.3 6.0*  CL 103  --  102 103 99* 105  CO2 24  --  21* 25 21* 17*  GLUCOSE 202*  --  132* 92 273* 167*  BUN 58*  --  60* 53* 39* 45*  CREATININE 4.02* 3.74* 3.66* 3.35* 2.74* 3.24*  CALCIUM 8.6*  --  8.3* 8.3* 9.0 8.5*  MG  --   --   --   --   --  2.2  AST 20  --   --   --   --   --   ALT 15  --   --   --   --   --   ALKPHOS 95  --   --   --   --   --   BILITOT 0.7  --   --   --   --   --    ------------------------------------------------------------------------------------------------------------------ estimated creatinine clearance is 19.5 mL/min (by C-G formula based on Cr of 3.24). ------------------------------------------------------------------------------------------------------------------ No results for input(s): HGBA1C in the last 72 hours. ------------------------------------------------------------------------------------------------------------------ No results for input(s): CHOL, HDL, LDLCALC,  TRIG, CHOLHDL, LDLDIRECT in the last 72 hours. ------------------------------------------------------------------------------------------------------------------ No results for input(s): TSH, T4TOTAL, T3FREE, THYROIDAB in the last 72 hours.  Invalid input(s): FREET3 ------------------------------------------------------------------------------------------------------------------ No results for input(s): VITAMINB12, FOLATE, FERRITIN, TIBC, IRON, RETICCTPCT in the last 72 hours.  Coagulation profile  Recent Labs Lab 12/15/14 0800  INR 1.07    No results for input(s): DDIMER in the last 72 hours.  Cardiac Enzymes  Recent Labs Lab 12/11/14 1305 12/11/14 1905 12/12/14 0042  TROPONINI 0.49* 0.57* 0.49*   ------------------------------------------------------------------------------------------------------------------ Invalid input(s): POCBNP   Time Spent in minutes 35   Charish Schroepfer K M.D on 12/16/2014 at 10:39 AM  Between 7am to 7pm - Pager - [phone]  After 7pm go to [email or web address] - password [email or web address]  [facility] -  Office  [phone]

## 2014-12-16 NOTE — Progress Notes (Addendum)
  [facility] Progress Note  Subjective  - POD #1  Left hand feels better. Arm still feels sore.   Objective Filed Vitals:   12/16/14 1222 12/16/14 1252  BP: 172/78 171/57  Pulse:  87  Temp:    Resp: 15 14    Intake/Output Summary (Last 24 hours) at 12/16/14 1335 Last data filed at 12/16/14 1058  Gross per 24 hour  Intake 1473.4 ml  Output   1150 ml  Net  323.4 ml   2+ left radial pulse. Antecubital incision clean and intact. Hand is warm with sensation and motor intact.   Assessment/Planning: 69 y.o. female is s/p: emergent left brachial embolectomy  1 Day Post-Op   Palpable 2+ left radial pulse.  Suspect cardiac source of embolus. Currently on heparin. Will need long-term anticoagulation.   Alvia Grove 12/16/2014 1:35 PM --  Laboratory CBC    Component Value Date/Time   WBC 9.9 12/16/2014 0505   HGB 9.1* 12/16/2014 0505   HCT 26.8* 12/16/2014 0505   PLT 210 12/16/2014 0505    BMET    Component Value Date/Time   NA 136 12/16/2014 0444   K 6.0* 12/16/2014 0444   CL 105 12/16/2014 0444   CO2 17* 12/16/2014 0444   GLUCOSE 167* 12/16/2014 0444   BUN 45* 12/16/2014 0444   CREATININE 3.24* 12/16/2014 0444   CALCIUM 8.5* 12/16/2014 0444   GFRNONAA 14* 12/16/2014 0444   GFRAA 16* 12/16/2014 0444    COAG Lab Results  Component Value Date   INR 1.07 12/15/2014   INR 1.07 09/26/2012   INR 1.0 10/20/2007   No results found for: PTT  Antibiotics Anti-infectives    Start     Dose/Rate Route Frequency Ordered Stop   12/16/14 0600  ceFAZolin (ANCEF) IVPB 1 g/50 mL premix  Status:  Discontinued    Comments:  Send with pt to OR   1 g 100 mL/hr over 30 Minutes Intravenous To ShortStay Surgical 12/15/14 1720 12/15/14 2056   12/15/14 2045  vancomycin (VANCOCIN) 1,500 mg in sodium chloride 0.9 % 500 mL IVPB  Status:  Discontinued     1,500 mg 250 mL/hr over 120 Minutes Intravenous Every 48 hours 12/15/14 0921 12/16/14 1117   12/13/14  2045  vancomycin (VANCOCIN) IVPB 1000 mg/200 mL premix  Status:  Discontinued     1,000 mg 200 mL/hr over 60 Minutes Intravenous Every 48 hours 12/13/14 2031 12/15/14 0921   12/12/14 0800  cefTRIAXone (ROCEPHIN) 1 g in dextrose 5 % 50 mL IVPB  Status:  Discontinued     1 g 100 mL/hr over 30 Minutes Intravenous Every 24 hours 12/12/14 0725 12/16/14 1117   12/11/14 1230  fluconazole (DIFLUCAN) tablet 100 mg  Status:  Discontinued     100 mg Oral Daily 12/11/14 1222 12/16/14 [place], PA-C [facility] Office: [phone] Pager: [phone] 12/16/2014 1:35 PM  Addendum  I have independently interviewed and examined the patient, and I agree with the physician assistant's findings.  Strong radial pulse.  L antecubital incision c/d/i.  Echo with thrombus.  CTA Chest not possible due to CKD Stage III-IV.  Would anticoagulate for at least 3 months given presumed thromboembolism to left brachial artery.  Patient can follow up with Dr. Scot Dock in 2 weeks.  Adele Barthel, MD [facility] [place] Office: [phone] Pager: [phone]  12/16/2014, 2:52 PM

## 2014-12-16 NOTE — Progress Notes (Signed)
Pt had very large BM X2. No blood noted. Will hold Enema. Will continue to monitor

## 2014-12-16 NOTE — Progress Notes (Signed)
On assessment, Left arm at embolectomy site was cooler to touch than the right, palpable radial pulse but appears to be more swollen than last night with a tight sensation to the patient. Patient sates "it is sore" "feels like it's getting tighter by the second". Passenger transport manager assessed. Vascular MD on call notified of findings, no new orders received as long as the pulse is still palpable.   Will continue to monitor

## 2014-12-16 NOTE — Consult Note (Signed)
Reason for Consult:CKD, AKI Referring Physician: [name] is an 69 y.o. female.  HPI: 69 yr female with hx severe HTN , long term DM, massive obesity, Sz, GERD , ^ lipids. Who has presented on 11/16 with recurrent vomiting, bradycardia. Similar presentation about a month ago.  On 2 chronotropic bp meds, which have been d/c'ed.  Also developed cold L arm about 48h ago and found to have acute embolus to L arm, which has been removed. Was in CHF with SOB, interstitial edema on CXR and ^ BNP. On admit.  bp has gone up and down since admit.       Has been worked up and seen by Dr. [name] in past with last visit 8/15.   Baseline Cr 2.5-3. Recently and on this admit 2.7-4.  8/15 was 1.75.  Hx NSAID use, and does not take her diuretics at home. Constitutional: inactive , sedentary, SOB with minimal exertion Eyes: negative Ears, nose, mouth, throat, and face: negative Respiratory: cough esp with eating and SOB eating Cardiovascular: edema, SOB with minimal exertion, LE edema Gastrointestinal: see HPI Genitourinary:negative Integument/breast: negative Hematologic/lymphatic: anemia Musculoskeletal:multiple joints stiff Endocrine: monitors bs at home Allergic/Immunologic: codiene  Primary Nephrologist [name]. .  Past Medical History  Diagnosis Date  . Essential hypertension   . CKD (chronic kidney disease), stage IV ([place])   . Hyperlipidemia   . GERD (gastroesophageal reflux disease)   . Morbid obesity ([place])   . Depression   . CHF (congestive heart failure) ([place])   . Type II diabetes mellitus ([place])   . Anemia   . Seizures ([place])     "epilepsy; they think I had one a couple months ago" (12/11/2014)    Past Surgical History  Procedure Laterality Date  . Tubal ligation    . Thrombectomy brachial artery Left 12/15/2014    Procedure: THROMBECTOMY BRACHIAL ARTERY; VEIN PATCH ANGIOPLASTY LEFT BRACIAL ARTERY;  Surgeon: Paula Mould, MD;  Location: [facility] OR;  Service:  Vascular;  Laterality: Left;    Family History  Problem Relation Age of Onset  . Hypertension Mother     died @ 32 - unknown cause.  [name] Hypertension Father     says she doesn't know anything about her father.  . Cancer Maternal Aunt   . Seizures Neg Hx     Social History:  reports that she has been passively smoking.  She has never used smokeless tobacco. She reports that she does not drink alcohol or use illicit drugs.  Allergies:  Allergies  Allergen Reactions  . Codeine Nausea And Vomiting    Medications:  I have reviewed the patient's current medications. Prior to Admission:  Prescriptions prior to admission  Medication Sig Dispense Refill Last Dose  . amLODipine (NORVASC) 10 MG tablet Take 10 mg by mouth daily.   12/10/2014 at Unknown time  . Ascorbic Acid (VITAMIN C PO) Take 1 tablet by mouth daily.   12/10/2014 at Unknown time  . carbamazepine (TEGRETOL) 200 MG tablet Take 400 mg by mouth 2 (two) times daily.   12/10/2014 at Unknown time  . Cholecalciferol (VITAMIN D-3) 1000 UNITS CAPS Take 1,000 Units by mouth daily at 12 noon.    12/10/2014 at Unknown time  . ciprofloxacin (CIPRO) 500 MG tablet Take 500 mg by mouth every 12 (twelve) hours. 3 day supply, not yet started  0   . citalopram (CELEXA) 20 MG tablet Take 20 mg by mouth daily.   12/10/2014 at Unknown time  .  clobetasol cream (TEMOVATE) 5.10 % Apply 1 application topically 2 (two) times daily as needed (for legs).    12/10/2014 at Unknown time  . cloNIDine (CATAPRES) 0.2 MG tablet Take 0.2 mg by mouth 2 (two) times daily.   Taking  . ezetimibe-simvastatin (VYTORIN) 10-40 MG per tablet Take 1 tablet by mouth daily.   12/10/2014 at Unknown time  . furosemide (LASIX) 80 MG tablet Take 40 mg by mouth daily.    12/10/2014 at Unknown time  . glipiZIDE (GLUCOTROL XL) 5 MG 24 hr tablet Take 5 mg by mouth daily with breakfast.   12/10/2014 at Unknown time  . HUMULIN 70/30 KWIKPEN (70-30) 100 UNIT/ML PEN Inject 20 Units into  the skin 2 (two) times daily.  0 12/10/2014 at Unknown time  . hydrALAZINE (APRESOLINE) 25 MG tablet Take 25 mg by mouth 2 (two) times daily.  0 12/10/2014 at Unknown time  . Lacosamide (VIMPAT) 100 MG TABS Take 1 tablet by mouth 2 (two) times daily.   12/10/2014 at Unknown time  . metoprolol succinate (TOPROL-XL) 50 MG 24 hr tablet Take 50 mg by mouth 2 (two) times daily. Take with or immediately following a meal.   12/10/2014 at 2000  . omeprazole (PRILOSEC) 40 MG capsule Take 40 mg by mouth daily.   12/10/2014 at Unknown time    Results for orders placed or performed during the hospital encounter of 12/11/14 (from the past 48 hour(s))  Glucose, capillary     Status: Abnormal   Collection Time: 12/14/14  4:55 PM  Result Value Ref Range   Glucose-Capillary 123 (H) 65 - 99 mg/dL  Glucose, capillary     Status: Abnormal   Collection Time: 12/14/14  8:55 PM  Result Value Ref Range   Glucose-Capillary 133 (H) 65 - 99 mg/dL  Glucose, capillary     Status: Abnormal   Collection Time: 12/15/14  7:32 AM  Result Value Ref Range   Glucose-Capillary 230 (H) 65 - 99 mg/dL  Basic metabolic panel     Status: Abnormal   Collection Time: 12/15/14  8:00 AM  Result Value Ref Range   Sodium 134 (L) 135 - 145 mmol/L   Potassium 4.3 3.5 - 5.1 mmol/L   Chloride 99 (L) 101 - 111 mmol/L   CO2 21 (L) 22 - 32 mmol/L   Glucose, Bld 273 (H) 65 - 99 mg/dL   BUN 39 (H) 6 - 20 mg/dL   Creatinine, Ser 2.74 (H) 0.44 - 1.00 mg/dL   Calcium 9.0 8.9 - 10.3 mg/dL   GFR calc non Af Amer 17 (L) >60 mL/min   GFR calc Af Amer 20 (L) >60 mL/min    Comment: (NOTE) The eGFR has been calculated using the CKD EPI equation. This calculation has not been validated in all clinical situations. eGFR's persistently <60 mL/min signify possible Chronic Kidney Disease.    Anion gap 14 5 - 15  Lactic acid, plasma     Status: Abnormal   Collection Time: 12/15/14  8:00 AM  Result Value Ref Range   Lactic Acid, Venous 3.2 (HH) 0.5  - 2.0 mmol/L    Comment: CRITICAL RESULT CALLED TO, READ BACK BY AND VERIFIED WITH: RN [facility] AT [id number] MARTINB   CBC     Status: Abnormal   Collection Time: 12/15/14  8:00 AM  Result Value Ref Range   WBC 8.7 4.0 - 10.5 K/uL   RBC 3.11 (L) 3.87 - 5.11 MIL/uL   Hemoglobin 9.4 (L) 12.0 -  15.0 g/dL   HCT 27.8 (L) 36.0 - 46.0 %   MCV 89.4 78.0 - 100.0 fL   MCH 30.2 26.0 - 34.0 pg   MCHC 33.8 30.0 - 36.0 g/dL   RDW 12.5 11.5 - 15.5 %   Platelets 212 150 - 400 K/uL  Protime-INR     Status: None   Collection Time: 12/15/14  8:00 AM  Result Value Ref Range   Prothrombin Time 14.1 11.6 - 15.2 seconds   INR 1.07 0.00 - 1.49  Surgical pcr screen     Status: None   Collection Time: 12/15/14  9:06 AM  Result Value Ref Range   MRSA, PCR NEGATIVE NEGATIVE   Staphylococcus aureus NEGATIVE NEGATIVE    Comment:        The Xpert SA Assay (FDA approved for NASAL specimens in patients over 5 years of age), is one component of a comprehensive surveillance program.  Test performance has been validated by [facility] for patients greater than or equal to 69 year old. It is not intended to diagnose infection nor to guide or monitor treatment.   Glucose, capillary     Status: Abnormal   Collection Time: 12/15/14 11:30 AM  Result Value Ref Range   Glucose-Capillary 257 (H) 65 - 99 mg/dL  Lactic acid, plasma     Status: None   Collection Time: 12/15/14  1:13 PM  Result Value Ref Range   Lactic Acid, Venous 1.5 0.5 - 2.0 mmol/L  Glucose, capillary     Status: Abnormal   Collection Time: 12/15/14  2:59 PM  Result Value Ref Range   Glucose-Capillary 186 (H) 65 - 99 mg/dL  Glucose, capillary     Status: Abnormal   Collection Time: 12/15/14  4:18 PM  Result Value Ref Range   Glucose-Capillary 144 (H) 65 - 99 mg/dL  Glucose, capillary     Status: Abnormal   Collection Time: 12/15/14  7:55 PM  Result Value Ref Range   Glucose-Capillary 146 (H) 65 - 99 mg/dL  Basic metabolic  panel     Status: Abnormal   Collection Time: 12/16/14  4:44 AM  Result Value Ref Range   Sodium 136 135 - 145 mmol/L   Potassium 6.0 (H) 3.5 - 5.1 mmol/L    Comment: HEMOLYSIS AT THIS LEVEL MAY AFFECT RESULT DELTA CHECK NOTED    Chloride 105 101 - 111 mmol/L   CO2 17 (L) 22 - 32 mmol/L   Glucose, Bld 167 (H) 65 - 99 mg/dL   BUN 45 (H) 6 - 20 mg/dL   Creatinine, Ser 3.24 (H) 0.44 - 1.00 mg/dL   Calcium 8.5 (L) 8.9 - 10.3 mg/dL   GFR calc non Af Amer 14 (L) >60 mL/min   GFR calc Af Amer 16 (L) >60 mL/min    Comment: (NOTE) The eGFR has been calculated using the CKD EPI equation. This calculation has not been validated in all clinical situations. eGFR's persistently <60 mL/min signify possible Chronic Kidney Disease.    Anion gap 14 5 - 15  Magnesium     Status: None   Collection Time: 12/16/14  4:44 AM  Result Value Ref Range   Magnesium 2.2 1.7 - 2.4 mg/dL  CBC     Status: Abnormal   Collection Time: 12/16/14  5:05 AM  Result Value Ref Range   WBC 9.9 4.0 - 10.5 K/uL   RBC 2.98 (L) 3.87 - 5.11 MIL/uL   Hemoglobin 9.1 (L) 12.0 - 15.0 g/dL  HCT 26.8 (L) 36.0 - 46.0 %   MCV 89.9 78.0 - 100.0 fL   MCH 30.5 26.0 - 34.0 pg   MCHC 34.0 30.0 - 36.0 g/dL   RDW 12.9 11.5 - 15.5 %   Platelets 210 150 - 400 K/uL  Glucose, capillary     Status: Abnormal   Collection Time: 12/16/14  7:26 AM  Result Value Ref Range   Glucose-Capillary 153 (H) 65 - 99 mg/dL   Comment 1 Notify RN    Comment 2 Document in Chart   Glucose, capillary     Status: Abnormal   Collection Time: 12/16/14 10:25 AM  Result Value Ref Range   Glucose-Capillary 214 (H) 65 - 99 mg/dL  Heparin level (unfractionated)     Status: Abnormal   Collection Time: 12/16/14 10:26 AM  Result Value Ref Range   Heparin Unfractionated 1.40 (H) 0.30 - 0.70 IU/mL    Comment: RESULTS CONFIRMED BY MANUAL DILUTION        IF HEPARIN RESULTS ARE BELOW EXPECTED VALUES, AND PATIENT DOSAGE HAS BEEN CONFIRMED, SUGGEST FOLLOW UP  TESTING OF ANTITHROMBIN III LEVELS.   Glucose, capillary     Status: Abnormal   Collection Time: 12/16/14 11:18 AM  Result Value Ref Range   Glucose-Capillary 242 (H) 65 - 99 mg/dL   Comment 1 Notify RN    Comment 2 Document in Chart   Glucose, capillary     Status: Abnormal   Collection Time: 12/16/14  1:14 PM  Result Value Ref Range   Glucose-Capillary 199 (H) 65 - 99 mg/dL    No results found.  ROS Blood pressure 171/57, pulse 87, temperature 97.8 F (36.6 C), temperature source Oral, resp. rate 14, height _0  (1.575 m), weight 107.9 kg (237 lb 14 oz), SpO2 100 %. Physical Exam Physical Examination: General appearance - obese, cooperative , alert Mental status - alert, oriented to person, place, and time Eyes - pupils equal and reactive, extraocular eye movements intact Mouth -partial plates Neck - adenopathy noted PCL Lymphatics - posterior cervical nodes Chest - decreased bs, , rales in bases Heart - S1 and S2 normal, systolic murmur ON6/2 at apex Abdomen - massive, pos bs, soft, liver down 4 cm Extremities - L radial 2+, others 2 +,  1 + edema Skin - dry scaling  Assessment/Plan: 1 CKD4 with mild worsening most likely from low HR and CHF on admit.  Some is natural progression and Cr rising with better regularity of bp meds.  May get a little better with stabilization of cardiac status but has limited reserve (small shrunken kidneys on U/S indic HTN a large role).  Concern however if could have thown emboli to kidneys also.  Acidemic , ^ K with op/RTA of low GFR. ANemia from renal dz and suspect ^ PTH. 2 Bradycardia better off meds but suspect signif heart dz esp with embolus 3 Hypertension: need to restart diuretics 4. Anemia needs more eval and tx 5. Metabolic Bone Disease: check PTH 6DM needs good control 7 Systemic embolus on Hep 8 Massive obesity 9 OSA 10 ^ Lipids P Dopplers, restart Lasix, give bicarb, Urine eval, PTH,   [name] L 12/16/2014, 2:56  PM

## 2014-12-16 NOTE — Progress Notes (Signed)
Pt would not let lab draw blood. Will educate pt on the importance of labs. Will notify MD

## 2014-12-16 NOTE — Progress Notes (Signed)
ANTICOAGULATION CONSULT NOTE - Follow Up Consult  Pharmacy Consult for Heparin Indication: left arm arterial thrombus  Allergies  Allergen Reactions  . Codeine Nausea And Vomiting    Patient Measurements: Height: 5\' 2"  (157.5 cm) Weight: 237 lb 14 oz (107.9 kg) IBW/kg (Calculated) : 50.1 Heparin Dosing Weight: 78 kg  Vital Signs: Temp: 97.8 F (36.6 C) (11/21 1138) Temp Source: Oral (11/21 1138) BP: 199/60 mmHg (11/21 1150) Pulse Rate: 87 (11/21 1150)  Labs:  Recent Labs  12/15/14 0800 12/16/14 0444 12/16/14 0505 12/16/14 1026  HGB 9.4*  --  9.1*  --   HCT 27.8*  --  26.8*  --   PLT 212  --  210  --   LABPROT 14.1  --   --   --   INR 1.07  --   --   --   HEPARINUNFRC  --   --   --  1.40*  CREATININE 2.74* 3.24*  --   --     Estimated Creatinine Clearance: 19.5 mL/min (by C-G formula based on Cr of 3.24).  Assessment:   POD# 1 left brachial embolectomy with patch angioplasty.   Heparin drip resumed ~2am at 1300 units/hr.  Initial heparin level is supratherapeutic (1.40) on 1300 units/hr.  Drawn from opposite arm. No bleeding reported. Was on IV heparin for several hours pre-op on 11/20, but held for OR before it was time to check heparin level.  Goal of Therapy:  Heparin level 0.3-0.7 units/ml Monitor platelets by anticoagulation protocol: Yes   Plan:   Hold heparin drip for ~1 hours.  Resume heparin drip at ~1:30pm at 1000 units/hr.  Heparin level ~8 hrs after resuming heparin.  Daily heparin level and CBC.  Will follow up anticoag plans.  Arty Baumgartner, [place] Pager: [phone] 12/16/2014,12:20 PM

## 2014-12-16 NOTE — Progress Notes (Signed)
ANTICOAGULATION CONSULT NOTE - Follow Up Consult  Pharmacy Consult for Heparin Indication: left arm arterial thrombus  Allergies  Allergen Reactions  . Codeine Nausea And Vomiting    Patient Measurements: Height: 5\' 2"  (157.5 cm) Weight: 237 lb 14 oz (107.9 kg) IBW/kg (Calculated) : 50.1 Heparin Dosing Weight: 78 kg  Vital Signs: Temp: 98.6 F (37 C) (11/21 2000) Temp Source: Oral (11/21 2000) BP: 184/73 mmHg (11/21 1935) Pulse Rate: 88 (11/21 1715)  Labs:  Recent Labs  12/15/14 0800 12/16/14 0444 12/16/14 0505 12/16/14 1026 12/16/14 2126  HGB 9.4*  --  9.1*  --   --   HCT 27.8*  --  26.8*  --   --   PLT 212  --  210  --   --   LABPROT 14.1  --   --   --   --   INR 1.07  --   --   --   --   HEPARINUNFRC  --   --   --  1.40* 0.98*  CREATININE 2.74* 3.24*  --   --   --     Estimated Creatinine Clearance: 19.5 mL/min (by C-G formula based on Cr of 3.24).  Assessment:   POD# 1 left brachial embolectomy with patch angioplasty.   Heparin running at 1000 units/hr with high level of 0.98 (was also high on previous draw with dose reduction). No bleeding issues per RN.   Goal of Therapy:  Heparin level 0.3-0.7 units/ml Monitor platelets by anticoagulation protocol: Yes   Plan:  Hold Heparin x 1 hr Resume at 2330 at a dose of 800 units/hr HL at 0600 Daily HL, CBC Monitor for s/sx of bleeding  Levester Fresh, PharmD, BCPS Clinical Pharmacist Pager [phone] 12/16/2014 10:25 PM

## 2014-12-17 ENCOUNTER — Telehealth: Payer: Self-pay | Admitting: Vascular Surgery

## 2014-12-17 ENCOUNTER — Inpatient Hospital Stay (HOSPITAL_COMMUNITY)
Admit: 2014-12-17 | Discharge: 2014-12-17 | Disposition: A | Discharge status: Home / Self Care | Payer: Medicare Other | Attending: Vascular Surgery | Admitting: Vascular Surgery

## 2014-12-17 DIAGNOSIS — I82402 Acute embolism and thrombosis of unspecified deep veins of left lower extremity: Secondary | ICD-10-CM

## 2014-12-17 DIAGNOSIS — E1100 Type 2 diabetes mellitus with hyperosmolarity without nonketotic hyperglycemic-hyperosmolar coma (NKHHC): Secondary | ICD-10-CM

## 2014-12-17 DIAGNOSIS — G4733 Obstructive sleep apnea (adult) (pediatric): Secondary | ICD-10-CM

## 2014-12-17 LAB — URINE MICROSCOPIC-ADD ON

## 2014-12-17 LAB — URINALYSIS, ROUTINE W REFLEX MICROSCOPIC
Bilirubin Urine: NEGATIVE
Glucose, UA: NEGATIVE mg/dL
Ketones, ur: NEGATIVE mg/dL
LEUKOCYTES UA: NEGATIVE
Nitrite: NEGATIVE
Protein, ur: 30 mg/dL — AB
SPECIFIC GRAVITY, URINE: 1.013 (ref 1.005–1.030)
pH: 5 (ref 5.0–8.0)

## 2014-12-17 LAB — COMPREHENSIVE METABOLIC PANEL
ALBUMIN: 2.4 g/dL — AB (ref 3.5–5.0)
ALK PHOS: 64 U/L (ref 38–126)
ALT: 34 U/L (ref 14–54)
AST: 180 U/L — AB (ref 15–41)
Anion gap: 14 (ref 5–15)
BUN: 50 mg/dL — AB (ref 6–20)
CALCIUM: 8.1 mg/dL — AB (ref 8.9–10.3)
CHLORIDE: 104 mmol/L (ref 101–111)
CO2: 20 mmol/L — AB (ref 22–32)
CREATININE: 3.94 mg/dL — AB (ref 0.44–1.00)
GFR calc Af Amer: 13 mL/min — ABNORMAL LOW (ref >60)
GFR calc non Af Amer: 11 mL/min — ABNORMAL LOW (ref >60)
GLUCOSE: 124 mg/dL — AB (ref 65–99)
Potassium: 3.9 mmol/L (ref 3.5–5.1)
SODIUM: 138 mmol/L (ref 135–145)
Total Bilirubin: 0.8 mg/dL (ref 0.3–1.2)
Total Protein: 5.5 g/dL — ABNORMAL LOW (ref 6.5–8.1)

## 2014-12-17 LAB — HEPARIN LEVEL (UNFRACTIONATED)
HEPARIN UNFRACTIONATED: 0.54 [IU]/mL (ref 0.30–0.70)
Heparin Unfractionated: 0.62 IU/mL (ref 0.30–0.70)

## 2014-12-17 LAB — CULTURE, BLOOD (ROUTINE X 2): Culture: NO GROWTH

## 2014-12-17 LAB — PHOSPHORUS: Phosphorus: 5.6 mg/dL — ABNORMAL HIGH (ref 2.5–4.6)

## 2014-12-17 LAB — PREPARE RBC (CROSSMATCH)

## 2014-12-17 LAB — ABO/RH: ABO/RH(D): B POS

## 2014-12-17 LAB — CBC
HEMATOCRIT: 20 % — AB (ref 36.0–46.0)
HEMATOCRIT: 20.7 % — AB (ref 36.0–46.0)
HEMOGLOBIN: 6.9 g/dL — AB (ref 12.0–15.0)
Hemoglobin: 7.1 g/dL — ABNORMAL LOW (ref 12.0–15.0)
MCH: 31.1 pg (ref 26.0–34.0)
MCH: 31.3 pg (ref 26.0–34.0)
MCHC: 34.5 g/dL (ref 30.0–36.0)
MCHC: 34.3 g/dL (ref 30.0–36.0)
MCV: 90.1 fL (ref 78.0–100.0)
MCV: 91.2 fL (ref 78.0–100.0)
Platelets: 177 10*3/uL (ref 150–400)
Platelets: 171 10*3/uL (ref 150–400)
RBC: 2.22 MIL/uL — AB (ref 3.87–5.11)
RBC: 2.27 MIL/uL — ABNORMAL LOW (ref 3.87–5.11)
RDW: 13.1 % (ref 11.5–15.5)
RDW: 13.5 % (ref 11.5–15.5)
WBC: 11.1 10*3/uL — AB (ref 4.0–10.5)
WBC: 10.8 10*3/uL — ABNORMAL HIGH (ref 4.0–10.5)

## 2014-12-17 LAB — MAGNESIUM: Magnesium: 1.9 mg/dL (ref 1.7–2.4)

## 2014-12-17 LAB — GLUCOSE, CAPILLARY
GLUCOSE-CAPILLARY: 124 mg/dL — AB (ref 65–99)
GLUCOSE-CAPILLARY: 159 mg/dL — AB (ref 65–99)
Glucose-Capillary: 157 mg/dL — ABNORMAL HIGH (ref 65–99)
Glucose-Capillary: 109 mg/dL — ABNORMAL HIGH (ref 65–99)

## 2014-12-17 LAB — HEMOGLOBIN AND HEMATOCRIT, BLOOD
HEMATOCRIT: 24.5 % — AB (ref 36.0–46.0)
Hemoglobin: 8.4 g/dL — ABNORMAL LOW (ref 12.0–15.0)

## 2014-12-17 LAB — CREATININE, URINE, RANDOM: Creatinine, Urine: 61.58 mg/dL

## 2014-12-17 LAB — RETICULOCYTES
RBC.: 2.27 MIL/uL — AB (ref 3.87–5.11)
RETIC COUNT ABSOLUTE: 102.2 10*3/uL (ref 19.0–186.0)
RETIC CT PCT: 4.5 % — AB (ref 0.4–3.1)

## 2014-12-17 LAB — IRON AND TIBC
Iron: 275 ug/dL — ABNORMAL HIGH (ref 28–170)
Saturation Ratios: 153 % — ABNORMAL HIGH (ref 10.4–31.8)
TIBC: 179 ug/dL — ABNORMAL LOW (ref 250–450)

## 2014-12-17 LAB — CK: CK TOTAL: 14037 U/L — AB (ref 38–234)

## 2014-12-17 LAB — SODIUM, URINE, RANDOM: Sodium, Ur: 80 mmol/L

## 2014-12-17 LAB — VITAMIN B12: VITAMIN B 12: 427 pg/mL (ref 180–914)

## 2014-12-17 LAB — FERRITIN: FERRITIN: 326 ng/mL — AB (ref 11–307)

## 2014-12-17 LAB — FOLATE: Folate: 8.7 ng/mL (ref >5.9)

## 2014-12-17 MED ORDER — DARBEPOETIN ALFA 150 MCG/0.3ML IJ SOSY
150.0000 ug | PREFILLED_SYRINGE | Freq: Once | INTRAMUSCULAR | Status: AC
Start: 2014-12-17 — End: 2014-12-17
  Administered 2014-12-17: 150 ug via SUBCUTANEOUS
  Filled 2014-12-17: qty 0.3

## 2014-12-17 MED ORDER — ALBUTEROL SULFATE (2.5 MG/3ML) 0.083% IN NEBU
2.5000 mg | INHALATION_SOLUTION | Freq: Every day | RESPIRATORY_TRACT | Status: DC
  Administered 2014-12-17 – 2014-12-19 (×3): 2.5 mg via RESPIRATORY_TRACT
  Filled 2014-12-17 (×3): qty 3

## 2014-12-17 MED ORDER — CLONIDINE HCL 0.1 MG PO TABS
0.1000 mg | ORAL_TABLET | Freq: Once | ORAL | Status: AC
  Administered 2014-12-17: 0.1 mg via ORAL
  Filled 2014-12-17: qty 1

## 2014-12-17 MED ORDER — HEPARIN SODIUM (PORCINE) 5000 UNIT/ML IJ SOLN
5000.0000 [IU] | Freq: Three times a day (TID) | INTRAMUSCULAR | Status: DC
  Administered 2014-12-17 – 2014-12-19 (×5): 5000 [IU] via SUBCUTANEOUS
  Filled 2014-12-17 (×5): qty 1

## 2014-12-17 MED ORDER — ONDANSETRON HCL 4 MG/2ML IJ SOLN
4.0000 mg | Freq: Once | INTRAMUSCULAR | Status: AC
  Administered 2014-12-17: 4 mg via INTRAVENOUS

## 2014-12-17 MED ORDER — CALCIUM ACETATE (PHOS BINDER) 667 MG PO CAPS
667.0000 mg | ORAL_CAPSULE | Freq: Three times a day (TID) | ORAL | Status: DC
  Administered 2014-12-17 – 2014-12-21 (×10): 667 mg via ORAL
  Filled 2014-12-17 (×12): qty 1

## 2014-12-17 MED ORDER — SODIUM CHLORIDE 0.9 % IV SOLN
Freq: Once | INTRAVENOUS | Status: AC
  Administered 2014-12-17: 17:00:00 via INTRAVENOUS

## 2014-12-17 MED ORDER — DIPHENHYDRAMINE HCL 50 MG/ML IJ SOLN: 25.0000 mg | Freq: Four times a day (QID) | INTRAMUSCULAR | Status: DC | PRN

## 2014-12-17 MED ORDER — SODIUM CHLORIDE 0.9 % IV SOLN
510.0000 mg | Freq: Once | INTRAVENOUS | Status: AC
  Administered 2014-12-17: 510 mg via INTRAVENOUS
  Filled 2014-12-17 (×2): qty 17

## 2014-12-17 NOTE — Progress Notes (Signed)
ANTICOAGULATION CONSULT NOTE - Follow Up Consult  Pharmacy Consult for heparin Indication: arterial thrombus   Labs:  Recent Labs  12/15/14 0800 12/16/14 0444 12/16/14 0505 12/16/14 1026 12/16/14 2126 12/17/14 0608  HGB 9.4*  --  9.1*  --   --   --   HCT 27.8*  --  26.8*  --   --   --   PLT 212  --  210  --   --   --   LABPROT 14.1  --   --   --   --   --   INR 1.07  --   --   --   --   --   HEPARINUNFRC  --   --   --  1.40* 0.98* 0.62  CREATININE 2.74* 3.24*  --   --   --   --      Assessment/Plan:  69yo female therapeutic on heparin after rate changes. Will continue gtt at current rate and confirm stable with additional level.   Wynona Neat, PharmD, BCPS  12/17/2014,6:58 AM

## 2014-12-17 NOTE — Clinical Social Work Note (Cosign Needed)
Clinical Social Work Assessment  Patient Details  Name: [name] MRN: [id number] Date of Birth: February 05, 1945  Date of referral:  12/17/14               Reason for consult:  Facility Placement                Permission sought to share information with:  Facility Sport and exercise psychologist, Family Supports Permission granted to share information::  Yes, Verbal Permission Granted  Name::     [name]   Agency::  SNF  Relationship::  Daughter  Contact Information:  [phone]  Housing/Transportation Living arrangements for the past 2 months:  Apartment Source of Information:  Patient Patient Interpreter Needed:  None Criminal Activity/Legal Involvement Pertinent to Current Situation/Hospitalization:  No - Comment as needed Significant Relationships:  Adult Children Lives with:  Self Do you feel safe going back to the place where you live?  Yes Need for family participation in patient care:  Yes (Comment)  Care giving concerns:  Patient lives at home alone. Patient already receives help at home from a home health nurse, but the patient needs at discharge will likely need the care of a SNF facility.    Social Worker assessment / plan:  BSW intern met with patient at bedside to complete assessment. BSW intern explained to the patient what a skilled nursing facility offers and the process of a referral. Patient was agreeable to SNF. Patient stated that she lived at home alone, but there was a nurse that came in and helped her every few days. Patient stated that she has a daughter that lives in [place] and the patient daughter transports mother to appointments. Social worker will follow up with bed offers and will continue to follow and assess as needed.   Employment status:  Retired Nurse, adult PT Recommendations:  [place] / Referral to community resources:  [place]  Patient/Family's Response to care:   Patient said she was satisfied with the care that she is receiving at the hospital.  Patient/Family's Understanding of and Emotional Response to Diagnosis, Current Treatment, and Prognosis:  Patient was very pleasant and accepting to the fact that a SNF was an option at discharge. Patient stated that she would look over the list. Patient seemed to have understood her reason for hospitalization, current treatment, and patient needs at discharge.  Emotional Assessment Appearance:  Appears stated age Attitude/Demeanor/Rapport:  Other (Pleasant) Affect (typically observed):  Appropriate, Pleasant, Calm Orientation:  Oriented to Self, Oriented to Place, Oriented to  Time, Oriented to Situation Alcohol / Substance use:  Not Applicable Psych involvement (Current and /or in the community):  No (Comment)  Discharge Needs  Concerns to be addressed:  No discharge needs identified Readmission within the last 30 days:  No Current discharge risk:  Lives alone Barriers to Discharge:  Continued Medical Work up    [employer], [phone]

## 2014-12-17 NOTE — Progress Notes (Signed)
OT Cancellation Note  Patient Details Name: HARLIN VANDERKOOI MRN: [id number] DOB: 11-Apr-1945   Cancelled Treatment:    Reason Eval/Treat Not Completed: Medical issues which prohibited therapy. Pt's hemoglobin is 6.9. Benito Mccreedy OTR/L [id number] 12/17/2014, 9:06 AM

## 2014-12-17 NOTE — Progress Notes (Signed)
Subjective: Interval History: has complaints arm sore.  Objective: Vital signs in last 24 hours: Temp:  [97.8 F (36.6 C)-98.6 F (37 C)] 98.5 F (36.9 C) (11/22 0836) Pulse Rate:  [84-88] 88 (11/21 1715) Resp:  [12-20] 18 (11/22 0525) BP: (146-216)/(48-78) 146/48 mmHg (11/22 0525) SpO2:  [94 %-100 %] 97 % (11/21 1715) Weight:  [110.043 kg (242 lb 9.6 oz)] 110.043 kg (242 lb 9.6 oz) (11/22 0630) Weight change: 2.143 kg (4 lb 11.6 oz)  Intake/Output from previous day: 11/21 0701 - 11/22 0700 In: 2121 [P.O.:600; I.V.:1521] Out: 800 [Urine:800] Intake/Output this shift: Total I/O In: 120 [P.O.:120] Out: -   General appearance: cooperative and morbidly obese Resp: diminished breath sounds bilaterally Cardio: S1, S2 normal and systolic murmur: holosystolic 2/6, blowing at apex GI: obese, pos bs,liver down 5 cm Extremities: edema 2+  Lab Results:  Recent Labs  12/16/14 0505 12/17/14 0608  WBC 9.9 11.1*  HGB 9.1* 6.9*  HCT 26.8* 20.0*  PLT 210 177   BMET:  Recent Labs  12/16/14 0444 12/16/14 1620 12/17/14 0608  NA 136  --  138  K 6.0* 4.2 3.9  CL 105  --  104  CO2 17*  --  20*  GLUCOSE 167*  --  124*  BUN 45*  --  50*  CREATININE 3.24*  --  3.94*  CALCIUM 8.5*  --  8.1*   No results for input(s): PTH in the last 72 hours. Iron Studies:  Recent Labs  12/16/14 1620  IRON 36  TIBC 190*    Studies/Results: No results found.  I have reviewed the patient's current medications.  Assessment/Plan: 1 CKD 4 with AKI Cr rising appears to be hemodynamic with low perfusion from brady, and better bps.  Needs to diurese . Dopplers pending, U/s small kidneys, FENA not helpful, UA not revealing 2 anemia lower ?? Op vs bleeding elsewhere.  Needs Fe, esa also 3 HPTH pending needs binder 4 Obesity 5 brady off chronotropic meds 6 DM controlled 7 embolus to arm per surgery , source ??? On hep 8 Sz disorder  P lasix , bp control , f/u PTH, add binder   LOS: 6 days    Luke Falero L 12/17/2014,9:31 AM

## 2014-12-17 NOTE — Progress Notes (Signed)
PA in to see pt, Venous Dupplex ordered, heparin turned back on.

## 2014-12-17 NOTE — Progress Notes (Signed)
PT Cancellation Note  Patient Details Name: [name] MRN: [id number] DOB: Dec 30, 1945   Cancelled Treatment:    Reason Eval/Treat Not Completed: Medical issues which prohibited therapy.  Pt with Hgb of 6.9.  Will f/u as appropriate.   Keerthi Hazell, Thornton Papas 12/17/2014, 9:10 AM

## 2014-12-17 NOTE — Progress Notes (Signed)
CRITICAL VALUE ALERT  Critical value received:  HGB 6.9  Date of notification:  12/17/14  Time of notification:  0839  Critical value read back:Yes.    Nurse who received alert:  Mickey Farber  MD notified (1st page):  Candiss Norse  Time of first page:  [phone]  MD notified (2nd page):  Time of second page:  Responding MD:    Time MD responded:

## 2014-12-17 NOTE — Progress Notes (Signed)
Patient Demographics:    [name], is a 69 y.o. female, DOB - [birth year], [id number]  Admit date - 12/11/2014   Admitting Physician [name], MD  Outpatient Primary MD for the patient is [name],[name], MD  LOS - 6  Summary  This is a pleasant 69 year old African-American female with past medical history of chronic kidney disease stage IV, diabetes mellitus, essential hypertension, morbid obesity, chronic diastolic heart failure who was admitted to the hospital initially due to nausea vomiting along with mild acute renal failure also was by constipation. This was stabilized and she was supposed to be discharged however on the day of anticipated discharge 2 days ago she developed sudden onset of left arm pain early in the morning around 5:30 AM.   Further workup showed that she had loss of blood flow, vascular surgery was emergently consulted, she was started on heparin drip and taken to the OR for left brachial artery embolectomy. She is currently on heparin drip with close vascular surgery follow-up, she still has weakness in the left arm, she has now developed some swelling in the left arm with drop in hemoglobin and hematocrit. Will be transfused 1 unit of packed RBC on 12/17/2014, vascular surgery to address left arm swelling with a drop in H&H. They have already seen the patient today.   Chief Complaint  Patient presents with  . Emesis  . Bradycardia        Subjective:    Healy Furtak today has, No headache, No chest pain, No abdominal pain - No Nausea, No new weakness tingling or numbness, No Cough - SOB.  Left arm pain better nausea improved   Assessment  & Plan :     1.N&V - most likely due to underlying constipation, KUB shows stool burden but no obstruction, place on bowel  regimen and monitor. Currently nausea free. As needed IV Zofran. Initiated bowel regimen for constipation. Now symptom free and feels better.   2. Bradycardia upon admission. Medication related, beta blocker and clonidine held, heart rate better. TSH stable. Echo unremarkable. Cardiology on board and has signed off. Currently in sinus. Blood pressure stable.   3. ARF and CK D stage IV. Baseline creatinine close to 2.2, likely due to nausea vomiting dehydration, creatinine peaked at 4. Continue gentle hydration and monitor renal function. Transfuse 1 packed RBC on 12/17/2014. Nephrology following.    4. UTI. Treated with fluconazole and Rocephin. Stable Renal US.   5. Essential hypertension. Currently on Norvasc and hydralazine continued.   6. Chronic diastolic dysfunction with EF 55-60%. Clinically compensated monitor of diuretic. Mild troponin elevation not an ACS pattern, EKG nonacute, on aspirin, cannot use beta blocker due to bradycardia, no further workup per cardiology who has seen the patient. Chest pain-free.   7. 1 out of 2 blood cultures positive for gram-positive cocci, confirmed coag negative staph which is contamination. Stop all antibiotics.   8. Left arm weakness/numbness started 5:30 AM 12/15/2014. On exam, appears cold with weak radial pulse, stat arterial duplex obtained with evidence of loss of blood flow, heparin drip started, continue aspirin, vascular surgery on board underwent emergent left brachial embolectomy on 12/15/2014 by vascular surgery, continues to be on heparin drip. Left arm is weak. Initiate PT  OT, further management per vascular surgery.  Have noted increased swelling in the left arm with drop in H&H, vascular surgery aware. Seen the patient already this morning. We'll defer further management to vascular surgery.   9. Anemia. Transfuse 1 packed RBC on 12/17/2014, anemia panel, monitor H&H. Further management as a #8 above.    Code Status :  Full  Family Communication  : none  Disposition Plan  : HHPT versus SNF to be decided  Consults  : Cards, vascular surgery, renal  Procedures  :   TTE  Left ventricle: The cavity size was normal. Wall thickness wasincreased in a pattern of moderate LVH. Systolic function wasnormal. The estimated ejection fraction was in the range of 55%to 60%. Wall motion was normal; there were no regional wallmotion abnormalities. - Aortic valve: Valve area (VTI): 1.97 cm^2. Valve area (Vmax): 1.77 cm^2. Valve area (Vmean): 2.09 cm^2. - Left atrium: The atrium was mildly dilated.  Vascular Ultrasound  Upper Extremity Arterial Duplex has been completed. Preliminary findings: Left = No arterial flow visualized throughout left upper extremity. The left subclavian, axillary, brachial, radial, and ulnar arteries were evaluated.  Right subclavian artery was compared and demonstrates biphasic flow.  Left arm brachial embolectomy on 12/15/2014 by Dr. Scot Dock.  Renal US - Stable   DVT Prophylaxis  :   Heparin    Lab Results  Component Value Date   PLT 177 12/17/2014    Inpatient Medications  Scheduled Meds: . sodium chloride   Intravenous Once  . albuterol  2.5 mg Nebulization QHS  . amLODipine  10 mg Oral Daily  . antiseptic oral rinse  7 mL Mouth Rinse BID  . aspirin  81 mg Oral Daily  . bisacodyl  10 mg Oral q morning - 10a  . calcium acetate  667 mg Oral TID WC  . carbamazepine  400 mg Oral BID  . citalopram  20 mg Oral Daily  . darbepoetin (ARANESP) injection - NON-DIALYSIS  150 mcg Subcutaneous Once  . docusate sodium  200 mg Oral BID  . ezetimibe-simvastatin  1 tablet Oral QHS  . ferumoxytol  510 mg Intravenous Once  . furosemide  80 mg Oral BID  . hydrALAZINE  100 mg Oral 3 times per day  . insulin aspart  0-15 Units Subcutaneous TID WC  . insulin aspart  4 Units Subcutaneous TID WC  . insulin glargine  15 Units Subcutaneous QHS  . lacosamide  100 mg Oral BID  .  nitroGLYCERIN  0.5 inch Topical 4 times per day  . pantoprazole  40 mg Oral Daily  . sodium bicarbonate  1,300 mg Oral BID   Continuous Infusions: . heparin 800 Units/hr (12/17/14 0317)   PRN Meds:.acetaminophen **OR** [DISCONTINUED] acetaminophen, clobetasol cream, diphenhydrAMINE, hydrALAZINE, metoprolol, ondansetron (ZOFRAN) IV, oxyCODONE-acetaminophen, senna-docusate  Antibiotics  :     Anti-infectives    Start     Dose/Rate Route Frequency Ordered Stop   12/16/14 0600  ceFAZolin (ANCEF) IVPB 1 g/50 mL premix  Status:  Discontinued    Comments:  Send with pt to OR   1 g 100 mL/hr over 30 Minutes Intravenous To ShortStay Surgical 12/15/14 1720 12/15/14 2056   12/15/14 2045  vancomycin (VANCOCIN) 1,500 mg in sodium chloride 0.9 % 500 mL IVPB  Status:  Discontinued     1,500 mg 250 mL/hr over 120 Minutes Intravenous Every 48 hours 12/15/14 0921 12/16/14 1117   12/13/14 2045  vancomycin (VANCOCIN) IVPB 1000 mg/200 mL premix  Status:  Discontinued     1,000 mg 200 mL/hr over 60 Minutes Intravenous Every 48 hours 12/13/14 2031 12/15/14 0921   12/12/14 0800  cefTRIAXone (ROCEPHIN) 1 g in dextrose 5 % 50 mL IVPB  Status:  Discontinued     1 g 100 mL/hr over 30 Minutes Intravenous Every 24 hours 12/12/14 0725 12/16/14 1117   12/11/14 1230  fluconazole (DIFLUCAN) tablet 100 mg  Status:  Discontinued     100 mg Oral Daily 12/11/14 1222 12/16/14 1117        Objective:   Filed Vitals:   12/17/14 0320 12/17/14 0525 12/17/14 0630 12/17/14 0836  BP: 161/49 146/48    Pulse:      Temp:   97.9 F (36.6 C) 98.5 F (36.9 C)  TempSrc:   Oral Axillary  Resp: 20 18    Height:      Weight:   110.043 kg (242 lb 9.6 oz)   SpO2:        Wt Readings from Last 3 Encounters:  12/17/14 110.043 kg (242 lb 9.6 oz)  11/22/14 109.68 kg (241 lb 12.8 oz)  07/24/14 113.445 kg (250 lb 1.6 oz)     Intake/Output Summary (Last 24 hours) at 12/17/14 1021 Last data filed at 12/17/14 0830  Gross per 24  hour  Intake   2241 ml  Output    800 ml  Net   1441 ml     Physical Exam  Awake Alert, Oriented X 3, No new F.N deficits, Normal affect [place].AT,PERRAL Supple Neck,No JVD, No cervical lymphadenopathy appriciated.  Symmetrical Chest wall movement, Good air movement bilaterally, CTAB RRR,No Gallops,Rubs or new Murmurs, No Parasternal Heave +ve B.Sounds, Abd Soft, No tenderness, No organomegaly appriciated, No rebound - guarding or rigidity. No Cyanosis, Clubbing or edema, No new Rash or bruise,   left arm is his warm with improved radial pulsation but still weak     Data Review:   Micro Results Recent Results (from the past 240 hour(s))  Urine culture     Status: None   Collection Time: 12/11/14  8:58 AM  Result Value Ref Range Status   Specimen Description URINE, RANDOM  Final   Special Requests NONE  Final   Culture MULTIPLE SPECIES PRESENT, SUGGEST RECOLLECTION  Final   Report Status 12/13/2014 FINAL  Final  Culture, blood (routine x 2)     Status: None   Collection Time: 12/12/14  4:50 PM  Result Value Ref Range Status   Specimen Description BLOOD RIGHT ARM  Final   Special Requests IN PEDIATRIC BOTTLE 1CC  Final   Culture  Setup Time   Final    GRAM POSITIVE COCCI IN CLUSTERS AEROBIC BOTTLE ONLY CRITICAL RESULT CALLED TO, READ BACK BY AND VERIFIED WITH: Dolores Hoose RN 2014 12/13/14 A BROWNING    Culture   Final    STAPHYLOCOCCUS SPECIES (COAGULASE NEGATIVE) THE SIGNIFICANCE OF ISOLATING THIS ORGANISM FROM A SINGLE SET OF BLOOD CULTURES WHEN MULTIPLE SETS ARE DRAWN IS UNCERTAIN. PLEASE NOTIFY THE MICROBIOLOGY DEPARTMENT WITHIN ONE WEEK IF SPECIATION AND SENSITIVITIES ARE REQUIRED.    Report Status 12/16/2014 FINAL  Final  Culture, blood (routine x 2)     Status: None (Preliminary result)   Collection Time: 12/12/14  4:59 PM  Result Value Ref Range Status   Specimen Description BLOOD RIGHT ANTECUBITAL  Final   Special Requests BOTTLES DRAWN AEROBIC AND ANAEROBIC 5CC   Final   Culture NO GROWTH 4 DAYS  Final   Report Status  PENDING  Incomplete  Surgical pcr screen     Status: None   Collection Time: 12/15/14  9:06 AM  Result Value Ref Range Status   MRSA, PCR NEGATIVE NEGATIVE Final   Staphylococcus aureus NEGATIVE NEGATIVE Final    Comment:        The Xpert SA Assay (FDA approved for NASAL specimens in patients over 69 years of age), is one component of a comprehensive surveillance program.  Test performance has been validated by [facility] for patients greater than or equal to 57 year old. It is not intended to diagnose infection nor to guide or monitor treatment.     Radiology Reports Dg Chest 2 View  12/14/2014  CLINICAL DATA:  Patient with shortness of breath. EXAM: CHEST  2 VIEW COMPARISON:  Chest radiograph 12/13/2014 FINDINGS: Monitoring leads overlie the patient. Stable enlarged cardiac and mediastinal contours. Elevation of the right hemidiaphragm. No consolidative pulmonary opacities. No pleural effusion or pneumothorax. Regional skeleton is unremarkable. IMPRESSION: No acute cardiopulmonary process. Cardiomegaly. Electronically Signed   By: Lovey Newcomer M.D.   On: 12/14/2014 14:43   Dg Chest 2 View  12/11/2014  CLINICAL DATA:  Bradycardia and vomiting since yesterday. EXAM: CHEST  2 VIEW COMPARISON:  None. FINDINGS: Enlarged cardiac silhouette similar to prior. Exam is lordotic which exaggerates the cardiac silhouette. Interval increase in central venous congestion. Low lung volumes. No pleural fluid. IMPRESSION: Cardiomegaly and increased central venous congestion. Electronically Signed   By: Suzy Bouchard M.D.   On: 12/11/2014 09:31   US Renal  12/12/2014  CLINICAL DATA:  Acute renal failure EXAM: RENAL / URINARY TRACT ULTRASOUND COMPLETE COMPARISON:  None. FINDINGS: Right Kidney: Length: 9.7 cm. Echogenicity within normal limits. No mass or hydronephrosis visualized. Left Kidney: Length: 9.2 cm. Echogenicity within normal limits.  No mass or hydronephrosis visualized. Bladder: Decompressed following voiding IMPRESSION: No acute abnormality noted. Electronically Signed   By: Inez Catalina M.D.   On: 12/12/2014 16:56   Dg Chest Port 1 View  12/13/2014  CLINICAL DATA:  Shortness of breath. EXAM: PORTABLE CHEST 1 VIEW COMPARISON:  December 12, 2014 FINDINGS: Stable mild cardiomegaly is noted with mild central pulmonary vascular congestion. No pneumothorax or pleural effusion is noted. Both lungs are clear. Osteophyte formation is noted in mid thoracic spine. IMPRESSION: Stable mild cardiomegaly with central pulmonary vascular congestion. No significant change compared to prior exam. Electronically Signed   By: Marijo Conception, M.D.   On: 12/13/2014 07:55   Dg Chest Port 1 View  12/12/2014  CLINICAL DATA:  Shortness of breath for 2 days EXAM: PORTABLE CHEST 1 VIEW COMPARISON:  12/11/2014 FINDINGS: Cardiomegaly again noted. Mild thoracic dextroscoliosis. Degenerative changes thoracic spine. Central mild vascular congestion without pulmonary edema. No focal infiltrate IMPRESSION: Cardiomegaly. Mild thoracic dextroscoliosis. Central vascular congestion without convincing pulmonary edema. No segmental infiltrate. Electronically Signed   By: Lahoma Crocker M.D.   On: 12/12/2014 08:00   Dg Abd Portable 1v  12/12/2014  CLINICAL DATA:  Nausea EXAM: PORTABLE ABDOMEN - 1 VIEW COMPARISON:  CT abdomen 08/06/2009 FINDINGS: Retained stool in the colon. Negative for bowel obstruction. No renal calculi. Lumbar degenerative change at L4-5 and L5-S1. No acute skeletal abnormality IMPRESSION: Constipation without bowel obstruction. Electronically Signed   By: Franchot Gallo M.D.   On: 12/12/2014 10:28     CBC  Recent Labs Lab 12/11/14 0835  12/12/14 0042 12/13/14 0612 12/15/14 0800 12/16/14 0505 12/17/14 0608  WBC 7.1  < > 9.4 7.3  8.7 9.9 11.1*  HGB 10.2*  < > 9.6* 8.9* 9.4* 9.1* 6.9*  HCT 30.4*  < > 27.6* 25.8* 27.8* 26.8* 20.0*  PLT 197  < >  176 162 212 210 177  MCV 89.9  < > 89.6 89.6 89.4 89.9 90.1  MCH 30.2  < > 31.2 30.9 30.2 30.5 31.1  MCHC 33.6  < > 34.8 34.5 33.8 34.0 34.5  RDW 12.2  < > 12.5 12.5 12.5 12.9 13.1  LYMPHSABS 0.9  --   --   --   --   --   --   MONOABS 0.9  --   --   --   --   --   --   EOSABS 0.0  --   --   --   --   --   --   BASOSABS 0.0  --   --   --   --   --   --   < > = values in this interval not displayed.  Chemistries   Recent Labs Lab 12/11/14 0835  12/12/14 0042 12/13/14 0612 12/15/14 0800 12/16/14 0444 12/16/14 1620 12/17/14 0608  NA 137  --  135 137 134* 136  --  138  K 4.5  --  4.1 4.3 4.3 6.0* 4.2 3.9  CL 103  --  102 103 99* 105  --  104  CO2 24  --  21* 25 21* 17*  --  20*  GLUCOSE 202*  --  132* 92 273* 167*  --  124*  BUN 58*  --  60* 53* 39* 45*  --  50*  CREATININE 4.02*  < > 3.66* 3.35* 2.74* 3.24*  --  3.94*  CALCIUM 8.6*  --  8.3* 8.3* 9.0 8.5*  --  8.1*  MG  --   --   --   --   --  2.2  --  1.9  AST 20  --   --   --   --   --   --  180*  ALT 15  --   --   --   --   --   --  34  ALKPHOS 95  --   --   --   --   --   --  64  BILITOT 0.7  --   --   --   --   --   --  0.8  < > = values in this interval not displayed. ------------------------------------------------------------------------------------------------------------------ estimated creatinine clearance is 16.2 mL/min (by C-G formula based on Cr of 3.94). ------------------------------------------------------------------------------------------------------------------ No results for input(s): HGBA1C in the last 72 hours. ------------------------------------------------------------------------------------------------------------------ No results for input(s): CHOL, HDL, LDLCALC, TRIG, CHOLHDL, LDLDIRECT in the last 72 hours. ------------------------------------------------------------------------------------------------------------------ No results for input(s): TSH, T4TOTAL, T3FREE, THYROIDAB in the last 72  hours.  Invalid input(s): FREET3 ------------------------------------------------------------------------------------------------------------------  Recent Labs  12/16/14 1620  TIBC 190*  IRON 36    Coagulation profile  Recent Labs Lab 12/15/14 0800  INR 1.07    No results for input(s): DDIMER in the last 72 hours.  Cardiac Enzymes  Recent Labs Lab 12/11/14 1305 12/11/14 1905 12/12/14 0042  TROPONINI 0.49* 0.57* 0.49*   ------------------------------------------------------------------------------------------------------------------ Invalid input(s): POCBNP   Time Spent in minutes 35   Kord Monette K M.D on 12/17/2014 at 10:21 AM  Between 7am to 7pm - Pager - [phone]  After 7pm go to [email or web address] - password [facility]  [facility] -  Office  [phone]

## 2014-12-17 NOTE — Telephone Encounter (Signed)
-----   Message from Mena Goes, RN sent at 12/16/2014  5:00 PM EST ----- Regarding: schedule   ----- Message -----    From: Alvia Grove, PA-C    Sent: 12/16/2014   3:41 PM      To: [facility] Charge Pool  S/p left brachial embolectomy and vein patch angioplasty using cephalic vein 0000000  F/u with CSD in 2 weeks  Thanks Maudie Mercury

## 2014-12-17 NOTE — Progress Notes (Signed)
Left arm very swollen this morning upon assessment. Arm is very tender per pt report. PA paged to come see pt, Heparin turned off. Swelling is throughout the entire extremity. MD also notified.

## 2014-12-17 NOTE — Telephone Encounter (Signed)
LM for pt re appt, dpm °

## 2014-12-17 NOTE — Progress Notes (Signed)
ANTICOAGULATION CONSULT NOTE - Follow Up Consult  Pharmacy Consult for Heparin Indication: left arm arterial thrombus  Allergies  Allergen Reactions  . Codeine Nausea And Vomiting    Patient Measurements: Height: 5\' 2"  (157.5 cm) Weight: 242 lb 9.6 oz (110.043 kg) IBW/kg (Calculated) : 50.1 Heparin Dosing Weight: 78 kg  Vital Signs: Temp: 97.7 F (36.5 C) (11/22 1301) Temp Source: Oral (11/22 1301) BP: 146/48 mmHg (11/22 0525)  Labs:  Recent Labs  12/15/14 0800 12/16/14 0444 12/16/14 0505  12/16/14 2126 12/17/14 0608 12/17/14 1211  HGB 9.4*  --  9.1*  --   --  6.9* 7.1*  HCT 27.8*  --  26.8*  --   --  20.0* 20.7*  PLT 212  --  210  --   --  177 171  LABPROT 14.1  --   --   --   --   --   --   INR 1.07  --   --   --   --   --   --   HEPARINUNFRC  --   --   --   < > 0.98* 0.62 0.54  CREATININE 2.74* 3.24*  --   --   --  3.94*  --   CKTOTAL  --   --   --   --   --   --  14037*  < > = values in this interval not displayed.  Estimated Creatinine Clearance: 16.2 mL/min (by C-G formula based on Cr of 3.94).  Assessment:   POD# 2 left brachial embolectomy with patch angioplasty.   Heparin level is mid-range therapeutic (0.54) on 800 units/hr.   Had 2 high heparin levels and dose reductions on 11/21, but level now therapeutic x 2.   Drop in Hgb as noted today. Left arm swelling and tenderness evaluated by [facility]. Duplex scan done. Mild oozing at incision site this am, but resolved quickly and no further oozing per patient.  Expecting 3 months anticoagulation.   For 1 unit PRBCs today.  Aranesp 150 mcg sq and Feraheme 510 mg IV given today.     Goal of Therapy:  Heparin level 0.3-0.7 units/ml Monitor platelets by anticoagulation protocol: Yes   Plan:   Continue heparin drip at 800 units/hr  Daily heparin level and CBC, next in am.  Will follow up anticoag plans.  Arty Baumgartner, [place] Pager: [phone] 12/17/2014,1:56 PM

## 2014-12-17 NOTE — Progress Notes (Signed)
RN notified [facility] Renal Artery Stenosis [place], stated they did not have order. Order corrected, but pt has to be NPO for 8 hours prior to. Order placed, they said they will complete in the am.

## 2014-12-17 NOTE — Progress Notes (Signed)
Preliminary report by tech - Left Upper Ext. Venous Duplex Completed. Technically challenged and limited study due to poor visualization of the venous system. There was no evidence of acute deep vein thrombosis in the vein that were clearly visualized. The subclavian and axillary veins were not visualized in all segments.  Oda Cogan, BS, RDMS, RVT

## 2014-12-17 NOTE — Progress Notes (Addendum)
[facility] Progress Note  Subjective  - POD #2  Called about left arm swelling and tenderness and oozing from left arm incision. Per patient, left hand feels about the same as yesterday, improved from pre-op.   Objective Filed Vitals:   12/17/14 0525 12/17/14 0630  BP: 146/48   Pulse:    Temp:  97.9 F (36.6 C)  Resp: 18     Intake/Output Summary (Last 24 hours) at 12/17/14 0833 Last data filed at 12/17/14 0700  Gross per 24 hour  Intake   2121 ml  Output    800 ml  Net   1321 ml   2+ left radial pulse. Left hand swelling. Left arm incision clean and intact. No active bleeding. Left forearm soft, left proximal arm swollen. Sensation intact left hand. Decreased grip strength due to swelling.  No neck or facial swelling.   Assessment/Planning: 69 y.o. female is s/p: emergent left brachial embolectomy 2 Days Post-Op   Thromboembolism LUE: palpable left radial pulse, will need anticoagulation for at least 3 months.  Left arm swelling: venous duplex to rule out DVT. Continue heparin.   Alvia Grove 12/17/2014 8:33 AM --  Laboratory CBC    Component Value Date/Time   WBC 9.9 12/16/2014 0505   HGB 9.1* 12/16/2014 0505   HCT 26.8* 12/16/2014 0505   PLT 210 12/16/2014 0505    BMET    Component Value Date/Time   NA 138 12/17/2014 0608   K 3.9 12/17/2014 0608   CL 104 12/17/2014 0608   CO2 20* 12/17/2014 0608   GLUCOSE 124* 12/17/2014 0608   BUN 50* 12/17/2014 0608   CREATININE 3.94* 12/17/2014 0608   CALCIUM 8.1* 12/17/2014 0608   GFRNONAA 11* 12/17/2014 0608   GFRAA 13* 12/17/2014 0608    COAG Lab Results  Component Value Date   INR 1.07 12/15/2014   INR 1.07 09/26/2012   INR 1.0 10/20/2007   No results found for: PTT  Antibiotics Anti-infectives    Start     Dose/Rate Route Frequency Ordered Stop   12/16/14 0600  ceFAZolin (ANCEF) IVPB 1 g/50 mL premix  Status:  Discontinued    Comments:  Send with pt to OR   1 g 100 mL/hr  over 30 Minutes Intravenous To ShortStay Surgical 12/15/14 1720 12/15/14 2056   12/15/14 2045  vancomycin (VANCOCIN) 1,500 mg in sodium chloride 0.9 % 500 mL IVPB  Status:  Discontinued     1,500 mg 250 mL/hr over 120 Minutes Intravenous Every 48 hours 12/15/14 0921 12/16/14 1117   12/13/14 2045  vancomycin (VANCOCIN) IVPB 1000 mg/200 mL premix  Status:  Discontinued     1,000 mg 200 mL/hr over 60 Minutes Intravenous Every 48 hours 12/13/14 2031 12/15/14 0921   12/12/14 0800  cefTRIAXone (ROCEPHIN) 1 g in dextrose 5 % 50 mL IVPB  Status:  Discontinued     1 g 100 mL/hr over 30 Minutes Intravenous Every 24 hours 12/12/14 0725 12/16/14 1117   12/11/14 1230  fluconazole (DIFLUCAN) tablet 100 mg  Status:  Discontinued     100 mg Oral Daily 12/11/14 1222 12/16/14 [place], PA-C [facility] Office: [phone] Pager: [phone] 12/17/2014 8:33 AM   Addendum  I have independently interviewed and examined the patient, and I agree with the physician assistant's findings.  Entire arm is swollen.  DVT study is inconclusive.  Given drop in H/H, suspect some limited amount bleeding in upper  arm, though I doubt it accounts for the majority of the drop.  Probably need to hold heparin for a few days.  Pt has intact motor and sensation, so I see no advantage to opening the upper arm again.   Adele Barthel, MD [facility] [place] Office: [phone] Pager: [phone]  12/17/2014, 2:22 PM

## 2014-12-17 NOTE — NC FL2 (Signed)
[place] LEVEL OF CARE SCREENING TOOL     IDENTIFICATION  Patient Name: [name] Birthdate: 1945/02/26 Sex: female Admission Date (Current Location): 12/11/2014  [place] and [place] Number: Kathleen Argue [id number] [employer] and Address:  [facility] [place], [place] [address], [place], [place] [address]      Provider Number: [id number]  Attending Physician Name and Address:  Thurnell Lose, MD  Relative Name and Phone Number:  [name] [phone]    Current Level of Care: Hospital Recommended Level of Care: [place] Prior Approval Number:    Date Approved/Denied:   PASRR Number:    Discharge Plan: SNF    Current Diagnoses: Patient Active Problem List   Diagnosis Date Noted  . Bradycardia 12/11/2014  . Acute renal failure superimposed on stage 3 chronic kidney disease ([place]) 12/11/2014  . Vomiting 12/11/2014  . Acute renal failure ([place]) 12/11/2014  . CKD (chronic kidney disease), stage IV ([place])   . Essential hypertension   . Type II diabetes mellitus ([place])   . OSA (obstructive sleep apnea) 10/17/2012  . Elevated troponin 09/26/2012  . Acute encephalopathy 09/25/2012  . CKD (chronic kidney disease) stage 3, GFR 30-59 ml/min 09/25/2012  . Tremor 09/25/2012  . Rhabdomyolysis 09/25/2012  . Morbid obesity ([place]) 09/16/2006  . DEPRESSION, CHRONIC 09/16/2006  . Seizure ([place]) 09/16/2006  . Diabetes mellitus ([place]) 09/09/2006  . Hyperlipidemia 09/09/2006  . HYPERTENSION, BENIGN 09/09/2006  . CONSTIPATION, HX OF 09/09/2006    Orientation ACTIVITIES/SOCIAL BLADDER RESPIRATION    Self, Time, Situation, Place  Active Continent Normal  BEHAVIORAL SYMPTOMS/MOOD NEUROLOGICAL BOWEL NUTRITION STATUS      Continent Diet (Heart Healthy)  PHYSICIAN VISITS COMMUNICATION OF NEEDS Height & Weight Skin    Verbally 5\' 2"  (157.5 cm) 242 lbs. Surgical wounds          AMBULATORY STATUS RESPIRATION    Assist independent  Normal      Personal Care Assistance Level of Assistance  Bathing, Dressing Bathing Assistance: Maximum assistance   Dressing Assistance: Limited assistance      Functional Limitations Info  Sight Sight Info: Adequate           SPECIAL CARE FACTORS FREQUENCY  PT (By licensed PT), OT (By licensed OT)     PT Frequency: 5x/week OT Frequency: 5x/week           Additional Factors Info  Code Status, Allergies, Insulin Sliding Scale Code Status Info: FULL CODE Allergies Info: Codeine   Insulin Sliding Scale Info: 3x/day 0-15units       Current Medications (12/17/2014): Current Facility-Administered Medications  Medication Dose Route Frequency Provider Last Rate Last Dose  . 0.9 %  sodium chloride infusion   Intravenous Once Thurnell Lose, MD      . acetaminophen (TYLENOL) tablet 650 mg  650 mg Oral Q6H PRN Melton Alar, PA-C   650 mg at 12/16/14 0804  . albuterol (PROVENTIL) (2.5 MG/3ML) 0.083% nebulizer solution 2.5 mg  2.5 mg Nebulization QHS Mauricia Area, MD      . amLODipine (NORVASC) tablet 10 mg  10 mg Oral Daily Melton Alar, PA-C   10 mg at 12/17/14 [id number]  . antiseptic oral rinse (CPC / CETYLPYRIDINIUM CHLORIDE 0.05%) solution 7 mL  7 mL Mouth Rinse BID Caren Griffins, MD   7 mL at 12/17/14 0838  . aspirin chewable tablet 81 mg  81 mg Oral Daily Rogelia Mire, NP   81 mg at 12/17/14  [id number]  . bisacodyl (DULCOLAX) EC tablet 10 mg  10 mg Oral q morning - 10a Thurnell Lose, MD   10 mg at 12/16/14 1050  . calcium acetate (PHOSLO) capsule 667 mg  667 mg Oral TID WC Mauricia Area, MD      . carbamazepine (TEGRETOL) tablet 400 mg  400 mg Oral BID Melton Alar, PA-C   400 mg at 12/17/14 0843  . citalopram (CELEXA) tablet 20 mg  20 mg Oral Daily Melton Alar, PA-C   20 mg at 12/17/14 [id number]  . clobetasol cream (TEMOVATE) [id number] % 1 application  1 application Topical BID PRN Melton Alar, PA-C      . Darbepoetin Alfa (ARANESP) injection 150 mcg  150  mcg Subcutaneous Once Mauricia Area, MD      . diphenhydrAMINE (BENADRYL) injection 25 mg  25 mg Intravenous Q6H PRN Thurnell Lose, MD      . docusate sodium (COLACE) capsule 200 mg  200 mg Oral BID Thurnell Lose, MD   200 mg at 12/16/14 2159  . ezetimibe-simvastatin (VYTORIN) 10-40 MG per tablet 1 tablet  1 tablet Oral QHS Melton Alar, PA-C   1 tablet at 12/16/14 2158  . ferumoxytol (FERAHEME) 510 mg in sodium chloride 0.9 % 100 mL IVPB  510 mg Intravenous Once Mauricia Area, MD      . furosemide (LASIX) tablet 80 mg  80 mg Oral BID Mauricia Area, MD   80 mg at 12/16/14 1709  . heparin ADULT infusion 100 units/mL (25000 units/250 mL)  800 Units/hr Intravenous Continuous Wynell Balloon, RPH 8 mL/hr at 12/17/14 0317 800 Units/hr at 12/17/14 0317  . hydrALAZINE (APRESOLINE) injection 10 mg  10 mg Intravenous Q6H PRN Thurnell Lose, MD   10 mg at 12/17/14 0004  . hydrALAZINE (APRESOLINE) tablet 100 mg  100 mg Oral 3 times per day Thurnell Lose, MD   100 mg at 12/17/14 [id number]  . insulin aspart (novoLOG) injection 0-15 Units  0-15 Units Subcutaneous TID WC Melton Alar, PA-C   2 Units at 12/17/14 [id number]  . insulin aspart (novoLOG) injection 4 Units  4 Units Subcutaneous TID WC Melton Alar, PA-C   4 Units at 12/17/14 [phone]  . insulin glargine (LANTUS) injection 15 Units  15 Units Subcutaneous QHS Melton Alar, PA-C   15 Units at 12/16/14 2305  . lacosamide (VIMPAT) tablet 100 mg  100 mg Oral BID Caren Griffins, MD   100 mg at 12/17/14 [id number]  . metoprolol (LOPRESSOR) injection 5 mg  5 mg Intravenous Q4H PRN Thurnell Lose, MD   5 mg at 12/16/14 1713  . nitroGLYCERIN (NITROGLYN) 2 % ointment 0.5 inch  0.5 inch Topical 4 times per day Thurnell Lose, MD   0.5 inch at 12/17/14 [phone]  . ondansetron (ZOFRAN) injection 4 mg  4 mg Intravenous Q6H PRN Caren Griffins, MD   4 mg at 12/16/14 2051  . oxyCODONE-acetaminophen (PERCOCET/ROXICET) 5-325 MG per tablet 1 tablet  1 tablet Oral  Q4H PRN Thurnell Lose, MD   1 tablet at 12/16/14 1324  . pantoprazole (PROTONIX) EC tablet 40 mg  40 mg Oral Daily Melton Alar, PA-C   40 mg at 12/17/14 [id number]  . senna-docusate (Senokot-S) tablet 1 tablet  1 tablet Oral QHS PRN Melton Alar, PA-C      . sodium bicarbonate tablet 1,300 mg  1,300 mg Oral BID Mauricia Area, MD  1,300 mg at 12/17/14 [id number]   Do not use this list as official medication orders. Please verify with discharge summary.  Discharge Medications:   Medication List    ASK your doctor about these medications        amLODipine 10 MG tablet  Commonly known as:  NORVASC  Take 10 mg by mouth daily.     carbamazepine 200 MG tablet  Commonly known as:  TEGRETOL  Take 400 mg by mouth 2 (two) times daily.     ciprofloxacin 500 MG tablet  Commonly known as:  CIPRO  Take 500 mg by mouth every 12 (twelve) hours. 3 day supply, not yet started     citalopram 20 MG tablet  Commonly known as:  CELEXA  Take 20 mg by mouth daily.     clobetasol cream 0.05 %  Commonly known as:  TEMOVATE  Apply 1 application topically 2 (two) times daily as needed (for legs).     cloNIDine 0.2 MG tablet  Commonly known as:  CATAPRES  Take 0.2 mg by mouth 2 (two) times daily.     ezetimibe-simvastatin 10-40 MG tablet  Commonly known as:  VYTORIN  Take 1 tablet by mouth daily.     furosemide 80 MG tablet  Commonly known as:  LASIX  Take 40 mg by mouth daily.     glipiZIDE 5 MG 24 hr tablet  Commonly known as:  GLUCOTROL XL  Take 5 mg by mouth daily with breakfast.     HUMULIN 70/30 KWIKPEN (70-30) 100 UNIT/ML PEN  Generic drug:  Insulin Isophane & Regular Human  Inject 20 Units into the skin 2 (two) times daily.     hydrALAZINE 25 MG tablet  Commonly known as:  APRESOLINE  Take 25 mg by mouth 2 (two) times daily.     metoprolol succinate 50 MG 24 hr tablet  Commonly known as:  TOPROL-XL  Take 50 mg by mouth 2 (two) times daily. Take with or immediately following a  meal.     omeprazole 40 MG capsule  Commonly known as:  PRILOSEC  Take 40 mg by mouth daily.     VIMPAT 100 MG Tabs  Generic drug:  Lacosamide  Take 1 tablet by mouth 2 (two) times daily.     VITAMIN C PO  Take 1 tablet by mouth daily.     Vitamin D-3 1000 UNITS Caps  Take 1,000 Units by mouth daily at 12 noon.        Relevant Imaging Results:  Relevant Lab Results:  Recent Labs    Additional Information SS# [id number]   [place] Intern, [id number]

## 2014-12-17 NOTE — Clinical Social Work Placement (Signed)
   CLINICAL SOCIAL WORK PLACEMENT  NOTE  Date:  12/17/2014  Patient Details  Name: [name] MRN: [id number] Date of Birth: [birth year]  Clinical Social Work is seeking post-discharge placement for this patient at the [place] level of care (*CSW will initial, date and re-position this form in  chart as items are completed):  Yes   Patient/family provided with [place] Work Department's list of facilities offering this level of care within the geographic area requested by the patient (or if unable, by the patient's family).  Yes   Patient/family informed of their freedom to choose among providers that offer the needed level of care, that participate in Medicare, Medicaid or managed care program needed by the patient, have an available bed and are willing to accept the patient.  Yes   Patient/family informed of [facility]'s ownership interest in [facility] and [facility], as well as of the fact that they are under no obligation to receive care at these facilities.  PASRR submitted to EDS on       PASRR number received on       Existing PASRR number confirmed on 12/17/14     FL2 transmitted to all facilities in geographic area requested by pt/family on 12/17/14     FL2 transmitted to all facilities within larger geographic area on       Patient informed that his/her managed care company has contracts with or will negotiate with certain facilities, including the following:            Patient/family informed of bed offers received.  Patient chooses bed at       Physician recommends and patient chooses bed at      Patient to be transferred to   on  .  Patient to be transferred to facility by       Patient family notified on   of transfer.  Name of family member notified:        PHYSICIAN Please sign FL2, Please prepare prescriptions     Additional Comment:    _______________________________________________  Tedd Sias Intern, [id number]

## 2014-12-18 ENCOUNTER — Inpatient Hospital Stay (HOSPITAL_COMMUNITY): Payer: Medicare Other

## 2014-12-18 DIAGNOSIS — N183 Chronic kidney disease, stage 3 (moderate): Secondary | ICD-10-CM

## 2014-12-18 DIAGNOSIS — N184 Chronic kidney disease, stage 4 (severe): Secondary | ICD-10-CM

## 2014-12-18 DIAGNOSIS — N179 Acute kidney failure, unspecified: Principal | ICD-10-CM

## 2014-12-18 DIAGNOSIS — N289 Disorder of kidney and ureter, unspecified: Secondary | ICD-10-CM

## 2014-12-18 LAB — GLUCOSE, CAPILLARY
GLUCOSE-CAPILLARY: 126 mg/dL — AB (ref 65–99)
GLUCOSE-CAPILLARY: 125 mg/dL — AB (ref 65–99)
Glucose-Capillary: 150 mg/dL — ABNORMAL HIGH (ref 65–99)
Glucose-Capillary: 99 mg/dL (ref 65–99)

## 2014-12-18 LAB — CBC
HEMATOCRIT: 21.1 % — AB (ref 36.0–46.0)
HEMATOCRIT: 27.7 % — AB (ref 36.0–46.0)
HEMOGLOBIN: 7.2 g/dL — AB (ref 12.0–15.0)
Hemoglobin: 9.8 g/dL — ABNORMAL LOW (ref 12.0–15.0)
MCH: 30.8 pg (ref 26.0–34.0)
MCH: 31.2 pg (ref 26.0–34.0)
MCHC: 34.1 g/dL (ref 30.0–36.0)
MCHC: 35.4 g/dL (ref 30.0–36.0)
MCV: 90.2 fL (ref 78.0–100.0)
MCV: 88.2 fL (ref 78.0–100.0)
Platelets: 159 10*3/uL (ref 150–400)
Platelets: 153 10*3/uL (ref 150–400)
RBC: 2.34 MIL/uL — AB (ref 3.87–5.11)
RBC: 3.14 MIL/uL — ABNORMAL LOW (ref 3.87–5.11)
RDW: 13.4 % (ref 11.5–15.5)
RDW: 13.9 % (ref 11.5–15.5)
WBC: 9.8 10*3/uL (ref 4.0–10.5)
WBC: 9.9 10*3/uL (ref 4.0–10.5)

## 2014-12-18 LAB — RENAL FUNCTION PANEL
ALBUMIN: 2.4 g/dL — AB (ref 3.5–5.0)
Anion gap: 10 (ref 5–15)
BUN: 46 mg/dL — AB (ref 6–20)
CO2: 23 mmol/L (ref 22–32)
Calcium: 8.1 mg/dL — ABNORMAL LOW (ref 8.9–10.3)
Chloride: 103 mmol/L (ref 101–111)
Creatinine, Ser: 3.78 mg/dL — ABNORMAL HIGH (ref 0.44–1.00)
GFR, EST AFRICAN AMERICAN: 13 mL/min — AB (ref >60)
GFR, EST NON AFRICAN AMERICAN: 11 mL/min — AB (ref >60)
Glucose, Bld: 135 mg/dL — ABNORMAL HIGH (ref 65–99)
PHOSPHORUS: 4.5 mg/dL (ref 2.5–4.6)
POTASSIUM: 3.9 mmol/L (ref 3.5–5.1)
Sodium: 136 mmol/L (ref 135–145)

## 2014-12-18 LAB — PARATHYROID HORMONE, INTACT (NO CA): PTH: 181 pg/mL — AB (ref 15–65)

## 2014-12-18 LAB — PREPARE RBC (CROSSMATCH)

## 2014-12-18 MED ORDER — FUROSEMIDE 80 MG PO TABS
160.0000 mg | ORAL_TABLET | Freq: Two times a day (BID) | ORAL | Status: DC
  Administered 2014-12-18 – 2014-12-21 (×6): 160 mg via ORAL
  Filled 2014-12-18 (×6): qty 2

## 2014-12-18 MED ORDER — SODIUM CHLORIDE 0.9 % IV SOLN
Freq: Once | INTRAVENOUS | Status: AC
  Administered 2014-12-18: 10:00:00 via INTRAVENOUS

## 2014-12-18 MED ORDER — CALCITRIOL 0.25 MCG PO CAPS
0.2500 ug | ORAL_CAPSULE | Freq: Every day | ORAL | Status: DC
  Administered 2014-12-18 – 2014-12-21 (×4): 0.25 ug via ORAL
  Filled 2014-12-18 (×4): qty 1

## 2014-12-18 NOTE — Progress Notes (Signed)
Paged [name] on call for triad hospitalists about patient's hemoglobin 7.2. Received orders to obtain occult stool sample. Will continue to monitor.

## 2014-12-18 NOTE — Progress Notes (Signed)
PT Cancellation Note  Patient Details Name: LILYKATE BANVILLE MRN: [id number] DOB: [birth year]   Cancelled Treatment:    Reason Eval/Treat Not Completed: Medical issues which prohibited therapy.  Pt Hgb 7.2 and c/o feeling lightheaded.  Will hold PT at this time and f/u another time.     Kristi Norment, Thornton Papas 12/18/2014, 9:42 AM

## 2014-12-18 NOTE — Progress Notes (Signed)
Preliminary results by tech - Renal Duplex completed. Technically challenged study due to patient's body habitus. High resistance waveforms were noted throughout the right and left renal arteries with no evidence of a significant stenosis noted. The right proximal renal artery was not clearly visualized, can not exclude stenosis at this level. Abnormal resistance indices were noted also in both kidneys which may be suggestive of parenchymal disease.  Oda Cogan, BS, RDMS, RVT

## 2014-12-18 NOTE — Clinical Social Work Note (Signed)
Bed offers given to patient.  Liz Beach MSW, [place], [place], [id number]

## 2014-12-18 NOTE — Care Management Note (Addendum)
Case Management Note Marvetta Gibbons RN, BSN Unit 2W-Case Manager [phone] Covering 3W  Patient Details  Name: [name] MRN: [id number] Date of Birth: 05-29-1945  Subjective/Objective:  Pt admitted with bradycardia, CHF, ARF                  Action/Plan: PTA pt lived at home, per conversation with pt she has PCS aide that comes 2hr/day- she also has RW and tub bench at home- discussed [facility] services- and offered list for [facility] agencies of [facility]- pt states that she does not have a preference for agency- and is ok with using [facility] for [facility] services- referral made to Butch Penny with [facility] for HH-RN/PT/SW- (pt does not need aide as she already has aide through [facility])- orders have already been placed in epic.   Expected Discharge Date:                  Expected Discharge Plan:  [place]  In-House Referral:     Discharge planning Services  CM Consult  Post Acute Care Choice:  Home Health Choice offered to:  Patient  DME Arranged:  N/A DME Agency:  NA  HH Arranged:  RN, PT, Nurse's Aide, Social Work [employer] Agency:  [facility]  Status of Service:  Completed, signed off  Medicare Important Message Given:  Yes Date Medicare IM Given:    Medicare IM give by:    Date Additional Medicare IM Given:    Additional Medicare Important Message give by:     If discussed at [place] of Stay Meetings, dates discussed:    Additional Comments: CM assessed pt.  CSW is actively pursuing  SNF placement and pt is in agreement. CM will continue to monitor for disposition needs Maryclare Labrador, RN 12/18/2014, 10:12 AM Case Management Note

## 2014-12-18 NOTE — Progress Notes (Signed)
Subjective: Interval History: has no complaint arm better.  Objective: Vital signs in last 24 hours: Temp:  [97.7 F (36.5 C)-99.3 F (37.4 C)] 99.3 F (37.4 C) (11/23 1115) Pulse Rate:  [82-88] 88 (11/23 1115) Resp:  [15-21] 17 (11/23 0700) BP: (154-189)/(46-71) 169/46 mmHg (11/23 1115) SpO2:  [94 %-100 %] 98 % (11/23 1115) Weight:  [110.995 kg (244 lb 11.2 oz)] 110.995 kg (244 lb 11.2 oz) (11/23 0454) Weight change: 0.953 kg (2 lb 1.6 oz)  Intake/Output from previous day: 11/22 0701 - 11/23 0700 In: 2010 [P.O.:1680; Blood:330] Out: 1700 [Urine:1700] Intake/Output this shift: Total I/O In: 30 [Blood:30] Out: 500 [Urine:500]  General appearance: alert, cooperative and morbidly obese Resp: diminished breath sounds bilaterally Cardio: S1, S2 normal and systolic murmur: holosystolic 2/6, blowing at apex GI: obese,pos bs Extremities: edema 1+., pos radial on L  Lab Results:  Recent Labs  12/17/14 1211 12/17/14 2021 12/18/14 0328  WBC 10.8*  --  9.8  HGB 7.1* 8.4* 7.2*  HCT 20.7* 24.5* 21.1*  PLT 171  --  159   BMET:  Recent Labs  12/17/14 0608 12/18/14 0328  NA 138 136  K 3.9 3.9  CL 104 103  CO2 20* 23  GLUCOSE 124* 135*  BUN 50* 46*  CREATININE 3.94* 3.78*  CALCIUM 8.1* 8.1*    Recent Labs  12/16/14 2126  PTH 181*   Iron Studies:  Recent Labs  12/17/14 1211  IRON 275*  TIBC 179*  FERRITIN 326*    Studies/Results: No results found.  I have reviewed the patient's current medications.  Assessment/Plan: 1 CKD 4 stable, needs to get vol down a little more,^ lasix.  RA study neg.  Suspect at new level, nonrevering injury form low perfusion with bradycardia 2 Anemia on esa ,Fe ok getting blood 3 HPTH Vit D add 4 Obesity 5 Bradycardia 6 DM controlled 7 bps not well controlled P ^ lasix,follow chem, bps    LOS: 7 days   Tanuj Mullens L 12/18/2014,11:29 AM

## 2014-12-18 NOTE — Progress Notes (Signed)
[facility]                                                                              Patient Demographics  [name], is a 69 y.o. female, DOB - 1946-01-01, [id number]  Admit date - 12/11/2014   Admitting Physician [name], MD  Outpatient Primary MD for the patient is [name],[name], MD  LOS - 7   Chief Complaint  Patient presents with  . Emesis  . Bradycardia       Brief HPI   This is a pleasant 69 year old African-American female with past medical history of chronic kidney disease stage IV, diabetes mellitus, essential hypertension, morbid obesity, chronic diastolic heart failure who was admitted to the hospital initially due to nausea vomiting along with mild acute renal failure also was by constipation. This was stabilized and she was supposed to be discharged however on the day of anticipated discharge 2 days ago she developed sudden onset of left arm pain early in the morning around 5:30 AM.  Further workup showed that she had loss of blood flow, vascular surgery was emergently consulted, she was started on heparin drip and taken to the OR for left brachial artery embolectomy. She is currently on heparin drip with close vascular surgery follow-up, she still has weakness in the left arm, she has now developed some swelling in the left arm with drop in hemoglobin and hematocrit. Patient was transfused 1unit of packed RBC on 12/17/2014, vascular surgery following closely.   Assessment & Plan    1.nausea and vomiting- resolved, most likely due to underlying constipation -  KUB shows stool burden but no obstruction, place on bowel regimen and monitor. Currently nausea free. As needed IV Zofran. Initiated bowel regimen for constipation.  - Tolerating diet   2. Bradycardia upon admission. Likely Medication related, beta blocker and clonidine has been held, heart rate better. - TSH stable.  - 2-D echo showed EF of 55-60%, normal wall motion  -  Cardiology was consulted, recommended no further workup.   3. ARF and CK D stage IV. Baseline creatinine close to 2.2, likely due to nausea vomiting dehydration, creatinine peaked at 4. -  Continue gentle hydration, transfuse 1 more unit of packed RBCs. Nephrology following - Renal ultrasound on 11/17 showed no acute abnormality, no hydronephrosis or obstruction  4. UTI. Treated with fluconazole and Rocephin. Stable Renal US.  5. Essential hypertension. Currently on Norvasc and hydralazine continued.  6. Chronic diastolic dysfunction with EF 55-60%. Clinically compensated monitor of diuretic. Mild troponin elevation not an ACS pattern, EKG nonacute, on aspirin, cannot use beta blocker due to bradycardia, no further workup per cardiology who has seen the patient. Chest pain-free.  7. 1 out of 2 blood cultures positive for gram-positive cocci, confirmed coag negative staph which is contamination. Antibiotics discontinued  8. Left arm weakness/numbness started 5:30 AM 12/15/2014. -  On exam, appeared cold with weak radial pulse, stat arterial duplex obtained with evidence of loss of blood flow, heparin drip started, continue aspirin, vascular surgery was following. Patient underwent emergent left brachial embolectomy on 12/15/2014 by vascular surgery. No left arm DVT - Per  vascular surgery, suspect some limited amount bleeding in the upper arm, may need to hold heparin for a few days. Patient has intact motor sensation so no adventitious opening the upper arm again - Once hemoglobin has stabilized, will need anticoagulation for at least 3 months per vascular surgery for presumed thrombotic embolism to left brachial artery, follow-up with Dr. Scot Dock in 2 weeks.   9. Anemia. - Follow stool occult test, transfuse 1 more unit of packed RBC  Code Status: Full code  Family Communication: Discussed in detail with the patient, all imaging results, lab results explained to the patient   Disposition  Plan:   Time Spent in minutes  25 minutes  Procedures  TTE  Left ventricle: The cavity size was normal. Wall thickness wasincreased in a pattern of moderate LVH. Systolic function wasnormal. The estimated ejection fraction was in the range of 55%to 60%. Wall motion was normal; there were no regional wallmotion abnormalities. - Aortic valve: Valve area (VTI): 1.97 cm^2. Valve area (Vmax): 1.77 cm^2. Valve area (Vmean): 2.09 cm^2. - Left atrium: The atrium was mildly dilated.  Vascular Ultrasound Upper Extremity Arterial Duplex has been completed. Preliminary findings: Left = No arterial flow visualized throughout left upper extremity. The left subclavian, axillary, brachial, radial, and ulnar arteries were evaluated.  Right subclavian artery was compared and demonstrates biphasic flow.  Left arm brachial embolectomy on 12/15/2014 by Dr. Scot Dock.  Renal US - Stable  Consults   Nephrology, vascular surgery  DVT Prophylaxis  heparin   Medications  Scheduled Meds: . sodium chloride   Intravenous Once  . albuterol  2.5 mg Nebulization QHS  . amLODipine  10 mg Oral Daily  . antiseptic oral rinse  7 mL Mouth Rinse BID  . aspirin  81 mg Oral Daily  . bisacodyl  10 mg Oral q morning - 10a  . calcitRIOL  0.25 mcg Oral Daily  . calcium acetate  667 mg Oral TID WC  . carbamazepine  400 mg Oral BID  . citalopram  20 mg Oral Daily  . docusate sodium  200 mg Oral BID  . ezetimibe-simvastatin  1 tablet Oral QHS  . furosemide  160 mg Oral BID  . heparin subcutaneous  5,000 Units Subcutaneous 3 times per day  . hydrALAZINE  100 mg Oral 3 times per day  . insulin aspart  0-15 Units Subcutaneous TID WC  . insulin aspart  4 Units Subcutaneous TID WC  . insulin glargine  15 Units Subcutaneous QHS  . lacosamide  100 mg Oral BID  . nitroGLYCERIN  0.5 inch Topical 4 times per day  . pantoprazole  40 mg Oral Daily  . sodium bicarbonate  1,300 mg Oral BID   Continuous Infusions:  PRN  Meds:.acetaminophen **OR** [DISCONTINUED] acetaminophen, clobetasol cream, diphenhydrAMINE, hydrALAZINE, metoprolol, ondansetron (ZOFRAN) IV, oxyCODONE-acetaminophen, senna-docusate   Antibiotics   Anti-infectives    Start     Dose/Rate Route Frequency Ordered Stop   12/16/14 0600  ceFAZolin (ANCEF) IVPB 1 g/50 mL premix  Status:  Discontinued    Comments:  Send with pt to OR   1 g 100 mL/hr over 30 Minutes Intravenous To [facility] 12/15/14 1720 12/15/14 2056   12/15/14 2045  vancomycin (VANCOCIN) 1,500 mg in sodium chloride 0.9 % 500 mL IVPB  Status:  Discontinued     1,500 mg 250 mL/hr over 120 Minutes Intravenous Every 48 hours 12/15/14 0921 12/16/14 1117   12/13/14 2045  vancomycin (VANCOCIN) IVPB 1000 mg/200 mL premix  Status:  Discontinued     1,000 mg 200 mL/hr over 60 Minutes Intravenous Every 48 hours 12/13/14 2031 12/15/14 0921   12/12/14 0800  cefTRIAXone (ROCEPHIN) 1 g in dextrose 5 % 50 mL IVPB  Status:  Discontinued     1 g 100 mL/hr over 30 Minutes Intravenous Every 24 hours 12/12/14 0725 12/16/14 1117   12/11/14 1230  fluconazole (DIFLUCAN) tablet 100 mg  Status:  Discontinued     100 mg Oral Daily 12/11/14 1222 12/16/14 1117        Subjective:   [place] was seen and examined today. Patient denies dizziness, chest pain, shortness of breath, abdominal pain, N/V/D/C. tolerating diet, left arm pain improving  Objective:   Blood pressure 169/46, pulse 88, temperature 99.3 F (37.4 C), temperature source Oral, resp. rate 17, height 5\' 2"  (1.575 m), weight 110.995 kg (244 lb 11.2 oz), SpO2 98 %.  Wt Readings from Last 3 Encounters:  12/18/14 110.995 kg (244 lb 11.2 oz)  11/22/14 109.68 kg (241 lb 12.8 oz)  07/24/14 113.445 kg (250 lb 1.6 oz)     Intake/Output Summary (Last 24 hours) at 12/18/14 1158 Last data filed at 12/18/14 1100  Gross per 24 hour  Intake   1920 ml  Output   1750 ml  Net    170 ml    Exam  General: Alert and oriented x  3, NAD  HEENT:  PERRLA, EOMI, Anicteric Sclera, mucous membranes moist.   Neck: Supple, no JVD, no masses  CVS: S1 S2 auscultated, no rubs, murmurs or gallops. Regular rate and rhythm.  Respiratory: Clear to auscultation bilaterally, no wheezing, rales or rhonchi  Abdomen: Soft, nontender, nondistended, + bowel sounds  Ext: no cyanosis clubbing or edema, left arm warm, radial pulse+  Neuro: no new deficits  Skin: No rashes  Psych: Normal affect and demeanor, alert and oriented x3    Data Review   Micro Results Recent Results (from the past 240 hour(s))  Urine culture     Status: None   Collection Time: 12/11/14  8:58 AM  Result Value Ref Range Status   Specimen Description URINE, RANDOM  Final   Special Requests NONE  Final   Culture MULTIPLE SPECIES PRESENT, SUGGEST RECOLLECTION  Final   Report Status 12/13/2014 FINAL  Final  Culture, blood (routine x 2)     Status: None   Collection Time: 12/12/14  4:50 PM  Result Value Ref Range Status   Specimen Description BLOOD RIGHT ARM  Final   Special Requests IN PEDIATRIC BOTTLE 1CC  Final   Culture  Setup Time   Final    GRAM POSITIVE COCCI IN CLUSTERS AEROBIC BOTTLE ONLY CRITICAL RESULT CALLED TO, READ BACK BY AND VERIFIED WITH: Dolores Hoose RN 2014 12/13/14 A BROWNING    Culture   Final    STAPHYLOCOCCUS SPECIES (COAGULASE NEGATIVE) THE SIGNIFICANCE OF ISOLATING THIS ORGANISM FROM A SINGLE SET OF BLOOD CULTURES WHEN MULTIPLE SETS ARE DRAWN IS UNCERTAIN. PLEASE NOTIFY THE MICROBIOLOGY DEPARTMENT WITHIN ONE WEEK IF SPECIATION AND SENSITIVITIES ARE REQUIRED.    Report Status 12/16/2014 FINAL  Final  Culture, blood (routine x 2)     Status: None   Collection Time: 12/12/14  4:59 PM  Result Value Ref Range Status   Specimen Description BLOOD RIGHT ANTECUBITAL  Final   Special Requests BOTTLES DRAWN AEROBIC AND ANAEROBIC 5CC  Final   Culture NO GROWTH 5 DAYS  Final   Report Status 12/17/2014 FINAL  Final  Surgical pcr  screen     Status: None   Collection Time: 12/15/14  9:06 AM  Result Value Ref Range Status   MRSA, PCR NEGATIVE NEGATIVE Final   Staphylococcus aureus NEGATIVE NEGATIVE Final    Comment:        The Xpert SA Assay (FDA approved for NASAL specimens in patients over 80 years of age), is one component of a comprehensive surveillance program.  Test performance has been validated by [facility] for patients greater than or equal to 46 year old. It is not intended to diagnose infection nor to guide or monitor treatment.     Radiology Reports Dg Chest 2 View  12/14/2014  CLINICAL DATA:  Patient with shortness of breath. EXAM: CHEST  2 VIEW COMPARISON:  Chest radiograph 12/13/2014 FINDINGS: Monitoring leads overlie the patient. Stable enlarged cardiac and mediastinal contours. Elevation of the right hemidiaphragm. No consolidative pulmonary opacities. No pleural effusion or pneumothorax. Regional skeleton is unremarkable. IMPRESSION: No acute cardiopulmonary process. Cardiomegaly. Electronically Signed   By: Lovey Newcomer M.D.   On: 12/14/2014 14:43   Dg Chest 2 View  12/11/2014  CLINICAL DATA:  Bradycardia and vomiting since yesterday. EXAM: CHEST  2 VIEW COMPARISON:  None. FINDINGS: Enlarged cardiac silhouette similar to prior. Exam is lordotic which exaggerates the cardiac silhouette. Interval increase in central venous congestion. Low lung volumes. No pleural fluid. IMPRESSION: Cardiomegaly and increased central venous congestion. Electronically Signed   By: Suzy Bouchard M.D.   On: 12/11/2014 09:31   US Renal  12/12/2014  CLINICAL DATA:  Acute renal failure EXAM: RENAL / URINARY TRACT ULTRASOUND COMPLETE COMPARISON:  None. FINDINGS: Right Kidney: Length: 9.7 cm. Echogenicity within normal limits. No mass or hydronephrosis visualized. Left Kidney: Length: 9.2 cm. Echogenicity within normal limits. No mass or hydronephrosis visualized. Bladder: Decompressed following voiding IMPRESSION: No  acute abnormality noted. Electronically Signed   By: Inez Catalina M.D.   On: 12/12/2014 16:56   Dg Chest Port 1 View  12/13/2014  CLINICAL DATA:  Shortness of breath. EXAM: PORTABLE CHEST 1 VIEW COMPARISON:  December 12, 2014 FINDINGS: Stable mild cardiomegaly is noted with mild central pulmonary vascular congestion. No pneumothorax or pleural effusion is noted. Both lungs are clear. Osteophyte formation is noted in mid thoracic spine. IMPRESSION: Stable mild cardiomegaly with central pulmonary vascular congestion. No significant change compared to prior exam. Electronically Signed   By: Marijo Conception, M.D.   On: 12/13/2014 07:55   Dg Chest Port 1 View  12/12/2014  CLINICAL DATA:  Shortness of breath for 2 days EXAM: PORTABLE CHEST 1 VIEW COMPARISON:  12/11/2014 FINDINGS: Cardiomegaly again noted. Mild thoracic dextroscoliosis. Degenerative changes thoracic spine. Central mild vascular congestion without pulmonary edema. No focal infiltrate IMPRESSION: Cardiomegaly. Mild thoracic dextroscoliosis. Central vascular congestion without convincing pulmonary edema. No segmental infiltrate. Electronically Signed   By: Lahoma Crocker M.D.   On: 12/12/2014 08:00   Dg Abd Portable 1v  12/12/2014  CLINICAL DATA:  Nausea EXAM: PORTABLE ABDOMEN - 1 VIEW COMPARISON:  CT abdomen 08/06/2009 FINDINGS: Retained stool in the colon. Negative for bowel obstruction. No renal calculi. Lumbar degenerative change at L4-5 and L5-S1. No acute skeletal abnormality IMPRESSION: Constipation without bowel obstruction. Electronically Signed   By: Franchot Gallo M.D.   On: 12/12/2014 10:28    CBC  Recent Labs Lab 12/15/14 0800 12/16/14 0505 12/17/14 0608 12/17/14 1211 12/17/14 2021 12/18/14 0328  WBC 8.7 9.9 11.1* 10.8*  --  9.8  HGB 9.4*  9.1* 6.9* 7.1* 8.4* 7.2*  HCT 27.8* 26.8* 20.0* 20.7* 24.5* 21.1*  PLT 212 210 177 171  --  159  MCV 89.4 89.9 90.1 91.2  --  90.2  MCH 30.2 30.5 31.1 31.3  --  30.8  MCHC 33.8 34.0  34.5 34.3  --  34.1  RDW 12.5 12.9 13.1 13.5  --  13.4    Chemistries   Recent Labs Lab 12/13/14 0612 12/15/14 0800 12/16/14 0444 12/16/14 1620 12/17/14 0608 12/18/14 0328  NA 137 134* 136  --  138 136  K 4.3 4.3 6.0* 4.2 3.9 3.9  CL 103 99* 105  --  104 103  CO2 25 21* 17*  --  20* 23  GLUCOSE 92 273* 167*  --  124* 135*  BUN 53* 39* 45*  --  50* 46*  CREATININE 3.35* 2.74* 3.24*  --  3.94* 3.78*  CALCIUM 8.3* 9.0 8.5*  --  8.1* 8.1*  MG  --   --  2.2  --  1.9  --   AST  --   --   --   --  180*  --   ALT  --   --   --   --  34  --   ALKPHOS  --   --   --   --  64  --   BILITOT  --   --   --   --  0.8  --    ------------------------------------------------------------------------------------------------------------------ estimated creatinine clearance is 17 mL/min (by C-G formula based on Cr of 3.78). ------------------------------------------------------------------------------------------------------------------ No results for input(s): HGBA1C in the last 72 hours. ------------------------------------------------------------------------------------------------------------------ No results for input(s): CHOL, HDL, LDLCALC, TRIG, CHOLHDL, LDLDIRECT in the last 72 hours. ------------------------------------------------------------------------------------------------------------------ No results for input(s): TSH, T4TOTAL, T3FREE, THYROIDAB in the last 72 hours.  Invalid input(s): FREET3 ------------------------------------------------------------------------------------------------------------------  Recent Labs  12/16/14 1620 12/17/14 1211  VITAMINB12  --  427  FOLATE  --  8.7  FERRITIN  --  326*  TIBC 190* 179*  IRON 36 275*  RETICCTPCT  --  4.5*    Coagulation profile  Recent Labs Lab 12/15/14 0800  INR 1.07    No results for input(s): DDIMER in the last 72 hours.  Cardiac Enzymes  Recent Labs Lab 12/11/14 1305 12/11/14 1905 12/12/14 0042    TROPONINI 0.49* 0.57* 0.49*   ------------------------------------------------------------------------------------------------------------------ Invalid input(s): POCBNP   Recent Labs  12/17/14 0734 12/17/14 1157 12/17/14 1627 12/17/14 2107 12/18/14 0730 12/18/14 1133  GLUCAP 124* 157* 109* 159* 126* 150*     Nabila Albarracin M.D. [facility] 12/18/2014, 11:58 AM  Pager: [phone] Between 7am to 7pm - call Pager - [phone]  After 7pm go to [email or web address] - password [email or web address]  Call night coverage person covering after 7pm

## 2014-12-18 NOTE — Plan of Care (Addendum)
Problem: Tissue Perfusion: Goal: Risk factors for ineffective tissue perfusion will decrease Outcome: Progressing S/p brachial embolectomy

## 2014-12-18 NOTE — Progress Notes (Signed)
OT Cancellation Note  Patient Details Name: [name] MRN: [id number] DOB: 1945/12/27   Cancelled Treatment:    Reason Eval/Treat Not Completed:  Pt reports feeling lightheaded and also would like to finish eating her breakfast.  Benito Mccreedy OTR/L [id number] 12/18/2014, 9:21 AM

## 2014-12-18 NOTE — Progress Notes (Signed)
UR Completed. Kato Wieczorek, RN, BSN.  [phone] 

## 2014-12-19 DIAGNOSIS — R001 Bradycardia, unspecified: Secondary | ICD-10-CM | POA: Insufficient documentation

## 2014-12-19 DIAGNOSIS — E785 Hyperlipidemia, unspecified: Secondary | ICD-10-CM

## 2014-12-19 DIAGNOSIS — I742 Embolism and thrombosis of arteries of the upper extremities: Secondary | ICD-10-CM | POA: Insufficient documentation

## 2014-12-19 LAB — CBC
HCT: 25.4 % — ABNORMAL LOW (ref 36.0–46.0)
Hemoglobin: 8.7 g/dL — ABNORMAL LOW (ref 12.0–15.0)
MCH: 30.4 pg (ref 26.0–34.0)
MCHC: 34.3 g/dL (ref 30.0–36.0)
MCV: 88.8 fL (ref 78.0–100.0)
PLATELETS: 173 10*3/uL (ref 150–400)
RBC: 2.86 MIL/uL — AB (ref 3.87–5.11)
RDW: 13.8 % (ref 11.5–15.5)
WBC: 8.3 10*3/uL (ref 4.0–10.5)

## 2014-12-19 LAB — GLUCOSE, CAPILLARY
GLUCOSE-CAPILLARY: 83 mg/dL (ref 65–99)
GLUCOSE-CAPILLARY: 133 mg/dL — AB (ref 65–99)
Glucose-Capillary: 121 mg/dL — ABNORMAL HIGH (ref 65–99)
Glucose-Capillary: 103 mg/dL — ABNORMAL HIGH (ref 65–99)

## 2014-12-19 LAB — BASIC METABOLIC PANEL
ANION GAP: 10 (ref 5–15)
BUN: 44 mg/dL — ABNORMAL HIGH (ref 6–20)
CALCIUM: 8.3 mg/dL — AB (ref 8.9–10.3)
CO2: 25 mmol/L (ref 22–32)
Chloride: 99 mmol/L — ABNORMAL LOW (ref 101–111)
Creatinine, Ser: 3.53 mg/dL — ABNORMAL HIGH (ref 0.44–1.00)
GFR calc Af Amer: 14 mL/min — ABNORMAL LOW (ref >60)
GFR, EST NON AFRICAN AMERICAN: 12 mL/min — AB (ref >60)
GLUCOSE: 87 mg/dL (ref 65–99)
POTASSIUM: 3.8 mmol/L (ref 3.5–5.1)
SODIUM: 134 mmol/L — AB (ref 135–145)

## 2014-12-19 LAB — HEPARIN LEVEL (UNFRACTIONATED): Heparin Unfractionated: 0.41 IU/mL (ref 0.30–0.70)

## 2014-12-19 MED ORDER — DARBEPOETIN ALFA 150 MCG/0.3ML IJ SOSY: 150.0000 ug | PREFILLED_SYRINGE | INTRAMUSCULAR | Status: DC

## 2014-12-19 MED ORDER — HEPARIN (PORCINE) IN NACL 100-0.45 UNIT/ML-% IJ SOLN
800.0000 [IU]/h | INTRAMUSCULAR | Status: DC
  Administered 2014-12-19 – 2014-12-20 (×2): 800 [IU]/h via INTRAVENOUS
  Filled 2014-12-19: qty 250

## 2014-12-19 MED ORDER — ALBUTEROL SULFATE (2.5 MG/3ML) 0.083% IN NEBU: 2.5000 mg | INHALATION_SOLUTION | Freq: Four times a day (QID) | RESPIRATORY_TRACT | Status: DC | PRN

## 2014-12-19 NOTE — Progress Notes (Signed)
Subjective: Interval History: has no complaint, feeling better.  Objective: Vital signs in last 24 hours: Temp:  [98 F (36.7 C)-99.3 F (37.4 C)] 98 F (36.7 C) (11/24 0400) Pulse Rate:  [84-92] 92 (11/24 0400) Resp:  [14-18] 15 (11/24 0400) BP: (147-197)/(41-74) 161/66 mmHg (11/24 0617) SpO2:  [94 %-98 %] 97 % (11/24 0400) Weight:  [110.2 kg (242 lb 15.2 oz)] 110.2 kg (242 lb 15.2 oz) (11/24 0455) Weight change: -0.795 kg (-1 lb 12.1 oz)  Intake/Output from previous day: 11/23 0701 - 11/24 0700 In: 1025 [P.O.:660; Blood:365] Out: 2550 [Urine:2550] Intake/Output this shift: Total I/O In: 340 [P.O.:340] Out: 600 [Urine:600]  General appearance: alert, cooperative and morbidly obese Neck: PCL Resp: diminished breath sounds bilaterally Cardio: S1, S2 normal and systolic murmur: holosystolic 2/6, blowing at apex GI: obese,pos bs, soft Extremities: edema 1+,  has radial pulse L  Lab Results:  Recent Labs  12/18/14 1831 12/19/14 0359  WBC 9.9 8.3  HGB 9.8* 8.7*  HCT 27.7* 25.4*  PLT 153 173   BMET:  Recent Labs  12/18/14 0328 12/19/14 0359  NA 136 134*  K 3.9 3.8  CL 103 99*  CO2 23 25  GLUCOSE 135* 87  BUN 46* 44*  CREATININE 3.78* 3.53*  CALCIUM 8.1* 8.3*    Recent Labs  12/16/14 2126  PTH 181*   Iron Studies:  Recent Labs  12/17/14 1211  IRON 275*  TIBC 179*  FERRITIN 326*    Studies/Results: No results found.  I have reviewed the patient's current medications.  Assessment/Plan: 1 CKD baseline 2.5-3.  AKI with bradycardia. Now doing some better. 2 anemia on esa 3 HPTH vit D 4 Embolus to L arm  ?? Source 5 DM controlled 6 HTN no chronotropic agents, cont current P lasix, amlod, hydral, esa, anticoag    LOS: 8 days   Kalia Vahey L 12/19/2014,9:57 AM

## 2014-12-19 NOTE — Progress Notes (Signed)
Patient Name: [name] Date of Encounter: 12/19/2014  Active Problems:   Diabetes mellitus ([place])   Hyperlipidemia   Morbid obesity ([place])   HYPERTENSION, BENIGN   Seizure ([place])   CKD (chronic kidney disease) stage 3, GFR 30-59 ml/min   Elevated troponin   OSA (obstructive sleep apnea)   Bradycardia   Acute renal failure superimposed on stage 3 chronic kidney disease (HCC)   Vomiting   CKD (chronic kidney disease), stage IV ([place])   Essential hypertension   Type II diabetes mellitus ([place])   Acute renal failure [facility])   Primary Cardiologist: Dr Angelena Form (saw 11/16)  Patient Profile: 69 yo female w/ hx DM, HTN, HL, CKD IV, D-CHF, morbid obesity, Sz, GERD, admitted 11/16 w/ bradycardia and hypotension. BB on hold. Cards signed off 11/17, but pt had L brachial thrombus, ?cardiac source and cards asked to address.  SUBJECTIVE: Pt denies chest pain or SOB. L arm still painful to move and does not move very well, but is improved after surgery.  OBJECTIVE Filed Vitals:   12/19/14 0350 12/19/14 0400 12/19/14 0455 12/19/14 0617  BP: 175/58 147/66  161/66  Pulse:  92    Temp:  98 F (36.7 C)    TempSrc:  Oral    Resp: 14 15    Height:      Weight:   242 lb 15.2 oz (110.2 kg)   SpO2:  97%      Intake/Output Summary (Last 24 hours) at 12/19/14 1357 Last data filed at 12/19/14 1338  Gross per 24 hour  Intake   1000 ml  Output   3550 ml  Net  -2550 ml   Filed Weights   12/17/14 0630 12/18/14 0454 12/19/14 0455  Weight: 242 lb 9.6 oz (110.043 kg) 244 lb 11.2 oz (110.995 kg) 242 lb 15.2 oz (110.2 kg)    PHYSICAL EXAM General: Well developed, well nourished, female in no acute distress. Head: Normocephalic, atraumatic.  Neck: Supple without bruits, JVD not elevated but difficult to assess 2nd body habitus. Lungs:  Resp regular and unlabored, few rales, but good air exchange. Heart: RRR, S1, S2, no S3, S4, 2/6 murmur; no rub. Abdomen: Soft, non-tender, non-distended,  BS + x 4.  Extremities: No clubbing, cyanosis, trace edema. Can move L arm but it is painful, 2+ radial pulses Neuro: Alert and oriented X 3. Moves all extremities spontaneously. Psych: Normal affect.  LABS: CBC: Recent Labs  12/18/14 1831 12/19/14 0359  WBC 9.9 8.3  HGB 9.8* 8.7*  HCT 27.7* 25.4*  MCV 88.2 88.8  PLT 153 [id number]   Basic Metabolic Panel: Recent Labs  12/17/14 0608 12/18/14 0328 12/19/14 0359  NA 138 136 134*  K 3.9 3.9 3.8  CL 104 103 99*  CO2 20* 23 25  GLUCOSE 124* 135* 87  BUN 50* 46* 44*  CREATININE 3.94* 3.78* 3.53*  CALCIUM 8.1* 8.1* 8.3*  MG 1.9  --   --   PHOS 5.6* 4.5  --    Liver Function Tests: Recent Labs  12/17/14 0608 12/18/14 0328  AST 180*  --   ALT 34  --   ALKPHOS 64  --   BILITOT 0.8  --   PROT 5.5*  --   ALBUMIN 2.4* 2.4*   Cardiac Enzymes: Recent Labs  12/17/14 1211  CKTOTAL 29562*   BNP:  B NATRIURETIC PEPTIDE  Date/Time Value Ref Range Status  12/13/2014 06:12 AM 1254.1* 0.0 - 100.0 pg/mL Final  12/12/2014 08:38  AM 2226.5* 0.0 - 100.0 pg/mL Final   Anemia Panel: Recent Labs  12/17/14 1211  VITAMINB12 427  FOLATE 8.7  FERRITIN 326*  TIBC 179*  IRON 275*  RETICCTPCT 4.5*   TELE: SR, no sig ectopy since initial bradycardia resolved  Current Medications:  . albuterol  2.5 mg Nebulization QHS  . amLODipine  10 mg Oral Daily  . antiseptic oral rinse  7 mL Mouth Rinse BID  . aspirin  81 mg Oral Daily  . bisacodyl  10 mg Oral q morning - 10a  . calcitRIOL  0.25 mcg Oral Daily  . calcium acetate  667 mg Oral TID WC  . carbamazepine  400 mg Oral BID  . citalopram  20 mg Oral Daily  . [START ON 12/24/2014] darbepoetin (ARANESP) injection - NON-DIALYSIS  150 mcg Subcutaneous Q Tue-1800  . docusate sodium  200 mg Oral BID  . ezetimibe-simvastatin  1 tablet Oral QHS  . furosemide  160 mg Oral BID  . hydrALAZINE  100 mg Oral 3 times per day  . insulin aspart  0-15 Units Subcutaneous TID WC  . insulin aspart  4  Units Subcutaneous TID WC  . insulin glargine  15 Units Subcutaneous QHS  . lacosamide  100 mg Oral BID  . nitroGLYCERIN  0.5 inch Topical 4 times per day  . pantoprazole  40 mg Oral Daily  . sodium bicarbonate  1,300 mg Oral BID   . heparin 800 Units/hr (12/19/14 1224)    ASSESSMENT AND PLAN: 1. Sinus bradycardia/Junctional Bradycardia: Pt presented on admission with a 1 day h/o nausea and vomiting and has been found to be bradycardic, initially jxnl followed by sinus brady. She was on Toprol XL 50 daily and clonidine 0.2 bid. Now off both, HR improved. TSH nl. Echo w/ nl EF  2. Essential HTN: BP elevated despite multiple meds including hydralazine, amlodipine, and lasix.Off BB and clonidine. Mgt per IM, also seen by nephrology.   3. Acute on chronic renal failure, stage IV-V: In setting of marked hypertension. Cr above baseline on admission, Renal is managing with IM.   4. Elevated troponin: Trop 0.28 initially in setting of AKI. Other troponins mildly elevated.No c/p.ECG is w/o acute st/t changes. No ischemic eval. Body habitus limits nuclear imaging, CKD prevents cath. On ASA and statin. No BB w/ bradycardia. No ACE/ARB w/ poor renal function. EF nl w/ no WMA on echo.   5. DM II: Per IM.  6.Acute arterial occlusion L subclavian: sudden onset LUE cold, painful, numb 11/20. S/p L brachial embolectomy w/ vein patch angioplasty (cephalic vein). Moderate amount fresh clot removed, suspected cardiac embolus.   However, telemetry reviewed and no significant bradycardia or arrhythmia since initial bradycardia resolved. Echo performed just after admission showed normal EF and no WMA. Aortic valve has a peak gradient of only 15 mm Hg, so do not think valvular problem contributes. MD advise on repeating echo or if any other evaluation is needed.  Otherwise, per IM Active Problems:   Diabetes mellitus ([place])   Hyperlipidemia   Morbid obesity (HCC)   HYPERTENSION, BENIGN    Seizure (HCC)   CKD (chronic kidney disease) stage 3, GFR 30-59 ml/min   Elevated troponin   OSA (obstructive sleep apnea)   Bradycardia   Acute renal failure superimposed on stage 3 chronic kidney disease (HCC)   Vomiting   CKD (chronic kidney disease), stage IV (HCC)   Essential hypertension   Type II diabetes mellitus ([place])   Acute renal  failure ([place])   Signed, Barrett, Suanne Marker , PA-C 1:57 PM 12/19/2014 The patient was seen and examined, and I agree with the assessment and plan as documented above, with modifications as noted below. No clear intracardiac source of embolism, and no documented atrial fibrillation throughout hospitalization. LVEF normal on 11/16. Embolism may have been due to endothelial dysfunction, similar in pathology to intracoronary thrombus. I do not recommend a loop or event recorder at this time. Would anticoagulate as recommended by vascular surgery.   Kate Sable, MD, [facility]  12/19/2014 4:24 PM

## 2014-12-19 NOTE — Progress Notes (Addendum)
ANTICOAGULATION CONSULT NOTE - Follow Up Consult  Pharmacy Consult for Heparin Indication: left arm arterial thrombus  Allergies  Allergen Reactions  . Codeine Nausea And Vomiting    Patient Measurements: Height: 5\' 2"  (157.5 cm) Weight: 242 lb 15.2 oz (110.2 kg) IBW/kg (Calculated) : 50.1 Heparin Dosing Weight: 78 kg  Vital Signs: Temp: 98 F (36.7 C) (11/24 0400) Temp Source: Oral (11/24 0400) BP: 161/66 mmHg (11/24 0617) Pulse Rate: 92 (11/24 0400)  Labs:  Recent Labs  12/16/14 2126  12/17/14 0608 12/17/14 1211  12/18/14 0328 12/18/14 1831 12/19/14 0359  HGB  --   < > 6.9* 7.1*  < > 7.2* 9.8* 8.7*  HCT  --   < > 20.0* 20.7*  < > 21.1* 27.7* 25.4*  PLT  --   < > 177 171  --  159 153 173  HEPARINUNFRC 0.98*  --  0.62 0.54  --   --   --   --   CREATININE  --   --  3.94*  --   --  3.78*  --  3.53*  CKTOTAL  --   --   --  14037*  --   --   --   --   < > = values in this interval not displayed.  Estimated Creatinine Clearance: 18.1 mL/min (by C-G formula based on Cr of 3.53).  Assessment: 69yo female now POD #4 left brachial embolectomy with patch angioplasty.  Pharmacy consulted to manage IV heparin for left arm arterial thrombus.  IV heparin had been held 11/22 and 11/23 d/t drop in CBC.  Hgb now improved 6.9>>8.7, received 1 unit PRBC 11/22 and an additional unitPRBC 11/23.  Aranesp 117mcg sq and Feraheme 510mg  IV given 11/22.  Left arm swelling and tenderness evaluated by VVS. Duplex scan done. Mild oozing at incision site this am, but resolved quickly and no further oozing per patient.  Planning 3 months anticoagulation.     Goal of Therapy:  Heparin level 0.3-0.7 units/ml Monitor platelets by anticoagulation protocol: Yes   Plan:   Resume heparin drip at previously therapeutic rate - 800 units/hr (no bolus)  Daily heparin level, CBC, s/sx of bleeding  Will follow up [place], PharmD, CPP Clinical Pharmacist Pager:  [phone] 12/19/2014 11:45 AM

## 2014-12-19 NOTE — Progress Notes (Addendum)
[facility]                                                                              Patient Demographics  [name], is a 69 y.o. female, DOB - [birth year], [id number]  Admit date - 12/11/2014   Admitting Physician [name], MD  Outpatient Primary MD for the patient is [name],[name], MD  LOS - 8   Chief Complaint  Patient presents with  . Emesis  . Bradycardia       Brief HPI   This is a pleasant 68 year old African-American female with past medical history of chronic kidney disease stage IV, diabetes mellitus, essential hypertension, morbid obesity, chronic diastolic heart failure who was admitted to the hospital initially due to nausea vomiting along with mild acute renal failure also was by constipation. This was stabilized and she was supposed to be discharged however on the day of anticipated discharge 2 days ago she developed sudden onset of left arm pain early in the morning around 5:30 AM.  Further workup showed that she had loss of blood flow, vascular surgery was emergently consulted, she was started on heparin drip and taken to the OR for left brachial artery embolectomy. She is currently on heparin drip with close vascular surgery follow-up, she still has weakness in the left arm, she has now developed some swelling in the left arm with drop in hemoglobin and hematocrit. Patient was transfused 1unit of packed RBC on 12/17/2014, vascular surgery following closely.   Assessment & Plan    Nausea and vomiting- resolved, most likely due to underlying constipation -  KUB shows stool burden but no obstruction, place on bowel regimen and monitor. Currently nausea free. As needed IV Zofran. Initiated bowel regimen for constipation.  - Tolerating diet   Bradycardia upon admission. Likely Medication related, beta blocker and clonidine has been held, heart rate better.  - TSH stable.  - 2-D echo showed EF of 55-60%, normal wall motion  -  Cardiology was consulted, recommended no further workup.   ARF and CK D stage IV. Baseline creatinine close to 2.2, likely due to nausea vomiting dehydration, creatinine peaked at 4. -  Continue gentle hydration, transfuse 1 more unit of packed RBCs. Nephrology following - Renal ultrasound on 11/17 showed no acute abnormality, no hydronephrosis or obstruction   Left arm weakness/numbness started 5:30 AM 12/15/2014. -  On exam, appeared cold with weak radial pulse, stat arterial duplex obtained with evidence of loss of blood flow, heparin drip started, continue aspirin, vascular surgery was following. Patient underwent emergent left brachial embolectomy on 12/15/2014 by vascular surgery. No left arm DVT. - Per vascular surgery, suspect some limited amount bleeding in the upper arm, may need to hold heparin for a few days. Patient has intact motor sensation so no advantage to opening the upper arm again - Once hemoglobin has stabilized, will need anticoagulation for at least 3 months per vascular surgery for presumed thromboembolism to left brachial artery, follow-up with Dr. Scot Dock in 2 weeks.  - Discussed with Dr. Trula Slade, okay to start heparin drip without bolus today, follow closely for any bleeding, H&H. Also recommended  to have cardiology see her again, ? Holter monitor or loop recorder to assess if patient has any underlying A. Fib, source of clot - d/w Dr Bronson Ing for cards consult  UTI  - Treated with fluconazole and Rocephin. Stable Renal US.   Essential hypertension. Currently on Norvasc and hydralazine continued.  Chronic diastolic dysfunction with EF 55-60%. Clinically compensated monitor of diuretic. Mild troponin elevation not an ACS pattern, EKG nonacute, on aspirin, cannot use beta blocker due to bradycardia, no further workup per cardiology who has seen the patient. Chest pain-free.  1/2 GPC bacteremia:  confirmed coag negative staph which is contamination. Antibiotics  discontinued   Anemia. -Currently stable, starting heparin drip today, follow H&H closely  Code Status: Full code  Family Communication: Discussed in detail with the patient, all imaging results, lab results explained to the patient   Disposition Plan:   Time Spent in minutes  25 minutes  Procedures  TTE  Left ventricle: The cavity size was normal. Wall thickness wasincreased in a pattern of moderate LVH. Systolic function wasnormal. The estimated ejection fraction was in the range of 55%to 60%. Wall motion was normal; there were no regional wallmotion abnormalities. - Aortic valve: Valve area (VTI): 1.97 cm^2. Valve area (Vmax): 1.77 cm^2. Valve area (Vmean): 2.09 cm^2. - Left atrium: The atrium was mildly dilated.  Vascular Ultrasound Upper Extremity Arterial Duplex has been completed. Preliminary findings: Left = No arterial flow visualized throughout left upper extremity. The left subclavian, axillary, brachial, radial, and ulnar arteries were evaluated.  Right subclavian artery was compared and demonstrates biphasic flow.  Left arm brachial embolectomy on 12/15/2014 by Dr. Scot Dock.  Renal US - Stable  Consults   Nephrology, vascular surgery  DVT Prophylaxis  heparin   Medications  Scheduled Meds: . albuterol  2.5 mg Nebulization QHS  . amLODipine  10 mg Oral Daily  . antiseptic oral rinse  7 mL Mouth Rinse BID  . aspirin  81 mg Oral Daily  . bisacodyl  10 mg Oral q morning - 10a  . calcitRIOL  0.25 mcg Oral Daily  . calcium acetate  667 mg Oral TID WC  . carbamazepine  400 mg Oral BID  . citalopram  20 mg Oral Daily  . [START ON 12/24/2014] darbepoetin (ARANESP) injection - NON-DIALYSIS  150 mcg Subcutaneous Q Tue-1800  . docusate sodium  200 mg Oral BID  . ezetimibe-simvastatin  1 tablet Oral QHS  . furosemide  160 mg Oral BID  . heparin subcutaneous  5,000 Units Subcutaneous 3 times per day  . hydrALAZINE  100 mg Oral 3 times per day  . insulin  aspart  0-15 Units Subcutaneous TID WC  . insulin aspart  4 Units Subcutaneous TID WC  . insulin glargine  15 Units Subcutaneous QHS  . lacosamide  100 mg Oral BID  . nitroGLYCERIN  0.5 inch Topical 4 times per day  . pantoprazole  40 mg Oral Daily  . sodium bicarbonate  1,300 mg Oral BID   Continuous Infusions:  PRN Meds:.acetaminophen **OR** [DISCONTINUED] acetaminophen, clobetasol cream, diphenhydrAMINE, hydrALAZINE, metoprolol, ondansetron (ZOFRAN) IV, oxyCODONE-acetaminophen, senna-docusate   Antibiotics   Anti-infectives    Start     Dose/Rate Route Frequency Ordered Stop   12/16/14 0600  ceFAZolin (ANCEF) IVPB 1 g/50 mL premix  Status:  Discontinued    Comments:  Send with pt to OR   1 g 100 mL/hr over 30 Minutes Intravenous To ShortStay Surgical 12/15/14 1720 12/15/14 2056   12/15/14 2045  vancomycin (VANCOCIN) 1,500 mg in sodium chloride 0.9 % 500 mL IVPB  Status:  Discontinued     1,500 mg 250 mL/hr over 120 Minutes Intravenous Every 48 hours 12/15/14 0921 12/16/14 1117   12/13/14 2045  vancomycin (VANCOCIN) IVPB 1000 mg/200 mL premix  Status:  Discontinued     1,000 mg 200 mL/hr over 60 Minutes Intravenous Every 48 hours 12/13/14 2031 12/15/14 0921   12/12/14 0800  cefTRIAXone (ROCEPHIN) 1 g in dextrose 5 % 50 mL IVPB  Status:  Discontinued     1 g 100 mL/hr over 30 Minutes Intravenous Every 24 hours 12/12/14 0725 12/16/14 1117   12/11/14 1230  fluconazole (DIFLUCAN) tablet 100 mg  Status:  Discontinued     100 mg Oral Daily 12/11/14 1222 12/16/14 1117        Subjective:   [name] was seen and examined today. Patient denies dizziness, chest pain, shortness of breath, abdominal pain, N/V/D/C. tolerating diet, left arm pain improving  Objective:   Blood pressure 161/66, pulse 92, temperature 98 F (36.7 C), temperature source Oral, resp. rate 15, height 5\' 2"  (1.575 m), weight 110.2 kg (242 lb 15.2 oz), SpO2 97 %.  Wt Readings from Last 3 Encounters:    12/19/14 110.2 kg (242 lb 15.2 oz)  11/22/14 109.68 kg (241 lb 12.8 oz)  07/24/14 113.445 kg (250 lb 1.6 oz)     Intake/Output Summary (Last 24 hours) at 12/19/14 1131 Last data filed at 12/19/14 1124  Gross per 24 hour  Intake   1335 ml  Output   3150 ml  Net  -1815 ml    Exam  General: Alert and oriented x 3, NAD  HEENT:  PERRLA, EOMI, Anicteric Sclera, mucous membranes moist.   Neck: Supple, no JVD, no masses  CVS: S1 S2clear, RRR  Respiratory: CTAB  Abdomen: Soft, nontender, nondistended, + bowel sounds  Ext: no cyanosis clubbing or edema, left arm warm, radial pulse+  Neuro: no new deficits  Skin: No rashes  Psych: Normal affect and demeanor, alert and oriented x3    Data Review   Micro Results Recent Results (from the past 240 hour(s))  Urine culture     Status: None   Collection Time: 12/11/14  8:58 AM  Result Value Ref Range Status   Specimen Description URINE, RANDOM  Final   Special Requests NONE  Final   Culture MULTIPLE SPECIES PRESENT, SUGGEST RECOLLECTION  Final   Report Status 12/13/2014 FINAL  Final  Culture, blood (routine x 2)     Status: None   Collection Time: 12/12/14  4:50 PM  Result Value Ref Range Status   Specimen Description BLOOD RIGHT ARM  Final   Special Requests IN PEDIATRIC BOTTLE 1CC  Final   Culture  Setup Time   Final    GRAM POSITIVE COCCI IN CLUSTERS AEROBIC BOTTLE ONLY CRITICAL RESULT CALLED TO, READ BACK BY AND VERIFIED WITH: Dolores Hoose RN 2014 12/13/14 A BROWNING    Culture   Final    STAPHYLOCOCCUS SPECIES (COAGULASE NEGATIVE) THE SIGNIFICANCE OF ISOLATING THIS ORGANISM FROM A SINGLE SET OF BLOOD CULTURES WHEN MULTIPLE SETS ARE DRAWN IS UNCERTAIN. PLEASE NOTIFY THE MICROBIOLOGY DEPARTMENT WITHIN ONE WEEK IF SPECIATION AND SENSITIVITIES ARE REQUIRED.    Report Status 12/16/2014 FINAL  Final  Culture, blood (routine x 2)     Status: None   Collection Time: 12/12/14  4:59 PM  Result Value Ref Range Status    Specimen Description BLOOD RIGHT ANTECUBITAL  Final   Special Requests BOTTLES DRAWN AEROBIC AND ANAEROBIC 5CC  Final   Culture NO GROWTH 5 DAYS  Final   Report Status 12/17/2014 FINAL  Final  Surgical pcr screen     Status: None   Collection Time: 12/15/14  9:06 AM  Result Value Ref Range Status   MRSA, PCR NEGATIVE NEGATIVE Final   Staphylococcus aureus NEGATIVE NEGATIVE Final    Comment:        The Xpert SA Assay (FDA approved for NASAL specimens in patients over 22 years of age), is one component of a comprehensive surveillance program.  Test performance has been validated by [facility] for patients greater than or equal to 63 year old. It is not intended to diagnose infection nor to guide or monitor treatment.     Radiology Reports Dg Chest 2 View  12/14/2014  CLINICAL DATA:  Patient with shortness of breath. EXAM: CHEST  2 VIEW COMPARISON:  Chest radiograph 12/13/2014 FINDINGS: Monitoring leads overlie the patient. Stable enlarged cardiac and mediastinal contours. Elevation of the right hemidiaphragm. No consolidative pulmonary opacities. No pleural effusion or pneumothorax. Regional skeleton is unremarkable. IMPRESSION: No acute cardiopulmonary process. Cardiomegaly. Electronically Signed   By: Lovey Newcomer M.D.   On: 12/14/2014 14:43   Dg Chest 2 View  12/11/2014  CLINICAL DATA:  Bradycardia and vomiting since yesterday. EXAM: CHEST  2 VIEW COMPARISON:  None. FINDINGS: Enlarged cardiac silhouette similar to prior. Exam is lordotic which exaggerates the cardiac silhouette. Interval increase in central venous congestion. Low lung volumes. No pleural fluid. IMPRESSION: Cardiomegaly and increased central venous congestion. Electronically Signed   By: Suzy Bouchard M.D.   On: 12/11/2014 09:31   US Renal  12/12/2014  CLINICAL DATA:  Acute renal failure EXAM: RENAL / URINARY TRACT ULTRASOUND COMPLETE COMPARISON:  None. FINDINGS: Right Kidney: Length: 9.7 cm. Echogenicity within  normal limits. No mass or hydronephrosis visualized. Left Kidney: Length: 9.2 cm. Echogenicity within normal limits. No mass or hydronephrosis visualized. Bladder: Decompressed following voiding IMPRESSION: No acute abnormality noted. Electronically Signed   By: Inez Catalina M.D.   On: 12/12/2014 16:56   Dg Chest Port 1 View  12/13/2014  CLINICAL DATA:  Shortness of breath. EXAM: PORTABLE CHEST 1 VIEW COMPARISON:  December 12, 2014 FINDINGS: Stable mild cardiomegaly is noted with mild central pulmonary vascular congestion. No pneumothorax or pleural effusion is noted. Both lungs are clear. Osteophyte formation is noted in mid thoracic spine. IMPRESSION: Stable mild cardiomegaly with central pulmonary vascular congestion. No significant change compared to prior exam. Electronically Signed   By: Marijo Conception, M.D.   On: 12/13/2014 07:55   Dg Chest Port 1 View  12/12/2014  CLINICAL DATA:  Shortness of breath for 2 days EXAM: PORTABLE CHEST 1 VIEW COMPARISON:  12/11/2014 FINDINGS: Cardiomegaly again noted. Mild thoracic dextroscoliosis. Degenerative changes thoracic spine. Central mild vascular congestion without pulmonary edema. No focal infiltrate IMPRESSION: Cardiomegaly. Mild thoracic dextroscoliosis. Central vascular congestion without convincing pulmonary edema. No segmental infiltrate. Electronically Signed   By: Lahoma Crocker M.D.   On: 12/12/2014 08:00   Dg Abd Portable 1v  12/12/2014  CLINICAL DATA:  Nausea EXAM: PORTABLE ABDOMEN - 1 VIEW COMPARISON:  CT abdomen 08/06/2009 FINDINGS: Retained stool in the colon. Negative for bowel obstruction. No renal calculi. Lumbar degenerative change at L4-5 and L5-S1. No acute skeletal abnormality IMPRESSION: Constipation without bowel obstruction. Electronically Signed   By: Franchot Gallo M.D.   On: 12/12/2014 10:28  CBC  Recent Labs Lab 12/17/14 0608 12/17/14 1211 12/17/14 2021 12/18/14 0328 12/18/14 1831 12/19/14 0359  WBC 11.1* 10.8*  --   9.8 9.9 8.3  HGB 6.9* 7.1* 8.4* 7.2* 9.8* 8.7*  HCT 20.0* 20.7* 24.5* 21.1* 27.7* 25.4*  PLT 177 171  --  159 153 173  MCV 90.1 91.2  --  90.2 88.2 88.8  MCH 31.1 31.3  --  30.8 31.2 30.4  MCHC 34.5 34.3  --  34.1 35.4 34.3  RDW 13.1 13.5  --  13.4 13.9 13.8    Chemistries   Recent Labs Lab 12/15/14 0800 12/16/14 0444 12/16/14 1620 12/17/14 0608 12/18/14 0328 12/19/14 0359  NA 134* 136  --  138 136 134*  K 4.3 6.0* 4.2 3.9 3.9 3.8  CL 99* 105  --  104 103 99*  CO2 21* 17*  --  20* 23 25  GLUCOSE 273* 167*  --  124* 135* 87  BUN 39* 45*  --  50* 46* 44*  CREATININE 2.74* 3.24*  --  3.94* 3.78* 3.53*  CALCIUM 9.0 8.5*  --  8.1* 8.1* 8.3*  MG  --  2.2  --  1.9  --   --   AST  --   --   --  180*  --   --   ALT  --   --   --  34  --   --   ALKPHOS  --   --   --  64  --   --   BILITOT  --   --   --  0.8  --   --    ------------------------------------------------------------------------------------------------------------------ estimated creatinine clearance is 18.1 mL/min (by C-G formula based on Cr of 3.53). ------------------------------------------------------------------------------------------------------------------ No results for input(s): HGBA1C in the last 72 hours. ------------------------------------------------------------------------------------------------------------------ No results for input(s): CHOL, HDL, LDLCALC, TRIG, CHOLHDL, LDLDIRECT in the last 72 hours. ------------------------------------------------------------------------------------------------------------------ No results for input(s): TSH, T4TOTAL, T3FREE, THYROIDAB in the last 72 hours.  Invalid input(s): FREET3 ------------------------------------------------------------------------------------------------------------------  Recent Labs  12/16/14 1620 12/17/14 1211  VITAMINB12  --  427  FOLATE  --  8.7  FERRITIN  --  326*  TIBC 190* 179*  IRON 36 275*  RETICCTPCT  --  4.5*     Coagulation profile  Recent Labs Lab 12/15/14 0800  INR 1.07    No results for input(s): DDIMER in the last 72 hours.  Cardiac Enzymes No results for input(s): CKMB, TROPONINI, MYOGLOBIN in the last 168 hours.  Invalid input(s): CK ------------------------------------------------------------------------------------------------------------------ Invalid input(s): POCBNP   Recent Labs  12/17/14 2107 12/18/14 0730 12/18/14 1133 12/18/14 1657 12/18/14 2053 12/19/14 0730  GLUCAP 159* 126* 150* 19* 99 77     Sonda Coppens M.D. [facility] 12/19/2014, 11:31 AM  Pager: [phone] Between 7am to 7pm - call Pager - [phone]  After 7pm go to [email or web address] - password [email or web address]  Call night coverage person covering after 7pm

## 2014-12-19 NOTE — Progress Notes (Signed)
Pt arm with scant bleeding at suture site. Will continue to monitor. Etta Quill, RN

## 2014-12-19 NOTE — Progress Notes (Signed)
    Subjective  - POD #4  C/o arm swelling Pain controlled   Physical Exam:  Palpable left radial pulse Incision intact       Assessment/Plan:  POD #4  Ok to d/c when anticoagulated F/u Dr. Bridgett Larsson 2 weeks  Annamarie Major 12/19/2014 7:41 AM --  Danley Danker Vitals:   12/19/14 0400 12/19/14 0617  BP: 147/66 161/66  Pulse: 92   Temp: 98 F (36.7 C)   Resp: 15     Intake/Output Summary (Last 24 hours) at 12/19/14 0741 Last data filed at 12/19/14 0430  Gross per 24 hour  Intake   1025 ml  Output   2550 ml  Net  -1525 ml     Laboratory CBC    Component Value Date/Time   WBC 8.3 12/19/2014 0359   HGB 8.7* 12/19/2014 0359   HCT 25.4* 12/19/2014 0359   PLT 173 12/19/2014 0359    BMET    Component Value Date/Time   NA 134* 12/19/2014 0359   K 3.8 12/19/2014 0359   CL 99* 12/19/2014 0359   CO2 25 12/19/2014 0359   GLUCOSE 87 12/19/2014 0359   BUN 44* 12/19/2014 0359   CREATININE 3.53* 12/19/2014 0359   CALCIUM 8.3* 12/19/2014 0359   GFRNONAA 12* 12/19/2014 0359   GFRAA 14* 12/19/2014 0359    COAG Lab Results  Component Value Date   INR 1.07 12/15/2014   INR 1.07 09/26/2012   INR 1.0 10/20/2007   No results found for: PTT  Antibiotics Anti-infectives    Start     Dose/Rate Route Frequency Ordered Stop   12/16/14 0600  ceFAZolin (ANCEF) IVPB 1 g/50 mL premix  Status:  Discontinued    Comments:  Send with pt to OR   1 g 100 mL/hr over 30 Minutes Intravenous To ShortStay Surgical 12/15/14 1720 12/15/14 2056   12/15/14 2045  vancomycin (VANCOCIN) 1,500 mg in sodium chloride 0.9 % 500 mL IVPB  Status:  Discontinued     1,500 mg 250 mL/hr over 120 Minutes Intravenous Every 48 hours 12/15/14 0921 12/16/14 1117   12/13/14 2045  vancomycin (VANCOCIN) IVPB 1000 mg/200 mL premix  Status:  Discontinued     1,000 mg 200 mL/hr over 60 Minutes Intravenous Every 48 hours 12/13/14 2031 12/15/14 0921   12/12/14 0800  cefTRIAXone (ROCEPHIN) 1 g in dextrose 5 % 50  mL IVPB  Status:  Discontinued     1 g 100 mL/hr over 30 Minutes Intravenous Every 24 hours 12/12/14 0725 12/16/14 1117   12/11/14 1230  fluconazole (DIFLUCAN) tablet 100 mg  Status:  Discontinued     100 mg Oral Daily 12/11/14 1222 12/16/14 1117       V. Leia Alf, M.D. [facility] Office: [phone] Pager:  [phone]

## 2014-12-19 NOTE — Progress Notes (Signed)
Report received via Olla, in patient's room ,using SBAR format, reviewed chart, orders, labs, VS, meds and patient's general condition, assumed care of patient.

## 2014-12-19 NOTE — Progress Notes (Signed)
ANTICOAGULATION CONSULT NOTE - Follow Up Consult  Pharmacy Consult for Heparin Indication: left arm arterial thrombus  Allergies  Allergen Reactions  . Codeine Nausea And Vomiting    Patient Measurements: Height: 5\' 2"  (157.5 cm) Weight: 242 lb 15.2 oz (110.2 kg) IBW/kg (Calculated) : 50.1 Heparin Dosing Weight: 78 kg  Vital Signs: Temp: 97.8 F (36.6 C) (11/24 2012) Temp Source: Oral (11/24 2012) BP: 163/54 mmHg (11/24 1805) Pulse Rate: 73 (11/24 2012)  Labs:  Recent Labs  12/17/14 [id number] 12/17/14 1211  12/18/14 0328 12/18/14 1831 12/19/14 0359 12/19/14 2030  HGB 6.9* 7.1*  < > 7.2* 9.8* 8.7*  --   HCT 20.0* 20.7*  < > 21.1* 27.7* 25.4*  --   PLT 177 171  --  159 153 173  --   HEPARINUNFRC 0.62 0.54  --   --   --   --  0.41  CREATININE 3.94*  --   --  3.78*  --  3.53*  --   CKTOTAL  --  14037*  --   --   --   --   --   < > = values in this interval not displayed.  Estimated Creatinine Clearance: 18.1 mL/min (by C-G formula based on Cr of 3.53).  Assessment: 69yo female now POD #4 left brachial embolectomy with patch angioplasty.  Pharmacy consulted to manage IV heparin for left arm arterial thrombus.  IV heparin had been held 11/22 and 11/23 d/t drop in CBC.  Hgb now improved 6.9>>8.7, received 1 unit PRBC 11/22 and an additional unitPRBC 11/23.  Aranesp 128mcg sq and Feraheme 510mg  IV given 11/22.  Left arm swelling and tenderness evaluated by VVS. Duplex scan done. Mild oozing at incision site this am, but resolved quickly and no further oozing per patient.  Planning 3 months anticoagulation.     Heparin level = 0.41 on rate of 800 units./hr.   Goal of Therapy:  Heparin level 0.3-0.7 units/ml Monitor platelets by anticoagulation protocol: Yes   Plan:   Continue heparin drip at 800 units/hr   Daily heparin level, CBC, s/sx of bleeding  Will follow up [place], PharmD., MS Clinical Pharmacist Pager:  [phone] Thank you for  allowing pharmacy to be part of this patients care team. 12/19/2014 9:13 PM

## 2014-12-20 DIAGNOSIS — I742 Embolism and thrombosis of arteries of the upper extremities: Secondary | ICD-10-CM

## 2014-12-20 LAB — BASIC METABOLIC PANEL
ANION GAP: 10 (ref 5–15)
BUN: 44 mg/dL — ABNORMAL HIGH (ref 6–20)
CALCIUM: 8.4 mg/dL — AB (ref 8.9–10.3)
CO2: 26 mmol/L (ref 22–32)
CREATININE: 3.23 mg/dL — AB (ref 0.44–1.00)
Chloride: 96 mmol/L — ABNORMAL LOW (ref 101–111)
GFR, EST AFRICAN AMERICAN: 16 mL/min — AB (ref >60)
GFR, EST NON AFRICAN AMERICAN: 14 mL/min — AB (ref >60)
Glucose, Bld: 80 mg/dL (ref 65–99)
Potassium: 3.6 mmol/L (ref 3.5–5.1)
SODIUM: 132 mmol/L — AB (ref 135–145)

## 2014-12-20 LAB — CBC
HEMATOCRIT: 26.3 % — AB (ref 36.0–46.0)
Hemoglobin: 8.9 g/dL — ABNORMAL LOW (ref 12.0–15.0)
MCH: 30.4 pg (ref 26.0–34.0)
MCHC: 33.8 g/dL (ref 30.0–36.0)
MCV: 89.8 fL (ref 78.0–100.0)
PLATELETS: 206 10*3/uL (ref 150–400)
RBC: 2.93 MIL/uL — ABNORMAL LOW (ref 3.87–5.11)
RDW: 13.7 % (ref 11.5–15.5)
WBC: 7.9 10*3/uL (ref 4.0–10.5)

## 2014-12-20 LAB — GLUCOSE, CAPILLARY
GLUCOSE-CAPILLARY: 89 mg/dL (ref 65–99)
GLUCOSE-CAPILLARY: 108 mg/dL — AB (ref 65–99)
Glucose-Capillary: 80 mg/dL (ref 65–99)
Glucose-Capillary: 133 mg/dL — ABNORMAL HIGH (ref 65–99)

## 2014-12-20 LAB — HEPARIN LEVEL (UNFRACTIONATED)
HEPARIN UNFRACTIONATED: 0.4 [IU]/mL (ref 0.30–0.70)
Heparin Unfractionated: 0.27 IU/mL — ABNORMAL LOW (ref 0.30–0.70)

## 2014-12-20 MED ORDER — WARFARIN SODIUM 7.5 MG PO TABS
7.5000 mg | ORAL_TABLET | Freq: Once | ORAL | Status: AC
  Administered 2014-12-20: 7.5 mg via ORAL
  Filled 2014-12-20: qty 1

## 2014-12-20 MED ORDER — WARFARIN VIDEO
Freq: Once | Status: AC
  Administered 2014-12-20: 18:00:00

## 2014-12-20 MED ORDER — COUMADIN BOOK
Freq: Once | Status: AC
  Administered 2014-12-20: 18:00:00
  Filled 2014-12-20: qty 1

## 2014-12-20 MED ORDER — WARFARIN - PHARMACIST DOSING INPATIENT: Freq: Every day | Status: DC

## 2014-12-20 NOTE — Progress Notes (Signed)
Subjective: Interval History: has complaints wants to go home.  Objective: Vital signs in last 24 hours: Temp:  [97.8 F (36.6 C)-98.9 F (37.2 C)] 98.3 F (36.8 C) (11/25 1133) Pulse Rate:  [73-90] 73 (11/24 2012) Resp:  [13-22] 16 (11/25 0351) BP: (133-183)/(50-68) 133/50 mmHg (11/25 0633) SpO2:  [93 %-98 %] 93 % (11/25 0351) Weight:  [108.319 kg (238 lb 12.8 oz)] 108.319 kg (238 lb 12.8 oz) (11/25 0343) Weight change: -1.881 kg (-4 lb 2.4 oz)  Intake/Output from previous day: 11/24 0701 - 11/25 0700 In: 1328.8 [P.O.:1180; I.V.:148.8] Out: [id number] [Urine:3550] Intake/Output this shift: Total I/O In: 512 [P.O.:480; I.V.:32] Out: 550 [Urine:550]  General appearance: alert, cooperative, no distress and morbidly obese Resp: diminished breath sounds bilaterally Cardio: S1, S2 normal and systolic murmur: systolic ejection 2/6, crescendo at 2nd left intercostal space GI: obese, pos bs, liverdown  6cm Extremities: edema 2+ and palp pulse L radial  Lab Results:  Recent Labs  12/19/14 0359 12/20/14 0258  WBC 8.3 7.9  HGB 8.7* 8.9*  HCT 25.4* 26.3*  PLT 173 206   BMET:  Recent Labs  12/19/14 0359 12/20/14 0258  NA 134* 132*  K 3.8 3.6  CL 99* 96*  CO2 25 26  GLUCOSE 87 80  BUN 44* 44*  CREATININE 3.53* 3.23*  CALCIUM 8.3* 8.4*   No results for input(s): PTH in the last 72 hours. Iron Studies:  Recent Labs  12/17/14 1211  IRON 275*  TIBC 179*  FERRITIN 326*    Studies/Results: No results found.  I have reviewed the patient's current medications.  Assessment/Plan: 1 CKD 4 with AKI from cardiac source.  Approaching baseline but will not get a lot lower.  Vol better and bp improving with diuresis 2 HTN improving 3 anemia esa ,Fe ok 4 HPTH vit D 5 obesity 6 Embolus to hand anticoag P cont Lasix, antiHTN meds, Vit D, esa, f/u with Dr. Florene Glen after d/c  Will s/o for now    LOS: 9 days   Omar Gayden L 12/20/2014,11:37 AM

## 2014-12-20 NOTE — Progress Notes (Signed)
[name] for heparin + warfarin Indication: left arm arterial thrombus  Allergies  Allergen Reactions  . Codeine Nausea And Vomiting    Patient Measurements: Height: 5\' 2"  (157.5 cm) Weight: 238 lb 12.8 oz (108.319 kg) IBW/kg (Calculated) : 50.1 Heparin Dosing Weight: 78 kg  Vital Signs: Temp: 98.3 F (36.8 C) (11/25 1133) Temp Source: Oral (11/25 1133) BP: 133/50 mmHg (11/25 0633)  Labs:  Recent Labs  12/17/14 1211  12/18/14 0328 12/18/14 1831 12/19/14 0359 12/19/14 2030 12/20/14 0258 12/20/14 1025  HGB 7.1*  < > 7.2* 9.8* 8.7*  --  8.9*  --   HCT 20.7*  < > 21.1* 27.7* 25.4*  --  26.3*  --   PLT 171  --  159 153 173  --  206  --   HEPARINUNFRC 0.54  --   --   --   --  0.41 0.27* 0.40  CREATININE  --   --  3.78*  --  3.53*  --  3.23*  --   CKTOTAL 13086*  --   --   --   --   --   --   --   < > = values in this interval not displayed.  Estimated Creatinine Clearance: 19.6 mL/min (by C-G formula based on Cr of 3.23).  Assessment: 69 yo female now POD #5 left brachial embolectomy with patch angioplasty.  Pharmacy consulted to manage IV heparin for left arm arterial thrombus.  IV heparin had been held 11/22 and 11/23 d/t drop in CBC but has now been resumed.  Heparin level this morning is below goal at 0.27 on 800 units/hr- per RN, patient lost her IV site early this morning and heparin had been off and level was drawn shortly thereafter. A repeat level is therapeutic at 0.4units/mL- second therapeutic level on 800 units/hr.  To start warfarin tonight. Plan per Dr. Josem Kaufmann note is for 3 months of anticoagulation. Today is D#1/5 of overlap with heparin + warfarin.  Goal of Therapy:  INR 2-3 Heparin level 0.3-0.7 units/ml Monitor platelets by anticoagulation protocol: Yes   Plan:  - Continue heparin drip at 800 units/hr - warfarin 7.5mg  po x1 tonight  - Daily INR, heparin level and CBC - Monitor for s/sx of bleeding -  warfarin book and video ordered. Verbal education will be provided  Francoise Chojnowski D. Tanajah Boulter, PharmD, BCPS Clinical Pharmacist Pager: [phone] 12/20/2014 11:40 AM

## 2014-12-20 NOTE — Clinical Social Work Note (Signed)
Clinical Social Worker continuing to follow patient and family for support and discharge planning needs.  Per CM, patient plans to go to [facility] with Lovenox when medically stable.  CSW attempted to visit with patient to confirm facility choice, however patient sleeping with no family at bedside.  CSW to confirm bed choice and facilitate patient discharge needs once medically stable.  [name], [name]

## 2014-12-20 NOTE — Progress Notes (Signed)
[facility]                                                                              Patient Demographics  [name], is a 69 y.o. female, DOB - 1945-12-03, [id number]  Admit date - 12/11/2014   Admitting Physician [name], MD  Outpatient Primary MD for the patient is [name],[name], MD  LOS - 9   Chief Complaint  Patient presents with  . Emesis  . Bradycardia       Brief HPI   This is a pleasant 69 year old African-American female with past medical history of chronic kidney disease stage IV, diabetes mellitus, essential hypertension, morbid obesity, chronic diastolic heart failure who was admitted to the hospital initially due to nausea vomiting along with mild acute renal failure also was by constipation. This was stabilized and she was supposed to be discharged however on the day of anticipated discharge 2 days ago she developed sudden onset of left arm pain early in the morning around 5:30 AM.  Further workup showed that she had loss of blood flow, vascular surgery was emergently consulted, she was started on heparin drip and taken to the OR for left brachial artery embolectomy. She is currently on heparin drip with close vascular surgery follow-up, she still has weakness in the left arm, she has now developed some swelling in the left arm with drop in hemoglobin and hematocrit. Patient was transfused 1unit of packed RBC on 12/17/2014, vascular surgery following closely.   Assessment & Plan    Nausea and vomiting- resolved, most likely due to underlying constipation -  KUB shows stool burden but no obstruction, place on bowel regimen and monitor. Currently nausea free. As needed IV Zofran. Initiated bowel regimen for constipation.  - Tolerating diet   Bradycardia upon admission. Likely Medication related, beta blocker and clonidine has been held, heart rate better.  - TSH stable.  - 2-D echo showed EF of 55-60%, normal wall motion  -  Cardiology was consulted, recommended no further workup.   ARF and CK D stage IV. Baseline creatinine close to 2.2, likely due to nausea vomiting dehydration, creatinine peaked at 4. -  Continue gentle hydration, transfuse 1 more unit of packed RBCs. Nephrology following - Renal ultrasound on 11/17 showed no acute abnormality, no hydronephrosis or obstruction   Left arm weakness/numbness started 5:30 AM 12/15/2014, brachial thrombus. -  On exam, appeared cold with weak radial pulse, stat arterial duplex obtained with evidence of loss of blood flow, heparin drip started, continue aspirin, vascular surgery was following. Patient underwent emergent left brachial embolectomy on 12/15/2014 by vascular surgery. No left arm DVT. - Per vascular surgery, suspect some limited amount bleeding in the upper arm, may need to hold heparin for a few days. Patient has intact motor sensation so no advantage to opening the upper arm again - per vasc sx, will need anticoagulation for at least 3 months for presumed thromboembolism to left brachial artery, follow-up with Dr. Scot Dock in 2 weeks.  - Discussed with Dr. Trula Slade, okay to start heparin drip 11/24, H&H remained stable, patient tolerating heparin drip, will start Coumadin today.  -  Appreciate cardiology recommendations, Dr Bronson Ing, no documented A. fib EF normal, embolism may have been due to endothelial dysfunction, do not recommend loop or event recorder at this time and continue decannulation as recommended by vascular surgery.  UTI  - Treated with fluconazole and Rocephin. Stable Renal US.   Essential hypertension. Currently on Norvasc and hydralazine continued.  Chronic diastolic dysfunction with EF 55-60%. Clinically compensated monitor of diuretic. Mild troponin elevation not an ACS pattern, EKG nonacute, on aspirin, cannot use beta blocker due to bradycardia, no further workup per cardiology who has seen the patient. Chest pain-free.  1/2 GPC  bacteremia:  confirmed coag negative staph which is contamination. Antibiotics discontinued   Anemia. -Currently stable  Code Status: Full code  Family Communication: Discussed in detail with the patient, all imaging results, lab results explained to the patient   Disposition Plan: Will likely need skilled nursing facility, PT eval ordered  Time Spent in minutes  25 minutes  Procedures  TTE  Left ventricle: The cavity size was normal. Wall thickness wasincreased in a pattern of moderate LVH. Systolic function wasnormal. The estimated ejection fraction was in the range of 55%to 60%. Wall motion was normal; there were no regional wallmotion abnormalities. - Aortic valve: Valve area (VTI): 1.97 cm^2. Valve area (Vmax): 1.77 cm^2. Valve area (Vmean): 2.09 cm^2. - Left atrium: The atrium was mildly dilated.  Vascular Ultrasound Upper Extremity Arterial Duplex has been completed. Preliminary findings: Left = No arterial flow visualized throughout left upper extremity. The left subclavian, axillary, brachial, radial, and ulnar arteries were evaluated.  Right subclavian artery was compared and demonstrates biphasic flow.  Left arm brachial embolectomy on 12/15/2014 by Dr. Scot Dock.  Renal US - Stable  Consults   Nephrology, vascular surgery  DVT Prophylaxis  heparin   Medications  Scheduled Meds: . amLODipine  10 mg Oral Daily  . antiseptic oral rinse  7 mL Mouth Rinse BID  . aspirin  81 mg Oral Daily  . bisacodyl  10 mg Oral q morning - 10a  . calcitRIOL  0.25 mcg Oral Daily  . calcium acetate  667 mg Oral TID WC  . carbamazepine  400 mg Oral BID  . citalopram  20 mg Oral Daily  . [START ON 12/24/2014] darbepoetin (ARANESP) injection - NON-DIALYSIS  150 mcg Subcutaneous Q Tue-1800  . docusate sodium  200 mg Oral BID  . ezetimibe-simvastatin  1 tablet Oral QHS  . furosemide  160 mg Oral BID  . hydrALAZINE  100 mg Oral 3 times per day  . insulin aspart  0-15 Units  Subcutaneous TID WC  . insulin aspart  4 Units Subcutaneous TID WC  . insulin glargine  15 Units Subcutaneous QHS  . lacosamide  100 mg Oral BID  . nitroGLYCERIN  0.5 inch Topical 4 times per day  . pantoprazole  40 mg Oral Daily  . sodium bicarbonate  1,300 mg Oral BID   Continuous Infusions: . heparin 800 Units/hr (12/19/14 1224)   PRN Meds:.acetaminophen **OR** [DISCONTINUED] acetaminophen, albuterol, clobetasol cream, diphenhydrAMINE, hydrALAZINE, metoprolol, ondansetron (ZOFRAN) IV, oxyCODONE-acetaminophen, senna-docusate   Antibiotics   Anti-infectives    Start     Dose/Rate Route Frequency Ordered Stop   12/16/14 0600  ceFAZolin (ANCEF) IVPB 1 g/50 mL premix  Status:  Discontinued    Comments:  Send with pt to OR   1 g 100 mL/hr over 30 Minutes Intravenous To ShortStay Surgical 12/15/14 1720 12/15/14 2056   12/15/14 2045  vancomycin (VANCOCIN) 1,500 mg  in sodium chloride 0.9 % 500 mL IVPB  Status:  Discontinued     1,500 mg 250 mL/hr over 120 Minutes Intravenous Every 48 hours 12/15/14 0921 12/16/14 1117   12/13/14 2045  vancomycin (VANCOCIN) IVPB 1000 mg/200 mL premix  Status:  Discontinued     1,000 mg 200 mL/hr over 60 Minutes Intravenous Every 48 hours 12/13/14 2031 12/15/14 0921   12/12/14 0800  cefTRIAXone (ROCEPHIN) 1 g in dextrose 5 % 50 mL IVPB  Status:  Discontinued     1 g 100 mL/hr over 30 Minutes Intravenous Every 24 hours 12/12/14 0725 12/16/14 1117   12/11/14 1230  fluconazole (DIFLUCAN) tablet 100 mg  Status:  Discontinued     100 mg Oral Daily 12/11/14 1222 12/16/14 1117        Subjective:   [name] was seen and examined today. Patient denies dizziness, chest pain, shortness of breath, abdominal pain, N/V/D/C. tolerating diet, left arm pain improving. No fevers or chills, no acute issues overnight.  Objective:   Blood pressure 133/50, pulse 73, temperature 98.7 F (37.1 C), temperature source Oral, resp. rate 16, height 5\' 2"  (1.575 m), weight  108.319 kg (238 lb 12.8 oz), SpO2 93 %.  Wt Readings from Last 3 Encounters:  12/20/14 108.319 kg (238 lb 12.8 oz)  11/22/14 109.68 kg (241 lb 12.8 oz)  07/24/14 113.445 kg (250 lb 1.6 oz)     Intake/Output Summary (Last 24 hours) at 12/20/14 1104 Last data filed at 12/20/14 0700  Gross per 24 hour  Intake  988.8 ml  Output   2950 ml  Net -1961.2 ml    Exam  General: Alert and oriented x 3, NAD  HEENT:  PERRLA, EOMI  Neck: Supple, no JVD, no masses  CVS: S1 S2clear, RRR  Respiratory: CTAB  Abdomen: Soft, nontender, nondistended, + bowel sounds  Ext: no cyanosis clubbing or edema, left arm painful radial pulse+  Neuro: no new deficits  Skin: No rashes  Psych: Normal affect and demeanor, alert and oriented x3    Data Review   Micro Results Recent Results (from the past 240 hour(s))  Urine culture     Status: None   Collection Time: 12/11/14  8:58 AM  Result Value Ref Range Status   Specimen Description URINE, RANDOM  Final   Special Requests NONE  Final   Culture MULTIPLE SPECIES PRESENT, SUGGEST RECOLLECTION  Final   Report Status 12/13/2014 FINAL  Final  Culture, blood (routine x 2)     Status: None   Collection Time: 12/12/14  4:50 PM  Result Value Ref Range Status   Specimen Description BLOOD RIGHT ARM  Final   Special Requests IN PEDIATRIC BOTTLE 1CC  Final   Culture  Setup Time   Final    GRAM POSITIVE COCCI IN CLUSTERS AEROBIC BOTTLE ONLY CRITICAL RESULT CALLED TO, READ BACK BY AND VERIFIED WITH: Dolores Hoose RN 2014 12/13/14 A BROWNING    Culture   Final    STAPHYLOCOCCUS SPECIES (COAGULASE NEGATIVE) THE SIGNIFICANCE OF ISOLATING THIS ORGANISM FROM A SINGLE SET OF BLOOD CULTURES WHEN MULTIPLE SETS ARE DRAWN IS UNCERTAIN. PLEASE NOTIFY THE MICROBIOLOGY DEPARTMENT WITHIN ONE WEEK IF SPECIATION AND SENSITIVITIES ARE REQUIRED.    Report Status 12/16/2014 FINAL  Final  Culture, blood (routine x 2)     Status: None   Collection Time: 12/12/14  4:59 PM   Result Value Ref Range Status   Specimen Description BLOOD RIGHT ANTECUBITAL  Final   Special Requests  BOTTLES DRAWN AEROBIC AND ANAEROBIC 5CC  Final   Culture NO GROWTH 5 DAYS  Final   Report Status 12/17/2014 FINAL  Final  Surgical pcr screen     Status: None   Collection Time: 12/15/14  9:06 AM  Result Value Ref Range Status   MRSA, PCR NEGATIVE NEGATIVE Final   Staphylococcus aureus NEGATIVE NEGATIVE Final    Comment:        The Xpert SA Assay (FDA approved for NASAL specimens in patients over 49 years of age), is one component of a comprehensive surveillance program.  Test performance has been validated by [facility] for patients greater than or equal to 39 year old. It is not intended to diagnose infection nor to guide or monitor treatment.     Radiology Reports Dg Chest 2 View  12/14/2014  CLINICAL DATA:  Patient with shortness of breath. EXAM: CHEST  2 VIEW COMPARISON:  Chest radiograph 12/13/2014 FINDINGS: Monitoring leads overlie the patient. Stable enlarged cardiac and mediastinal contours. Elevation of the right hemidiaphragm. No consolidative pulmonary opacities. No pleural effusion or pneumothorax. Regional skeleton is unremarkable. IMPRESSION: No acute cardiopulmonary process. Cardiomegaly. Electronically Signed   By: Lovey Newcomer M.D.   On: 12/14/2014 14:43   Dg Chest 2 View  12/11/2014  CLINICAL DATA:  Bradycardia and vomiting since yesterday. EXAM: CHEST  2 VIEW COMPARISON:  None. FINDINGS: Enlarged cardiac silhouette similar to prior. Exam is lordotic which exaggerates the cardiac silhouette. Interval increase in central venous congestion. Low lung volumes. No pleural fluid. IMPRESSION: Cardiomegaly and increased central venous congestion. Electronically Signed   By: Suzy Bouchard M.D.   On: 12/11/2014 09:31   US Renal  12/12/2014  CLINICAL DATA:  Acute renal failure EXAM: RENAL / URINARY TRACT ULTRASOUND COMPLETE COMPARISON:  None. FINDINGS: Right Kidney:  Length: 9.7 cm. Echogenicity within normal limits. No mass or hydronephrosis visualized. Left Kidney: Length: 9.2 cm. Echogenicity within normal limits. No mass or hydronephrosis visualized. Bladder: Decompressed following voiding IMPRESSION: No acute abnormality noted. Electronically Signed   By: Inez Catalina M.D.   On: 12/12/2014 16:56   Dg Chest Port 1 View  12/13/2014  CLINICAL DATA:  Shortness of breath. EXAM: PORTABLE CHEST 1 VIEW COMPARISON:  December 12, 2014 FINDINGS: Stable mild cardiomegaly is noted with mild central pulmonary vascular congestion. No pneumothorax or pleural effusion is noted. Both lungs are clear. Osteophyte formation is noted in mid thoracic spine. IMPRESSION: Stable mild cardiomegaly with central pulmonary vascular congestion. No significant change compared to prior exam. Electronically Signed   By: Marijo Conception, M.D.   On: 12/13/2014 07:55   Dg Chest Port 1 View  12/12/2014  CLINICAL DATA:  Shortness of breath for 2 days EXAM: PORTABLE CHEST 1 VIEW COMPARISON:  12/11/2014 FINDINGS: Cardiomegaly again noted. Mild thoracic dextroscoliosis. Degenerative changes thoracic spine. Central mild vascular congestion without pulmonary edema. No focal infiltrate IMPRESSION: Cardiomegaly. Mild thoracic dextroscoliosis. Central vascular congestion without convincing pulmonary edema. No segmental infiltrate. Electronically Signed   By: Lahoma Crocker M.D.   On: 12/12/2014 08:00   Dg Abd Portable 1v  12/12/2014  CLINICAL DATA:  Nausea EXAM: PORTABLE ABDOMEN - 1 VIEW COMPARISON:  CT abdomen 08/06/2009 FINDINGS: Retained stool in the colon. Negative for bowel obstruction. No renal calculi. Lumbar degenerative change at L4-5 and L5-S1. No acute skeletal abnormality IMPRESSION: Constipation without bowel obstruction. Electronically Signed   By: Franchot Gallo M.D.   On: 12/12/2014 10:28    CBC  Recent Labs Lab  12/17/14 1211 12/17/14 2021 12/18/14 0328 12/18/14 1831 12/19/14 0359  12/20/14 0258  WBC 10.8*  --  9.8 9.9 8.3 7.9  HGB 7.1* 8.4* 7.2* 9.8* 8.7* 8.9*  HCT 20.7* 24.5* 21.1* 27.7* 25.4* 26.3*  PLT 171  --  159 153 173 206  MCV 91.2  --  90.2 88.2 88.8 89.8  MCH 31.3  --  30.8 31.2 30.4 30.4  MCHC 34.3  --  34.1 35.4 34.3 33.8  RDW 13.5  --  13.4 13.9 13.8 13.7    Chemistries   Recent Labs Lab 12/16/14 0444 12/16/14 1620 12/17/14 0608 12/18/14 0328 12/19/14 0359 12/20/14 0258  NA 136  --  138 136 134* 132*  K 6.0* 4.2 3.9 3.9 3.8 3.6  CL 105  --  104 103 99* 96*  CO2 17*  --  20* 23 25 26   GLUCOSE 167*  --  124* 135* 87 80  BUN 45*  --  50* 46* 44* 44*  CREATININE 3.24*  --  3.94* 3.78* 3.53* 3.23*  CALCIUM 8.5*  --  8.1* 8.1* 8.3* 8.4*  MG 2.2  --  1.9  --   --   --   AST  --   --  180*  --   --   --   ALT  --   --  34  --   --   --   ALKPHOS  --   --  64  --   --   --   BILITOT  --   --  0.8  --   --   --    ------------------------------------------------------------------------------------------------------------------ estimated creatinine clearance is 19.6 mL/min (by C-G formula based on Cr of 3.23). ------------------------------------------------------------------------------------------------------------------ No results for input(s): HGBA1C in the last 72 hours. ------------------------------------------------------------------------------------------------------------------ No results for input(s): CHOL, HDL, LDLCALC, TRIG, CHOLHDL, LDLDIRECT in the last 72 hours. ------------------------------------------------------------------------------------------------------------------ No results for input(s): TSH, T4TOTAL, T3FREE, THYROIDAB in the last 72 hours.  Invalid input(s): FREET3 ------------------------------------------------------------------------------------------------------------------  Recent Labs  12/17/14 1211  VITAMINB12 427  FOLATE 8.7  FERRITIN 326*  TIBC 179*  IRON 275*  RETICCTPCT 4.5*    Coagulation  profile  Recent Labs Lab 12/15/14 0800  INR 1.07    No results for input(s): DDIMER in the last 72 hours.  Cardiac Enzymes No results for input(s): CKMB, TROPONINI, MYOGLOBIN in the last 168 hours.  Invalid input(s): CK ------------------------------------------------------------------------------------------------------------------ Invalid input(s): POCBNP   Recent Labs  12/18/14 2053 12/19/14 0730 12/19/14 1157 12/19/14 1648 12/19/14 2107 12/20/14 0734  GLUCAP 99 83 121* 133* 4* 38     RAI,RIPUDEEP M.D. [facility] 12/20/2014, 11:04 AM  Pager: [phone] Between 7am to 7pm - call Pager - [phone]  After 7pm go to [email or web address] - password [email or web address]  Call night coverage person covering after 7pm

## 2014-12-20 NOTE — Progress Notes (Signed)
ANTICOAGULATION CONSULT NOTE - Follow Up Consult  Pharmacy Consult for heparin Indication: left arm arterial thrombus  Allergies  Allergen Reactions  . Codeine Nausea And Vomiting    Patient Measurements: Height: 5\' 2"  (157.5 cm) Weight: 238 lb 12.8 oz (108.319 kg) IBW/kg (Calculated) : 50.1 Heparin Dosing Weight: 78 kg  Vital Signs: Temp: 98.7 F (37.1 C) (11/25 0351) Temp Source: Oral (11/25 0351) BP: 133/50 mmHg (11/25 [id number]) Pulse Rate: 73 (11/24 2012)  Labs:  Recent Labs  12/17/14 1211  12/18/14 0328 12/18/14 1831 12/19/14 0359 12/19/14 2030 12/20/14 0258  HGB 7.1*  < > 7.2* 9.8* 8.7*  --  8.9*  HCT 20.7*  < > 21.1* 27.7* 25.4*  --  26.3*  PLT 171  --  159 153 173  --  206  HEPARINUNFRC 0.54  --   --   --   --  0.41 0.27*  CREATININE  --   --  3.78*  --  3.53*  --  3.23*  CKTOTAL 65784*  --   --   --   --   --   --   < > = values in this interval not displayed.  Estimated Creatinine Clearance: 19.6 mL/min (by C-G formula based on Cr of 3.23).  Assessment: 69 yo female now POD #5 left brachial embolectomy with patch angioplasty.  Pharmacy consulted to manage IV heparin for left arm arterial thrombus.  IV heparin had been held 11/22 and 11/23 d/t drop in CBC but has now been resumed.  Heparin level this morning is below goal at 0.27 on 800 units/hr. After speaking with RN, patient lost her IV site early this morning and heparin had been off. Heparin level drawn shortly after it was resumed so this may explain low level. Planning 3 months anticoagulation.  Goal of Therapy:  Heparin level 0.3-0.7 units/ml Monitor platelets by anticoagulation protocol: Yes   Plan:  - Continue heparin drip at 800 units/hr - 6 hr heparin level - Daily heparin level and CBC - Monitor for s/sx of bleeding  [facility], [place].D., BCPS Clinical Pharmacist Pager: [phone] 12/20/2014 8:05 AM

## 2014-12-20 NOTE — Progress Notes (Signed)
Physical Therapy Treatment Patient Details Name: [name] MRN: [id number] DOB: [birth year] Today's Date: 12/20/2014    History of Present Illness [name] is a 69 y.o. female PMH of CHF, CKD, DM, HTN, seizures and morbid obesity who presented to the ED 12-11-14 via EMS with nausea and vomiting. Upon exam she was noted to have bradycardia, increased creatinine and vascular congestion. She was therefore admitted for further treatment.    PT Comments    Pt remains very weak and with very limited mobility. Pt with myoclonic type jerks in upper and lower extremities with standing for short time. Feel pt will require ST-SNF at dc.  Follow Up Recommendations  SNF     Equipment Recommendations  None recommended by PT    Recommendations for Other Services       Precautions / Restrictions Precautions Precautions: Fall Restrictions Weight Bearing Restrictions: No    Mobility  Bed Mobility               General bed mobility comments: Up in recliner  Transfers Overall transfer level: Needs assistance Equipment used: Rolling walker (2 wheeled) Transfers: Sit to/from Stand Sit to Stand: Mod assist         General transfer comment: Pt with limited use of LUE with transfer due to soreness after thrombectomy. Verbal cues for rt hand placement and assist to bring hips up  Ambulation/Gait             General Gait Details: Pt unable to amb. In standing pt with myoclonic type jerks of upper and lower extremities.   Stairs            Wheelchair Mobility    Modified Rankin (Stroke Patients Only)       Balance Overall balance assessment: Needs assistance Sitting-balance support: No upper extremity supported Sitting balance-Leahy Scale: Fair     Standing balance support: Single extremity supported Standing balance-Leahy Scale: Poor Standing balance comment: support of walker and min A. Pt stood x 2 for 30-45 secs. Pt begins to have myoclonic type  jerks of upper and lower extremities making gait unsafe                    Cognition Arousal/Alertness: Awake/alert (up in chair) Behavior During Therapy: [facility] for tasks assessed/performed Overall Cognitive Status: No family/caregiver present to determine baseline cognitive functioning                      Exercises General Exercises - Lower Extremity Long Arc Quad: AAROM;Both;10 reps;Seated    General Comments        Pertinent Vitals/Pain Pain Assessment: Faces Faces Pain Scale: Hurts a little bit Pain Location: LUE Pain Descriptors / Indicators: Sore Pain Intervention(s): Limited activity within patient's tolerance;Monitored during session;Repositioned    Home Living                      Prior Function            PT Goals (current goals can now be found in the care plan section) Acute Rehab PT Goals Patient Stated Goal: none stated Progress towards PT goals: Not progressing toward goals - comment (pt with myoclonic jerks)    Frequency  Min 2X/week    PT Plan Discharge plan needs to be updated;Frequency needs to be updated    Co-evaluation             End of Session Equipment Utilized During Treatment:  Gait belt Activity Tolerance: Patient limited by fatigue Patient left: in chair;with call bell/phone within reach     Time: [id number] PT Time Calculation (min) (ACUTE ONLY): 11 min  Charges:  $Therapeutic Activity: 8-22 mins                    G Codes:      [name] 12-21-2014, 3:45 PM [facility] PT [phone]

## 2014-12-21 DIAGNOSIS — K219 Gastro-esophageal reflux disease without esophagitis: Secondary | ICD-10-CM | POA: Diagnosis not present

## 2014-12-21 DIAGNOSIS — S41102S Unspecified open wound of left upper arm, sequela: Secondary | ICD-10-CM | POA: Diagnosis not present

## 2014-12-21 DIAGNOSIS — G4733 Obstructive sleep apnea (adult) (pediatric): Secondary | ICD-10-CM | POA: Diagnosis not present

## 2014-12-21 DIAGNOSIS — F339 Major depressive disorder, recurrent, unspecified: Secondary | ICD-10-CM | POA: Diagnosis not present

## 2014-12-21 DIAGNOSIS — L98492 Non-pressure chronic ulcer of skin of other sites with fat layer exposed: Secondary | ICD-10-CM | POA: Diagnosis not present

## 2014-12-21 DIAGNOSIS — G4089 Other seizures: Secondary | ICD-10-CM | POA: Diagnosis not present

## 2014-12-21 DIAGNOSIS — N179 Acute kidney failure, unspecified: Secondary | ICD-10-CM | POA: Diagnosis not present

## 2014-12-21 DIAGNOSIS — E785 Hyperlipidemia, unspecified: Secondary | ICD-10-CM | POA: Diagnosis not present

## 2014-12-21 DIAGNOSIS — M7989 Other specified soft tissue disorders: Secondary | ICD-10-CM | POA: Diagnosis not present

## 2014-12-21 DIAGNOSIS — D631 Anemia in chronic kidney disease: Secondary | ICD-10-CM | POA: Diagnosis not present

## 2014-12-21 DIAGNOSIS — E119 Type 2 diabetes mellitus without complications: Secondary | ICD-10-CM | POA: Diagnosis not present

## 2014-12-21 DIAGNOSIS — M79602 Pain in left arm: Secondary | ICD-10-CM | POA: Diagnosis not present

## 2014-12-21 DIAGNOSIS — N184 Chronic kidney disease, stage 4 (severe): Secondary | ICD-10-CM | POA: Diagnosis not present

## 2014-12-21 DIAGNOSIS — M6281 Muscle weakness (generalized): Secondary | ICD-10-CM | POA: Diagnosis not present

## 2014-12-21 DIAGNOSIS — I10 Essential (primary) hypertension: Secondary | ICD-10-CM | POA: Diagnosis not present

## 2014-12-21 DIAGNOSIS — N390 Urinary tract infection, site not specified: Secondary | ICD-10-CM | POA: Diagnosis not present

## 2014-12-21 DIAGNOSIS — R600 Localized edema: Secondary | ICD-10-CM | POA: Diagnosis not present

## 2014-12-21 DIAGNOSIS — N183 Chronic kidney disease, stage 3 (moderate): Secondary | ICD-10-CM | POA: Diagnosis not present

## 2014-12-21 DIAGNOSIS — I5030 Unspecified diastolic (congestive) heart failure: Secondary | ICD-10-CM | POA: Diagnosis not present

## 2014-12-21 DIAGNOSIS — I742 Embolism and thrombosis of arteries of the upper extremities: Secondary | ICD-10-CM | POA: Diagnosis not present

## 2014-12-21 DIAGNOSIS — R001 Bradycardia, unspecified: Secondary | ICD-10-CM | POA: Diagnosis not present

## 2014-12-21 DIAGNOSIS — I4891 Unspecified atrial fibrillation: Secondary | ICD-10-CM | POA: Diagnosis not present

## 2014-12-21 LAB — TYPE AND SCREEN
ABO/RH(D): B POS
ANTIBODY SCREEN: NEGATIVE
UNIT DIVISION: 0
Unit division: 0
Unit division: 0

## 2014-12-21 LAB — BASIC METABOLIC PANEL
Anion gap: 11 (ref 5–15)
BUN: 42 mg/dL — ABNORMAL HIGH (ref 6–20)
CALCIUM: 8.5 mg/dL — AB (ref 8.9–10.3)
CO2: 29 mmol/L (ref 22–32)
CREATININE: 3.12 mg/dL — AB (ref 0.44–1.00)
Chloride: 92 mmol/L — ABNORMAL LOW (ref 101–111)
GFR calc Af Amer: 17 mL/min — ABNORMAL LOW (ref >60)
GFR calc non Af Amer: 14 mL/min — ABNORMAL LOW (ref >60)
GLUCOSE: 80 mg/dL (ref 65–99)
Potassium: 3.4 mmol/L — ABNORMAL LOW (ref 3.5–5.1)
Sodium: 132 mmol/L — ABNORMAL LOW (ref 135–145)

## 2014-12-21 LAB — PROTIME-INR
INR: 1.06 (ref 0.00–1.49)
Prothrombin Time: 14 seconds (ref 11.6–15.2)

## 2014-12-21 LAB — CBC
HEMATOCRIT: 26.7 % — AB (ref 36.0–46.0)
Hemoglobin: 9.3 g/dL — ABNORMAL LOW (ref 12.0–15.0)
MCH: 31.6 pg (ref 26.0–34.0)
MCHC: 34.8 g/dL (ref 30.0–36.0)
MCV: 90.8 fL (ref 78.0–100.0)
Platelets: 222 10*3/uL (ref 150–400)
RBC: 2.94 MIL/uL — ABNORMAL LOW (ref 3.87–5.11)
RDW: 14.1 % (ref 11.5–15.5)
WBC: 8.4 10*3/uL (ref 4.0–10.5)

## 2014-12-21 LAB — GLUCOSE, CAPILLARY
GLUCOSE-CAPILLARY: 96 mg/dL (ref 65–99)
Glucose-Capillary: 146 mg/dL — ABNORMAL HIGH (ref 65–99)

## 2014-12-21 LAB — HEPARIN LEVEL (UNFRACTIONATED): Heparin Unfractionated: 0.34 IU/mL (ref 0.30–0.70)

## 2014-12-21 MED ORDER — SODIUM BICARBONATE 650 MG PO TABS: 1300.0000 mg | ORAL_TABLET | Freq: Two times a day (BID) | ORAL | Status: DC

## 2014-12-21 MED ORDER — CALCIUM ACETATE (PHOS BINDER) 667 MG PO CAPS: 667.0000 mg | ORAL_CAPSULE | Freq: Three times a day (TID) | ORAL | Status: DC

## 2014-12-21 MED ORDER — ASPIRIN 81 MG PO CHEW: 81.0000 mg | CHEWABLE_TABLET | Freq: Every day | ORAL | Status: AC

## 2014-12-21 MED ORDER — WARFARIN SODIUM 10 MG PO TABS: 10.0000 mg | ORAL_TABLET | Freq: Once | ORAL | Status: DC

## 2014-12-21 MED ORDER — WARFARIN SODIUM 5 MG PO TABS: ORAL_TABLET | ORAL | Status: DC

## 2014-12-21 MED ORDER — FUROSEMIDE 80 MG PO TABS: 160.0000 mg | ORAL_TABLET | Freq: Two times a day (BID) | ORAL | Status: DC

## 2014-12-21 MED ORDER — ALBUTEROL SULFATE (2.5 MG/3ML) 0.083% IN NEBU: 2.5000 mg | INHALATION_SOLUTION | Freq: Four times a day (QID) | RESPIRATORY_TRACT | Status: DC | PRN

## 2014-12-21 MED ORDER — ENOXAPARIN SODIUM 100 MG/ML ~~LOC~~ SOLN: 100.0000 mg | SUBCUTANEOUS | Status: DC

## 2014-12-21 MED ORDER — CALCITRIOL 0.25 MCG PO CAPS: 0.2500 ug | ORAL_CAPSULE | Freq: Every day | ORAL | Status: DC

## 2014-12-21 MED ORDER — HYDRALAZINE HCL 100 MG PO TABS: 100.0000 mg | ORAL_TABLET | Freq: Three times a day (TID) | ORAL | Status: DC

## 2014-12-21 MED ORDER — BISACODYL 5 MG PO TBEC: 10.0000 mg | DELAYED_RELEASE_TABLET | Freq: Every morning | ORAL | Status: DC

## 2014-12-21 MED ORDER — ENOXAPARIN SODIUM 100 MG/ML ~~LOC~~ SOLN
100.0000 mg | SUBCUTANEOUS | Status: DC
  Administered 2014-12-21: 100 mg via SUBCUTANEOUS
  Filled 2014-12-21: qty 1

## 2014-12-21 MED ORDER — OXYCODONE-ACETAMINOPHEN 5-325 MG PO TABS: 1.0000 | ORAL_TABLET | Freq: Four times a day (QID) | ORAL | Status: DC | PRN

## 2014-12-21 MED ORDER — DOCUSATE SODIUM 100 MG PO CAPS: 200.0000 mg | ORAL_CAPSULE | Freq: Two times a day (BID) | ORAL | Status: DC

## 2014-12-21 MED ORDER — POTASSIUM CHLORIDE CRYS ER 20 MEQ PO TBCR
40.0000 meq | EXTENDED_RELEASE_TABLET | Freq: Once | ORAL | Status: AC
  Administered 2014-12-21: 40 meq via ORAL
  Filled 2014-12-21: qty 2

## 2014-12-21 NOTE — Progress Notes (Signed)
    Subjective  - POD #6, status post left brachial embolectomy  Complaining of some numbness in her fingers and swelling in her arm   Physical Exam:  Palpable left radial pulse Decreased grip strength Hematoma around the incision which is stable        Assessment/Plan:  POD #6  Arm swelling is stable. Good perfusion to the left arm Continue with elevation of the arm to minimize swelling No indication for reexploration of wound Okay for full dose anticoagulation  Celester Lech, [place] 12/21/2014 10:12 AM --  Filed Vitals:   12/21/14 0034 12/21/14 0427  BP: 166/58 139/54  Pulse: 73 80  Temp: 99.4 F (37.4 C) 99.2 F (37.3 C)  Resp: 14 21    Intake/Output Summary (Last 24 hours) at 12/21/14 1012 Last data filed at 12/21/14 0800  Gross per 24 hour  Intake   1392 ml  Output   1050 ml  Net    342 ml     Laboratory CBC    Component Value Date/Time   WBC 8.4 12/21/2014 0403   HGB 9.3* 12/21/2014 0403   HCT 26.7* 12/21/2014 0403   PLT 222 12/21/2014 0403    BMET    Component Value Date/Time   NA 132* 12/21/2014 0403   K 3.4* 12/21/2014 0403   CL 92* 12/21/2014 0403   CO2 29 12/21/2014 0403   GLUCOSE 80 12/21/2014 0403   BUN 42* 12/21/2014 0403   CREATININE 3.12* 12/21/2014 0403   CALCIUM 8.5* 12/21/2014 0403   GFRNONAA 14* 12/21/2014 0403   GFRAA 17* 12/21/2014 0403    COAG Lab Results  Component Value Date   INR 1.06 12/21/2014   INR 1.07 12/15/2014   INR 1.07 09/26/2012   No results found for: PTT  Antibiotics Anti-infectives    Start     Dose/Rate Route Frequency Ordered Stop   12/16/14 0600  ceFAZolin (ANCEF) IVPB 1 g/50 mL premix  Status:  Discontinued    Comments:  Send with pt to OR   1 g 100 mL/hr over 30 Minutes Intravenous To [facility] 12/15/14 1720 12/15/14 2056   12/15/14 2045  vancomycin (VANCOCIN) 1,500 mg in sodium chloride 0.9 % 500 mL IVPB  Status:  Discontinued     1,500 mg 250 mL/hr over 120 Minutes Intravenous  Every 48 hours 12/15/14 0921 12/16/14 1117   12/13/14 2045  vancomycin (VANCOCIN) IVPB 1000 mg/200 mL premix  Status:  Discontinued     1,000 mg 200 mL/hr over 60 Minutes Intravenous Every 48 hours 12/13/14 2031 12/15/14 0921   12/12/14 0800  cefTRIAXone (ROCEPHIN) 1 g in dextrose 5 % 50 mL IVPB  Status:  Discontinued     1 g 100 mL/hr over 30 Minutes Intravenous Every 24 hours 12/12/14 0725 12/16/14 1117   12/11/14 1230  fluconazole (DIFLUCAN) tablet 100 mg  Status:  Discontinued     100 mg Oral Daily 12/11/14 1222 12/16/14 1117       V. Leia Alf, M.D. [facility] Office: [phone] Pager:  [phone]

## 2014-12-21 NOTE — Care Management Note (Signed)
Case Management Note  Patient Details  Name: [name] MRN: [id number] Date of Birth: 05/06/1945  Subjective/Objective:        Bradycardia            Action/Plan:  Scheduled dc to SNF. CSW following for SNF.   Expected Discharge Date:  12/21/2014              Expected Discharge Plan:  [place]  In-House Referral:  Clinical Social Work  Discharge planning Services  CM Consult    Status of Service:  Completed, signed off  Medicare Important Message Given:  Yes Date Medicare IM Given:    Medicare IM give by:    Date Additional Medicare IM Given:    Additional Medicare Important Message give by:     If discussed at [place] of Stay Meetings, dates discussed:    Additional Comments:  Erenest Rasher, RN 12/21/2014, 3:25 PM

## 2014-12-21 NOTE — Plan of Care (Signed)
Problem: Pain Managment: Goal: General experience of comfort will improve Outcome: Progressing Patient is still experiencing pain to the left arm and to the incision but is able to call and rate it accordingly and ask for pain meds as needed

## 2014-12-21 NOTE — Progress Notes (Signed)
Report given in patient's room, using SBAR format via [employer], updated on patient's condition, VS, labs and new orders, assumed care of patient.

## 2014-12-21 NOTE — Discharge Summary (Signed)
Physician Discharge Summary   Patient ID: [name] MRN: [id number] DOB/AGE: 05/14/1945 69 y.o.  Admit date: 12/11/2014 Discharge date: 12/21/2014  Primary Care Physician:  Benito Mccreedy, MD  Discharge Diagnoses:    . Bradycardia . OSA (obstructive sleep apnea) . Morbid obesity ([place]) . HYPERTENSION, BENIGN . Hyperlipidemia . Elevated troponin . CKD (chronic kidney disease) stage 3, GFR 30-59 ml/min . Acute renal failure superimposed on stage 3 chronic kidney disease ([place]) . Vomiting . CKD (chronic kidney disease), stage IV ([place]) . Essential hypertension  Consults:   Vascular surgery Nephrology Cardiology  Recommendations for Outpatient Follow-up:  1. Please check INR on Monday 12/23/14. 2. Continue Coumadin 10 mg today, tomorrow 11/27. Follow INR on 11/28 and adjust her dose of Coumadin accordingly. 3. Patient will need anticoagulation for 3 months. 4. Please stop Lovenox once INR above 2. Continue with elevation of the arm to minimize swelling    TESTS THAT NEED FOLLOW-UP INR/PT on 11/28, BMET   DIET: Carb modified diet    Allergies:   Allergies  Allergen Reactions  . Codeine Nausea And Vomiting     DISCHARGE MEDICATIONS: Current Discharge Medication List    START taking these medications   Details  albuterol (PROVENTIL) (2.5 MG/3ML) 0.083% nebulizer solution Take 3 mLs (2.5 mg total) by nebulization every 6 (six) hours as needed for wheezing or shortness of breath. Qty: 75 mL, Refills: 12    aspirin 81 MG chewable tablet Chew 1 tablet (81 mg total) by mouth daily.    bisacodyl (DULCOLAX) 5 MG EC tablet Take 2 tablets (10 mg total) by mouth every morning. Qty: 30 tablet, Refills: 0    calcitRIOL (ROCALTROL) 0.25 MCG capsule Take 1 capsule (0.25 mcg total) by mouth daily.    calcium acetate (PHOSLO) 667 MG capsule Take 1 capsule (667 mg total) by mouth 3 (three) times daily with meals.    docusate sodium (COLACE) 100 MG capsule Take 2  capsules (200 mg total) by mouth 2 (two) times daily. Qty: 10 capsule, Refills: 0    enoxaparin (LOVENOX) 100 MG/ML injection Inject 1 mL (100 mg total) into the skin daily. UNTIL INR above 2, then stop lovenox Qty: 0 Syringe    oxyCODONE-acetaminophen (PERCOCET/ROXICET) 5-325 MG tablet Take 1 tablet by mouth every 6 (six) hours as needed for moderate pain or severe pain. Qty: 30 tablet, Refills: 0    sodium bicarbonate 650 MG tablet Take 2 tablets (1,300 mg total) by mouth 2 (two) times daily.    warfarin (COUMADIN) 5 MG tablet Please take 10 mg (2tabs) today and tomorrow (11/27) evening, then check INR on 11/28. May reduce dose to 7.5 mg (1 and half tab) from 11/29 and adjust according to PT/INR readings.      CONTINUE these medications which have CHANGED   Details  furosemide (LASIX) 80 MG tablet Take 2 tablets (160 mg total) by mouth 2 (two) times daily.    hydrALAZINE (APRESOLINE) 100 MG tablet Take 1 tablet (100 mg total) by mouth every 8 (eight) hours.      CONTINUE these medications which have NOT CHANGED   Details  amLODipine (NORVASC) 10 MG tablet Take 10 mg by mouth daily.    Ascorbic Acid (VITAMIN C PO) Take 1 tablet by mouth daily.    carbamazepine (TEGRETOL) 200 MG tablet Take 400 mg by mouth 2 (two) times daily.    Cholecalciferol (VITAMIN D-3) 1000 UNITS CAPS Take 1,000 Units by mouth daily at 12 noon.  citalopram (CELEXA) 20 MG tablet Take 20 mg by mouth daily.    clobetasol cream (TEMOVATE) [id number] % Apply 1 application topically 2 (two) times daily as needed (for legs).     ezetimibe-simvastatin (VYTORIN) 10-40 MG per tablet Take 1 tablet by mouth daily.    glipiZIDE (GLUCOTROL XL) 5 MG 24 hr tablet Take 5 mg by mouth daily with breakfast.    HUMULIN 70/30 KWIKPEN (70-30) 100 UNIT/ML PEN Inject 20 Units into the skin 2 (two) times daily. Refills: 0    Lacosamide (VIMPAT) 100 MG TABS Take 1 tablet by mouth 2 (two) times daily.    omeprazole (PRILOSEC) 40  MG capsule Take 40 mg by mouth daily.      STOP taking these medications     ciprofloxacin (CIPRO) 500 MG tablet      cloNIDine (CATAPRES) 0.2 MG tablet      metoprolol succinate (TOPROL-XL) 50 MG 24 hr tablet          Brief H and P: For complete details please refer to admission H and P, but in briefThis is a pleasant 69 year old African-American female with past medical history of chronic kidney disease stage IV, diabetes mellitus, essential hypertension, morbid obesity, chronic diastolic heart failure who was admitted to the hospital initially due to nausea vomiting along with mild acute renal failure also was by constipation. This was stabilized and she was supposed to be discharged however on the day of anticipated discharge 2 days ago she developed sudden onset of left arm pain early in the morning around 5:30 AM.  Further workup showed that she had loss of blood flow, vascular surgery was emergently consulted, she was started on heparin drip and taken to the OR for left brachial artery embolectomy. She is currently on heparin drip with close vascular surgery follow-up, she still has weakness in the left arm, she has now developed some swelling in the left arm with drop in hemoglobin and hematocrit. Patient was transfused 1unit of packed RBC on 12/17/2014, vascular surgery following closely.  Hospital Course:   Nausea and vomiting- resolved, most likely due to underlying constipation - KUB shows stool burden but no obstruction, place on bowel regimen and monitor. Currently nausea free. As needed IV Zofran. Initiated bowel regimen for constipation.  - Tolerating diet   Bradycardia upon admission. Likely Medication related, beta blocker and clonidine has been held, heart rate better.  - TSH stable1.00 - 2-D echo showed EF of 55-60%, normal wall motion  - Cardiology was consulted, recommended no further workup. Clonidine and beta blocker were discontinued.  ARF and CK D stage  IV. Baseline creatinine close to 2.2, likely due to nausea vomiting dehydration, creatinine peaked at 4. -Patient was placed on gentle hydration, also received 2 unit RBCs during admission, creatinine now 3.1  - Renal ultrasound on 11/17 showed no acute abnormality, no hydronephrosis or obstruction - Patient was followed closely by nephrology service, recommended continuing Lasix and antihypertension meds, vitamin D, follow-up with Dr. Florene Glen after discharge in 2 weeks. No other workup at this time.  Left arm weakness/numbness started 5:30 AM 12/15/2014, brachial thrombus. - On exam, appeared cold with weak radial pulse, stat arterial duplex obtained with evidence of loss of blood flow, heparin drip started, continue aspirin, vascular surgery was following. Patient underwent emergent left brachial embolectomy on 12/15/2014 by vascular surgery. No left arm DVT. - Per vascular surgery, suspect some limited amount bleeding in the upper arm, may need to hold heparin for a few  days. Patient has intact motor sensation so no advantage to opening the upper arm again - per vasc sx, will need anticoagulation for at least 3 months for presumed thromboembolism to left brachial artery, follow-up with Dr. Scot Dock in 2 weeks.  - Heparin drip was restarted on 11/24, H&H remained stable. Coumadin restarted on 11/25. -Appreciate cardiology recommendations, Dr Bronson Ing, no documented A. fib EF normal, embolism may have been due to endothelial dysfunction, do not recommend loop or event recorder at this time and continue anticoagulation as recommended by vascular surgery.  UTI  - Treated with fluconazole and Rocephin. Stable Renal US.  Essential hypertension. Currently on Norvasc and hydralazine. Please note clonidine and beta blocker has been discontinued.  Chronic diastolic dysfunction with EF 55-60%. Clinically compensated monitor of diuretic. Mild troponin elevation not an ACS pattern, EKG nonacute, on  aspirin, cannot use beta blocker due to bradycardia, no further workup per cardiology who has seen the patient. Chest pain-free.  1/2 GPC bacteremia: confirmed coag negative staph which is contamination. Antibiotics discontinued  Anemia. -Currently stable  Diabetes mellitus Insulin-dependent, hemoglobin A1c 6.4 prior to admission, indicates good control, patient will continue her outpatient regimen  Day of Discharge BP 139/54 mmHg  Pulse 80  Temp(Src) 99.2 F (37.3 C) (Oral)  Resp 21  Ht 5\' 2"  (1.575 m)  Wt 107.639 kg (237 lb 4.8 oz)  BMI 43.39 kg/m2  SpO2 92%  Physical Exam: General: Alert and awake oriented x3 not in any acute distress. HEENT: anicteric sclera, pupils reactive to light and accommodation CVS: S1-S2 clear no murmur rubs or gallops Chest: clear to auscultation bilaterally, no wheezing rales or rhonchi Abdomen: soft nontender, nondistended, normal bowel sounds Extremities: no cyanosis, clubbing or edema noted bilaterally, palpable left radial pulse Neuro: Cranial nerves II-XII intact, no focal neurological deficits   The results of significant diagnostics from this hospitalization (including imaging, microbiology, ancillary and laboratory) are listed below for reference.    LAB RESULTS: Basic Metabolic Panel:  Recent Labs Lab 12/17/14 0608 12/18/14 0328  12/20/14 0258 12/21/14 0403  NA 138 136  < > 132* 132*  K 3.9 3.9  < > 3.6 3.4*  CL 104 103  < > 96* 92*  CO2 20* 23  < > 26 29  GLUCOSE 124* 135*  < > 80 80  BUN 50* 46*  < > 44* 42*  CREATININE 3.94* 3.78*  < > 3.23* 3.12*  CALCIUM 8.1* 8.1*  < > 8.4* 8.5*  MG 1.9  --   --   --   --   PHOS 5.6* 4.5  --   --   --   < > = values in this interval not displayed. Liver Function Tests:  Recent Labs Lab 12/17/14 0608 12/18/14 0328  AST 180*  --   ALT 34  --   ALKPHOS 64  --   BILITOT 0.8  --   PROT 5.5*  --   ALBUMIN 2.4* 2.4*   No results for input(s): LIPASE, AMYLASE in the last 168  hours. No results for input(s): AMMONIA in the last 168 hours. CBC:  Recent Labs Lab 12/20/14 0258 12/21/14 0403  WBC 7.9 8.4  HGB 8.9* 9.3*  HCT 26.3* 26.7*  MCV 89.8 90.8  PLT 206 222   Cardiac Enzymes:  Recent Labs Lab 12/17/14 1211  CKTOTAL [address]*   BNP: Invalid input(s): POCBNP CBG:  Recent Labs Lab 12/21/14 0728 12/21/14 1143  GLUCAP 96 146*    Significant Diagnostic Studies:  No results found.  2D ECHO: Study Conclusions  - Left ventricle: The cavity size was normal. Wall thickness was increased in a pattern of moderate LVH. Systolic function was normal. The estimated ejection fraction was in the range of 55% to 60%. Wall motion was normal; there were no regional wall motion abnormalities. - Aortic valve: Valve area (VTI): 1.97 cm^2. Valve area (Vmax): 1.77 cm^2. Valve area (Vmean): 2.09 cm^2. - Left atrium: The atrium was mildly dilated.  Disposition and Follow-up: Discharge Instructions    Diet Carb Modified    Complete by:  As directed      Increase activity slowly    Complete by:  As directed             DISPOSITION: Skilled nursing facility   DISCHARGE FOLLOW-UP Follow-up Information    Follow up with [facility].   Why:  HH- RN/PT/SW arranged   Contact information:   [address] [place] [address] [phone]       Follow up with Deitra Mayo, MD In 2 weeks.   Specialties:  Vascular Surgery, Cardiology   Why:  Our office will call you to arrange an appointment (sent)   Contact information:   [place] [phone]       Follow up with [name],[name], MD. Schedule an appointment as soon as possible for a visit in 2 weeks.   Specialty:  Internal Medicine   Why:  for hospital follow-up   Contact information:   [id number] [place] [address] [phone]       Follow up with [facility] C, MD. Schedule an appointment as soon  as possible for a visit in 2 weeks.   Specialty:  Nephrology   Why:  for hospital follow-up, obtain labs BMET   Contact information:   [place] [address] [phone]        Time spent on Discharge: 45 minutes  Signed:   Teona Vargus M.D. [facility] 12/21/2014, 12:46 PM Pager: [phone]

## 2014-12-21 NOTE — Progress Notes (Addendum)
ANTICOAGULATION CONSULT NOTE  ADDENDUM: Pharmacy consulted to dose lovenox for VTE treatment; transitioning from heparin. Heparin discontinued and will start lovenox. INR of 1.06 today after 7.5mg  dose yesterday. Will likely need higher doses of warfarin due to patient's weight and concomitant carbamazepine. Should be closely followed at discharge. Hgb 9.3, plt 222. No reported bleeding.   Today is D#2/5 of overlap with parenteral anticoagulation + warfarin.  -Discontinue heparin -Lovenox 100mg  daily -warfarin 10mg  po x1 tonight  -daily HL, CBC, INR -follow s/sx bleeding -AC for 3 months per VVS  Pharmacy Consult for heparin + warfarin Indication: left arm arterial thrombus  Allergies  Allergen Reactions  . Codeine Nausea And Vomiting    Patient Measurements: Height: 5\' 2"  (157.5 cm) Weight: 237 lb 4.8 oz (107.639 kg) IBW/kg (Calculated) : 50.1 Heparin Dosing Weight: 78 kg  Vital Signs: Temp: 99.2 F (37.3 C) (11/26 0427) Temp Source: Oral (11/26 0427) BP: 139/54 mmHg (11/26 0427) Pulse Rate: 80 (11/26 0427)  Labs:  Recent Labs  12/19/14 0359  12/20/14 0258 12/20/14 1025 12/21/14 0403  HGB 8.7*  --  8.9*  --  9.3*  HCT 25.4*  --  26.3*  --  26.7*  PLT 173  --  206  --  222  LABPROT  --   --   --   --  14.0  INR  --   --   --   --  1.06  HEPARINUNFRC  --   < > 0.27* 0.40 0.34  CREATININE 3.53*  --  3.23*  --  3.12*  < > = values in this interval not displayed.  Estimated Creatinine Clearance: 20.2 mL/min (by C-G formula based on Cr of 3.12).  Assessment: 69 yo female now POD #6 left brachial embolectomy with patch angioplasty.  Pharmacy consulted to manage IV heparin for left arm arterial thrombus.  IV heparin had been held 11/22 and 11/23 d/t drop in CBC but has now been resumed.  Heparin level this morning 0.34 and is therapeutic x2 at 800 units/hr. INR of 1.06 today after 7.5mg  dose yesterday. Will likely need higher doses of warfarin due to patient's weight  and concomitant carbamazepine. Hgb 9.3, plt 222. No reported bleeding. Plan per Dr. Josem Kaufmann note is for 3 months of anticoagulation. Today is D#2/5 of overlap with heparin + warfarin.  Goal of Therapy:  INR 2-3 Heparin level 0.3-0.7 units/ml Monitor platelets by anticoagulation protocol: Yes   Plan:  - Continue heparin drip at 800 units/hr - warfarin 10mg  po x1 tonight  - Daily INR, heparin level and CBC - Monitor for s/sx of bleeding   Stephens November, PharmD PGY1 Resident Pager: [phone] 12/21/2014 7:21 AM

## 2014-12-21 NOTE — Plan of Care (Signed)
Problem: Safety: Goal: Ability to remain free from injury will improve Outcome: Progressing Patient uses call light appropriately and personal items, phone and call light are within her reach, reminded her to use them if she needs assistance with anything and patient verbalized understanding

## 2014-12-21 NOTE — NC FL2 (Signed)
[place] LEVEL OF CARE SCREENING TOOL     IDENTIFICATION  Patient Name: [name] Birthdate: 09-15-1945 Sex: female Admission Date (Current Location): 12/11/2014  [place] and [place] Number: Kathleen Argue [id number] [place] and Address:  [facility], [place] [address] [place], [place] [address]      Provider Number: [id number]  Attending Physician Name and Address:  Mendel Corning, MD  Relative Name and Phone Number:  [name] [phone]    Current Level of Care: Hospital Recommended Level of Care: [place] Prior Approval Number:    Date Approved/Denied:   PASRR Number: [id number]  Discharge Plan: SNF    Current Diagnoses: Patient Active Problem List   Diagnosis Date Noted  . Junctional bradycardia   . Arterial embolism and thrombosis of upper extremity ([place])   . Bradycardia 12/11/2014  . Acute renal failure superimposed on stage 3 chronic kidney disease ([place]) 12/11/2014  . Vomiting 12/11/2014  . Acute renal failure ([place]) 12/11/2014  . CKD (chronic kidney disease), stage IV ([place])   . Essential hypertension   . Type II diabetes mellitus ([place])   . OSA (obstructive sleep apnea) 10/17/2012  . Elevated troponin 09/26/2012  . Acute encephalopathy 09/25/2012  . CKD (chronic kidney disease) stage 3, GFR 30-59 ml/min 09/25/2012  . Tremor 09/25/2012  . Rhabdomyolysis 09/25/2012  . Morbid obesity ([place]) 09/16/2006  . DEPRESSION, CHRONIC 09/16/2006  . Seizure ([place]) 09/16/2006  . Diabetes mellitus ([place]) 09/09/2006  . Hyperlipidemia 09/09/2006  . HYPERTENSION, BENIGN 09/09/2006  . CONSTIPATION, HX OF 09/09/2006    Orientation ACTIVITIES/SOCIAL BLADDER RESPIRATION    Self, Time, Situation, Place  Active Continent Normal  BEHAVIORAL SYMPTOMS/MOOD NEUROLOGICAL BOWEL NUTRITION STATUS      Continent Diet (Heart Healthy)  PHYSICIAN VISITS COMMUNICATION OF NEEDS Height & Weight Skin    Verbally 5\' 2"  (157.5  cm) 242 lbs. Surgical wounds          AMBULATORY STATUS RESPIRATION    Assist independent Normal      Personal Care Assistance Level of Assistance  Bathing, Dressing Bathing Assistance: Maximum assistance   Dressing Assistance: Limited assistance      Functional Limitations Info  Sight Sight Info: Adequate           SPECIAL CARE FACTORS FREQUENCY  PT (By licensed PT), OT (By licensed OT)     PT Frequency: 5x/week OT Frequency: 5x/week           Additional Factors Info  Code Status, Allergies, Insulin Sliding Scale Code Status Info: FULL CODE Allergies Info: Codeine   Insulin Sliding Scale Info: 3x/day 0-15units       Current Medications (12/21/2014): Current Facility-Administered Medications  Medication Dose Route Frequency Provider Last Rate Last Dose  . acetaminophen (TYLENOL) tablet 650 mg  650 mg Oral Q6H PRN Melton Alar, PA-C   650 mg at 12/19/14 1805  . albuterol (PROVENTIL) (2.5 MG/3ML) 0.083% nebulizer solution 2.5 mg  2.5 mg Nebulization Q6H PRN Ripudeep K Rai, MD      . amLODipine (NORVASC) tablet 10 mg  10 mg Oral Daily Melton Alar, PA-C   10 mg at 12/21/14 0935  . antiseptic oral rinse (CPC / CETYLPYRIDINIUM CHLORIDE 0.05%) solution 7 mL  7 mL Mouth Rinse BID Caren Griffins, MD   7 mL at 12/21/14 0936  . aspirin chewable tablet 81 mg  81 mg Oral Daily Rogelia Mire, NP   81 mg at 12/21/14 0931  .  bisacodyl (DULCOLAX) EC tablet 10 mg  10 mg Oral q morning - 10a Thurnell Lose, MD   10 mg at 12/20/14 1036  . calcitRIOL (ROCALTROL) capsule 0.25 mcg  0.25 mcg Oral Daily Mauricia Area, MD   0.25 mcg at 12/21/14 0933  . calcium acetate (PHOSLO) capsule 667 mg  667 mg Oral TID WC Mauricia Area, MD   667 mg at 12/21/14 0826  . carbamazepine (TEGRETOL) tablet 400 mg  400 mg Oral BID Melton Alar, PA-C   400 mg at 12/21/14 0934  . citalopram (CELEXA) tablet 20 mg  20 mg Oral Daily Melton Alar, PA-C   20 mg at 12/21/14 [id number]  .  clobetasol cream (TEMOVATE) AB-123456789 % 1 application  1 application Topical BID PRN Melton Alar, PA-C      . [START ON 12/24/2014] Darbepoetin Alfa (ARANESP) injection 150 mcg  150 mcg Subcutaneous Q Tue-1800 Mauricia Area, MD      . diphenhydrAMINE (BENADRYL) injection 25 mg  25 mg Intravenous Q6H PRN Thurnell Lose, MD      . docusate sodium (COLACE) capsule 200 mg  200 mg Oral BID Thurnell Lose, MD   200 mg at 12/20/14 2227  . enoxaparin (LOVENOX) injection 100 mg  100 mg Subcutaneous Q24H Ripudeep K Rai, MD   100 mg at 12/21/14 1230  . ezetimibe-simvastatin (VYTORIN) 10-40 MG per tablet 1 tablet  1 tablet Oral QHS Melton Alar, PA-C   1 tablet at 12/20/14 1045  . furosemide (LASIX) tablet 160 mg  160 mg Oral BID Mauricia Area, MD   160 mg at 12/21/14 0826  . hydrALAZINE (APRESOLINE) injection 10 mg  10 mg Intravenous Q6H PRN Thurnell Lose, MD   10 mg at 12/20/14 0357  . hydrALAZINE (APRESOLINE) tablet 100 mg  100 mg Oral 3 times per day Thurnell Lose, MD   100 mg at 12/21/14 0556  . insulin aspart (novoLOG) injection 0-15 Units  0-15 Units Subcutaneous TID WC Melton Alar, PA-C   2 Units at 12/20/14 1800  . insulin aspart (novoLOG) injection 4 Units  4 Units Subcutaneous TID WC Melton Alar, PA-C   4 Units at 12/18/14 1300  . insulin glargine (LANTUS) injection 15 Units  15 Units Subcutaneous QHS Melton Alar, PA-C   15 Units at 12/20/14 2226  . lacosamide (VIMPAT) tablet 100 mg  100 mg Oral BID Caren Griffins, MD   100 mg at 12/21/14 0934  . metoprolol (LOPRESSOR) injection 5 mg  5 mg Intravenous Q4H PRN Thurnell Lose, MD   5 mg at 12/16/14 1713  . nitroGLYCERIN (NITROGLYN) 2 % ointment 0.5 inch  0.5 inch Topical 4 times per day Thurnell Lose, MD   0.5 inch at 12/21/14 1247  . ondansetron (ZOFRAN) injection 4 mg  4 mg Intravenous Q6H PRN Caren Griffins, MD   4 mg at 12/21/14 0422  . oxyCODONE-acetaminophen (PERCOCET/ROXICET) 5-325 MG per tablet 1 tablet  1  tablet Oral Q4H PRN Thurnell Lose, MD   1 tablet at 12/21/14 0931  . pantoprazole (PROTONIX) EC tablet 40 mg  40 mg Oral Daily Melton Alar, PA-C   40 mg at 12/21/14 0935  . potassium chloride SA (K-DUR,KLOR-CON) CR tablet 40 mEq  40 mEq Oral Once Ripudeep K Rai, MD      . senna-docusate (Senokot-S) tablet 1 tablet  1 tablet Oral QHS PRN Melton Alar, PA-C      .  sodium bicarbonate tablet 1,300 mg  1,300 mg Oral BID Mauricia Area, MD   1,300 mg at 12/21/14 0932  . warfarin (COUMADIN) tablet 10 mg  10 mg Oral ONCE-1800 Ripudeep Krystal Eaton, MD      . Warfarin - Pharmacist Dosing Inpatient   Does not apply q1800 Almeta Monas Bajbus, RPH   0  at 12/20/14 1749   Do not use this list as official medication orders. Please verify with discharge summary.  Discharge Medications:   Medication List    STOP taking these medications        ciprofloxacin 500 MG tablet  Commonly known as:  CIPRO     cloNIDine 0.2 MG tablet  Commonly known as:  CATAPRES     metoprolol succinate 50 MG 24 hr tablet  Commonly known as:  TOPROL-XL      TAKE these medications        albuterol (2.5 MG/3ML) 0.083% nebulizer solution  Commonly known as:  PROVENTIL  Take 3 mLs (2.5 mg total) by nebulization every 6 (six) hours as needed for wheezing or shortness of breath.     amLODipine 10 MG tablet  Commonly known as:  NORVASC  Take 10 mg by mouth daily.     aspirin 81 MG chewable tablet  Chew 1 tablet (81 mg total) by mouth daily.     bisacodyl 5 MG EC tablet  Commonly known as:  DULCOLAX  Take 2 tablets (10 mg total) by mouth every morning.     calcitRIOL 0.25 MCG capsule  Commonly known as:  ROCALTROL  Take 1 capsule (0.25 mcg total) by mouth daily.     calcium acetate 667 MG capsule  Commonly known as:  PHOSLO  Take 1 capsule (667 mg total) by mouth 3 (three) times daily with meals.     carbamazepine 200 MG tablet  Commonly known as:  TEGRETOL  Take 400 mg by mouth 2 (two) times daily.      citalopram 20 MG tablet  Commonly known as:  CELEXA  Take 20 mg by mouth daily.     clobetasol cream 0.05 %  Commonly known as:  TEMOVATE  Apply 1 application topically 2 (two) times daily as needed (for legs).     docusate sodium 100 MG capsule  Commonly known as:  COLACE  Take 2 capsules (200 mg total) by mouth 2 (two) times daily.     enoxaparin 100 MG/ML injection  Commonly known as:  LOVENOX  Inject 1 mL (100 mg total) into the skin daily. UNTIL INR above 2, then stop lovenox     ezetimibe-simvastatin 10-40 MG tablet  Commonly known as:  VYTORIN  Take 1 tablet by mouth daily.     furosemide 80 MG tablet  Commonly known as:  LASIX  Take 2 tablets (160 mg total) by mouth 2 (two) times daily.     glipiZIDE 5 MG 24 hr tablet  Commonly known as:  GLUCOTROL XL  Take 5 mg by mouth daily with breakfast.     HUMULIN 70/30 KWIKPEN (70-30) 100 UNIT/ML PEN  Generic drug:  Insulin Isophane & Regular Human  Inject 20 Units into the skin 2 (two) times daily.     hydrALAZINE 100 MG tablet  Commonly known as:  APRESOLINE  Take 1 tablet (100 mg total) by mouth every 8 (eight) hours.     omeprazole 40 MG capsule  Commonly known as:  PRILOSEC  Take 40 mg by mouth daily.  oxyCODONE-acetaminophen 5-325 MG tablet  Commonly known as:  PERCOCET/ROXICET  Take 1 tablet by mouth every 6 (six) hours as needed for moderate pain or severe pain.     sodium bicarbonate 650 MG tablet  Take 2 tablets (1,300 mg total) by mouth 2 (two) times daily.     VIMPAT 100 MG Tabs  Generic drug:  Lacosamide  Take 1 tablet by mouth 2 (two) times daily.     VITAMIN C PO  Take 1 tablet by mouth daily.     Vitamin D-3 1000 UNITS Caps  Take 1,000 Units by mouth daily at 12 noon.     warfarin 5 MG tablet  Commonly known as:  COUMADIN  Please take 10 mg (2tabs) today and tomorrow (11/27) evening, then check INR on 11/28. May reduce dose to 7.5 mg (1 and half tab) from 11/29 and adjust according to  PT/INR readings.        Relevant Imaging Results:  Relevant Lab Results:  Recent Labs    Additional Information SS# [id number]  Christene Lye, LCSW

## 2014-12-26 ENCOUNTER — Ambulatory Visit: Payer: Medicare Other

## 2014-12-26 DIAGNOSIS — I4891 Unspecified atrial fibrillation: Secondary | ICD-10-CM | POA: Diagnosis not present

## 2014-12-26 DIAGNOSIS — E119 Type 2 diabetes mellitus without complications: Secondary | ICD-10-CM | POA: Diagnosis not present

## 2014-12-26 DIAGNOSIS — N184 Chronic kidney disease, stage 4 (severe): Secondary | ICD-10-CM | POA: Diagnosis not present

## 2014-12-27 ENCOUNTER — Encounter: Payer: Self-pay | Admitting: Vascular Surgery

## 2014-12-27 ENCOUNTER — Ambulatory Visit: Payer: Medicare Other | Admitting: Podiatry

## 2015-01-03 ENCOUNTER — Encounter: Payer: Medicare Other | Admitting: Vascular Surgery

## 2015-01-03 ENCOUNTER — Telehealth: Payer: Self-pay

## 2015-01-03 NOTE — Telephone Encounter (Signed)
rec'd phone call from pt's nurse at [facility].  Reported pt. Has increased swelling and warmth of left arm, above and below the incision.  Stated the area feels firm, and pt. c/o area being very tender.  Stated there is a small area of incision is opening-up; reported small amt. of bloody drainage.  Reported a venous doppler was done at the nursing home, but the results are not completed yet.  Reported the pt. is on Coumadin.  Noted pt. missed appt. at [facility] this AM.  Discussed with Dr. Scot Dock.  Recommended to apply warm compresses to left arm, unless incision is open.  Advised to elevate above level of heart. Recommended for pt. To be schedule appt. 1st of next week.  Advised that the venous doppler results be addressed by the ordering MD.  Phone call back to [facility]; spoke with [facility]  Advised of Dr. Nicole Cella recommendations.  Advised will have scheduler call and schedule appt. for next week.  Verb. Understanding.

## 2015-01-06 DIAGNOSIS — S41102S Unspecified open wound of left upper arm, sequela: Secondary | ICD-10-CM | POA: Diagnosis not present

## 2015-01-06 DIAGNOSIS — R600 Localized edema: Secondary | ICD-10-CM | POA: Diagnosis not present

## 2015-01-08 ENCOUNTER — Ambulatory Visit (INDEPENDENT_AMBULATORY_CARE_PROVIDER_SITE_OTHER): Payer: Medicare Other | Admitting: Vascular Surgery

## 2015-01-08 ENCOUNTER — Encounter: Payer: Self-pay | Admitting: Vascular Surgery

## 2015-01-08 VITALS — BP 167/50 | HR 81 | Temp 98.5°F | Ht 62.0 in | Wt 237.0 lb

## 2015-01-08 DIAGNOSIS — I742 Embolism and thrombosis of arteries of the upper extremities: Secondary | ICD-10-CM

## 2015-01-08 NOTE — Progress Notes (Signed)
POST OPERATIVE OFFICE NOTE    CC:  F/u for surgery  HPI:  This is a 69 y.o. female who is s/p left brachial embolectomy and vein patch angioplasty using cephalic vein.  She is currently at a SNF for therapy.  She wears a compression sleeve on the left UE and dry dressing changes to the left arm incision.  No fever or chills.    Allergies  Allergen Reactions  . Codeine Nausea And Vomiting    Current Outpatient Prescriptions  Medication Sig Dispense Refill  . albuterol (PROVENTIL) (2.5 MG/3ML) 0.083% nebulizer solution Take 3 mLs (2.5 mg total) by nebulization every 6 (six) hours as needed for wheezing or shortness of breath. 75 mL 12  . amLODipine (NORVASC) 10 MG tablet Take 10 mg by mouth daily.    . Ascorbic Acid (VITAMIN C PO) Take 1 tablet by mouth daily.    [name] aspirin 81 MG chewable tablet Chew 1 tablet (81 mg total) by mouth daily.    . bisacodyl (DULCOLAX) 5 MG EC tablet Take 2 tablets (10 mg total) by mouth every morning. 30 tablet 0  . calcitRIOL (ROCALTROL) 0.25 MCG capsule Take 1 capsule (0.25 mcg total) by mouth daily.    . calcium acetate (PHOSLO) 667 MG capsule Take 1 capsule (667 mg total) by mouth 3 (three) times daily with meals.    . carbamazepine (TEGRETOL) 200 MG tablet Take 400 mg by mouth 2 (two) times daily.    . Cholecalciferol (VITAMIN D-3) 1000 UNITS CAPS Take 1,000 Units by mouth daily at 12 noon.     . citalopram (CELEXA) 20 MG tablet Take 20 mg by mouth daily.    . clobetasol cream (TEMOVATE) AB-123456789 % Apply 1 application topically 2 (two) times daily as needed (for legs).     . docusate sodium (COLACE) 100 MG capsule Take 2 capsules (200 mg total) by mouth 2 (two) times daily. 10 capsule 0  . enoxaparin (LOVENOX) 100 MG/ML injection Inject 1 mL (100 mg total) into the skin daily. UNTIL INR above 2, then stop lovenox 0 Syringe   . ezetimibe-simvastatin (VYTORIN) 10-40 MG per tablet Take 1 tablet by mouth daily.    . furosemide (LASIX) 80 MG tablet Take 2 tablets  (160 mg total) by mouth 2 (two) times daily.    [name] glipiZIDE (GLUCOTROL XL) 5 MG 24 hr tablet Take 5 mg by mouth daily with breakfast.    . HUMULIN 70/30 KWIKPEN (70-30) 100 UNIT/ML PEN Inject 20 Units into the skin 2 (two) times daily.  0  . hydrALAZINE (APRESOLINE) 100 MG tablet Take 1 tablet (100 mg total) by mouth every 8 (eight) hours.    . Lacosamide (VIMPAT) 100 MG TABS Take 1 tablet by mouth 2 (two) times daily.    [name] omeprazole (PRILOSEC) 40 MG capsule Take 40 mg by mouth daily.    [name] oxyCODONE-acetaminophen (PERCOCET/ROXICET) 5-325 MG tablet Take 1 tablet by mouth every 6 (six) hours as needed for moderate pain or severe pain. 30 tablet 0  . sodium bicarbonate 650 MG tablet Take 2 tablets (1,300 mg total) by mouth 2 (two) times daily.    [name] warfarin (COUMADIN) 5 MG tablet Please take 10 mg (2tabs) today and tomorrow (11/27) evening, then check INR on 11/28. May reduce dose to 7.5 mg (1 and half tab) from 11/29 and adjust according to PT/INR readings.     No current facility-administered medications for this visit.     ROS:  See HPI  Physical Exam:  Filed Vitals:   01/08/15 1313 01/08/15 1315  BP: 171/52 167/50  Pulse: 81   Temp: 98.5 F (36.9 C)     Incision:  Healing, good granulation tissue with no drainage. Extremities:  Left UE with palpable 3+ radial pulse.  Sh e has decreased moition of the entire left UE and mod edema.  No ischemic changes to the arm. Sensation is intact  Assessment/Plan:  This is a 69 y.o. female who is s/p: left brachial embolectomy and vein patch angioplasty using cephalic vein  Elevation, dry dressing to incision with bacitracin ointment. Continue the compression sleeve.  She needs to elevate her left UE above the level of her heart.  This was demonstrated in the office today. F/U with Dr. Scot Dock in 3 weeks.    Theda Sers Stanislaus Kaltenbach [facility] PA-C [facility] [phone]  Clinic MD:  Pt seen and examined with Dr. Scot Dock

## 2015-01-13 DIAGNOSIS — Z87440 Personal history of urinary (tract) infections: Secondary | ICD-10-CM | POA: Diagnosis not present

## 2015-01-13 DIAGNOSIS — G4089 Other seizures: Secondary | ICD-10-CM | POA: Diagnosis not present

## 2015-01-13 DIAGNOSIS — M6281 Muscle weakness (generalized): Secondary | ICD-10-CM | POA: Diagnosis not present

## 2015-01-13 DIAGNOSIS — I130 Hypertensive heart and chronic kidney disease with heart failure and stage 1 through stage 4 chronic kidney disease, or unspecified chronic kidney disease: Secondary | ICD-10-CM | POA: Diagnosis not present

## 2015-01-13 DIAGNOSIS — D631 Anemia in chronic kidney disease: Secondary | ICD-10-CM | POA: Diagnosis not present

## 2015-01-13 DIAGNOSIS — N184 Chronic kidney disease, stage 4 (severe): Secondary | ICD-10-CM | POA: Diagnosis not present

## 2015-01-13 DIAGNOSIS — I5032 Chronic diastolic (congestive) heart failure: Secondary | ICD-10-CM | POA: Diagnosis not present

## 2015-01-13 DIAGNOSIS — Z794 Long term (current) use of insulin: Secondary | ICD-10-CM | POA: Diagnosis not present

## 2015-01-13 DIAGNOSIS — Z7982 Long term (current) use of aspirin: Secondary | ICD-10-CM | POA: Diagnosis not present

## 2015-01-13 DIAGNOSIS — F339 Major depressive disorder, recurrent, unspecified: Secondary | ICD-10-CM | POA: Diagnosis not present

## 2015-01-13 DIAGNOSIS — Z5181 Encounter for therapeutic drug level monitoring: Secondary | ICD-10-CM | POA: Diagnosis not present

## 2015-01-13 DIAGNOSIS — G4733 Obstructive sleep apnea (adult) (pediatric): Secondary | ICD-10-CM | POA: Diagnosis not present

## 2015-01-13 DIAGNOSIS — I482 Chronic atrial fibrillation: Secondary | ICD-10-CM | POA: Diagnosis not present

## 2015-01-13 DIAGNOSIS — K219 Gastro-esophageal reflux disease without esophagitis: Secondary | ICD-10-CM | POA: Diagnosis not present

## 2015-01-13 DIAGNOSIS — Z48812 Encounter for surgical aftercare following surgery on the circulatory system: Secondary | ICD-10-CM | POA: Diagnosis not present

## 2015-01-13 DIAGNOSIS — R32 Unspecified urinary incontinence: Secondary | ICD-10-CM | POA: Diagnosis not present

## 2015-01-13 DIAGNOSIS — E1122 Type 2 diabetes mellitus with diabetic chronic kidney disease: Secondary | ICD-10-CM | POA: Diagnosis not present

## 2015-01-14 DIAGNOSIS — Z48812 Encounter for surgical aftercare following surgery on the circulatory system: Secondary | ICD-10-CM | POA: Diagnosis not present

## 2015-01-14 DIAGNOSIS — Z5181 Encounter for therapeutic drug level monitoring: Secondary | ICD-10-CM | POA: Diagnosis not present

## 2015-01-14 DIAGNOSIS — G4733 Obstructive sleep apnea (adult) (pediatric): Secondary | ICD-10-CM | POA: Diagnosis not present

## 2015-01-14 DIAGNOSIS — I5032 Chronic diastolic (congestive) heart failure: Secondary | ICD-10-CM | POA: Diagnosis not present

## 2015-01-14 DIAGNOSIS — D631 Anemia in chronic kidney disease: Secondary | ICD-10-CM | POA: Diagnosis not present

## 2015-01-14 DIAGNOSIS — G4089 Other seizures: Secondary | ICD-10-CM | POA: Diagnosis not present

## 2015-01-14 DIAGNOSIS — I130 Hypertensive heart and chronic kidney disease with heart failure and stage 1 through stage 4 chronic kidney disease, or unspecified chronic kidney disease: Secondary | ICD-10-CM | POA: Diagnosis not present

## 2015-01-14 DIAGNOSIS — E1122 Type 2 diabetes mellitus with diabetic chronic kidney disease: Secondary | ICD-10-CM | POA: Diagnosis not present

## 2015-01-14 DIAGNOSIS — Z794 Long term (current) use of insulin: Secondary | ICD-10-CM | POA: Diagnosis not present

## 2015-01-14 DIAGNOSIS — I482 Chronic atrial fibrillation: Secondary | ICD-10-CM | POA: Diagnosis not present

## 2015-01-14 DIAGNOSIS — M6281 Muscle weakness (generalized): Secondary | ICD-10-CM | POA: Diagnosis not present

## 2015-01-14 DIAGNOSIS — R32 Unspecified urinary incontinence: Secondary | ICD-10-CM | POA: Diagnosis not present

## 2015-01-14 DIAGNOSIS — Z87440 Personal history of urinary (tract) infections: Secondary | ICD-10-CM | POA: Diagnosis not present

## 2015-01-14 DIAGNOSIS — F339 Major depressive disorder, recurrent, unspecified: Secondary | ICD-10-CM | POA: Diagnosis not present

## 2015-01-14 DIAGNOSIS — K219 Gastro-esophageal reflux disease without esophagitis: Secondary | ICD-10-CM | POA: Diagnosis not present

## 2015-01-14 DIAGNOSIS — N184 Chronic kidney disease, stage 4 (severe): Secondary | ICD-10-CM | POA: Diagnosis not present

## 2015-01-14 DIAGNOSIS — Z7982 Long term (current) use of aspirin: Secondary | ICD-10-CM | POA: Diagnosis not present

## 2015-01-15 DIAGNOSIS — E1122 Type 2 diabetes mellitus with diabetic chronic kidney disease: Secondary | ICD-10-CM | POA: Diagnosis not present

## 2015-01-15 DIAGNOSIS — Z7982 Long term (current) use of aspirin: Secondary | ICD-10-CM | POA: Diagnosis not present

## 2015-01-15 DIAGNOSIS — Z5181 Encounter for therapeutic drug level monitoring: Secondary | ICD-10-CM | POA: Diagnosis not present

## 2015-01-15 DIAGNOSIS — Z87440 Personal history of urinary (tract) infections: Secondary | ICD-10-CM | POA: Diagnosis not present

## 2015-01-15 DIAGNOSIS — Z48812 Encounter for surgical aftercare following surgery on the circulatory system: Secondary | ICD-10-CM | POA: Diagnosis not present

## 2015-01-15 DIAGNOSIS — R32 Unspecified urinary incontinence: Secondary | ICD-10-CM | POA: Diagnosis not present

## 2015-01-15 DIAGNOSIS — M6281 Muscle weakness (generalized): Secondary | ICD-10-CM | POA: Diagnosis not present

## 2015-01-15 DIAGNOSIS — I5032 Chronic diastolic (congestive) heart failure: Secondary | ICD-10-CM | POA: Diagnosis not present

## 2015-01-15 DIAGNOSIS — N184 Chronic kidney disease, stage 4 (severe): Secondary | ICD-10-CM | POA: Diagnosis not present

## 2015-01-15 DIAGNOSIS — Z794 Long term (current) use of insulin: Secondary | ICD-10-CM | POA: Diagnosis not present

## 2015-01-15 DIAGNOSIS — K219 Gastro-esophageal reflux disease without esophagitis: Secondary | ICD-10-CM | POA: Diagnosis not present

## 2015-01-15 DIAGNOSIS — F339 Major depressive disorder, recurrent, unspecified: Secondary | ICD-10-CM | POA: Diagnosis not present

## 2015-01-15 DIAGNOSIS — D631 Anemia in chronic kidney disease: Secondary | ICD-10-CM | POA: Diagnosis not present

## 2015-01-15 DIAGNOSIS — G4089 Other seizures: Secondary | ICD-10-CM | POA: Diagnosis not present

## 2015-01-15 DIAGNOSIS — I482 Chronic atrial fibrillation: Secondary | ICD-10-CM | POA: Diagnosis not present

## 2015-01-15 DIAGNOSIS — G4733 Obstructive sleep apnea (adult) (pediatric): Secondary | ICD-10-CM | POA: Diagnosis not present

## 2015-01-15 DIAGNOSIS — I130 Hypertensive heart and chronic kidney disease with heart failure and stage 1 through stage 4 chronic kidney disease, or unspecified chronic kidney disease: Secondary | ICD-10-CM | POA: Diagnosis not present

## 2015-01-20 ENCOUNTER — Ambulatory Visit (INDEPENDENT_AMBULATORY_CARE_PROVIDER_SITE_OTHER): Payer: Medicare Other | Admitting: Podiatry

## 2015-01-20 ENCOUNTER — Encounter: Payer: Self-pay | Admitting: Podiatry

## 2015-01-20 DIAGNOSIS — M79674 Pain in right toe(s): Secondary | ICD-10-CM

## 2015-01-20 DIAGNOSIS — M79675 Pain in left toe(s): Secondary | ICD-10-CM | POA: Diagnosis not present

## 2015-01-20 DIAGNOSIS — B351 Tinea unguium: Secondary | ICD-10-CM

## 2015-01-20 NOTE — Progress Notes (Signed)
Patient ID: [name], female   DOB: 16-Sep-1945, 69 y.o.   MRN: [id number] Complaint:  Visit Type: Patient returns to my office for continued preventative foot care services. Complaint: Patient states" my nails have grown long and thick and become painful to walk and wear shoes" Patient has been diagnosed with DM with no complications. He presents for preventative foot care services. No changes to ROS  Podiatric Exam: Vascular: dorsalis pedis and posterior tibial pulses are palpable bilateral. Capillary return is immediate. Temperature gradient is WNL. Skin turgor WNL  Sensorium: Normal Semmes Weinstein monofilament test. Normal tactile sensation bilaterally. Nail Exam: Pt has thick disfigured discolored nails with subungual debris noted bilateral entire nail hallux through fifth toenails Ulcer Exam: There is no evidence of ulcer or pre-ulcerative changes or infection. Orthopedic Exam: Muscle tone and strength are WNL. No limitations in general ROM. No crepitus or effusions noted. Foot type and digits show no abnormalities. Bony prominences are unremarkable. Skin: No Porokeratosis. No infection or ulcers. Xerotic skin plantar B/L  Diagnosis:  Tinea unguium, Pain in right toe, pain in left toes  Treatment & Plan Procedures and Treatment: Consent by patient was obtained for treatment procedures. The patient understood the discussion of treatment and procedures well. All questions were answered thoroughly reviewed. Debridement of mycotic and hypertrophic toenails, 1 through 5 bilateral and clearing of subungual debris. No ulceration, no infection noted.  Return Visit-Office Procedure: Patient instructed to return to the office for a follow up visit 3 months for continued evaluation and treatment.   Gardiner Barefoot DPM

## 2015-01-21 ENCOUNTER — Encounter: Payer: Self-pay | Admitting: Vascular Surgery

## 2015-01-21 DIAGNOSIS — F339 Major depressive disorder, recurrent, unspecified: Secondary | ICD-10-CM | POA: Diagnosis not present

## 2015-01-21 DIAGNOSIS — Z87440 Personal history of urinary (tract) infections: Secondary | ICD-10-CM | POA: Diagnosis not present

## 2015-01-21 DIAGNOSIS — G4089 Other seizures: Secondary | ICD-10-CM | POA: Diagnosis not present

## 2015-01-21 DIAGNOSIS — M6281 Muscle weakness (generalized): Secondary | ICD-10-CM | POA: Diagnosis not present

## 2015-01-21 DIAGNOSIS — I5032 Chronic diastolic (congestive) heart failure: Secondary | ICD-10-CM | POA: Diagnosis not present

## 2015-01-21 DIAGNOSIS — Z7982 Long term (current) use of aspirin: Secondary | ICD-10-CM | POA: Diagnosis not present

## 2015-01-21 DIAGNOSIS — N184 Chronic kidney disease, stage 4 (severe): Secondary | ICD-10-CM | POA: Diagnosis not present

## 2015-01-21 DIAGNOSIS — Z48812 Encounter for surgical aftercare following surgery on the circulatory system: Secondary | ICD-10-CM | POA: Diagnosis not present

## 2015-01-21 DIAGNOSIS — Z5181 Encounter for therapeutic drug level monitoring: Secondary | ICD-10-CM | POA: Diagnosis not present

## 2015-01-21 DIAGNOSIS — Z794 Long term (current) use of insulin: Secondary | ICD-10-CM | POA: Diagnosis not present

## 2015-01-21 DIAGNOSIS — I482 Chronic atrial fibrillation: Secondary | ICD-10-CM | POA: Diagnosis not present

## 2015-01-21 DIAGNOSIS — K219 Gastro-esophageal reflux disease without esophagitis: Secondary | ICD-10-CM | POA: Diagnosis not present

## 2015-01-21 DIAGNOSIS — R32 Unspecified urinary incontinence: Secondary | ICD-10-CM | POA: Diagnosis not present

## 2015-01-21 DIAGNOSIS — I130 Hypertensive heart and chronic kidney disease with heart failure and stage 1 through stage 4 chronic kidney disease, or unspecified chronic kidney disease: Secondary | ICD-10-CM | POA: Diagnosis not present

## 2015-01-21 DIAGNOSIS — E1122 Type 2 diabetes mellitus with diabetic chronic kidney disease: Secondary | ICD-10-CM | POA: Diagnosis not present

## 2015-01-21 DIAGNOSIS — D631 Anemia in chronic kidney disease: Secondary | ICD-10-CM | POA: Diagnosis not present

## 2015-01-21 DIAGNOSIS — G4733 Obstructive sleep apnea (adult) (pediatric): Secondary | ICD-10-CM | POA: Diagnosis not present

## 2015-01-22 DIAGNOSIS — M6281 Muscle weakness (generalized): Secondary | ICD-10-CM | POA: Diagnosis not present

## 2015-01-22 DIAGNOSIS — N184 Chronic kidney disease, stage 4 (severe): Secondary | ICD-10-CM | POA: Diagnosis not present

## 2015-01-22 DIAGNOSIS — Z87440 Personal history of urinary (tract) infections: Secondary | ICD-10-CM | POA: Diagnosis not present

## 2015-01-22 DIAGNOSIS — I130 Hypertensive heart and chronic kidney disease with heart failure and stage 1 through stage 4 chronic kidney disease, or unspecified chronic kidney disease: Secondary | ICD-10-CM | POA: Diagnosis not present

## 2015-01-22 DIAGNOSIS — Z7982 Long term (current) use of aspirin: Secondary | ICD-10-CM | POA: Diagnosis not present

## 2015-01-22 DIAGNOSIS — Z794 Long term (current) use of insulin: Secondary | ICD-10-CM | POA: Diagnosis not present

## 2015-01-22 DIAGNOSIS — D631 Anemia in chronic kidney disease: Secondary | ICD-10-CM | POA: Diagnosis not present

## 2015-01-22 DIAGNOSIS — K219 Gastro-esophageal reflux disease without esophagitis: Secondary | ICD-10-CM | POA: Diagnosis not present

## 2015-01-22 DIAGNOSIS — Z5181 Encounter for therapeutic drug level monitoring: Secondary | ICD-10-CM | POA: Diagnosis not present

## 2015-01-22 DIAGNOSIS — I482 Chronic atrial fibrillation: Secondary | ICD-10-CM | POA: Diagnosis not present

## 2015-01-22 DIAGNOSIS — E1122 Type 2 diabetes mellitus with diabetic chronic kidney disease: Secondary | ICD-10-CM | POA: Diagnosis not present

## 2015-01-22 DIAGNOSIS — F339 Major depressive disorder, recurrent, unspecified: Secondary | ICD-10-CM | POA: Diagnosis not present

## 2015-01-22 DIAGNOSIS — R32 Unspecified urinary incontinence: Secondary | ICD-10-CM | POA: Diagnosis not present

## 2015-01-22 DIAGNOSIS — Z48812 Encounter for surgical aftercare following surgery on the circulatory system: Secondary | ICD-10-CM | POA: Diagnosis not present

## 2015-01-22 DIAGNOSIS — I5032 Chronic diastolic (congestive) heart failure: Secondary | ICD-10-CM | POA: Diagnosis not present

## 2015-01-22 DIAGNOSIS — G4733 Obstructive sleep apnea (adult) (pediatric): Secondary | ICD-10-CM | POA: Diagnosis not present

## 2015-01-22 DIAGNOSIS — G4089 Other seizures: Secondary | ICD-10-CM | POA: Diagnosis not present

## 2015-01-23 DIAGNOSIS — G4733 Obstructive sleep apnea (adult) (pediatric): Secondary | ICD-10-CM | POA: Diagnosis not present

## 2015-01-23 DIAGNOSIS — Z794 Long term (current) use of insulin: Secondary | ICD-10-CM | POA: Diagnosis not present

## 2015-01-23 DIAGNOSIS — M6281 Muscle weakness (generalized): Secondary | ICD-10-CM | POA: Diagnosis not present

## 2015-01-23 DIAGNOSIS — G4089 Other seizures: Secondary | ICD-10-CM | POA: Diagnosis not present

## 2015-01-23 DIAGNOSIS — R32 Unspecified urinary incontinence: Secondary | ICD-10-CM | POA: Diagnosis not present

## 2015-01-23 DIAGNOSIS — E1122 Type 2 diabetes mellitus with diabetic chronic kidney disease: Secondary | ICD-10-CM | POA: Diagnosis not present

## 2015-01-23 DIAGNOSIS — N184 Chronic kidney disease, stage 4 (severe): Secondary | ICD-10-CM | POA: Diagnosis not present

## 2015-01-23 DIAGNOSIS — Z48812 Encounter for surgical aftercare following surgery on the circulatory system: Secondary | ICD-10-CM | POA: Diagnosis not present

## 2015-01-23 DIAGNOSIS — D631 Anemia in chronic kidney disease: Secondary | ICD-10-CM | POA: Diagnosis not present

## 2015-01-23 DIAGNOSIS — F339 Major depressive disorder, recurrent, unspecified: Secondary | ICD-10-CM | POA: Diagnosis not present

## 2015-01-23 DIAGNOSIS — Z5181 Encounter for therapeutic drug level monitoring: Secondary | ICD-10-CM | POA: Diagnosis not present

## 2015-01-23 DIAGNOSIS — I130 Hypertensive heart and chronic kidney disease with heart failure and stage 1 through stage 4 chronic kidney disease, or unspecified chronic kidney disease: Secondary | ICD-10-CM | POA: Diagnosis not present

## 2015-01-23 DIAGNOSIS — K219 Gastro-esophageal reflux disease without esophagitis: Secondary | ICD-10-CM | POA: Diagnosis not present

## 2015-01-23 DIAGNOSIS — I5032 Chronic diastolic (congestive) heart failure: Secondary | ICD-10-CM | POA: Diagnosis not present

## 2015-01-23 DIAGNOSIS — I482 Chronic atrial fibrillation: Secondary | ICD-10-CM | POA: Diagnosis not present

## 2015-01-23 DIAGNOSIS — Z87440 Personal history of urinary (tract) infections: Secondary | ICD-10-CM | POA: Diagnosis not present

## 2015-01-23 DIAGNOSIS — Z7982 Long term (current) use of aspirin: Secondary | ICD-10-CM | POA: Diagnosis not present

## 2015-01-28 DIAGNOSIS — I130 Hypertensive heart and chronic kidney disease with heart failure and stage 1 through stage 4 chronic kidney disease, or unspecified chronic kidney disease: Secondary | ICD-10-CM | POA: Diagnosis not present

## 2015-01-28 DIAGNOSIS — N184 Chronic kidney disease, stage 4 (severe): Secondary | ICD-10-CM | POA: Diagnosis not present

## 2015-01-28 DIAGNOSIS — Z5181 Encounter for therapeutic drug level monitoring: Secondary | ICD-10-CM | POA: Diagnosis not present

## 2015-01-28 DIAGNOSIS — I5032 Chronic diastolic (congestive) heart failure: Secondary | ICD-10-CM | POA: Diagnosis not present

## 2015-01-28 DIAGNOSIS — K219 Gastro-esophageal reflux disease without esophagitis: Secondary | ICD-10-CM | POA: Diagnosis not present

## 2015-01-28 DIAGNOSIS — Z87440 Personal history of urinary (tract) infections: Secondary | ICD-10-CM | POA: Diagnosis not present

## 2015-01-28 DIAGNOSIS — M6281 Muscle weakness (generalized): Secondary | ICD-10-CM | POA: Diagnosis not present

## 2015-01-28 DIAGNOSIS — Z48812 Encounter for surgical aftercare following surgery on the circulatory system: Secondary | ICD-10-CM | POA: Diagnosis not present

## 2015-01-28 DIAGNOSIS — D631 Anemia in chronic kidney disease: Secondary | ICD-10-CM | POA: Diagnosis not present

## 2015-01-28 DIAGNOSIS — E1122 Type 2 diabetes mellitus with diabetic chronic kidney disease: Secondary | ICD-10-CM | POA: Diagnosis not present

## 2015-01-28 DIAGNOSIS — G4733 Obstructive sleep apnea (adult) (pediatric): Secondary | ICD-10-CM | POA: Diagnosis not present

## 2015-01-28 DIAGNOSIS — Z7982 Long term (current) use of aspirin: Secondary | ICD-10-CM | POA: Diagnosis not present

## 2015-01-28 DIAGNOSIS — Z794 Long term (current) use of insulin: Secondary | ICD-10-CM | POA: Diagnosis not present

## 2015-01-28 DIAGNOSIS — I482 Chronic atrial fibrillation: Secondary | ICD-10-CM | POA: Diagnosis not present

## 2015-01-29 ENCOUNTER — Encounter: Payer: Self-pay | Admitting: Vascular Surgery

## 2015-01-29 ENCOUNTER — Ambulatory Visit (INDEPENDENT_AMBULATORY_CARE_PROVIDER_SITE_OTHER): Payer: Medicare Other | Admitting: Vascular Surgery

## 2015-01-29 VITALS — BP 159/60 | HR 84 | Temp 98.7°F | Resp 16 | Ht 62.0 in | Wt 240.0 lb

## 2015-01-29 DIAGNOSIS — Z48812 Encounter for surgical aftercare following surgery on the circulatory system: Secondary | ICD-10-CM

## 2015-01-29 NOTE — Progress Notes (Signed)
Patient name: [name] MRN: [id number] DOB: [birth year] Sex: female  REASON FOR VISIT: Follow up after left brachial embolectomy  HPI: [name] is a 70 y.o. female we developed the sudden onset of numbness in her left upper extremity. She was taken to the operating room on 12/15/2014 and underwent a left brachial embolectomy with vein patch angioplasty using cephalic vein. She had a left radial pulse postoperatively. Did have moderate hematoma at the incision. She comes in for her first outpatient visit. She has had some pain in the hand but this has improved. She previously had issues with mobility of her left arm and this is not new.  She is on aspirin. She is on Coumadin.   Current Outpatient Prescriptions  Medication Sig Dispense Refill  . albuterol (PROVENTIL) (2.5 MG/3ML) 0.083% nebulizer solution Take 3 mLs (2.5 mg total) by nebulization every 6 (six) hours as needed for wheezing or shortness of breath. 75 mL 12  . amLODipine (NORVASC) 10 MG tablet Take 10 mg by mouth daily.    . Ascorbic Acid (VITAMIN C PO) Take 1 tablet by mouth daily.    [name] aspirin 81 MG chewable tablet Chew 1 tablet (81 mg total) by mouth daily.    . bisacodyl (DULCOLAX) 5 MG EC tablet Take 2 tablets (10 mg total) by mouth every morning. 30 tablet 0  . calcitRIOL (ROCALTROL) 0.25 MCG capsule Take 1 capsule (0.25 mcg total) by mouth daily.    . calcium acetate (PHOSLO) 667 MG capsule Take 1 capsule (667 mg total) by mouth 3 (three) times daily with meals.    . carbamazepine (TEGRETOL) 200 MG tablet Take 400 mg by mouth 2 (two) times daily.    . Cholecalciferol (VITAMIN D-3) 1000 UNITS CAPS Take 1,000 Units by mouth daily at 12 noon.     . citalopram (CELEXA) 20 MG tablet Take 20 mg by mouth daily.    . clobetasol cream (TEMOVATE) AB-123456789 % Apply 1 application topically 2 (two) times daily as needed (for legs).     . docusate sodium (COLACE) 100 MG capsule Take 2 capsules (200 mg total) by mouth 2 (two) times  daily. 10 capsule 0  . enoxaparin (LOVENOX) 100 MG/ML injection Inject 1 mL (100 mg total) into the skin daily. UNTIL INR above 2, then stop lovenox 0 Syringe   . ezetimibe-simvastatin (VYTORIN) 10-40 MG per tablet Take 1 tablet by mouth daily.    . furosemide (LASIX) 80 MG tablet Take 2 tablets (160 mg total) by mouth 2 (two) times daily.    [name] glipiZIDE (GLUCOTROL XL) 5 MG 24 hr tablet Take 5 mg by mouth daily with breakfast.    . HUMULIN 70/30 KWIKPEN (70-30) 100 UNIT/ML PEN Inject 20 Units into the skin 2 (two) times daily.  0  . hydrALAZINE (APRESOLINE) 100 MG tablet Take 1 tablet (100 mg total) by mouth every 8 (eight) hours.    . Lacosamide (VIMPAT) 100 MG TABS Take 1 tablet by mouth 2 (two) times daily.    [name] omeprazole (PRILOSEC) 40 MG capsule Take 40 mg by mouth daily.    [name] oxyCODONE-acetaminophen (PERCOCET/ROXICET) 5-325 MG tablet Take 1 tablet by mouth every 6 (six) hours as needed for moderate pain or severe pain. 30 tablet 0  . sodium bicarbonate 650 MG tablet Take 2 tablets (1,300 mg total) by mouth 2 (two) times daily.    [name] warfarin (COUMADIN) 5 MG tablet Please take 10 mg (2tabs) today and tomorrow (11/27) evening,  then check INR on 11/28. May reduce dose to 7.5 mg (1 and half tab) from 11/29 and adjust according to PT/INR readings.     No current facility-administered medications for this visit.    REVIEW OF SYSTEMS:  [X]  denotes positive finding, [ ]  denotes negative finding Cardiac  Comments:  Chest pain or chest pressure:    Shortness of breath upon exertion:    Short of breath when lying flat:    Irregular heart rhythm:    Constitutional    Fever or chills:      PHYSICAL EXAM: Filed Vitals:   01/29/15 1107 01/29/15 1114  BP: 153/60 159/60  Pulse: 84 84  Temp: 98.7 F (37.1 C)   TempSrc: Oral   Resp: 16   Height: 5\' 2"  (1.575 m)   Weight: 240 lb (108.863 kg)   SpO2: 99%     GENERAL: The patient is a well-nourished female, in no acute distress. The vital  signs are documented above. CARDIOVASCULAR: There is a regular rate and rhythm. PULMONARY: There is good air exchange bilaterally without wheezing or rales. She has a palpable left radial pulse. Her incision is healing nicely. Her hand appears well perfused.  MEDICAL ISSUES: The patient is doing well status post left brachial embolectomy. She has a good pulse in the hand is warm and well-perfused. She is on Coumadin. I will see her back as needed.  HYPERTENSION: The patient's initial blood pressure today was elevated. We repeated this and this was still elevated. We have encouraged the patient to follow up with their primary care physician for management of their blood pressure.   Deitra Mayo [facility] [place]: [phone]

## 2015-01-29 NOTE — Progress Notes (Signed)
Filed Vitals:   01/29/15 1107 01/29/15 1114  BP: 153/60 159/60  Pulse: 84 84  Temp: 98.7 F (37.1 C)   TempSrc: Oral   Resp: 16   Height: 5\' 2"  (1.575 m)   Weight: 240 lb (108.863 kg)   SpO2: 99%

## 2015-01-30 ENCOUNTER — Telehealth: Payer: Self-pay

## 2015-01-30 DIAGNOSIS — D631 Anemia in chronic kidney disease: Secondary | ICD-10-CM | POA: Diagnosis not present

## 2015-01-30 DIAGNOSIS — Z5181 Encounter for therapeutic drug level monitoring: Secondary | ICD-10-CM | POA: Diagnosis not present

## 2015-01-30 DIAGNOSIS — I482 Chronic atrial fibrillation: Secondary | ICD-10-CM | POA: Diagnosis not present

## 2015-01-30 DIAGNOSIS — Z48812 Encounter for surgical aftercare following surgery on the circulatory system: Secondary | ICD-10-CM | POA: Diagnosis not present

## 2015-01-30 DIAGNOSIS — M6281 Muscle weakness (generalized): Secondary | ICD-10-CM | POA: Diagnosis not present

## 2015-01-30 DIAGNOSIS — Z794 Long term (current) use of insulin: Secondary | ICD-10-CM | POA: Diagnosis not present

## 2015-01-30 DIAGNOSIS — Z87440 Personal history of urinary (tract) infections: Secondary | ICD-10-CM | POA: Diagnosis not present

## 2015-01-30 DIAGNOSIS — I130 Hypertensive heart and chronic kidney disease with heart failure and stage 1 through stage 4 chronic kidney disease, or unspecified chronic kidney disease: Secondary | ICD-10-CM | POA: Diagnosis not present

## 2015-01-30 DIAGNOSIS — E1122 Type 2 diabetes mellitus with diabetic chronic kidney disease: Secondary | ICD-10-CM | POA: Diagnosis not present

## 2015-01-30 DIAGNOSIS — N184 Chronic kidney disease, stage 4 (severe): Secondary | ICD-10-CM | POA: Diagnosis not present

## 2015-01-30 DIAGNOSIS — Z7982 Long term (current) use of aspirin: Secondary | ICD-10-CM | POA: Diagnosis not present

## 2015-01-30 DIAGNOSIS — I5032 Chronic diastolic (congestive) heart failure: Secondary | ICD-10-CM | POA: Diagnosis not present

## 2015-01-30 DIAGNOSIS — K219 Gastro-esophageal reflux disease without esophagitis: Secondary | ICD-10-CM | POA: Diagnosis not present

## 2015-01-30 DIAGNOSIS — G4733 Obstructive sleep apnea (adult) (pediatric): Secondary | ICD-10-CM | POA: Diagnosis not present

## 2015-01-30 NOTE — Telephone Encounter (Signed)
Phone call from [facility] RN.  Reported that pt. Was seen in the office 01/29/15, and has stated Dr. Scot Dock told her she does not need to wear the compression sleeve on (L) arm anymore, and doesn't need any further dressing care to incision. Advised that the office note doesn't state anything about the compression sleeve.  Nurse reported the incision is dry and intact.  Will clarify with Dr. Scot Dock and return call.  Agreed.

## 2015-01-30 NOTE — Telephone Encounter (Addendum)
RE: clarificaiton  Received: Today    Angelia Mould, MD  Joline Salt Samar Venneman, RN           No compression sleeve and no dressing.  Thanks  [employer] to [employer], [place] with [employer].  Left voice message with clarification from Dr. Scot Dock re: incision care/ compression sleeve, as noted above.

## 2015-02-04 DIAGNOSIS — E1122 Type 2 diabetes mellitus with diabetic chronic kidney disease: Secondary | ICD-10-CM | POA: Diagnosis not present

## 2015-02-04 DIAGNOSIS — I130 Hypertensive heart and chronic kidney disease with heart failure and stage 1 through stage 4 chronic kidney disease, or unspecified chronic kidney disease: Secondary | ICD-10-CM | POA: Diagnosis not present

## 2015-02-04 DIAGNOSIS — I482 Chronic atrial fibrillation: Secondary | ICD-10-CM | POA: Diagnosis not present

## 2015-02-04 DIAGNOSIS — Z794 Long term (current) use of insulin: Secondary | ICD-10-CM | POA: Diagnosis not present

## 2015-02-04 DIAGNOSIS — D631 Anemia in chronic kidney disease: Secondary | ICD-10-CM | POA: Diagnosis not present

## 2015-02-04 DIAGNOSIS — Z48812 Encounter for surgical aftercare following surgery on the circulatory system: Secondary | ICD-10-CM | POA: Diagnosis not present

## 2015-02-04 DIAGNOSIS — I5032 Chronic diastolic (congestive) heart failure: Secondary | ICD-10-CM | POA: Diagnosis not present

## 2015-02-04 DIAGNOSIS — G4733 Obstructive sleep apnea (adult) (pediatric): Secondary | ICD-10-CM | POA: Diagnosis not present

## 2015-02-04 DIAGNOSIS — Z5181 Encounter for therapeutic drug level monitoring: Secondary | ICD-10-CM | POA: Diagnosis not present

## 2015-02-04 DIAGNOSIS — Z7982 Long term (current) use of aspirin: Secondary | ICD-10-CM | POA: Diagnosis not present

## 2015-02-04 DIAGNOSIS — Z87440 Personal history of urinary (tract) infections: Secondary | ICD-10-CM | POA: Diagnosis not present

## 2015-02-04 DIAGNOSIS — K219 Gastro-esophageal reflux disease without esophagitis: Secondary | ICD-10-CM | POA: Diagnosis not present

## 2015-02-04 DIAGNOSIS — M6281 Muscle weakness (generalized): Secondary | ICD-10-CM | POA: Diagnosis not present

## 2015-02-04 DIAGNOSIS — N184 Chronic kidney disease, stage 4 (severe): Secondary | ICD-10-CM | POA: Diagnosis not present

## 2015-02-05 DIAGNOSIS — I130 Hypertensive heart and chronic kidney disease with heart failure and stage 1 through stage 4 chronic kidney disease, or unspecified chronic kidney disease: Secondary | ICD-10-CM | POA: Diagnosis not present

## 2015-02-05 DIAGNOSIS — K219 Gastro-esophageal reflux disease without esophagitis: Secondary | ICD-10-CM | POA: Diagnosis not present

## 2015-02-05 DIAGNOSIS — Z794 Long term (current) use of insulin: Secondary | ICD-10-CM | POA: Diagnosis not present

## 2015-02-05 DIAGNOSIS — Z48812 Encounter for surgical aftercare following surgery on the circulatory system: Secondary | ICD-10-CM | POA: Diagnosis not present

## 2015-02-05 DIAGNOSIS — D631 Anemia in chronic kidney disease: Secondary | ICD-10-CM | POA: Diagnosis not present

## 2015-02-05 DIAGNOSIS — I5032 Chronic diastolic (congestive) heart failure: Secondary | ICD-10-CM | POA: Diagnosis not present

## 2015-02-05 DIAGNOSIS — N184 Chronic kidney disease, stage 4 (severe): Secondary | ICD-10-CM | POA: Diagnosis not present

## 2015-02-05 DIAGNOSIS — I482 Chronic atrial fibrillation: Secondary | ICD-10-CM | POA: Diagnosis not present

## 2015-02-05 DIAGNOSIS — E1122 Type 2 diabetes mellitus with diabetic chronic kidney disease: Secondary | ICD-10-CM | POA: Diagnosis not present

## 2015-02-05 DIAGNOSIS — M6281 Muscle weakness (generalized): Secondary | ICD-10-CM | POA: Diagnosis not present

## 2015-02-05 DIAGNOSIS — Z5181 Encounter for therapeutic drug level monitoring: Secondary | ICD-10-CM | POA: Diagnosis not present

## 2015-02-05 DIAGNOSIS — Z87440 Personal history of urinary (tract) infections: Secondary | ICD-10-CM | POA: Diagnosis not present

## 2015-02-05 DIAGNOSIS — G4733 Obstructive sleep apnea (adult) (pediatric): Secondary | ICD-10-CM | POA: Diagnosis not present

## 2015-02-05 DIAGNOSIS — Z7982 Long term (current) use of aspirin: Secondary | ICD-10-CM | POA: Diagnosis not present

## 2015-02-06 DIAGNOSIS — I130 Hypertensive heart and chronic kidney disease with heart failure and stage 1 through stage 4 chronic kidney disease, or unspecified chronic kidney disease: Secondary | ICD-10-CM | POA: Diagnosis not present

## 2015-02-06 DIAGNOSIS — E1122 Type 2 diabetes mellitus with diabetic chronic kidney disease: Secondary | ICD-10-CM | POA: Diagnosis not present

## 2015-02-06 DIAGNOSIS — K219 Gastro-esophageal reflux disease without esophagitis: Secondary | ICD-10-CM | POA: Diagnosis not present

## 2015-02-06 DIAGNOSIS — D631 Anemia in chronic kidney disease: Secondary | ICD-10-CM | POA: Diagnosis not present

## 2015-02-06 DIAGNOSIS — Z5181 Encounter for therapeutic drug level monitoring: Secondary | ICD-10-CM | POA: Diagnosis not present

## 2015-02-06 DIAGNOSIS — Z87440 Personal history of urinary (tract) infections: Secondary | ICD-10-CM | POA: Diagnosis not present

## 2015-02-06 DIAGNOSIS — Z794 Long term (current) use of insulin: Secondary | ICD-10-CM | POA: Diagnosis not present

## 2015-02-06 DIAGNOSIS — N184 Chronic kidney disease, stage 4 (severe): Secondary | ICD-10-CM | POA: Diagnosis not present

## 2015-02-06 DIAGNOSIS — Z48812 Encounter for surgical aftercare following surgery on the circulatory system: Secondary | ICD-10-CM | POA: Diagnosis not present

## 2015-02-06 DIAGNOSIS — I482 Chronic atrial fibrillation: Secondary | ICD-10-CM | POA: Diagnosis not present

## 2015-02-06 DIAGNOSIS — I5032 Chronic diastolic (congestive) heart failure: Secondary | ICD-10-CM | POA: Diagnosis not present

## 2015-02-06 DIAGNOSIS — G4733 Obstructive sleep apnea (adult) (pediatric): Secondary | ICD-10-CM | POA: Diagnosis not present

## 2015-02-06 DIAGNOSIS — Z7982 Long term (current) use of aspirin: Secondary | ICD-10-CM | POA: Diagnosis not present

## 2015-02-06 DIAGNOSIS — M6281 Muscle weakness (generalized): Secondary | ICD-10-CM | POA: Diagnosis not present

## 2015-02-07 DIAGNOSIS — E1159 Type 2 diabetes mellitus with other circulatory complications: Secondary | ICD-10-CM | POA: Diagnosis not present

## 2015-02-07 DIAGNOSIS — E785 Hyperlipidemia, unspecified: Secondary | ICD-10-CM | POA: Diagnosis not present

## 2015-02-07 DIAGNOSIS — I10 Essential (primary) hypertension: Secondary | ICD-10-CM | POA: Diagnosis not present

## 2015-02-07 DIAGNOSIS — K219 Gastro-esophageal reflux disease without esophagitis: Secondary | ICD-10-CM | POA: Diagnosis not present

## 2015-02-10 DIAGNOSIS — I5030 Unspecified diastolic (congestive) heart failure: Secondary | ICD-10-CM | POA: Diagnosis not present

## 2015-02-10 DIAGNOSIS — E119 Type 2 diabetes mellitus without complications: Secondary | ICD-10-CM | POA: Diagnosis not present

## 2015-02-10 DIAGNOSIS — G4733 Obstructive sleep apnea (adult) (pediatric): Secondary | ICD-10-CM | POA: Diagnosis not present

## 2015-02-10 DIAGNOSIS — M6281 Muscle weakness (generalized): Secondary | ICD-10-CM | POA: Diagnosis not present

## 2015-02-10 DIAGNOSIS — I4891 Unspecified atrial fibrillation: Secondary | ICD-10-CM | POA: Diagnosis not present

## 2015-02-14 DIAGNOSIS — G4733 Obstructive sleep apnea (adult) (pediatric): Secondary | ICD-10-CM | POA: Diagnosis not present

## 2015-02-14 DIAGNOSIS — Z5181 Encounter for therapeutic drug level monitoring: Secondary | ICD-10-CM | POA: Diagnosis not present

## 2015-02-14 DIAGNOSIS — I5032 Chronic diastolic (congestive) heart failure: Secondary | ICD-10-CM | POA: Diagnosis not present

## 2015-02-14 DIAGNOSIS — Z87440 Personal history of urinary (tract) infections: Secondary | ICD-10-CM | POA: Diagnosis not present

## 2015-02-14 DIAGNOSIS — Z794 Long term (current) use of insulin: Secondary | ICD-10-CM | POA: Diagnosis not present

## 2015-02-14 DIAGNOSIS — E1122 Type 2 diabetes mellitus with diabetic chronic kidney disease: Secondary | ICD-10-CM | POA: Diagnosis not present

## 2015-02-14 DIAGNOSIS — I130 Hypertensive heart and chronic kidney disease with heart failure and stage 1 through stage 4 chronic kidney disease, or unspecified chronic kidney disease: Secondary | ICD-10-CM | POA: Diagnosis not present

## 2015-02-14 DIAGNOSIS — N184 Chronic kidney disease, stage 4 (severe): Secondary | ICD-10-CM | POA: Diagnosis not present

## 2015-02-14 DIAGNOSIS — Z7982 Long term (current) use of aspirin: Secondary | ICD-10-CM | POA: Diagnosis not present

## 2015-02-14 DIAGNOSIS — D631 Anemia in chronic kidney disease: Secondary | ICD-10-CM | POA: Diagnosis not present

## 2015-02-14 DIAGNOSIS — I482 Chronic atrial fibrillation: Secondary | ICD-10-CM | POA: Diagnosis not present

## 2015-02-14 DIAGNOSIS — M6281 Muscle weakness (generalized): Secondary | ICD-10-CM | POA: Diagnosis not present

## 2015-02-14 DIAGNOSIS — Z48812 Encounter for surgical aftercare following surgery on the circulatory system: Secondary | ICD-10-CM | POA: Diagnosis not present

## 2015-02-14 DIAGNOSIS — K219 Gastro-esophageal reflux disease without esophagitis: Secondary | ICD-10-CM | POA: Diagnosis not present

## 2015-02-18 DIAGNOSIS — Z7982 Long term (current) use of aspirin: Secondary | ICD-10-CM | POA: Diagnosis not present

## 2015-02-18 DIAGNOSIS — I482 Chronic atrial fibrillation: Secondary | ICD-10-CM | POA: Diagnosis not present

## 2015-02-18 DIAGNOSIS — Z87440 Personal history of urinary (tract) infections: Secondary | ICD-10-CM | POA: Diagnosis not present

## 2015-02-18 DIAGNOSIS — K219 Gastro-esophageal reflux disease without esophagitis: Secondary | ICD-10-CM | POA: Diagnosis not present

## 2015-02-18 DIAGNOSIS — G4733 Obstructive sleep apnea (adult) (pediatric): Secondary | ICD-10-CM | POA: Diagnosis not present

## 2015-02-18 DIAGNOSIS — D631 Anemia in chronic kidney disease: Secondary | ICD-10-CM | POA: Diagnosis not present

## 2015-02-18 DIAGNOSIS — I5032 Chronic diastolic (congestive) heart failure: Secondary | ICD-10-CM | POA: Diagnosis not present

## 2015-02-18 DIAGNOSIS — I130 Hypertensive heart and chronic kidney disease with heart failure and stage 1 through stage 4 chronic kidney disease, or unspecified chronic kidney disease: Secondary | ICD-10-CM | POA: Diagnosis not present

## 2015-02-18 DIAGNOSIS — N184 Chronic kidney disease, stage 4 (severe): Secondary | ICD-10-CM | POA: Diagnosis not present

## 2015-02-18 DIAGNOSIS — Z794 Long term (current) use of insulin: Secondary | ICD-10-CM | POA: Diagnosis not present

## 2015-02-18 DIAGNOSIS — Z5181 Encounter for therapeutic drug level monitoring: Secondary | ICD-10-CM | POA: Diagnosis not present

## 2015-02-18 DIAGNOSIS — M6281 Muscle weakness (generalized): Secondary | ICD-10-CM | POA: Diagnosis not present

## 2015-02-18 DIAGNOSIS — E1122 Type 2 diabetes mellitus with diabetic chronic kidney disease: Secondary | ICD-10-CM | POA: Diagnosis not present

## 2015-02-18 DIAGNOSIS — Z48812 Encounter for surgical aftercare following surgery on the circulatory system: Secondary | ICD-10-CM | POA: Diagnosis not present

## 2015-02-20 DIAGNOSIS — Z794 Long term (current) use of insulin: Secondary | ICD-10-CM | POA: Diagnosis not present

## 2015-02-20 DIAGNOSIS — N184 Chronic kidney disease, stage 4 (severe): Secondary | ICD-10-CM | POA: Diagnosis not present

## 2015-02-20 DIAGNOSIS — D631 Anemia in chronic kidney disease: Secondary | ICD-10-CM | POA: Diagnosis not present

## 2015-02-20 DIAGNOSIS — G4733 Obstructive sleep apnea (adult) (pediatric): Secondary | ICD-10-CM | POA: Diagnosis not present

## 2015-02-20 DIAGNOSIS — K219 Gastro-esophageal reflux disease without esophagitis: Secondary | ICD-10-CM | POA: Diagnosis not present

## 2015-02-20 DIAGNOSIS — M6281 Muscle weakness (generalized): Secondary | ICD-10-CM | POA: Diagnosis not present

## 2015-02-20 DIAGNOSIS — I482 Chronic atrial fibrillation: Secondary | ICD-10-CM | POA: Diagnosis not present

## 2015-02-20 DIAGNOSIS — Z5181 Encounter for therapeutic drug level monitoring: Secondary | ICD-10-CM | POA: Diagnosis not present

## 2015-02-20 DIAGNOSIS — E1122 Type 2 diabetes mellitus with diabetic chronic kidney disease: Secondary | ICD-10-CM | POA: Diagnosis not present

## 2015-02-20 DIAGNOSIS — I130 Hypertensive heart and chronic kidney disease with heart failure and stage 1 through stage 4 chronic kidney disease, or unspecified chronic kidney disease: Secondary | ICD-10-CM | POA: Diagnosis not present

## 2015-02-20 DIAGNOSIS — Z48812 Encounter for surgical aftercare following surgery on the circulatory system: Secondary | ICD-10-CM | POA: Diagnosis not present

## 2015-02-20 DIAGNOSIS — I5032 Chronic diastolic (congestive) heart failure: Secondary | ICD-10-CM | POA: Diagnosis not present

## 2015-02-20 DIAGNOSIS — Z7982 Long term (current) use of aspirin: Secondary | ICD-10-CM | POA: Diagnosis not present

## 2015-02-20 DIAGNOSIS — Z87440 Personal history of urinary (tract) infections: Secondary | ICD-10-CM | POA: Diagnosis not present

## 2015-02-24 DIAGNOSIS — Z48812 Encounter for surgical aftercare following surgery on the circulatory system: Secondary | ICD-10-CM | POA: Diagnosis not present

## 2015-02-24 DIAGNOSIS — E1122 Type 2 diabetes mellitus with diabetic chronic kidney disease: Secondary | ICD-10-CM | POA: Diagnosis not present

## 2015-02-24 DIAGNOSIS — M6281 Muscle weakness (generalized): Secondary | ICD-10-CM | POA: Diagnosis not present

## 2015-02-24 DIAGNOSIS — G4733 Obstructive sleep apnea (adult) (pediatric): Secondary | ICD-10-CM | POA: Diagnosis not present

## 2015-02-24 DIAGNOSIS — I130 Hypertensive heart and chronic kidney disease with heart failure and stage 1 through stage 4 chronic kidney disease, or unspecified chronic kidney disease: Secondary | ICD-10-CM | POA: Diagnosis not present

## 2015-02-24 DIAGNOSIS — Z87440 Personal history of urinary (tract) infections: Secondary | ICD-10-CM | POA: Diagnosis not present

## 2015-02-24 DIAGNOSIS — N184 Chronic kidney disease, stage 4 (severe): Secondary | ICD-10-CM | POA: Diagnosis not present

## 2015-02-24 DIAGNOSIS — D631 Anemia in chronic kidney disease: Secondary | ICD-10-CM | POA: Diagnosis not present

## 2015-02-24 DIAGNOSIS — K219 Gastro-esophageal reflux disease without esophagitis: Secondary | ICD-10-CM | POA: Diagnosis not present

## 2015-02-24 DIAGNOSIS — Z794 Long term (current) use of insulin: Secondary | ICD-10-CM | POA: Diagnosis not present

## 2015-02-24 DIAGNOSIS — Z5181 Encounter for therapeutic drug level monitoring: Secondary | ICD-10-CM | POA: Diagnosis not present

## 2015-02-24 DIAGNOSIS — Z7982 Long term (current) use of aspirin: Secondary | ICD-10-CM | POA: Diagnosis not present

## 2015-02-24 DIAGNOSIS — I5032 Chronic diastolic (congestive) heart failure: Secondary | ICD-10-CM | POA: Diagnosis not present

## 2015-02-24 DIAGNOSIS — I482 Chronic atrial fibrillation: Secondary | ICD-10-CM | POA: Diagnosis not present

## 2015-02-27 DIAGNOSIS — K219 Gastro-esophageal reflux disease without esophagitis: Secondary | ICD-10-CM | POA: Diagnosis not present

## 2015-02-27 DIAGNOSIS — Z87440 Personal history of urinary (tract) infections: Secondary | ICD-10-CM | POA: Diagnosis not present

## 2015-02-27 DIAGNOSIS — E1122 Type 2 diabetes mellitus with diabetic chronic kidney disease: Secondary | ICD-10-CM | POA: Diagnosis not present

## 2015-02-27 DIAGNOSIS — I482 Chronic atrial fibrillation: Secondary | ICD-10-CM | POA: Diagnosis not present

## 2015-02-27 DIAGNOSIS — I5032 Chronic diastolic (congestive) heart failure: Secondary | ICD-10-CM | POA: Diagnosis not present

## 2015-02-27 DIAGNOSIS — D631 Anemia in chronic kidney disease: Secondary | ICD-10-CM | POA: Diagnosis not present

## 2015-02-27 DIAGNOSIS — Z794 Long term (current) use of insulin: Secondary | ICD-10-CM | POA: Diagnosis not present

## 2015-02-27 DIAGNOSIS — M6281 Muscle weakness (generalized): Secondary | ICD-10-CM | POA: Diagnosis not present

## 2015-02-27 DIAGNOSIS — Z7982 Long term (current) use of aspirin: Secondary | ICD-10-CM | POA: Diagnosis not present

## 2015-02-27 DIAGNOSIS — G4733 Obstructive sleep apnea (adult) (pediatric): Secondary | ICD-10-CM | POA: Diagnosis not present

## 2015-02-27 DIAGNOSIS — N184 Chronic kidney disease, stage 4 (severe): Secondary | ICD-10-CM | POA: Diagnosis not present

## 2015-02-27 DIAGNOSIS — I130 Hypertensive heart and chronic kidney disease with heart failure and stage 1 through stage 4 chronic kidney disease, or unspecified chronic kidney disease: Secondary | ICD-10-CM | POA: Diagnosis not present

## 2015-02-27 DIAGNOSIS — Z5181 Encounter for therapeutic drug level monitoring: Secondary | ICD-10-CM | POA: Diagnosis not present

## 2015-02-27 DIAGNOSIS — Z48812 Encounter for surgical aftercare following surgery on the circulatory system: Secondary | ICD-10-CM | POA: Diagnosis not present

## 2015-03-13 DIAGNOSIS — M6281 Muscle weakness (generalized): Secondary | ICD-10-CM | POA: Diagnosis not present

## 2015-03-13 DIAGNOSIS — I4891 Unspecified atrial fibrillation: Secondary | ICD-10-CM | POA: Diagnosis not present

## 2015-03-13 DIAGNOSIS — G4733 Obstructive sleep apnea (adult) (pediatric): Secondary | ICD-10-CM | POA: Diagnosis not present

## 2015-03-13 DIAGNOSIS — E119 Type 2 diabetes mellitus without complications: Secondary | ICD-10-CM | POA: Diagnosis not present

## 2015-03-13 DIAGNOSIS — I5030 Unspecified diastolic (congestive) heart failure: Secondary | ICD-10-CM | POA: Diagnosis not present

## 2015-03-14 DIAGNOSIS — K219 Gastro-esophageal reflux disease without esophagitis: Secondary | ICD-10-CM | POA: Diagnosis not present

## 2015-03-14 DIAGNOSIS — M79609 Pain in unspecified limb: Secondary | ICD-10-CM | POA: Diagnosis not present

## 2015-03-14 DIAGNOSIS — E785 Hyperlipidemia, unspecified: Secondary | ICD-10-CM | POA: Diagnosis not present

## 2015-03-14 DIAGNOSIS — E1159 Type 2 diabetes mellitus with other circulatory complications: Secondary | ICD-10-CM | POA: Diagnosis not present

## 2015-03-24 DIAGNOSIS — E559 Vitamin D deficiency, unspecified: Secondary | ICD-10-CM | POA: Diagnosis not present

## 2015-03-24 DIAGNOSIS — I10 Essential (primary) hypertension: Secondary | ICD-10-CM | POA: Diagnosis not present

## 2015-04-03 DIAGNOSIS — E559 Vitamin D deficiency, unspecified: Secondary | ICD-10-CM | POA: Diagnosis not present

## 2015-04-03 DIAGNOSIS — I10 Essential (primary) hypertension: Secondary | ICD-10-CM | POA: Diagnosis not present

## 2015-04-10 DIAGNOSIS — I4891 Unspecified atrial fibrillation: Secondary | ICD-10-CM | POA: Diagnosis not present

## 2015-04-10 DIAGNOSIS — I5030 Unspecified diastolic (congestive) heart failure: Secondary | ICD-10-CM | POA: Diagnosis not present

## 2015-04-10 DIAGNOSIS — G4733 Obstructive sleep apnea (adult) (pediatric): Secondary | ICD-10-CM | POA: Diagnosis not present

## 2015-04-10 DIAGNOSIS — E119 Type 2 diabetes mellitus without complications: Secondary | ICD-10-CM | POA: Diagnosis not present

## 2015-04-10 DIAGNOSIS — M6281 Muscle weakness (generalized): Secondary | ICD-10-CM | POA: Diagnosis not present

## 2015-04-15 DIAGNOSIS — E1159 Type 2 diabetes mellitus with other circulatory complications: Secondary | ICD-10-CM | POA: Diagnosis not present

## 2015-04-15 DIAGNOSIS — Z0000 Encounter for general adult medical examination without abnormal findings: Secondary | ICD-10-CM | POA: Diagnosis not present

## 2015-04-15 DIAGNOSIS — E785 Hyperlipidemia, unspecified: Secondary | ICD-10-CM | POA: Diagnosis not present

## 2015-04-15 DIAGNOSIS — I10 Essential (primary) hypertension: Secondary | ICD-10-CM | POA: Diagnosis not present

## 2015-04-25 ENCOUNTER — Ambulatory Visit: Payer: Medicare Other | Admitting: Podiatry

## 2015-05-01 DIAGNOSIS — E559 Vitamin D deficiency, unspecified: Secondary | ICD-10-CM | POA: Diagnosis not present

## 2015-05-01 DIAGNOSIS — I10 Essential (primary) hypertension: Secondary | ICD-10-CM | POA: Diagnosis not present

## 2015-05-11 DIAGNOSIS — G4733 Obstructive sleep apnea (adult) (pediatric): Secondary | ICD-10-CM | POA: Diagnosis not present

## 2015-05-11 DIAGNOSIS — I4891 Unspecified atrial fibrillation: Secondary | ICD-10-CM | POA: Diagnosis not present

## 2015-05-11 DIAGNOSIS — M6281 Muscle weakness (generalized): Secondary | ICD-10-CM | POA: Diagnosis not present

## 2015-05-11 DIAGNOSIS — E119 Type 2 diabetes mellitus without complications: Secondary | ICD-10-CM | POA: Diagnosis not present

## 2015-05-11 DIAGNOSIS — I5030 Unspecified diastolic (congestive) heart failure: Secondary | ICD-10-CM | POA: Diagnosis not present

## 2015-05-20 ENCOUNTER — Ambulatory Visit: Payer: Medicare Other

## 2015-05-26 DIAGNOSIS — I10 Essential (primary) hypertension: Secondary | ICD-10-CM | POA: Diagnosis not present

## 2015-05-26 DIAGNOSIS — E1159 Type 2 diabetes mellitus with other circulatory complications: Secondary | ICD-10-CM | POA: Diagnosis not present

## 2015-05-26 DIAGNOSIS — K219 Gastro-esophageal reflux disease without esophagitis: Secondary | ICD-10-CM | POA: Diagnosis not present

## 2015-05-26 DIAGNOSIS — E785 Hyperlipidemia, unspecified: Secondary | ICD-10-CM | POA: Diagnosis not present

## 2015-06-03 DIAGNOSIS — N189 Chronic kidney disease, unspecified: Secondary | ICD-10-CM | POA: Diagnosis not present

## 2015-06-03 DIAGNOSIS — E8770 Fluid overload, unspecified: Secondary | ICD-10-CM | POA: Diagnosis not present

## 2015-06-09 ENCOUNTER — Ambulatory Visit: Payer: Medicare Other

## 2015-06-10 DIAGNOSIS — E119 Type 2 diabetes mellitus without complications: Secondary | ICD-10-CM | POA: Diagnosis not present

## 2015-06-10 DIAGNOSIS — I5030 Unspecified diastolic (congestive) heart failure: Secondary | ICD-10-CM | POA: Diagnosis not present

## 2015-06-10 DIAGNOSIS — G4733 Obstructive sleep apnea (adult) (pediatric): Secondary | ICD-10-CM | POA: Diagnosis not present

## 2015-06-10 DIAGNOSIS — M6281 Muscle weakness (generalized): Secondary | ICD-10-CM | POA: Diagnosis not present

## 2015-06-10 DIAGNOSIS — I4891 Unspecified atrial fibrillation: Secondary | ICD-10-CM | POA: Diagnosis not present

## 2015-06-12 DIAGNOSIS — I10 Essential (primary) hypertension: Secondary | ICD-10-CM | POA: Diagnosis not present

## 2015-06-12 DIAGNOSIS — E559 Vitamin D deficiency, unspecified: Secondary | ICD-10-CM | POA: Diagnosis not present

## 2015-06-30 ENCOUNTER — Encounter (HOSPITAL_COMMUNITY): Payer: Self-pay | Admitting: *Deleted

## 2015-06-30 ENCOUNTER — Emergency Department (HOSPITAL_COMMUNITY): Payer: Medicare Other

## 2015-06-30 ENCOUNTER — Observation Stay (HOSPITAL_COMMUNITY)
Admission: EM | Admit: 2015-06-30 | Discharge: 2015-07-02 | Disposition: A | Discharge status: Home / Self Care | Payer: Medicare Other | Attending: Internal Medicine | Admitting: Internal Medicine

## 2015-06-30 DIAGNOSIS — D631 Anemia in chronic kidney disease: Secondary | ICD-10-CM | POA: Diagnosis not present

## 2015-06-30 DIAGNOSIS — R112 Nausea with vomiting, unspecified: Secondary | ICD-10-CM

## 2015-06-30 DIAGNOSIS — I82409 Acute embolism and thrombosis of unspecified deep veins of unspecified lower extremity: Secondary | ICD-10-CM

## 2015-06-30 DIAGNOSIS — I130 Hypertensive heart and chronic kidney disease with heart failure and stage 1 through stage 4 chronic kidney disease, or unspecified chronic kidney disease: Secondary | ICD-10-CM | POA: Diagnosis not present

## 2015-06-30 DIAGNOSIS — R1110 Vomiting, unspecified: Secondary | ICD-10-CM | POA: Diagnosis present

## 2015-06-30 DIAGNOSIS — F329 Major depressive disorder, single episode, unspecified: Secondary | ICD-10-CM | POA: Insufficient documentation

## 2015-06-30 DIAGNOSIS — E1121 Type 2 diabetes mellitus with diabetic nephropathy: Secondary | ICD-10-CM | POA: Insufficient documentation

## 2015-06-30 DIAGNOSIS — Z794 Long term (current) use of insulin: Secondary | ICD-10-CM | POA: Diagnosis not present

## 2015-06-30 DIAGNOSIS — I10 Essential (primary) hypertension: Secondary | ICD-10-CM | POA: Diagnosis present

## 2015-06-30 DIAGNOSIS — Z7982 Long term (current) use of aspirin: Secondary | ICD-10-CM | POA: Diagnosis not present

## 2015-06-30 DIAGNOSIS — Z79899 Other long term (current) drug therapy: Secondary | ICD-10-CM | POA: Insufficient documentation

## 2015-06-30 DIAGNOSIS — E1122 Type 2 diabetes mellitus with diabetic chronic kidney disease: Secondary | ICD-10-CM | POA: Diagnosis not present

## 2015-06-30 DIAGNOSIS — R0609 Other forms of dyspnea: Secondary | ICD-10-CM | POA: Diagnosis not present

## 2015-06-30 DIAGNOSIS — E119 Type 2 diabetes mellitus without complications: Secondary | ICD-10-CM

## 2015-06-30 DIAGNOSIS — K5900 Constipation, unspecified: Secondary | ICD-10-CM | POA: Diagnosis not present

## 2015-06-30 DIAGNOSIS — K219 Gastro-esophageal reflux disease without esophagitis: Secondary | ICD-10-CM | POA: Diagnosis not present

## 2015-06-30 DIAGNOSIS — E785 Hyperlipidemia, unspecified: Secondary | ICD-10-CM | POA: Diagnosis not present

## 2015-06-30 DIAGNOSIS — N184 Chronic kidney disease, stage 4 (severe): Secondary | ICD-10-CM | POA: Diagnosis present

## 2015-06-30 DIAGNOSIS — R001 Bradycardia, unspecified: Secondary | ICD-10-CM | POA: Diagnosis not present

## 2015-06-30 DIAGNOSIS — I5032 Chronic diastolic (congestive) heart failure: Secondary | ICD-10-CM

## 2015-06-30 DIAGNOSIS — N179 Acute kidney failure, unspecified: Secondary | ICD-10-CM | POA: Diagnosis present

## 2015-06-30 DIAGNOSIS — R569 Unspecified convulsions: Secondary | ICD-10-CM

## 2015-06-30 DIAGNOSIS — Z6841 Body Mass Index (BMI) 40.0 and over, adult: Secondary | ICD-10-CM | POA: Diagnosis not present

## 2015-06-30 DIAGNOSIS — Z7901 Long term (current) use of anticoagulants: Secondary | ICD-10-CM | POA: Diagnosis not present

## 2015-06-30 HISTORY — DX: Acute embolism and thrombosis of unspecified deep veins of unspecified lower extremity: I82.409

## 2015-06-30 HISTORY — DX: Chronic diastolic (congestive) heart failure: I50.32

## 2015-06-30 LAB — COMPREHENSIVE METABOLIC PANEL
ALT: 21 U/L (ref 14–54)
AST: 19 U/L (ref 15–41)
Albumin: 3.5 g/dL (ref 3.5–5.0)
Alkaline Phosphatase: 92 U/L (ref 38–126)
Anion gap: 6 (ref 5–15)
BUN: 41 mg/dL — AB (ref 6–20)
CHLORIDE: 112 mmol/L — AB (ref 101–111)
CO2: 21 mmol/L — AB (ref 22–32)
CREATININE: 2.46 mg/dL — AB (ref 0.44–1.00)
Calcium: 8.7 mg/dL — ABNORMAL LOW (ref 8.9–10.3)
GFR calc Af Amer: 22 mL/min — ABNORMAL LOW (ref >60)
GFR calc non Af Amer: 19 mL/min — ABNORMAL LOW (ref >60)
Glucose, Bld: 185 mg/dL — ABNORMAL HIGH (ref 65–99)
POTASSIUM: 4.9 mmol/L (ref 3.5–5.1)
SODIUM: 139 mmol/L (ref 135–145)
Total Bilirubin: 0.5 mg/dL (ref 0.3–1.2)
Total Protein: 6.4 g/dL — ABNORMAL LOW (ref 6.5–8.1)

## 2015-06-30 LAB — URINALYSIS, ROUTINE W REFLEX MICROSCOPIC
Bilirubin Urine: NEGATIVE
GLUCOSE, UA: NEGATIVE mg/dL
Hgb urine dipstick: NEGATIVE
KETONES UR: NEGATIVE mg/dL
LEUKOCYTES UA: NEGATIVE
NITRITE: NEGATIVE
PH: 5.5 (ref 5.0–8.0)
PROTEIN: 100 mg/dL — AB
Specific Gravity, Urine: 1.015 (ref 1.005–1.030)

## 2015-06-30 LAB — URINE MICROSCOPIC-ADD ON
BACTERIA UA: NONE SEEN
SQUAMOUS EPITHELIAL / LPF: NONE SEEN

## 2015-06-30 LAB — CBC WITH DIFFERENTIAL/PLATELET
BASOS ABS: 0 10*3/uL (ref 0.0–0.1)
BASOS PCT: 0 %
EOS ABS: 0 10*3/uL (ref 0.0–0.7)
EOS PCT: 0 %
HCT: 30 % — ABNORMAL LOW (ref 36.0–46.0)
Hemoglobin: 9.8 g/dL — ABNORMAL LOW (ref 12.0–15.0)
Lymphocytes Relative: 21 %
Lymphs Abs: 1.2 10*3/uL (ref 0.7–4.0)
MCH: 29.3 pg (ref 26.0–34.0)
MCHC: 32.7 g/dL (ref 30.0–36.0)
MCV: 89.6 fL (ref 78.0–100.0)
MONO ABS: 0.4 10*3/uL (ref 0.1–1.0)
Monocytes Relative: 7 %
Neutro Abs: 4.2 10*3/uL (ref 1.7–7.7)
Neutrophils Relative %: 72 %
PLATELETS: 209 10*3/uL (ref 150–400)
RBC: 3.35 MIL/uL — AB (ref 3.87–5.11)
RDW: 12.2 % (ref 11.5–15.5)
WBC: 5.9 10*3/uL (ref 4.0–10.5)

## 2015-06-30 LAB — PROTIME-INR
INR: 1.19 (ref 0.00–1.49)
Prothrombin Time: 15.2 seconds (ref 11.6–15.2)

## 2015-06-30 LAB — MAGNESIUM: Magnesium: 2 mg/dL (ref 1.7–2.4)

## 2015-06-30 LAB — PHOSPHORUS: Phosphorus: 3.8 mg/dL (ref 2.5–4.6)

## 2015-06-30 LAB — GLUCOSE, CAPILLARY
Glucose-Capillary: 89 mg/dL (ref 65–99)
Glucose-Capillary: 149 mg/dL — ABNORMAL HIGH (ref 65–99)
Glucose-Capillary: 158 mg/dL — ABNORMAL HIGH (ref 65–99)

## 2015-06-30 LAB — LIPASE, BLOOD: LIPASE: 36 U/L (ref 11–51)

## 2015-06-30 LAB — I-STAT CG4 LACTIC ACID, ED: LACTIC ACID, VENOUS: 1.59 mmol/L (ref 0.5–2.0)

## 2015-06-30 MED ORDER — EZETIMIBE 10 MG PO TABS
10.0000 mg | ORAL_TABLET | Freq: Every day | ORAL | Status: DC
  Administered 2015-06-30 – 2015-07-01 (×2): 10 mg via ORAL
  Filled 2015-06-30 (×2): qty 1

## 2015-06-30 MED ORDER — SODIUM BICARBONATE 650 MG PO TABS
1300.0000 mg | ORAL_TABLET | Freq: Two times a day (BID) | ORAL | Status: DC
  Administered 2015-06-30 – 2015-07-02 (×4): 1300 mg via ORAL
  Filled 2015-06-30 (×6): qty 2

## 2015-06-30 MED ORDER — CALCIUM ACETATE (PHOS BINDER) 667 MG PO CAPS
667.0000 mg | ORAL_CAPSULE | Freq: Three times a day (TID) | ORAL | Status: DC
  Administered 2015-06-30 – 2015-07-02 (×7): 667 mg via ORAL
  Filled 2015-06-30 (×7): qty 1

## 2015-06-30 MED ORDER — CARBAMAZEPINE 200 MG PO TABS
400.0000 mg | ORAL_TABLET | Freq: Two times a day (BID) | ORAL | Status: DC
  Filled 2015-06-30: qty 2

## 2015-06-30 MED ORDER — AMLODIPINE-OLMESARTAN 10-20 MG PO TABS: 1.0000 | ORAL_TABLET | Freq: Every day | ORAL | Status: DC

## 2015-06-30 MED ORDER — INSULIN ASPART 100 UNIT/ML ~~LOC~~ SOLN
0.0000 [IU] | Freq: Three times a day (TID) | SUBCUTANEOUS | Status: DC
  Administered 2015-06-30: 2 [IU] via SUBCUTANEOUS
  Administered 2015-07-01: 9 [IU] via SUBCUTANEOUS
  Administered 2015-07-01: 3 [IU] via SUBCUTANEOUS
  Administered 2015-07-01: 5 [IU] via SUBCUTANEOUS

## 2015-06-30 MED ORDER — SODIUM CHLORIDE 0.9 % IV BOLUS (SEPSIS): 1000.0000 mL | Freq: Once | INTRAVENOUS | Status: DC

## 2015-06-30 MED ORDER — EZETIMIBE-SIMVASTATIN 10-40 MG PO TABS
1.0000 | ORAL_TABLET | Freq: Every day | ORAL | Status: DC
  Filled 2015-06-30: qty 1

## 2015-06-30 MED ORDER — ONDANSETRON HCL 4 MG/2ML IJ SOLN
4.0000 mg | Freq: Three times a day (TID) | INTRAMUSCULAR | Status: AC | PRN
Start: 2015-06-30 — End: 2015-06-30

## 2015-06-30 MED ORDER — ATORVASTATIN CALCIUM 20 MG PO TABS
20.0000 mg | ORAL_TABLET | Freq: Every day | ORAL | Status: DC
  Administered 2015-06-30 – 2015-07-01 (×2): 20 mg via ORAL
  Filled 2015-06-30 (×2): qty 1

## 2015-06-30 MED ORDER — WARFARIN SODIUM 7.5 MG PO TABS
7.5000 mg | ORAL_TABLET | Freq: Once | ORAL | Status: AC
  Administered 2015-06-30: 7.5 mg via ORAL
  Filled 2015-06-30: qty 1

## 2015-06-30 MED ORDER — PANTOPRAZOLE SODIUM 40 MG PO TBEC
80.0000 mg | DELAYED_RELEASE_TABLET | Freq: Every day | ORAL | Status: DC
  Administered 2015-06-30 – 2015-07-02 (×3): 80 mg via ORAL
  Filled 2015-06-30 (×3): qty 2

## 2015-06-30 MED ORDER — PROMETHAZINE HCL 25 MG PO TABS: 12.5000 mg | ORAL_TABLET | Freq: Four times a day (QID) | ORAL | Status: DC | PRN

## 2015-06-30 MED ORDER — ASPIRIN 81 MG PO CHEW
81.0000 mg | CHEWABLE_TABLET | Freq: Every day | ORAL | Status: DC
  Administered 2015-06-30 – 2015-07-02 (×3): 81 mg via ORAL
  Filled 2015-06-30 (×3): qty 1

## 2015-06-30 MED ORDER — LACOSAMIDE 100 MG PO TABS: 1.0000 | ORAL_TABLET | Freq: Two times a day (BID) | ORAL | Status: DC

## 2015-06-30 MED ORDER — ONDANSETRON HCL 4 MG/2ML IJ SOLN
4.0000 mg | Freq: Once | INTRAMUSCULAR | Status: AC
  Administered 2015-07-01: 4 mg via INTRAVENOUS
  Filled 2015-06-30: qty 2

## 2015-06-30 MED ORDER — INSULIN ASPART PROT & ASPART (70-30 MIX) 100 UNIT/ML ~~LOC~~ SUSP
20.0000 [IU] | Freq: Every day | SUBCUTANEOUS | Status: DC
  Administered 2015-07-01 – 2015-07-02 (×2): 20 [IU] via SUBCUTANEOUS
  Filled 2015-06-30 (×3): qty 10

## 2015-06-30 MED ORDER — IRBESARTAN 150 MG PO TABS
150.0000 mg | ORAL_TABLET | Freq: Every day | ORAL | Status: DC
  Administered 2015-06-30 – 2015-07-02 (×3): 150 mg via ORAL
  Filled 2015-06-30 (×3): qty 1

## 2015-06-30 MED ORDER — SODIUM CHLORIDE 0.9% FLUSH
3.0000 mL | Freq: Two times a day (BID) | INTRAVENOUS | Status: DC
  Administered 2015-06-30 – 2015-07-02 (×2): 3 mL via INTRAVENOUS

## 2015-06-30 MED ORDER — LACOSAMIDE 50 MG PO TABS
100.0000 mg | ORAL_TABLET | Freq: Two times a day (BID) | ORAL | Status: DC
  Administered 2015-06-30 – 2015-07-02 (×4): 100 mg via ORAL
  Filled 2015-06-30 (×4): qty 2

## 2015-06-30 MED ORDER — WARFARIN - PHARMACIST DOSING INPATIENT
Freq: Every day | Status: DC
  Administered 2015-06-30: 18:00:00

## 2015-06-30 MED ORDER — INSULIN ASPART 100 UNIT/ML ~~LOC~~ SOLN: 0.0000 [IU] | Freq: Every day | SUBCUTANEOUS | Status: DC

## 2015-06-30 MED ORDER — FUROSEMIDE 20 MG PO TABS
160.0000 mg | ORAL_TABLET | Freq: Two times a day (BID) | ORAL | Status: DC
  Administered 2015-06-30 – 2015-07-01 (×3): 160 mg via ORAL
  Filled 2015-06-30 (×4): qty 8

## 2015-06-30 MED ORDER — SODIUM CHLORIDE 0.9 % IV SOLN
INTRAVENOUS | Status: DC
  Administered 2015-06-30: 14:00:00 via INTRAVENOUS

## 2015-06-30 MED ORDER — ACETAMINOPHEN 325 MG PO TABS: 650.0000 mg | ORAL_TABLET | Freq: Four times a day (QID) | ORAL | Status: DC | PRN

## 2015-06-30 MED ORDER — CALCITRIOL 0.25 MCG PO CAPS
0.2500 ug | ORAL_CAPSULE | Freq: Every day | ORAL | Status: DC
  Administered 2015-06-30 – 2015-07-02 (×3): 0.25 ug via ORAL
  Filled 2015-06-30 (×3): qty 1

## 2015-06-30 MED ORDER — CARBAMAZEPINE 200 MG PO TABS
200.0000 mg | ORAL_TABLET | Freq: Two times a day (BID) | ORAL | Status: DC
  Administered 2015-06-30 – 2015-07-02 (×4): 200 mg via ORAL
  Filled 2015-06-30 (×4): qty 1

## 2015-06-30 MED ORDER — SENNA 8.6 MG PO TABS
1.0000 | ORAL_TABLET | Freq: Every day | ORAL | Status: DC
  Administered 2015-06-30: 8.6 mg via ORAL
  Filled 2015-06-30 (×2): qty 1

## 2015-06-30 MED ORDER — INSULIN ISOPHANE & REGULAR (HUMAN 70-30)100 UNIT/ML KWIKPEN: 20.0000 [IU] | PEN_INJECTOR | Freq: Every day | SUBCUTANEOUS | Status: DC

## 2015-06-30 MED ORDER — ACETAMINOPHEN 650 MG RE SUPP: 650.0000 mg | Freq: Four times a day (QID) | RECTAL | Status: DC | PRN

## 2015-06-30 MED ORDER — AMLODIPINE BESYLATE 10 MG PO TABS
10.0000 mg | ORAL_TABLET | Freq: Every day | ORAL | Status: DC
  Administered 2015-06-30 – 2015-07-02 (×3): 10 mg via ORAL
  Filled 2015-06-30 (×3): qty 1

## 2015-06-30 MED ORDER — ENSURE ENLIVE PO LIQD
237.0000 mL | Freq: Two times a day (BID) | ORAL | Status: DC
  Administered 2015-07-01 – 2015-07-02 (×2): 237 mL via ORAL

## 2015-06-30 MED ORDER — POLYETHYLENE GLYCOL 3350 17 G PO PACK
17.0000 g | PACK | Freq: Every day | ORAL | Status: DC
  Administered 2015-06-30: 17 g via ORAL
  Filled 2015-06-30 (×2): qty 1

## 2015-06-30 MED ORDER — CITALOPRAM HYDROBROMIDE 10 MG PO TABS
20.0000 mg | ORAL_TABLET | Freq: Every day | ORAL | Status: DC
  Administered 2015-06-30 – 2015-07-02 (×3): 20 mg via ORAL
  Filled 2015-06-30 (×3): qty 2

## 2015-06-30 MED ORDER — HYDRALAZINE HCL 20 MG/ML IJ SOLN
10.0000 mg | INTRAMUSCULAR | Status: DC | PRN
  Administered 2015-07-01: 10 mg via INTRAVENOUS
  Filled 2015-06-30: qty 1

## 2015-06-30 MED ORDER — HYDRALAZINE HCL 25 MG PO TABS
100.0000 mg | ORAL_TABLET | Freq: Three times a day (TID) | ORAL | Status: DC
  Administered 2015-06-30 – 2015-07-02 (×6): 100 mg via ORAL
  Filled 2015-06-30 (×6): qty 4

## 2015-06-30 NOTE — ED Notes (Signed)
Pt to ED from home by [facility] c/o NV since last night. Denies pain  EMS VS cbg 153 bp 148/86 Hr 44 palpated 96%

## 2015-06-30 NOTE — H&P (Signed)
History and Physical    [name] [id number] DOB: 02-Feb-1945 DOA: 06/30/2015  PCP: Benito Mccreedy, MD Patient coming from: Home  Chief Complaint: emesis and dizziness  HPI: [name] is a 70 y.o. female with medical history significant of HTN, CKD, HLD, GERD, Depression, CHF, DM, anemia, SZR,  presenting for treatment of emesis and dizziness.  Of note patient is a very poor historian, especially in relation to her home medications. Patient has little to no idea what medicine she takes at home. She states that she has 11-14 bottles by her sink and she takes them as directed but is not sure which medicines they are. When asked specifically about taking blood pressure medicines she states that she does take metoprolol. Of note this medicine was supposed to have been stopped at her last admission in November. Family members will be going to the patient's house to clarify medicines.  Patient reports onset of nausea and food product emesis shortly after dinner on 06/29/2015. This occurred at20:00. After emesis patient felt significantly better, but continued with nausea throughout the night. Patient awoke at 03:00 to go to the bathroom with additional episodes of emesis. Nonbloody nonbilious. Emesis came on after taking Pepto-Bismol for nausea which did not work. Patient states that she developed palpitations yesterday afternoon prior to onset of other symptoms. Patient states that at time of onset of palpitations she had become very upset over some very distressing family matters. Denies chest pain, fevers, diarrhea, constipation, neck stiffness, LOC, vertigo, shortness of breath, lower extremity edema. After treatments in the ED patient states that she no longer has any nausea but gets very lightheaded when having to sit up or stand up.   ED Course: Patient given single dose of Zofran and 1 L normal saline bolus with resolution of her nausea and overall symptomatically improving. Noted to  be bradycardic and cardiology was consulted. Patient also hypertensive. Hydralazine added as a when necessary order does not yet been given. Patient currently asking for food.  Review of Systems: As per HPI otherwise 10 point review of systems negative.   Ambulatory Status: no restrictions  Past Medical History  Diagnosis Date  . Essential hypertension   . CKD (chronic kidney disease), stage IV ([place])   . Hyperlipidemia   . GERD (gastroesophageal reflux disease)   . Morbid obesity ([place])   . Depression   . CHF (congestive heart failure) ([place])   . Type II diabetes mellitus ([place])   . Anemia   . Seizures ([place])     "epilepsy; they think I had one a couple months ago" (12/11/2014)  . DVT (deep venous thrombosis) [facility] [place])     Past Surgical History  Procedure Laterality Date  . Tubal ligation    . Thrombectomy brachial artery Left 12/15/2014    Procedure: THROMBECTOMY BRACHIAL ARTERY; VEIN PATCH ANGIOPLASTY LEFT BRACIAL ARTERY;  Surgeon: Angelia Mould, MD;  Location: [place];  Service: Vascular;  Laterality: Left;     reports that she has never smoked. She has never used smokeless tobacco. She reports that she does not drink alcohol or use illicit drugs.  Allergies  Allergen Reactions  . Codeine Nausea And Vomiting    Family History  Problem Relation Age of Onset  . Hypertension Mother     died @ 37 - unknown cause.  [name] Hypertension Father     says she doesn't know anything about her father.  . Cancer Maternal Aunt   . Seizures Neg Hx  Prior to Admission medications   Medication Sig Start Date End Date Taking? Authorizing Provider  albuterol (PROVENTIL) (2.5 MG/3ML) 0.083% nebulizer solution Take 3 mLs (2.5 mg total) by nebulization every 6 (six) hours as needed for wheezing or shortness of breath. 12/21/14   Ripudeep Krystal Eaton, MD  amLODipine (NORVASC) 10 MG tablet Take 10 mg by mouth daily.    Historical Provider, MD  Ascorbic Acid (VITAMIN C PO) Take 1 tablet by mouth  daily.    Historical Provider, MD  aspirin 81 MG chewable tablet Chew 1 tablet (81 mg total) by mouth daily. 12/21/14   Ripudeep Krystal Eaton, MD  bisacodyl (DULCOLAX) 5 MG EC tablet Take 2 tablets (10 mg total) by mouth every morning. 12/21/14   Ripudeep Krystal Eaton, MD  calcitRIOL (ROCALTROL) 0.25 MCG capsule Take 1 capsule (0.25 mcg total) by mouth daily. 12/21/14   Ripudeep Krystal Eaton, MD  calcium acetate (PHOSLO) 667 MG capsule Take 1 capsule (667 mg total) by mouth 3 (three) times daily with meals. 12/21/14   Ripudeep Krystal Eaton, MD  carbamazepine (TEGRETOL) 200 MG tablet Take 400 mg by mouth 2 (two) times daily.    Historical Provider, MD  Cholecalciferol (VITAMIN D-3) 1000 UNITS CAPS Take 1,000 Units by mouth daily at 12 noon.     Historical Provider, MD  citalopram (CELEXA) 20 MG tablet Take 20 mg by mouth daily.    Historical Provider, MD  clobetasol cream (TEMOVATE) [id number] % Apply 1 application topically 2 (two) times daily as needed (for legs).     Historical Provider, MD  docusate sodium (COLACE) 100 MG capsule Take 2 capsules (200 mg total) by mouth 2 (two) times daily. 12/21/14   Ripudeep Krystal Eaton, MD  enoxaparin (LOVENOX) 100 MG/ML injection Inject 1 mL (100 mg total) into the skin daily. UNTIL INR above 2, then stop lovenox 12/21/14   Ripudeep K Rai, MD  ezetimibe-simvastatin (VYTORIN) 10-40 MG per tablet Take 1 tablet by mouth daily.    Historical Provider, MD  furosemide (LASIX) 80 MG tablet Take 2 tablets (160 mg total) by mouth 2 (two) times daily. 12/21/14   Ripudeep Krystal Eaton, MD  glipiZIDE (GLUCOTROL XL) 5 MG 24 hr tablet Take 5 mg by mouth daily with breakfast.    Historical Provider, MD  HUMULIN 70/30 KWIKPEN (70-30) 100 UNIT/ML PEN Inject 20 Units into the skin 2 (two) times daily. 10/18/14   Historical Provider, MD  hydrALAZINE (APRESOLINE) 100 MG tablet Take 1 tablet (100 mg total) by mouth every 8 (eight) hours. 12/21/14   Ripudeep Krystal Eaton, MD  Lacosamide (VIMPAT) 100 MG TABS Take 1 tablet by mouth 2  (two) times daily.    Historical Provider, MD  omeprazole (PRILOSEC) 40 MG capsule Take 40 mg by mouth daily.    Historical Provider, MD  oxyCODONE-acetaminophen (PERCOCET/ROXICET) 5-325 MG tablet Take 1 tablet by mouth every 6 (six) hours as needed for moderate pain or severe pain. 12/21/14   Ripudeep Krystal Eaton, MD  sodium bicarbonate 650 MG tablet Take 2 tablets (1,300 mg total) by mouth 2 (two) times daily. 12/21/14   Ripudeep Krystal Eaton, MD  warfarin (COUMADIN) 5 MG tablet Please take 10 mg (2tabs) today and tomorrow (11/27) evening, then check INR on 11/28. May reduce dose to 7.5 mg (1 and half tab) from 11/29 and adjust according to PT/INR readings. 12/21/14   Ripudeep Krystal Eaton, MD    Physical Exam: Filed Vitals:   06/30/15 1000 06/30/15 1015 06/30/15 1030 06/30/15 1045  BP:    160/53  Pulse: 42 38 41 44  Temp:      TempSrc:      Resp: 17 12 14 16   SpO2: 98% 100% 100% 97%      Constitutional: NAD, calm, comfortable Eyes:  PERRL, lids and conjunctivae normal ENMT: few remaining teeth, moist mm,  Neck:  normal, supple, no masses, no thyromegaly Respiratory:  clear to auscultation bilaterally, no wheezing, no crackles. Normal respiratory effort. No accessory muscle use.  Cardiovascular: Bradycardic but RRR, no murmurs / rubs / gallops. No extremity edema. 2+ pedal pulses. No carotid bruits.  Abdomen: Obese,  no tenderness, no masses palpated.Bowel sounds positive.  Musculoskeletal:  no clubbing / cyanosis. No joint deformity upper and lower extremities. Good ROM, no contractures. Normal muscle tone.  Skin:  no rashes, lesions, ulcers. No induration Neurologic:  CN 2-12 grossly intact. Sensation intact, Strength 5/5 in all 4.  Psychiatric:  Normal judgment and insight. Alert and oriented x 3. Normal mood.    Labs on Admission: I have personally reviewed following labs and imaging studies  CBC:  Recent Labs Lab 06/30/15 0652  WBC 5.9  NEUTROABS 4.2  HGB 9.8*  HCT 30.0*  MCV 89.6    PLT [id number]   Basic Metabolic Panel:  Recent Labs Lab 06/30/15 0652  NA 139  K 4.9  CL 112*  CO2 21*  GLUCOSE 185*  BUN 41*  CREATININE 2.46*  CALCIUM 8.7*  MG 2.0   GFR: CrCl cannot be calculated (Unknown ideal weight.). Liver Function Tests:  Recent Labs Lab 06/30/15 0652  AST 19  ALT 21  ALKPHOS 92  BILITOT 0.5  PROT 6.4*  ALBUMIN 3.5    Recent Labs Lab 06/30/15 0652  LIPASE 36   No results for input(s): AMMONIA in the last 168 hours. Coagulation Profile:  Recent Labs Lab 06/30/15 0652  INR 1.19   Cardiac Enzymes: No results for input(s): CKTOTAL, CKMB, CKMBINDEX, TROPONINI in the last 168 hours. BNP (last 3 results) No results for input(s): PROBNP in the last 8760 hours. HbA1C: No results for input(s): HGBA1C in the last 72 hours. CBG: No results for input(s): GLUCAP in the last 168 hours. Lipid Profile: No results for input(s): CHOL, HDL, LDLCALC, TRIG, CHOLHDL, LDLDIRECT in the last 72 hours. Thyroid Function Tests: No results for input(s): TSH, T4TOTAL, FREET4, T3FREE, THYROIDAB in the last 72 hours. Anemia Panel: No results for input(s): VITAMINB12, FOLATE, FERRITIN, TIBC, IRON, RETICCTPCT in the last 72 hours. Urine analysis:    Component Value Date/Time   COLORURINE YELLOW 06/30/2015 [place] 06/30/2015 0939   LABSPEC 1.015 06/30/2015 0939   PHURINE 5.5 06/30/2015 0939   GLUCOSEU NEGATIVE 06/30/2015 0939   HGBUR NEGATIVE 06/30/2015 0939   BILIRUBINUR NEGATIVE 06/30/2015 0939   KETONESUR NEGATIVE 06/30/2015 0939   PROTEINUR 100* 06/30/2015 0939   UROBILINOGEN 0.2 11/14/2014 1946   NITRITE NEGATIVE 06/30/2015 0939   LEUKOCYTESUR NEGATIVE 06/30/2015 0939    Creatinine Clearance: CrCl cannot be calculated (Unknown ideal weight.).  Sepsis Labs: @LABRCNTIP (procalcitonin:4,lacticidven:4) )No results found for this or any previous visit (from the past 240 hour(s)).   Radiological Exams on Admission: Dg Chest 2  View  06/30/2015  CLINICAL DATA:  Nausea, vomiting and bradycardia beginning last night. Initial encounter. EXAM: CHEST  2 VIEW COMPARISON:  PA and lateral chest 12/14/2014. FINDINGS: The lungs are clear. Heart size is upper normal. No pneumothorax or pleural effusion. Asymmetric elevation of the right hemidiaphragm relative to the left  is unchanged. IMPRESSION: No acute disease. Electronically Signed   By: Inge Rise M.D.   On: 06/30/2015 07:35    EKG: Independently reviewed. Sinus brady  Assessment/Plan Active Problems:   Hyperlipidemia   Morbid obesity (HCC)   Seizure (HCC)   Bradycardia   Vomiting   CKD (chronic kidney disease), stage IV ([facility])   Essential hypertension   Type II diabetes mellitus (H[facility])   DVT (deep venous thrombosis) (H[facility])   Chronic diastolic (congestive) heart failure (H[facility])   Symptomatic Bradycardia: HR sustainted in low 40s. Review of records shows baseline 80s. Likely medication induced versus EP cardiac dysfunction. Patient with exceptionally poor understanding of home medication regimen and endorses use of beta blocker though this was discontinued 7 months ago. - Cards consulted. F/u recs - Tele obs - DC metop as still listed on home med list.  - Family to bring in home medications for clarification - Orthostatic VS - continue ASA  Nausea/vomiting: suspect from either bradycardia vs viral gastro. Improved w/ IVF and zofran. No evidence of other acute process as wo/ elevated lactic acid, WBC, no ABD pain or diarrhea.  - IVF - phenergan  HTN:  - hold all rate controlling medications - continue Azor, Hydralazine - Will add additional IV hydralazine for additional coverage per cards.   Szr: last seizure several months ago. - continue vimpat and Carbamazepine   CKD: Cr 2.46. Baseline 3.  - continue renal vitamins - IVF as above - BMET in am  Diastolic CHF: last echo, [id number] showing EF 55% and moderate concentric hypertrophy consistent w/ diastolic  dysfunction. No acute decompensation.  - continue lasix 160 BID  DM: - continue home 70/30 - SSI - A1c  DVT: L brachial in 2016 - continue coumadin  HLD: - continue Vytorin  GERD: - continue PPI  Depression: - continue Celexa  Constipation: - Miralax and senna   DVT prophylaxis: Coumadin  Code Status: full  Family Communication: none  Disposition Plan: pending improvement and cards clearance  Consults called: Cards  Admission status: obs, tele    Jorge Amparo J MD [facility]  If 7PM-7AM, please contact night-coverage [email or web address] Password [email or web address]  06/30/2015, 11:28 AM

## 2015-06-30 NOTE — ED Provider Notes (Signed)
CSN: [id number]     Arrival date & time 06/30/15  [id number] History   First MD Initiated Contact with Patient 06/30/15 [phone]     Chief Complaint  Patient presents with  . Emesis     (Consider location/radiation/quality/duration/timing/severity/associated sxs/prior Treatment) HPI   [name] is a 70 y.o. female, with a history of CHF, DM, hypertension, and stage IV CKD, presenting to the ED with two episodes of emesis since around 8 pm last night. Pt states she was given some bad news that worried her last night, which made her nauseous and then vomit. Pt then went to sleep and then woke up at around 3-4 AM with the same worry, nausea, and then an episode of vomiting. Patient denies any current nausea. Further denies fever/chills, recent illness, abdominal pain, chest pain, shortness of breath, or any other complaints.    Past Medical History  Diagnosis Date  . Essential hypertension   . CKD (chronic kidney disease), stage IV ([place])   . Hyperlipidemia   . GERD (gastroesophageal reflux disease)   . Morbid obesity ([place])   . Depression   . CHF (congestive heart failure) ([place])   . Type II diabetes mellitus ([place])   . Anemia   . Seizures ([place])     "epilepsy; they think I had one a couple months ago" (12/11/2014)   Past Surgical History  Procedure Laterality Date  . Tubal ligation    . Thrombectomy brachial artery Left 12/15/2014    Procedure: THROMBECTOMY BRACHIAL ARTERY; VEIN PATCH ANGIOPLASTY LEFT BRACIAL ARTERY;  Surgeon: Angelia Mould, MD;  Location: [facility] OR;  Service: Vascular;  Laterality: Left;   Family History  Problem Relation Age of Onset  . Hypertension Mother     died @ 70 - unknown cause.  [name] Hypertension Father     says she doesn't know anything about her father.  . Cancer Maternal Aunt   . Seizures Neg Hx    Social History  Substance Use Topics  . Smoking status: Never Smoker   . Smokeless tobacco: Never Used     Comment: EXPOSED TO 2ND HAND SMOKE X 20+  YEARS.   [name] Alcohol Use: No   OB History    No data available     Review of Systems  Constitutional: Negative for fever, chills and diaphoresis.  Respiratory: Negative for shortness of breath.   Cardiovascular: Negative for chest pain.  Gastrointestinal: Positive for nausea (resolved) and vomiting. Negative for abdominal pain.  Genitourinary: Negative for dysuria, hematuria and flank pain.  Musculoskeletal: Negative for back pain.  Skin: Negative for color change and pallor.  Neurological: Negative for dizziness, syncope, weakness, light-headedness, numbness and headaches.  All other systems reviewed and are negative.     Allergies  Codeine  Home Medications   Prior to Admission medications   Medication Sig Start Date End Date Taking? Authorizing Provider  albuterol (PROVENTIL) (2.5 MG/3ML) 0.083% nebulizer solution Take 3 mLs (2.5 mg total) by nebulization every 6 (six) hours as needed for wheezing or shortness of breath. 12/21/14   Ripudeep Krystal Eaton, MD  amLODipine (NORVASC) 10 MG tablet Take 10 mg by mouth daily.    Historical Provider, MD  Ascorbic Acid (VITAMIN C PO) Take 1 tablet by mouth daily.    Historical Provider, MD  aspirin 81 MG chewable tablet Chew 1 tablet (81 mg total) by mouth daily. 12/21/14   Ripudeep Krystal Eaton, MD  bisacodyl (DULCOLAX) 5 MG EC tablet Take 2 tablets (10 mg  total) by mouth every morning. 12/21/14   Ripudeep Krystal Eaton, MD  calcitRIOL (ROCALTROL) 0.25 MCG capsule Take 1 capsule (0.25 mcg total) by mouth daily. 12/21/14   Ripudeep Krystal Eaton, MD  calcium acetate (PHOSLO) 667 MG capsule Take 1 capsule (667 mg total) by mouth 3 (three) times daily with meals. 12/21/14   Ripudeep Krystal Eaton, MD  carbamazepine (TEGRETOL) 200 MG tablet Take 400 mg by mouth 2 (two) times daily.    Historical Provider, MD  Cholecalciferol (VITAMIN D-3) 1000 UNITS CAPS Take 1,000 Units by mouth daily at 12 noon.     Historical Provider, MD  citalopram (CELEXA) 20 MG tablet Take 20 mg by mouth  daily.    Historical Provider, MD  clobetasol cream (TEMOVATE) [id number] % Apply 1 application topically 2 (two) times daily as needed (for legs).     Historical Provider, MD  docusate sodium (COLACE) 100 MG capsule Take 2 capsules (200 mg total) by mouth 2 (two) times daily. 12/21/14   Ripudeep Krystal Eaton, MD  enoxaparin (LOVENOX) 100 MG/ML injection Inject 1 mL (100 mg total) into the skin daily. UNTIL INR above 2, then stop lovenox 12/21/14   Ripudeep K Rai, MD  ezetimibe-simvastatin (VYTORIN) 10-40 MG per tablet Take 1 tablet by mouth daily.    Historical Provider, MD  furosemide (LASIX) 80 MG tablet Take 2 tablets (160 mg total) by mouth 2 (two) times daily. 12/21/14   Ripudeep Krystal Eaton, MD  glipiZIDE (GLUCOTROL XL) 5 MG 24 hr tablet Take 5 mg by mouth daily with breakfast.    Historical Provider, MD  HUMULIN 70/30 KWIKPEN (70-30) 100 UNIT/ML PEN Inject 20 Units into the skin 2 (two) times daily. 10/18/14   Historical Provider, MD  hydrALAZINE (APRESOLINE) 100 MG tablet Take 1 tablet (100 mg total) by mouth every 8 (eight) hours. 12/21/14   Ripudeep Krystal Eaton, MD  Lacosamide (VIMPAT) 100 MG TABS Take 1 tablet by mouth 2 (two) times daily.    Historical Provider, MD  omeprazole (PRILOSEC) 40 MG capsule Take 40 mg by mouth daily.    Historical Provider, MD  oxyCODONE-acetaminophen (PERCOCET/ROXICET) 5-325 MG tablet Take 1 tablet by mouth every 6 (six) hours as needed for moderate pain or severe pain. 12/21/14   Ripudeep Krystal Eaton, MD  sodium bicarbonate 650 MG tablet Take 2 tablets (1,300 mg total) by mouth 2 (two) times daily. 12/21/14   Ripudeep Krystal Eaton, MD  warfarin (COUMADIN) 5 MG tablet Please take 10 mg (2tabs) today and tomorrow (11/27) evening, then check INR on 11/28. May reduce dose to 7.5 mg (1 and half tab) from 11/29 and adjust according to PT/INR readings. 12/21/14   Ripudeep K Rai, MD   BP 180/88 mmHg  Pulse 44  Temp(Src) 98.6 F (37 C) (Oral)  Resp 18  SpO2 99% Physical Exam  Constitutional: She is  oriented to person, place, and time. She appears well-developed and well-nourished. No distress.  HENT:  Head: Normocephalic and atraumatic.  Eyes: Conjunctivae are normal.  Neck: Neck supple.  Cardiovascular: Normal rate, regular rhythm, normal heart sounds and intact distal pulses.   Pulmonary/Chest: Effort normal and breath sounds normal. No respiratory distress.  Abdominal: Soft. There is no tenderness. There is no guarding.  Musculoskeletal: She exhibits no edema or tenderness.  Lymphadenopathy:    She has no cervical adenopathy.  Neurological: She is alert and oriented to person, place, and time.  Skin: Skin is warm and dry. She is not diaphoretic.  Psychiatric: She has  a normal mood and affect. Her behavior is normal.  Nursing note and vitals reviewed.   ED Course  Procedures (including critical care time) Labs Review Labs Reviewed  CBC WITH DIFFERENTIAL/PLATELET - Abnormal; Notable for the following:    RBC 3.35 (*)    Hemoglobin 9.8 (*)    HCT 30.0 (*)    All other components within normal limits  COMPREHENSIVE METABOLIC PANEL - Abnormal; Notable for the following:    Chloride 112 (*)    CO2 21 (*)    Glucose, Bld 185 (*)    BUN 41 (*)    Creatinine, Ser 2.46 (*)    Calcium 8.7 (*)    Total Protein 6.4 (*)    GFR calc non Af Amer 19 (*)    GFR calc Af Amer 22 (*)    All other components within normal limits  URINE CULTURE  LIPASE, BLOOD  PROTIME-INR  MAGNESIUM  URINALYSIS, ROUTINE W REFLEX MICROSCOPIC (NOT AT [facility])  I-STAT CG4 LACTIC ACID, ED  I-STAT TROPOININ, ED   TROPONIN I  Date Value Ref Range Status  12/12/2014 0.49* <0.031 ng/mL Final    Comment:           PERSISTENTLY INCREASED TROPONIN VALUES IN THE RANGE OF 0.04-0.49 ng/mL CAN BE SEEN IN:       -UNSTABLE ANGINA       -CONGESTIVE HEART FAILURE       -MYOCARDITIS       -CHEST TRAUMA       -ARRYHTHMIAS       -LATE PRESENTING MYOCARDIAL INFARCTION       -COPD   CLINICAL FOLLOW-UP  RECOMMENDED.   12/11/2014 0.57* <0.031 ng/mL Final    Comment:           POSSIBLE MYOCARDIAL ISCHEMIA. SERIAL TESTING RECOMMENDED. CRITICAL RESULT CALLED TO, READ BACK BY AND VERIFIED WITHBurman Foster RN [phone] [id number] GREEN R   12/11/2014 0.49* <0.031 ng/mL Final    Comment:           PERSISTENTLY INCREASED TROPONIN VALUES IN THE RANGE OF 0.04-0.49 ng/mL CAN BE SEEN IN:       -UNSTABLE ANGINA       -CONGESTIVE HEART FAILURE       -MYOCARDITIS       -CHEST TRAUMA       -ARRYHTHMIAS       -LATE PRESENTING MYOCARDIAL INFARCTION       -COPD   CLINICAL FOLLOW-UP RECOMMENDED.   12/11/2014 0.28* <0.031 ng/mL Final    Comment:           PERSISTENTLY INCREASED TROPONIN VALUES IN THE RANGE OF 0.04-0.49 ng/mL CAN BE SEEN IN:       -UNSTABLE ANGINA       -CONGESTIVE HEART FAILURE       -MYOCARDITIS       -CHEST TRAUMA       -ARRYHTHMIAS       -LATE PRESENTING MYOCARDIAL INFARCTION       -COPD   CLINICAL FOLLOW-UP RECOMMENDED.    TROPONIN I, POC  Date Value Ref Range Status  11/14/2014 0.01 0.00 - 0.08 ng/mL Final  09/24/2014 0.03 0.00 - 0.08 ng/mL Final     Imaging Review Dg Chest 2 View  06/30/2015  CLINICAL DATA:  Nausea, vomiting and bradycardia beginning last night. Initial encounter. EXAM: CHEST  2 VIEW COMPARISON:  PA and lateral chest 12/14/2014. FINDINGS: The lungs are clear. Heart size is upper normal. No pneumothorax or pleural effusion.  Asymmetric elevation of the right hemidiaphragm relative to the left is unchanged. IMPRESSION: No acute disease. Electronically Signed   By: Inge Rise M.D.   On: 06/30/2015 07:35   I have personally reviewed and evaluated these images and lab results as part of my medical decision-making.   EKG Interpretation   Date/Time:  Monday June 30 2015 06:55:43 EDT Ventricular Rate:  43 PR Interval:  79 QRS Duration: 92 QT Interval:  515 QTC Calculation: 435 R Axis:   30 Text Interpretation:  Junctional rhythm Low voltage,  precordial leads  Baseline wander in lead(s) V2 Confirmed by Glynn Octave [phone])  on 06/30/2015 6:59:50 AM      MDM   Final diagnoses:  Non-intractable vomiting with nausea, vomiting of unspecified type  Bradycardia with 31 - 40 beats per minute    [name] presents with 2 episodes of emesis over the last 12 hours.  Findings and plan of care discussed with Lajean Saver, MD. Dr. Ashok Cordia personally evaluated and examined this patient.  Patient is nontoxic appearing, afebrile, not tachycardic, not tachypneic, not hypotensive, maintains SPO2 of 99% on room air, and is in no apparent distress. Patient has no signs of sepsis or other serious or life-threatening condition. Bradycardia with junctional rhythm noted. Patient is asymptomatic to this bradycardia. Noted to have this issue previously (October and November 2016). Patient previously on Toprol and clonidine, removed from both due to her bradycardia. Suspect patient may be taking some of her discontinue medications, as she is a poor historian when it comes to her medications. Echo in November 2016 showed moderate LVH and EF of 55-60%. Cardiology consulted for the patient's bradycardia. 9:17 AM Spoke with Dr. Irish Lack, cardiologist, who requested admission via hospitalist for her bradycardia. Cardiology to follow the patient during her inpatient stay. 9:46 AM Spoke with Dr. Marily Memos, who agreed to admit the patient to telemetry observation.   Filed Vitals:   06/30/15 0640 06/30/15 0707 06/30/15 0741 06/30/15 0742  BP: 180/88 147/43 155/70   Pulse: 44 40 40 40  Temp: 98.6 F (37 C)     TempSrc: Oral     Resp: 18 16  11   SpO2: 99% 96% 95% 96%   Filed Vitals:   06/30/15 0742 06/30/15 0939 06/30/15 0941 06/30/15 0943  BP:  191/59 154/77 151/81  Pulse: 40 55 43 54  Temp:      TempSrc:      Resp: 11 18 20 20   SpO2: 96% 98% 100% 98%      Lorayne Bender, PA-C 06/30/15 1015  Everlene Balls, MD 06/30/15 1348

## 2015-06-30 NOTE — ED Notes (Signed)
Patient transported to X-ray 

## 2015-06-30 NOTE — Consult Note (Signed)
CARDIOLOGY CONSULT NOTE      Patient ID: [name] MRN: [id number] DOB/AGE: [birth year] 70 y.o.  Admit date: 06/30/2015 Referring PhysicianKevin Ashok Cordia, MD Primary [place], MD Primary Cardiologist Dr. Angelena Form Reason for Consultation bradycardia  HPI: 70 year old woman with chronic renal insufficiency, hypertension and morbid obesity. It is difficult to get a detailed history from her. She states that yesterday evening, she heard some difficult news that her niece had to walk from [facility] to [facility] from work. This upset her. She felt her blood pressure go up. She called EMS. They came to her house but she did not go to the hospital. Then several hours later, which was early this morning, she felt worse. She had some nausea. She called EMS again and finally did come to the hospital. She was found to be hypertensive and bradycardic.  In 2016, she had episodes of junctional bradycardia as well. At that time, beta blocker was stopped. Clonidine was also stopped.  The patient walks with a walker. She requires help to get groceries and go shopping. She does not do much walking on a regular basis. She does no exercise. She has not noticed any worsening of her imbalance when she stands. Currently in the ER, while having orthostatic blood pressures checked, she reports a mild lightheadedness with standing. She has not had syncope. She does have a known seizure disorder. She has not passed out since her last seizure which she thinks was sometime in 2016.  When I asked her why she takes some of her medicines, she is unsure. She just knows that she takes a lot of pills for many different conditions. She has been out of a diabetes medicine of late and has to make an appointment with a doctor to get another supply.  Review of systems complete and found to be negative unless listed above   Past Medical History  Diagnosis Date  . Essential hypertension   . CKD (chronic  kidney disease), stage IV ([place])   . Hyperlipidemia   . GERD (gastroesophageal reflux disease)   . Morbid obesity ([place])   . Depression   . CHF (congestive heart failure) ([place])   . Type II diabetes mellitus ([place])   . Anemia   . Seizures ([place])     "epilepsy; they think I had one a couple months ago" (12/11/2014)    Family History  Problem Relation Age of Onset  . Hypertension Mother     died @ 45 - unknown cause.  [name] Hypertension Father     says she doesn't know anything about her father.  . Cancer Maternal Aunt   . Seizures Neg Hx     Social History   Social History  . Marital Status: Divorced    Spouse Name: N/A  . Number of Children: 2  . Years of Education: 10   Occupational History  . Retired    Social History Main Topics  . Smoking status: Never Smoker   . Smokeless tobacco: Never Used     Comment: EXPOSED TO 2ND HAND SMOKE X 20+ YEARS.   [name] Alcohol Use: No  . Drug Use: No  . Sexual Activity: No   Other Topics Concern  . Not on file   Social History Narrative   Lives in [place] by herself.  Daughters nearby.  Sedentary.   Eats fast food for at least one meal almost daily.   Caffeine use:  Drinks soda daily.   Coffee rarely.     Past  Surgical History  Procedure Laterality Date  . Tubal ligation    . Thrombectomy brachial artery Left 12/15/2014    Procedure: THROMBECTOMY BRACHIAL ARTERY; VEIN PATCH ANGIOPLASTY LEFT BRACIAL ARTERY;  Surgeon: Angelia Mould, MD;  Location: [place];  Service: Vascular;  Laterality: Left;      (Not in a hospital admission)  Physical Exam: Vitals:   Filed Vitals:   06/30/15 0640 06/30/15 0707 06/30/15 0741 06/30/15 0742  BP: 180/88 147/43 155/70   Pulse: 44 40 40 40  Temp: 98.6 F (37 C)     TempSrc: Oral     Resp: 18 16  11   SpO2: 99% 96% 95% 96%   I&O's:  No intake or output data in the 24 hours ending 06/30/15 [id number] Physical exam:  [place]/AT EOMI No JVD, No carotid bruit Bradycardic S1S2  No wheezing Soft. NT,  nondistended No edema. No focal motor or sensory deficits Flat affect Chronic skin changes on the lower extremities  Labs:   Lab Results  Component Value Date   WBC 5.9 06/30/2015   HGB 9.8* 06/30/2015   HCT 30.0* 06/30/2015   MCV 89.6 06/30/2015   PLT 209 06/30/2015    Recent Labs Lab 06/30/15 0652  NA 139  K 4.9  CL 112*  CO2 21*  BUN 41*  CREATININE 2.46*  CALCIUM 8.7*  PROT 6.4*  BILITOT 0.5  ALKPHOS 92  ALT 21  AST 19  GLUCOSE 185*   Lab Results  Component Value Date   CKTOTAL 91478* 12/17/2014   CKMB 16.6* 09/25/2012   TROPONINI 0.49* 12/12/2014    Lab Results  Component Value Date   CHOL 139 04/03/2010   CHOL 148 05/18/2007   Lab Results  Component Value Date   HDL 52 04/03/2010   HDL 63 05/18/2007   Lab Results  Component Value Date   LDLCALC 69 04/03/2010   LDLCALC 73 05/18/2007   Lab Results  Component Value Date   TRIG 92 04/03/2010   TRIG 62 05/18/2007   Lab Results  Component Value Date   CHOLHDL 2.7 Ratio 04/03/2010   CHOLHDL 2.3 Ratio 05/18/2007   No results found for: LDLDIRECT    Radiology:No acute disease on chest x-ray [id number] rhythm, no ST segment changes  ASSESSMENT AND PLAN:  Active Problems:   * No active hospital problems. *  Bradycardia: Junctional rhythm at this time. She is hemodynamically stable. Her blood pressure is actually quite high. She will need blood pressure medicines that do not slow her heart rate. Could continue hydralazine. Could also use IV nitroglycerin if needed. Given that she does not seem to have a good handle on her medications, I would like her to be observed for at least 24 hours. It would be nice of her family could bring in her medications that she has at home just to be sure she is not taking anything that would slow her rate. If her rate continues to be slow, will have to get electrophysiology consultation for possible pacemaker placement.  Anticoagulation: The patient is typically  on warfarin per home meds listed, although INR is normal.   Hypertension: As above.  Chronic renal insufficiency: Creatinine improved compared to prior. Signed:   Mina Marble, MD, [facility] 06/30/2015, 9:23 AM

## 2015-06-30 NOTE — ED Notes (Signed)
Cardiology at bedside.

## 2015-06-30 NOTE — Discharge Instructions (Addendum)
Bradycardia  Bradycardia is a slower-than-normal heart rate. A normal resting heart rate for an adult ranges from 60 to 100 beats per minute. With bradycardia, the resting heart rate is less than 60 beats per minute.  Bradycardia is a problem if your heart cannot pump enough oxygen-rich blood through your body. Bradycardia is not a problem for everyone. For some healthy adults, a slow resting heart rate is normal.   CAUSES   Bradycardia may be caused by:  · A problem with the heart's electrical system, such as heart block.  · A problem with the heart's natural pacemaker (sinus node).  · Heart disease, damage, or infection.  · Certain medicines that treat heart conditions.  · Certain conditions, such as hypothyroidism and obstructive sleep apnea.  RISK FACTORS   Risk factors include:  · Being 65 or older.  · Having high blood pressure (hypertension), high cholesterol (hyperlipidemia), or diabetes.  · Drinking heavily, using tobacco products, or using drugs.  · Being stressed.  SIGNS AND SYMPTOMS   Signs and symptoms include:  · Light-headedness.  · Fainting or near fainting.  · Fatigue and weakness.  · Shortness of breath.  · Chest pain (angina).  · Drowsiness.  · Confusion.  · Dizziness.  DIAGNOSIS   Diagnosis of bradycardia may include:  · A physical exam.  · An electrocardiogram (ECG).  · Blood tests.  TREATMENT   Treatment for bradycardia may include:  · Treatment of an underlying condition.  · Pacemaker placement. A pacemaker is a small, battery-powered device that is placed under the skin and is programmed to sense your heartbeats. If your heart rate is lower than the programmed rate, the pacemaker will pace your heart.  · Changing your medicines or dosages.  HOME CARE INSTRUCTIONS  · Take medicines only as directed by your health care provider.  · Manage any health conditions that contribute to bradycardia as directed by your health care provider.  · Follow a heart-healthy diet. A dietitian can help educate  you on healthy food options and changes.  · Follow an exercise program approved by your health care provider.  · Maintain a healthy weight. Lose weight as approved by your health care provider.  · Do not use tobacco products, including cigarettes, chewing tobacco, or electronic cigarettes. If you need help quitting, ask your health care provider.  · Do not use illegal drugs.  · Limit alcohol intake to no more than 1 drink per day for nonpregnant women and 2 drinks per day for men. One drink equals 12 ounces of beer, 5 ounces of wine, or 1½ ounces of hard liquor.  · Keep all follow-up visits as directed by your health care provider. This is important.  SEEK MEDICAL CARE IF:  · You feel light-headed or dizzy.  · You almost faint.  · You feel weak or are easily fatigued during physical activity.  · You experience confusion or have memory problems.  SEEK IMMEDIATE MEDICAL CARE IF:   · You faint.  · You have an irregular heartbeat.  · You have chest pain.  · You have trouble breathing.  MAKE SURE YOU:   · Understand these instructions.  · Will watch your condition.  · Will get help right away if you are not doing well or get worse.     This information is not intended to replace advice given to you by your health care provider. Make sure you discuss any questions you have with your health care provider.       Document Released: 10/03/2001 Document Revised: 02/01/2014 Document Reviewed: 04/18/2013  Elsevier Interactive Patient Education ©2016 Elsevier Inc.

## 2015-06-30 NOTE — Progress Notes (Signed)
ANTICOAGULATION CONSULT NOTE - Initial Consult  Pharmacy Consult for warfarin Indication: DVT hx  Allergies  Allergen Reactions  . Codeine Nausea And Vomiting    Patient Measurements:    Vital Signs: Temp: 98.6 F (37 C) (06/05 0640) Temp Source: Oral (06/05 0640) BP: 160/53 mmHg (06/05 1045) Pulse Rate: 44 (06/05 1045)  Labs:  Recent Labs  06/30/15 0652  HGB 9.8*  HCT 30.0*  PLT 209  LABPROT 15.2  INR 1.19  CREATININE 2.46*    CrCl cannot be calculated (Unknown ideal weight.).   Medical History: Past Medical History  Diagnosis Date  . Essential hypertension   . CKD (chronic kidney disease), stage IV ([place])   . Hyperlipidemia   . GERD (gastroesophageal reflux disease)   . Morbid obesity ([place])   . Depression   . CHF (congestive heart failure) ([place])   . Type II diabetes mellitus ([place])   . Anemia   . Seizures ([place])     "epilepsy; they think I had one a couple months ago" (12/11/2014)  . DVT (deep venous thrombosis) ([facility])     Assessment: 62 yof admitted w/ emesis/dizziness. Pt has a history of L arterial upper extremity embolism and underwent embolectomy in Nov 2016. From old vascular surgery notes, the MD still thinks she is on warfarin (as of Jan 2017). Pt is a poor historian and has poor compliance and is not that familiar with medications. Confirmed with outpatient pharmacy - has not filled warfarin since December.  Pharmacy consulted to resume warfarin for hx DVT. INR 1.19 on admit, Hg 9.8, plt wnl. No bleed documented.  Goal of Therapy:  INR 2-3 Monitor platelets by anticoagulation protocol: Yes   Plan:  Warfarin 7.5mg  x 1 dose tonight Daily INR Monitor s/sx bleeding  Elicia Lamp, PharmD, [facility] Clinical Pharmacist Pager [phone] 06/30/2015 11:42 AM

## 2015-07-01 DIAGNOSIS — R0609 Other forms of dyspnea: Secondary | ICD-10-CM | POA: Diagnosis not present

## 2015-07-01 DIAGNOSIS — R001 Bradycardia, unspecified: Secondary | ICD-10-CM | POA: Diagnosis not present

## 2015-07-01 DIAGNOSIS — I5032 Chronic diastolic (congestive) heart failure: Secondary | ICD-10-CM | POA: Diagnosis not present

## 2015-07-01 DIAGNOSIS — E785 Hyperlipidemia, unspecified: Secondary | ICD-10-CM | POA: Diagnosis not present

## 2015-07-01 DIAGNOSIS — I10 Essential (primary) hypertension: Secondary | ICD-10-CM | POA: Diagnosis not present

## 2015-07-01 DIAGNOSIS — E559 Vitamin D deficiency, unspecified: Secondary | ICD-10-CM | POA: Diagnosis not present

## 2015-07-01 DIAGNOSIS — N184 Chronic kidney disease, stage 4 (severe): Secondary | ICD-10-CM | POA: Diagnosis not present

## 2015-07-01 LAB — COMPREHENSIVE METABOLIC PANEL
ALT: 19 U/L (ref 14–54)
ANION GAP: 11 (ref 5–15)
AST: 18 U/L (ref 15–41)
Albumin: 3.4 g/dL — ABNORMAL LOW (ref 3.5–5.0)
Alkaline Phosphatase: 79 U/L (ref 38–126)
BILIRUBIN TOTAL: 1.2 mg/dL (ref 0.3–1.2)
BUN: 40 mg/dL — AB (ref 6–20)
CO2: 20 mmol/L — ABNORMAL LOW (ref 22–32)
Calcium: 9 mg/dL (ref 8.9–10.3)
Chloride: 107 mmol/L (ref 101–111)
Creatinine, Ser: 2.43 mg/dL — ABNORMAL HIGH (ref 0.44–1.00)
GFR calc Af Amer: 22 mL/min — ABNORMAL LOW (ref >60)
GFR, EST NON AFRICAN AMERICAN: 19 mL/min — AB (ref >60)
Glucose, Bld: 200 mg/dL — ABNORMAL HIGH (ref 65–99)
POTASSIUM: 4.7 mmol/L (ref 3.5–5.1)
Sodium: 138 mmol/L (ref 135–145)
TOTAL PROTEIN: 6.5 g/dL (ref 6.5–8.1)

## 2015-07-01 LAB — URINE CULTURE

## 2015-07-01 LAB — HEMOGLOBIN A1C
HEMOGLOBIN A1C: 5.4 % (ref 4.8–5.6)
Mean Plasma Glucose: 108 mg/dL

## 2015-07-01 LAB — GLUCOSE, CAPILLARY
GLUCOSE-CAPILLARY: 256 mg/dL — AB (ref 65–99)
GLUCOSE-CAPILLARY: 364 mg/dL — AB (ref 65–99)
GLUCOSE-CAPILLARY: 139 mg/dL — AB (ref 65–99)
Glucose-Capillary: 225 mg/dL — ABNORMAL HIGH (ref 65–99)

## 2015-07-01 LAB — CBC
HCT: 28.2 % — ABNORMAL LOW (ref 36.0–46.0)
HEMOGLOBIN: 9.4 g/dL — AB (ref 12.0–15.0)
MCH: 30.2 pg (ref 26.0–34.0)
MCHC: 33.3 g/dL (ref 30.0–36.0)
MCV: 90.7 fL (ref 78.0–100.0)
Platelets: 175 10*3/uL (ref 150–400)
RBC: 3.11 MIL/uL — ABNORMAL LOW (ref 3.87–5.11)
RDW: 12.5 % (ref 11.5–15.5)
WBC: 8.4 10*3/uL (ref 4.0–10.5)

## 2015-07-01 LAB — PROTIME-INR
INR: 1.18 (ref 0.00–1.49)
Prothrombin Time: 15.1 seconds (ref 11.6–15.2)

## 2015-07-01 MED ORDER — WARFARIN SODIUM 7.5 MG PO TABS
7.5000 mg | ORAL_TABLET | Freq: Once | ORAL | Status: AC
  Administered 2015-07-01: 7.5 mg via ORAL
  Filled 2015-07-01: qty 1

## 2015-07-01 NOTE — Progress Notes (Signed)
Patient ID: [name], female   DOB: 11/14/1945, 70 y.o.   MRN: [id number]    PROGRESS NOTE    SHAMARIA WERKING  [id number] DOB: 02-25-1945 DOA: 06/30/2015  PCP: Benito Mccreedy, MD   Brief Narrative:   70 year old woman with chronic renal insufficiency, hypertension and morbid obesity, presented for evaluation of generalized weakness, nausea, fatigue. In ED, she was found to be hypertensive and bradycardic.  Assessment & Plan:   Symptomatic Bradycardia - initial HR as low as in 30's - 40's - now up to 80's this AM  - has been using Metoprolol 25 mg PO BID and apparently this was stopped 7 months ago - appreciate cardiology team following - keep on tele for 24 more hours and possible d/c home in AM if HR stable   Nausea/vomiting - suspect from either bradycardia vs viral gastro.  - resolved   HTN, essential  - continue Irbesartan, Hydralazine, Lasix, Norvasc - continue same regimen for now    Seizures - continue vimpat and Carbamazepine   CKD, stage III - appears to be at baseline - BMP in AM  Diastolic CHF, chronic  -  last echo, 11/16 showing EF 55% and moderate concentric hypertrophy consistent w/ diastolic dysfunction. 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  DVT prophylaxis: on Coumadin  Code Status: Full  Family Communication: Patient at bedside, pt said she will update her family herself  Disposition Plan: Home possibly in AM if cardiology team OK with that  Consultants:   Cardiology   Procedures:    None  Antimicrobials:   None  Subjective: Reports feeling better, no chest pain or shortness of breath.  Objective: Filed Vitals:   06/30/15 2245 07/01/15 0223 07/01/15 0225 07/01/15 0438  BP: 179/51  186/52 168/45  Pulse: 70  81   Temp:   98.6 F (37 C)   TempSrc:   Oral   Resp:   18   Height:      Weight:  101.5 kg (223 lb 12.3 oz)    SpO2:   96%     Intake/Output Summary (Last 24 hours) at 07/01/15 0635 Last data filed at 07/01/15 0510  Gross per 24 hour  Intake      0 ml  Output   2125 ml  Net  -2125 ml   Filed Weights   06/30/15 1251 07/01/15 0223  Weight: 103.057 kg (227 lb 3.2 oz) 101.5 kg (223 lb 12.3 oz)    Examination:  General exam: Appears calm and comfortable  Respiratory system: Clear to auscultation. Respiratory effort normal. Cardiovascular system: S1 & S2 heard, RRR. No JVD, rubs, gallops or clicks. No pedal edema. Gastrointestinal system: Abdomen is nondistended, soft and nontender.  Central nervous system: Alert and oriented. No focal neurological deficits. Extremities: Symmetric 5 x 5 power.  Data Reviewed: I have personally reviewed following labs and imaging studies  CBC:  Recent Labs Lab 06/30/15 0652 07/01/15 0317  WBC 5.9 8.4  NEUTROABS 4.2  --   HGB 9.8*  9.4*  HCT 30.0* 28.2*  MCV 89.6 90.7  PLT 209 0000000   Basic Metabolic Panel:  Recent Labs Lab 06/30/15 0652 07/01/15 0317  NA 139 138  K 4.9 4.7  CL 112* 107  CO2 21* 20*  GLUCOSE 185* 200*  BUN 41* 40*  CREATININE 2.46* 2.43*  CALCIUM 8.7* 9.0  MG 2.0  --   PHOS 3.8  --    Liver Function Tests:  Recent Labs Lab 06/30/15 0652 07/01/15 0317  AST 19 18  ALT 21 19  ALKPHOS 92 79  BILITOT 0.5 1.2  PROT 6.4* 6.5  ALBUMIN 3.5 3.4*    Recent Labs Lab 06/30/15 0652  LIPASE 36   Coagulation Profile:  Recent Labs Lab 06/30/15 0652 07/01/15 0317  INR 1.19 1.18   HbA1C:  Recent Labs  06/30/15 0652  HGBA1C 5.4   CBG:  Recent Labs Lab  06/30/15 1237 06/30/15 1705 06/30/15 2100 07/01/15 0626  GLUCAP 89 149* 158* 256*   Urine analysis:    Component Value Date/Time   COLORURINE YELLOW 06/30/2015 [place] 06/30/2015 0939   LABSPEC 1.015 06/30/2015 0939   PHURINE 5.5 06/30/2015 [place] 06/30/2015 0939   HGBUR NEGATIVE 06/30/2015 0939   BILIRUBINUR NEGATIVE 06/30/2015 0939   KETONESUR NEGATIVE 06/30/2015 0939   PROTEINUR 100* 06/30/2015 0939   UROBILINOGEN 0.2 11/14/2014 1946   NITRITE NEGATIVE 06/30/2015 0939   LEUKOCYTESUR NEGATIVE 06/30/2015 0939   Radiology Studies: Dg Chest 2 View 06/30/2015 No acute disease.   Scheduled Meds: . amLODipine  10 mg Oral Daily  . aspirin  81 mg Oral Daily  . atorvastatin  20 mg Oral q1800  . calcitRIOL  0.25 mcg Oral Daily  . calcium acetate  667 mg Oral TID WC  . carbamazepine  200 mg Oral BID  . citalopram  20 mg Oral Daily  . ezetimibe  10 mg Oral Daily  . feeding supplement (ENSURE ENLIVE)  237 mL Oral BID BM  . furosemide  160 mg Oral BID  . hydrALAZINE  100 mg Oral Q8H  . insulin aspart  0-5 Units Subcutaneous QHS  . insulin aspart  0-9 Units Subcutaneous TID WC  . insulin aspart protamine- aspart  20 Units Subcutaneous Q breakfast  . irbesartan  150 mg Oral Daily  . lacosamide  100 mg Oral BID  . ondansetron (ZOFRAN) IV  4 mg Intravenous Once  . pantoprazole  80 mg Oral Daily  . polyethylene glycol  17 g Oral Daily  . senna  1 tablet Oral Daily  . sodium bicarbonate  1,300 mg Oral BID  . sodium chloride flush  3 mL Intravenous Q12H  . Warfarin - Pharmacist Dosing Inpatient   Does not apply q1800   Continuous Infusions: . sodium chloride 75 mL/hr at 07/01/15 0220   Time spent: 20 minutes   Faye Ramsay, MD [facility] Pager [phone]  If 7PM-7AM, please contact night-coverage [email or web address] Password [facility] 07/01/2015, 6:35 AM

## 2015-07-01 NOTE — Progress Notes (Addendum)
SUBJECTIVE:  Feels better  OBJECTIVE:   Vitals:   Filed Vitals:   06/30/15 2245 07/01/15 0223 07/01/15 0225 07/01/15 0438  BP: 179/51  186/52 168/45  Pulse: 70  81   Temp:   98.6 F (37 C)   TempSrc:   Oral   Resp:   18   Height:      Weight:  223 lb 12.3 oz (101.5 kg)    SpO2:   96%    I&O's:   Intake/Output Summary (Last 24 hours) at 07/01/15 1202 Last data filed at 07/01/15 0510  Gross per 24 hour  Intake      0 ml  Output   2125 ml  Net  -2125 ml   TELEMETRY: Reviewed telemetry pt in NSR:     PHYSICAL EXAM General: Well developed, well nourished, in no acute distress Head:   Normal cephalic and atramatic  Lungs:   Clear bilaterally to auscultation. Heart:   HRRR S1 S2  No JVD.   Abdomen: abdomen soft and non-tender Msk:  Back normal,  Normal strength and tone for age. Extremities:   tr edema.   Neuro: Alert and oriented. Psych:  Normal affect, responds appropriately Skin: No rash   LABS: Basic Metabolic Panel:  Recent Labs  06/30/15 0652 07/01/15 0317  NA 139 138  K 4.9 4.7  CL 112* 107  CO2 21* 20*  GLUCOSE 185* 200*  BUN 41* 40*  CREATININE 2.46* 2.43*  CALCIUM 8.7* 9.0  MG 2.0  --   PHOS 3.8  --    Liver Function Tests:  Recent Labs  06/30/15 0652 07/01/15 0317  AST 19 18  ALT 21 19  ALKPHOS 92 79  BILITOT 0.5 1.2  PROT 6.4* 6.5  ALBUMIN 3.5 3.4*    Recent Labs  06/30/15 0652  LIPASE 36   CBC:  Recent Labs  06/30/15 0652 07/01/15 0317  WBC 5.9 8.4  NEUTROABS 4.2  --   HGB 9.8* 9.4*  HCT 30.0* 28.2*  MCV 89.6 90.7  PLT 209 175   Cardiac Enzymes: No results for input(s): CKTOTAL, CKMB, CKMBINDEX, TROPONINI in the last 72 hours. BNP: Invalid input(s): POCBNP D-Dimer: No results for input(s): DDIMER in the last 72 hours. Hemoglobin A1C:  Recent Labs  06/30/15 0652  HGBA1C 5.4   Fasting Lipid Panel: No results for input(s): CHOL, HDL, LDLCALC, TRIG, CHOLHDL, LDLDIRECT in the last 72 hours. Thyroid  Function Tests: No results for input(s): TSH, T4TOTAL, T3FREE, THYROIDAB in the last 72 hours.  Invalid input(s): FREET3 Anemia Panel: No results for input(s): VITAMINB12, FOLATE, FERRITIN, TIBC, IRON, RETICCTPCT in the last 72 hours. Coag Panel:   Lab Results  Component Value Date   INR 1.18 07/01/2015   INR 1.19 06/30/2015   INR 1.06 12/21/2014    RADIOLOGY: Dg Chest 2 View  06/30/2015  CLINICAL DATA:  Nausea, vomiting and bradycardia beginning last night. Initial encounter. EXAM: CHEST  2 VIEW COMPARISON:  PA and lateral chest 12/14/2014. FINDINGS: The lungs are clear. Heart size is upper normal. No pneumothorax or pleural effusion. Asymmetric elevation of the right hemidiaphragm relative to the left is unchanged. IMPRESSION: No acute disease. Electronically Signed   By: Inge Rise M.D.   On: 06/30/2015 07:35      ASSESSMENT: / PLAN:   1) Bradycardia: resolved.  Now off of metoprolol.  It appears she was taking this at home when she was not supposed to per the residents notes.  No indication for pacer.  2) DOE: She needs more activity to try to improve her stamina.    CKD, obesity.  Jettie Booze, MD  07/01/2015  12:02 PM

## 2015-07-01 NOTE — Progress Notes (Signed)
[place] for warfarin Indication: DVT hx  Allergies  Allergen Reactions  . Codeine Nausea And Vomiting    Patient Measurements: Height: 5\' 2"  (157.5 cm) Weight: 223 lb 12.3 oz (101.5 kg) IBW/kg (Calculated) : 50.1  Vital Signs: Temp: 98.6 F (37 C) (06/06 0225) Temp Source: Oral (06/06 0225) BP: 168/45 mmHg (06/06 0438) Pulse Rate: 81 (06/06 0225)  Labs:  Recent Labs  06/30/15 0652 07/01/15 0317  HGB 9.8* 9.4*  HCT 30.0* 28.2*  PLT 209 175  LABPROT 15.2 15.1  INR 1.19 1.18  CREATININE 2.46* 2.43*    Estimated Creatinine Clearance: 24.7 mL/min (by C-G formula based on Cr of 2.43).   Medical History: Past Medical History  Diagnosis Date  . Essential hypertension   . CKD (chronic kidney disease), stage IV ([place])   . Hyperlipidemia   . GERD (gastroesophageal reflux disease)   . Morbid obesity ([place])   . Depression   . CHF (congestive heart failure) ([place])   . Type II diabetes mellitus ([place])   . Anemia   . Seizures ([place])     "epilepsy; they think I had one a couple months ago" (12/11/2014)  . DVT (deep venous thrombosis) ([facility])     Assessment: 63 yof admitted w/ emesis/dizziness. Pt has a history of L arterial upper extremity embolism and underwent embolectomy in Nov 2016. From old vascular surgery notes, the MD still thinks she is on warfarin (as of Jan 2017). Pt is a poor historian and has poor compliance and is not that familiar with medications. Confirmed with outpatient pharmacy - has not filled warfarin since December.  Pharmacy consulted to resume warfarin for hx DVT. INR 1.19 on admit, now 1.18 after 1 warfarin dose. Hg low stable, plt wnl. No bleed documented.  Goal of Therapy:  INR 2-3 Monitor platelets by anticoagulation protocol: Yes   Plan:  Warfarin 7.5mg  x 1 dose tonight Daily INR Monitor s/sx bleeding  Elicia Lamp, PharmD, [facility] Clinical Pharmacist Pager [phone] 07/01/2015 9:39 AM

## 2015-07-01 NOTE — Progress Notes (Deleted)
Patient Name: [name] Date of Encounter: 07/01/2015  Primary Cardiologist: Dr. Angelena Form   Patient profile:  70 year old woman with chronic renal insufficiency, hypertension and morbid obesity presented with nausea, found to be hypertensive and bradycardic. BB stopped due to junctional rhythm.   Principal Problem:   Bradycardia Active Problems:   Hyperlipidemia   Morbid obesity (HCC)   Seizure (HCC)   Vomiting   CKD (chronic kidney disease), stage IV (HCC)   Essential hypertension   Type II diabetes mellitus (HCC)   DVT (deep venous thrombosis) (HCC)   Chronic diastolic (congestive) heart failure (HCC)    SUBJECTIVE  Denies any CP or SOB. No more nausea  CURRENT MEDS . amLODipine  10 mg Oral Daily  . aspirin  81 mg Oral Daily  . atorvastatin  20 mg Oral q1800  . calcitRIOL  0.25 mcg Oral Daily  . calcium acetate  667 mg Oral TID WC  . carbamazepine  200 mg Oral BID  . citalopram  20 mg Oral Daily  . ezetimibe  10 mg Oral Daily  . feeding supplement (ENSURE ENLIVE)  237 mL Oral BID BM  . furosemide  160 mg Oral BID  . hydrALAZINE  100 mg Oral Q8H  . insulin aspart  0-5 Units Subcutaneous QHS  . insulin aspart  0-9 Units Subcutaneous TID WC  . insulin aspart protamine- aspart  20 Units Subcutaneous Q breakfast  . irbesartan  150 mg Oral Daily  . lacosamide  100 mg Oral BID  . pantoprazole  80 mg Oral Daily  . polyethylene glycol  17 g Oral Daily  . senna  1 tablet Oral Daily  . sodium bicarbonate  1,300 mg Oral BID  . sodium chloride flush  3 mL Intravenous Q12H  . warfarin  7.5 mg Oral ONCE-1800  . Warfarin - Pharmacist Dosing Inpatient   Does not apply q1800    OBJECTIVE  Filed Vitals:   06/30/15 2245 07/01/15 0223 07/01/15 0225 07/01/15 0438  BP: 179/51  186/52 168/45  Pulse: 70  81   Temp:   98.6 F (37 C)   TempSrc:   Oral   Resp:   18   Height:      Weight:  223 lb 12.3 oz (101.5 kg)    SpO2:   96%     Intake/Output Summary (Last 24  hours) at 07/01/15 1142 Last data filed at 07/01/15 0510  Gross per 24 hour  Intake      0 ml  Output   2125 ml  Net  -2125 ml   Filed Weights   06/30/15 1251 07/01/15 0223  Weight: 227 lb 3.2 oz (103.057 kg) 223 lb 12.3 oz (101.5 kg)    PHYSICAL EXAM  General: Pleasant, NAD. Neuro: Alert and oriented X 3. Moves all extremities spontaneously. Psych: Normal affect. HEENT:  Normal  Neck: Supple without bruits or JVD. Lungs:  Resp regular and unlabored, CTA. Heart: RRR no s3, s4, or murmurs. Abdomen: Soft, non-tender, non-distended, BS + x 4.  Extremities: No clubbing, cyanosis or edema. DP/PT/Radials 2+ and equal bilaterally.  Accessory Clinical Findings  CBC  Recent Labs  06/30/15 0652 07/01/15 0317  WBC 5.9 8.4  NEUTROABS 4.2  --   HGB 9.8* 9.4*  HCT 30.0* 28.2*  MCV 89.6 90.7  PLT 209 0000000   Basic Metabolic Panel  Recent Labs  06/30/15 0652 07/01/15 0317  NA 139 138  K 4.9 4.7  CL 112* 107  CO2 21* 20*  GLUCOSE 185* 200*  BUN 41* 40*  CREATININE 2.46* 2.43*  CALCIUM 8.7* 9.0  MG 2.0  --   PHOS 3.8  --    Liver Function Tests  Recent Labs  06/30/15 0652 07/01/15 0317  AST 19 18  ALT 21 19  ALKPHOS 92 79  BILITOT 0.5 1.2  PROT 6.4* 6.5  ALBUMIN 3.5 3.4*    Recent Labs  06/30/15 0652  LIPASE 36   Hemoglobin A1C  Recent Labs  06/30/15 0652  HGBA1C 5.4    TELE Junctional bradycardia with HR 40s yesterday, improved to 70s today    ECG  No new EKG  Echocardiogram 12/11/2014  LV EF: 55% - 60%  ------------------------------------------------------------------- Indications: Dyspnea 786.09.  ------------------------------------------------------------------- History: PMH: Bradycardia. Morbid obesity. Renal failure. Risk factors: Hypertension. Diabetes mellitus. Dyslipidemia.  ------------------------------------------------------------------- Study Conclusions  - Left ventricle: The cavity size was normal.  Wall thickness was  increased in a pattern of moderate LVH. Systolic function was  normal. The estimated ejection fraction was in the range of 55%  to 60%. Wall motion was normal; there were no regional wall  motion abnormalities. - Aortic valve: Valve area (VTI): 1.97 cm^2. Valve area (Vmax):  1.77 cm^2. Valve area (Vmean): 2.09 cm^2. - Left atrium: The atrium was mildly dilated    Radiology/Studies  Dg Chest 2 View  06/30/2015  CLINICAL DATA:  Nausea, vomiting and bradycardia beginning last night. Initial encounter. EXAM: CHEST  2 VIEW COMPARISON:  PA and lateral chest 12/14/2014. FINDINGS: The lungs are clear. Heart size is upper normal. No pneumothorax or pleural effusion. Asymmetric elevation of the right hemidiaphragm relative to the left is unchanged. IMPRESSION: No acute disease. Electronically Signed   By: Inge Rise M.D.   On: 06/30/2015 07:35    ASSESSMENT AND PLAN  1. Hypertensive urgency  - continue amlodipine 10mg , hydralazine 100mg  TID, irbesartan 150mg  daily. BP still high, consider increase irbesartan to 300mg  . If still high, add Imdur 60mg  daily. Otherwise, no further cardiology recommendation.   2. Junctional bradycardia  - note in Nov 2016, he had bradycardia, BB and clonidine discontinued. But metoprolol still listed as a home med.   - I have reviewed her home meds which she brought with her, only rate control med is Toprol XL which will be stopped.  - HR improved, now in 70s, however still can't see p wave on telemetry, will obtain EKG to make sure it is not afib, otherwise no further workup.  3. Upper extremity arterial occlusion s/p L brachial embolectomy with vein patch angioplasty using cephalic vein 0000000  - on coumadin at home, question duration, original discharge note on 12/21/2015 says will need at least 3 month of anticoagulation. Felt embolism may be due to endothelial dysfunction.   - interestingly her INR is normal 1.18, question if she  is actually taking the med. Again need to discuss with his vascular surgeon on when to stop coumadin. She may not need it anymore  - Also he is on ASA, but does not carry diagnosis of CAD, if she needs coumadin, then stop ASA, she she can stop coumadin, then just continue ASA.  4. HLD: on Vytorin  5. DM II: she is still on glipizide 5mg , will defer to hospitalist regarding renal adjustment.  6. CKD stage IV: stable   Signed, Almyra Deforest PA-C Pager: [phone]

## 2015-07-01 NOTE — Progress Notes (Signed)
Nutrition Brief Note  Patient identified on the Malnutrition Screening Tool (MST) Report.  Wt Readings from Last 15 Encounters:  07/01/15 223 lb 12.3 oz (101.5 kg)  01/29/15 240 lb (108.863 kg)  01/08/15 237 lb (107.502 kg)  12/21/14 237 lb 4.8 oz (107.639 kg)  11/22/14 241 lb 12.8 oz (109.68 kg)  07/24/14 250 lb 1.6 oz (113.445 kg)  11/14/12 230 lb (104.327 kg)  10/02/12 255 lb 12.8 oz (116.03 kg)    Body mass index is 40.92 kg/(m^2). Patient meets criteria for Obesity Class III based on current BMI.   Current diet order is Heart Healthy.  Labs and medications reviewed.   No nutrition interventions warranted at this time. If nutrition issues arise, please consult RD.   Arthur Holms, RD, LDN Pager #: [phone] After-Hours Pager #: [phone]

## 2015-07-01 NOTE — Care Management Obs Status (Signed)
[place] NOTIFICATION   Patient Details  Name: [name] MRN: [id number] Date of Birth: [birth year]   Medicare Observation Status Notification Given:  Yes    Dawayne Patricia, RN 07/01/2015, 3:36 PM

## 2015-07-02 DIAGNOSIS — N184 Chronic kidney disease, stage 4 (severe): Secondary | ICD-10-CM | POA: Diagnosis not present

## 2015-07-02 DIAGNOSIS — R001 Bradycardia, unspecified: Secondary | ICD-10-CM | POA: Diagnosis not present

## 2015-07-02 DIAGNOSIS — I5032 Chronic diastolic (congestive) heart failure: Secondary | ICD-10-CM | POA: Diagnosis not present

## 2015-07-02 DIAGNOSIS — I10 Essential (primary) hypertension: Secondary | ICD-10-CM | POA: Diagnosis not present

## 2015-07-02 DIAGNOSIS — D638 Anemia in other chronic diseases classified elsewhere: Secondary | ICD-10-CM | POA: Diagnosis not present

## 2015-07-02 LAB — GLUCOSE, CAPILLARY
GLUCOSE-CAPILLARY: 88 mg/dL (ref 65–99)
GLUCOSE-CAPILLARY: 112 mg/dL — AB (ref 65–99)
Glucose-Capillary: 88 mg/dL (ref 65–99)

## 2015-07-02 LAB — CBC
HCT: 26.7 % — ABNORMAL LOW (ref 36.0–46.0)
Hemoglobin: 9 g/dL — ABNORMAL LOW (ref 12.0–15.0)
MCH: 30.3 pg (ref 26.0–34.0)
MCHC: 33.7 g/dL (ref 30.0–36.0)
MCV: 89.9 fL (ref 78.0–100.0)
PLATELETS: 160 10*3/uL (ref 150–400)
RBC: 2.97 MIL/uL — AB (ref 3.87–5.11)
RDW: 12.5 % (ref 11.5–15.5)
WBC: 6.6 10*3/uL (ref 4.0–10.5)

## 2015-07-02 LAB — BASIC METABOLIC PANEL
Anion gap: 11 (ref 5–15)
BUN: 52 mg/dL — AB (ref 6–20)
CO2: 21 mmol/L — ABNORMAL LOW (ref 22–32)
CREATININE: 3.12 mg/dL — AB (ref 0.44–1.00)
Calcium: 9 mg/dL (ref 8.9–10.3)
Chloride: 104 mmol/L (ref 101–111)
GFR calc Af Amer: 17 mL/min — ABNORMAL LOW (ref >60)
GFR, EST NON AFRICAN AMERICAN: 14 mL/min — AB (ref >60)
GLUCOSE: 98 mg/dL (ref 65–99)
POTASSIUM: 4.9 mmol/L (ref 3.5–5.1)
Sodium: 136 mmol/L (ref 135–145)

## 2015-07-02 LAB — PROTIME-INR
INR: 1.47 (ref 0.00–1.49)
PROTHROMBIN TIME: 17.9 s — AB (ref 11.6–15.2)

## 2015-07-02 MED ORDER — ENSURE ENLIVE PO LIQD: 237.0000 mL | Freq: Two times a day (BID) | ORAL | Status: DC

## 2015-07-02 MED ORDER — FUROSEMIDE 80 MG PO TABS
160.0000 mg | ORAL_TABLET | Freq: Two times a day (BID) | ORAL | Status: DC
  Administered 2015-07-02: 160 mg via ORAL
  Filled 2015-07-02: qty 2

## 2015-07-02 NOTE — Progress Notes (Signed)
Physical Therapy Evaluation Addendum for G-Codes    Aug 01, 2015 1449  PT G-Codes **NOT FOR INPATIENT CLASS**  Functional Assessment Tool Used Clinical judgement  Functional Limitation Mobility: Walking and moving around  Mobility: Walking and Moving Around Current Status [phone]) CI  Mobility: Walking and Moving Around Goal Status [id number]) CI   Rolinda Roan, PT, DPT Acute Rehabilitation Services Pager: [phone]

## 2015-07-02 NOTE — Evaluation (Signed)
Physical Therapy Evaluation Patient Details Name: [name] MRN: [id number] DOB: Jan 19, 1946 Today's Date: 07/02/2015   History of Present Illness  Pt is a 70 y/o female who presents with 1 day history of nausea/vomiting and 1 episode of hewart palpitations. Upon workup, pt was found to be bradycardic with HR in the 40's - now resolved per MD notes. PMH significant for HTN, CKD, CHF, DM.  Clinical Impression  Pt admitted with above diagnosis. Pt currently with functional limitations due to the deficits listed below (see PT Problem List). At the time of PT eval pt was able to perform transfers and ambulation with gross min guard assist. Pt's tolerance for functional activity is low, and recommend HHPT to follow for walking program/HEP initiation. Pt agreeable to this. Pt will benefit from skilled PT to increase their independence and safety with mobility to allow discharge to the venue listed below.       Follow Up Recommendations Home health PT;Supervision for mobility/OOB    Equipment Recommendations  Rolling walker with 5" wheels    Recommendations for Other Services       Precautions / Restrictions Precautions Precautions: Fall Restrictions Weight Bearing Restrictions: No      Mobility  Bed Mobility               General bed mobility comments: Pt received sitting up in the recliner.  Transfers Overall transfer level: Needs assistance Equipment used: Rolling walker (2 wheeled) Transfers: Sit to/from Stand Sit to Stand: Min guard         General transfer comment: Close guard for safety. Pt was able to power-up to full standing without assistance.   Ambulation/Gait Ambulation/Gait assistance: Min guard;Supervision Ambulation Distance (Feet): 75 Feet (x2) Assistive device: Rolling walker (2 wheeled) Gait Pattern/deviations: Step-through pattern;Decreased stride length;Trendelenburg Gait velocity: Decreased Gait velocity interpretation: Below normal speed for  age/gender General Gait Details: Grossly min guard assist with occasional supervision only. Pt ambulated ~75 feet x2 to check O2 saturations during ambulation. Sats decreased from 97% to 90% during second bout of ambulation. Pt reports DOE and feeling moreso SOB than at her baseline. Was not able to rate DOE when asked.   Stairs            Wheelchair Mobility    Modified Rankin (Stroke Patients Only)       Balance Overall balance assessment: Needs assistance Sitting-balance support: Feet supported;No upper extremity supported Sitting balance-Leahy Scale: Fair     Standing balance support: No upper extremity supported;During functional activity Standing balance-Leahy Scale: Fair Standing balance comment: Pt was able to mobilize to the [facility] without RW, however therapist held to arm for support. Pt was able to perform peri-care without UE support.                              Pertinent Vitals/Pain Pain Assessment: Faces Faces Pain Scale: No hurt    Home Living Family/patient expects to be discharged to:: Private residence Living Arrangements: Alone Available Help at Discharge: Personal care attendant Type of Home: Apartment Home Access: Level entry     Home Layout: One level Home Equipment: Walker - 2 wheels;Tub bench Additional Comments: 7 days a week 2 hours a day    Prior Function Level of Independence: Needs assistance   Gait / Transfers Assistance Needed: uses a RW at baseline. Motorized cart when out at the store  ADL's / Homemaking Assistance Needed: Aide assists with washing  if pt sitting on tub bench however pt states she can also wash herself up at the sink.         Hand Dominance   Dominant Hand: Right    Extremity/Trunk Assessment   Upper Extremity Assessment: Defer to OT evaluation           Lower Extremity Assessment: Generalized weakness (Grossly 4-/5 strength in BLE's)      Cervical / Trunk Assessment: Other exceptions   Communication   Communication: No difficulties  Cognition Arousal/Alertness: Awake/alert Behavior During Therapy: WFL for tasks assessed/performed Overall Cognitive Status: Within Functional Limits for tasks assessed                      General Comments      Exercises        Assessment/Plan    PT Assessment Patient needs continued PT services  PT Diagnosis Difficulty walking;Generalized weakness   PT Problem List Decreased strength;Decreased range of motion;Decreased activity tolerance;Decreased balance;Decreased mobility;Decreased knowledge of use of DME;Decreased safety awareness;Decreased knowledge of precautions;Cardiopulmonary status limiting activity;Obesity  PT Treatment Interventions DME instruction;Gait training;Stair training;Functional mobility training;Therapeutic activities;Therapeutic exercise;Neuromuscular re-education;Patient/family education   PT Goals (Current goals can be found in the Care Plan section) Acute Rehab PT Goals Patient Stated Goal: Return home today PT Goal Formulation: With patient Time For Goal Achievement: 07/09/15 Potential to Achieve Goals: Good    Frequency Min 3X/week   Barriers to discharge Decreased caregiver support Pt lives alone with aide 2 hours/day    Co-evaluation               End of Session Equipment Utilized During Treatment: Gait belt Activity Tolerance: Patient limited by fatigue Patient left: in chair;with call bell/phone within reach Nurse Communication: Mobility status         Time: 1355-1425 PT Time Calculation (min) (ACUTE ONLY): 30 min   Charges:   PT Evaluation $PT Eval Moderate Complexity: 1 Procedure PT Treatments $Gait Training: 8-22 mins   PT G Codes:        [name] 2015/07/03, 2:48 PM   [name], PT, DPT Acute Rehabilitation Services Pager: [phone]

## 2015-07-02 NOTE — Discharge Summary (Signed)
Physician Discharge Summary  [name] [id number] DOB: [birth year] DOA: 06/30/2015  PCP: Benito Mccreedy, MD  Admit date: 06/30/2015 Discharge date: 07/02/2015  Recommendations for Outpatient Follow-up:  Please do not take metoprolol. On medical reconciliation form, metoprolol not listed so if there is any left over metoprolol at home it should not be taken. You may continue all other medications per home dose.  Discharge Diagnoses:  Principal Problem:   Bradycardia Active Problems:   Hyperlipidemia   Morbid obesity (HCC)   Seizure (HCC)   Vomiting   CKD (chronic kidney disease), stage IV (HCC)   Essential hypertension   Type II diabetes mellitus (HCC)   DVT (deep venous thrombosis) (HCC)   Chronic diastolic (congestive) heart failure ([place])    Discharge Condition: stable   Diet recommendation: as tolerated   History of present illness:  70 year old woman with chronic renal insufficiency, hypertension and morbid obesity, presented for evaluation of generalized weakness, nausea, fatigue.   In ED, ptwas found to be hypertensive and bradycardic. Patient was seen by cardiology in consultation. Patient was taking metoprolol at home even though she was instructed that she should not be taking that medication. Most likely metoprolol has caused bradycardia. Cardiology signed off with recommendation not to take metoprolol.  Hospital Course:    Assessment & Plan:  Symptomatic Bradycardia - On admission HR as low as in 30's - 40's - Patient currently not on beta blocker and heart rate within normal limits - Patient does not feel lightheaded or dizzy - Physical therapy will see the patient prior to discharge -No further recommendation per cardiology other than holding metoprolol and not resuming it on discharge    Nausea/vomiting - Likely due to bradycardia - Now resolved - Eating breakfast, no nausea or vomiting  HTN, essential  - Continue current home blood pressure  medications  Seizures - Continue vimpat and Carbamazepine  - No reports of seizures in hospital   CKD, stage III - Baseline creatinine about 3.5 - Creatinine on this admission at baseline values, 3.12 prior to discharge  Diastolic CHF, chronic  -2-D echo in 11/2014 showed EF 55% and moderate concentric hypertrophy consistent w/ diastolic dysfunction. - Continue current Lasix - Potassium WNL, 4.9  Anemia of chronic disease, renal disease - Hemoglobin stable  Controlled type 2 diabetes mellitus with diabetic nephropathy on long-term insulin use - A1C 5.4 - Continue glipizide and home insulin regimen   DVT L brachial in 2016 - Please note that patient has not taken Coumadin since 2016. Coumadin not started during this hospital stay.   Dyslipidemia associated with type 2 diabetes mellitus - Continue Vytorin  GERD - Continue PPI  Depression - Continue Celexa - Stable, not depressed   Morbid obesity due to excess calories - Body mass index is 40.92 kg/(m^2). - Counseled on diet   DVT prophylaxis: on Coumadin  Code Status: Full  Family Communication:  called patient's daughter [name], left message on her voicemail cell [phone]. Left message the patient is medically stable for discharge after she is seen by physical therapy. I left my cell phone number for her to call me back for any questions or concerns.   Consultants:   Cardiology  Procedures:   None  Antimicrobials:   None   Signed:  Leisa Lenz, MD  [facility] 07/02/2015, 10:30 AM  Pager #: [phone]  Time spent in minutes: more than 30 minutes    Discharge Exam: Filed Vitals:   07/01/15 2104 07/02/15 0434  BP: 142/52 156/53  Martin: 84 74  Temp:  98.6 F (37 C)  Resp: 18 18   Filed Vitals:   07/01/15 0438 07/01/15 1831 07/01/15 2104 07/02/15 0434  BP: 168/45 148/51 142/52 156/53  Martin:  75 84 74  Temp:  98.2 F (36.8 C)  98.6 F (37 C)  TempSrc:  Oral  Oral  Resp:    18 18  Height:      Weight:    104.872 kg (231 lb 3.2 oz)  SpO2:  100% 92% 86%    General: Pt is alert, follows commands appropriately, not in acute distress Cardiovascular: Regular rate and rhythm, S1/S2 appreciated  Respiratory: Clear to auscultation bilaterally, no wheezing, no crackles, no rhonchi Abdominal: Soft, non tender, non distended, bowel sounds +, no guarding Extremities: no edema, no cyanosis, pulses palpable bilaterally DP and PT Neuro: Grossly nonfocal  Discharge Instructions  Discharge Instructions    Call MD for:  difficulty breathing, headache or visual disturbances    Complete by:  As directed      Call MD for:  persistant dizziness or light-headedness    Complete by:  As directed      Call MD for:  persistant nausea and vomiting    Complete by:  As directed      Call MD for:  severe uncontrolled pain    Complete by:  As directed      Diet - low sodium heart healthy    Complete by:  As directed      Discharge instructions    Complete by:  As directed   Please do not take metoprolol. On medical reconciliation form, metoprolol not listed so if there is any left over metoprolol at home it should not be taken. You may continue all other medications per home dose.     Increase activity slowly    Complete by:  As directed             Medication List    STOP taking these medications        albuterol (2.5 MG/3ML) 0.083% nebulizer solution  Commonly known as:  PROVENTIL     warfarin 5 MG tablet  Commonly known as:  COUMADIN      TAKE these medications        amlodipine-olmesartan 10-20 MG tablet  Commonly known as:  AZOR  Take 1 tablet by mouth daily.     aspirin 81 MG chewable tablet  Chew 1 tablet (81 mg total) by mouth daily.     bisacodyl 5 MG EC tablet  Commonly known as:  DULCOLAX  Take 2 tablets (10 mg total) by mouth every morning.     calcitRIOL 0.25 MCG capsule  Commonly known as:  ROCALTROL  Take 1 capsule (0.25 mcg total) by mouth  daily.     calcium acetate 667 MG capsule  Commonly known as:  PHOSLO  Take 1 capsule (667 mg total) by mouth 3 (three) times daily with meals.     carbamazepine 200 MG tablet  Commonly known as:  TEGRETOL  Take 200 mg by mouth 2 (two) times daily.     citalopram 20 MG tablet  Commonly known as:  CELEXA  Take 20 mg by mouth daily.     clobetasol cream 0.05 %  Commonly known as:  TEMOVATE  Apply 1 application topically 2 (two) times daily as needed (for legs).     docusate sodium 100 MG capsule  Commonly known as:  COLACE  Take 2 capsules (200  mg total) by mouth 2 (two) times daily.     ezetimibe-simvastatin 10-40 MG tablet  Commonly known as:  VYTORIN  Take 1 tablet by mouth daily.     feeding supplement (ENSURE ENLIVE) Liqd  Take 237 mLs by mouth 2 (two) times daily between meals.     furosemide 80 MG tablet  Commonly known as:  LASIX  Take 2 tablets (160 mg total) by mouth 2 (two) times daily.     glipiZIDE 5 MG 24 hr tablet  Commonly known as:  GLUCOTROL XL  Take 5 mg by mouth daily with breakfast.     HUMULIN 70/30 KWIKPEN (70-30) 100 UNIT/ML PEN  Generic drug:  Insulin Isophane & Regular Human  Inject 20 Units into the skin daily.     hydrALAZINE 100 MG tablet  Commonly known as:  APRESOLINE  Take 1 tablet (100 mg total) by mouth every 8 (eight) hours.     omeprazole 40 MG capsule  Commonly known as:  PRILOSEC  Take 40 mg by mouth daily.     sodium bicarbonate 650 MG tablet  Take 2 tablets (1,300 mg total) by mouth 2 (two) times daily.     VIMPAT 100 MG Tabs  Generic drug:  Lacosamide  Take 1 tablet by mouth 2 (two) times daily.     VITAMIN C PO  Take 1 tablet by mouth daily.     Vitamin D-3 1000 units Caps  Take 1,000 Units by mouth daily at 12 noon.           Follow-up Information    Follow up with [name],[name], MD. Schedule an appointment as soon as possible for a visit in 1 week.   Specialty:  Internal Medicine   Why:  Follow up appt  after recent hospitalization   Contact information:   [id number] [place] [id number] [phone]        The results of significant diagnostics from this hospitalization (including imaging, microbiology, ancillary and laboratory) are listed below for reference.    Significant Diagnostic Studies: Dg Chest 2 View  06/30/2015  CLINICAL DATA:  Nausea, vomiting and bradycardia beginning last night. Initial encounter. EXAM: CHEST  2 VIEW COMPARISON:  PA and lateral chest 12/14/2014. FINDINGS: The lungs are clear. Heart size is upper normal. No pneumothorax or pleural effusion. Asymmetric elevation of the right hemidiaphragm relative to the left is unchanged. IMPRESSION: No acute disease. Electronically Signed   By: Inge Rise M.D.   On: 06/30/2015 07:35    Microbiology: Recent Results (from the past 240 hour(s))  Urine culture     Status: Abnormal   Collection Time: 06/30/15  9:39 AM  Result Value Ref Range Status   Specimen Description URINE, RANDOM  Final   Special Requests NONE  Final   Culture MULTIPLE SPECIES PRESENT, SUGGEST RECOLLECTION (A)  Final   Report Status 07/01/2015 FINAL  Final     Labs: Basic Metabolic Panel:  Recent Labs Lab 06/30/15 0652 07/01/15 0317 07/02/15 0215  NA 139 138 136  K 4.9 4.7 4.9  CL 112* 107 104  CO2 21* 20* 21*  GLUCOSE 185* 200* 98  BUN 41* 40* 52*  CREATININE 2.46* 2.43* 3.12*  CALCIUM 8.7* 9.0 9.0  MG 2.0  --   --   PHOS 3.8  --   --    Liver Function Tests:  Recent Labs Lab 06/30/15 0652 07/01/15 0317  AST 19 18  ALT 21 19  ALKPHOS 92 79  BILITOT 0.5 1.2  PROT 6.4* 6.5  ALBUMIN 3.5 3.4*    Recent Labs Lab 06/30/15 0652  LIPASE 36   No results for input(s): AMMONIA in the last 168 hours. CBC:  Recent Labs Lab 06/30/15 0652 07/01/15 0317 07/02/15 0215  WBC 5.9 8.4 6.6  NEUTROABS 4.2  --   --   HGB 9.8* 9.4* 9.0*  HCT 30.0* 28.2* 26.7*  MCV 89.6 90.7 89.9  PLT 209 175 160   Cardiac  Enzymes: No results for input(s): CKTOTAL, CKMB, CKMBINDEX, TROPONINI in the last 168 hours. BNP: BNP (last 3 results)  Recent Labs  12/11/14 1129 12/12/14 0838 12/13/14 0612  BNP 1398.1* 2226.5* 1254.1*    ProBNP (last 3 results) No results for input(s): PROBNP in the last 8760 hours.  CBG:  Recent Labs Lab 07/01/15 0626 07/01/15 1123 07/01/15 1630 07/01/15 2141 07/02/15 0618  GLUCAP 256* 364* 225* 139* 88

## 2015-07-02 NOTE — Progress Notes (Signed)
Hospital Problem List     Principal Problem:   Bradycardia Active Problems:   Hyperlipidemia   Morbid obesity (HCC)   Seizure (HCC)   Vomiting   CKD (chronic kidney disease), stage IV (HCC)   Essential hypertension   Type II diabetes mellitus (HCC)   DVT (deep venous thrombosis) (HCC)   Chronic diastolic (congestive) heart failure [facility])    Patient Profile:   Primary Cardiologist: Dr. Angelena Form  70 yo female w/ PMH of Type 2 DM, HTN, HLD, Stage 4 CKD, and chronic diastolic CHF who presented to [facility] ED on 06/30/2015 for evaluation of nausea and vomiting. Cardiology consulted for bradycardia, as HR was in the 40's upon arrival to the ED.  Subjective   Denies any repeat episodes of N/V overnight. No chest pain or dyspnea with exertion. Reports 1 episode of diarrhea this AM.   Inpatient Medications    . amLODipine  10 mg Oral Daily  . aspirin  81 mg Oral Daily  . atorvastatin  20 mg Oral q1800  . calcitRIOL  0.25 mcg Oral Daily  . calcium acetate  667 mg Oral TID WC  . carbamazepine  200 mg Oral BID  . citalopram  20 mg Oral Daily  . ezetimibe  10 mg Oral Daily  . feeding supplement (ENSURE ENLIVE)  237 mL Oral BID BM  . furosemide  160 mg Oral BID  . hydrALAZINE  100 mg Oral Q8H  . insulin aspart  0-5 Units Subcutaneous QHS  . insulin aspart  0-9 Units Subcutaneous TID WC  . insulin aspart protamine- aspart  20 Units Subcutaneous Q breakfast  . irbesartan  150 mg Oral Daily  . lacosamide  100 mg Oral BID  . pantoprazole  80 mg Oral Daily  . polyethylene glycol  17 g Oral Daily  . senna  1 tablet Oral Daily  . sodium bicarbonate  1,300 mg Oral BID  . sodium chloride flush  3 mL Intravenous Q12H    Vital Signs    Filed Vitals:   07/01/15 0438 07/01/15 1831 07/01/15 2104 07/02/15 0434  BP: 168/45 148/51 142/52 156/53  Pulse:  75 84 74  Temp:  98.2 F (36.8 C)  98.6 F (37 C)  TempSrc:  Oral  Oral  Resp:   18 18  Height:      Weight:    231 lb 3.2 oz  (104.872 kg)  SpO2:  100% 92% 86%    Intake/Output Summary (Last 24 hours) at 07/02/15 1201 Last data filed at 07/02/15 0900  Gross per 24 hour  Intake    120 ml  Output      1 ml  Net    119 ml   Filed Weights   06/30/15 1251 07/01/15 0223 07/02/15 0434  Weight: 227 lb 3.2 oz (103.057 kg) 223 lb 12.3 oz (101.5 kg) 231 lb 3.2 oz (104.872 kg)    Physical Exam    General: Well developed, well nourished, female appearing in no acute distress. Head: Normocephalic, atraumatic.  Neck: Supple without bruits, JVD not elevated. Lungs:  Resp regular and unlabored, CTA without wheezing or rales. Heart: RRR, S1, S2, no S3, S4, or murmur; no rub. Abdomen: Soft, non-tender, non-distended with normoactive bowel sounds. No hepatomegaly. No rebound/guarding. No obvious abdominal masses. Extremities: No clubbing, cyanosis, or edema. Distal pedal pulses are 2+ bilaterally. Neuro: Alert and oriented X 3. Moves all extremities spontaneously. Psych: Normal affect.  Labs    CBC  Recent Labs  06/30/15 [id number] 07/01/15 0317 07/02/15 0215  WBC 5.9 8.4 6.6  NEUTROABS 4.2  --   --   HGB 9.8* 9.4* 9.0*  HCT 30.0* 28.2* 26.7*  MCV 89.6 90.7 89.9  PLT 209 175 0000000   Basic Metabolic Panel  Recent Labs  06/30/15 0652 07/01/15 0317 07/02/15 0215  NA 139 138 136  K 4.9 4.7 4.9  CL 112* 107 104  CO2 21* 20* 21*  GLUCOSE 185* 200* 98  BUN 41* 40* 52*  CREATININE 2.46* 2.43* 3.12*  CALCIUM 8.7* 9.0 9.0  MG 2.0  --   --   PHOS 3.8  --   --    Liver Function Tests  Recent Labs  06/30/15 0652 07/01/15 0317  AST 19 18  ALT 21 19  ALKPHOS 92 79  BILITOT 0.5 1.2  PROT 6.4* 6.5  ALBUMIN 3.5 3.4*    Recent Labs  06/30/15 0652  LIPASE 36   Cardiac Enzymes No results for input(s): CKTOTAL, CKMB, CKMBINDEX, TROPONINI in the last 72 hours. BNP Invalid input(s): POCBNP D-Dimer No results for input(s): DDIMER in the last 72 hours. Hemoglobin A1C  Recent Labs  06/30/15 0652  HGBA1C  5.4    Telemetry    NSR, HR in 60's - 70's. No atopic events.   ECG    No new tracings.   Cardiac Studies and Radiology    Dg Chest 2 View: 06/30/2015  CLINICAL DATA:  Nausea, vomiting and bradycardia beginning last night. Initial encounter. EXAM: CHEST  2 VIEW COMPARISON:  PA and lateral chest 12/14/2014. FINDINGS: The lungs are clear. Heart size is upper normal. No pneumothorax or pleural effusion. Asymmetric elevation of the right hemidiaphragm relative to the left is unchanged. IMPRESSION: No acute disease. Electronically Signed   By: Inge Rise M.D.   On: 06/30/2015 07:35    Echocardiogram: 11/2014 Study Conclusions - Left ventricle: The cavity size was normal. Wall thickness was  increased in a pattern of moderate LVH. Systolic function was  normal. The estimated ejection fraction was in the range of 55%  to 60%. Wall motion was normal; there were no regional wall  motion abnormalities. - Aortic valve: Valve area (VTI): 1.97 cm^2. Valve area (Vmax):  1.77 cm^2. Valve area (Vmean): 2.09 cm^2. - Left atrium: The atrium was mildly dilated.  Assessment & Plan    1. Hypertensive urgency - BP improved. Has been 142/51 - 156/53 in the past 24 hours.  - Continue Amlodipine 10mg  daily, Hydralazine 100mg  TID, Irbesartan 150mg  daily. Could consider increasing Irbesartan to 300mg  daily as an outpatient if HTN persists.  2. Symptomatic bradycardia - noted to have bradycardia in 11/2014. BB and Clonidine both discontinued at that time but Metoprolol was still listed as a home medication.  - Toprol- XL has been discontinued. - HR now improved into the 60's - 70's on telemetry. No further episodes of bradycardia.   3. Upper extremity arterial occlusion  - s/p L brachial embolectomy with vein patch angioplasty using cephalic vein 0000000 - reported stopping Coumadin in 2016. Continue 81mg  ASA.  4. HLD - on Vytorin  5. CKD stage IV - creatinine at 3.12 on  07/02/2015    Wishes to follow-up with Dr. Angelena Form as needed, for she reports her PCP manages her medical conditions. Our office number was listed on her AVS and for her to call if needed.  Arna Medici , PA-C 12:01 PM 07/02/2015 Pager: [phone]   I have examined the patient and reviewed  assessment and plan and discussed with patient.  Agree with above as stated.  CKD will limit titration of ARB.  COntinue meds at current doses.    Larae Grooms

## 2015-07-02 NOTE — Progress Notes (Signed)
[name] to be D/C'd Home per MD order. Discussed with the patient and all questions fully answered.    VVS, Skin clean, dry and intact without evidence of skin break down, no evidence of skin tears noted.  IV catheter discontinued intact. Site without signs and symptoms of complications. Dressing and pressure applied.  An After Visit Summary was printed and given to the patient.  Patient escorted via [place], and D/C home via private auto.  Cyndra Numbers  07/02/2015 4:51 PM

## 2015-07-04 DIAGNOSIS — I10 Essential (primary) hypertension: Secondary | ICD-10-CM | POA: Diagnosis not present

## 2015-07-04 DIAGNOSIS — E1159 Type 2 diabetes mellitus with other circulatory complications: Secondary | ICD-10-CM | POA: Diagnosis not present

## 2015-07-04 DIAGNOSIS — E785 Hyperlipidemia, unspecified: Secondary | ICD-10-CM | POA: Diagnosis not present

## 2015-07-04 DIAGNOSIS — K219 Gastro-esophageal reflux disease without esophagitis: Secondary | ICD-10-CM | POA: Diagnosis not present

## 2015-07-11 DIAGNOSIS — I5030 Unspecified diastolic (congestive) heart failure: Secondary | ICD-10-CM | POA: Diagnosis not present

## 2015-07-11 DIAGNOSIS — G4733 Obstructive sleep apnea (adult) (pediatric): Secondary | ICD-10-CM | POA: Diagnosis not present

## 2015-07-11 DIAGNOSIS — I4891 Unspecified atrial fibrillation: Secondary | ICD-10-CM | POA: Diagnosis not present

## 2015-07-11 DIAGNOSIS — E119 Type 2 diabetes mellitus without complications: Secondary | ICD-10-CM | POA: Diagnosis not present

## 2015-07-11 DIAGNOSIS — M6281 Muscle weakness (generalized): Secondary | ICD-10-CM | POA: Diagnosis not present

## 2015-08-01 DIAGNOSIS — I10 Essential (primary) hypertension: Secondary | ICD-10-CM | POA: Diagnosis not present

## 2015-08-01 DIAGNOSIS — E559 Vitamin D deficiency, unspecified: Secondary | ICD-10-CM | POA: Diagnosis not present

## 2015-08-10 DIAGNOSIS — G4733 Obstructive sleep apnea (adult) (pediatric): Secondary | ICD-10-CM | POA: Diagnosis not present

## 2015-08-10 DIAGNOSIS — I4891 Unspecified atrial fibrillation: Secondary | ICD-10-CM | POA: Diagnosis not present

## 2015-08-10 DIAGNOSIS — I5030 Unspecified diastolic (congestive) heart failure: Secondary | ICD-10-CM | POA: Diagnosis not present

## 2015-08-10 DIAGNOSIS — M6281 Muscle weakness (generalized): Secondary | ICD-10-CM | POA: Diagnosis not present

## 2015-08-10 DIAGNOSIS — E119 Type 2 diabetes mellitus without complications: Secondary | ICD-10-CM | POA: Diagnosis not present

## 2015-09-01 DIAGNOSIS — I10 Essential (primary) hypertension: Secondary | ICD-10-CM | POA: Diagnosis not present

## 2015-09-01 DIAGNOSIS — E559 Vitamin D deficiency, unspecified: Secondary | ICD-10-CM | POA: Diagnosis not present

## 2015-09-10 DIAGNOSIS — M6281 Muscle weakness (generalized): Secondary | ICD-10-CM | POA: Diagnosis not present

## 2015-09-10 DIAGNOSIS — G4733 Obstructive sleep apnea (adult) (pediatric): Secondary | ICD-10-CM | POA: Diagnosis not present

## 2015-09-10 DIAGNOSIS — I5030 Unspecified diastolic (congestive) heart failure: Secondary | ICD-10-CM | POA: Diagnosis not present

## 2015-09-10 DIAGNOSIS — I4891 Unspecified atrial fibrillation: Secondary | ICD-10-CM | POA: Diagnosis not present

## 2015-09-10 DIAGNOSIS — E119 Type 2 diabetes mellitus without complications: Secondary | ICD-10-CM | POA: Diagnosis not present

## 2015-09-19 ENCOUNTER — Ambulatory Visit (INDEPENDENT_AMBULATORY_CARE_PROVIDER_SITE_OTHER): Payer: Medicare Other | Admitting: Podiatry

## 2015-09-19 ENCOUNTER — Encounter: Payer: Self-pay | Admitting: Podiatry

## 2015-09-19 DIAGNOSIS — B351 Tinea unguium: Secondary | ICD-10-CM

## 2015-09-19 DIAGNOSIS — M79674 Pain in right toe(s): Secondary | ICD-10-CM

## 2015-09-19 DIAGNOSIS — M79675 Pain in left toe(s): Secondary | ICD-10-CM | POA: Diagnosis not present

## 2015-09-19 NOTE — Progress Notes (Signed)
Patient ID: [name], female   DOB: [birth year], 70 y.o.   MRN: [id number] Complaint:  Visit Type: Patient returns to my office for continued preventative foot care services. Complaint: Patient states" my nails have grown long and thick and become painful to walk and wear shoes" Patient has been diagnosed with DM with no complications. He presents for preventative foot care services. No changes to ROS  Podiatric Exam: Vascular: dorsalis pedis and posterior tibial pulses are palpable bilateral. Capillary return is immediate. Temperature gradient is WNL. Skin turgor WNL  Sensorium: Normal Semmes Weinstein monofilament test. Normal tactile sensation bilaterally. Nail Exam: Pt has thick disfigured discolored nails with subungual debris noted bilateral entire nail hallux through fifth toenails Ulcer Exam: There is no evidence of ulcer or pre-ulcerative changes or infection. Orthopedic Exam: Muscle tone and strength are WNL. No limitations in general ROM. No crepitus or effusions noted. Foot type and digits show no abnormalities. Bony prominences are unremarkable. Skin: No Porokeratosis. No infection or ulcers. Xerotic skin plantar B/L  Diagnosis:  Tinea unguium, Pain in right toe, pain in left toes  Treatment & Plan Procedures and Treatment: Consent by patient was obtained for treatment procedures. The patient understood the discussion of treatment and procedures well. All questions were answered thoroughly reviewed. Debridement of mycotic and hypertrophic toenails, 1 through 5 bilateral and clearing of subungual debris. No ulceration, no infection noted.  Return Visit-Office Procedure: Patient instructed to return to the office for a follow up visit 3 months for continued evaluation and treatment.   Gardiner Barefoot DPM

## 2015-10-11 DIAGNOSIS — G4733 Obstructive sleep apnea (adult) (pediatric): Secondary | ICD-10-CM | POA: Diagnosis not present

## 2015-10-11 DIAGNOSIS — I4891 Unspecified atrial fibrillation: Secondary | ICD-10-CM | POA: Diagnosis not present

## 2015-10-11 DIAGNOSIS — E119 Type 2 diabetes mellitus without complications: Secondary | ICD-10-CM | POA: Diagnosis not present

## 2015-10-11 DIAGNOSIS — M6281 Muscle weakness (generalized): Secondary | ICD-10-CM | POA: Diagnosis not present

## 2015-10-11 DIAGNOSIS — I5030 Unspecified diastolic (congestive) heart failure: Secondary | ICD-10-CM | POA: Diagnosis not present

## 2015-10-15 DIAGNOSIS — E8770 Fluid overload, unspecified: Secondary | ICD-10-CM | POA: Diagnosis not present

## 2015-10-15 DIAGNOSIS — N179 Acute kidney failure, unspecified: Secondary | ICD-10-CM | POA: Diagnosis not present

## 2015-10-15 DIAGNOSIS — N189 Chronic kidney disease, unspecified: Secondary | ICD-10-CM | POA: Diagnosis not present

## 2015-10-24 DIAGNOSIS — E559 Vitamin D deficiency, unspecified: Secondary | ICD-10-CM | POA: Diagnosis not present

## 2015-10-24 DIAGNOSIS — I10 Essential (primary) hypertension: Secondary | ICD-10-CM | POA: Diagnosis not present

## 2015-11-10 DIAGNOSIS — M6281 Muscle weakness (generalized): Secondary | ICD-10-CM | POA: Diagnosis not present

## 2015-11-10 DIAGNOSIS — G4733 Obstructive sleep apnea (adult) (pediatric): Secondary | ICD-10-CM | POA: Diagnosis not present

## 2015-11-10 DIAGNOSIS — I5030 Unspecified diastolic (congestive) heart failure: Secondary | ICD-10-CM | POA: Diagnosis not present

## 2015-11-10 DIAGNOSIS — I4891 Unspecified atrial fibrillation: Secondary | ICD-10-CM | POA: Diagnosis not present

## 2015-11-10 DIAGNOSIS — E119 Type 2 diabetes mellitus without complications: Secondary | ICD-10-CM | POA: Diagnosis not present

## 2015-11-21 DIAGNOSIS — I10 Essential (primary) hypertension: Secondary | ICD-10-CM | POA: Diagnosis not present

## 2015-11-21 DIAGNOSIS — E559 Vitamin D deficiency, unspecified: Secondary | ICD-10-CM | POA: Diagnosis not present

## 2015-12-06 ENCOUNTER — Ambulatory Visit (HOSPITAL_COMMUNITY)
Admission: EM | Admit: 2015-12-06 | Discharge: 2015-12-06 | Disposition: A | Discharge status: Home / Self Care | Payer: Medicare Other | Attending: Family Medicine | Admitting: Family Medicine

## 2015-12-06 ENCOUNTER — Encounter (HOSPITAL_COMMUNITY): Payer: Self-pay | Admitting: *Deleted

## 2015-12-06 DIAGNOSIS — I10 Essential (primary) hypertension: Secondary | ICD-10-CM

## 2015-12-06 DIAGNOSIS — Z760 Encounter for issue of repeat prescription: Secondary | ICD-10-CM

## 2015-12-06 MED ORDER — SODIUM BICARBONATE 650 MG PO TABS
1300.0000 mg | ORAL_TABLET | Freq: Once | ORAL | Status: AC
Start: 2015-12-06 — End: 2015-12-06

## 2015-12-06 MED ORDER — HYDRALAZINE HCL 25 MG PO TABS: 25.0000 mg | ORAL_TABLET | Freq: Two times a day (BID) | ORAL | 3 refills | Status: DC

## 2015-12-06 NOTE — ED Triage Notes (Signed)
Requesting refill of hydralazine 25mg  BID.

## 2015-12-06 NOTE — ED Provider Notes (Signed)
[place] [facility]    CSN: [id number] Arrival date & time: 12/06/15  1450     History   Chief Complaint Chief Complaint  Patient presents with  . Medication Refill    HPI [name] is a 70 y.o. female.   This is a 70 year old woman who presents to the urgent care center because she ran out of her hypertension medicine yesterday. She has a scheduled point with her primary care provider in 5 days.  Patient's had hypertension for over 10 years. Her blood pressure has been controlled fairly well on hydralazine 25 mg twice a day. She's had no rash.  Patient's other problems include syncopal episodes thought to be seizures, hyperlipidemia, morbid obesity, diabetes mellitus, and chronic renal insufficiency.      Past Medical History:  Diagnosis Date  . Anemia   . CHF (congestive heart failure) ([place])   . CKD (chronic kidney disease), stage IV ([place])   . Depression   . DVT (deep venous thrombosis) ([place])   . Essential hypertension   . GERD (gastroesophageal reflux disease)   . Hyperlipidemia   . Morbid obesity ([place])   . Seizures ([place])    "epilepsy; they think I had one a couple months ago" (12/11/2014)  . Type II diabetes mellitus [facility])     Patient Active Problem List   Diagnosis Date Noted  . DVT (deep venous thrombosis) ([place]) 06/30/2015  . Chronic diastolic (congestive) heart failure 06/30/2015  . Bradycardia with 31 - 40 beats per minute   . Emesis   . Junctional bradycardia   . Arterial embolism and thrombosis of upper extremity ([place])   . Bradycardia 12/11/2014  . Acute renal failure superimposed on stage 3 chronic kidney disease ([place]) 12/11/2014  . Vomiting 12/11/2014  . Acute renal failure ([place]) 12/11/2014  . CKD (chronic kidney disease), stage IV ([place])   . Essential hypertension   . Type II diabetes mellitus ([place])   . OSA (obstructive sleep apnea) 10/17/2012  . Elevated troponin 09/26/2012  . Acute encephalopathy 09/25/2012  . CKD (chronic kidney  disease) stage 3, GFR 30-59 ml/min 09/25/2012  . Tremor 09/25/2012  . Rhabdomyolysis 09/25/2012  . Morbid obesity ([place]) 09/16/2006  . DEPRESSION, CHRONIC 09/16/2006  . Seizure ([place]) 09/16/2006  . Diabetes mellitus ([place]) 09/09/2006  . Hyperlipidemia 09/09/2006  . HYPERTENSION, BENIGN 09/09/2006  . CONSTIPATION, HX OF 09/09/2006    Past Surgical History:  Procedure Laterality Date  . THROMBECTOMY BRACHIAL ARTERY Left 12/15/2014   Procedure: THROMBECTOMY BRACHIAL ARTERY; VEIN PATCH ANGIOPLASTY LEFT BRACIAL ARTERY;  Surgeon: Angelia Mould, MD;  Location: [facility];  Service: Vascular;  Laterality: Left;  . TUBAL LIGATION      OB History    No data available       Home Medications    Prior to Admission medications   Medication Sig Start Date End Date Taking? Authorizing Provider  Ascorbic Acid (VITAMIN C PO) Take 1 tablet by mouth daily.   Yes Historical Provider, MD  aspirin 81 MG chewable tablet Chew 1 tablet (81 mg total) by mouth daily. 12/21/14  Yes Ripudeep Krystal Eaton, MD  carbamazepine (TEGRETOL) 200 MG tablet Take 400 mg by mouth 2 (two) times daily.    Yes Historical Provider, MD  Cholecalciferol (VITAMIN D-3) 1000 UNITS CAPS Take 1,000 Units by mouth daily at 12 noon.    Yes Historical Provider, MD  citalopram (CELEXA) 20 MG tablet Take 20 mg by mouth daily.   Yes Historical Provider, MD  docusate sodium (COLACE) 100 MG capsule Take 2 capsules (200 mg total) by mouth 2 (two) times daily. 12/21/14  Yes Ripudeep Krystal Eaton, MD  ezetimibe-simvastatin (VYTORIN) 10-40 MG per tablet Take 1 tablet by mouth daily.   Yes Historical Provider, MD  furosemide (LASIX) 80 MG tablet Take 2 tablets (160 mg total) by mouth 2 (two) times daily. Patient taking differently: Take 40 mg by mouth daily.  12/21/14  Yes Ripudeep K Rai, MD  glipiZIDE (GLUCOTROL XL) 5 MG 24 hr tablet Take 5 mg by mouth daily with breakfast.   Yes Historical Provider, MD  HUMULIN 70/30 KWIKPEN (70-30) 100 UNIT/ML PEN  Inject 20 Units into the skin daily.  10/18/14  Yes Historical Provider, MD  omeprazole (PRILOSEC) 40 MG capsule Take 40 mg by mouth daily.   Yes Historical Provider, MD  bisacodyl (DULCOLAX) 5 MG EC tablet Take 2 tablets (10 mg total) by mouth every morning. 12/21/14   Ripudeep Krystal Eaton, MD  clobetasol cream (TEMOVATE) 4.40 % Apply 1 application topically 2 (two) times daily as needed (for legs).     Historical Provider, MD  hydrALAZINE (APRESOLINE) 25 MG tablet Take 1 tablet (25 mg total) by mouth 2 (two) times daily. 12/06/15   Robyn Haber, MD  sodium bicarbonate 650 MG tablet Take 2 tablets (1,300 mg total) by mouth once. 12/06/15 12/06/15  Robyn Haber, MD    Family History Family History  Problem Relation Age of Onset  . Hypertension Mother     died @ 26 - unknown cause.  [name] Hypertension Father     says she doesn't know anything about her father.  . Cancer Maternal Aunt   . Seizures Neg Hx     Social History Social History  Substance Use Topics  . Smoking status: Never Smoker  . Smokeless tobacco: Never Used     Comment: EXPOSED TO 2ND HAND SMOKE X 20+ YEARS.   [name] Alcohol use No     Allergies   Codeine   Review of Systems Review of Systems  Constitutional: Negative.   HENT: Negative.   Eyes: Negative.   Respiratory: Negative.   Cardiovascular: Negative.   Gastrointestinal: Negative.   Endocrine: Negative.   Genitourinary: Negative.   Musculoskeletal: Negative.   Neurological: Positive for syncope.  Hematological: Negative.   Psychiatric/Behavioral: Negative.      Physical Exam Triage Vital Signs ED Triage Vitals  Enc Vitals Group     BP 12/06/15 1539 192/69     Pulse Rate 12/06/15 1539 79     Resp 12/06/15 1539 18     Temp 12/06/15 1539 98.2 F (36.8 C)     Temp Source 12/06/15 1539 Oral     SpO2 12/06/15 1539 100 %     Weight --      Height --      Head Circumference --      Peak Flow --      Pain Score 12/06/15 1545 0     Pain Loc --       Pain Edu? --      Excl. in [place]? --    No data found.   Updated Vital Signs BP 192/69 (BP Location: Right Arm)   Pulse 79   Temp 98.2 F (36.8 C) (Oral)   Resp 18   SpO2 100%    Physical Exam  Constitutional: She is oriented to person, place, and time. She appears well-developed and well-nourished.  HENT:  Head: Normocephalic.  Right Ear: External ear  normal.  Left Ear: External ear normal.  Mouth/Throat: Oropharynx is clear and moist.  Eyes: Conjunctivae and EOM are normal. Pupils are equal, round, and reactive to light.  Neck: Normal range of motion. Neck supple.  Cardiovascular: Normal rate, regular rhythm and normal heart sounds.   Pulmonary/Chest: Breath sounds normal.  Musculoskeletal: Normal range of motion.  Neurological: She is alert and oriented to person, place, and time.  Skin: Skin is warm and dry.  Nursing note and vitals reviewed.    UC Treatments / Results  Labs (all labs ordered are listed, but only abnormal results are displayed) Labs Reviewed - No data to display  EKG  EKG Interpretation None       Radiology No results found.  Procedures Procedures (including critical care time)  Medications Ordered in UC Medications - No data to display   Initial Impression / Assessment and Plan / UC Course  I have reviewed the triage vital signs and the nursing notes.  Pertinent labs & imaging results that were available during my care of the patient were reviewed by me and considered in my medical decision making (see chart for details).  Clinical Course     Final Clinical Impressions(s) / UC Diagnoses   Final diagnoses:  Essential hypertension  Medication refill    New Prescriptions New Prescriptions   HYDRALAZINE (APRESOLINE) 25 MG TABLET    Take 1 tablet (25 mg total) by mouth 2 (two) times daily.     Robyn Haber, MD 12/06/15 [phone]

## 2015-12-06 NOTE — ED Triage Notes (Signed)
Reports running out of HTN meds yesterday; has appt with PCP in 5 days.  Denies any physical complaints.

## 2015-12-11 DIAGNOSIS — M6281 Muscle weakness (generalized): Secondary | ICD-10-CM | POA: Diagnosis not present

## 2015-12-11 DIAGNOSIS — I4891 Unspecified atrial fibrillation: Secondary | ICD-10-CM | POA: Diagnosis not present

## 2015-12-11 DIAGNOSIS — H538 Other visual disturbances: Secondary | ICD-10-CM | POA: Diagnosis not present

## 2015-12-11 DIAGNOSIS — E1159 Type 2 diabetes mellitus with other circulatory complications: Secondary | ICD-10-CM | POA: Diagnosis not present

## 2015-12-11 DIAGNOSIS — Z01118 Encounter for examination of ears and hearing with other abnormal findings: Secondary | ICD-10-CM | POA: Diagnosis not present

## 2015-12-11 DIAGNOSIS — I5030 Unspecified diastolic (congestive) heart failure: Secondary | ICD-10-CM | POA: Diagnosis not present

## 2015-12-11 DIAGNOSIS — G4733 Obstructive sleep apnea (adult) (pediatric): Secondary | ICD-10-CM | POA: Diagnosis not present

## 2015-12-11 DIAGNOSIS — Z0000 Encounter for general adult medical examination without abnormal findings: Secondary | ICD-10-CM | POA: Diagnosis not present

## 2015-12-11 DIAGNOSIS — Z136 Encounter for screening for cardiovascular disorders: Secondary | ICD-10-CM | POA: Diagnosis not present

## 2015-12-11 DIAGNOSIS — E119 Type 2 diabetes mellitus without complications: Secondary | ICD-10-CM | POA: Diagnosis not present

## 2015-12-19 DIAGNOSIS — E559 Vitamin D deficiency, unspecified: Secondary | ICD-10-CM | POA: Diagnosis not present

## 2015-12-19 DIAGNOSIS — I10 Essential (primary) hypertension: Secondary | ICD-10-CM | POA: Diagnosis not present

## 2015-12-26 ENCOUNTER — Ambulatory Visit: Payer: Medicare Other | Admitting: Podiatry

## 2016-01-22 DIAGNOSIS — E559 Vitamin D deficiency, unspecified: Secondary | ICD-10-CM | POA: Diagnosis not present

## 2016-01-22 DIAGNOSIS — I10 Essential (primary) hypertension: Secondary | ICD-10-CM | POA: Diagnosis not present

## 2016-03-28 IMAGING — DX DG CHEST 2V
2 series · 2 of 2 positions shown · non-contrast
Comparison: None.

CLINICAL DATA: Bradycardia and vomiting since yesterday.

EXAM:
CHEST  2 VIEW

[w chest lat]
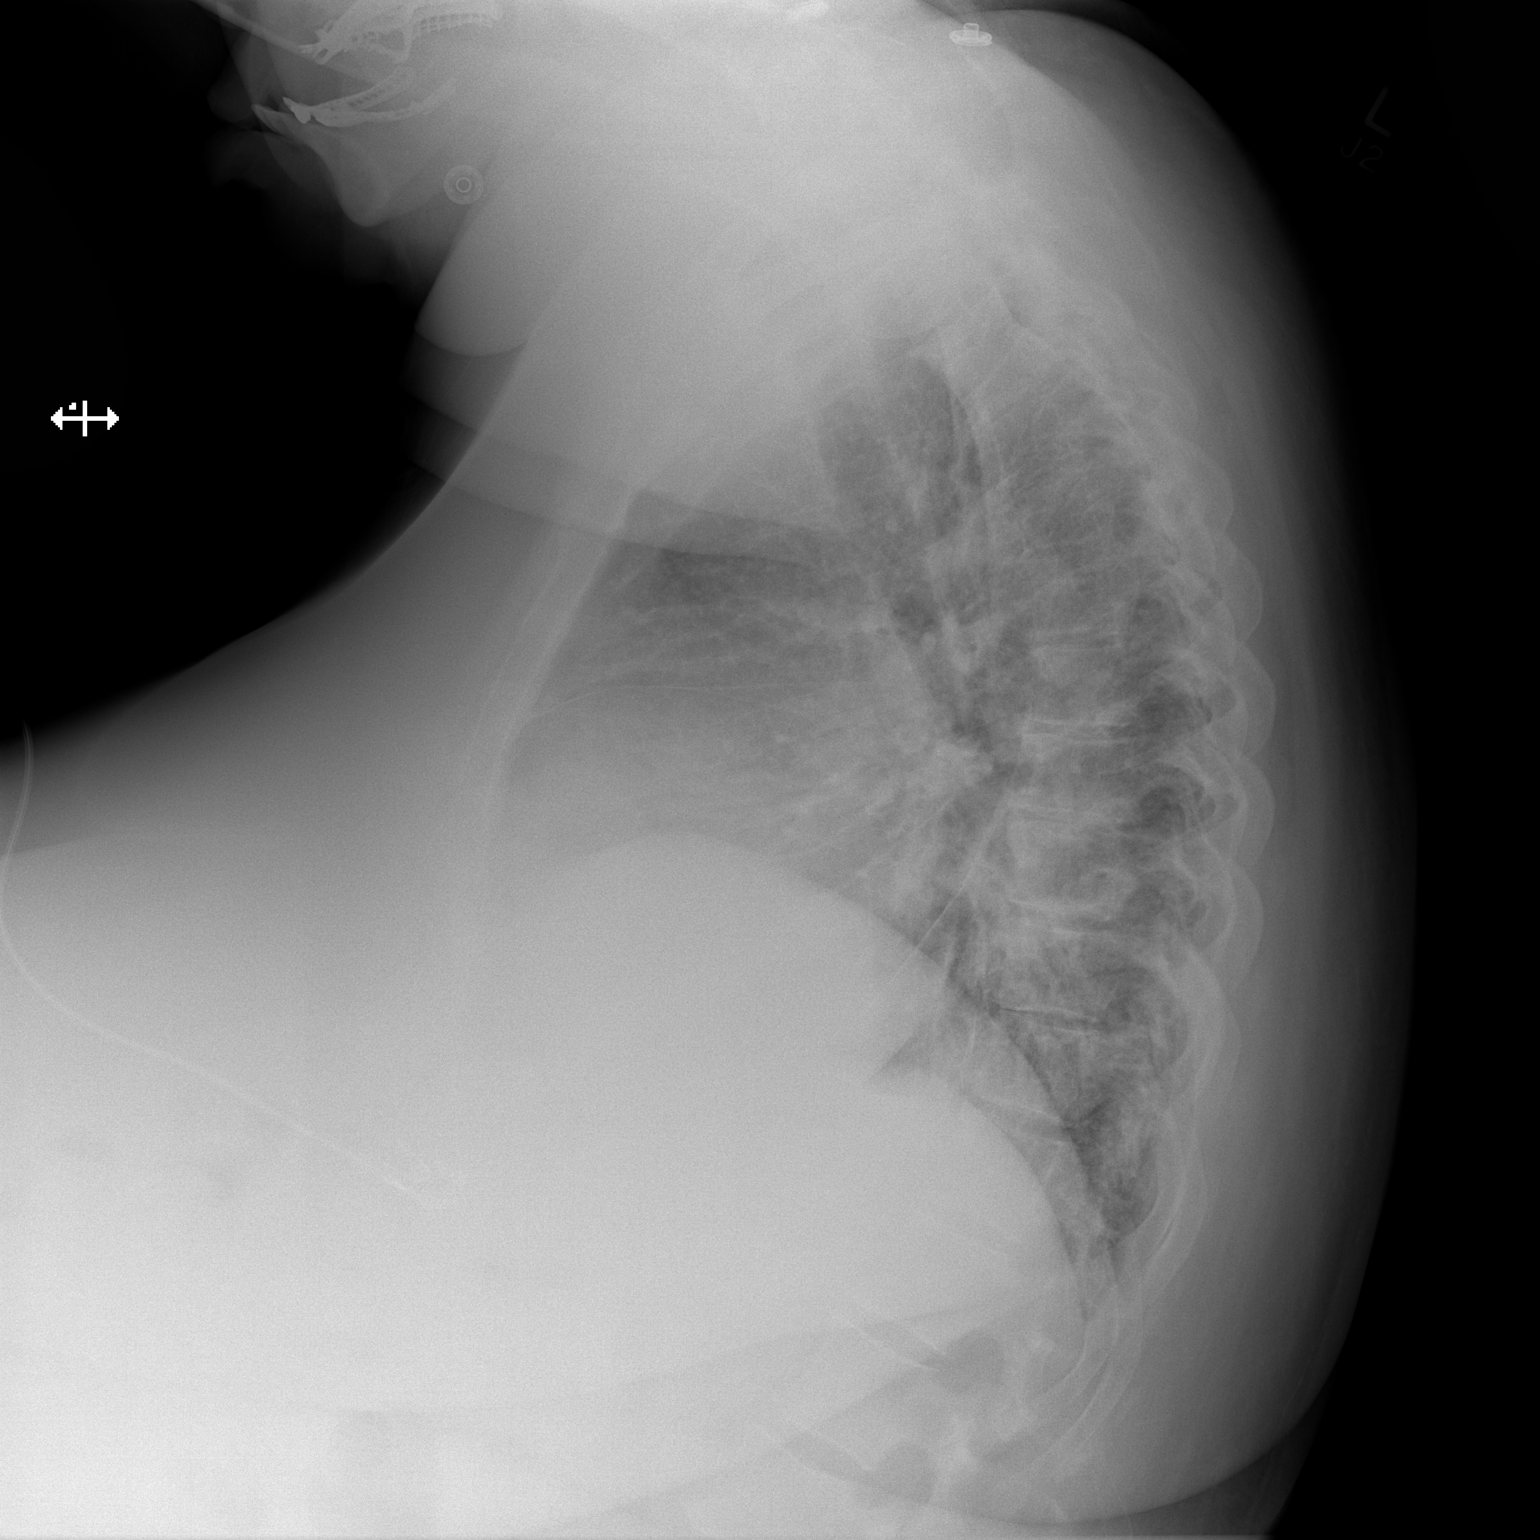

[x chest ap]
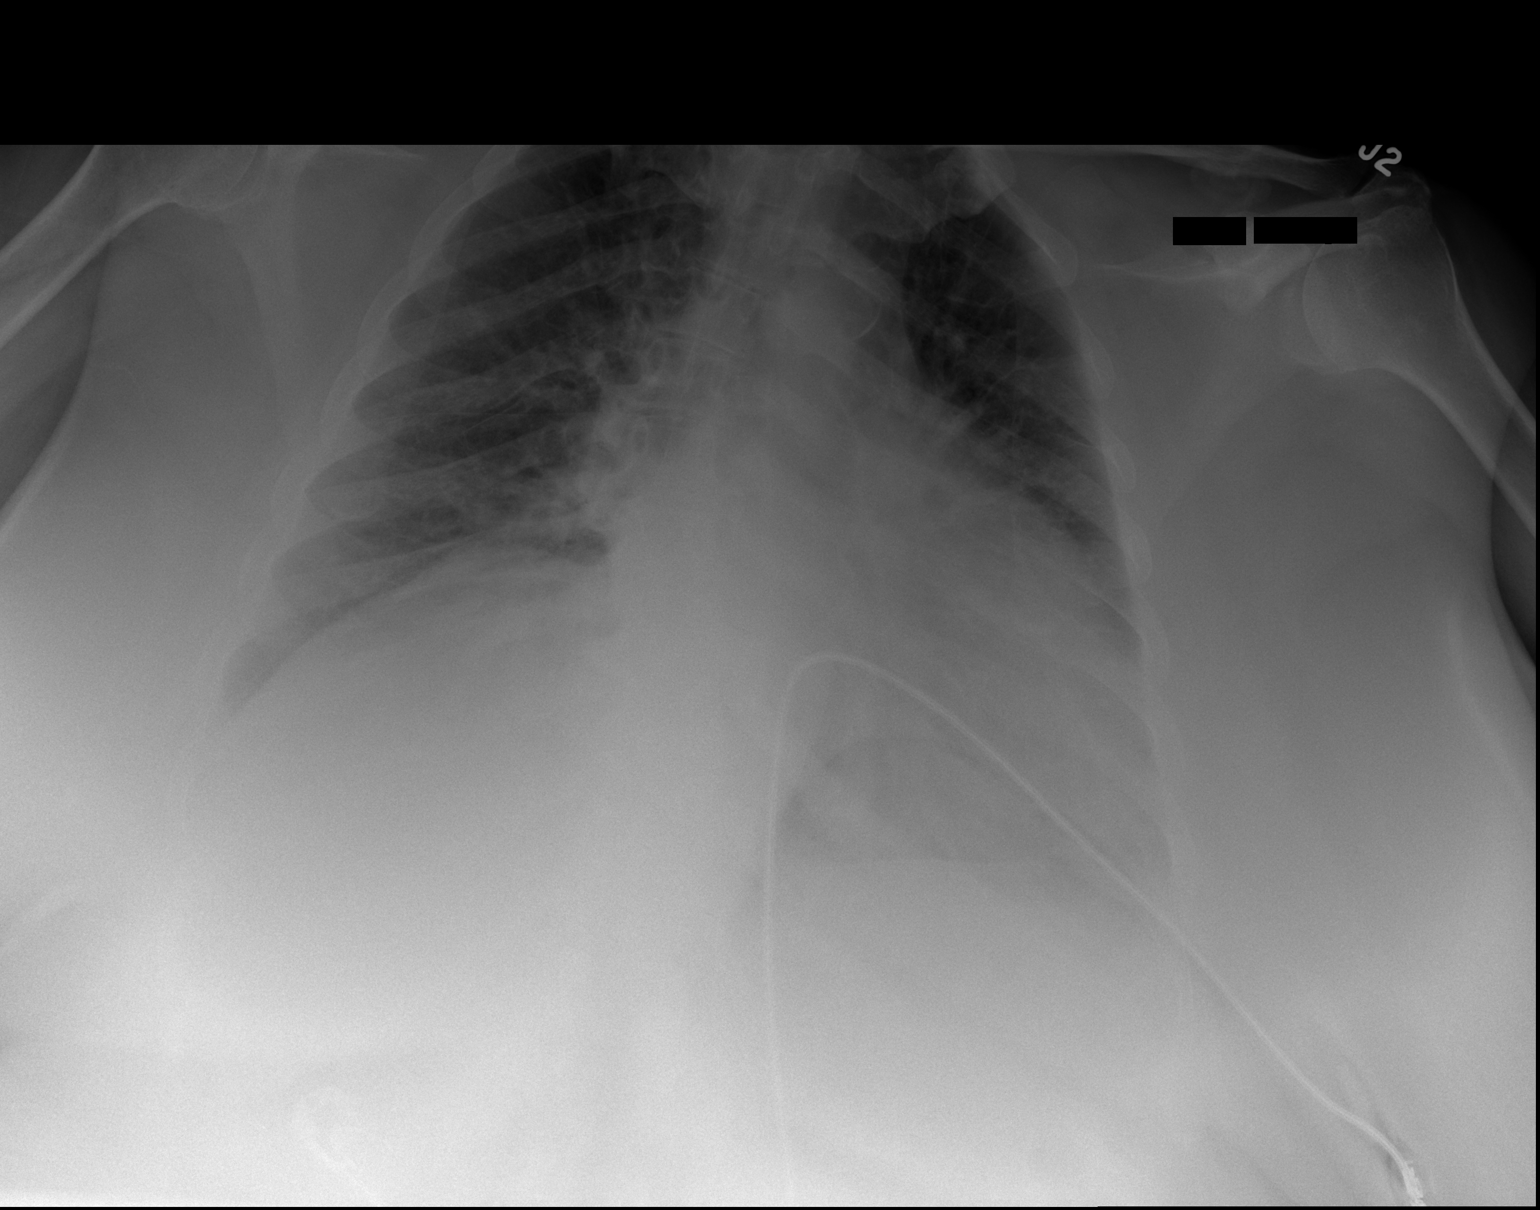

[2 of 2 positions shown; findings below may reference images not displayed]

FINDINGS: Enlarged cardiac silhouette similar to prior. Exam is lordotic which
exaggerates the cardiac silhouette. Interval increase in central
venous congestion. Low lung volumes. No pleural fluid.
IMPRESSION: Cardiomegaly and increased central venous congestion.

## 2016-03-29 IMAGING — DX DG CHEST 1V PORT
1 series · 1 of 1 positions shown · non-contrast
Comparison: 12/11/2014

CLINICAL DATA: Shortness of breath for 2 days

EXAM:
PORTABLE CHEST 1 VIEW

[chest ap]
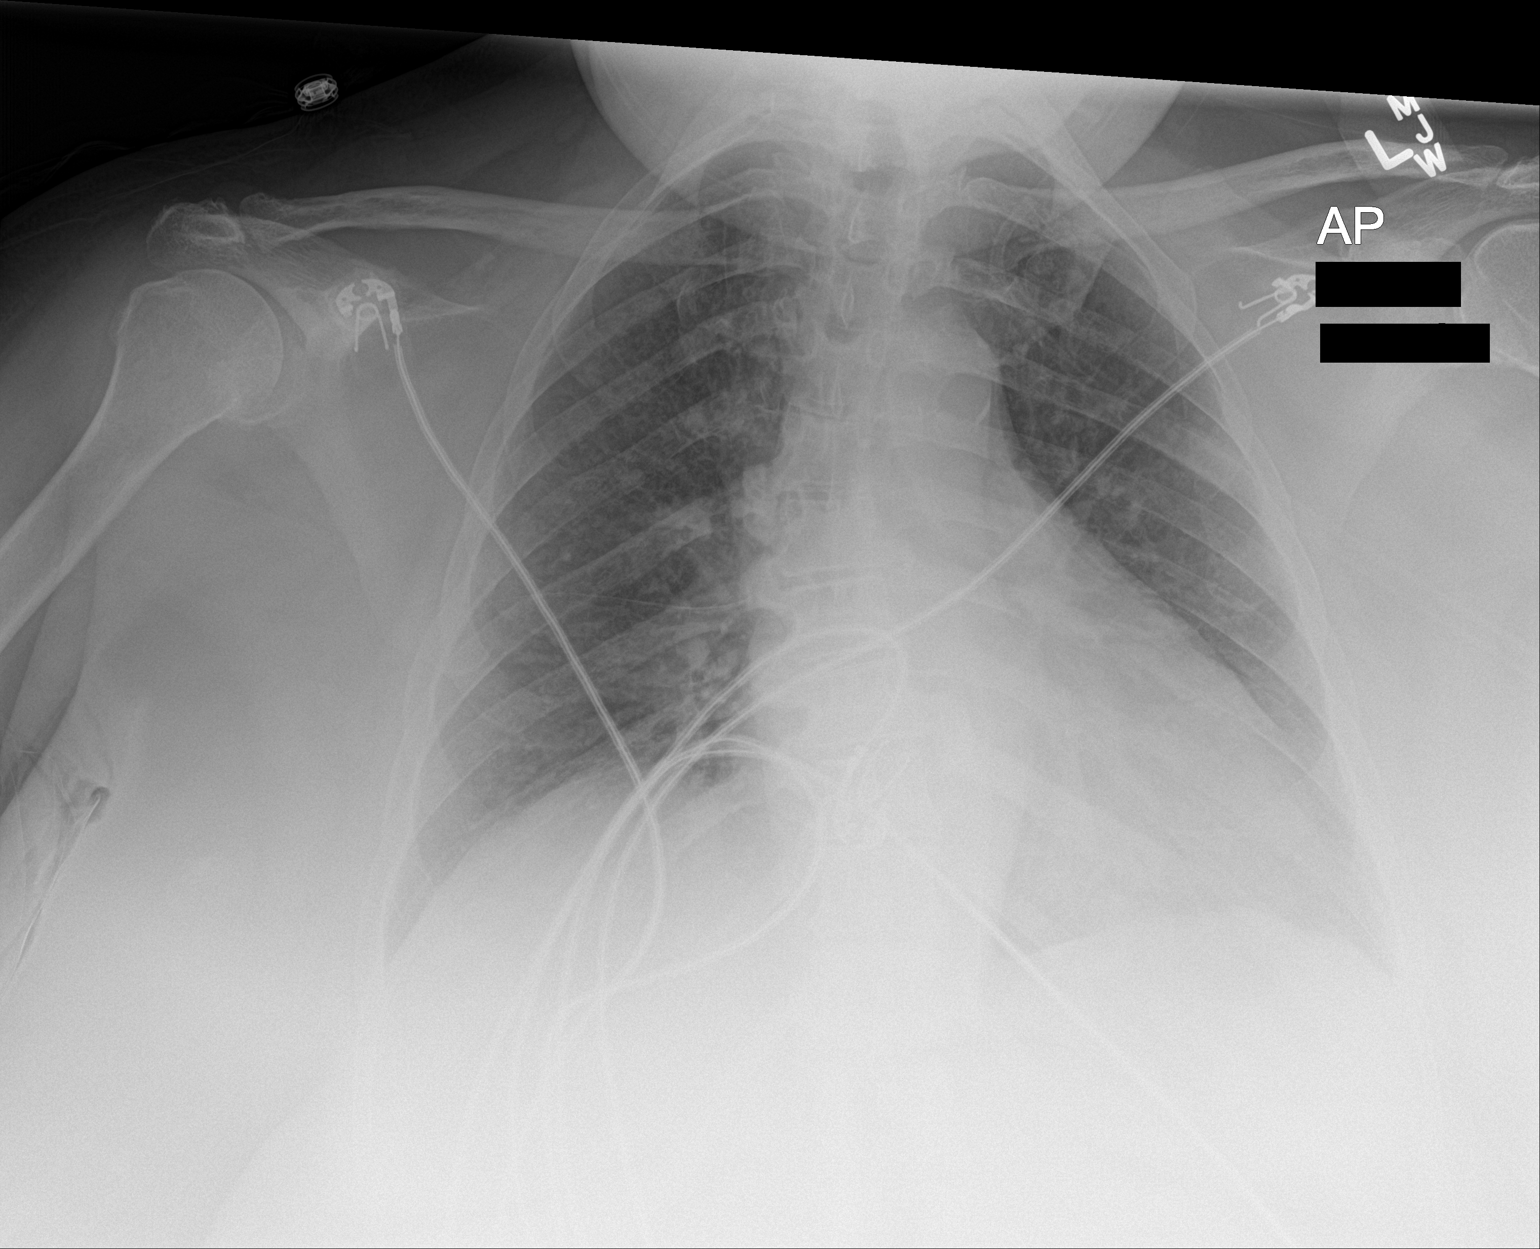

[1 of 1 positions shown; findings below may reference images not displayed]

FINDINGS: Cardiomegaly again noted. Mild thoracic dextroscoliosis.
Degenerative changes thoracic spine. Central mild vascular
congestion without pulmonary edema. No focal infiltrate
IMPRESSION: Cardiomegaly. Mild thoracic dextroscoliosis. Central vascular
congestion without convincing pulmonary edema. No segmental
infiltrate.

## 2016-03-29 IMAGING — DX DG ABD PORTABLE 1V
1 series · 1 of 1 positions shown · non-contrast
Comparison: CT abdomen 08/06/2009

CLINICAL DATA: Nausea

EXAM:
PORTABLE ABDOMEN - 1 VIEW

[abdomen supine]
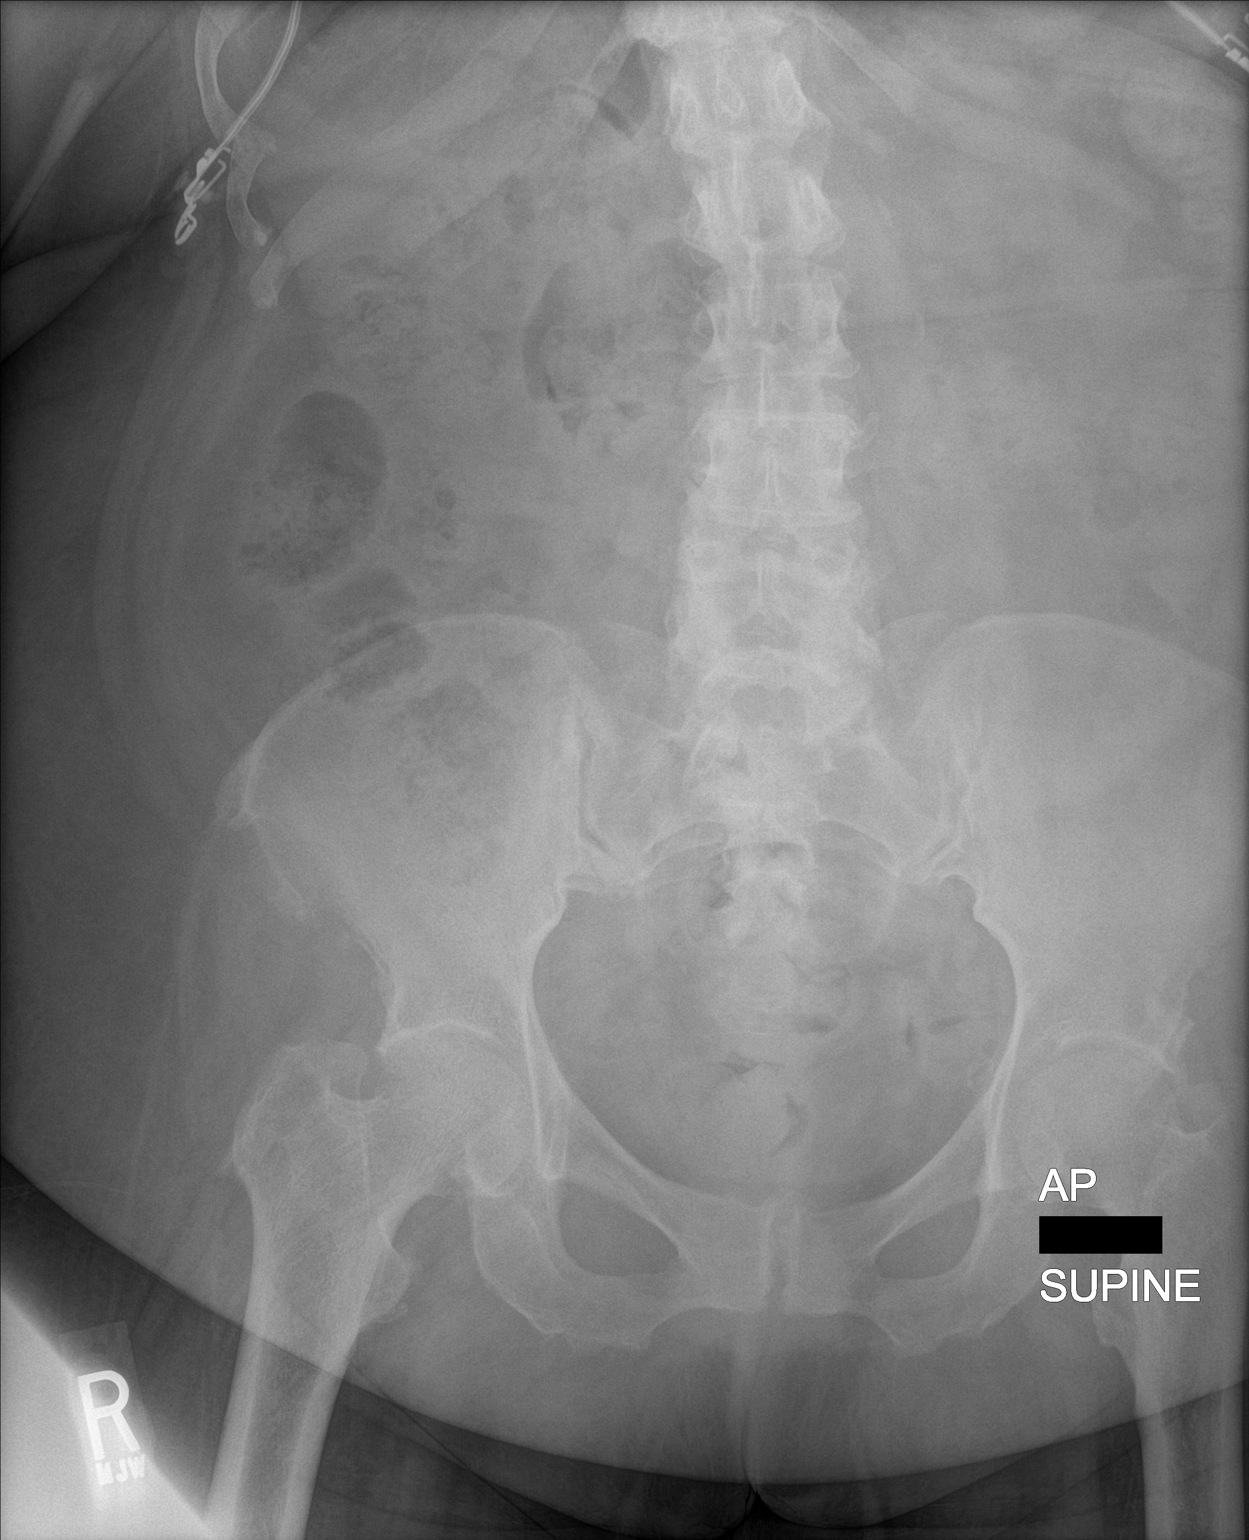

[1 of 1 positions shown; findings below may reference images not displayed]

FINDINGS: Retained stool in the colon. Negative for bowel obstruction. No
renal calculi. Lumbar degenerative change at L4-5 and L5-S1. No
acute skeletal abnormality
IMPRESSION: Constipation without bowel obstruction.

## 2016-03-30 IMAGING — CR DG CHEST 1V PORT
1 series · 1 of 1 positions shown · non-contrast
Comparison: December 12, 2014

CLINICAL DATA: Shortness of breath.

EXAM:
PORTABLE CHEST 1 VIEW

[AP]
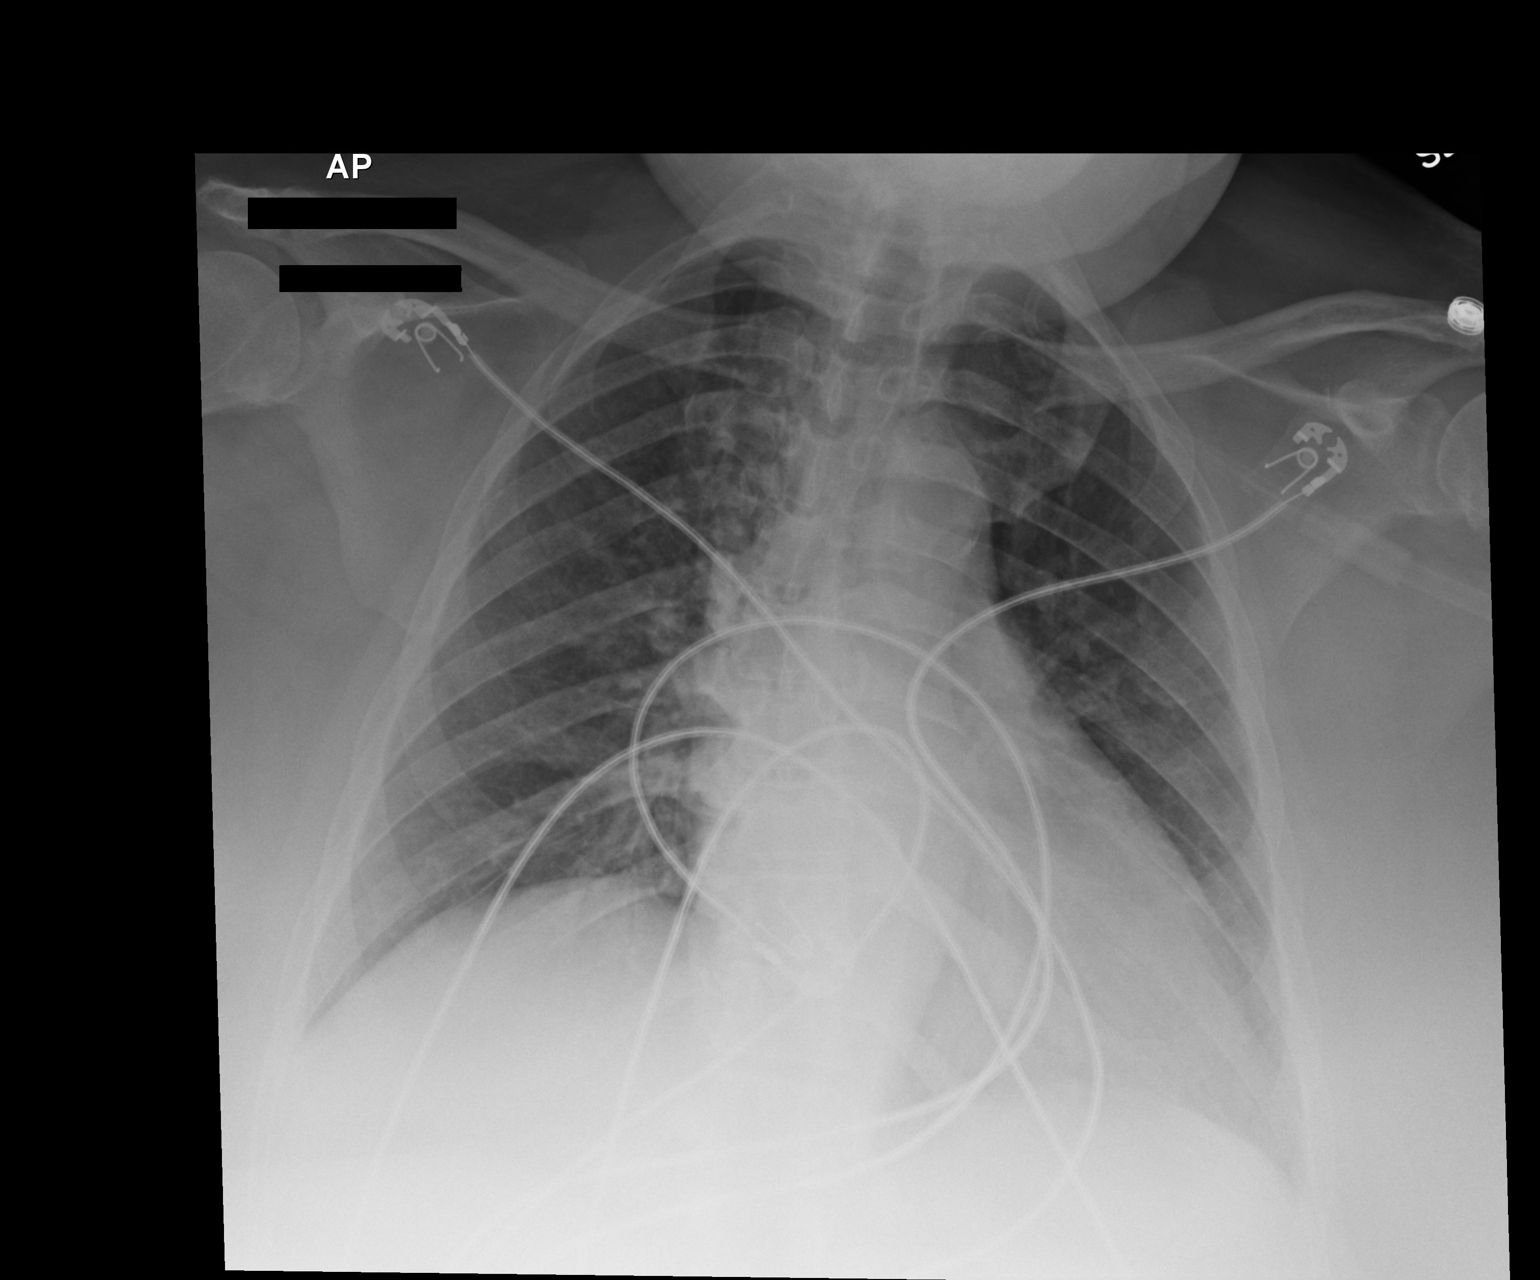

[1 of 1 positions shown; findings below may reference images not displayed]

FINDINGS: Stable mild cardiomegaly is noted with mild central pulmonary
vascular congestion. No pneumothorax or pleural effusion is noted.
Both lungs are clear. Osteophyte formation is noted in mid thoracic
spine.
IMPRESSION: Stable mild cardiomegaly with central pulmonary vascular congestion.
No significant change compared to prior exam.

## 2016-04-19 ENCOUNTER — Other Ambulatory Visit: Payer: Self-pay | Admitting: Internal Medicine

## 2016-04-19 ENCOUNTER — Telehealth: Payer: Self-pay | Admitting: *Deleted

## 2016-04-19 DIAGNOSIS — Z1231 Encounter for screening mammogram for malignant neoplasm of breast: Secondary | ICD-10-CM

## 2016-04-19 NOTE — Telephone Encounter (Signed)
Pt states she is calling for an appt.

## 2016-04-26 DIAGNOSIS — K219 Gastro-esophageal reflux disease without esophagitis: Secondary | ICD-10-CM | POA: Diagnosis not present

## 2016-04-26 DIAGNOSIS — M79644 Pain in right finger(s): Secondary | ICD-10-CM | POA: Diagnosis not present

## 2016-04-26 DIAGNOSIS — E1159 Type 2 diabetes mellitus with other circulatory complications: Secondary | ICD-10-CM | POA: Diagnosis not present

## 2016-04-26 DIAGNOSIS — I10 Essential (primary) hypertension: Secondary | ICD-10-CM | POA: Diagnosis not present

## 2016-04-26 DIAGNOSIS — E785 Hyperlipidemia, unspecified: Secondary | ICD-10-CM | POA: Diagnosis not present

## 2016-05-05 ENCOUNTER — Encounter: Payer: Self-pay | Admitting: Podiatry

## 2016-05-05 ENCOUNTER — Ambulatory Visit (INDEPENDENT_AMBULATORY_CARE_PROVIDER_SITE_OTHER): Payer: Self-pay | Admitting: Podiatry

## 2016-05-05 DIAGNOSIS — M79674 Pain in right toe(s): Secondary | ICD-10-CM

## 2016-05-05 DIAGNOSIS — M79675 Pain in left toe(s): Secondary | ICD-10-CM

## 2016-05-05 DIAGNOSIS — E119 Type 2 diabetes mellitus without complications: Secondary | ICD-10-CM

## 2016-05-05 DIAGNOSIS — B351 Tinea unguium: Secondary | ICD-10-CM

## 2016-05-05 NOTE — Progress Notes (Signed)
Patient ID: [name], female   DOB: 24-Aug-1945, 71 y.o.   MRN: [id number] Complaint:  Visit Type: Patient returns to my office for continued preventative foot care services. Complaint: Patient states" my nails have grown long and thick and become painful to walk and wear shoes" Patient has been diagnosed with DM with no complications. He presents for preventative foot care services. No changes to ROS  Podiatric Exam: Vascular: dorsalis pedis and posterior tibial pulses are palpable bilateral. Capillary return is immediate. Temperature gradient is WNL. Skin turgor WNL  Sensorium: Normal Semmes Weinstein monofilament test. Normal tactile sensation bilaterally. Nail Exam: Pt has thick disfigured discolored nails with subungual debris noted bilateral entire nail hallux through fifth toenails Ulcer Exam: There is no evidence of ulcer or pre-ulcerative changes or infection. Orthopedic Exam: Muscle tone and strength are WNL. No limitations in general ROM. No crepitus or effusions noted. Foot type and digits show no abnormalities. Bony prominences are unremarkable. Skin: No Porokeratosis. No infection or ulcers. Xerotic skin plantar B/L  Diagnosis:  Tinea unguium, Pain in right toe, pain in left toes  Treatment & Plan Procedures and Treatment: Consent by patient was obtained for treatment procedures. The patient understood the discussion of treatment and procedures well. All questions were answered thoroughly reviewed. Debridement of mycotic and hypertrophic toenails, 1 through 5 bilateral and clearing of subungual debris. No ulceration, no infection noted.  Return Visit-Office Procedure: Patient instructed to return to the office for a follow up visit 3 months for continued evaluation and treatment.   Gardiner Barefoot DPM

## 2016-05-14 ENCOUNTER — Inpatient Hospital Stay: Admission: RE | Admit: 2016-05-14 | Payer: Medicare Other | Source: Ambulatory Visit

## 2016-05-24 DIAGNOSIS — M65311 Trigger thumb, right thumb: Secondary | ICD-10-CM | POA: Diagnosis not present

## 2016-07-26 IMAGING — US US RENAL
1 series · 14 of 25 positions shown · non-contrast
Comparison: None.

CLINICAL DATA: Acute renal failure

EXAM:
RENAL / URINARY TRACT ULTRASOUND COMPLETE

[Series 1: us renal · 0.21mm/px · 14 of 28 slices shown]
[im 1/28]
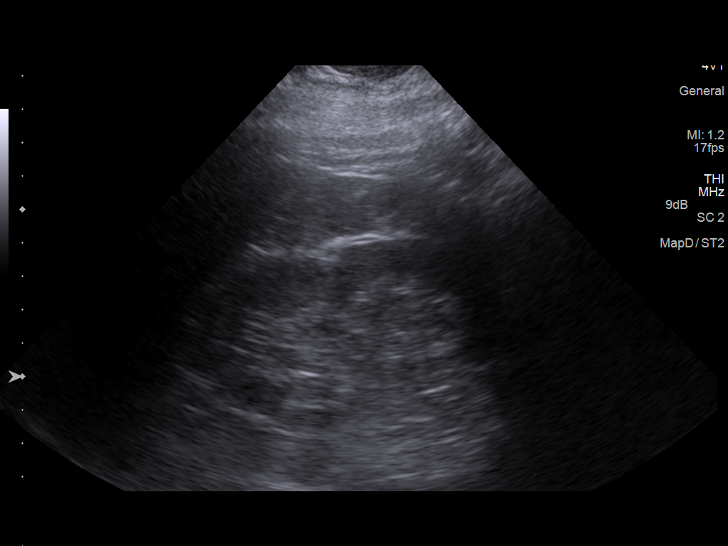
[im 3/28]
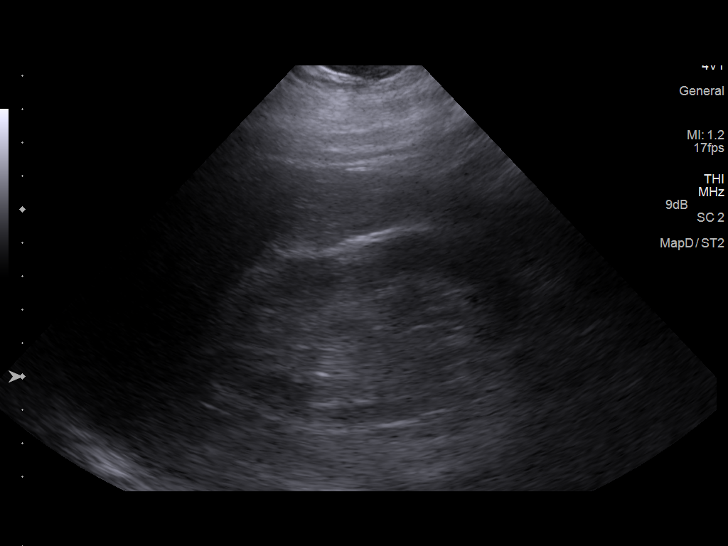
[im 5/28]
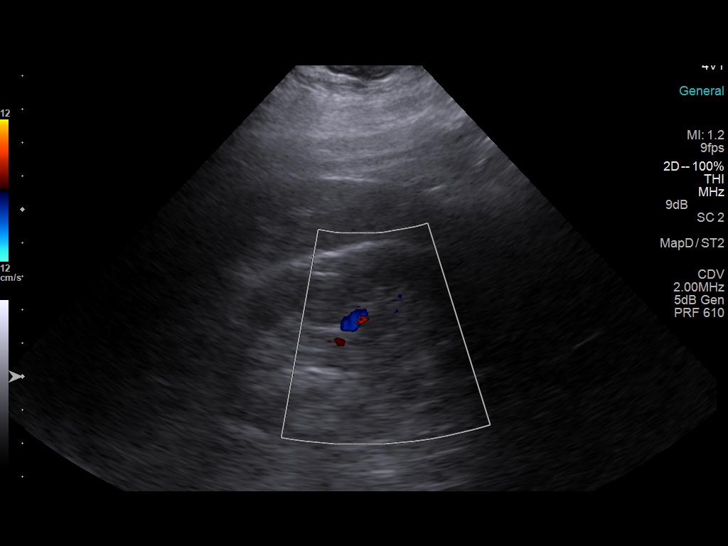
[im 7/28]
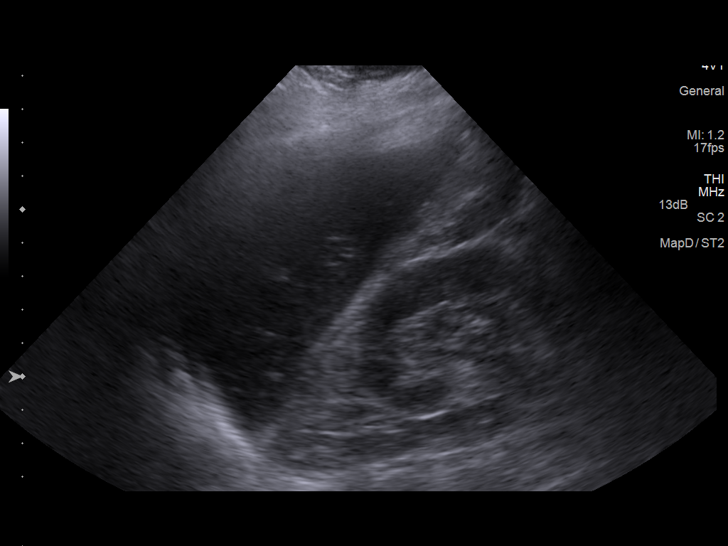
[im 10/28]
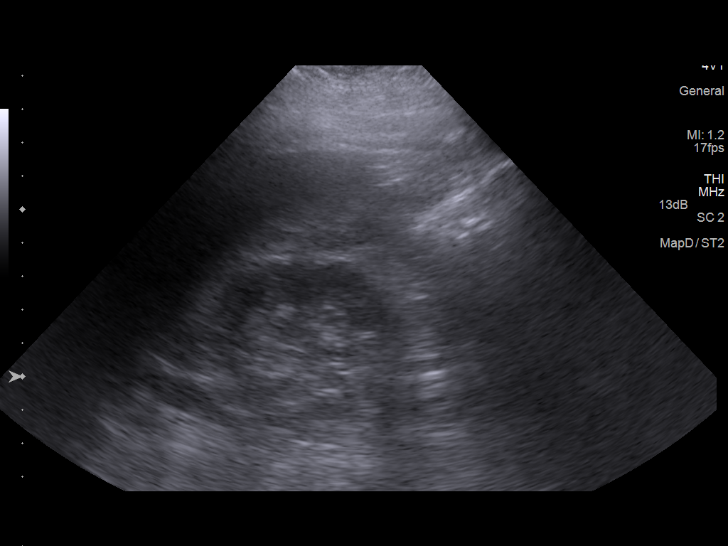
[im 11/28]
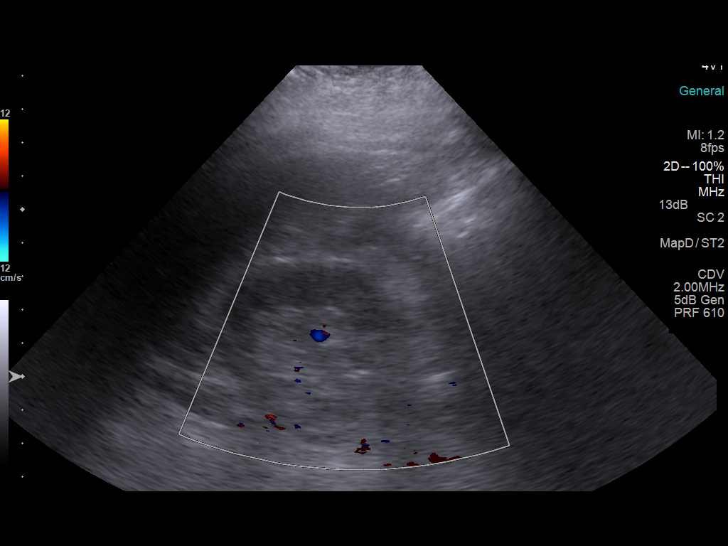
[im 13/28]
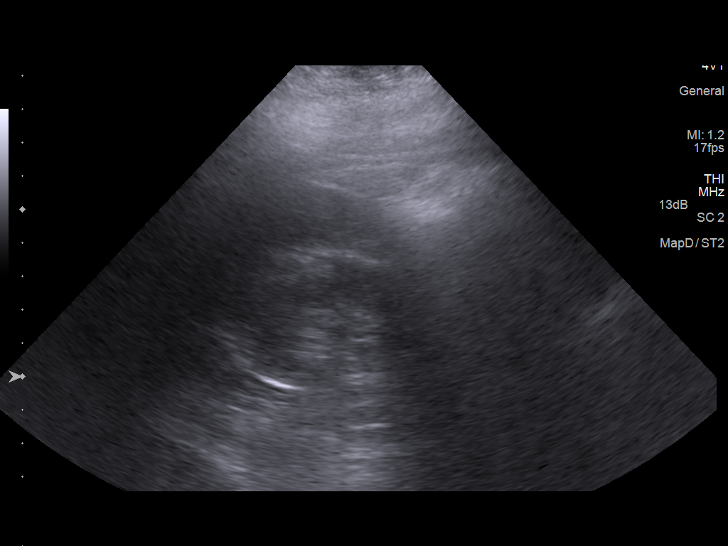
[im 15/28]
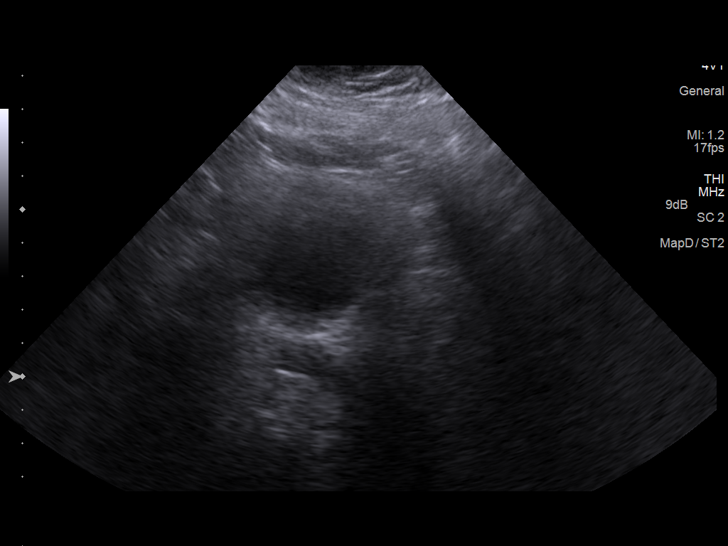
[im 17/28]
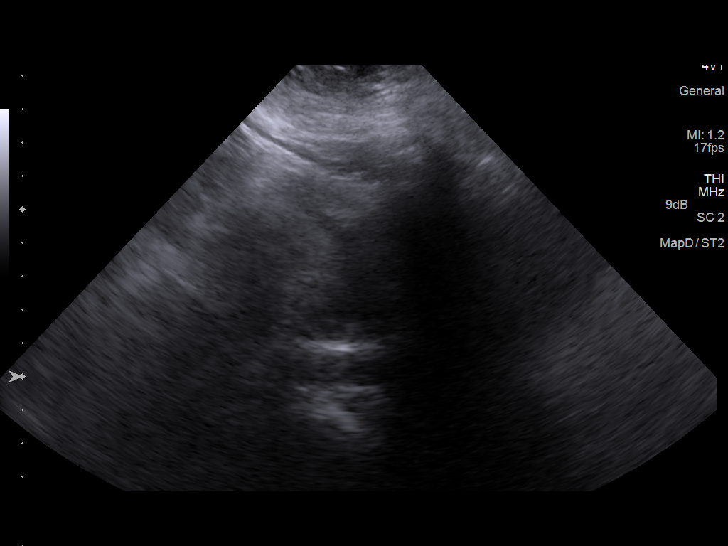
[im 19/28]
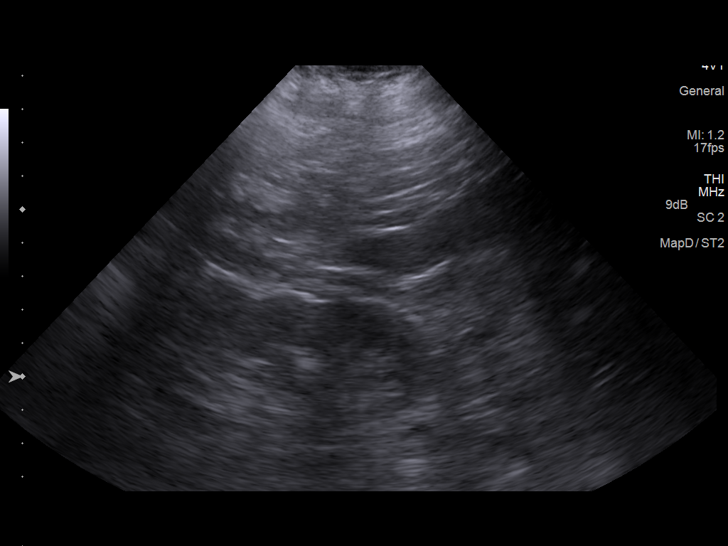
[im 21/28]
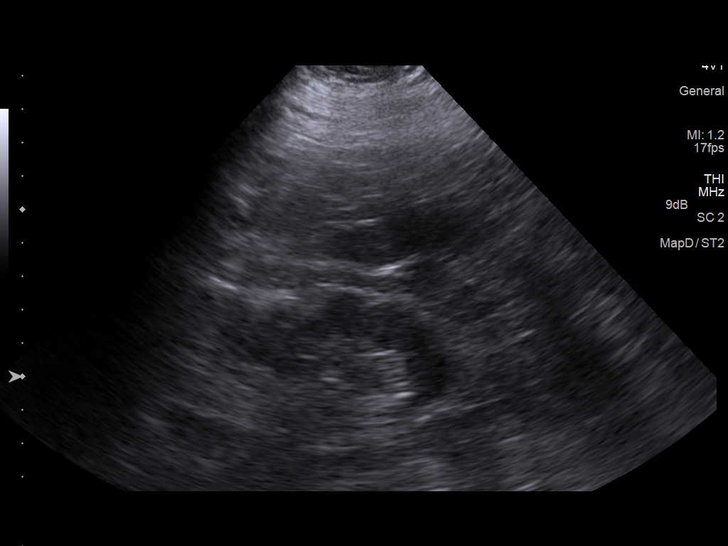
[im 23/28]
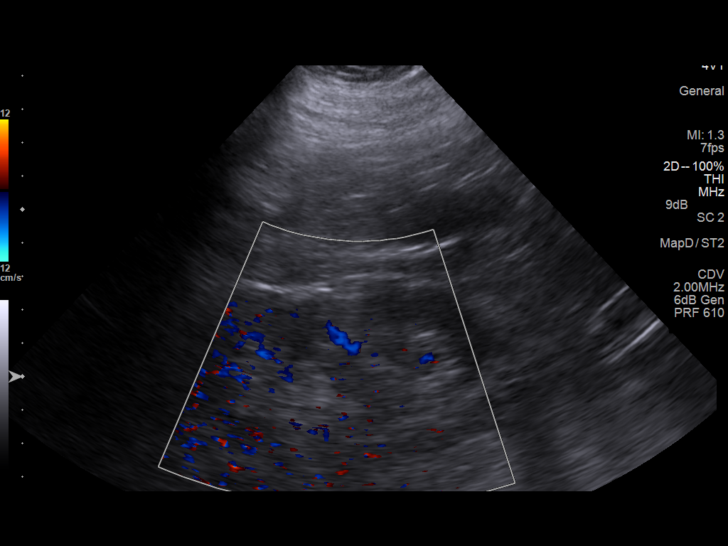
[im 25/28]
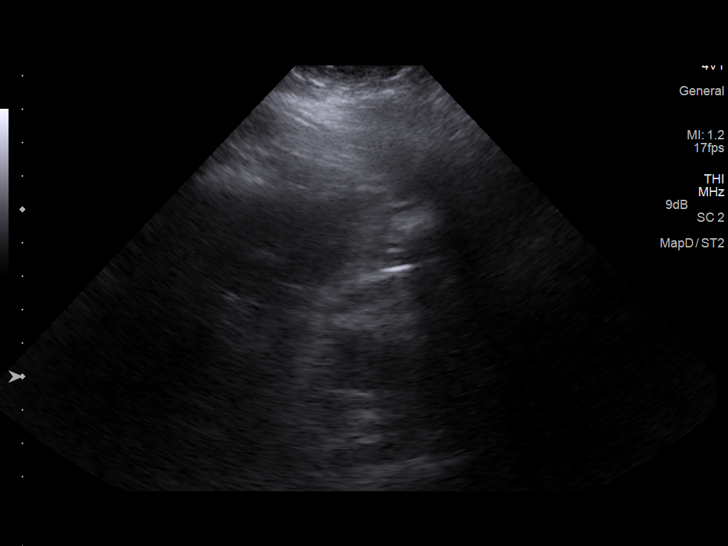
[im 28/28]
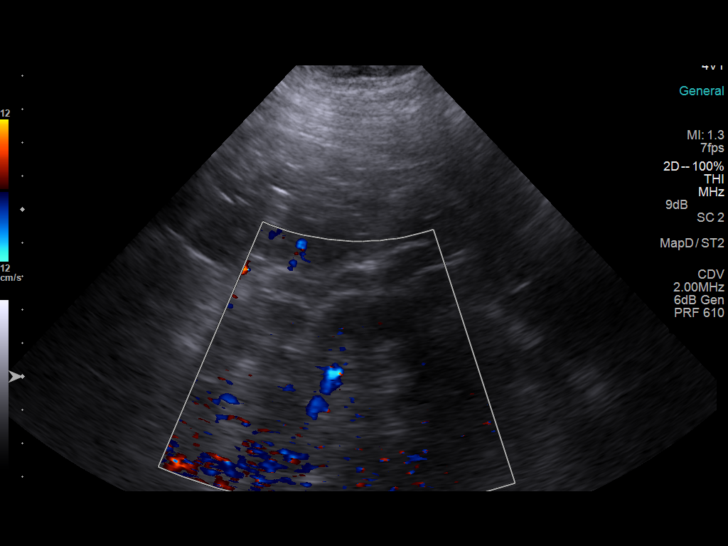

[14 of 25 positions shown; findings below may reference images not displayed]

FINDINGS: Right Kidney:

Length: 9.7 cm.. Echogenicity within normal limits. No mass or
hydronephrosis visualized.

Left Kidney:

Length: 9.2 cm.. Echogenicity within normal limits. No mass or
hydronephrosis visualized.

Bladder:

Decompressed following voiding
IMPRESSION: No acute abnormality noted.

## 2016-08-04 ENCOUNTER — Ambulatory Visit: Payer: Medicare Other | Admitting: Podiatry

## 2016-08-26 ENCOUNTER — Ambulatory Visit: Payer: Medicare Other | Admitting: Podiatry

## 2016-09-08 ENCOUNTER — Ambulatory Visit: Payer: Medicare Other | Admitting: Podiatry

## 2016-11-07 ENCOUNTER — Emergency Department (HOSPITAL_COMMUNITY): Payer: Medicare Other

## 2016-11-07 ENCOUNTER — Encounter (HOSPITAL_COMMUNITY): Payer: Self-pay | Admitting: Emergency Medicine

## 2016-11-07 ENCOUNTER — Emergency Department (HOSPITAL_COMMUNITY)
Admission: EM | Admit: 2016-11-07 | Discharge: 2016-11-07 | Disposition: A | Discharge status: Home / Self Care | Payer: Medicare Other | Attending: Emergency Medicine | Admitting: Emergency Medicine

## 2016-11-07 DIAGNOSIS — Z794 Long term (current) use of insulin: Secondary | ICD-10-CM | POA: Diagnosis not present

## 2016-11-07 DIAGNOSIS — Z7982 Long term (current) use of aspirin: Secondary | ICD-10-CM | POA: Insufficient documentation

## 2016-11-07 DIAGNOSIS — I5032 Chronic diastolic (congestive) heart failure: Secondary | ICD-10-CM | POA: Insufficient documentation

## 2016-11-07 DIAGNOSIS — R42 Dizziness and giddiness: Secondary | ICD-10-CM | POA: Diagnosis not present

## 2016-11-07 DIAGNOSIS — Z79899 Other long term (current) drug therapy: Secondary | ICD-10-CM | POA: Diagnosis not present

## 2016-11-07 DIAGNOSIS — I130 Hypertensive heart and chronic kidney disease with heart failure and stage 1 through stage 4 chronic kidney disease, or unspecified chronic kidney disease: Secondary | ICD-10-CM | POA: Diagnosis not present

## 2016-11-07 DIAGNOSIS — N184 Chronic kidney disease, stage 4 (severe): Secondary | ICD-10-CM | POA: Diagnosis not present

## 2016-11-07 DIAGNOSIS — E1122 Type 2 diabetes mellitus with diabetic chronic kidney disease: Secondary | ICD-10-CM | POA: Insufficient documentation

## 2016-11-07 DIAGNOSIS — I10 Essential (primary) hypertension: Secondary | ICD-10-CM

## 2016-11-07 DIAGNOSIS — R404 Transient alteration of awareness: Secondary | ICD-10-CM | POA: Diagnosis not present

## 2016-11-07 DIAGNOSIS — R29818 Other symptoms and signs involving the nervous system: Secondary | ICD-10-CM | POA: Diagnosis not present

## 2016-11-07 LAB — DIFFERENTIAL
Basophils Absolute: 0 10*3/uL (ref 0.0–0.1)
Basophils Relative: 0 %
Eosinophils Absolute: 0 10*3/uL (ref 0.0–0.7)
Eosinophils Relative: 0 %
LYMPHS PCT: 23 %
Lymphs Abs: 0.9 10*3/uL (ref 0.7–4.0)
MONO ABS: 0.3 10*3/uL (ref 0.1–1.0)
MONOS PCT: 8 %
NEUTROS ABS: 2.6 10*3/uL (ref 1.7–7.7)
Neutrophils Relative %: 69 %

## 2016-11-07 LAB — COMPREHENSIVE METABOLIC PANEL
ALK PHOS: 117 U/L (ref 38–126)
ALT: 12 U/L — AB (ref 14–54)
AST: 15 U/L (ref 15–41)
Albumin: 3.8 g/dL (ref 3.5–5.0)
Anion gap: 7 (ref 5–15)
BUN: 23 mg/dL — AB (ref 6–20)
CALCIUM: 8.6 mg/dL — AB (ref 8.9–10.3)
CHLORIDE: 108 mmol/L (ref 101–111)
CO2: 23 mmol/L (ref 22–32)
CREATININE: 2.22 mg/dL — AB (ref 0.44–1.00)
GFR, EST AFRICAN AMERICAN: 25 mL/min — AB (ref >60)
GFR, EST NON AFRICAN AMERICAN: 21 mL/min — AB (ref >60)
Glucose, Bld: 212 mg/dL — ABNORMAL HIGH (ref 65–99)
Potassium: 4.5 mmol/L (ref 3.5–5.1)
SODIUM: 138 mmol/L (ref 135–145)
Total Bilirubin: 0.5 mg/dL (ref 0.3–1.2)
Total Protein: 6.9 g/dL (ref 6.5–8.1)

## 2016-11-07 LAB — CBC
HCT: 32.5 % — ABNORMAL LOW (ref 36.0–46.0)
Hemoglobin: 10.7 g/dL — ABNORMAL LOW (ref 12.0–15.0)
MCH: 29.9 pg (ref 26.0–34.0)
MCHC: 32.9 g/dL (ref 30.0–36.0)
MCV: 90.8 fL (ref 78.0–100.0)
PLATELETS: 218 10*3/uL (ref 150–400)
RBC: 3.58 MIL/uL — ABNORMAL LOW (ref 3.87–5.11)
RDW: 12.3 % (ref 11.5–15.5)
WBC: 3.8 10*3/uL — ABNORMAL LOW (ref 4.0–10.5)

## 2016-11-07 LAB — PROTIME-INR
INR: 1.01
PROTHROMBIN TIME: 13.2 s (ref 11.4–15.2)

## 2016-11-07 LAB — CBG MONITORING, ED: Glucose-Capillary: 181 mg/dL — ABNORMAL HIGH (ref 65–99)

## 2016-11-07 LAB — I-STAT TROPONIN, ED: Troponin i, poc: 0.03 ng/mL (ref 0.00–0.08)

## 2016-11-07 LAB — APTT: aPTT: 26 seconds (ref 24–36)

## 2016-11-07 MED ORDER — HYDRALAZINE HCL 20 MG/ML IJ SOLN
10.0000 mg | INTRAMUSCULAR | Status: AC
  Administered 2016-11-07: 10 mg via INTRAVENOUS
  Filled 2016-11-07: qty 1

## 2016-11-07 MED ORDER — HYDRALAZINE HCL 25 MG PO TABS
25.0000 mg | ORAL_TABLET | Freq: Once | ORAL | Status: AC
  Administered 2016-11-07: 25 mg via ORAL
  Filled 2016-11-07: qty 1

## 2016-11-07 NOTE — ED Provider Notes (Signed)
[facility] DEPT Provider Note   CSN: [id number] Arrival date & time: 11/07/16  1636     History   Chief Complaint Chief Complaint  Patient presents with  . Dizziness    HPI [name] is a 71 y.o. female.  [name] is 71 y.o. Female who presents to the emergency department complaining of lightheadedness with position change beginning this morning. Patient reports when she changes position or walks for long distance she begins to feel lightheaded like she might pass out. She denies having any chest pain or shortness of breath. She denies any room spinning dizziness. She denies any headache. She tells me that since this form her TV has not been working. She does have power in her house. She believes that not having the TV may be contributing to her symptoms. She has taken nothing for treatment of her symptoms. She denies any urinary symptoms. No changes to her urination. No decreased urination. She denies fevers, coughing, chest pain, shortness of breath, abdominal pain, nausea, vomiting, diarrhea, numbness, tingling, weakness, headache, rashes, room spinning dizziness, ear pain, sore throat, changes to her vision or double vision.   The history is provided by the patient and medical records. No language interpreter was used.  Dizziness  Associated symptoms: no chest pain, no diarrhea, no headaches, no nausea, no palpitations, no shortness of breath, no vomiting and no weakness     Past Medical History:  Diagnosis Date  . Anemia   . CHF (congestive heart failure) ([place])   . CKD (chronic kidney disease), stage IV ([place])   . Depression   . DVT (deep venous thrombosis) ([place])   . Essential hypertension   . GERD (gastroesophageal reflux disease)   . Hyperlipidemia   . Morbid obesity ([place])   . Seizures ([place])    "epilepsy; they think I had one a couple months ago" (12/11/2014)  . Type II diabetes mellitus [facility])     Patient Active Problem List   Diagnosis Date Noted  .  DVT (deep venous thrombosis) ([place]) 06/30/2015  . Chronic diastolic (congestive) heart failure ([place]) 06/30/2015  . Bradycardia with 31 - 40 beats per minute   . Emesis   . Junctional bradycardia   . Arterial embolism and thrombosis of upper extremity ([place])   . Bradycardia 12/11/2014  . Acute renal failure superimposed on stage 3 chronic kidney disease ([place]) 12/11/2014  . Vomiting 12/11/2014  . Acute renal failure ([place]) 12/11/2014  . CKD (chronic kidney disease), stage IV ([place])   . Essential hypertension   . Type II diabetes mellitus ([place])   . OSA (obstructive sleep apnea) 10/17/2012  . Elevated troponin 09/26/2012  . Acute encephalopathy 09/25/2012  . CKD (chronic kidney disease) stage 3, GFR 30-59 ml/min (HCC) 09/25/2012  . Tremor 09/25/2012  . Rhabdomyolysis 09/25/2012  . Morbid obesity ([place]) 09/16/2006  . DEPRESSION, CHRONIC 09/16/2006  . Seizure ([place]) 09/16/2006  . Diabetes mellitus ([place]) 09/09/2006  . Hyperlipidemia 09/09/2006  . HYPERTENSION, BENIGN 09/09/2006  . CONSTIPATION, HX OF 09/09/2006    Past Surgical History:  Procedure Laterality Date  . THROMBECTOMY BRACHIAL ARTERY Left 12/15/2014   Procedure: THROMBECTOMY BRACHIAL ARTERY; VEIN PATCH ANGIOPLASTY LEFT BRACIAL ARTERY;  Surgeon: Angelia Mould, MD;  Location: [facility];  Service: Vascular;  Laterality: Left;  . TUBAL LIGATION      OB History    No data available       Home Medications    Prior to Admission medications   Medication Sig  Start Date End Date Taking? Authorizing Provider  Ascorbic Acid (VITAMIN C PO) Take 1 tablet by mouth daily.   Yes [provider]  aspirin 81 MG chewable tablet Chew 1 tablet (81 mg total) by mouth daily. 12/21/14  Yes Rai, Ripudeep K, MD  bisacodyl (DULCOLAX) 5 MG EC tablet Take 2 tablets (10 mg total) by mouth every morning. 12/21/14  Yes Rai, Ripudeep K, MD  carbamazepine (TEGRETOL) 200 MG tablet Take 400 mg by mouth 2 (two) times daily.    Yes [provider]  Cholecalciferol (VITAMIN D-3) 1000 UNITS CAPS Take 1,000 Units by mouth daily at 12 noon.    Yes [provider]  citalopram (CELEXA) 20 MG tablet Take 20 mg by mouth daily.   Yes [provider]  ezetimibe-simvastatin (VYTORIN) 10-40 MG per tablet Take 1 tablet by mouth daily.   Yes [provider]  furosemide (LASIX) 80 MG tablet Take 2 tablets (160 mg total) by mouth 2 (two) times daily. Patient taking differently: Take 40 mg by mouth daily.  12/21/14  Yes Rai, Ripudeep K, MD  glipiZIDE (GLUCOTROL XL) 5 MG 24 hr tablet Take 5 mg by mouth daily with breakfast.   Yes [provider]  HUMULIN 70/30 KWIKPEN (70-30) 100 UNIT/ML PEN Inject 20 Units into the skin daily.  10/18/14  Yes [provider]  hydrALAZINE (APRESOLINE) 25 MG tablet Take 1 tablet (25 mg total) by mouth 2 (two) times daily. 12/06/15  Yes Robyn Haber, MD  omeprazole (PRILOSEC) 40 MG capsule Take 40 mg by mouth daily.   Yes [provider]    Family History Family History  Problem Relation Age of Onset  . Hypertension Mother        died @ 12 - unknown cause.  [name] Hypertension Father        says she doesn't know anything about her father.  . Cancer Maternal Aunt   . Seizures Neg Hx     Social History Social History  Substance Use Topics  . Smoking status: Never Smoker  . Smokeless tobacco: Never Used     Comment: EXPOSED TO 2ND HAND SMOKE X 20+ YEARS.   [name] Alcohol use No     Allergies   Codeine   Review of Systems Review of Systems  Constitutional: Negative for chills and fever.  HENT: Negative for congestion, ear pain and sore throat.   Eyes: Negative for photophobia, pain and visual disturbance.  Respiratory: Negative for cough and shortness of breath.   Cardiovascular: Negative for chest pain, palpitations and leg swelling.  Gastrointestinal: Negative for abdominal pain, diarrhea, nausea and vomiting.  Genitourinary: Negative for  decreased urine volume, dysuria and frequency.  Musculoskeletal: Negative for back pain and neck pain.  Skin: Negative for rash.  Neurological: Positive for light-headedness. Negative for dizziness, syncope, weakness, numbness and headaches.     Physical Exam Updated Vital Signs BP (!) 161/86   Pulse 68   Temp 98.6 F (37 C)   Resp 13   SpO2 98%   Physical Exam  Constitutional: She is oriented to person, place, and time. She appears well-developed and well-nourished. No distress.  Nontoxic appearing. Obese female.  HENT:  Head: Normocephalic and atraumatic.  Right Ear: External ear normal.  Left Ear: External ear normal.  Mouth/Throat: Oropharynx is clear and moist.  Eyes: Pupils are equal, round, and reactive to light. Conjunctivae and EOM are normal. Right eye exhibits no discharge. Left eye exhibits no discharge.  Neck:  Normal range of motion. Neck supple. No JVD present.  Cardiovascular: Normal rate, regular rhythm, normal heart sounds and intact distal pulses.  Exam reveals no gallop and no friction rub.   No murmur heard. Pulmonary/Chest: Effort normal and breath sounds normal. No stridor. No respiratory distress. She has no wheezes. She has no rales.  Lungs are clear to ascultation bilaterally. Symmetric chest expansion bilaterally. No increased work of breathing. No rales or rhonchi.    Abdominal: Soft. There is no tenderness. There is no guarding.  Musculoskeletal: Normal range of motion. She exhibits no edema or tenderness.  No LE edema or TTP.   Lymphadenopathy:    She has no cervical adenopathy.  Neurological: She is alert and oriented to person, place, and time. No cranial nerve deficit or sensory deficit. She exhibits normal muscle tone. Coordination normal.  Patient is alert and oriented 3. Speech is clear and coherent. Cranial nerves are intact. Sensation and strength is intact to bilateral upper and lower extremities. EOMs are intact. Vision is grossly intact. No  pronator drift. Finger-to-nose intact bilaterally.  Skin: Skin is warm and dry. No rash noted. She is not diaphoretic. No erythema. No pallor.  Psychiatric: She has a normal mood and affect. Her behavior is normal.  Nursing note and vitals reviewed.    ED Treatments / Results  Labs (all labs ordered are listed, but only abnormal results are displayed) Labs Reviewed  CBC - Abnormal; Notable for the following:       Result Value   WBC 3.8 (*)    RBC 3.58 (*)    Hemoglobin 10.7 (*)    HCT 32.5 (*)    All other components within normal limits  COMPREHENSIVE METABOLIC PANEL - Abnormal; Notable for the following:    Glucose, Bld 212 (*)    BUN 23 (*)    Creatinine, Ser 2.22 (*)    Calcium 8.6 (*)    ALT 12 (*)    GFR calc non Af Amer 21 (*)    GFR calc Af Amer 25 (*)    All other components within normal limits  CBG MONITORING, ED - Abnormal; Notable for the following:    Glucose-Capillary 181 (*)    All other components within normal limits  PROTIME-INR  APTT  DIFFERENTIAL  I-STAT TROPONIN, ED    EKG  EKG Interpretation  Date/Time:  Sunday November 07 2016 16:43:59 EDT Ventricular Rate:  78 PR Interval:  216 QRS Duration: 88 QT Interval:  400 QTC Calculation: 456 R Axis:   -37 Text Interpretation:  Sinus rhythm with 1st degree A-V block Left axis deviation Artifact ST-t wave abnormality Abnormal ekg Confirmed by Carmin Muskrat [phone]) on 11/07/2016 6:57:02 PM       Radiology Ct Head Wo Contrast  Result Date: 11/07/2016 CLINICAL DATA:  Neuro deficit for greater than 6 hours EXAM: CT HEAD WITHOUT CONTRAST TECHNIQUE: Contiguous axial images were obtained from the base of the skull through the vertex without intravenous contrast. COMPARISON:  09/24/2014 FINDINGS: Brain: Mild chronic white matter ischemic change and mild atrophic change is seen. The overall appearance is similar to that noted on the prior exam. No findings to suggest acute hemorrhage, acute infarction or  space-occupying mass lesion are noted. Vascular: No hyperdense vessel or unexpected calcification. Skull: Normal. Negative for fracture or focal lesion. Sinuses/Orbits: No acute finding. Other: None. IMPRESSION: Chronic changes without acute abnormality. Electronically Signed   By: Inez Catalina M.D.   On: 11/07/2016 18:13  Procedures Procedures (including critical care time)  Medications Ordered in ED Medications  hydrALAZINE (APRESOLINE) tablet 25 mg (25 mg Oral Given 11/07/16 1902)  hydrALAZINE (APRESOLINE) injection 10 mg (10 mg Intravenous Given 11/07/16 2119)     Initial Impression / Assessment and Plan / ED Course  I have reviewed the triage vital signs and the nursing notes.  Pertinent labs & imaging results that were available during my care of the patient were reviewed by me and considered in my medical decision making (see chart for details).     This  is 71 y.o. Female who presents to the emergency department complaining of lightheadedness with position change beginning this morning. Patient reports when she changes position or walks for long distance she begins to feel lightheaded like she might pass out. She denies having any chest pain or shortness of breath. She denies any room spinning dizziness. She denies any headache. She tells me that since this form her TV has not been working. She does have power in her house. She believes that not having the TV may be contributing to her symptoms. She has taken nothing for treatment of her symptoms. On exam the patient is afebrile nontoxic appearing. She has no focal neurological deficits on exam. She relates with normal gait. She has no difficulty with cannulation, however she reports feeling lightheaded with ambulation. She is hypertensive with a blood pressure of 710 systolic on initial evaluation. Blood work here is reassuring. No elevated troponin. EKG without STEMI. CMP shows an elevated creatinine of 2.22 which is an improvement  from her baseline. CT head is unremarkable. Patient received oral 25 mg of hydralazine as well as 10 of IV hydralazine and this improved her blood pressure to the 626R systolic. She ambulated again and reports she no longer felt lightheaded. She feels well and ready for discharge. Appears reassuring. She needs close follow-up for repeat blood pressure check. Return precautions discussed. I advised the patient to follow-up with their primary care provider this week. I advised the patient to return to the emergency department with new or worsening symptoms or new concerns. The patient verbalized understanding and agreement with plan.   This patient was discussed with and evaluated by Dr. Vanita Panda who agrees with assessment and plan.    Final Clinical Impressions(s) / ED Diagnoses   Final diagnoses:  Lightheadedness  Essential hypertension    New Prescriptions Discharge Medication List as of 11/07/2016 11:26 PM       Waynetta Pean, PA-C 11/08/16 0030    Carmin Muskrat, MD 11/11/16 1149

## 2016-11-07 NOTE — Discharge Instructions (Signed)
Please follow up with your doctor to recheck your blood pressure.

## 2016-11-07 NOTE — ED Triage Notes (Signed)
Pt to triage via [facility]> Reports dizziness since waking up this morning.  Denies pain.  No neuro deficits noted on triage exam.  No arm or leg drift.

## 2016-11-07 NOTE — ED Notes (Signed)
Dinner tray ordered; regular diet 

## 2016-11-09 ENCOUNTER — Ambulatory Visit: Payer: Medicare Other | Admitting: Podiatry

## 2016-11-27 ENCOUNTER — Encounter (HOSPITAL_COMMUNITY): Payer: Self-pay

## 2016-11-27 ENCOUNTER — Emergency Department (HOSPITAL_COMMUNITY): Payer: Medicare Other

## 2016-11-27 ENCOUNTER — Inpatient Hospital Stay (HOSPITAL_COMMUNITY)
Admission: EM | Admit: 2016-11-27 | Discharge: 2016-11-30 | DRG: 309 | Disposition: A | Discharge status: Home / Self Care | Payer: Medicare Other | Attending: Internal Medicine | Admitting: Internal Medicine

## 2016-11-27 DIAGNOSIS — E1122 Type 2 diabetes mellitus with diabetic chronic kidney disease: Secondary | ICD-10-CM | POA: Diagnosis present

## 2016-11-27 DIAGNOSIS — K219 Gastro-esophageal reflux disease without esophagitis: Secondary | ICD-10-CM | POA: Diagnosis present

## 2016-11-27 DIAGNOSIS — Z885 Allergy status to narcotic agent status: Secondary | ICD-10-CM | POA: Diagnosis not present

## 2016-11-27 DIAGNOSIS — E119 Type 2 diabetes mellitus without complications: Secondary | ICD-10-CM

## 2016-11-27 DIAGNOSIS — R5383 Other fatigue: Secondary | ICD-10-CM | POA: Diagnosis not present

## 2016-11-27 DIAGNOSIS — Z79899 Other long term (current) drug therapy: Secondary | ICD-10-CM

## 2016-11-27 DIAGNOSIS — I129 Hypertensive chronic kidney disease with stage 1 through stage 4 chronic kidney disease, or unspecified chronic kidney disease: Secondary | ICD-10-CM | POA: Diagnosis present

## 2016-11-27 DIAGNOSIS — N185 Chronic kidney disease, stage 5: Secondary | ICD-10-CM | POA: Diagnosis not present

## 2016-11-27 DIAGNOSIS — E875 Hyperkalemia: Secondary | ICD-10-CM | POA: Diagnosis not present

## 2016-11-27 DIAGNOSIS — J95859 Other complication of respirator [ventilator]: Secondary | ICD-10-CM | POA: Diagnosis not present

## 2016-11-27 DIAGNOSIS — G40909 Epilepsy, unspecified, not intractable, without status epilepticus: Secondary | ICD-10-CM | POA: Diagnosis present

## 2016-11-27 DIAGNOSIS — T447X5A Adverse effect of beta-adrenoreceptor antagonists, initial encounter: Secondary | ICD-10-CM | POA: Diagnosis not present

## 2016-11-27 DIAGNOSIS — E785 Hyperlipidemia, unspecified: Secondary | ICD-10-CM | POA: Diagnosis present

## 2016-11-27 DIAGNOSIS — Z794 Long term (current) use of insulin: Secondary | ICD-10-CM | POA: Diagnosis not present

## 2016-11-27 DIAGNOSIS — N179 Acute kidney failure, unspecified: Secondary | ICD-10-CM | POA: Diagnosis present

## 2016-11-27 DIAGNOSIS — F329 Major depressive disorder, single episode, unspecified: Secondary | ICD-10-CM | POA: Diagnosis present

## 2016-11-27 DIAGNOSIS — R0602 Shortness of breath: Secondary | ICD-10-CM | POA: Diagnosis not present

## 2016-11-27 DIAGNOSIS — E1100 Type 2 diabetes mellitus with hyperosmolarity without nonketotic hyperglycemic-hyperosmolar coma (NKHHC): Secondary | ICD-10-CM | POA: Diagnosis not present

## 2016-11-27 DIAGNOSIS — R269 Unspecified abnormalities of gait and mobility: Secondary | ICD-10-CM | POA: Diagnosis not present

## 2016-11-27 DIAGNOSIS — Z8249 Family history of ischemic heart disease and other diseases of the circulatory system: Secondary | ICD-10-CM | POA: Diagnosis not present

## 2016-11-27 DIAGNOSIS — R001 Bradycardia, unspecified: Principal | ICD-10-CM | POA: Diagnosis present

## 2016-11-27 DIAGNOSIS — Z6841 Body Mass Index (BMI) 40.0 and over, adult: Secondary | ICD-10-CM

## 2016-11-27 DIAGNOSIS — I10 Essential (primary) hypertension: Secondary | ICD-10-CM | POA: Diagnosis not present

## 2016-11-27 DIAGNOSIS — Z7982 Long term (current) use of aspirin: Secondary | ICD-10-CM | POA: Diagnosis not present

## 2016-11-27 DIAGNOSIS — R030 Elevated blood-pressure reading, without diagnosis of hypertension: Secondary | ICD-10-CM | POA: Diagnosis not present

## 2016-11-27 DIAGNOSIS — Z888 Allergy status to other drugs, medicaments and biological substances status: Secondary | ICD-10-CM

## 2016-11-27 DIAGNOSIS — N184 Chronic kidney disease, stage 4 (severe): Secondary | ICD-10-CM | POA: Diagnosis present

## 2016-11-27 DIAGNOSIS — R531 Weakness: Secondary | ICD-10-CM | POA: Diagnosis present

## 2016-11-27 DIAGNOSIS — N2889 Other specified disorders of kidney and ureter: Secondary | ICD-10-CM | POA: Diagnosis not present

## 2016-11-27 HISTORY — DX: Bradycardia, unspecified: R00.1

## 2016-11-27 LAB — BASIC METABOLIC PANEL
Anion gap: 6 (ref 5–15)
BUN: 33 mg/dL — ABNORMAL HIGH (ref 6–20)
CALCIUM: 8.1 mg/dL — AB (ref 8.9–10.3)
CHLORIDE: 104 mmol/L (ref 101–111)
CO2: 22 mmol/L (ref 22–32)
CREATININE: 2.49 mg/dL — AB (ref 0.44–1.00)
GFR calc non Af Amer: 19 mL/min — ABNORMAL LOW (ref >60)
GFR, EST AFRICAN AMERICAN: 22 mL/min — AB (ref >60)
GLUCOSE: 119 mg/dL — AB (ref 65–99)
Potassium: 5.2 mmol/L — ABNORMAL HIGH (ref 3.5–5.1)
Sodium: 132 mmol/L — ABNORMAL LOW (ref 135–145)

## 2016-11-27 LAB — CBC
HEMATOCRIT: 31.7 % — AB (ref 36.0–46.0)
HEMOGLOBIN: 10.4 g/dL — AB (ref 12.0–15.0)
MCH: 29.8 pg (ref 26.0–34.0)
MCHC: 32.8 g/dL (ref 30.0–36.0)
MCV: 90.8 fL (ref 78.0–100.0)
Platelets: 235 10*3/uL (ref 150–400)
RBC: 3.49 MIL/uL — ABNORMAL LOW (ref 3.87–5.11)
RDW: 11.9 % (ref 11.5–15.5)
WBC: 3.8 10*3/uL — ABNORMAL LOW (ref 4.0–10.5)

## 2016-11-27 LAB — I-STAT TROPONIN, ED: Troponin i, poc: 0.02 ng/mL (ref 0.00–0.08)

## 2016-11-27 LAB — GLUCOSE, CAPILLARY: GLUCOSE-CAPILLARY: 141 mg/dL — AB (ref 65–99)

## 2016-11-27 LAB — POTASSIUM: Potassium: 5.4 mmol/L — ABNORMAL HIGH (ref 3.5–5.1)

## 2016-11-27 MED ORDER — ACETAMINOPHEN 650 MG RE SUPP: 650.0000 mg | Freq: Four times a day (QID) | RECTAL | Status: DC | PRN

## 2016-11-27 MED ORDER — ENOXAPARIN SODIUM 30 MG/0.3ML ~~LOC~~ SOLN
30.0000 mg | SUBCUTANEOUS | Status: DC
  Administered 2016-11-28 – 2016-11-30 (×3): 30 mg via SUBCUTANEOUS
  Filled 2016-11-27 (×3): qty 0.3

## 2016-11-27 MED ORDER — HYDRALAZINE HCL 25 MG PO TABS
25.0000 mg | ORAL_TABLET | Freq: Two times a day (BID) | ORAL | Status: DC
  Administered 2016-11-28 – 2016-11-30 (×4): 25 mg via ORAL
  Filled 2016-11-27 (×5): qty 1

## 2016-11-27 MED ORDER — ASPIRIN 81 MG PO CHEW
81.0000 mg | CHEWABLE_TABLET | Freq: Every day | ORAL | Status: DC
  Administered 2016-11-28 – 2016-11-30 (×3): 81 mg via ORAL
  Filled 2016-11-27 (×3): qty 1

## 2016-11-27 MED ORDER — CARBAMAZEPINE 200 MG PO TABS
400.0000 mg | ORAL_TABLET | Freq: Two times a day (BID) | ORAL | Status: DC
  Administered 2016-11-27 – 2016-11-30 (×6): 400 mg via ORAL
  Filled 2016-11-27 (×6): qty 2

## 2016-11-27 MED ORDER — ONDANSETRON HCL 4 MG PO TABS: 4.0000 mg | ORAL_TABLET | Freq: Four times a day (QID) | ORAL | Status: DC | PRN

## 2016-11-27 MED ORDER — INSULIN ASPART 100 UNIT/ML ~~LOC~~ SOLN
0.0000 [IU] | Freq: Three times a day (TID) | SUBCUTANEOUS | Status: DC
  Administered 2016-11-28: 5 [IU] via SUBCUTANEOUS
  Administered 2016-11-29: 2 [IU] via SUBCUTANEOUS
  Administered 2016-11-29: 3 [IU] via SUBCUTANEOUS
  Administered 2016-11-29: 2 [IU] via SUBCUTANEOUS
  Administered 2016-11-30: 5 [IU] via SUBCUTANEOUS
  Administered 2016-11-30 (×2): 3 [IU] via SUBCUTANEOUS

## 2016-11-27 MED ORDER — CITALOPRAM HYDROBROMIDE 20 MG PO TABS
20.0000 mg | ORAL_TABLET | Freq: Every day | ORAL | Status: DC
  Administered 2016-11-28 – 2016-11-30 (×3): 20 mg via ORAL
  Filled 2016-11-27 (×3): qty 1

## 2016-11-27 MED ORDER — CITALOPRAM HYDROBROMIDE 10 MG PO TABS: 20.0000 mg | ORAL_TABLET | Freq: Every day | ORAL | Status: DC

## 2016-11-27 MED ORDER — FUROSEMIDE 40 MG PO TABS
40.0000 mg | ORAL_TABLET | Freq: Every day | ORAL | Status: DC
  Administered 2016-11-28 – 2016-11-29 (×2): 40 mg via ORAL
  Filled 2016-11-27 (×2): qty 1

## 2016-11-27 MED ORDER — ACETAMINOPHEN 325 MG PO TABS
650.0000 mg | ORAL_TABLET | Freq: Four times a day (QID) | ORAL | Status: DC | PRN
  Administered 2016-11-30: 650 mg via ORAL
  Filled 2016-11-27: qty 2

## 2016-11-27 MED ORDER — ONDANSETRON HCL 4 MG/2ML IJ SOLN: 4.0000 mg | Freq: Four times a day (QID) | INTRAMUSCULAR | Status: DC | PRN

## 2016-11-27 MED ORDER — SODIUM CHLORIDE 0.9 % IV BOLUS (SEPSIS)
500.0000 mL | Freq: Once | INTRAVENOUS | Status: AC
  Administered 2016-11-27: 500 mL via INTRAVENOUS

## 2016-11-27 MED ORDER — BISACODYL 5 MG PO TBEC: 10.0000 mg | DELAYED_RELEASE_TABLET | Freq: Every day | ORAL | Status: DC | PRN

## 2016-11-27 MED ORDER — AMLODIPINE BESYLATE 10 MG PO TABS
10.0000 mg | ORAL_TABLET | Freq: Every day | ORAL | Status: DC
  Administered 2016-11-28 – 2016-11-30 (×3): 10 mg via ORAL
  Filled 2016-11-27 (×3): qty 1

## 2016-11-27 MED ORDER — EZETIMIBE-SIMVASTATIN 10-40 MG PO TABS
1.0000 | ORAL_TABLET | Freq: Every day | ORAL | Status: DC
  Administered 2016-11-28 – 2016-11-30 (×3): 1 via ORAL
  Filled 2016-11-27 (×3): qty 1

## 2016-11-27 MED ORDER — FUROSEMIDE 20 MG PO TABS: 40.0000 mg | ORAL_TABLET | Freq: Every day | ORAL | Status: DC

## 2016-11-27 MED ORDER — PANTOPRAZOLE SODIUM 40 MG PO TBEC
80.0000 mg | DELAYED_RELEASE_TABLET | Freq: Every day | ORAL | Status: DC
  Administered 2016-11-28 – 2016-11-30 (×3): 80 mg via ORAL
  Filled 2016-11-27 (×3): qty 2

## 2016-11-27 NOTE — ED Provider Notes (Signed)
[facility] EMERGENCY DEPARTMENT Provider Note   CSN: [id number] Arrival date & time: 11/27/16  1808     History   Chief Complaint Chief Complaint  Patient presents with  . Shortness of Breath    HPI [name] is a 71 y.o. female.  HPI 71 year old female presents today stating that she had onset of weakness that began today.  She denies any associated dyspnea, headache, head injury, chest pain, nausea, vomiting, fever, or chills. (Although nurse's notes state shortness of breath) she denies having similar symptoms in the past.  She associates this with new medicine that she began taking on Wednesday or Thursday.  She reports that Lopressor was added to her regimen by her primary care doctor. Past Medical History:  Diagnosis Date  . Anemia   . CHF (congestive heart failure) ([place])   . CKD (chronic kidney disease), stage IV ([place])   . Depression   . DVT (deep venous thrombosis) ([place])   . Essential hypertension   . GERD (gastroesophageal reflux disease)   . Hyperlipidemia   . Morbid obesity ([place])   . Seizures ([place])    "epilepsy; they think I had one a couple months ago" (12/11/2014)  . Type II diabetes mellitus [facility])     Patient Active Problem List   Diagnosis Date Noted  . DVT (deep venous thrombosis) ([place]) 06/30/2015  . Chronic diastolic (congestive) heart failure ([place]) 06/30/2015  . Bradycardia with 31 - 40 beats per minute   . Emesis   . Junctional bradycardia   . Arterial embolism and thrombosis of upper extremity ([place])   . Bradycardia 12/11/2014  . Acute renal failure superimposed on stage 3 chronic kidney disease ([place]) 12/11/2014  . Vomiting 12/11/2014  . Acute renal failure ([place]) 12/11/2014  . CKD (chronic kidney disease), stage IV ([place])   . Essential hypertension   . Type II diabetes mellitus ([place])   . OSA (obstructive sleep apnea) 10/17/2012  . Elevated troponin 09/26/2012  . Acute encephalopathy 09/25/2012  . CKD (chronic kidney  disease) stage 3, GFR 30-59 ml/min (H[facility]) 09/25/2012  . Tremor 09/25/2012  . Rhabdomyolysis 09/25/2012  . Morbid obesity ([place]) 09/16/2006  . DEPRESSION, CHRONIC 09/16/2006  . Seizure ([place]) 09/16/2006  . Diabetes mellitus ([place]) 09/09/2006  . Hyperlipidemia 09/09/2006  . HYPERTENSION, BENIGN 09/09/2006  . CONSTIPATION, HX OF 09/09/2006    Past Surgical History:  Procedure Laterality Date  . THROMBECTOMY BRACHIAL ARTERY Left 12/15/2014   Procedure: THROMBECTOMY BRACHIAL ARTERY; VEIN PATCH ANGIOPLASTY LEFT BRACIAL ARTERY;  Surgeon: Angelia Mould, MD;  Location: [facility];  Service: Vascular;  Laterality: Left;  . TUBAL LIGATION      OB History    No data available       Home Medications    Prior to Admission medications   Medication Sig Start Date End Date Taking? Authorizing Provider  Ascorbic Acid (VITAMIN C PO) Take 1 tablet by mouth daily.    [provider]  aspirin 81 MG chewable tablet Chew 1 tablet (81 mg total) by mouth daily. 12/21/14   Rai, Ripudeep Raliegh Ip, MD  bisacodyl (DULCOLAX) 5 MG EC tablet Take 2 tablets (10 mg total) by mouth every morning. 12/21/14   Rai, Vernelle Emerald, MD  carbamazepine (TEGRETOL) 200 MG tablet Take 400 mg by mouth 2 (two) times daily.     [provider]  Cholecalciferol (VITAMIN D-3) 1000 UNITS CAPS Take 1,000 Units by mouth daily at 12 noon.     [provider]  citalopram (CELEXA) 20 MG tablet Take 20 mg by mouth daily.    [provider]  ezetimibe-simvastatin (VYTORIN) 10-40 MG per tablet Take 1 tablet by mouth daily.    [provider]  furosemide (LASIX) 80 MG tablet Take 2 tablets (160 mg total) by mouth 2 (two) times daily. Patient taking differently: Take 40 mg by mouth daily.  12/21/14   Rai, Ripudeep K, MD  glipiZIDE (GLUCOTROL XL) 5 MG 24 hr tablet Take 5 mg by mouth daily with breakfast.    [provider]  HUMULIN 70/30 KWIKPEN (70-30) 100 UNIT/ML PEN Inject 20 Units into the skin  daily.  10/18/14   [provider]  hydrALAZINE (APRESOLINE) 25 MG tablet Take 1 tablet (25 mg total) by mouth 2 (two) times daily. 12/06/15   Robyn Haber, MD  omeprazole (PRILOSEC) 40 MG capsule Take 40 mg by mouth daily.    [provider]    Family History Family History  Problem Relation Age of Onset  . Hypertension Mother        died @ 45 - unknown cause.  [name] Hypertension Father        says she doesn't know anything about her father.  . Cancer Maternal Aunt   . Seizures Neg Hx     Social History Social History  Substance Use Topics  . Smoking status: Never Smoker  . Smokeless tobacco: Never Used     Comment: EXPOSED TO 2ND HAND SMOKE X 20+ YEARS.   [name] Alcohol use No     Allergies   Codeine   Review of Systems Review of Systems  All other systems reviewed and are negative.    Physical Exam Updated Vital Signs BP (!) 129/57 (BP Location: Left Arm)   Temp 98 F (36.7 C) (Oral)   Resp 16   Ht 1.575 m (5\' 2" )   Wt 104.3 kg (230 lb)   SpO2 99%   BMI 42.07 kg/m   Physical Exam  Constitutional: She is oriented to person, place, and time. She appears well-developed and well-nourished.  HENT:  Head: Normocephalic and atraumatic.  Right Ear: External ear normal.  Left Ear: External ear normal.  Mouth/Throat: Oropharynx is clear and moist.  Eyes: Pupils are equal, round, and reactive to light. EOM are normal.  Neck: Normal range of motion. Neck supple.  Cardiovascular: Bradycardia present.   Pulmonary/Chest: Effort normal and breath sounds normal.  Abdominal: Soft. Bowel sounds are normal.  Musculoskeletal: Normal range of motion. She exhibits no edema.  Neurological: She is alert and oriented to person, place, and time.  Skin: Skin is warm and dry. Capillary refill takes less than 2 seconds.  Psychiatric: She has a normal mood and affect.  Nursing note and vitals reviewed.    ED Treatments / Results  Labs (all labs ordered are  listed, but only abnormal results are displayed) Labs Reviewed  CBC  BASIC METABOLIC PANEL  I-STAT TROPONIN, ED    EKG  EKG Interpretation  Date/Time:  Saturday November 27 2016 18:18:02 EDT Ventricular Rate:  36 PR Interval:    QRS Duration: 98 QT Interval:  517 QTC Calculation: 400 R Axis:   13 Text Interpretation:  Junctional rhythm Probable anteroseptal infarct, old Confirmed by Pattricia Boss [phone]) on 11/27/2016 6:46:22 PM       Radiology Dg Chest Port 1 View  Result Date: 11/27/2016 CLINICAL DATA:  Pt arrived via via [facility] from home where pt reports having sudden onset of  SOB while at home with no respiratory hx. Pt also complains of weakness. Hx of diabetes and CHF. EXAM: PORTABLE CHEST 1 VIEW COMPARISON:  06/30/2015 FINDINGS: Cardiac silhouette mildly enlarged. No mediastinal hilar masses. No convincing adenopathy. Clear lungs.  No pleural effusion or pneumothorax. Skeletal structures are grossly intact. IMPRESSION: No active disease. Electronically Signed   By: Lajean Manes M.D.   On: 11/27/2016 19:34    Procedures Procedures (including critical care time)  Medications Ordered in ED Medications - No data to display   Initial Impression / Assessment and Plan / ED Course  I have reviewed the triage vital signs and the nursing notes.  Pertinent labs & imaging results that were available during my care of the patient were reviewed by me and considered in my medical decision making (see chart for details).     1- bradycardia- patient with junctional rhythm 35 here in ED c.w. Complaints of weakness.  Patient recently started on beta blocker- will hold 2- ckd- creatinine 2.47 appears stable. 3- anemia- stable  4- hyperkalemia-5.2- plan recheck Discussed with Dr. Teena Dunk on call for cardiology and advises hospitalist admit.  Will consult if needed.  Agrees with holding beta blocker and recheck of potassium  Final Clinical Impressions(s) / ED Diagnoses    1-  bradycardia- patient with junctional rhythm 35 here in ED c.w. Complaints of weakness.  Patient recently started on beta blocker- will hold 2- ckd- creatinine 2.47 appears stable. 3- anemia- stable  4- hyperkalemia-5.2- plan recheck Discussed with Dr. Teena Dunk on call for cardiology and advises hospitalist admit.  Will consult if needed.  Agrees with holding beta blocker and recheck of potassium  New Prescriptions New Prescriptions   No medications on file     Pattricia Boss, MD 11/27/16 2342

## 2016-11-27 NOTE — H&P (Signed)
History and Physical    [name]:[id number] DOB: 29-Jun-1945 DOA: 11/27/2016  PCP: Benito Mccreedy, MD  Patient coming from: Home  I have personally briefly reviewed patient's old medical records in [place]  Chief Complaint: Generalized weakness  HPI: [name] is a 71 y.o. female with medical history significant of CKD stage 4, DM2, HTN, obesity.  Patient presents to the ED with c/o generalized weakness.  Symptoms onset earlier today.  No associated SOB, fever, chills, nausea, vomiting.  She associates these symptoms with a new medicine (metoprolol) that she began taking on Wed or Thursday.  Unfortunately a review of her old records shows that she was admitted in 11/2014 due to bradycardia secondary to metoprolol, and again 06/2015 for bradycardia when she accidentally took left over metoprolol she had at home that didn't get thrown out after the first admit.   ED Course: HR is 35.   Review of Systems: As per HPI otherwise 10 point review of systems negative.   Past Medical History:  Diagnosis Date  . Anemia   . CHF (congestive heart failure) ([place])   . CKD (chronic kidney disease), stage IV ([place])   . Depression   . DVT (deep venous thrombosis) ([place])   . Essential hypertension   . GERD (gastroesophageal reflux disease)   . Hyperlipidemia   . Morbid obesity ([place])   . Seizures ([place])    "epilepsy; they think I had one a couple months ago" (12/11/2014)  . Type II diabetes mellitus ([place])     Past Surgical History:  Procedure Laterality Date  . THROMBECTOMY BRACHIAL ARTERY Left 12/15/2014   Procedure: THROMBECTOMY BRACHIAL ARTERY; VEIN PATCH ANGIOPLASTY LEFT BRACIAL ARTERY;  Surgeon: Angelia Mould, MD;  Location: [facility];  Service: Vascular;  Laterality: Left;  . TUBAL LIGATION       reports that she has never smoked. She has never used smokeless tobacco. She reports that she does not drink alcohol or use drugs.  Allergies  Allergen Reactions    . Codeine Nausea And Vomiting  . Metoprolol Other (See Comments)    Severe bradycardia.  Now getting admitted for 3rd time in 3 years for bradycardia onset after being started on toprol.  Please dont put patient on this med anymore!    Family History  Problem Relation Age of Onset  . Hypertension Mother        died @ 25 - unknown cause.  [name] Hypertension Father        says she doesn't know anything about her father.  . Cancer Maternal Aunt   . Seizures Neg Hx      Prior to Admission medications   Medication Sig Start Date End Date Taking? Authorizing Provider  amLODipine (NORVASC) 10 MG tablet Take 10 mg by mouth daily. 11/22/16  Yes [provider]  Ascorbic Acid (VITAMIN C PO) Take 1 tablet by mouth daily.   Yes [provider]  aspirin 81 MG chewable tablet Chew 1 tablet (81 mg total) by mouth daily. 12/21/14  Yes Rai, Ripudeep K, MD  bisacodyl (DULCOLAX) 5 MG EC tablet Take 2 tablets (10 mg total) by mouth every morning. Patient taking differently: Take 10 mg by mouth daily as needed.  12/21/14  Yes Rai, Ripudeep K, MD  carbamazepine (TEGRETOL) 200 MG tablet Take 400 mg by mouth 2 (two) times daily.    Yes [provider]  Cholecalciferol (VITAMIN D-3) 1000 UNITS CAPS Take 1,000 Units by mouth daily at  12 noon.    Yes [provider]  citalopram (CELEXA) 20 MG tablet Take 20 mg by mouth daily.   Yes [provider]  ezetimibe-simvastatin (VYTORIN) 10-40 MG per tablet Take 1 tablet by mouth daily.   Yes [provider]  furosemide (LASIX) 80 MG tablet Take 2 tablets (160 mg total) by mouth 2 (two) times daily. Patient taking differently: Take 40 mg by mouth daily.  12/21/14  Yes Rai, Ripudeep K, MD  glipiZIDE (GLUCOTROL XL) 5 MG 24 hr tablet Take 5 mg by mouth daily with breakfast.   Yes [provider]  HUMULIN 70/30 KWIKPEN (70-30) 100 UNIT/ML PEN Inject 20 Units into the skin daily.  10/18/14  Yes [provider]  hydrALAZINE (APRESOLINE) 25 MG tablet Take 1 tablet (25 mg total) by mouth 2 (two) times daily. 12/06/15  Yes Robyn Haber, MD  omeprazole (PRILOSEC) 40 MG capsule Take 40 mg by mouth daily.   Yes [provider]    Physical Exam: Vitals:   11/27/16 1930 11/27/16 1945 11/27/16 2015 11/27/16 2045  BP: (!) 108/52 (!) 137/54 111/64 112/73  Pulse: (!) 36 (!) 34 (!) 35 (!) 37  Resp: 15 16 15 15   Temp:      TempSrc:      SpO2: 99% 99% 100% 97%  Weight:      Height:        Constitutional: NAD, calm, comfortable Eyes: PERRL, lids and conjunctivae normal ENMT: Mucous membranes are moist. Posterior pharynx clear of any exudate or lesions.Normal dentition.  Neck: normal, supple, no masses, no thyromegaly Respiratory: clear to auscultation bilaterally, no wheezing, no crackles. Normal respiratory effort. No accessory muscle use.  Cardiovascular: Regular rate and rhythm, no murmurs / rubs / gallops. No extremity edema. 2+ pedal pulses. No carotid bruits.  Abdomen: no tenderness, no masses palpated. No hepatosplenomegaly. Bowel sounds positive.  Musculoskeletal: no clubbing / cyanosis. No joint deformity upper and lower extremities. Good ROM, no contractures. Normal muscle tone.  Skin: no rashes, lesions, ulcers. No induration Neurologic: CN 2-12 grossly intact. Sensation intact, DTR normal. Strength 5/5 in all 4.  Psychiatric: Normal judgment and insight. Alert and oriented x 3. Normal mood.    Labs on Admission: I have personally reviewed following labs and imaging studies  CBC:  Recent Labs Lab 11/27/16 1834  WBC 3.8*  HGB 10.4*  HCT 31.7*  MCV 90.8  PLT 308   Basic Metabolic Panel:  Recent Labs Lab 11/27/16 1834  NA 132*  K 5.2*  CL 104  CO2 22  GLUCOSE 119*  BUN 33*  CREATININE 2.49*  CALCIUM 8.1*   GFR: Estimated Creatinine Clearance: 24.2 mL/min (A) (by C-G formula based on SCr of 2.49 mg/dL (H)). Liver Function Tests: No results for input(s):  AST, ALT, ALKPHOS, BILITOT, PROT, ALBUMIN in the last 168 hours. No results for input(s): LIPASE, AMYLASE in the last 168 hours. No results for input(s): AMMONIA in the last 168 hours. Coagulation Profile: No results for input(s): INR, PROTIME in the last 168 hours. Cardiac Enzymes: No results for input(s): CKTOTAL, CKMB, CKMBINDEX, TROPONINI in the last 168 hours. BNP (last 3 results) No results for input(s): PROBNP in the last 8760 hours. HbA1C: No results for input(s): HGBA1C in the last 72 hours. CBG: No results for input(s): GLUCAP in the last 168 hours. Lipid Profile: No results for input(s): CHOL, HDL, LDLCALC, TRIG, CHOLHDL, LDLDIRECT in the last 72 hours. Thyroid Function Tests: No results for input(s): TSH, T4TOTAL,  FREET4, T3FREE, THYROIDAB in the last 72 hours. Anemia Panel: No results for input(s): VITAMINB12, FOLATE, FERRITIN, TIBC, IRON, RETICCTPCT in the last 72 hours. Urine analysis:    Component Value Date/Time   COLORURINE YELLOW 06/30/2015 [place] 06/30/2015 0939   LABSPEC 1.015 06/30/2015 0939   PHURINE 5.5 06/30/2015 0939   GLUCOSEU NEGATIVE 06/30/2015 0939   HGBUR NEGATIVE 06/30/2015 0939   BILIRUBINUR NEGATIVE 06/30/2015 0939   KETONESUR NEGATIVE 06/30/2015 0939   PROTEINUR 100 (A) 06/30/2015 0939   UROBILINOGEN 0.2 11/14/2014 1946   NITRITE NEGATIVE 06/30/2015 0939   LEUKOCYTESUR NEGATIVE 06/30/2015 0939    Radiological Exams on Admission: Dg Chest Port 1 View  Result Date: 11/27/2016 CLINICAL DATA:  Pt arrived via via [facility] from home where pt reports having sudden onset of SOB while at home with no respiratory hx. Pt also complains of weakness. Hx of diabetes and CHF. EXAM: PORTABLE CHEST 1 VIEW COMPARISON:  06/30/2015 FINDINGS: Cardiac silhouette mildly enlarged. No mediastinal hilar masses. No convincing adenopathy. Clear lungs.  No pleural effusion or pneumothorax. Skeletal structures are grossly intact. IMPRESSION: No active  disease. Electronically Signed   By: Lajean Manes M.D.   On: 11/27/2016 19:34    EKG: Independently reviewed.  Assessment/Plan Principal Problem:   Symptomatic bradycardia Active Problems:   CKD (chronic kidney disease), stage IV (HCC)   Essential hypertension   Type II diabetes mellitus (HCC)   Bradycardia with 31-40 beats per minute    1. Symptomatic bradycardia - now 3rd admission in 3 years for bradycardia due to metoprolol 1. Stop metoprolol 2. Adding metoprolol to allergies list 3. Tele monitor 2. CKD stage 4 - chronic and stable 3. DM2 - mod scale SSI AC for now 4. HTN - continue home meds other than metoprolol (hydralazine and norvasc)  DVT prophylaxis: Lovenox Code Status: Full Family Communication: No family in room Disposition Plan: Home after admit Consults called: EDP spoke with cards Admission status: Admit to inpatient   [place], [place] [facility] Pager [phone]  If 7AM-7PM, please contact day team taking care of patient [email or web address] Password [email or web address]  11/27/2016, 9:08 PM

## 2016-11-27 NOTE — ED Triage Notes (Signed)
Pt arrived via via [facility] from home where pt reports having sudden onset of SOB while at home with no respiratory hx. Per EMS pt HR 30-40s pt reports taking 2 new BP medications. Denies CP, denies dizziness.

## 2016-11-28 DIAGNOSIS — T447X5A Adverse effect of beta-adrenoreceptor antagonists, initial encounter: Secondary | ICD-10-CM

## 2016-11-28 DIAGNOSIS — E1100 Type 2 diabetes mellitus with hyperosmolarity without nonketotic hyperglycemic-hyperosmolar coma (NKHHC): Secondary | ICD-10-CM

## 2016-11-28 LAB — GLUCOSE, CAPILLARY
GLUCOSE-CAPILLARY: 82 mg/dL (ref 65–99)
GLUCOSE-CAPILLARY: 95 mg/dL (ref 65–99)
Glucose-Capillary: 209 mg/dL — ABNORMAL HIGH (ref 65–99)

## 2016-11-28 LAB — BASIC METABOLIC PANEL
ANION GAP: 10 (ref 5–15)
BUN: 37 mg/dL — ABNORMAL HIGH (ref 6–20)
CALCIUM: 8.5 mg/dL — AB (ref 8.9–10.3)
CO2: 17 mmol/L — ABNORMAL LOW (ref 22–32)
CREATININE: 2.81 mg/dL — AB (ref 0.44–1.00)
Chloride: 109 mmol/L (ref 101–111)
GFR, EST AFRICAN AMERICAN: 19 mL/min — AB (ref >60)
GFR, EST NON AFRICAN AMERICAN: 16 mL/min — AB (ref >60)
Glucose, Bld: 125 mg/dL — ABNORMAL HIGH (ref 65–99)
Potassium: 5.5 mmol/L — ABNORMAL HIGH (ref 3.5–5.1)
SODIUM: 136 mmol/L (ref 135–145)

## 2016-11-28 LAB — CBC
HEMATOCRIT: 32.1 % — AB (ref 36.0–46.0)
Hemoglobin: 10.8 g/dL — ABNORMAL LOW (ref 12.0–15.0)
MCH: 30.4 pg (ref 26.0–34.0)
MCHC: 33.6 g/dL (ref 30.0–36.0)
MCV: 90.4 fL (ref 78.0–100.0)
PLATELETS: 208 10*3/uL (ref 150–400)
RBC: 3.55 MIL/uL — AB (ref 3.87–5.11)
RDW: 12 % (ref 11.5–15.5)
WBC: 5.3 10*3/uL (ref 4.0–10.5)

## 2016-11-28 LAB — MRSA PCR SCREENING: MRSA BY PCR: NEGATIVE

## 2016-11-28 MED ORDER — FUROSEMIDE 10 MG/ML IJ SOLN
60.0000 mg | Freq: Once | INTRAMUSCULAR | Status: AC
  Administered 2016-11-28: 60 mg via INTRAVENOUS
  Filled 2016-11-28: qty 6

## 2016-11-28 MED ORDER — SODIUM CHLORIDE 0.9 % IV SOLN
INTRAVENOUS | Status: DC
  Administered 2016-11-28 – 2016-11-29 (×2): via INTRAVENOUS

## 2016-11-28 MED ORDER — CALCIUM GLUCONATE 10 % IV SOLN
1.0000 g | Freq: Once | INTRAVENOUS | Status: AC
  Administered 2016-11-28: 1 g via INTRAVENOUS
  Filled 2016-11-28: qty 10

## 2016-11-28 NOTE — Plan of Care (Signed)
  Education: Knowledge of [facility] Education information/materials will improve 11/28/2016 0532 - Progressing by Mellissa Kohut, RN   Safety: Ability to remain free from injury will improve. Fall risk bundle in place. Call light in reach and pt demonstrated use. Bed alarm on and pt asked to call for assistance before getting up. Will continue to monitor and assess.  Tissue Perfusion: Risk factors for ineffective tissue perfusion will decrease. Pt remains SB however she is asymptomatic.  11/28/2016 0532 - Not Progressing by Mellissa Kohut, RN

## 2016-11-28 NOTE — Plan of Care (Signed)
  Education: Knowledge of [facility] Education information/materials will improve 11/28/2016 1840 - Progressing by Maximino Sarin, RN Safety: Ability to remain free from injury will improve 11/28/2016 1840 - Progressing by Maximino Sarin, RN   Pain Managment: General experience of comfort will improve 11/28/2016 1840 - Progressing by Maximino Sarin, RN   Physical Regulation: Ability to maintain clinical measurements within normal limits will improve 11/28/2016 1840 - Progressing by Maximino Sarin, RN   Physical Regulation: Will remain free from infection 11/28/2016 1840 - Progressing by Maximino Sarin, RN   Skin Integrity: Risk for impaired skin integrity will decrease 11/28/2016 1840 - Progressing by Maximino Sarin, RN   Tissue Perfusion: Risk factors for ineffective tissue perfusion will decrease 11/28/2016 1840 - Progressing by Maximino Sarin, RN   Skin Integrity: Risk for impaired skin integrity will decrease 11/28/2016 1840 - Progressing by Maximino Sarin, RN   Activity: Risk for activity intolerance will decrease 11/28/2016 1840 - Progressing by Maximino Sarin, RN   Fluid Volume: Ability to maintain a balanced intake and output will improve 11/28/2016 1840 - Progressing by Maximino Sarin, RN   Nutrition: Adequate nutrition will be maintained 11/28/2016 1840 - Progressing by Maximino Sarin, RN   Bowel/Gastric: Will not experience complications related to bowel motility 11/28/2016 1840 - Progressing by Maximino Sarin, RN

## 2016-11-28 NOTE — Progress Notes (Signed)
PROGRESS NOTE  [name] [id number]:[id number] DOB: 1945/04/03 DOA: 11/27/2016 PCP: Benito Mccreedy, MD  HPI/Recap of past 24 hours: Pt continues to be bradycardic with HR in the low 40's; states the last time she took her metoprolol was yesterday. States she has not seen a cardiology in years and a nephrologist in more than a year. Denies chest pain or dyspnea. Hyperkalemia with peaked [place]. Calcium gluconate IV once, lasix 60 mg IV once and started gentle IV fluid.  Assessment/Plan: Principal Problem:   Symptomatic bradycardia Active Problems:   CKD (chronic kidney disease), stage IV (HCC)   Essential hypertension   Type II diabetes mellitus (HCC)   Bradycardia with 31-40 beats per minute   Code Status: Full  Family Communication: No family at bedside  Disposition Plan: Will stay another midnight due AKI and bradycardia   Consultants:  None  Procedures:  None  Antimicrobials:  None  DVT prophylaxis:  Lovenox 30 mg sq daily   Objective: Vitals:   11/28/16 0526 11/28/16 0730 11/28/16 0823 11/28/16 1040  BP: (!) 118/94 (!) 147/59  (!) 145/53  Pulse: (!) 33 (!) 36  (!) 42  Resp: 16 14  13   Temp: 98.5 F (36.9 C)  98.3 F (36.8 C)   TempSrc: Oral  Oral   SpO2: 96% 95%  98%  Weight: 112 kg (247 lb)     Height:        Intake/Output Summary (Last 24 hours) at 11/28/2016 1059 Last data filed at 11/27/2016 2209 Gross per 24 hour  Intake 500 ml  Output -  Net 500 ml   Filed Weights   11/27/16 1816 11/27/16 2232 11/28/16 0526  Weight: 104.3 kg (230 lb) 111.9 kg (246 lb 12.8 oz) 112 kg (247 lb)    Exam:   General:  71 yo AAF obese, sitting up in the bed in NAD  Cardiovascular: RRR no murmurs rubs or gallops  Respiratory: CTA no wheezes or rhonchi  Abdomen: soft obese NT ND w NBS x4 quadrants  Musculoskeletal: moves all 4 limbs freely  Skin: Intact  Psychiatry: Mood appropriate for condition and setting.   Data Reviewed: CBC: Recent Labs    Lab 11/27/16 1834 11/28/16 0557  WBC 3.8* 5.3  HGB 10.4* 10.8*  HCT 31.7* 32.1*  MCV 90.8 90.4  PLT 235 834   Basic Metabolic Panel: Recent Labs  Lab 11/27/16 1834 11/27/16 2251 11/28/16 0309  NA 132*  --  136  K 5.2* 5.4* 5.5*  CL 104  --  109  CO2 22  --  17*  GLUCOSE 119*  --  125*  BUN 33*  --  37*  CREATININE 2.49*  --  2.81*  CALCIUM 8.1*  --  8.5*   GFR: Estimated Creatinine Clearance: 22.3 mL/min (A) (by C-G formula based on SCr of 2.81 mg/dL (H)). Liver Function Tests: No results for input(s): AST, ALT, ALKPHOS, BILITOT, PROT, ALBUMIN in the last 168 hours. No results for input(s): LIPASE, AMYLASE in the last 168 hours. No results for input(s): AMMONIA in the last 168 hours. Coagulation Profile: No results for input(s): INR, PROTIME in the last 168 hours. Cardiac Enzymes: No results for input(s): CKTOTAL, CKMB, CKMBINDEX, TROPONINI in the last 168 hours. BNP (last 3 results) No results for input(s): PROBNP in the last 8760 hours. HbA1C: No results for input(s): HGBA1C in the last 72 hours. CBG: Recent Labs  Lab 11/27/16 2243 11/28/16 0730  GLUCAP 141* 82   Lipid Profile: No results for  input(s): CHOL, HDL, LDLCALC, TRIG, CHOLHDL, LDLDIRECT in the last 72 hours. Thyroid Function Tests: No results for input(s): TSH, T4TOTAL, FREET4, T3FREE, THYROIDAB in the last 72 hours. Anemia Panel: No results for input(s): VITAMINB12, FOLATE, FERRITIN, TIBC, IRON, RETICCTPCT in the last 72 hours. Urine analysis:    Component Value Date/Time   COLORURINE YELLOW 06/30/2015 0939   APPEARANCEUR CLEAR 06/30/2015 0939   LABSPEC 1.015 06/30/2015 0939   PHURINE 5.5 06/30/2015 0939   GLUCOSEU NEGATIVE 06/30/2015 0939   HGBUR NEGATIVE 06/30/2015 0939   BILIRUBINUR NEGATIVE 06/30/2015 0939   KETONESUR NEGATIVE 06/30/2015 0939   PROTEINUR 100 (A) 06/30/2015 0939   UROBILINOGEN 0.2 11/14/2014 1946   NITRITE NEGATIVE 06/30/2015 0939   LEUKOCYTESUR NEGATIVE 06/30/2015  0939   Sepsis Labs: @LABRCNTIP (procalcitonin:4,lacticidven:4)  ) Recent Results (from the past 240 hour(s))  MRSA PCR Screening     Status: None   Collection Time: 11/27/16 10:39 PM  Result Value Ref Range Status   MRSA by PCR NEGATIVE NEGATIVE Final    Comment:        The GeneXpert MRSA Assay (FDA approved for NASAL specimens only), is one component of a comprehensive MRSA colonization surveillance program. It is not intended to diagnose MRSA infection nor to guide or monitor treatment for MRSA infections.       Studies: Dg Chest Port 1 View  Result Date: 11/27/2016 CLINICAL DATA:  Pt arrived via via [facility] from home where pt reports having sudden onset of SOB while at home with no respiratory hx. Pt also complains of weakness. Hx of diabetes and CHF. EXAM: PORTABLE CHEST 1 VIEW COMPARISON:  06/30/2015 FINDINGS: Cardiac silhouette mildly enlarged. No mediastinal hilar masses. No convincing adenopathy. Clear lungs.  No pleural effusion or pneumothorax. Skeletal structures are grossly intact. IMPRESSION: No active disease. Electronically Signed   By: Lajean Manes M.D.   On: 11/27/2016 19:34    Scheduled Meds: . amLODipine  10 mg Oral Daily  . aspirin  81 mg Oral Daily  . carbamazepine  400 mg Oral BID  . citalopram  20 mg Oral Daily  . enoxaparin (LOVENOX) injection  30 mg Subcutaneous Q24H  . ezetimibe-simvastatin  1 tablet Oral Daily  . furosemide  40 mg Oral Daily  . hydrALAZINE  25 mg Oral BID  . insulin aspart  0-15 Units Subcutaneous TID WC  . pantoprazole  80 mg Oral Daily    Continuous Infusions:   LOS: 1 day   Assessment and Plan: 1. Symptomatic bradycardia associated with generalized weakness       -suspect beta blocker toxicity, if persists consult cardiology -Stop metoprolol, last dose yesterday -BP is stable; continue telemetry -Repeat EKG as needed  2. AKI on CKD stage 4          -Baseline cr 2.22; cr 2.81         -avoid nephrotoxic agents           -IV fluid hydration          -BMP in am  3. DM2 - moderate scale SSI AC with hypoglycemia protocol       -A1C  4. HTN        - stop metoprolol       - continue hydralazine and norvasc  5.   Ambulatory dysfunction       -PT evaluate and treat   Kayleen Memos, MD [facility] Pager [phone]  If 7PM-7AM, please contact night-coverage [email or web address] Password [email or web address] 11/28/2016, 10:59 AM

## 2016-11-29 ENCOUNTER — Inpatient Hospital Stay (HOSPITAL_COMMUNITY): Payer: Medicare Other

## 2016-11-29 DIAGNOSIS — N185 Chronic kidney disease, stage 5: Secondary | ICD-10-CM

## 2016-11-29 DIAGNOSIS — R5383 Other fatigue: Secondary | ICD-10-CM

## 2016-11-29 DIAGNOSIS — N179 Acute kidney failure, unspecified: Secondary | ICD-10-CM

## 2016-11-29 LAB — CBC
HEMATOCRIT: 29.7 % — AB (ref 36.0–46.0)
Hemoglobin: 10 g/dL — ABNORMAL LOW (ref 12.0–15.0)
MCH: 30.3 pg (ref 26.0–34.0)
MCHC: 33.7 g/dL (ref 30.0–36.0)
MCV: 90 fL (ref 78.0–100.0)
Platelets: 195 10*3/uL (ref 150–400)
RBC: 3.3 MIL/uL — ABNORMAL LOW (ref 3.87–5.11)
RDW: 12.3 % (ref 11.5–15.5)
WBC: 5.9 10*3/uL (ref 4.0–10.5)

## 2016-11-29 LAB — BASIC METABOLIC PANEL
Anion gap: 7 (ref 5–15)
BUN: 51 mg/dL — AB (ref 6–20)
CALCIUM: 8.4 mg/dL — AB (ref 8.9–10.3)
CO2: 20 mmol/L — AB (ref 22–32)
Chloride: 107 mmol/L (ref 101–111)
Creatinine, Ser: 3.39 mg/dL — ABNORMAL HIGH (ref 0.44–1.00)
GFR calc Af Amer: 15 mL/min — ABNORMAL LOW (ref >60)
GFR, EST NON AFRICAN AMERICAN: 13 mL/min — AB (ref >60)
GLUCOSE: 138 mg/dL — AB (ref 65–99)
Potassium: 5.7 mmol/L — ABNORMAL HIGH (ref 3.5–5.1)
Sodium: 134 mmol/L — ABNORMAL LOW (ref 135–145)

## 2016-11-29 LAB — GLUCOSE, CAPILLARY
GLUCOSE-CAPILLARY: 129 mg/dL — AB (ref 65–99)
GLUCOSE-CAPILLARY: 152 mg/dL — AB (ref 65–99)
Glucose-Capillary: 137 mg/dL — ABNORMAL HIGH (ref 65–99)
Glucose-Capillary: 233 mg/dL — ABNORMAL HIGH (ref 65–99)

## 2016-11-29 MED ORDER — SODIUM POLYSTYRENE SULFONATE 15 GM/60ML PO SUSP
30.0000 g | Freq: Once | ORAL | Status: AC
  Administered 2016-11-29: 30 g via ORAL
  Filled 2016-11-29: qty 120

## 2016-11-29 NOTE — Plan of Care (Signed)
Bed in low position, non skid socks on, call light and belongings in reach, pt calls appropriately for assistance.

## 2016-11-29 NOTE — Progress Notes (Signed)
PROGRESS NOTE  [name] PJK:[id number] DOB: 1945-09-23 DOA: 11/27/2016 PCP: Benito Mccreedy, MD  HPI/Recap of past 24 hours: Pt seen and examined at her bedside. Reports fatigue. Continues to have bradycardia. Denies chest pain, dyspnea or dizziness.  Assessment/Plan: Principal Problem:   Symptomatic bradycardia Active Problems:   CKD (chronic kidney disease), stage IV (HCC)   Essential hypertension   Type II diabetes mellitus (HCC)   Bradycardia with 31-40 beats per minute   Code Status: Full  Family Communication: No family at bedside  Disposition Plan: Will stay another midnight due AKI and bradycardia   Consultants:  None  Procedures:  None  Antimicrobials:  None   DVT prophylaxis:  Lovenox 30 mg sq daily   Objective: Vitals:   11/28/16 2302 11/29/16 0020 11/29/16 0244 11/29/16 0700  BP: (!) 153/54 (!) 170/70  135/73  Pulse:  (!) 47 (!) 26 (!) 52  Resp:  15 15 15   Temp:  98.1 F (36.7 C)  97.7 F (36.5 C)  TempSrc:  Oral  Oral  SpO2: 98% 97% 93% 97%  Weight:  114.9 kg (253 lb 4.8 oz)    Height:        Intake/Output Summary (Last 24 hours) at 11/29/2016 8099 Last data filed at 11/29/2016 0100 Gross per 24 hour  Intake 1411.25 ml  Output 900 ml  Net 511.25 ml   Filed Weights   11/27/16 2232 11/28/16 0526 11/29/16 0020  Weight: 111.9 kg (246 lb 12.8 oz) 112 kg (247 lb) 114.9 kg (253 lb 4.8 oz)    Exam:   General:  71 yo AAF obese, sitting up in the bed in NAD  Cardiovascular: RRR no murmurs rubs or gallops  Respiratory: CTA no wheezes or rhonchi  Abdomen: soft obese NT ND w NBS x4 quadrants  Musculoskeletal: moves all 4 limbs freely  Skin: Intact  Psychiatry: Mood appropriate for condition and setting.  Data Reviewed: CBC: Recent Labs  Lab 11/27/16 1834 11/28/16 0557 11/29/16 0445  WBC 3.8* 5.3 5.9  HGB 10.4* 10.8* 10.0*  HCT 31.7* 32.1* 29.7*  MCV 90.8 90.4 90.0  PLT 235 208 833   Basic Metabolic  Panel: Recent Labs  Lab 11/27/16 1834 11/27/16 2251 11/28/16 0309 11/29/16 0445  NA 132*  --  136 134*  K 5.2* 5.4* 5.5* 5.7*  CL 104  --  109 107  CO2 22  --  17* 20*  GLUCOSE 119*  --  125* 138*  BUN 33*  --  37* 51*  CREATININE 2.49*  --  2.81* 3.39*  CALCIUM 8.1*  --  8.5* 8.4*   GFR: Estimated Creatinine Clearance: 18.8 mL/min (A) (by C-G formula based on SCr of 3.39 mg/dL (H)). Liver Function Tests: No results for input(s): AST, ALT, ALKPHOS, BILITOT, PROT, ALBUMIN in the last 168 hours. No results for input(s): LIPASE, AMYLASE in the last 168 hours. No results for input(s): AMMONIA in the last 168 hours. Coagulation Profile: No results for input(s): INR, PROTIME in the last 168 hours. Cardiac Enzymes: No results for input(s): CKTOTAL, CKMB, CKMBINDEX, TROPONINI in the last 168 hours. BNP (last 3 results) No results for input(s): PROBNP in the last 8760 hours. HbA1C: No results for input(s): HGBA1C in the last 72 hours. CBG: Recent Labs  Lab 11/27/16 2243 11/28/16 0730 11/28/16 1152 11/28/16 1642 11/29/16 0814  GLUCAP 141* 82 209* 95 129*   Lipid Profile: No results for input(s): CHOL, HDL, LDLCALC, TRIG, CHOLHDL, LDLDIRECT in the last 72 hours. Thyroid  Function Tests: No results for input(s): TSH, T4TOTAL, FREET4, T3FREE, THYROIDAB in the last 72 hours. Anemia Panel: No results for input(s): VITAMINB12, FOLATE, FERRITIN, TIBC, IRON, RETICCTPCT in the last 72 hours. Urine analysis:    Component Value Date/Time   COLORURINE YELLOW 06/30/2015 0939   APPEARANCEUR CLEAR 06/30/2015 0939   LABSPEC 1.015 06/30/2015 0939   PHURINE 5.5 06/30/2015 0939   GLUCOSEU NEGATIVE 06/30/2015 0939   HGBUR NEGATIVE 06/30/2015 0939   BILIRUBINUR NEGATIVE 06/30/2015 0939   KETONESUR NEGATIVE 06/30/2015 0939   PROTEINUR 100 (A) 06/30/2015 0939   UROBILINOGEN 0.2 11/14/2014 1946   NITRITE NEGATIVE 06/30/2015 0939   LEUKOCYTESUR NEGATIVE 06/30/2015 0939   Sepsis  Labs: @LABRCNTIP (procalcitonin:4,lacticidven:4)  ) Recent Results (from the past 240 hour(s))  MRSA PCR Screening     Status: None   Collection Time: 11/27/16 10:39 PM  Result Value Ref Range Status   MRSA by PCR NEGATIVE NEGATIVE Final    Comment:        The GeneXpert MRSA Assay (FDA approved for NASAL specimens only), is one component of a comprehensive MRSA colonization surveillance program. It is not intended to diagnose MRSA infection nor to guide or monitor treatment for MRSA infections.       Studies: No results found.  Scheduled Meds: . amLODipine  10 mg Oral Daily  . aspirin  81 mg Oral Daily  . carbamazepine  400 mg Oral BID  . citalopram  20 mg Oral Daily  . enoxaparin (LOVENOX) injection  30 mg Subcutaneous Q24H  . ezetimibe-simvastatin  1 tablet Oral Daily  . hydrALAZINE  25 mg Oral BID  . insulin aspart  0-15 Units Subcutaneous TID WC  . pantoprazole  80 mg Oral Daily  . sodium polystyrene  30 g Oral Once    Continuous Infusions: . sodium chloride 75 mL/hr at 11/28/16 1401     LOS: 2 days   Assessment and Plan:  1. Symptomatic bradycardia associated with generalized weakness       -suspect beta blocker toxicity.       -persistent       -consult cardiology -Stop metoprolol -BP is stable; continue telemetry -Repeat EKG as needed  2. AKI on CKD stage 4          -worsening renal function         -cr3.39 from 2.81         -Baseline cr 2.22; cr 2.81         -avoid nephrotoxic agents          -IV fluid hydration          -renal u/s done today 11/30/16 unremarkable          -continue to monitor U/O; over 850cc/day          -contact nephrology          -BMP in am  4.     Hyperkalemia most likely related to AKI and          advanced renal insufficiency          -Calcium carbonate IV once          -Kayexalate 30mg           -IV fluid           -BMP am  5.       DM2 - moderate scale SSI AC with      hypoglycemia protocol        -stable  4. HTN        -  stop metoprolol       - continue hydralazine and norvasc  5.   Ambulatory dysfunction       -PT evaluate and treat   Kayleen Memos, MD [facility] Pager [phone]  If 7PM-7AM, please contact night-coverage [email or web address] Password [email or web address] 11/29/2016, 9:28 AM

## 2016-11-29 NOTE — Plan of Care (Signed)
  Tissue Perfusion: Risk factors for ineffective tissue perfusion will decrease. Pt HR remains SB with HR in 30-40's. Pt also had a 3.7 sec pause and HR briefly went to 26. On call hospitalist paged and notified. Waiting for a call back.  11/29/2016 0259 - Not Progressing by Mellissa Kohut, RN

## 2016-11-30 LAB — CBC
HEMATOCRIT: 28.7 % — AB (ref 36.0–46.0)
Hemoglobin: 9.7 g/dL — ABNORMAL LOW (ref 12.0–15.0)
MCH: 30.1 pg (ref 26.0–34.0)
MCHC: 33.8 g/dL (ref 30.0–36.0)
MCV: 89.1 fL (ref 78.0–100.0)
Platelets: 167 10*3/uL (ref 150–400)
RBC: 3.22 MIL/uL — ABNORMAL LOW (ref 3.87–5.11)
RDW: 12.2 % (ref 11.5–15.5)
WBC: 5 10*3/uL (ref 4.0–10.5)

## 2016-11-30 LAB — BASIC METABOLIC PANEL
ANION GAP: 7 (ref 5–15)
BUN: 40 mg/dL — ABNORMAL HIGH (ref 6–20)
CALCIUM: 8.1 mg/dL — AB (ref 8.9–10.3)
CO2: 21 mmol/L — AB (ref 22–32)
Chloride: 113 mmol/L — ABNORMAL HIGH (ref 101–111)
Creatinine, Ser: 2.78 mg/dL — ABNORMAL HIGH (ref 0.44–1.00)
GFR, EST AFRICAN AMERICAN: 19 mL/min — AB (ref >60)
GFR, EST NON AFRICAN AMERICAN: 16 mL/min — AB (ref >60)
Glucose, Bld: 155 mg/dL — ABNORMAL HIGH (ref 65–99)
Potassium: 3.9 mmol/L (ref 3.5–5.1)
SODIUM: 141 mmol/L (ref 135–145)

## 2016-11-30 LAB — GLUCOSE, CAPILLARY
GLUCOSE-CAPILLARY: 151 mg/dL — AB (ref 65–99)
GLUCOSE-CAPILLARY: 206 mg/dL — AB (ref 65–99)
GLUCOSE-CAPILLARY: 169 mg/dL — AB (ref 65–99)

## 2016-11-30 LAB — TSH: TSH: 1.248 u[IU]/mL (ref 0.350–4.500)

## 2016-11-30 MED ORDER — SODIUM CHLORIDE 0.9 % IV BOLUS (SEPSIS)
500.0000 mL | Freq: Once | INTRAVENOUS | Status: AC
  Administered 2016-11-30: 500 mL via INTRAVENOUS

## 2016-11-30 MED ORDER — SENNOSIDES-DOCUSATE SODIUM 8.6-50 MG PO TABS
1.0000 | ORAL_TABLET | Freq: Two times a day (BID) | ORAL | Status: DC
  Filled 2016-11-30: qty 1

## 2016-11-30 MED ORDER — SODIUM POLYSTYRENE SULFONATE 15 GM/60ML PO SUSP
30.0000 g | Freq: Once | ORAL | Status: AC
  Administered 2016-11-30: 30 g via ORAL
  Filled 2016-11-30: qty 120

## 2016-11-30 NOTE — Discharge Summary (Addendum)
Discharge Summary  [name]:[id number] DOB: Jul 15, 1945  PCP: Benito Mccreedy, MD  Admit date: 11/27/2016 Discharge date: 11/30/2016  Time spent: 25 mnutes  Recommendations for Outpatient Follow-up:  1. Follow up with PCP within a week 2. Take your medications as prescribed 3. Follow up with a nephrologist to monitor renal function and chronic kidney disease. The patient will make the appointment.  Discharge Diagnoses:  Active Hospital Problems   Diagnosis Date Noted  . Symptomatic bradycardia 11/27/2016  . Bradycardia with 31-40 beats per minute   . Essential hypertension   . Type II diabetes mellitus ([place])   . CKD (chronic kidney disease), stage IV [facility])     Resolved Hospital Problems   Diagnosis Date Noted Date Resolved  . Diabetes mellitus ([place]) 09/09/2006 11/27/2016    Discharge Condition: stable  Diet recommendation: Cardiac healthy diet  Vitals:   11/30/16 0815 11/30/16 1100  BP: 120/76 (!) 155/50  Pulse:  70  Resp:  17  Temp:  98.2 F (36.8 C)  SpO2:  100%    History of present illness:  [name] is a 71 y.o. female with medical history significant of CKD stage 4, DM2, HTN, obesity.  Patient presents to the ED with c/o generalized weakness.  Symptoms onset earlier today.  No associated SOB, fever, chills, nausea, vomiting.  She associates these symptoms with a new medicine (metoprolol) that she began taking on Wed or Thursday.  Unfortunately a review of her old records shows that she was admitted in 11/2014 due to bradycardia secondary to metoprolol, and again 06/2015 for bradycardia when she accidentally took left over metoprolol she had at home that didn't get thrown out after the first admit.  During her admission, metoprolol was held due to suspected beta blocker toxicity with improvement of her heart rate. AKI with CKD stage 3. Patient was encouraged to increase her water intake. IV fluid bolus an IV fluid hydration improved her creatinine  and renal function. Patient was advised to make an appointment with a nephrologist after her discharge to follow up on her renal function and chronic kidney disease.  On the day of discharge, the patient was hemodynamically stable. Heart rate was back to normal range and AKI had resolved. Patient was able to ambulate with physical therapy with no recommendation for PT follow up.   Hospital Course:  Principal Problem:   Symptomatic bradycardia Active Problems:   CKD (chronic kidney disease), stage IV ([facility])   Essential hypertension   Type II diabetes mellitus ([facility])   Bradycardia with 31-40 beats per minute   Procedures:  None  Consultations:  None  Discharge Exam: BP (!) 155/50 (BP Location: Right Arm)   Pulse 70   Temp 98.2 F (36.8 C) (Oral)   Resp 17   Ht 5\' 2"  (1.575 m)   Wt 112.2 kg (247 lb 4.8 oz)   SpO2 100%   BMI 45.23 kg/m   General: 71 yo AAF obese in no acute distress Cardiovascular: RRR with no murmurs rubs or gallops Respiratory: CTA with no wheeze rales or rhonchi  Discharge Instructions You were cared for by a hospitalist during your hospital stay. If you have any questions about your discharge medications or the care you received while you were in the hospital after you are discharged, you can call the unit and asked to speak with the hospitalist on call if the hospitalist that took care of you is not available. Once you are discharged, your primary care physician will  handle any further medical issues. Please note that NO REFILLS for any discharge medications will be authorized once you are discharged, as it is imperative that you return to your primary care physician (or establish a relationship with a primary care physician if you do not have one) for your aftercare needs so that they can reassess your need for medications and monitor your lab values.   Allergies as of 11/30/2016      Reactions   Codeine Nausea And Vomiting   Metoprolol Other (See  Comments)   Severe bradycardia.  Now getting admitted for 3rd time in 3 years for bradycardia onset after being started on toprol.  Please dont put patient on this med anymore!      Medication List    TAKE these medications   amLODipine 10 MG tablet Commonly known as:  NORVASC Take 10 mg by mouth daily.   aspirin 81 MG chewable tablet Chew 1 tablet (81 mg total) by mouth daily.   bisacodyl 5 MG EC tablet Commonly known as:  DULCOLAX Take 2 tablets (10 mg total) by mouth every morning. What changed:    when to take this  reasons to take this   carbamazepine 200 MG tablet Commonly known as:  TEGRETOL Take 400 mg by mouth 2 (two) times daily.   citalopram 20 MG tablet Commonly known as:  CELEXA Take 20 mg by mouth daily.   ezetimibe-simvastatin 10-40 MG tablet Commonly known as:  VYTORIN Take 1 tablet by mouth daily.   furosemide 80 MG tablet Commonly known as:  LASIX Take 2 tablets (160 mg total) by mouth 2 (two) times daily. What changed:    how much to take  when to take this   glipiZIDE 5 MG 24 hr tablet Commonly known as:  GLUCOTROL XL Take 5 mg by mouth daily with breakfast.   HUMULIN 70/30 KWIKPEN (70-30) 100 UNIT/ML PEN Generic drug:  Insulin Isophane & Regular Human Inject 20 Units into the skin daily.   hydrALAZINE 25 MG tablet Commonly known as:  APRESOLINE Take 1 tablet (25 mg total) by mouth 2 (two) times daily.   omeprazole 40 MG capsule Commonly known as:  PRILOSEC Take 40 mg by mouth daily.   VITAMIN C PO Take 1 tablet by mouth daily.   Vitamin D-3 1000 units Caps Take 1,000 Units by mouth daily at 12 noon.      Allergies  Allergen Reactions  . Codeine Nausea And Vomiting  . Metoprolol Other (See Comments)    Severe bradycardia.  Now getting admitted for 3rd time in 3 years for bradycardia onset after being started on toprol.  Please dont put patient on this med anymore!   Follow-up Information    Benito Mccreedy, MD Follow  up.   Specialty:  Internal Medicine Contact information: [address] [place] [address] [phone]            The results of significant diagnostics from this hospitalization (including imaging, microbiology, ancillary and laboratory) are listed below for reference.    Significant Diagnostic Studies: Ct Head Wo Contrast  Result Date: 11/07/2016 CLINICAL DATA:  Neuro deficit for greater than 6 hours EXAM: CT HEAD WITHOUT CONTRAST TECHNIQUE: Contiguous axial images were obtained from the base of the skull through the vertex without intravenous contrast. COMPARISON:  09/24/2014 FINDINGS: Brain: Mild chronic white matter ischemic change and mild atrophic change is seen. The overall appearance is similar to that noted on the prior exam. No findings  to suggest acute hemorrhage, acute infarction or space-occupying mass lesion are noted. Vascular: No hyperdense vessel or unexpected calcification. Skull: Normal. Negative for fracture or focal lesion. Sinuses/Orbits: No acute finding. Other: None. IMPRESSION: Chronic changes without acute abnormality. Electronically Signed   By: Inez Catalina M.D.   On: 11/07/2016 18:13   US Renal  Result Date: 11/29/2016 CLINICAL DATA:  Acute renal insufficiency EXAM: RENAL / URINARY TRACT ULTRASOUND COMPLETE COMPARISON:  December 12, 2014 FINDINGS: Right Kidney: Length: 10.1 cm. Echogenicity and renal cortical thickness are within normal limits. No mass, perinephric fluid, or hydronephrosis visualized. No sonographically demonstrable calculus or ureterectasis. Left Kidney: Length: 9.7 cm. Echogenicity and renal cortical thickness are within normal limits. No mass, perinephric fluid, or hydronephrosis visualized. No sonographically demonstrable calculus or ureterectasis. Bladder: Appears normal for degree of bladder distention. IMPRESSION: Study within normal limits. Electronically Signed   By: Lowella Grip III M.D.   On: 11/29/2016 13:06    Dg Chest Port 1 View  Result Date: 11/27/2016 CLINICAL DATA:  Pt arrived via via [facility] from home where pt reports having sudden onset of SOB while at home with no respiratory hx. Pt also complains of weakness. Hx of diabetes and CHF. EXAM: PORTABLE CHEST 1 VIEW COMPARISON:  06/30/2015 FINDINGS: Cardiac silhouette mildly enlarged. No mediastinal hilar masses. No convincing adenopathy. Clear lungs.  No pleural effusion or pneumothorax. Skeletal structures are grossly intact. IMPRESSION: No active disease. Electronically Signed   By: Lajean Manes M.D.   On: 11/27/2016 19:34    Microbiology: Recent Results (from the past 240 hour(s))  MRSA PCR Screening     Status: None   Collection Time: 11/27/16 10:39 PM  Result Value Ref Range Status   MRSA by PCR NEGATIVE NEGATIVE Final    Comment:        The GeneXpert MRSA Assay (FDA approved for NASAL specimens only), is one component of a comprehensive MRSA colonization surveillance program. It is not intended to diagnose MRSA infection nor to guide or monitor treatment for MRSA infections.      Labs: Basic Metabolic Panel: Recent Labs  Lab 11/27/16 1834 11/27/16 2251 11/28/16 0309 11/29/16 0445 11/30/16 0642  NA 132*  --  136 134* 141  K 5.2* 5.4* 5.5* 5.7* 3.9  CL 104  --  109 107 113*  CO2 22  --  17* 20* 21*  GLUCOSE 119*  --  125* 138* 155*  BUN 33*  --  37* 51* 40*  CREATININE 2.49*  --  2.81* 3.39* 2.78*  CALCIUM 8.1*  --  8.5* 8.4* 8.1*   Liver Function Tests: No results for input(s): AST, ALT, ALKPHOS, BILITOT, PROT, ALBUMIN in the last 168 hours. No results for input(s): LIPASE, AMYLASE in the last 168 hours. No results for input(s): AMMONIA in the last 168 hours. CBC: Recent Labs  Lab 11/27/16 1834 11/28/16 0557 11/29/16 0445 11/30/16 0642  WBC 3.8* 5.3 5.9 5.0  HGB 10.4* 10.8* 10.0* 9.7*  HCT 31.7* 32.1* 29.7* 28.7*  MCV 90.8 90.4 90.0 89.1  PLT 235 208 195 167   Cardiac Enzymes: No results for  input(s): CKTOTAL, CKMB, CKMBINDEX, TROPONINI in the last 168 hours. BNP: BNP (last 3 results) No results for input(s): BNP in the last 8760 hours.  ProBNP (last 3 results) No results for input(s): PROBNP in the last 8760 hours.  CBG: Recent Labs  Lab 11/29/16 1157 11/29/16 1633 11/29/16 2122 11/30/16 0736 11/30/16 1137  GLUCAP 152* 137* 233* 151* 206*  Signed:  Kayleen Memos, MD [facility] 11/30/2016, 4:08 PM

## 2016-11-30 NOTE — Consult Note (Signed)
[facility] CM Primary Care Navigator  11/30/2016  LARRY KNIPP 07-31-1945 [id number]   Met with patient in theroom to identify possible discharge needs.  Patient reports having generalized weakness and slow heart rate with the new medicine (metoprolol) that she began taking couple of days ago which resulted to this admission.   Patient reports that herprimary care provider is Dr. Iona Beard [name] with [facility] which is not under [facility] network.  Patient agreed when encouraged to follow-up with her primary care providerafter hospital discharge or when she returns back home in order to help manage her health issues.   For additional questions please contact:  Edwena Felty A. Raeleigh Guinn, BSN, RN-BC [facility] PRIMARY CARE Navigator Cell: [phone]

## 2016-11-30 NOTE — Plan of Care (Signed)
  Acute Rehab PT Goals(only PT should resolve) Patient Will Transfer Sit To/From Stand 11/30/2016 1540 by Triana Coover, [place] Taken 11/30/2016 1540  Patient will transfer sit to/from stand with modified independence Pt Will Ambulate 11/30/2016 1541 by Wray Kearns A, PT Flowsheets Taken 11/30/2016 1541  Pt will Ambulate > 125 feet;with modified independence;with least restrictive assistive device Pt Will Go Up/Down Stairs 11/30/2016 1541 by Wray Kearns A, PT Flowsheets Taken 11/30/2016 1541  Pt will Go Up / Down Stairs 1-2 stairs;with supervision

## 2016-11-30 NOTE — Evaluation (Signed)
Physical Therapy Evaluation Patient Details Name: [name] MRN: [id number] DOB: 21-Dec-1945 Today's Date: 11/30/2016   History of Present Illness  Patient is a 71 y/o female who presents with weakness and SOB. Found to have symptomatic bradycardia and AKI. PMH includes HTN, CKD, CHF, DM, DVT, depression.  Clinical Impression  Patient presents with impaired cardiovascular endurance, decreased activity tolerance and dyspnea on exertion s/p above. Tolerated gait training with supervision for safety but required standing rest break due to 2/4 DOE and fatigue. Pt from home alone and is Mod I PTA but reports having difficulty with ADLs. Would benefit from a rollator to improve activity tolerance as pt requires frequent rest breaks and this limits her mobility. HR ranged from 60-90 bpm during session. Will follow acutely to maximize independence and mobility and improve endurance prior to return home. Recommend OT consult to assess ability to perform ADLs as pt reports difficulty. Pt requesting return of [facility] aide services.     Follow Up Recommendations No PT follow up;Supervision - Intermittent    Equipment Recommendations  Other (comment)(4 wheeled walker- rollator)    Recommendations for Other Services OT consult     Precautions / Restrictions Precautions Precautions: Fall Restrictions Weight Bearing Restrictions: No      Mobility  Bed Mobility Overal bed mobility: Needs Assistance Bed Mobility: Supine to Sit     Supine to sit: Min guard;HOB elevated     General bed mobility comments: Increased time to get to EOB but no physical assist. Use of rail.   Transfers Overall transfer level: Needs assistance Equipment used: Rolling walker (2 wheeled);None Transfers: Sit to/from [facility] Sit to Stand: Supervision Stand pivot transfers: Supervision       General transfer comment: Supervision for safety. Stood from [employer], from [facility] x1. SPT bed to/from [facility] with  supervision for lines.   Ambulation/Gait Ambulation/Gait assistance: Supervision Ambulation Distance (Feet): 150 Feet Assistive device: Rolling walker (2 wheeled) Gait Pattern/deviations: Step-through pattern;Decreased stride length;Wide base of support   Gait velocity interpretation: Below normal speed for age/gender General Gait Details: Slow, mostly steady gait with 2/4 DOE. 1 standing rest break.   Stairs            Wheelchair Mobility    Modified Rankin (Stroke Patients Only)       Balance Overall balance assessment: Needs assistance Sitting-balance support: Feet supported;No upper extremity supported Sitting balance-Leahy Scale: Good Sitting balance - Comments: Able to perform pericare without assist or difficulty.    Standing balance support: During functional activity Standing balance-Leahy Scale: Fair Standing balance comment: Able to perform some dynamic tasks without UE support but requires UE support for longer distances due to fatigue.                             Pertinent Vitals/Pain Pain Assessment: Faces Faces Pain Scale: Hurts a little bit Pain Location: left shoulder Pain Descriptors / Indicators: Aching;Sore Pain Intervention(s): Monitored during session;Repositioned    Home Living Family/patient expects to be discharged to:: Private residence Living Arrangements: Alone Available Help at Discharge: Family;Available PRN/intermittently Type of Home: Apartment Home Access: Stairs to enter   Entrance Stairs-Number of Steps: 1-2 Home Layout: One level Home Equipment: Walker - 2 wheels;Tub bench;Cane - single point      Prior Function Level of Independence: Needs assistance   Gait / Transfers Assistance Needed: Uses RW at baseline. Does not drive.   ADL's / [employer]  Needed: Performing ADLs with some difficulty. Daughter helps with grocery shopping.        Hand Dominance   Dominant Hand: Right    Extremity/Trunk  Assessment   Upper Extremity Assessment Upper Extremity Assessment: Defer to OT evaluation    Lower Extremity Assessment Lower Extremity Assessment: Overall WFL for tasks assessed       Communication   Communication: No difficulties  Cognition Arousal/Alertness: Awake/alert Behavior During Therapy: WFL for tasks assessed/performed Overall Cognitive Status: Within Functional Limits for tasks assessed                                 General Comments: Jokes appropriately.       General Comments General comments (skin integrity, edema, etc.): HR ranged from 60-90 bpm during mobility.     Exercises     Assessment/Plan    PT Assessment Patient needs continued PT services  PT Problem List Decreased mobility;Pain;Decreased balance;Cardiopulmonary status limiting activity;Decreased activity tolerance       PT Treatment Interventions DME instruction;Balance training;Patient/family education;Gait training;Therapeutic activities;Therapeutic exercise;Stair training    PT Goals (Current goals can be found in the Care Plan section)  Acute Rehab PT Goals Patient Stated Goal: to have more help at home PT Goal Formulation: With patient Time For Goal Achievement: 12/14/16 Potential to Achieve Goals: Good    Frequency Min 3X/week   Barriers to discharge Decreased caregiver support lives alone    Co-evaluation               AM-PAC PT "6 Clicks" Daily Activity  Outcome Measure Difficulty turning over in bed (including adjusting bedclothes, sheets and blankets)?: None Difficulty moving from lying on back to sitting on the side of the bed? : None Difficulty sitting down on and standing up from a chair with arms (e.g., wheelchair, bedside commode, etc,.)?: None Help needed moving to and from a bed to chair (including a wheelchair)?: None Help needed walking in hospital room?: A Little Help needed climbing 3-5 steps with a railing? : A Little 6 Click Score: 22     End of Session Equipment Utilized During Treatment: Gait belt Activity Tolerance: Patient limited by fatigue;Patient tolerated treatment well Patient left: in bed;with bed alarm set;with call bell/phone within reach Nurse Communication: Mobility status PT Visit Diagnosis: Difficulty in walking, not elsewhere classified (R26.2);Other (comment)(dyspnea)    Time: 2094-7096 PT Time Calculation (min) (ACUTE ONLY): 25 min   Charges:   PT Evaluation $PT Eval Low Complexity: 1 Low PT Treatments $Gait Training: 8-22 mins   PT G Codes:        Wray Kearns, PT, DPT [phone]    Marguarite Arbour A Mattheo Swindle 11/30/2016, 3:35 PM

## 2016-11-30 NOTE — Discharge Instructions (Addendum)
Acute Kidney Injury, Adult Acute kidney injury is a sudden worsening of kidney function. The kidneys are organs that have several jobs. They filter the blood to remove waste products and extra fluid. They also maintain a healthy balance of minerals and hormones in the body, which helps control blood pressure and keep bones strong. With this condition, your kidneys do not do their jobs as well as they should. This condition ranges from mild to severe. Over time it may develop into long-lasting (chronic) kidney disease. Early detection and treatment may prevent acute kidney injury from developing into a chronic condition. What are the causes? Common causes of this condition include:  A problem with blood flow to the kidneys. This may be caused by: ? Low blood pressure (hypotension) or shock. ? Blood loss. ? Heart and blood vessel (cardiovascular) disease. ? Severe burns. ? Liver disease.  Direct damage to the kidneys. This may be caused by: ? Certain medicines. ? A kidney infection. ? Poisoning. ? Being around or in contact with toxic substances. ? A surgical wound. ? A hard, direct hit to the kidney area.  A sudden blockage of urine flow. This may be caused by: ? Cancer. ? Kidney stones. ? An enlarged prostate in males.  What are the signs or symptoms? Symptoms of this condition may not be obvious until the condition becomes severe. Symptoms of this condition can include:  Tiredness (lethargy), or difficulty staying awake.  Nausea or vomiting.  Swelling (edema) of the face, legs, ankles, or feet.  Problems with urination, such as: ? Abdominal pain, or pain along the side of your stomach (flank). ? Decreased urine production. ? Decrease in the force of urine flow.  Muscle twitches and cramps, especially in the legs.  Confusion or trouble concentrating.  Loss of appetite.  Fever.  How is this diagnosed? This condition may be diagnosed with tests, including:  Blood  tests.  Urine tests.  Imaging tests.  A test in which a sample of tissue is removed from the kidneys to be examined under a microscope (kidney biopsy).  How is this treated? Treatment for this condition depends on the cause and how severe the condition is. In mild cases, treatment may not be needed. The kidneys may heal on their own. In more severe cases, treatment will involve:  Treating the cause of the kidney injury. This may involve changing any medicines you are taking or adjusting your dosage.  Fluids. You may need specialized IV fluids to balance your body's needs.  Having a catheter placed to drain urine and prevent blockages.  Preventing problems from occurring. This may mean avoiding certain medicines or procedures that can cause further injury to the kidneys.  In some cases treatment may also require:  A procedure to remove toxic wastes from the body (dialysis or continuous renal replacement therapy - CRRT).  Surgery. This may be done to repair a torn kidney, or to remove the blockage from the urinary system.  Follow these instructions at home: Medicines  Take over-the-counter and prescription medicines only as told by your health care provider.  Do not take any new medicines without your health care provider's approval. Many medicines can worsen your kidney damage.  Do not take any vitamin and mineral supplements without your health care provider's approval. Many nutritional supplements can worsen your kidney damage. Lifestyle  If your health care provider prescribed changes to your diet, follow them. You may need to decrease the amount of protein you eat.  Achieve  and maintain a healthy weight. If you need help with this, ask your health care provider.  Start or continue an exercise plan. Try to exercise at least 30 minutes a day, 5 days a week.  Do not use any tobacco products, such as cigarettes, chewing tobacco, and e-cigarettes. If you need help quitting, ask  your health care provider. General instructions  Keep track of your blood pressure. Report changes in your blood pressure as told by your health care provider.  Stay up to date with immunizations. Ask your health care provider which immunizations you need.  Keep all follow-up visits as told by your health care provider. This is important. Where to find more information:  American Association of Kidney Patients: BombTimer.gl  National Kidney Foundation: www.kidney.[place]: https://mathis.com/  Life Options Rehabilitation Program: ? www.lifeoptions.org ? www.kidneyschool.org Contact a health care provider if:  Your symptoms get worse.  You develop new symptoms. Get help right away if:  You develop symptoms of worsening kidney disease, which include: ? Headaches. ? Abnormally dark or light skin. ? Easy bruising. ? Frequent hiccups. ? Chest pain. ? Shortness of breath. ? End of menstruation in women. ? Seizures. ? Confusion or altered mental status. ? Abdominal or back pain. ? Itchiness.  You have a fever.  Your body is producing less urine.  You have pain or bleeding when you urinate. Summary  Acute kidney injury is a sudden worsening of kidney function.  Acute kidney injury can be caused by problems with blood flow to the kidneys, direct damage to the kidneys, and sudden blockage of urine flow.  Symptoms of this condition may not be obvious until it becomes severe. Symptoms may include edema, lethargy, confusion, nausea or vomiting, and problems passing urine.  This condition can usually be diagnosed with blood tests, urine tests, and imaging tests. Sometimes a kidney biopsy is done to diagnose this condition.  Treatment for this condition often involves treating the underlying cause. It is treated with fluids, medicines, dialysis, diet changes, or surgery. This information is not intended to replace advice given to you by your health care provider.  Make sure you discuss any questions you have with your health care provider. Document Released: 07/27/2010 Document Revised: 01/02/2016 Document Reviewed: 01/02/2016 Elsevier Interactive Patient Education  2017 Else[employer] Inc.  Acute Kidney Injury, Adult Acute kidney injury is a sudden worsening of kidney function. The kidneys are organs that have several jobs. They filter the blood to remove waste products and extra fluid. They also maintain a healthy balance of minerals and hormones in the body, which helps control blood pressure and keep bones strong. With this condition, your kidneys do not do their jobs as well as they should. This condition ranges from mild to severe. Over time it may develop into long-lasting (chronic) kidney disease. Early detection and treatment may prevent acute kidney injury from developing into a chronic condition. What are the causes? Common causes of this condition include:  A problem with blood flow to the kidneys. This may be caused by: ? Low blood pressure (hypotension) or shock. ? Blood loss. ? Heart and blood vessel (cardiovascular) disease. ? Severe burns. ? Liver disease.  Direct damage to the kidneys. This may be caused by: ? Certain medicines. ? A kidney infection. ? Poisoning. ? Being around or in contact with toxic substances. ? A surgical wound. ? A hard, direct hit to the kidney area.  A sudden blockage of urine flow. This may be caused  by: ? Cancer. ? Kidney stones. ? An enlarged prostate in males.  What are the signs or symptoms? Symptoms of this condition may not be obvious until the condition becomes severe. Symptoms of this condition can include:  Tiredness (lethargy), or difficulty staying awake.  Nausea or vomiting.  Swelling (edema) of the face, legs, ankles, or feet.  Problems with urination, such as: ? Abdominal pain, or pain along the side of your stomach (flank). ? Decreased urine production. ? Decrease in the force of  urine flow.  Muscle twitches and cramps, especially in the legs.  Confusion or trouble concentrating.  Loss of appetite.  Fever.  How is this diagnosed? This condition may be diagnosed with tests, including:  Blood tests.  Urine tests.  Imaging tests.  A test in which a sample of tissue is removed from the kidneys to be examined under a microscope (kidney biopsy).  How is this treated? Treatment for this condition depends on the cause and how severe the condition is. In mild cases, treatment may not be needed. The kidneys may heal on their own. In more severe cases, treatment will involve:  Treating the cause of the kidney injury. This may involve changing any medicines you are taking or adjusting your dosage.  Fluids. You may need specialized IV fluids to balance your body's needs.  Having a catheter placed to drain urine and prevent blockages.  Preventing problems from occurring. This may mean avoiding certain medicines or procedures that can cause further injury to the kidneys.  In some cases treatment may also require:  A procedure to remove toxic wastes from the body (dialysis or continuous renal replacement therapy - CRRT).  Surgery. This may be done to repair a torn kidney, or to remove the blockage from the urinary system.  Follow these instructions at home: Medicines  Take over-the-counter and prescription medicines only as told by your health care provider.  Do not take any new medicines without your health care provider's approval. Many medicines can worsen your kidney damage.  Do not take any vitamin and mineral supplements without your health care provider's approval. Many nutritional supplements can worsen your kidney damage. Lifestyle  If your health care provider prescribed changes to your diet, follow them. You may need to decrease the amount of protein you eat.  Achieve and maintain a healthy weight. If you need help with this, ask your health care  provider.  Start or continue an exercise plan. Try to exercise at least 30 minutes a day, 5 days a week.  Do not use any tobacco products, such as cigarettes, chewing tobacco, and e-cigarettes. If you need help quitting, ask your health care provider. General instructions  Keep track of your blood pressure. Report changes in your blood pressure as told by your health care provider.  Stay up to date with immunizations. Ask your health care provider which immunizations you need.  Keep all follow-up visits as told by your health care provider. This is important. Where to find more information:  American Association of Kidney Patients: BombTimer.gl  National Kidney Foundation: www.kidney.[place]: https://mathis.com/  Life Options Rehabilitation Program: ? www.lifeoptions.org ? www.kidneyschool.org Contact a health care provider if:  Your symptoms get worse.  You develop new symptoms. Get help right away if:  You develop symptoms of worsening kidney disease, which include: ? Headaches. ? Abnormally dark or light skin. ? Easy bruising. ? Frequent hiccups. ? Chest pain. ? Shortness of breath. ? End of menstruation in  women. ? Seizures. ? Confusion or altered mental status. ? Abdominal or back pain. ? Itchiness.  You have a fever.  Your body is producing less urine.  You have pain or bleeding when you urinate. Summary  Acute kidney injury is a sudden worsening of kidney function.  Acute kidney injury can be caused by problems with blood flow to the kidneys, direct damage to the kidneys, and sudden blockage of urine flow.  Symptoms of this condition may not be obvious until it becomes severe. Symptoms may include edema, lethargy, confusion, nausea or vomiting, and problems passing urine.  This condition can usually be diagnosed with blood tests, urine tests, and imaging tests. Sometimes a kidney biopsy is done to diagnose this condition.  Treatment for  this condition often involves treating the underlying cause. It is treated with fluids, medicines, dialysis, diet changes, or surgery. This information is not intended to replace advice given to you by your health care provider. Make sure you discuss any questions you have with your health care provider. Document Released: 07/27/2010 Document Revised: 01/02/2016 Document Reviewed: 01/02/2016 Elsevier Interactive Patient Education  2017 Elsevier Inc.  Acute Kidney Injury, Adult Acute kidney injury is a sudden worsening of kidney function. The kidneys are organs that have several jobs. They filter the blood to remove waste products and extra fluid. They also maintain a healthy balance of minerals and hormones in the body, which helps control blood pressure and keep bones strong. With this condition, your kidneys do not do their jobs as well as they should. This condition ranges from mild to severe. Over time it may develop into long-lasting (chronic) kidney disease. Early detection and treatment may prevent acute kidney injury from developing into a chronic condition. What are the causes? Common causes of this condition include:  A problem with blood flow to the kidneys. This may be caused by: ? Low blood pressure (hypotension) or shock. ? Blood loss. ? Heart and blood vessel (cardiovascular) disease. ? Severe burns. ? Liver disease.  Direct damage to the kidneys. This may be caused by: ? Certain medicines. ? A kidney infection. ? Poisoning. ? Being around or in contact with toxic substances. ? A surgical wound. ? A hard, direct hit to the kidney area.  A sudden blockage of urine flow. This may be caused by: ? Cancer. ? Kidney stones. ? An enlarged prostate in males.  What are the signs or symptoms? Symptoms of this condition may not be obvious until the condition becomes severe. Symptoms of this condition can include:  Tiredness (lethargy), or difficulty staying awake.  Nausea or  vomiting.  Swelling (edema) of the face, legs, ankles, or feet.  Problems with urination, such as: ? Abdominal pain, or pain along the side of your stomach (flank). ? Decreased urine production. ? Decrease in the force of urine flow.  Muscle twitches and cramps, especially in the legs.  Confusion or trouble concentrating.  Loss of appetite.  Fever.  How is this diagnosed? This condition may be diagnosed with tests, including:  Blood tests.  Urine tests.  Imaging tests.  A test in which a sample of tissue is removed from the kidneys to be examined under a microscope (kidney biopsy).  How is this treated? Treatment for this condition depends on the cause and how severe the condition is. In mild cases, treatment may not be needed. The kidneys may heal on their own. In more severe cases, treatment will involve:  Treating the cause of the kidney  injury. This may involve changing any medicines you are taking or adjusting your dosage.  Fluids. You may need specialized IV fluids to balance your body's needs.  Having a catheter placed to drain urine and prevent blockages.  Preventing problems from occurring. This may mean avoiding certain medicines or procedures that can cause further injury to the kidneys.  In some cases treatment may also require:  A procedure to remove toxic wastes from the body (dialysis or continuous renal replacement therapy - CRRT).  Surgery. This may be done to repair a torn kidney, or to remove the blockage from the urinary system.  Follow these instructions at home: Medicines  Take over-the-counter and prescription medicines only as told by your health care provider.  Do not take any new medicines without your health care provider's approval. Many medicines can worsen your kidney damage.  Do not take any vitamin and mineral supplements without your health care provider's approval. Many nutritional supplements can worsen your kidney  damage. Lifestyle  If your health care provider prescribed changes to your diet, follow them. You may need to decrease the amount of protein you eat.  Achieve and maintain a healthy weight. If you need help with this, ask your health care provider.  Start or continue an exercise plan. Try to exercise at least 30 minutes a day, 5 days a week.  Do not use any tobacco products, such as cigarettes, chewing tobacco, and e-cigarettes. If you need help quitting, ask your health care provider. General instructions  Keep track of your blood pressure. Report changes in your blood pressure as told by your health care provider.  Stay up to date with immunizations. Ask your health care provider which immunizations you need.  Keep all follow-up visits as told by your health care provider. This is important. Where to find more information:  American Association of Kidney Patients: BombTimer.gl  National Kidney Foundation: www.kidney.[place]: https://mathis.com/  Life Options Rehabilitation Program: ? www.lifeoptions.org ? www.kidneyschool.org Contact a health care provider if:  Your symptoms get worse.  You develop new symptoms. Get help right away if:  You develop symptoms of worsening kidney disease, which include: ? Headaches. ? Abnormally dark or light skin. ? Easy bruising. ? Frequent hiccups. ? Chest pain. ? Shortness of breath. ? End of menstruation in women. ? Seizures. ? Confusion or altered mental status. ? Abdominal or back pain. ? Itchiness.  You have a fever.  Your body is producing less urine.  You have pain or bleeding when you urinate. Summary  Acute kidney injury is a sudden worsening of kidney function.  Acute kidney injury can be caused by problems with blood flow to the kidneys, direct damage to the kidneys, and sudden blockage of urine flow.  Symptoms of this condition may not be obvious until it becomes severe. Symptoms may include edema,  lethargy, confusion, nausea or vomiting, and problems passing urine.  This condition can usually be diagnosed with blood tests, urine tests, and imaging tests. Sometimes a kidney biopsy is done to diagnose this condition.  Treatment for this condition often involves treating the underlying cause. It is treated with fluids, medicines, dialysis, diet changes, or surgery. This information is not intended to replace advice given to you by your health care provider. Make sure you discuss any questions you have with your health care provider. Document Released: 07/27/2010 Document Revised: 01/02/2016 Document Reviewed: 01/02/2016 Elsevier Interactive Patient Education  2017 Elsevier Inc.  Acute Kidney Injury, Adult Acute kidney  injury is a sudden worsening of kidney function. The kidneys are organs that have several jobs. They filter the blood to remove waste products and extra fluid. They also maintain a healthy balance of minerals and hormones in the body, which helps control blood pressure and keep bones strong. With this condition, your kidneys do not do their jobs as well as they should. This condition ranges from mild to severe. Over time it may develop into long-lasting (chronic) kidney disease. Early detection and treatment may prevent acute kidney injury from developing into a chronic condition. What are the causes? Common causes of this condition include:  A problem with blood flow to the kidneys. This may be caused by: ? Low blood pressure (hypotension) or shock. ? Blood loss. ? Heart and blood vessel (cardiovascular) disease. ? Severe burns. ? Liver disease.  Direct damage to the kidneys. This may be caused by: ? Certain medicines. ? A kidney infection. ? Poisoning. ? Being around or in contact with toxic substances. ? A surgical wound. ? A hard, direct hit to the kidney area.  A sudden blockage of urine flow. This may be caused by: ? Cancer. ? Kidney stones. ? An enlarged  prostate in males.  What are the signs or symptoms? Symptoms of this condition may not be obvious until the condition becomes severe. Symptoms of this condition can include:  Tiredness (lethargy), or difficulty staying awake.  Nausea or vomiting.  Swelling (edema) of the face, legs, ankles, or feet.  Problems with urination, such as: ? Abdominal pain, or pain along the side of your stomach (flank). ? Decreased urine production. ? Decrease in the force of urine flow.  Muscle twitches and cramps, especially in the legs.  Confusion or trouble concentrating.  Loss of appetite.  Fever.  How is this diagnosed? This condition may be diagnosed with tests, including:  Blood tests.  Urine tests.  Imaging tests.  A test in which a sample of tissue is removed from the kidneys to be examined under a microscope (kidney biopsy).  How is this treated? Treatment for this condition depends on the cause and how severe the condition is. In mild cases, treatment may not be needed. The kidneys may heal on their own. In more severe cases, treatment will involve:  Treating the cause of the kidney injury. This may involve changing any medicines you are taking or adjusting your dosage.  Fluids. You may need specialized IV fluids to balance your body's needs.  Having a catheter placed to drain urine and prevent blockages.  Preventing problems from occurring. This may mean avoiding certain medicines or procedures that can cause further injury to the kidneys.  In some cases treatment may also require:  A procedure to remove toxic wastes from the body (dialysis or continuous renal replacement therapy - CRRT).  Surgery. This may be done to repair a torn kidney, or to remove the blockage from the urinary system.  Follow these instructions at home: Medicines  Take over-the-counter and prescription medicines only as told by your health care provider.  Do not take any new medicines without your  health care provider's approval. Many medicines can worsen your kidney damage.  Do not take any vitamin and mineral supplements without your health care provider's approval. Many nutritional supplements can worsen your kidney damage. Lifestyle  If your health care provider prescribed changes to your diet, follow them. You may need to decrease the amount of protein you eat.  Achieve and maintain a healthy weight.  If you need help with this, ask your health care provider.  Start or continue an exercise plan. Try to exercise at least 30 minutes a day, 5 days a week.  Do not use any tobacco products, such as cigarettes, chewing tobacco, and e-cigarettes. If you need help quitting, ask your health care provider. General instructions  Keep track of your blood pressure. Report changes in your blood pressure as told by your health care provider.  Stay up to date with immunizations. Ask your health care provider which immunizations you need.  Keep all follow-up visits as told by your health care provider. This is important. Where to find more information:  American Association of Kidney Patients: BombTimer.gl  National Kidney Foundation: www.kidney.[place]: https://mathis.com/  Life Options Rehabilitation Program: ? www.lifeoptions.org ? www.kidneyschool.org Contact a health care provider if:  Your symptoms get worse.  You develop new symptoms. Get help right away if:  You develop symptoms of worsening kidney disease, which include: ? Headaches. ? Abnormally dark or light skin. ? Easy bruising. ? Frequent hiccups. ? Chest pain. ? Shortness of breath. ? End of menstruation in women. ? Seizures. ? Confusion or altered mental status. ? Abdominal or back pain. ? Itchiness.  You have a fever.  Your body is producing less urine.  You have pain or bleeding when you urinate. Summary  Acute kidney injury is a sudden worsening of kidney function.  Acute kidney  injury can be caused by problems with blood flow to the kidneys, direct damage to the kidneys, and sudden blockage of urine flow.  Symptoms of this condition may not be obvious until it becomes severe. Symptoms may include edema, lethargy, confusion, nausea or vomiting, and problems passing urine.  This condition can usually be diagnosed with blood tests, urine tests, and imaging tests. Sometimes a kidney biopsy is done to diagnose this condition.  Treatment for this condition often involves treating the underlying cause. It is treated with fluids, medicines, dialysis, diet changes, or surgery. This information is not intended to replace advice given to you by your health care provider. Make sure you discuss any questions you have with your health care provider. Document Released: 07/27/2010 Document Revised: 01/02/2016 Document Reviewed: 01/02/2016 Elsevier Interactive Patient Education  2017 Elsevier Inc. Bradycardia, Adult Bradycardia is a slower-than-normal heartbeat. A normal resting heart rate for an adult ranges from 60 to 100 beats per minute. With bradycardia, the resting heart rate is less than 60 beats per minute. Bradycardia can prevent enough oxygen from reaching certain areas of your body when you are active. It can be serious if it keeps enough oxygen from reaching your brain and other parts of your body. Bradycardia is not a problem for everyone. For some healthy adults, a slow resting heart rate is normal. What are the causes? This condition may be caused by:  A problem with the heart, including: ? A problem with the heart's electrical system, such as a heart block. ? A problem with the heart's natural pacemaker (sinus node). ? Heart disease. ? A heart attack. ? Heart damage. ? A heart infection. ? A heart condition that is present at birth (congenital heart defect).  Certain medicines that treat heart conditions.  Certain conditions, such as hypothyroidism and  obstructive sleep apnea.  Problems with the balance of chemicals and other substances, like potassium, in the blood.  What increases the risk? This condition is more likely to develop in adults who:  Are age 69 or older.  Have high blood pressure (hypertension), high cholesterol (hyperlipidemia), or diabetes.  Drink heavily, use tobacco or nicotine products, or use drugs.  Are stressed.  What are the signs or symptoms? Symptoms of this condition include:  Light-headedness.  Feeling faint or fainting.  Fatigue and weakness.  Shortness of breath.  Chest pain (angina).  Drowsiness.  Confusion.  Dizziness.  How is this diagnosed? This condition may be diagnosed based on:  Your symptoms.  Your medical history.  A physical exam.  During the exam, your health care provider will listen to your heartbeat and check your pulse. To confirm the diagnosis, your health care provider may order tests, such as:  Blood tests.  An electrocardiogram (ECG). This test records the heart's electrical activity. The test can show how fast your heart is beating and whether the heartbeat is steady.  A test in which you wear a portable device (event recorder or Holter monitor) to record your heart's electrical activity while you go about your day.  Anexercise test.  How is this treated? Treatment for this condition depends on the cause of the condition and how severe your symptoms are. Treatment may involve:  Treatment of the underlying condition.  Changing your medicines or how much medicine you take.  Having a small, battery-operated device called a pacemaker implanted under the skin. When bradycardia occurs, this device can be used to increase your heart rate and help your heart to beat in a regular rhythm.  Follow these instructions at home: Lifestyle   Manage any health conditions that contribute to bradycardia as told by your health care provider.  Follow a heart-healthy  diet. A nutrition specialist (dietitian) can help to educate you about healthy food options and changes.  Follow an exercise program that is approved by your health care provider.  Maintain a healthy weight.  Try to reduce or manage your stress, such as with yoga or meditation. If you need help reducing stress, ask your health care provider.  Do not use use any products that contain nicotine or tobacco, such as cigarettes and e-cigarettes. If you need help quitting, ask your health care provider.  Do not use illegal drugs.  Limit alcohol intake to no more than 1 drink per day for nonpregnant women and 2 drinks per day for men. One drink equals 12 oz of beer, 5 oz of wine, or 1 oz of hard liquor. General instructions  Take over-the-counter and prescription medicines only as told by your health care provider.  Keep all follow-up visits as directed by your health care provider. This is important. How is this prevented? In some cases, bradycardia may be prevented by:  Treating underlying medical problems.  Stopping behaviors or medicines that can trigger the condition.  Contact a health care provider if:  You feel light-headed or dizzy.  You almost faint.  You feel weak or are easily fatigued during physical activity.  You experience confusion or have memory problems. Get help right away if:  You faint.  You have an irregular heartbeat (palpitations).  You have chest pain.  You have trouble breathing. This information is not intended to replace advice given to you by your health care provider. Make sure you discuss any questions you have with your health care provider. Document Released: 10/03/2001 Document Revised: 09/09/2015 Document Reviewed: 07/03/2015 Elsevier Interactive Patient Education  2017 [employer].

## 2016-12-07 DIAGNOSIS — H538 Other visual disturbances: Secondary | ICD-10-CM | POA: Diagnosis not present

## 2016-12-07 DIAGNOSIS — E1159 Type 2 diabetes mellitus with other circulatory complications: Secondary | ICD-10-CM | POA: Diagnosis not present

## 2016-12-07 DIAGNOSIS — Z136 Encounter for screening for cardiovascular disorders: Secondary | ICD-10-CM | POA: Diagnosis not present

## 2016-12-07 DIAGNOSIS — Z01118 Encounter for examination of ears and hearing with other abnormal findings: Secondary | ICD-10-CM | POA: Diagnosis not present

## 2016-12-07 DIAGNOSIS — Z0000 Encounter for general adult medical examination without abnormal findings: Secondary | ICD-10-CM | POA: Diagnosis not present

## 2016-12-15 DIAGNOSIS — E1159 Type 2 diabetes mellitus with other circulatory complications: Secondary | ICD-10-CM | POA: Diagnosis not present

## 2016-12-15 DIAGNOSIS — I509 Heart failure, unspecified: Secondary | ICD-10-CM | POA: Diagnosis not present

## 2016-12-20 ENCOUNTER — Ambulatory Visit (INDEPENDENT_AMBULATORY_CARE_PROVIDER_SITE_OTHER): Payer: Medicare Other | Admitting: Podiatry

## 2016-12-20 ENCOUNTER — Encounter: Payer: Self-pay | Admitting: Podiatry

## 2016-12-20 VITALS — BP 102/64 | HR 53

## 2016-12-20 DIAGNOSIS — M79675 Pain in left toe(s): Secondary | ICD-10-CM

## 2016-12-20 DIAGNOSIS — B351 Tinea unguium: Secondary | ICD-10-CM | POA: Diagnosis not present

## 2016-12-20 DIAGNOSIS — E119 Type 2 diabetes mellitus without complications: Secondary | ICD-10-CM

## 2016-12-20 DIAGNOSIS — M79674 Pain in right toe(s): Secondary | ICD-10-CM

## 2016-12-20 NOTE — Progress Notes (Signed)
   Subjective:    Patient ID: [name], female    DOB: October 14, 1945, 71 y.o.   MRN: [id number]  HPI    Review of Systems  All other systems reviewed and are negative.      Objective:   Physical Exam        Assessment & Plan:

## 2016-12-20 NOTE — Progress Notes (Signed)
Patient ID: [name], female   DOB: 30-Sep-1945, 71 y.o.   MRN: [id number]   Subjective: This diabetic patient presents today complaining that her toenails are thick and elongated and are comfortable walking wearing shoes and request toenail debridement. Patient states the nails are gradually thickened over long period of time and she is unable to trim the toenails are self. Last visit for this similar service was on 05/05/2016  Patient denies history of foot ulceration, claudication or amputation  Objective: Orientated 3 DP pulses 2/4 bilaterally PT pulses 1/4 bilaterally Capillary reflex within normal limits bilaterally Sensation to 10 g monofilament wire intact 8/8 bilaterally Vibratory sensation reactive bilaterally Ankle reflexes reactive bilaterally No open skin lesions bilaterally  Dry scaling skin bilaterally The toenails 6-10 are elongated, hypertrophic, brittle, deformed and tender direct palpation 6-10 Varus rotation fifth toes bilaterally  no restriction ankle, subtalar, midtarsal joints bilaterally Manual motor testing dorsi flexion, plantar flexion 5/5 bilaterally  Assessment: Diabetic without foot complications Symptomatic mycotic toenails 6-10  Plan: Debridement of toenails 6-10 mechanically and electrically without any bleeding  Reappoint 3 months

## 2016-12-20 NOTE — Patient Instructions (Signed)

## 2016-12-21 DIAGNOSIS — Z131 Encounter for screening for diabetes mellitus: Secondary | ICD-10-CM | POA: Diagnosis not present

## 2016-12-21 DIAGNOSIS — R569 Unspecified convulsions: Secondary | ICD-10-CM | POA: Diagnosis not present

## 2016-12-21 DIAGNOSIS — I10 Essential (primary) hypertension: Secondary | ICD-10-CM | POA: Diagnosis not present

## 2016-12-21 DIAGNOSIS — Z0000 Encounter for general adult medical examination without abnormal findings: Secondary | ICD-10-CM | POA: Diagnosis not present

## 2016-12-21 DIAGNOSIS — Z23 Encounter for immunization: Secondary | ICD-10-CM | POA: Diagnosis not present

## 2017-03-09 DIAGNOSIS — I10 Essential (primary) hypertension: Secondary | ICD-10-CM | POA: Diagnosis not present

## 2017-03-09 DIAGNOSIS — E1159 Type 2 diabetes mellitus with other circulatory complications: Secondary | ICD-10-CM | POA: Diagnosis not present

## 2017-03-09 DIAGNOSIS — I509 Heart failure, unspecified: Secondary | ICD-10-CM | POA: Diagnosis not present

## 2017-03-14 DIAGNOSIS — E785 Hyperlipidemia, unspecified: Secondary | ICD-10-CM | POA: Diagnosis not present

## 2017-03-14 DIAGNOSIS — I10 Essential (primary) hypertension: Secondary | ICD-10-CM | POA: Diagnosis not present

## 2017-03-14 DIAGNOSIS — E1159 Type 2 diabetes mellitus with other circulatory complications: Secondary | ICD-10-CM | POA: Diagnosis not present

## 2017-03-14 DIAGNOSIS — K219 Gastro-esophageal reflux disease without esophagitis: Secondary | ICD-10-CM | POA: Diagnosis not present

## 2017-03-21 ENCOUNTER — Ambulatory Visit: Payer: Medicare Other | Admitting: Podiatry

## 2017-04-01 ENCOUNTER — Ambulatory Visit: Payer: Medicare Other | Admitting: Podiatry

## 2017-04-08 ENCOUNTER — Ambulatory Visit: Payer: Medicare Other | Admitting: Podiatry

## 2017-04-20 DIAGNOSIS — N189 Chronic kidney disease, unspecified: Secondary | ICD-10-CM | POA: Diagnosis not present

## 2017-04-20 DIAGNOSIS — I10 Essential (primary) hypertension: Secondary | ICD-10-CM | POA: Diagnosis not present

## 2017-04-26 ENCOUNTER — Encounter: Payer: Self-pay | Admitting: Podiatry

## 2017-04-26 ENCOUNTER — Ambulatory Visit (INDEPENDENT_AMBULATORY_CARE_PROVIDER_SITE_OTHER): Payer: Medicare Other | Admitting: Podiatry

## 2017-04-26 DIAGNOSIS — B351 Tinea unguium: Secondary | ICD-10-CM

## 2017-04-26 DIAGNOSIS — M79674 Pain in right toe(s): Secondary | ICD-10-CM | POA: Diagnosis not present

## 2017-04-26 DIAGNOSIS — M79675 Pain in left toe(s): Secondary | ICD-10-CM

## 2017-04-26 NOTE — Progress Notes (Signed)
Patient ID: [name], female   DOB: 01/23/1946, 72 y.o.   MRN: [id number] Complaint:  Visit Type: Patient returns to my office for continued preventative foot care services. Complaint: Patient states" my nails have grown long and thick and become painful to walk and wear shoes" Patient has been diagnosed with DM with no complications. He presents for preventative foot care services. No changes to ROS  Podiatric Exam: Vascular: dorsalis pedis and posterior tibial pulses are palpable bilateral. Capillary return is immediate. Temperature gradient is WNL. Skin turgor WNL  Sensorium: Normal Semmes Weinstein monofilament test. Normal tactile sensation bilaterally. Nail Exam: Pt has thick disfigured discolored nails with subungual debris noted bilateral entire nail hallux through fifth toenails Ulcer Exam: There is no evidence of ulcer or pre-ulcerative changes or infection. Orthopedic Exam: Muscle tone and strength are WNL. No limitations in general ROM. No crepitus or effusions noted. Foot type and digits show no abnormalities. Bony prominences are unremarkable. Skin: No Porokeratosis. No infection or ulcers. Xerotic skin plantar B/L  Diagnosis:  Tinea unguium, Pain in right toe, pain in left toes  Treatment & Plan Procedures and Treatment: Consent by patient was obtained for treatment procedures. The patient understood the discussion of treatment and procedures well. All questions were answered thoroughly reviewed. Debridement of mycotic and hypertrophic toenails, 1 through 5 bilateral and clearing of subungual debris. No ulceration, no infection noted.  Return Visit-Office Procedure: Patient instructed to return to the office for a follow up visit 3 months for continued evaluation and treatment.   Shadiamond Koska DPM 

## 2017-05-02 ENCOUNTER — Emergency Department (HOSPITAL_COMMUNITY): Payer: Medicare Other

## 2017-05-02 ENCOUNTER — Encounter (HOSPITAL_COMMUNITY): Payer: Self-pay | Admitting: Emergency Medicine

## 2017-05-02 ENCOUNTER — Inpatient Hospital Stay (HOSPITAL_COMMUNITY)
Admission: EM | Admit: 2017-05-02 | Discharge: 2017-05-06 | DRG: 291 | Disposition: A | Discharge status: Skilled Nursing Facility | Payer: Medicare Other | Attending: Internal Medicine | Admitting: Internal Medicine

## 2017-05-02 DIAGNOSIS — G4733 Obstructive sleep apnea (adult) (pediatric): Secondary | ICD-10-CM | POA: Diagnosis present

## 2017-05-02 DIAGNOSIS — I5032 Chronic diastolic (congestive) heart failure: Secondary | ICD-10-CM

## 2017-05-02 DIAGNOSIS — K219 Gastro-esophageal reflux disease without esophagitis: Secondary | ICD-10-CM | POA: Diagnosis not present

## 2017-05-02 DIAGNOSIS — E875 Hyperkalemia: Secondary | ICD-10-CM | POA: Diagnosis present

## 2017-05-02 DIAGNOSIS — Z888 Allergy status to other drugs, medicaments and biological substances status: Secondary | ICD-10-CM | POA: Diagnosis not present

## 2017-05-02 DIAGNOSIS — Z7722 Contact with and (suspected) exposure to environmental tobacco smoke (acute) (chronic): Secondary | ICD-10-CM | POA: Diagnosis present

## 2017-05-02 DIAGNOSIS — R569 Unspecified convulsions: Secondary | ICD-10-CM

## 2017-05-02 DIAGNOSIS — F329 Major depressive disorder, single episode, unspecified: Secondary | ICD-10-CM | POA: Diagnosis present

## 2017-05-02 DIAGNOSIS — J9600 Acute respiratory failure, unspecified whether with hypoxia or hypercapnia: Secondary | ICD-10-CM | POA: Diagnosis not present

## 2017-05-02 DIAGNOSIS — N184 Chronic kidney disease, stage 4 (severe): Secondary | ICD-10-CM | POA: Diagnosis present

## 2017-05-02 DIAGNOSIS — G40909 Epilepsy, unspecified, not intractable, without status epilepticus: Secondary | ICD-10-CM | POA: Diagnosis present

## 2017-05-02 DIAGNOSIS — I10 Essential (primary) hypertension: Secondary | ICD-10-CM | POA: Diagnosis not present

## 2017-05-02 DIAGNOSIS — I509 Heart failure, unspecified: Secondary | ICD-10-CM | POA: Diagnosis not present

## 2017-05-02 DIAGNOSIS — Z6841 Body Mass Index (BMI) 40.0 and over, adult: Secondary | ICD-10-CM | POA: Diagnosis not present

## 2017-05-02 DIAGNOSIS — Z794 Long term (current) use of insulin: Secondary | ICD-10-CM | POA: Diagnosis not present

## 2017-05-02 DIAGNOSIS — E785 Hyperlipidemia, unspecified: Secondary | ICD-10-CM | POA: Diagnosis not present

## 2017-05-02 DIAGNOSIS — I5031 Acute diastolic (congestive) heart failure: Secondary | ICD-10-CM | POA: Diagnosis not present

## 2017-05-02 DIAGNOSIS — E1122 Type 2 diabetes mellitus with diabetic chronic kidney disease: Secondary | ICD-10-CM | POA: Diagnosis not present

## 2017-05-02 DIAGNOSIS — E119 Type 2 diabetes mellitus without complications: Secondary | ICD-10-CM | POA: Diagnosis not present

## 2017-05-02 DIAGNOSIS — Z713 Dietary counseling and surveillance: Secondary | ICD-10-CM

## 2017-05-02 DIAGNOSIS — Z7982 Long term (current) use of aspirin: Secondary | ICD-10-CM | POA: Diagnosis not present

## 2017-05-02 DIAGNOSIS — D649 Anemia, unspecified: Secondary | ICD-10-CM | POA: Diagnosis not present

## 2017-05-02 DIAGNOSIS — J9601 Acute respiratory failure with hypoxia: Secondary | ICD-10-CM | POA: Diagnosis present

## 2017-05-02 DIAGNOSIS — I130 Hypertensive heart and chronic kidney disease with heart failure and stage 1 through stage 4 chronic kidney disease, or unspecified chronic kidney disease: Secondary | ICD-10-CM | POA: Diagnosis not present

## 2017-05-02 DIAGNOSIS — I440 Atrioventricular block, first degree: Secondary | ICD-10-CM | POA: Diagnosis present

## 2017-05-02 DIAGNOSIS — I4891 Unspecified atrial fibrillation: Secondary | ICD-10-CM | POA: Diagnosis not present

## 2017-05-02 DIAGNOSIS — I5033 Acute on chronic diastolic (congestive) heart failure: Secondary | ICD-10-CM | POA: Diagnosis not present

## 2017-05-02 DIAGNOSIS — Z79899 Other long term (current) drug therapy: Secondary | ICD-10-CM | POA: Diagnosis not present

## 2017-05-02 DIAGNOSIS — Z86718 Personal history of other venous thrombosis and embolism: Secondary | ICD-10-CM | POA: Diagnosis not present

## 2017-05-02 DIAGNOSIS — E1100 Type 2 diabetes mellitus with hyperosmolarity without nonketotic hyperglycemic-hyperosmolar coma (NKHHC): Secondary | ICD-10-CM

## 2017-05-02 DIAGNOSIS — I248 Other forms of acute ischemic heart disease: Secondary | ICD-10-CM | POA: Diagnosis not present

## 2017-05-02 DIAGNOSIS — R0602 Shortness of breath: Secondary | ICD-10-CM | POA: Diagnosis not present

## 2017-05-02 DIAGNOSIS — Z9119 Patient's noncompliance with other medical treatment and regimen: Secondary | ICD-10-CM

## 2017-05-02 DIAGNOSIS — Z8249 Family history of ischemic heart disease and other diseases of the circulatory system: Secondary | ICD-10-CM

## 2017-05-02 DIAGNOSIS — M6281 Muscle weakness (generalized): Secondary | ICD-10-CM | POA: Diagnosis not present

## 2017-05-02 DIAGNOSIS — Z9114 Patient's other noncompliance with medication regimen: Secondary | ICD-10-CM

## 2017-05-02 DIAGNOSIS — Z885 Allergy status to narcotic agent status: Secondary | ICD-10-CM

## 2017-05-02 DIAGNOSIS — J810 Acute pulmonary edema: Secondary | ICD-10-CM | POA: Diagnosis not present

## 2017-05-02 DIAGNOSIS — N390 Urinary tract infection, site not specified: Secondary | ICD-10-CM | POA: Diagnosis not present

## 2017-05-02 DIAGNOSIS — D638 Anemia in other chronic diseases classified elsewhere: Secondary | ICD-10-CM | POA: Diagnosis not present

## 2017-05-02 DIAGNOSIS — D631 Anemia in chronic kidney disease: Secondary | ICD-10-CM | POA: Diagnosis not present

## 2017-05-02 DIAGNOSIS — R05 Cough: Secondary | ICD-10-CM | POA: Diagnosis not present

## 2017-05-02 DIAGNOSIS — R0902 Hypoxemia: Secondary | ICD-10-CM | POA: Diagnosis not present

## 2017-05-02 DIAGNOSIS — R7989 Other specified abnormal findings of blood chemistry: Secondary | ICD-10-CM | POA: Diagnosis not present

## 2017-05-02 HISTORY — DX: Type 2 diabetes mellitus without complications: E11.9

## 2017-05-02 HISTORY — DX: Chronic kidney disease, stage 4 (severe): N18.4

## 2017-05-02 HISTORY — DX: Morbid (severe) obesity due to excess calories: E66.01

## 2017-05-02 HISTORY — DX: Atrioventricular block, first degree: I44.0

## 2017-05-02 HISTORY — DX: Hyperlipidemia, unspecified: E78.5

## 2017-05-02 LAB — BASIC METABOLIC PANEL
Anion gap: 6 (ref 5–15)
BUN: 44 mg/dL — ABNORMAL HIGH (ref 6–20)
CALCIUM: 8.6 mg/dL — AB (ref 8.9–10.3)
CO2: 20 mmol/L — AB (ref 22–32)
CREATININE: 2.88 mg/dL — AB (ref 0.44–1.00)
Chloride: 111 mmol/L (ref 101–111)
GFR calc non Af Amer: 16 mL/min — ABNORMAL LOW (ref >60)
GFR, EST AFRICAN AMERICAN: 18 mL/min — AB (ref >60)
GLUCOSE: 169 mg/dL — AB (ref 65–99)
Potassium: 5.8 mmol/L — ABNORMAL HIGH (ref 3.5–5.1)
Sodium: 137 mmol/L (ref 135–145)

## 2017-05-02 LAB — CBC WITH DIFFERENTIAL/PLATELET
Basophils Absolute: 0 10*3/uL (ref 0.0–0.1)
Basophils Relative: 0 %
EOS ABS: 0 10*3/uL (ref 0.0–0.7)
EOS PCT: 0 %
HCT: 30.1 % — ABNORMAL LOW (ref 36.0–46.0)
Hemoglobin: 9.9 g/dL — ABNORMAL LOW (ref 12.0–15.0)
LYMPHS ABS: 0.4 10*3/uL — AB (ref 0.7–4.0)
Lymphocytes Relative: 5 %
MCH: 29.7 pg (ref 26.0–34.0)
MCHC: 32.9 g/dL (ref 30.0–36.0)
MCV: 90.4 fL (ref 78.0–100.0)
MONO ABS: 0.5 10*3/uL (ref 0.1–1.0)
MONOS PCT: 6 %
Neutro Abs: 6.9 10*3/uL (ref 1.7–7.7)
Neutrophils Relative %: 89 %
PLATELETS: 178 10*3/uL (ref 150–400)
RBC: 3.33 MIL/uL — ABNORMAL LOW (ref 3.87–5.11)
RDW: 12.6 % (ref 11.5–15.5)
WBC: 7.8 10*3/uL (ref 4.0–10.5)

## 2017-05-02 LAB — COMPREHENSIVE METABOLIC PANEL
ALT: 13 U/L — AB (ref 14–54)
AST: 17 U/L (ref 15–41)
Albumin: 3.6 g/dL (ref 3.5–5.0)
Alkaline Phosphatase: 84 U/L (ref 38–126)
Anion gap: 12 (ref 5–15)
BUN: 43 mg/dL — AB (ref 6–20)
CALCIUM: 8.7 mg/dL — AB (ref 8.9–10.3)
CHLORIDE: 110 mmol/L (ref 101–111)
CO2: 17 mmol/L — AB (ref 22–32)
CREATININE: 2.89 mg/dL — AB (ref 0.44–1.00)
GFR calc Af Amer: 18 mL/min — ABNORMAL LOW (ref >60)
GFR calc non Af Amer: 15 mL/min — ABNORMAL LOW (ref >60)
Glucose, Bld: 165 mg/dL — ABNORMAL HIGH (ref 65–99)
Potassium: 5.9 mmol/L — ABNORMAL HIGH (ref 3.5–5.1)
SODIUM: 139 mmol/L (ref 135–145)
Total Bilirubin: 0.7 mg/dL (ref 0.3–1.2)
Total Protein: 6.9 g/dL (ref 6.5–8.1)

## 2017-05-02 LAB — TROPONIN I
TROPONIN I: 0.39 ng/mL — AB (ref <0.03)
Troponin I: 0.22 ng/mL (ref <0.03)
Troponin I: 0.43 ng/mL (ref <0.03)

## 2017-05-02 LAB — HEMOGLOBIN A1C
Hgb A1c MFr Bld: 5.5 % (ref 4.8–5.6)
Mean Plasma Glucose: 111.15 mg/dL

## 2017-05-02 LAB — GLUCOSE, CAPILLARY: Glucose-Capillary: 76 mg/dL (ref 65–99)

## 2017-05-02 LAB — BRAIN NATRIURETIC PEPTIDE: B NATRIURETIC PEPTIDE 5: 1633.3 pg/mL — AB (ref 0.0–100.0)

## 2017-05-02 MED ORDER — SODIUM CHLORIDE 0.9 % IV SOLN
1.0000 g | Freq: Once | INTRAVENOUS | Status: AC
  Administered 2017-05-02: 1 g via INTRAVENOUS
  Filled 2017-05-02: qty 10

## 2017-05-02 MED ORDER — FUROSEMIDE 10 MG/ML IJ SOLN
40.0000 mg | Freq: Once | INTRAMUSCULAR | Status: AC
  Administered 2017-05-02: 40 mg via INTRAVENOUS
  Filled 2017-05-02: qty 4

## 2017-05-02 MED ORDER — ASPIRIN 81 MG PO CHEW
81.0000 mg | CHEWABLE_TABLET | Freq: Every day | ORAL | Status: DC
  Administered 2017-05-02 – 2017-05-06 (×5): 81 mg via ORAL
  Filled 2017-05-02 (×5): qty 1

## 2017-05-02 MED ORDER — SODIUM CHLORIDE 0.9% FLUSH
3.0000 mL | Freq: Two times a day (BID) | INTRAVENOUS | Status: DC
  Administered 2017-05-02 – 2017-05-06 (×7): 3 mL via INTRAVENOUS

## 2017-05-02 MED ORDER — CARBAMAZEPINE 200 MG PO TABS
400.0000 mg | ORAL_TABLET | Freq: Two times a day (BID) | ORAL | Status: DC
  Administered 2017-05-02 – 2017-05-06 (×9): 400 mg via ORAL
  Filled 2017-05-02 (×11): qty 2

## 2017-05-02 MED ORDER — CITALOPRAM HYDROBROMIDE 20 MG PO TABS
20.0000 mg | ORAL_TABLET | Freq: Every day | ORAL | Status: DC
  Administered 2017-05-03 – 2017-05-06 (×4): 20 mg via ORAL
  Filled 2017-05-02 (×4): qty 1

## 2017-05-02 MED ORDER — EZETIMIBE-SIMVASTATIN 10-40 MG PO TABS
1.0000 | ORAL_TABLET | Freq: Every day | ORAL | Status: DC
  Administered 2017-05-02 – 2017-05-06 (×5): 1 via ORAL
  Filled 2017-05-02 (×5): qty 1

## 2017-05-02 MED ORDER — ONDANSETRON HCL 4 MG/2ML IJ SOLN: 4.0000 mg | Freq: Four times a day (QID) | INTRAMUSCULAR | Status: DC | PRN

## 2017-05-02 MED ORDER — GLIPIZIDE ER 5 MG PO TB24
5.0000 mg | ORAL_TABLET | Freq: Every day | ORAL | Status: DC
  Administered 2017-05-04: 5 mg via ORAL
  Filled 2017-05-02 (×2): qty 1

## 2017-05-02 MED ORDER — ACETAMINOPHEN 325 MG PO TABS: 650.0000 mg | ORAL_TABLET | ORAL | Status: DC | PRN

## 2017-05-02 MED ORDER — SODIUM CHLORIDE 0.9 % IV SOLN
250.0000 mL | INTRAVENOUS | Status: DC | PRN
  Administered 2017-05-02: 250 mL via INTRAVENOUS

## 2017-05-02 MED ORDER — INSULIN ASPART 100 UNIT/ML ~~LOC~~ SOLN
0.0000 [IU] | Freq: Every day | SUBCUTANEOUS | Status: DC
  Administered 2017-05-03: 2 [IU] via SUBCUTANEOUS

## 2017-05-02 MED ORDER — FUROSEMIDE 10 MG/ML IJ SOLN
40.0000 mg | Freq: Two times a day (BID) | INTRAMUSCULAR | Status: DC
  Administered 2017-05-02 – 2017-05-03 (×2): 40 mg via INTRAVENOUS
  Filled 2017-05-02 (×2): qty 4

## 2017-05-02 MED ORDER — INSULIN ASPART PROT & ASPART (70-30 MIX) 100 UNIT/ML ~~LOC~~ SUSP
10.0000 [IU] | Freq: Two times a day (BID) | SUBCUTANEOUS | Status: DC
  Administered 2017-05-02 – 2017-05-05 (×6): 10 [IU] via SUBCUTANEOUS
  Filled 2017-05-02 (×3): qty 10

## 2017-05-02 MED ORDER — HEPARIN SODIUM (PORCINE) 5000 UNIT/ML IJ SOLN
5000.0000 [IU] | Freq: Three times a day (TID) | INTRAMUSCULAR | Status: DC
  Administered 2017-05-02 – 2017-05-06 (×13): 5000 [IU] via SUBCUTANEOUS
  Filled 2017-05-02 (×13): qty 1

## 2017-05-02 MED ORDER — PANTOPRAZOLE SODIUM 40 MG PO TBEC
40.0000 mg | DELAYED_RELEASE_TABLET | Freq: Every day | ORAL | Status: DC
  Administered 2017-05-03 – 2017-05-06 (×4): 40 mg via ORAL
  Filled 2017-05-02 (×4): qty 1

## 2017-05-02 MED ORDER — SODIUM CHLORIDE 0.9% FLUSH: 3.0000 mL | INTRAVENOUS | Status: DC | PRN

## 2017-05-02 MED ORDER — HYDRALAZINE HCL 25 MG PO TABS
25.0000 mg | ORAL_TABLET | Freq: Three times a day (TID) | ORAL | Status: DC
  Administered 2017-05-02 – 2017-05-06 (×14): 25 mg via ORAL
  Filled 2017-05-02 (×14): qty 1

## 2017-05-02 MED ORDER — INSULIN ASPART 100 UNIT/ML ~~LOC~~ SOLN
0.0000 [IU] | Freq: Three times a day (TID) | SUBCUTANEOUS | Status: DC
  Administered 2017-05-04: 3 [IU] via SUBCUTANEOUS
  Administered 2017-05-04 – 2017-05-05 (×2): 2 [IU] via SUBCUTANEOUS
  Administered 2017-05-05: 1 [IU] via SUBCUTANEOUS
  Administered 2017-05-05: 2 [IU] via SUBCUTANEOUS
  Administered 2017-05-06 (×2): 3 [IU] via SUBCUTANEOUS

## 2017-05-02 NOTE — ED Provider Notes (Signed)
[facility] Provider Note   CSN: [id number] Arrival date & time: 05/02/17  1326     History   Chief Complaint Chief Complaint  Patient presents with  . Shortness of Breath    HPI [name] is a 72 y.o. female.  The history is provided by the patient. No language interpreter was used.  Shortness of Breath     [name] is a 72 y.o. female who presents to the Emergency Department complaining of sob. She reports sudden onset shortness of breath that began yesterday at rest. She reports significant dyspnea on rest and with exertion. She denies any chest pain. She does have some cough. She denies any fevers. She has abdominal soreness with coughing. No lower extremity swelling. She denies any medication changes or recent illnesses. No prior similar symptoms. Symptoms are moderate and constant in nature.  Past Medical History:  Diagnosis Date  . Anemia   . CHF (congestive heart failure) ([place])   . CKD (chronic kidney disease), stage IV ([place])   . Depression   . DVT (deep venous thrombosis) ([place])   . Essential hypertension   . GERD (gastroesophageal reflux disease)   . Hyperlipidemia   . Morbid obesity ([place])   . Seizures ([place])    "epilepsy; they think I had one a couple months ago" (12/11/2014)  . Type II diabetes mellitus [facility])     Patient Active Problem List   Diagnosis Date Noted  . Acute respiratory failure with hypoxia ([place]) 05/02/2017  . Acute diastolic heart failure ([place]) 05/02/2017  . Seizures disorder 05/02/2017  . HTN (hypertension) 05/02/2017  . Diabetes mellitus type 2, insulin dependent ([place]) 05/02/2017  . Morbid obesity with BMI of 45.0-49.9, adult ([place]) 05/02/2017  . Anemia 05/02/2017  . CKD (chronic kidney disease) stage 4, GFR 15-29 ml/min ([facility]) 05/02/2017  . Dyslipidemia 05/02/2017  . Acute hyperkalemia 05/02/2017  . 1st degree AV block 05/02/2017  . Symptomatic bradycardia 11/27/2016  . DVT (deep venous thrombosis)  ([place]) 06/30/2015  . Chronic diastolic (congestive) heart failure ([place]) 06/30/2015  . Bradycardia with 31-40 beats per minute   . Emesis   . Junctional bradycardia   . Arterial embolism and thrombosis of upper extremity ([place])   . Bradycardia 12/11/2014  . Vomiting 12/11/2014  . Acute renal failure ([place]) 12/11/2014  . CKD (chronic kidney disease), stage IV ([place])   . Essential hypertension   . Type II diabetes mellitus ([place])   . OSA (obstructive sleep apnea) 10/17/2012  . Elevated troponin 09/26/2012  . Acute encephalopathy 09/25/2012  . Tremor 09/25/2012  . Rhabdomyolysis 09/25/2012  . Morbid obesity ([place]) 09/16/2006  . DEPRESSION, CHRONIC 09/16/2006  . Seizure ([place]) 09/16/2006  . Hyperlipidemia 09/09/2006  . HYPERTENSION, BENIGN 09/09/2006  . CONSTIPATION, HX OF 09/09/2006    Past Surgical History:  Procedure Laterality Date  . THROMBECTOMY BRACHIAL ARTERY Left 12/15/2014   Procedure: THROMBECTOMY BRACHIAL ARTERY; VEIN PATCH ANGIOPLASTY LEFT BRACIAL ARTERY;  Surgeon: Angelia Mould, MD;  Location: [facility];  Service: Vascular;  Laterality: Left;  . TUBAL LIGATION       OB History   None      Home Medications    Prior to Admission medications   Medication Sig Start Date End Date Taking? Authorizing Provider  amLODipine (NORVASC) 10 MG tablet Take 10 mg by mouth daily.   Yes [provider]  Ascorbic Acid (VITAMIN C PO) Take 1 tablet by mouth daily.   Yes [provider]  aspirin 81 MG chewable tablet Chew 1 tablet (81 mg total) by mouth daily. 12/21/14  Yes Rai, Ripudeep K, MD  bisacodyl (DULCOLAX) 5 MG EC tablet Take 2 tablets (10 mg total) by mouth every morning. Patient taking differently: Take 10 mg by mouth daily as needed.  12/21/14  Yes Rai, Ripudeep K, MD  carbamazepine (TEGRETOL) 200 MG tablet Take 400 mg by mouth 2 (two) times daily.    Yes [provider]  Cholecalciferol (VITAMIN D-3) 1000 UNITS CAPS Take 1,000 Units by mouth  daily at 12 noon.    Yes [provider]  citalopram (CELEXA) 20 MG tablet Take 20 mg by mouth daily.   Yes [provider]  ezetimibe-simvastatin (VYTORIN) 10-40 MG per tablet Take 1 tablet by mouth daily.   Yes [provider]  furosemide (LASIX) 80 MG tablet Take 2 tablets (160 mg total) by mouth 2 (two) times daily. Patient taking differently: Take 40 mg by mouth daily.  12/21/14  Yes Rai, Ripudeep K, MD  glipiZIDE (GLUCOTROL XL) 5 MG 24 hr tablet Take 5 mg by mouth daily with breakfast.   Yes [provider]  HUMULIN 70/30 KWIKPEN (70-30) 100 UNIT/ML PEN Inject 10 Units into the skin 2 (two) times daily.  10/18/14  Yes [provider]  hydrALAZINE (APRESOLINE) 25 MG tablet Take 1 tablet (25 mg total) by mouth 2 (two) times daily. Patient taking differently: Take 25 mg by mouth 3 (three) times daily.  12/06/15  Yes Robyn Haber, MD  omeprazole (PRILOSEC) 40 MG capsule Take 40 mg by mouth daily.   Yes [provider]    Family History Family History  Problem Relation Age of Onset  . Hypertension Mother        died @ 25 - unknown cause.  [name] Hypertension Father        says she doesn't know anything about her father.  . Cancer Maternal Aunt   . Seizures Neg Hx     Social History Social History   Tobacco Use  . Smoking status: Never Smoker  . Smokeless tobacco: Never Used  . Tobacco comment: EXPOSED TO 2ND HAND SMOKE X 20+ YEARS.   Substance Use Topics  . Alcohol use: No  . Drug use: No     Allergies   Codeine and Metoprolol   Review of Systems Review of Systems  Respiratory: Positive for shortness of breath.   All other systems reviewed and are negative.    Physical Exam Updated Vital Signs BP (!) 170/76   Pulse 74   Temp 99.1 F (37.3 C) (Oral)   Resp (!) 24   Ht 5\' 2"  (1.575 m)   Wt 113.4 kg (250 lb)   SpO2 100%   BMI 45.73 kg/m   Physical Exam  Constitutional: She is oriented to person, place, and  time. She appears well-developed and well-nourished.  HENT:  Head: Normocephalic and atraumatic.  Cardiovascular: Normal rate and regular rhythm.  No murmur heard. Pulmonary/Chest: Effort normal. No respiratory distress.  Rhonchi diffusely  Abdominal: Soft. There is no tenderness. There is no rebound and no guarding.  Musculoskeletal: She exhibits no edema or tenderness.  Neurological: She is alert and oriented to person, place, and time.  Skin: Skin is warm and dry.  Psychiatric: She has a normal mood and affect. Her behavior is normal.  Nursing note and vitals reviewed.    ED Treatments / Results  Labs (all labs ordered are listed, but only  abnormal results are displayed) Labs Reviewed  COMPREHENSIVE METABOLIC PANEL - Abnormal; Notable for the following components:      Result Value   Potassium 5.9 (*)    CO2 17 (*)    Glucose, Bld 165 (*)    BUN 43 (*)    Creatinine, Ser 2.89 (*)    Calcium 8.7 (*)    ALT 13 (*)    GFR calc non Af Amer 15 (*)    GFR calc Af Amer 18 (*)    All other components within normal limits  CBC WITH DIFFERENTIAL/PLATELET - Abnormal; Notable for the following components:   RBC 3.33 (*)    Hemoglobin 9.9 (*)    HCT 30.1 (*)    Lymphs Abs 0.4 (*)    All other components within normal limits  BRAIN NATRIURETIC PEPTIDE - Abnormal; Notable for the following components:   B Natriuretic Peptide 1,633.3 (*)    All other components within normal limits  TROPONIN I - Abnormal; Notable for the following components:   Troponin I 0.22 (*)    All other components within normal limits  TROPONIN I - Abnormal; Notable for the following components:   Troponin I 0.39 (*)    All other components within normal limits  BASIC METABOLIC PANEL - Abnormal; Notable for the following components:   Potassium 5.8 (*)    CO2 20 (*)    Glucose, Bld 169 (*)    BUN 44 (*)    Creatinine, Ser 2.88 (*)    Calcium 8.6 (*)    GFR calc non Af Amer 16 (*)    GFR calc Af Amer  18 (*)    All other components within normal limits  HEMOGLOBIN A1C  GLUCOSE, CAPILLARY  BASIC METABOLIC PANEL  TROPONIN I  TROPONIN I  TROPONIN I    EKG EKG Interpretation  Date/Time:  Monday May 02 2017 13:34:34 EDT Ventricular Rate:  80 PR Interval:    QRS Duration: 110 QT Interval:  416 QTC Calculation: 480 R Axis:   16 Text Interpretation:  Sinus rhythm Prolonged PR interval Baseline wander in lead(s) V3 Confirmed by Quintella Reichert [id number]) on 05/02/2017 1:48:11 PM   Radiology Dg Chest 2 View  Result Date: 05/02/2017 CLINICAL DATA:  Severe shortness of breath since yesterday EXAM: CHEST - 2 VIEW COMPARISON:  12/24/2016 FINDINGS: Enlargement of cardiac silhouette with pulmonary vascular congestion. Atherosclerotic calcifications aorta. Question mild LEFT perihilar infiltrate. Remaining lungs clear. No pleural effusion or pneumothorax. Scattered degenerative changes and scoliosis of the thoracic spine. IMPRESSION: Enlargement of cardiac silhouette with pulmonary vascular congestion. Question mild LEFT perihilar infiltrate. Electronically Signed   By: Lavonia Dana M.D.   On: 05/02/2017 15:19    Procedures Procedures (including critical care time)  Medications Ordered in ED Medications  aspirin chewable tablet 81 mg (81 mg Oral Given 05/02/17 1843)  carbamazepine (TEGRETOL) tablet 400 mg (has no administration in time range)  citalopram (CELEXA) tablet 20 mg (has no administration in time range)  ezetimibe-simvastatin (VYTORIN) 10-40 MG per tablet 1 tablet (1 tablet Oral Given 05/02/17 1844)  glipiZIDE (GLUCOTROL XL) 24 hr tablet 5 mg (has no administration in time range)  insulin aspart protamine- aspart (NOVOLOG MIX 70/30) injection 10 Units (has no administration in time range)  pantoprazole (PROTONIX) EC tablet 40 mg (has no administration in time range)  sodium chloride flush (NS) 0.9 % injection 3 mL (has no administration in time range)  sodium chloride flush (NS) 0.9 %  injection 3  mL (has no administration in time range)  0.9 %  sodium chloride infusion (250 mLs Intravenous New Bag/Given 05/02/17 2208)  acetaminophen (TYLENOL) tablet 650 mg (has no administration in time range)  ondansetron (ZOFRAN) injection 4 mg (has no administration in time range)  heparin injection 5,000 Units (has no administration in time range)  furosemide (LASIX) injection 40 mg (has no administration in time range)  hydrALAZINE (APRESOLINE) tablet 25 mg (25 mg Oral Given 05/02/17 1717)  insulin aspart (novoLOG) injection 0-9 Units (has no administration in time range)  insulin aspart (novoLOG) injection 0-5 Units (has no administration in time range)  calcium gluconate 1 g in sodium chloride 0.9 % 100 mL IVPB (1 g Intravenous New Bag/Given 05/02/17 2210)  furosemide (LASIX) injection 40 mg (40 mg Intravenous Given 05/02/17 1556)  furosemide (LASIX) injection 40 mg (40 mg Intravenous Given 05/02/17 1709)     Initial Impression / Assessment and Plan / ED Course  I have reviewed the triage vital signs and the nursing notes.  Pertinent labs & imaging results that were available during my care of the patient were reviewed by me and considered in my medical decision making (see chart for details).     In here for evaluation of shortness of breath since yesterday. She is to With wheezes and rhonchi on examination. Chest x-ray concerning for volume overload. Treating with Lasix for pulmonary edema. BMP with slight worsening in her chronic renal insufficiency with hyperkalemia. EKG with no acute changes. Hospitalist consulted for admission for pulmonary edema with new oxygen requirement, hyperkalemia. Patient updated of findings of studies and recommendation for admission for further treatment and she is in agreement with treatment plan. Final Clinical Impressions(s) / ED Diagnoses   Final diagnoses:  None    ED Discharge Orders    None       Quintella Reichert, MD 05/02/17 2241

## 2017-05-02 NOTE — ED Notes (Signed)
Hospital NP at bedside  

## 2017-05-02 NOTE — H&P (Signed)
History and Physical    TANIELLE EMIGH TML:[id number] DOB: 04/30/1945 DOA: 05/02/2017  **Will admit patient based on the expectation that the patient will need hospitalization/ hospital care that crosses at least 2 midnights  PCP: Benito Mccreedy, MD   Attending physician: Ree Kida  Patient coming from/Resides with: Private residence/lives alone  Chief Complaint: Shortness of breath  HPI: [name] is a 72 y.o. female with medical history significant for diastolic heart failure, diabetes on insulin, seizure disorder, anemia, hypertension, stage IV chronic kidney disease, dyslipidemia, morbid obesity.  She presents to the ER with shortness of breath.  Noted to have room air saturations in the 80% range.  Patient admitted to missing Lasix dosages.  Chest x-ray revealed coronary vascular congestion.  Was hypertensive.  He was elevated at 1633 and troponin was elevated at 0.22.  Potassium was elevated at 5.9, BUN 43 creatinine 2.89 which is baseline renal function for this patient.  Patient has been given a total of 80 mg of IV Lasix in the ER.  ED Course:  Vital Signs: BP (!) 183/89   Pulse (!) 114   Temp 98.7 F (37.1 C) (Oral)   Resp (!) 24   Ht 5\' 2"  (1.575 m)   Wt 113.4 kg (250 lb)   SpO2 95%   BMI 45.73 kg/m  CXR: As above Lab data: Sodium 139, potassium 5.9, chloride 110, CO2 17, glucose 165, BUN 43, creatinine 2.89, LFTs not elevated, BNP 1633, troponin 0 0.22, white count 7800 with neutrophils 89% absolute neutrophils, hemoglobin 9.9, platelets 178,000 Medications and treatments: Lasix 80 mg IV x1, aspirin 81 mg x1  Review of Systems:  In addition to the HPI above,  No Fever-chills, myalgias or other constitutional symptoms No Headache, changes with Vision or hearing, new weakness, tingling, numbness in any extremity, dizziness, dysarthria or word finding difficulty, gait disturbance or imbalance, tremors or seizure activity No problems swallowing food or Liquids,  indigestion/reflux, choking or coughing while eating, abdominal pain with or after eating No Chest pain, Cough, palpitations No Abdominal pain, N/V, melena,hematochezia, dark tarry stools, constipation No dysuria, malodorous urine, hematuria or flank pain No new skin rashes, lesions, masses or bruises, No new joint pains, aches, swelling or redness No recent unintentional weight loss No polyuria, polydypsia or polyphagia   Past Medical History:  Diagnosis Date  . Anemia   . CHF (congestive heart failure) ([place])   . CKD (chronic kidney disease), stage IV ([place])   . Depression   . DVT (deep venous thrombosis) ([place])   . Essential hypertension   . GERD (gastroesophageal reflux disease)   . Hyperlipidemia   . Morbid obesity ([place])   . Seizures ([place])    "epilepsy; they think I had one a couple months ago" (12/11/2014)  . Type II diabetes mellitus ([place])     Past Surgical History:  Procedure Laterality Date  . THROMBECTOMY BRACHIAL ARTERY Left 12/15/2014   Procedure: THROMBECTOMY BRACHIAL ARTERY; VEIN PATCH ANGIOPLASTY LEFT BRACIAL ARTERY;  Surgeon: Angelia Mould, MD;  Location: [facility];  Service: Vascular;  Laterality: Left;  . TUBAL LIGATION      Social History   Socioeconomic History  . Marital status: Divorced    Spouse name: Not on file  . Number of children: 2  . Years of education: 10  . Highest education level: Not on file  Occupational History  . Occupation: Retired  Scientific laboratory technician  . Financial resource strain: Not on file  . Food insecurity:  Worry: Not on file    Inability: Not on file  . Transportation needs:    Medical: Not on file    Non-medical: Not on file  Tobacco Use  . Smoking status: Never Smoker  . Smokeless tobacco: Never Used  . Tobacco comment: EXPOSED TO 2ND HAND SMOKE X 20+ YEARS.   Substance and Sexual Activity  . Alcohol use: No  . Drug use: No  . Sexual activity: Never  Lifestyle  . Physical activity:    Days per week: Not on file      Minutes per session: Not on file  . Stress: Not on file  Relationships  . Social connections:    Talks on phone: Not on file    Gets together: Not on file    Attends religious service: Not on file    Active member of club or organization: Not on file    Attends meetings of clubs or organizations: Not on file    Relationship status: Not on file  . Intimate partner violence:    Fear of current or ex partner: Not on file    Emotionally abused: Not on file    Physically abused: Not on file    Forced sexual activity: Not on file  Other Topics Concern  . Not on file  Social History Narrative   Lives in [place] by herself.  Daughters nearby.  Sedentary.   Eats fast food for at least one meal almost daily.   Caffeine use:  Drinks soda daily.   Coffee rarely.     Mobility: Rolling walker Work history: Disabled   Allergies  Allergen Reactions  . Codeine Nausea And Vomiting  . Metoprolol Other (See Comments)    Severe bradycardia.  Now getting admitted for 3rd time in 3 years for bradycardia onset after being started on toprol.  Please dont put patient on this med anymore!    Family History  Problem Relation Age of Onset  . Hypertension Mother        died @ 28 - unknown cause.  [name] Hypertension Father        says she doesn't know anything about her father.  . Cancer Maternal Aunt   . Seizures Neg Hx    t   Prior to Admission medications   Medication Sig Start Date End Date Taking? Authorizing Provider  amLODipine (NORVASC) 10 MG tablet Take 10 mg by mouth daily.   Yes [provider]  Ascorbic Acid (VITAMIN C PO) Take 1 tablet by mouth daily.   Yes [provider]  aspirin 81 MG chewable tablet Chew 1 tablet (81 mg total) by mouth daily. 12/21/14  Yes Rai, Ripudeep K, MD  bisacodyl (DULCOLAX) 5 MG EC tablet Take 2 tablets (10 mg total) by mouth every morning. Patient taking differently: Take 10 mg by mouth daily as needed.  12/21/14  Yes Rai, Ripudeep K, MD   carbamazepine (TEGRETOL) 200 MG tablet Take 400 mg by mouth 2 (two) times daily.    Yes [provider]  Cholecalciferol (VITAMIN D-3) 1000 UNITS CAPS Take 1,000 Units by mouth daily at 12 noon.    Yes [provider]  citalopram (CELEXA) 20 MG tablet Take 20 mg by mouth daily.   Yes [provider]  ezetimibe-simvastatin (VYTORIN) 10-40 MG per tablet Take 1 tablet by mouth daily.   Yes [provider]  furosemide (LASIX) 80 MG tablet Take 2 tablets (160 mg total) by mouth 2 (two) times daily. Patient taking  differently: Take 40 mg by mouth daily.  12/21/14  Yes Rai, Ripudeep K, MD  glipiZIDE (GLUCOTROL XL) 5 MG 24 hr tablet Take 5 mg by mouth daily with breakfast.   Yes [provider]  HUMULIN 70/30 KWIKPEN (70-30) 100 UNIT/ML PEN Inject 10 Units into the skin 2 (two) times daily.  10/18/14  Yes [provider]  hydrALAZINE (APRESOLINE) 25 MG tablet Take 1 tablet (25 mg total) by mouth 2 (two) times daily. Patient taking differently: Take 25 mg by mouth 3 (three) times daily.  12/06/15  Yes Robyn Haber, MD  omeprazole (PRILOSEC) 40 MG capsule Take 40 mg by mouth daily.   Yes [provider]    Physical Exam: Vitals:   05/02/17 1345 05/02/17 1700 05/02/17 1717 05/02/17 1800  BP: (!) 144/65 (!) 185/70 (!) 185/70 (!) 183/89  Pulse: 78 84  (!) 114  Resp: 17 18  (!) 24  Temp:      TempSrc:      SpO2: 98% 100%  95%  Weight:      Height:          Constitutional: NAD, calm, comfortable Eyes: PERRL, lids and conjunctivae normal ENMT: Mucous membranes are moist. Posterior pharynx clear of any exudate or lesions  Neck: normal, supple, no masses, no thyromegaly Respiratory: Diffuse crackles posterior exam. Normal respiratory effort. No accessory muscle use.  4 L Cardiovascular: Regular rate and rhythm, no murmurs / rubs / gallops.  1+ bilateral lower extremity edema. 2+ pedal pulses. No carotid bruits.  Patient obese and  difficult to tell if has JVD Abdomen: no tenderness, no masses palpated. No hepatosplenomegaly. Bowel sounds positive.  Musculoskeletal: no clubbing / cyanosis. No joint deformity upper and lower extremities. Good ROM, no contractures. Normal muscle tone.  Skin: no rashes, lesions, ulcers. No induration Neurologic: CN 2-12 grossly intact. Sensation intact, DTR normal. Strength 5/5 x all 4 extremities.  Psychiatric: Normal judgment and insight. Alert and oriented x 3. Normal mood.    Labs on Admission: I have personally reviewed following labs and imaging studies  CBC: Recent Labs  Lab 05/02/17 1431  WBC 7.8  NEUTROABS 6.9  HGB 9.9*  HCT 30.1*  MCV 90.4  PLT 865   Basic Metabolic Panel: Recent Labs  Lab 05/02/17 1431  NA 139  K 5.9*  CL 110  CO2 17*  GLUCOSE 165*  BUN 43*  CREATININE 2.89*  CALCIUM 8.7*   GFR: Estimated Creatinine Clearance: 21.6 mL/min (A) (by C-G formula based on SCr of 2.89 mg/dL (H)). Liver Function Tests: Recent Labs  Lab 05/02/17 1431  AST 17  ALT 13*  ALKPHOS 84  BILITOT 0.7  PROT 6.9  ALBUMIN 3.6   No results for input(s): LIPASE, AMYLASE in the last 168 hours. No results for input(s): AMMONIA in the last 168 hours. Coagulation Profile: No results for input(s): INR, PROTIME in the last 168 hours. Cardiac Enzymes: Recent Labs  Lab 05/02/17 1431  TROPONINI 0.22*   BNP (last 3 results) No results for input(s): PROBNP in the last 8760 hours. HbA1C: No results for input(s): HGBA1C in the last 72 hours. CBG: No results for input(s): GLUCAP in the last 168 hours. Lipid Profile: No results for input(s): CHOL, HDL, LDLCALC, TRIG, CHOLHDL, LDLDIRECT in the last 72 hours. Thyroid Function Tests: No results for input(s): TSH, T4TOTAL, FREET4, T3FREE, THYROIDAB in the last 72 hours. Anemia Panel: No results for input(s): VITAMINB12, FOLATE, FERRITIN, TIBC, IRON, RETICCTPCT in the last 72 hours. Urine analysis:  Component Value  Date/Time   COLORURINE YELLOW 06/30/2015 0939   APPEARANCEUR CLEAR 06/30/2015 0939   LABSPEC 1.015 06/30/2015 0939   PHURINE 5.5 06/30/2015 0939   GLUCOSEU NEGATIVE 06/30/2015 0939   HGBUR NEGATIVE 06/30/2015 0939   BILIRUBINUR NEGATIVE 06/30/2015 0939   KETONESUR NEGATIVE 06/30/2015 0939   PROTEINUR 100 (A) 06/30/2015 0939   UROBILINOGEN 0.2 11/14/2014 1946   NITRITE NEGATIVE 06/30/2015 0939   LEUKOCYTESUR NEGATIVE 06/30/2015 0939   Sepsis Labs: @LABRCNTIP (procalcitonin:4,lacticidven:4) )No results found for this or any previous visit (from the past 240 hour(s)).   Radiological Exams on Admission: Dg Chest 2 View  Result Date: 05/02/2017 CLINICAL DATA:  Severe shortness of breath since yesterday EXAM: CHEST - 2 VIEW COMPARISON:  12/24/2016 FINDINGS: Enlargement of cardiac silhouette with pulmonary vascular congestion. Atherosclerotic calcifications aorta. Question mild LEFT perihilar infiltrate. Remaining lungs clear. No pleural effusion or pneumothorax. Scattered degenerative changes and scoliosis of the thoracic spine. IMPRESSION: Enlargement of cardiac silhouette with pulmonary vascular congestion. Question mild LEFT perihilar infiltrate. Electronically Signed   By: Lavonia Dana M.D.   On: 05/02/2017 15:19    EKG: (Independently reviewed) sinus rhythm with prolonged PR consistent with first-degree AV block, ventricular rate 80 bpm, QTC 480 ms, normal R wave rotation, no acute ischemic changes  Assessment/Plan Principal Problem:   Acute respiratory failure with hypoxia 2/2 Acute diastolic heart failure  -She presents with shortness of breath and hypoxemia with abnormal chest x-ray and elevated BNP as well as elevated troponin consistent with heart failure exacerbation -Patient admits to noncompliance with medications stating her last dose of Lasix was about 1 week ago-she states she forgets to take medications -In addition she does not adhere to a heart healthy diet stating she can  only afford to eat canned foods; she also has some difficulty with transportation stating she is enrolled in a program [facility] but that she must give several days advance notice to achieve transportation-consult case management -Suspect elevated troponin consistent with demand ischemia but for completeness of evaluation will cycle -Lasix 40 mg IV every 12 hours -Daily weights, strict I's/O -Echocardiogram from 2016: LVH with normal systolic function, no valvular abnormalities -Echocardiogram this admission -Supportive care with oxygen -Continue hydralazine -No beta-blocker secondary to first-degree AV block -Was not on ARB/ACE I prior to admission weekly secondary to severity of CKD  Active Problems:    HTN (hypertension) -Current blood pressure poorly controlled in the context of acute heart failure exacerbation -Continuing hydralazine with Lasix as above but will hold Norvasc initially    Diabetes mellitus type 2, insulin dependent  -Continue Glucotrol and Humulin 70/30 insulin -Follow CBGs and provide SSI -HgbA1c was low at 5.29 June 2015-P this admission    CKD (chronic kidney disease) stage 4, GFR 15-29 ml/min  -Renal function stable and at baseline    Acute hyperkalemia -Not on offending medications prior to admission -1 amp calcium gluconate for cardioprotection -We will repeat electrolyte panel now since has been given Lasix -Follow labs -Continue telemetry    1st degree AV block -Follow on telemetry -Not on CCB/beta-blocker i.e. AVN blocking agents to admission    Seizure disorder -Continue Tegretol    Morbid obesity with BMI of 45.0-49.9, adult     Anemia -Hemoglobin stable and at baseline of around 9.9    Dyslipidemia -Continue Vytorin    **Additional lab, imaging and/or diagnostic evaluation at discretion of supervising physician  DVT prophylaxis: Subcutaneous heparin Code Status: Full Family Communication: No family at bedside  Disposition  Plan: Home Consults called: None-consider heart failure team consultation    Meryle Pugmire L. ANP-BC [facility] Pager [phone]   If 7PM-7AM, please contact night-coverage [email or web address] Password [facility]  05/02/2017, 7:26 PM

## 2017-05-02 NOTE — ED Notes (Signed)
Purewick placed on patient.

## 2017-05-02 NOTE — ED Triage Notes (Signed)
Pt here from home with c/o sob, no chest pain , pt sats on room air were 8^ % placed on 4 liters sats up in to 96%, pt states that she has missed some doses of her lasix

## 2017-05-02 NOTE — ED Notes (Signed)
Patient transported to CT 

## 2017-05-03 ENCOUNTER — Other Ambulatory Visit: Payer: Self-pay

## 2017-05-03 ENCOUNTER — Inpatient Hospital Stay (HOSPITAL_COMMUNITY): Payer: Medicare Other

## 2017-05-03 DIAGNOSIS — I5031 Acute diastolic (congestive) heart failure: Secondary | ICD-10-CM

## 2017-05-03 DIAGNOSIS — Z6841 Body Mass Index (BMI) 40.0 and over, adult: Secondary | ICD-10-CM

## 2017-05-03 DIAGNOSIS — J9601 Acute respiratory failure with hypoxia: Secondary | ICD-10-CM

## 2017-05-03 DIAGNOSIS — I509 Heart failure, unspecified: Secondary | ICD-10-CM

## 2017-05-03 LAB — BASIC METABOLIC PANEL
Anion gap: 8 (ref 5–15)
BUN: 46 mg/dL — AB (ref 6–20)
CALCIUM: 8.8 mg/dL — AB (ref 8.9–10.3)
CO2: 19 mmol/L — AB (ref 22–32)
CREATININE: 2.95 mg/dL — AB (ref 0.44–1.00)
Chloride: 112 mmol/L — ABNORMAL HIGH (ref 101–111)
GFR calc Af Amer: 17 mL/min — ABNORMAL LOW (ref >60)
GFR, EST NON AFRICAN AMERICAN: 15 mL/min — AB (ref >60)
GLUCOSE: 117 mg/dL — AB (ref 65–99)
Potassium: 5.1 mmol/L (ref 3.5–5.1)
SODIUM: 139 mmol/L (ref 135–145)

## 2017-05-03 LAB — GLUCOSE, CAPILLARY
GLUCOSE-CAPILLARY: 51 mg/dL — AB (ref 65–99)
GLUCOSE-CAPILLARY: 75 mg/dL (ref 65–99)
GLUCOSE-CAPILLARY: 96 mg/dL (ref 65–99)
GLUCOSE-CAPILLARY: 115 mg/dL — AB (ref 65–99)
Glucose-Capillary: 213 mg/dL — ABNORMAL HIGH (ref 65–99)

## 2017-05-03 LAB — TROPONIN I: Troponin I: 0.23 ng/mL (ref <0.03)

## 2017-05-03 LAB — ECHOCARDIOGRAM COMPLETE
Height: 62 in
Weight: 3984 oz

## 2017-05-03 MED ORDER — ALUM & MAG HYDROXIDE-SIMETH 200-200-20 MG/5ML PO SUSP
30.0000 mL | ORAL | Status: DC | PRN
  Administered 2017-05-03: 30 mL via ORAL
  Filled 2017-05-03: qty 30

## 2017-05-03 MED ORDER — PERFLUTREN LIPID MICROSPHERE
1.0000 mL | INTRAVENOUS | Status: AC | PRN
  Administered 2017-05-03: 4 mL via INTRAVENOUS
  Filled 2017-05-03: qty 10

## 2017-05-03 MED ORDER — LOPERAMIDE HCL 2 MG PO CAPS
2.0000 mg | ORAL_CAPSULE | ORAL | Status: DC | PRN
  Administered 2017-05-03: 2 mg via ORAL
  Filled 2017-05-03: qty 1

## 2017-05-03 MED ORDER — AMLODIPINE BESYLATE 10 MG PO TABS
10.0000 mg | ORAL_TABLET | Freq: Every day | ORAL | Status: DC
  Administered 2017-05-03 – 2017-05-06 (×4): 10 mg via ORAL
  Filled 2017-05-03 (×4): qty 1

## 2017-05-03 MED ORDER — ORAL CARE MOUTH RINSE
15.0000 mL | Freq: Two times a day (BID) | OROMUCOSAL | Status: DC
  Administered 2017-05-03 – 2017-05-06 (×8): 15 mL via OROMUCOSAL

## 2017-05-03 MED ORDER — FUROSEMIDE 10 MG/ML IJ SOLN
80.0000 mg | Freq: Two times a day (BID) | INTRAMUSCULAR | Status: DC
  Administered 2017-05-03 – 2017-05-05 (×4): 80 mg via INTRAVENOUS
  Filled 2017-05-03 (×4): qty 8

## 2017-05-03 NOTE — Consult Note (Addendum)
Cardiology Consultation:   Patient ID: [name]; [id number]; 1945/08/23   Admit date: 05/02/2017 Date of Consult: 05/03/2017  Primary Care Provider: Benito Mccreedy, MD Primary Cardiologist: New to [facility] - has been seen in consultation by Dr. Angelena Form and Dr. Irish Lack remotely Primary Electrophysiologist:  None   Patient Profile:   [name] is a 72 y.o. female with a PMH of HTN, HLD, DM type 2, seizure disorder, CKD stage IV, and obesity who is being seen today for the evaluation of likely acute on chronic diastolic CHF at the request of Dr. Ree Kida.  History of Present Illness:   [name] was in her usual state of health until Sunday evening when she began having DOE when ambulating to the bathroom. That evening, she experienced orthopnea, but denies recent PND. She did note onset of a cold the week before, with a dry cough at night and some nasal congestion/rhinorrhea. She notes intermittent LE edema which has not changed. She states she has not taken her lasix in over 1 week as she does not like having to urinate frequently. She has not appreciated any weight changes and states she is typically 250lbs. She denies chest pain, dizziness, lightheadedness, syncope, or palpitations. She reports dietary indiscretion due to financial constraints. She eats mostly canned vegetables and states the low sodium ones cost more.   She has not seen a cardiologist in >15 years but does recall being told she has CHF. She has never had any ischemic testing with either a stress test or cardiac catheterization. She denies family history of CAD or CHF. She denies tobacco or ETOH use, however he husband smoked throughout their marriage.   Hospital course: Hypertensive and intermittently tachypneic, otherwise VSS. Labs notable for K 5.9>5.1 (s/p Ca gluconate), Cr 2.89 (at baseline), Hgb 9.9 (at baseline), PLT 178, BNP 1633, Trop 0.22>0.43>0.23, Hgb A1C 5.5. CXR with pulmonary vascular  congestion. EKG with sinus rhythm with 1st degree AV block, no STE/D, no TWI. Echocardiogram pending. Patient received 40mg  IV lasix x2 with net -1.1L output.   Past Medical History:  Diagnosis Date  . Anemia   . CHF (congestive heart failure) ([place])   . CKD (chronic kidney disease), stage IV ([place])   . Depression   . DVT (deep venous thrombosis) ([place])   . Essential hypertension   . GERD (gastroesophageal reflux disease)   . Hyperlipidemia   . Morbid obesity ([place])   . Seizures ([place])    "epilepsy; they think I had one a couple months ago" (12/11/2014)  . Type II diabetes mellitus ([place])     Past Surgical History:  Procedure Laterality Date  . THROMBECTOMY BRACHIAL ARTERY Left 12/15/2014   Procedure: THROMBECTOMY BRACHIAL ARTERY; VEIN PATCH ANGIOPLASTY LEFT BRACIAL ARTERY;  Surgeon: Angelia Mould, MD;  Location: [facility];  Service: Vascular;  Laterality: Left;  . TUBAL LIGATION       Home Medications:  Prior to Admission medications   Medication Sig Start Date End Date Taking? Authorizing Provider  amLODipine (NORVASC) 10 MG tablet Take 10 mg by mouth daily.   Yes [provider]  Ascorbic Acid (VITAMIN C PO) Take 1 tablet by mouth daily.   Yes [provider]  aspirin 81 MG chewable tablet Chew 1 tablet (81 mg total) by mouth daily. 12/21/14  Yes Rai, Ripudeep K, MD  bisacodyl (DULCOLAX) 5 MG EC tablet Take 2 tablets (10 mg total) by mouth every morning. Patient taking differently: Take 10 mg by mouth  daily as needed.  12/21/14  Yes Rai, Ripudeep K, MD  carbamazepine (TEGRETOL) 200 MG tablet Take 400 mg by mouth 2 (two) times daily.    Yes [provider]  Cholecalciferol (VITAMIN D-3) 1000 UNITS CAPS Take 1,000 Units by mouth daily at 12 noon.    Yes [provider]  citalopram (CELEXA) 20 MG tablet Take 20 mg by mouth daily.   Yes [provider]  ezetimibe-simvastatin (VYTORIN) 10-40 MG per tablet Take 1 tablet by mouth daily.   Yes  [provider]  furosemide (LASIX) 80 MG tablet Take 2 tablets (160 mg total) by mouth 2 (two) times daily. Patient taking differently: Take 40 mg by mouth daily.  12/21/14  Yes Rai, Ripudeep K, MD  glipiZIDE (GLUCOTROL XL) 5 MG 24 hr tablet Take 5 mg by mouth daily with breakfast.   Yes [provider]  HUMULIN 70/30 KWIKPEN (70-30) 100 UNIT/ML PEN Inject 10 Units into the skin 2 (two) times daily.  10/18/14  Yes [provider]  hydrALAZINE (APRESOLINE) 25 MG tablet Take 1 tablet (25 mg total) by mouth 2 (two) times daily. Patient taking differently: Take 25 mg by mouth 3 (three) times daily.  12/06/15  Yes Robyn Haber, MD  omeprazole (PRILOSEC) 40 MG capsule Take 40 mg by mouth daily.   Yes [provider]    Inpatient Medications: Scheduled Meds: . aspirin  81 mg Oral Daily  . carbamazepine  400 mg Oral BID  . citalopram  20 mg Oral Daily  . ezetimibe-simvastatin  1 tablet Oral Daily  . furosemide  40 mg Intravenous Q12H  . glipiZIDE  5 mg Oral Q breakfast  . heparin  5,000 Units Subcutaneous Q8H  . hydrALAZINE  25 mg Oral TID  . insulin aspart  0-5 Units Subcutaneous QHS  . insulin aspart  0-9 Units Subcutaneous TID WC  . insulin aspart protamine- aspart  10 Units Subcutaneous BID  . mouth rinse  15 mL Mouth Rinse BID  . pantoprazole  40 mg Oral Daily  . sodium chloride flush  3 mL Intravenous Q12H   Continuous Infusions: . sodium chloride 250 mL (05/02/17 2208)   PRN Meds: sodium chloride, acetaminophen, ondansetron (ZOFRAN) IV, perflutren lipid microspheres (DEFINITY) IV suspension, sodium chloride flush  Allergies:    Allergies  Allergen Reactions  . Codeine Nausea And Vomiting  . Metoprolol Other (See Comments)    Severe bradycardia.  Now getting admitted for 3rd time in 3 years for bradycardia onset after being started on toprol.  Please dont put patient on this med anymore!    Social History:   Social History    Socioeconomic History  . Marital status: Divorced    Spouse name: Not on file  . Number of children: 2  . Years of education: 10  . Highest education level: Not on file  Occupational History  . Occupation: Retired  Scientific laboratory technician  . Financial resource strain: Not on file  . Food insecurity:    Worry: Not on file    Inability: Not on file  . Transportation needs:    Medical: Not on file    Non-medical: Not on file  Tobacco Use  . Smoking status: Never Smoker  . Smokeless tobacco: Never Used  . Tobacco comment: EXPOSED TO 2ND HAND SMOKE X 20+ YEARS.   Substance and Sexual Activity  . Alcohol use: No  . Drug use: No  . Sexual activity: Never  Lifestyle  . Physical activity:  Days per week: Not on file    Minutes per session: Not on file  . Stress: Not on file  Relationships  . Social connections:    Talks on phone: Not on file    Gets together: Not on file    Attends religious service: Not on file    Active member of club or organization: Not on file    Attends meetings of clubs or organizations: Not on file    Relationship status: Not on file  . Intimate partner violence:    Fear of current or ex partner: Not on file    Emotionally abused: Not on file    Physically abused: Not on file    Forced sexual activity: Not on file  Other Topics Concern  . Not on file  Social History Narrative   Lives in [place] by herself.  Daughters nearby.  Sedentary.   Eats fast food for at least one meal almost daily.   Caffeine use:  Drinks soda daily.   Coffee rarely.     Family History:    Family History  Problem Relation Age of Onset  . Hypertension Mother        died @ 67 - unknown cause.  [name] Hypertension Father        says she doesn't know anything about her father.  . Cancer Maternal Aunt   . Seizures Neg Hx      ROS:  Please see the history of present illness.   All other ROS reviewed and negative.     Physical Exam/Data:   Vitals:   05/02/17 2026 05/02/17 2352  05/03/17 0445 05/03/17 0812  BP: (!) 170/76 (!) 151/62  (!) 149/51  Pulse: 74 72  62  Resp: (!) 24   (!) 25  Temp: 99.1 F (37.3 C) 99.2 F (37.3 C)  98.6 F (37 C)  TempSrc: Oral Oral  Oral  SpO2: 100% 100%  100%  Weight: 248 lb 9.6 oz (112.8 kg)  249 lb (112.9 kg)   Height: 5\' 2"  (1.575 m)       Intake/Output Summary (Last 24 hours) at 05/03/2017 1100 Last data filed at 05/03/2017 0417 Gross per 24 hour  Intake 376.67 ml  Output 1550 ml  Net -1173.33 ml   Filed Weights   05/02/17 1331 05/02/17 2026 05/03/17 0445  Weight: 250 lb (113.4 kg) 248 lb 9.6 oz (112.8 kg) 249 lb (112.9 kg)   Body mass index is 45.54 kg/m.  General:  Morbidly obese AAF. Laying in bed in no acute distress HEENT: sclera anicteric  Neck: unable to appreciate JVD due to body habitus Vascular: No carotid bruits; distal pulses 2+ bilaterally Cardiac:  normal S1, S2; RRR; no murmurs, gallops, or rubs appreciated.  Lungs:  clear to auscultation bilaterally, no wheezing, rhonchi or rales  Abd: NABS, soft, nontender, no hepatomegaly Ext: trace LE edema Musculoskeletal:  No deformities, BUE and BLE strength normal and equal Skin: warm and dry  Neuro:  CNs 2-12 intact, no focal abnormalities noted Psych:  Normal affect   EKG:  The EKG was personally reviewed and demonstrates:  Sinus rhythm with 1st degree AV block, no STE/D, no TWI Telemetry:  Telemetry was personally reviewed and demonstrates:  Sinus with 1st degree AV block  Relevant CV Studies:  Echocardiogram 05/03/17: Study Conclusions  - Procedure narrative: Transthoracic echocardiography. Image   quality was suboptimal. The study was technically difficult, as a   result of poor sound wave transmission and body habitus.  Intravenous contrast (Definity) was administered. - Left ventricle: The cavity size was normal. Wall thickness was   increased in a pattern of moderate LVH. Systolic function was   normal. The estimated ejection fraction was in  the range of 60%   to 65%. Doppler parameters are consistent with abnormal left   ventricular relaxation (grade 1 diastolic dysfunction). The E/e&'   ratio is >15, suggesting elevated LV filling pressure. - Left atrium: The atrium was normal in size. - Pericardium, extracardiac: A trivial pericardial effusion was   identified posterior to the heart.  Impressions:  - Techically difficult study. Definity contrast given. LVEF 60-65%,   moderate LVH, grade 1 DD, elevated LV filling pressure, trivial   posterior pericardial effusion.   Laboratory Data:  Chemistry Recent Labs  Lab 05/02/17 1431 05/02/17 1836 05/03/17 0237  NA 139 137 139  K 5.9* 5.8* 5.1  CL 110 111 112*  CO2 17* 20* 19*  GLUCOSE 165* 169* 117*  BUN 43* 44* 46*  CREATININE 2.89* 2.88* 2.95*  CALCIUM 8.7* 8.6* 8.8*  GFRNONAA 15* 16* 15*  GFRAA 18* 18* 17*  ANIONGAP 12 6 8     Recent Labs  Lab 05/02/17 1431  PROT 6.9  ALBUMIN 3.6  AST 17  ALT 13*  ALKPHOS 84  BILITOT 0.7   Hematology Recent Labs  Lab 05/02/17 1431  WBC 7.8  RBC 3.33*  HGB 9.9*  HCT 30.1*  MCV 90.4  MCH 29.7  MCHC 32.9  RDW 12.6  PLT 178   Cardiac Enzymes Recent Labs  Lab 05/02/17 1431 05/02/17 1750 05/02/17 2103 05/03/17 0853  TROPONINI 0.22* 0.39* 0.43* 0.23*   No results for input(s): TROPIPOC in the last 168 hours.  BNP Recent Labs  Lab 05/02/17 1431  BNP 1,633.3*    DDimer No results for input(s): DDIMER in the last 168 hours.  Radiology/Studies:  Dg Chest 2 View  Result Date: 05/02/2017 CLINICAL DATA:  Severe shortness of breath since yesterday EXAM: CHEST - 2 VIEW COMPARISON:  12/24/2016 FINDINGS: Enlargement of cardiac silhouette with pulmonary vascular congestion. Atherosclerotic calcifications aorta. Question mild LEFT perihilar infiltrate. Remaining lungs clear. No pleural effusion or pneumothorax. Scattered degenerative changes and scoliosis of the thoracic spine. IMPRESSION: Enlargement of cardiac  silhouette with pulmonary vascular congestion. Question mild LEFT perihilar infiltrate. Electronically Signed   By: Lavonia Dana M.D.   On: 05/02/2017 15:19    Assessment and Plan:   1. Acute respiratory failure in the setting of acute on chronic diastolic CHF: p/w SOB x2 days. Found to be hypoxic with O2 sats in the 80s on presentation. Has been non-compliant with lasix, diet, and fluid restriction. CXR with pulmonary vascular congestion. BNP >1600. EKG without ischemic changes. Echocardiogram with EF 60-65% and G1DD. Patient received 40mg  IV lasix x2 with net -1.1L output.  - Continue diuresis with IV lasix 40mg  BID - Continue to monitor electrolytes, strict I&Os, and daily weights - Not on a BBlocker due to prior issues with bradycardia  - Unable to add ACEi/ARB due to CKD - Continue hydralazine for BP control  2. Elevated troponin: patient is without complaints of CP. Trop trend 0.22>0.43>0.23. EKG without ischemic changes. Suspect elevation is 2/2 demand ischemia in the setting of acute CHF. She has never had an ischemic evaluation. Risk factors for ACS include HTN, HLD, DM, and obesity. - Echo unable to assess wall motion due to body habitus; EF 60-65% - Can likely obtain a NST outpatient if echo with a normal EF  and no wall motion abnormalities.   3. HTN: BP elevated - Could consider increasing hydralazine for better BP control  4. HLD: no recent lipids on file - Continue vytroin   5. DM type 2: HgbA1C 5.5; at goal of <7 - Continue management per primary team  6. CKD stage IV: Cr at baseline - Continue to monitor closely with diuresis  7. Hyperkalemia: K improved to 5.1 with calcium gluconate - Continue to monitor closely with diuresis  8. 1st degree AV block: has had intermittently on previous EKGs. Not on AV nodal blocking agents. - Continue to monitor on telemetry  9. Obesity: sedentary lifestyle and financial difficulties driving food choices.  - Continue to encourage  activity and heart healthy diet   For questions or updates, please contact [facility] Please consult [email or web address] for contact info under Cardiology/STEMI.   Signed, Abigail Butts, PA-C  05/03/2017 11:00 AM [phone]  Personally seen and examined. Agree with above.  72 year old with acute on chronic diastolic heart failure and morbid obesity with DM with HTN, HL and elevated troponin consistent with demand ischemia.   Breathing a little better, no CP  Exam: obese, alert, distant breath sounds, RRR, 1+ LE edema  Labs: K 5.1 from 5.8, Creat 2.95  A/P:  Acute on chronic diastolic heart failure  - EF normal. Lasix IV, continue, but will increase to 80mg  BID  - Monitor creatinine which remains elevated.   - Restarted amlodipine to improve BP (on home med list)   Morbid obesity  - likely at the crux of her issues. Continue to advocate weight loss.   DM with HTN, HL  - increases CV risk. Currently well controlled.  Elevated troponin  - likely demand ischemia in the setting of HTN and diastolic HF.   Will follow.  Candee Furbish, MD

## 2017-05-03 NOTE — Care Management Note (Signed)
Case Management Note  Patient Details  Name: [name] MRN: [id number] Date of Birth: May 09, 1945  Subjective/Objective:                 Spoke w patient's sister I the hallway. She states patient lives at home alone. Patient's daughter provides assistance taking her to appointment, errands. Sister, [name], states she also checks on her at home too. Patient has a RW and a shower seat at home. Patient was not taking diuretic due to it making her urinate too much per her sister. She now understands the dangers of this. Patient does not have home oxygen. PT order placed.    Action/Plan:  CM will continue to follow.    Expected Discharge Date:                  Expected Discharge Plan:  [place]  In-House Referral:     Discharge planning Services  CM Consult  Post Acute Care Choice:    Choice offered to:     DME Arranged:    DME Agency:     HH Arranged:    [place] Agency:     Status of Service:  In process, will continue to follow  If discussed at Long Length of Stay Meetings, dates discussed:    Additional Comments:  Carles Collet, RN 05/03/2017, 10:36 AM

## 2017-05-03 NOTE — Progress Notes (Signed)
Patient found to be hypoglycemic this morning. She was asymptomatic, A&OX4, sitting up talking in bed. Gave her 4 ounces of oj per hypoglycemic protocol and rechecked her sugar approximately 15 minutes later. Result was 75. Patient ate very poorly at breakfast immediately following this episode.

## 2017-05-03 NOTE — Progress Notes (Signed)
I have assessed the patient throughout the day and agree with [facility] charting.

## 2017-05-03 NOTE — Progress Notes (Signed)
PROGRESS NOTE    [name]  YQI:[id number] DOB: [birth year] DOA: 05/02/2017 PCP: Benito Mccreedy, MD   Brief Narrative:  72 year old female with history of CKD, stage IV, hypertension, diabetes, seizure disorder, presented to the emergency department with complaints of shortness of breath.  Patient admitted for acute hypoxic respiratory failure in the setting of CHF exacerbation.  Cardiology consulted and appreciated.  Currently diuresing. Assessment & Plan   Acute hypoxic respiratory failure secondary to acute diastolic heart failure exacerbation -Patient presented with SPO2 in the 80s with elevated BNP 1633 -Patient admits to stopping her Lasix 1 week ago as it made her go to the bathroom frequently. -CXR showed evidence of vascular congestion -Echocardiogram in 2016 unremarkable -Echocardiogram obtained this admission shows an EF of 38-75%, grade 1 diastolic dysfunction -Cardiology consulted and appreciated -Increasing Lasix dose to 40 mg twice daily IV -Monitor intake and output, daily weights -Currently cannot use ACE inhibitor or ARB given chronic kidney disease.  Avoiding beta-blocker due to bradycardia -Counseled patient on the need for continued use of medications as prescribed.  Essential hypertension -Continue hydralazine and IV lasix amlodipine added   Diabetes mellitus, type II -Hemoglobin A1c 5.5 Continue glipizide, 70/30, and insulin sliding scale and CBG monitoring  CKD, stage IV -Creatinine currently stable, continue to monitor BMP closely given the patient will be diuresing  Hyperkalemia -Resolved, continue to monitor BMP  Elevated troponin -Suspect secondary to demand ischemia from CHF exacerbation in the setting of CKD -Troponin trending downward -Currently denies any chest pain  Seizure disorder -Stable, continue Tegretol  Morbid obesity -BMI over 45 -weight control and interventions to be discussed with her primary care physician -Consult  nutrition to discuss healthier food choices  Dyslipidemia -Continue vytorin  Anemia of chronic disease -Appears to be stable, continue to monitor CBC  First-degree AV block -Use EKGs.  Continue to monitor  Noncompliance -Admits to noncompliance with medications, diet as well as doctor's appointments.  Case management as well as social work consulted to help arrange transportation, etc.  DVT Prophylaxis  Heparin  Code Status: Full  Family Communication: None at bedside  Disposition Plan: Admitted. Pending improvement  Consultants Cardiology  Procedures  Echocardiogram  Antibiotics   Anti-infectives (From admission, onward)   None      Subjective:   [name] seen and examined today.  Feels breathing has improved.  Denies any current chest pain, abdominal pain, nausea or vomiting, diarrhea or constipation, dizziness or headache.  Objective:   Vitals:   05/02/17 2026 05/02/17 2352 05/03/17 0445 05/03/17 0812  BP: (!) 170/76 (!) 151/62  (!) 149/51  Pulse: 74 72  62  Resp: (!) 24   (!) 25  Temp: 99.1 F (37.3 C) 99.2 F (37.3 C)  98.6 F (37 C)  TempSrc: Oral Oral  Oral  SpO2: 100% 100%  100%  Weight: 112.8 kg (248 lb 9.6 oz)  112.9 kg (249 lb)   Height: 5\' 2"  (1.575 m)       Intake/Output Summary (Last 24 hours) at 05/03/2017 1443 Last data filed at 05/03/2017 1100 Gross per 24 hour  Intake 776.67 ml  Output 1550 ml  Net -773.33 ml   Filed Weights   05/02/17 1331 05/02/17 2026 05/03/17 0445  Weight: 113.4 kg (250 lb) 112.8 kg (248 lb 9.6 oz) 112.9 kg (249 lb)    Exam  General: Well developed, morbidly obese, NAD  HEENT: NCAT, PERRLA, EOMI, Anicteic Sclera, mucous membranes moist.   Neck: Supple, cannot appreciate JVD  due to body habitus  Cardiovascular: S1 S2 auscultated, no rubs, murmurs or gallops. Regular rate and rhythm.  Respiratory: Clear to auscultation bilaterally with equal chest rise; no wheezing   Abdomen: Soft, obese, nontender,  nondistended, + bowel sounds  Extremities: warm dry without cyanosis clubbing. +LE edema  Neuro: AAOx3,nonfocal  Skin: Without rashes exudates or nodules  Psych: Normal affect and demeanor with intact judgement and insight   Data Reviewed: I have personally reviewed following labs and imaging studies  CBC: Recent Labs  Lab 05/02/17 1431  WBC 7.8  NEUTROABS 6.9  HGB 9.9*  HCT 30.1*  MCV 90.4  PLT 948   Basic Metabolic Panel: Recent Labs  Lab 05/02/17 1431 05/02/17 1836 05/03/17 0237  NA 139 137 139  K 5.9* 5.8* 5.1  CL 110 111 112*  CO2 17* 20* 19*  GLUCOSE 165* 169* 117*  BUN 43* 44* 46*  CREATININE 2.89* 2.88* 2.95*  CALCIUM 8.7* 8.6* 8.8*   GFR: Estimated Creatinine Clearance: 21.1 mL/min (A) (by C-G formula based on SCr of 2.95 mg/dL (H)). Liver Function Tests: Recent Labs  Lab 05/02/17 1431  AST 17  ALT 13*  ALKPHOS 84  BILITOT 0.7  PROT 6.9  ALBUMIN 3.6   No results for input(s): LIPASE, AMYLASE in the last 168 hours. No results for input(s): AMMONIA in the last 168 hours. Coagulation Profile: No results for input(s): INR, PROTIME in the last 168 hours. Cardiac Enzymes: Recent Labs  Lab 05/02/17 1431 05/02/17 1750 05/02/17 2103 05/03/17 0853  TROPONINI 0.22* 0.39* 0.43* 0.23*   BNP (last 3 results) No results for input(s): PROBNP in the last 8760 hours. HbA1C: Recent Labs    05/02/17 2103  HGBA1C 5.5   CBG: Recent Labs  Lab 05/02/17 2219 05/03/17 0742 05/03/17 0809 05/03/17 1140  GLUCAP 76 51* 75 96   Lipid Profile: No results for input(s): CHOL, HDL, LDLCALC, TRIG, CHOLHDL, LDLDIRECT in the last 72 hours. Thyroid Function Tests: No results for input(s): TSH, T4TOTAL, FREET4, T3FREE, THYROIDAB in the last 72 hours. Anemia Panel: No results for input(s): VITAMINB12, FOLATE, FERRITIN, TIBC, IRON, RETICCTPCT in the last 72 hours. Urine analysis:    Component Value Date/Time   COLORURINE YELLOW 06/30/2015 0939   APPEARANCEUR  CLEAR 06/30/2015 0939   LABSPEC 1.015 06/30/2015 0939   PHURINE 5.5 06/30/2015 0939   GLUCOSEU NEGATIVE 06/30/2015 0939   HGBUR NEGATIVE 06/30/2015 0939   BILIRUBINUR NEGATIVE 06/30/2015 0939   KETONESUR NEGATIVE 06/30/2015 0939   PROTEINUR 100 (A) 06/30/2015 0939   UROBILINOGEN 0.2 11/14/2014 1946   NITRITE NEGATIVE 06/30/2015 0939   LEUKOCYTESUR NEGATIVE 06/30/2015 0939   Sepsis Labs: @LABRCNTIP (procalcitonin:4,lacticidven:4)  )No results found for this or any previous visit (from the past 240 hour(s)).    Radiology Studies: Dg Chest 2 View  Result Date: 05/02/2017 CLINICAL DATA:  Severe shortness of breath since yesterday EXAM: CHEST - 2 VIEW COMPARISON:  12/24/2016 FINDINGS: Enlargement of cardiac silhouette with pulmonary vascular congestion. Atherosclerotic calcifications aorta. Question mild LEFT perihilar infiltrate. Remaining lungs clear. No pleural effusion or pneumothorax. Scattered degenerative changes and scoliosis of the thoracic spine. IMPRESSION: Enlargement of cardiac silhouette with pulmonary vascular congestion. Question mild LEFT perihilar infiltrate. Electronically Signed   By: Lavonia Dana M.D.   On: 05/02/2017 15:19     Scheduled Meds: . amLODipine  10 mg Oral Daily  . aspirin  81 mg Oral Daily  . carbamazepine  400 mg Oral BID  . citalopram  20 mg Oral Daily  .  ezetimibe-simvastatin  1 tablet Oral Daily  . furosemide  80 mg Intravenous BID  . glipiZIDE  5 mg Oral Q breakfast  . heparin  5,000 Units Subcutaneous Q8H  . hydrALAZINE  25 mg Oral TID  . insulin aspart  0-5 Units Subcutaneous QHS  . insulin aspart  0-9 Units Subcutaneous TID WC  . insulin aspart protamine- aspart  10 Units Subcutaneous BID  . mouth rinse  15 mL Mouth Rinse BID  . pantoprazole  40 mg Oral Daily  . sodium chloride flush  3 mL Intravenous Q12H   Continuous Infusions: . sodium chloride 250 mL (05/02/17 2208)     LOS: 1 day   Time Spent in minutes   30 minutes  [name] D.O. on 05/03/2017 at 2:43 PM  Between 7am to 7pm - Pager - [phone]  After 7pm go to [email or web address] - password [email or web address]  And look for the night coverage person covering for me after hours  [facility] Office  [phone]

## 2017-05-03 NOTE — Progress Notes (Signed)
  Echocardiogram 2D Echocardiogram with definity has been performed.  Darlina Sicilian M 05/03/2017, 9:56 AM

## 2017-05-03 NOTE — Consult Note (Signed)
   [facility] CM Inpatient Consult   05/03/2017  [name] 06/05/1945 [id number]  Referral received from inpatient RNCM, [place] regarding patient in [place].  Patient screened for potential [place] [facility] services and needs.  Met with the patient at the bedside to assess for needs and introduce to community care management and disease management program for heart disease and COPD.  Patient confirms her insurance is [employer].  Her primary care provider Dr. Almedia Balls is confirmed as well. Patient states I think that they are suggesting that I get some rehab before I go back home.  Explained [place] [facility] services in the post hospital follow up as needed and appropriate.  Patient wanted to review information.  Brochure and contact information was given.  Will follow for progress and with inpatient care management team for disposition.    Thanks for the referral and  for questions contact:   Natividad Brood, RN BSN [facility] Liaison  [phone] business mobile phone Toll free office [phone]  '

## 2017-05-03 NOTE — Evaluation (Signed)
Physical Therapy Evaluation Patient Details Name: [name] MRN: [id number] DOB: 1945/03/17 Today's Date: 05/03/2017   History of Present Illness  72 yo female with onset of mild troponin elevation, acute CHF and respiratory failure was admitted for SOB and weakness, noted to have demand ischemia.  PMHx:  thoracic scoliosis, CKD 4, obesity, CHF, HTN, DVT, seizures, DM  Clinical Impression  Pt is evaluated for mobility and is requiring continual supervision and help to walk  Her plan is to ask for SNF care unless she has consistent help at home for more than 1-2 days.  Her sister is available for only this short time frame which is not enough to get past weakness accumulated by the illness.  Will progress as tolerated acutely to shorten the need for a stay.    Follow Up Recommendations SNF    Equipment Recommendations  None recommended by PT    Recommendations for Other Services       Precautions / Restrictions Precautions Precautions: Fall(telemetry) Restrictions Weight Bearing Restrictions: No      Mobility  Bed Mobility Overal bed mobility: Needs Assistance Bed Mobility: Supine to Sit     Supine to sit: Min assist     General bed mobility comments: cues for hand placement and control of descent to chair.  Transfers Overall transfer level: Needs assistance Equipment used: Rolling walker (2 wheeled);1 person hand held assist Transfers: Sit to/from Stand Sit to Stand: Min assist         General transfer comment: reminders to pt for control of sitting and hand placement  Ambulation/Gait Ambulation/Gait assistance: Min guard Ambulation Distance (Feet): 6 Feet Assistive device: Rolling walker (2 wheeled);1 person hand held assist Gait Pattern/deviations: Step-through pattern;Decreased stride length;Shuffle;Wide base of support Gait velocity: reduced Gait velocity interpretation: Below normal speed for age/gender General Gait Details: Pt is getting bed to chair  with some difficulty and weakness to control knees and hips.  Stairs            Wheelchair Mobility    Modified Rankin (Stroke Patients Only)       Balance Overall balance assessment: History of Falls;Needs assistance Sitting-balance support: Feet supported Sitting balance-Leahy Scale: Fair     Standing balance support: Bilateral upper extremity supported;During functional activity Standing balance-Leahy Scale: Fair                               Pertinent Vitals/Pain Pain Assessment: No/denies pain    Home Living Family/patient expects to be discharged to:: Private residence Living Arrangements: Alone Available Help at Discharge: Family;Available PRN/intermittently Type of Home: Apartment Home Access: Level entry Entrance Stairs-Rails: None   Home Layout: One level Home Equipment: Walker - 2 wheels;Tub bench;Cane - single point      Prior Function Level of Independence: Needs assistance   Gait / Transfers Assistance Needed: Uses RW at baseline. Does not drive.   ADL's / Homemaking Assistance Needed: Performing ADLs with some difficulty. Daughter helps with grocery shopping.        Hand Dominance   Dominant Hand: Right    Extremity/Trunk Assessment   Upper Extremity Assessment Upper Extremity Assessment: Overall WFL for tasks assessed    Lower Extremity Assessment Lower Extremity Assessment: Generalized weakness    Cervical / Trunk Assessment Cervical / Trunk Assessment: Normal  Communication   Communication: No difficulties  Cognition Arousal/Alertness: Awake/alert Behavior During Therapy: WFL for tasks assessed/performed Overall Cognitive Status: Within Functional Limits  for tasks assessed                                        General Comments General comments (skin integrity, edema, etc.): Pt is up to walk short trips and is demonstrating poor dynamic balance wiht RW, unsafe and is reluctant to commit to SNF  due to previous bad experience.  Talked with her about speaking to trusted family to see if another option is reasonable    Exercises     Assessment/Plan    PT Assessment Patient needs continued PT services  PT Problem List Decreased strength;Decreased range of motion;Decreased balance;Decreased activity tolerance;Decreased mobility;Decreased coordination;Decreased knowledge of use of DME;Decreased safety awareness;Cardiopulmonary status limiting activity;Obesity       PT Treatment Interventions DME instruction;Gait training;Functional mobility training;Therapeutic activities;Therapeutic exercise;Balance training;Neuromuscular re-education;Patient/family education    PT Goals (Current goals can be found in the Care Plan section)  Acute Rehab PT Goals Patient Stated Goal: to walk and go home PT Goal Formulation: With patient Time For Goal Achievement: 05/17/17 Potential to Achieve Goals: Good    Frequency Min 2X/week   Barriers to discharge Inaccessible home environment;Decreased caregiver support home with no help to manage on a walker consistently, needs help with all gait    Co-evaluation               AM-PAC PT "6 Clicks" Daily Activity  Outcome Measure Difficulty turning over in bed (including adjusting bedclothes, sheets and blankets)?: A Little Difficulty moving from lying on back to sitting on the side of the bed? : Unable Difficulty sitting down on and standing up from a chair with arms (e.g., wheelchair, bedside commode, etc,.)?: Unable Help needed moving to and from a bed to chair (including a wheelchair)?: A Little Help needed walking in hospital room?: A Little Help needed climbing 3-5 steps with a railing? : A Lot 6 Click Score: 13    End of Session Equipment Utilized During Treatment: Gait belt;Oxygen Activity Tolerance: Patient limited by fatigue;Treatment limited secondary to medical complications (Comment)(O2 sats were 98% pregait and post were  97%) Patient left: in chair;with call bell/phone within reach;with chair alarm set Nurse Communication: Mobility status PT Visit Diagnosis: Unsteadiness on feet (R26.81);Muscle weakness (generalized) (M62.81);Adult, failure to thrive (R62.7)    Time: 9470-9628 PT Time Calculation (min) (ACUTE ONLY): 36 min   Charges:   PT Evaluation $PT Eval Moderate Complexity: 1 Mod PT Treatments $Gait Training: 8-22 mins   PT G Codes:   PT G-Codes **NOT FOR INPATIENT CLASS** Functional Assessment Tool Used: AM-PAC 6 Clicks Basic Mobility   DARYA BIGLER 05/03/2017, 4:41 PM   Mee Hives, PT MS Acute Rehab Dept. Number: [place] and [place]

## 2017-05-04 DIAGNOSIS — I10 Essential (primary) hypertension: Secondary | ICD-10-CM

## 2017-05-04 DIAGNOSIS — I5033 Acute on chronic diastolic (congestive) heart failure: Secondary | ICD-10-CM

## 2017-05-04 DIAGNOSIS — N184 Chronic kidney disease, stage 4 (severe): Secondary | ICD-10-CM

## 2017-05-04 DIAGNOSIS — R569 Unspecified convulsions: Secondary | ICD-10-CM

## 2017-05-04 DIAGNOSIS — E119 Type 2 diabetes mellitus without complications: Secondary | ICD-10-CM

## 2017-05-04 DIAGNOSIS — I440 Atrioventricular block, first degree: Secondary | ICD-10-CM

## 2017-05-04 DIAGNOSIS — Z794 Long term (current) use of insulin: Secondary | ICD-10-CM

## 2017-05-04 DIAGNOSIS — E875 Hyperkalemia: Secondary | ICD-10-CM

## 2017-05-04 LAB — CBC
HEMATOCRIT: 34.7 % — AB (ref 36.0–46.0)
Hemoglobin: 11.6 g/dL — ABNORMAL LOW (ref 12.0–15.0)
MCH: 30.3 pg (ref 26.0–34.0)
MCHC: 33.4 g/dL (ref 30.0–36.0)
MCV: 90.6 fL (ref 78.0–100.0)
PLATELETS: 210 10*3/uL (ref 150–400)
RBC: 3.83 MIL/uL — ABNORMAL LOW (ref 3.87–5.11)
RDW: 12.9 % (ref 11.5–15.5)
WBC: 6.7 10*3/uL (ref 4.0–10.5)

## 2017-05-04 LAB — BASIC METABOLIC PANEL
Anion gap: 13 (ref 5–15)
BUN: 49 mg/dL — AB (ref 6–20)
CHLORIDE: 104 mmol/L (ref 101–111)
CO2: 23 mmol/L (ref 22–32)
CREATININE: 3.02 mg/dL — AB (ref 0.44–1.00)
Calcium: 9 mg/dL (ref 8.9–10.3)
GFR calc Af Amer: 17 mL/min — ABNORMAL LOW (ref >60)
GFR calc non Af Amer: 15 mL/min — ABNORMAL LOW (ref >60)
GLUCOSE: 71 mg/dL (ref 65–99)
POTASSIUM: 5.2 mmol/L — AB (ref 3.5–5.1)
SODIUM: 140 mmol/L (ref 135–145)

## 2017-05-04 LAB — GLUCOSE, CAPILLARY
GLUCOSE-CAPILLARY: 175 mg/dL — AB (ref 65–99)
Glucose-Capillary: 65 mg/dL (ref 65–99)
Glucose-Capillary: 85 mg/dL (ref 65–99)
Glucose-Capillary: 162 mg/dL — ABNORMAL HIGH (ref 65–99)
Glucose-Capillary: 210 mg/dL — ABNORMAL HIGH (ref 65–99)
Glucose-Capillary: 92 mg/dL (ref 65–99)

## 2017-05-04 MED ORDER — HYDRALAZINE HCL 20 MG/ML IJ SOLN
10.0000 mg | Freq: Once | INTRAMUSCULAR | Status: AC
  Administered 2017-05-04: 10 mg via INTRAVENOUS
  Filled 2017-05-04: qty 1
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
## 2017-05-04 NOTE — Progress Notes (Addendum)
Progress Note  Patient Name: ARACELLI WOLOSZYN Date of Encounter: 05/04/2017  Primary Cardiologist: Lauree Chandler, MD   Subjective   Reports feeling better this morning. Breathing not quite back to baseline yet. Has not been out of bed today. Encouraged to ambulate around the unit. Denies chest pain or palpitations. No new complaints  Inpatient Medications    Scheduled Meds: . amLODipine  10 mg Oral Daily  . aspirin  81 mg Oral Daily  . carbamazepine  400 mg Oral BID  . citalopram  20 mg Oral Daily  . ezetimibe-simvastatin  1 tablet Oral Daily  . furosemide  80 mg Intravenous BID  . glipiZIDE  5 mg Oral Q breakfast  . heparin  5,000 Units Subcutaneous Q8H  . hydrALAZINE  25 mg Oral TID  . insulin aspart  0-5 Units Subcutaneous QHS  . insulin aspart  0-9 Units Subcutaneous TID WC  . insulin aspart protamine- aspart  10 Units Subcutaneous BID  . mouth rinse  15 mL Mouth Rinse BID  . pantoprazole  40 mg Oral Daily  . sodium chloride flush  3 mL Intravenous Q12H   Continuous Infusions: . sodium chloride 250 mL (05/02/17 2208)   PRN Meds: sodium chloride, acetaminophen, alum & mag hydroxide-simeth, loperamide, ondansetron (ZOFRAN) IV, sodium chloride flush   Vital Signs    Vitals:   05/04/17 0034 05/04/17 0100 05/04/17 0200 05/04/17 0523  BP: (!) 190/70 (!) 190/65 (!) 162/54   Pulse: 70 65 63   Resp:      Temp: 99.1 F (37.3 C)     TempSrc: Oral     SpO2: 100%     Weight:    242 lb 12.8 oz (110.1 kg)  Height:        Intake/Output Summary (Last 24 hours) at 05/04/2017 0817 Last data filed at 05/04/2017 0524 Gross per 24 hour  Intake 1416 ml  Output 1260 ml  Net 156 ml   Filed Weights   05/02/17 2026 05/03/17 0445 05/04/17 0523  Weight: 248 lb 9.6 oz (112.8 kg) 249 lb (112.9 kg) 242 lb 12.8 oz (110.1 kg)    Telemetry    Sinus rhythm with 1st degree AV block - Personally Reviewed  Physical Exam   GEN: Obese female laying in bed in no acute distress.     Neck: JVD difficult to appreciate, no carotid bruits Cardiac: RRR, no murmurs, rubs, or gallops.  Respiratory: fine crackles at bases; otherwise clear GI: NABS, Soft, nontender, non-distended  MS: No edema; No deformity. Neuro:  Nonfocal, moving all extremities spontaneously Psych: Normal affect   Labs    Chemistry Recent Labs  Lab 05/02/17 1431 05/02/17 1836 05/03/17 0237 05/04/17 0231  NA 139 137 139 140  K 5.9* 5.8* 5.1 5.2*  CL 110 111 112* 104  CO2 17* 20* 19* 23  GLUCOSE 165* 169* 117* 71  BUN 43* 44* 46* 49*  CREATININE 2.89* 2.88* 2.95* 3.02*  CALCIUM 8.7* 8.6* 8.8* 9.0  PROT 6.9  --   --   --   ALBUMIN 3.6  --   --   --   AST 17  --   --   --   ALT 13*  --   --   --   ALKPHOS 84  --   --   --   BILITOT 0.7  --   --   --   GFRNONAA 15* 16* 15* 15*  GFRAA 18* 18* 17* 17*  ANIONGAP 12 6 8  13     Hematology Recent Labs  Lab 05/02/17 1431 05/04/17 0231  WBC 7.8 6.7  RBC 3.33* 3.83*  HGB 9.9* 11.6*  HCT 30.1* 34.7*  MCV 90.4 90.6  MCH 29.7 30.3  MCHC 32.9 33.4  RDW 12.6 12.9  PLT 178 210    Cardiac Enzymes Recent Labs  Lab 05/02/17 1431 05/02/17 1750 05/02/17 2103 05/03/17 0853  TROPONINI 0.22* 0.39* 0.43* 0.23*   No results for input(s): TROPIPOC in the last 168 hours.   BNP Recent Labs  Lab 05/02/17 1431  BNP 1,633.3*     DDimer No results for input(s): DDIMER in the last 168 hours.   Radiology    Dg Chest 2 View  Result Date: 05/02/2017 CLINICAL DATA:  Severe shortness of breath since yesterday EXAM: CHEST - 2 VIEW COMPARISON:  12/24/2016 FINDINGS: Enlargement of cardiac silhouette with pulmonary vascular congestion. Atherosclerotic calcifications aorta. Question mild LEFT perihilar infiltrate. Remaining lungs clear. No pleural effusion or pneumothorax. Scattered degenerative changes and scoliosis of the thoracic spine. IMPRESSION: Enlargement of cardiac silhouette with pulmonary vascular congestion. Question mild LEFT perihilar  infiltrate. Electronically Signed   By: Lavonia Dana M.D.   On: 05/02/2017 15:19    Cardiac Studies   Echocardiogram 05/03/17: Study Conclusions  - Procedure narrative: Transthoracic echocardiography. Image   quality was suboptimal. The study was technically difficult, as a   result of poor sound wave transmission and body habitus.   Intravenous contrast (Definity) was administered. - Left ventricle: The cavity size was normal. Wall thickness was   increased in a pattern of moderate LVH. Systolic function was   normal. The estimated ejection fraction was in the range of 60%   to 65%. Doppler parameters are consistent with abnormal left   ventricular relaxation (grade 1 diastolic dysfunction). The E/e&'   ratio is >15, suggesting elevated LV filling pressure. - Left atrium: The atrium was normal in size. - Pericardium, extracardiac: A trivial pericardial effusion was   identified posterior to the heart.  Impressions:  - Techically difficult study. Definity contrast given. LVEF 60-65%,   moderate LVH, grade 1 DD, elevated LV filling pressure, trivial   posterior pericardial effusion.  Patient Profile     NALA KACHEL is a 72 y.o. female with a PMH of HTN, HLD, DM type 2, seizure disorder, CKD stage IV, and obesity who is being followed by cardiology for acute on chronic diastolic CHF  Assessment & Plan    1. Acute respiratory failure in the setting of acute on chronic diastolic CHF: p/w SOB x2 days. CXR with pulmonary vascular congestion. BNP >1600. EKG without ischemic changes. Echocardiogram with EF 60-65% and G1DD. Net -1.0L output; net +11mL in the last 24 hours despite increased IV lasix BID. Cr 3.02 today; baseline ~2.8. Weight 205lb > 242lb today - Continue diuresis with IV lasix today - anticipate she can likely transition to po tomorrow - Continue to monitor electrolytes, strict I&Os, and daily weights - Not on a BBlocker due to prior issues with bradycardia  - Unable  to add ACEi/ARB due to CKD  2. Elevated troponin: patient is without complaints of CP. Trop trend 0.22>0.43>0.23. EKG without ischemic changes. Suspect elevation is 2/2 demand ischemia in the setting of acute CHF.  - Can obtain a NST outpatient   3. HTN: BP remains elevated despite restarting amlodipine yesterday - Continue amlodipine - Can consider increasing hydralazine dose  4. HLD: no recent lipids on file - Continue vytroin   5.  DM type 2: HgbA1C 5.5; at goal of <7 - Continue management per primary team  6. CKD stage IV: Cr at baseline - Continue to monitor closely with diuresis  7. Hyperkalemia: K 5.2 today; suspect due to renal insufficiency.  - Continue to monitor closely with diuresis   8. 1st degree AV block: has had intermittently on previous EKGs. Not on AV nodal blocking agents. - Continue to monitor on telemetry  9. Obesity: sedentary lifestyle and financial difficulties driving food choices.  - Continue to encourage activity and heart healthy diet - Nutrition consult    For questions or updates, please contact [place] Please consult [email or web address] for contact info under Cardiology/STEMI.      Signed, Abigail Butts, PA-C  05/04/2017, 8:17 AM   [phone]  Personally seen and examined. Agree with above.  Feels a bit better, less SOB. Sitting on edge of bed.  Exam: Alert, no JVD, obese, lungs clear, RRR, no LE edema  Labs: personally viewed. Creat 3  A/P:  Acute on chronic diastolic HF  - EF 99%. Continue with IV lasix. Weight 242 from 250. Maybe PO tomorrow.   - Tolerating creat of 3  CKD 4  - stable.   Morbid obesity  - encourage weight loss. [place] of issues.   Candee Furbish, MD

## 2017-05-04 NOTE — Progress Notes (Signed)
Patient ID: [name], female   DOB: 1945-06-28, 72 y.o.   MRN: [id number]  PROGRESS NOTE    [name]  BTD:[id number] DOB: [birth year] DOA: 05/02/2017 PCP: Benito Mccreedy, MD   Brief Narrative:  72 year old female with history of chronic kidney disease stage IV, hypertension, diabetes, seizure disorder presented with worsening shortness of breath on 05/02/2017.  She was admitted with acute hypoxic restaurant failure in the setting of CHF exacerbation.  Cardiology was consulted.  She was started on intravenous Lasix.   Assessment & Plan:   Principal Problem:   Acute respiratory failure with hypoxia (HCC) Active Problems:   Acute diastolic heart failure (HCC)   Seizures disorder   HTN (hypertension)   Diabetes mellitus type 2, insulin dependent ([place])   Morbid obesity with BMI of 45.0-49.9, adult (HCC)   Anemia   CKD (chronic kidney disease) stage 4, GFR 15-29 ml/min (HCC)   Dyslipidemia   Acute hyperkalemia   1st degree AV block  Acute hypoxic respiratory failure secondary to CHF exacerbation -Improving.  Currently on nasal cannula at 2 L/min.  Wean off as able -Diuretic plan as below.  Acute on chronic diastolic CHF -Continue intravenous Lasix.  Strict input and output.  Daily weights.  Negative balance of 1017.3 cc since admission. -2D echo EF of 60-65% with rate 1 diastolic dysfunction -Cardiology following.  Follow further recommendations.  Continue hydralazine -Monitor renal function while on IV diuretics -Avoid ACE inhibitor or ARB due to renal function -Not on beta-blocker due to prior issues with bradycardia  Elevated troponin -No complaints of chest pain.  Troponins did not trend up. -Probably secondary to demand ischemia.  Cardiology following  Essential hypertension -Continue Lasix, hydralazine and amlodipine.  Monitor blood pressure.  Diabetes mellitus, type II -Hemoglobin A1c 5.5 -Discontinue glipizide.  Continue 70/30 insulin, and insulin sliding  scale and CBG monitoring  CKD, stage IV -Creatinine currently stable.  Monitor while on IV diuretics  Hyperkalemia -Mild.  Monitor  Seizure disorder -Stable -continue Tegretol  Morbid obesity -weight control and interventions to be discussed with her primary care physician  Dyslipidemia -Continue vytorin  Anemia of chronic disease -Appears to be stable, continue to monitor CBC  First-degree AV block -Heart rate stable.  Continue monitoring.  Cardiology following  Noncompliance -Admits to noncompliance with medications, diet as well as doctor's appointments.  Case management as well as social work consulted to help arrange transportation, etc.  Generalized deconditioning -PT recommends nursing home placement.  Social worker consulted    DVT prophylaxis: Heparin subcutaneous Code Status: Full Family Communication: None at bedside Disposition Plan: Nursing home in 1-2 days  Consultants: Cardiology  Procedures:  Echo on 05/03/2017 Study Conclusions  - Procedure narrative: Transthoracic echocardiography. Image   quality was suboptimal. The study was technically difficult, as a   result of poor sound wave transmission and body habitus.   Intravenous contrast (Definity) was administered. - Left ventricle: The cavity size was normal. Wall thickness was   increased in a pattern of moderate LVH. Systolic function was   normal. The estimated ejection fraction was in the range of 60%   to 65%. Doppler parameters are consistent with abnormal left   ventricular relaxation (grade 1 diastolic dysfunction). The E/e&'   ratio is >15, suggesting elevated LV filling pressure. - Left atrium: The atrium was normal in size. - Pericardium, extracardiac: A trivial pericardial effusion was   identified posterior to the heart.  Impressions:  - Techically difficult study. Definity contrast given.  LVEF 60-65%,   moderate LVH, grade 1 DD, elevated LV filling pressure,  trivial   posterior pericardial effusion.  Antimicrobials: None   Subjective: Patient seen and examined at bedside.  She feels slightly better.  No overnight fever, nausea or vomiting.  Still short of breath with exertion  Objective: Vitals:   05/04/17 0100 05/04/17 0200 05/04/17 0523 05/04/17 0915  BP: (!) 190/65 (!) 162/54  (!) 143/74  Pulse: 65 63  73  Resp:    (!) 24  Temp:    98 F (36.7 C)  TempSrc:    Oral  SpO2:    97%  Weight:   110.1 kg (242 lb 12.8 oz)   Height:        Intake/Output Summary (Last 24 hours) at 05/04/2017 0920 Last data filed at 05/04/2017 0524 Gross per 24 hour  Intake 1216 ml  Output 1260 ml  Net -44 ml   Filed Weights   05/02/17 2026 05/03/17 0445 05/04/17 0523  Weight: 112.8 kg (248 lb 9.6 oz) 112.9 kg (249 lb) 110.1 kg (242 lb 12.8 oz)    Examination:  General exam: Appears calm and comfortable  Respiratory system: Bilateral decreased breath sound at bases with basilar crackles Cardiovascular system: S1 & S2 heard, rate controlled  gastrointestinal system: Abdomen is nondistended, soft and nontender. Normal bowel sounds heard. Central nervous system: Alert and oriented. No focal neurological deficits. Moving extremities Extremities: No cyanosis, clubbing; trace edema  skin: No rashes, lesions or ulcers Psychiatry: Judgement and insight appear normal. Mood & affect appropriate.     Data Reviewed: I have personally reviewed following labs and imaging studies  CBC: Recent Labs  Lab 05/02/17 1431 05/04/17 0231  WBC 7.8 6.7  NEUTROABS 6.9  --   HGB 9.9* 11.6*  HCT 30.1* 34.7*  MCV 90.4 90.6  PLT 178 756   Basic Metabolic Panel: Recent Labs  Lab 05/02/17 1431 05/02/17 1836 05/03/17 0237 05/04/17 0231  NA 139 137 139 140  K 5.9* 5.8* 5.1 5.2*  CL 110 111 112* 104  CO2 17* 20* 19* 23  GLUCOSE 165* 169* 117* 71  BUN 43* 44* 46* 49*  CREATININE 2.89* 2.88* 2.95* 3.02*  CALCIUM 8.7* 8.6* 8.8* 9.0   GFR: Estimated  Creatinine Clearance: 20.3 mL/min (A) (by C-G formula based on SCr of 3.02 mg/dL (H)). Liver Function Tests: Recent Labs  Lab 05/02/17 1431  AST 17  ALT 13*  ALKPHOS 84  BILITOT 0.7  PROT 6.9  ALBUMIN 3.6   No results for input(s): LIPASE, AMYLASE in the last 168 hours. No results for input(s): AMMONIA in the last 168 hours. Coagulation Profile: No results for input(s): INR, PROTIME in the last 168 hours. Cardiac Enzymes: Recent Labs  Lab 05/02/17 1431 05/02/17 1750 05/02/17 2103 05/03/17 0853  TROPONINI 0.22* 0.39* 0.43* 0.23*   BNP (last 3 results) No results for input(s): PROBNP in the last 8760 hours. HbA1C: Recent Labs    05/02/17 2103  HGBA1C 5.5   CBG: Recent Labs  Lab 05/03/17 1717 05/03/17 2037 05/04/17 0505 05/04/17 0520 05/04/17 0747  GLUCAP 115* 213* 65 85 162*   Lipid Profile: No results for input(s): CHOL, HDL, LDLCALC, TRIG, CHOLHDL, LDLDIRECT in the last 72 hours. Thyroid Function Tests: No results for input(s): TSH, T4TOTAL, FREET4, T3FREE, THYROIDAB in the last 72 hours. Anemia Panel: No results for input(s): VITAMINB12, FOLATE, FERRITIN, TIBC, IRON, RETICCTPCT in the last 72 hours. Sepsis Labs: No results for input(s): PROCALCITON, LATICACIDVEN in the last  168 hours.  No results found for this or any previous visit (from the past 240 hour(s)).       Radiology Studies: Dg Chest 2 View  Result Date: 05/02/2017 CLINICAL DATA:  Severe shortness of breath since yesterday EXAM: CHEST - 2 VIEW COMPARISON:  12/24/2016 FINDINGS: Enlargement of cardiac silhouette with pulmonary vascular congestion. Atherosclerotic calcifications aorta. Question mild LEFT perihilar infiltrate. Remaining lungs clear. No pleural effusion or pneumothorax. Scattered degenerative changes and scoliosis of the thoracic spine. IMPRESSION: Enlargement of cardiac silhouette with pulmonary vascular congestion. Question mild LEFT perihilar infiltrate. Electronically Signed    By: Lavonia Dana M.D.   On: 05/02/2017 15:19        Scheduled Meds: . amLODipine  10 mg Oral Daily  . aspirin  81 mg Oral Daily  . carbamazepine  400 mg Oral BID  . citalopram  20 mg Oral Daily  . ezetimibe-simvastatin  1 tablet Oral Daily  . furosemide  80 mg Intravenous BID  . glipiZIDE  5 mg Oral Q breakfast  . heparin  5,000 Units Subcutaneous Q8H  . hydrALAZINE  25 mg Oral TID  . insulin aspart  0-5 Units Subcutaneous QHS  . insulin aspart  0-9 Units Subcutaneous TID WC  . insulin aspart protamine- aspart  10 Units Subcutaneous BID  . mouth rinse  15 mL Mouth Rinse BID  . pantoprazole  40 mg Oral Daily  . sodium chloride flush  3 mL Intravenous Q12H   Continuous Infusions: . sodium chloride 250 mL (05/02/17 2208)     LOS: 2 days        Aline August, MD [facility] Pager [phone]  If 7PM-7AM, please contact night-coverage [email or web address] Password [email or web address] 05/04/2017, 9:20 AM

## 2017-05-04 NOTE — Progress Notes (Signed)
Hypoglycemic Event  CBG: 65  Treatment: 4 oz juice    Symptoms: none   Follow-up CBG: Time:5:20 CBG Result:85  Possible Reasons for Event:  Comments/MD notified:    Lester Kinsman

## 2017-05-04 NOTE — Plan of Care (Signed)
Nutrition Consult/Education Note  RD consulted for nutrition education regarding new onset CHF.  RD provided "Heart Failure Nutrition Therapy" handout from the Academy of [employer]. Reviewed patient's dietary recall. Provided examples on ways to decrease sodium intake in diet. Discouraged intake of processed foods and use of salt shaker. Encouraged fresh fruits and vegetables as well as whole grain sources of carbohydrates to maximize fiber intake.   RD discussed why it is important for patient to adhere to diet recommendations, and emphasized the role of fluids, foods to avoid, and importance of weighing self daily. Teach back method used.  Expect fair compliance.  Body mass index is 44.41 kg/m. Pt meets criteria for Obesity Class III based on current BMI.  Current diet order is Heart Healthy, patient is consuming approximately 100% of meals at this time. Labs and medications reviewed. No further nutrition interventions warranted at this time.  If additional nutrition issues arise, please re-consult RD.   Arthur Holms, RD, LDN Pager #: [phone] After-Hours Pager #: [phone]

## 2017-05-04 NOTE — NC FL2 (Signed)
[place] LEVEL OF CARE SCREENING TOOL     IDENTIFICATION  Patient Name: [name] Birthdate: 07-07-1945 Sex: female Admission Date (Current Location): 05/02/2017  [facility] and [place] Number:  Herbalist and Address:  [facility], [place] [address] [place], [place] [address]      Provider Number: [id number]  Attending Physician Name and Address:  Aline August, MD  Relative Name and Phone Number:       Current Level of Care: Hospital Recommended Level of Care: [place] Prior Approval Number:    Date Approved/Denied:   PASRR Number: [id number]  Discharge Plan: SNF    Current Diagnoses: Patient Active Problem List   Diagnosis Date Noted  . Acute respiratory failure with hypoxia ([place]) 05/02/2017  . Acute diastolic heart failure ([place]) 05/02/2017  . Seizures disorder 05/02/2017  . HTN (hypertension) 05/02/2017  . Diabetes mellitus type 2, insulin dependent ([place]) 05/02/2017  . Morbid obesity with BMI of 45.0-49.9, adult ([place]) 05/02/2017  . Anemia 05/02/2017  . CKD (chronic kidney disease) stage 4, GFR 15-29 ml/min (HCC) 05/02/2017  . Dyslipidemia 05/02/2017  . Acute hyperkalemia 05/02/2017  . 1st degree AV block 05/02/2017  . Symptomatic bradycardia 11/27/2016  . DVT (deep venous thrombosis) ([place]) 06/30/2015  . Chronic diastolic (congestive) heart failure ([place]) 06/30/2015  . Bradycardia with 31-40 beats per minute   . Emesis   . Junctional bradycardia   . Arterial embolism and thrombosis of upper extremity ([place])   . Bradycardia 12/11/2014  . Vomiting 12/11/2014  . Acute renal failure ([place]) 12/11/2014  . CKD (chronic kidney disease), stage IV ([place])   . Essential hypertension   . Type II diabetes mellitus ([place])   . OSA (obstructive sleep apnea) 10/17/2012  . Elevated troponin 09/26/2012  . Acute encephalopathy 09/25/2012  . Tremor 09/25/2012  . Rhabdomyolysis 09/25/2012  . Morbid obesity ([place])  09/16/2006  . DEPRESSION, CHRONIC 09/16/2006  . Seizure ([place]) 09/16/2006  . Hyperlipidemia 09/09/2006  . HYPERTENSION, BENIGN 09/09/2006  . CONSTIPATION, HX OF 09/09/2006    Orientation RESPIRATION BLADDER Height & Weight     Self, Time, Situation, Place  O2(2L [place]) Incontinent, External catheter Weight: 242 lb 12.8 oz (110.1 kg) Height:  5\' 2"  (157.5 cm)  BEHAVIORAL SYMPTOMS/MOOD NEUROLOGICAL BOWEL NUTRITION STATUS      Continent Diet(cardiac; carb modified)  AMBULATORY STATUS COMMUNICATION OF NEEDS Skin   Limited Assist Verbally Normal                       Personal Care Assistance Level of Assistance  Bathing, Dressing Bathing Assistance: Limited assistance   Dressing Assistance: Limited assistance     Functional Limitations Info             SPECIAL CARE FACTORS FREQUENCY  PT (By licensed PT), OT (By licensed OT)     PT Frequency: 5/wk OT Frequency: 5/wk            Contractures      Additional Factors Info  Code Status, Allergies, Insulin Sliding Scale Code Status Info: FULL Allergies Info: Codeine, Metoprolol   Insulin Sliding Scale Info: 6/day       Current Medications (05/04/2017):  This is the current hospital active medication list Current Facility-Administered Medications  Medication Dose Route Frequency Provider Last Rate Last Dose  . 0.9 %  sodium chloride infusion  250 mL Intravenous PRN Samella Parr, NP 10 mL/hr at 05/02/17 2208 250 mL  at 05/02/17 2208  . acetaminophen (TYLENOL) tablet 650 mg  650 mg Oral Q4H PRN Samella Parr, NP      . alum & mag hydroxide-simeth (MAALOX/MYLANTA) 200-200-20 MG/5ML suspension 30 mL  30 mL Oral Q2H PRN Cristal Ford, DO   30 mL at 05/03/17 2055  . amLODipine (NORVASC) tablet 10 mg  10 mg Oral Daily Jerline Pain, MD   10 mg at 05/04/17 0837  . aspirin chewable tablet 81 mg  81 mg Oral Daily Samella Parr, NP   81 mg at 05/04/17 0836  . carbamazepine (TEGRETOL) tablet 400 mg  400 mg Oral BID  Samella Parr, NP   400 mg at 05/04/17 5993  . citalopram (CELEXA) tablet 20 mg  20 mg Oral Daily Samella Parr, NP   20 mg at 05/04/17 0837  . ezetimibe-simvastatin (VYTORIN) 10-40 MG per tablet 1 tablet  1 tablet Oral Daily Samella Parr, NP   1 tablet at 05/04/17 0838  . furosemide (LASIX) injection 80 mg  80 mg Intravenous BID Jerline Pain, MD   80 mg at 05/04/17 0839  . glipiZIDE (GLUCOTROL XL) 24 hr tablet 5 mg  5 mg Oral Q breakfast Samella Parr, NP   5 mg at 05/04/17 0838  . heparin injection 5,000 Units  5,000 Units Subcutaneous Q8H Samella Parr, NP   5,000 Units at 05/04/17 5701  . hydrALAZINE (APRESOLINE) tablet 25 mg  25 mg Oral TID Samella Parr, NP   25 mg at 05/04/17 0837  . insulin aspart (novoLOG) injection 0-5 Units  0-5 Units Subcutaneous QHS Samella Parr, NP   2 Units at 05/03/17 2109  . insulin aspart (novoLOG) injection 0-9 Units  0-9 Units Subcutaneous TID WC Samella Parr, NP   2 Units at 05/04/17 [id number]  . insulin aspart protamine- aspart (NOVOLOG MIX 70/30) injection 10 Units  10 Units Subcutaneous BID Samella Parr, NP   10 Units at 05/03/17 2100  . loperamide (IMODIUM) capsule 2 mg  2 mg Oral PRN Cristal Ford, DO   2 mg at 05/03/17 2100  . MEDLINE mouth rinse  15 mL Mouth Rinse BID Cristal Ford, DO   15 mL at 05/04/17 0840  . ondansetron (ZOFRAN) injection 4 mg  4 mg Intravenous Q6H PRN Samella Parr, NP      . pantoprazole (PROTONIX) EC tablet 40 mg  40 mg Oral Daily Samella Parr, NP   40 mg at 05/04/17 0836  . sodium chloride flush (NS) 0.9 % injection 3 mL  3 mL Intravenous Q12H Samella Parr, NP   3 mL at 05/04/17 0839  . sodium chloride flush (NS) 0.9 % injection 3 mL  3 mL Intravenous PRN Samella Parr, NP         Discharge Medications: Please see discharge summary for a list of discharge medications.  Relevant Imaging Results:  Relevant Lab Results:   Additional Information SS# [id number]  Jorge Ny, LCSW

## 2017-05-05 DIAGNOSIS — J810 Acute pulmonary edema: Secondary | ICD-10-CM

## 2017-05-05 LAB — CBC WITH DIFFERENTIAL/PLATELET
BASOS ABS: 0 10*3/uL (ref 0.0–0.1)
BASOS PCT: 0 %
EOS ABS: 0 10*3/uL (ref 0.0–0.7)
EOS PCT: 0 %
HCT: 32.8 % — ABNORMAL LOW (ref 36.0–46.0)
Hemoglobin: 11 g/dL — ABNORMAL LOW (ref 12.0–15.0)
Lymphocytes Relative: 23 %
Lymphs Abs: 1.7 10*3/uL (ref 0.7–4.0)
MCH: 30 pg (ref 26.0–34.0)
MCHC: 33.5 g/dL (ref 30.0–36.0)
MCV: 89.4 fL (ref 78.0–100.0)
Monocytes Absolute: 0.7 10*3/uL (ref 0.1–1.0)
Monocytes Relative: 10 %
Neutro Abs: 4.9 10*3/uL (ref 1.7–7.7)
Neutrophils Relative %: 67 %
PLATELETS: 201 10*3/uL (ref 150–400)
RBC: 3.67 MIL/uL — ABNORMAL LOW (ref 3.87–5.11)
RDW: 12.6 % (ref 11.5–15.5)
WBC: 7.3 10*3/uL (ref 4.0–10.5)

## 2017-05-05 LAB — BASIC METABOLIC PANEL
Anion gap: 13 (ref 5–15)
BUN: 51 mg/dL — AB (ref 6–20)
CALCIUM: 8.4 mg/dL — AB (ref 8.9–10.3)
CO2: 21 mmol/L — ABNORMAL LOW (ref 22–32)
CREATININE: 3.12 mg/dL — AB (ref 0.44–1.00)
Chloride: 103 mmol/L (ref 101–111)
GFR, EST AFRICAN AMERICAN: 16 mL/min — AB (ref >60)
GFR, EST NON AFRICAN AMERICAN: 14 mL/min — AB (ref >60)
Glucose, Bld: 56 mg/dL — ABNORMAL LOW (ref 65–99)
Potassium: 4.7 mmol/L (ref 3.5–5.1)
SODIUM: 137 mmol/L (ref 135–145)

## 2017-05-05 LAB — GLUCOSE, CAPILLARY
GLUCOSE-CAPILLARY: 61 mg/dL — AB (ref 65–99)
GLUCOSE-CAPILLARY: 74 mg/dL (ref 65–99)
Glucose-Capillary: 172 mg/dL — ABNORMAL HIGH (ref 65–99)
Glucose-Capillary: 142 mg/dL — ABNORMAL HIGH (ref 65–99)
Glucose-Capillary: 162 mg/dL — ABNORMAL HIGH (ref 65–99)
Glucose-Capillary: 159 mg/dL — ABNORMAL HIGH (ref 65–99)

## 2017-05-05 LAB — MAGNESIUM: MAGNESIUM: 1.8 mg/dL (ref 1.7–2.4)

## 2017-05-05 MED ORDER — FUROSEMIDE 40 MG PO TABS
40.0000 mg | ORAL_TABLET | Freq: Every day | ORAL | Status: DC
  Administered 2017-05-05 – 2017-05-06 (×2): 40 mg via ORAL
  Filled 2017-05-05 (×3): qty 1

## 2017-05-05 MED ORDER — INSULIN ASPART PROT & ASPART (70-30 MIX) 100 UNIT/ML ~~LOC~~ SUSP
5.0000 [IU] | Freq: Two times a day (BID) | SUBCUTANEOUS | Status: DC
  Administered 2017-05-05 – 2017-05-06 (×3): 5 [IU] via SUBCUTANEOUS
  Filled 2017-05-05: qty 10

## 2017-05-05 NOTE — Progress Notes (Signed)
Progress Note  Patient Name: [name] Date of Encounter: 05/05/2017  Primary Cardiologist: Lauree Chandler, MD   Subjective   Feeling better.  Less short of breath.  No chest pain.  Sitting in chair.  Inpatient Medications    Scheduled Meds: . amLODipine  10 mg Oral Daily  . aspirin  81 mg Oral Daily  . carbamazepine  400 mg Oral BID  . citalopram  20 mg Oral Daily  . ezetimibe-simvastatin  1 tablet Oral Daily  . furosemide  80 mg Intravenous BID  . heparin  5,000 Units Subcutaneous Q8H  . hydrALAZINE  25 mg Oral TID  . insulin aspart  0-5 Units Subcutaneous QHS  . insulin aspart  0-9 Units Subcutaneous TID WC  . insulin aspart protamine- aspart  10 Units Subcutaneous BID  . mouth rinse  15 mL Mouth Rinse BID  . pantoprazole  40 mg Oral Daily  . sodium chloride flush  3 mL Intravenous Q12H   Continuous Infusions: . sodium chloride 250 mL (05/02/17 2208)   PRN Meds: sodium chloride, acetaminophen, alum & mag hydroxide-simeth, loperamide, ondansetron (ZOFRAN) IV, sodium chloride flush   Vital Signs    Vitals:   05/04/17 0915 05/04/17 1942 05/05/17 0412 05/05/17 0836  BP: (!) 143/74 (!) 151/60 (!) 127/92 (!) 141/66  Pulse: 73 69 68 73  Resp: (!) 24 (!) 22 20 19   Temp: 98 F (36.7 C) 97.8 F (36.6 C) 98.7 F (37.1 C) (!) 97.5 F (36.4 C)  TempSrc: Oral Oral Oral Oral  SpO2: 97% 97% 94% 97%  Weight:   241 lb 6.5 oz (109.5 kg)   Height:        Intake/Output Summary (Last 24 hours) at 05/05/2017 1105 Last data filed at 05/05/2017 0848 Gross per 24 hour  Intake 880 ml  Output 2200 ml  Net -1320 ml   Filed Weights   05/03/17 0445 05/04/17 0523 05/05/17 0412  Weight: 249 lb (112.9 kg) 242 lb 12.8 oz (110.1 kg) 241 lb 6.5 oz (109.5 kg)    Telemetry    No adverse arrhythmias- Personally Reviewed  ECG    No new EKG- Personally Reviewed  Physical Exam   GEN: No acute distress.  Overweight Neck: No JVD Cardiac: RRR, no murmurs, rubs, or  gallops.  Respiratory: Clear to auscultation bilaterally. GI: Soft, nontender, non-distended  MS: No edema; No deformity. Neuro:  Nonfocal  Psych: Normal affect   Labs    Chemistry Recent Labs  Lab 05/02/17 1431  05/03/17 0237 05/04/17 0231 05/05/17 0343  NA 139   < > 139 140 137  K 5.9*   < > 5.1 5.2* 4.7  CL 110   < > 112* 104 103  CO2 17*   < > 19* 23 21*  GLUCOSE 165*   < > 117* 71 56*  BUN 43*   < > 46* 49* 51*  CREATININE 2.89*   < > 2.95* 3.02* 3.12*  CALCIUM 8.7*   < > 8.8* 9.0 8.4*  PROT 6.9  --   --   --   --   ALBUMIN 3.6  --   --   --   --   AST 17  --   --   --   --   ALT 13*  --   --   --   --   ALKPHOS 84  --   --   --   --   BILITOT 0.7  --   --   --   --  GFRNONAA 15*   < > 15* 15* 14*  GFRAA 18*   < > 17* 17* 16*  ANIONGAP 12   < > 8 13 13    < > = values in this interval not displayed.     Hematology Recent Labs  Lab 05/02/17 1431 05/04/17 0231 05/05/17 0343  WBC 7.8 6.7 7.3  RBC 3.33* 3.83* 3.67*  HGB 9.9* 11.6* 11.0*  HCT 30.1* 34.7* 32.8*  MCV 90.4 90.6 89.4  MCH 29.7 30.3 30.0  MCHC 32.9 33.4 33.5  RDW 12.6 12.9 12.6  PLT 178 210 201    Cardiac Enzymes Recent Labs  Lab 05/02/17 1431 05/02/17 1750 05/02/17 2103 05/03/17 0853  TROPONINI 0.22* 0.39* 0.43* 0.23*   No results for input(s): TROPIPOC in the last 168 hours.   BNP Recent Labs  Lab 05/02/17 1431  BNP 1,633.3*     DDimer No results for input(s): DDIMER in the last 168 hours.   Radiology    No results found.  Cardiac Studies   Echocardiogram 05/03/17-EF 60%.  Elevated left atrial filling pressures.  Patient Profile     72 y.o. female with morbid obesity, acute on chronic diastolic heart failure  Assessment & Plan    Acute on chronic diastolic heart failure/respiratory failure - We will transition to p.o. Lasix today, 40 mg daily.  Her creatinine has now increased to 3.1. - No ACE inhibitor or angiotensin receptor blocker because of renal function. -She  will need to maintain daily weights at home and take additional Lasix if she increases 2-3 pounds.  This will help to avoid readmission.  Her current weight is 241 pounds.  She was 249 pounds on admission.  Elevated troponin -Demand ischemia in the setting of heart failure.  Continue to treat underlying condition.  Low level flat troponin.  Not acute coronary syndrome.  Essential hypertension -Blood pressure a little bit better controlled with volume status control.  Morbid obesity -Continue to encourage weight loss.  This is likely at the crux of many of her issues.  Chronic kidney disease stage IV -Willing to tolerate creatinine 3.1.  Okay to consider discharge with close follow-up.  We will schedule outpatient appointment in 1-2 weeks.  Check basic metabolic profile then.  We will sign off.  Please call if you have any other questions.  For questions or updates, please contact [place] Please consult [email or web address] for contact info under Cardiology/STEMI.      Signed, Candee Furbish, MD  05/05/2017, 11:05 AM

## 2017-05-05 NOTE — Progress Notes (Signed)
Patient ID: [name], female   DOB: [birth year], 72 y.o.   MRN: [id number]  PROGRESS NOTE    SHANTELLE ALLES  XTG:[id number] DOB: [birth year] DOA: 05/02/2017 PCP: Benito Mccreedy, MD   Brief Narrative:  72 year old female with history of chronic kidney disease stage IV, hypertension, diabetes, seizure disorder presented with worsening shortness of breath on 05/02/2017.  She was admitted with acute hypoxic restaurant failure in the setting of CHF exacerbation.  Cardiology was consulted.  She was started on intravenous Lasix.   Assessment & Plan:   Principal Problem:   Acute respiratory failure with hypoxia (HCC) Active Problems:   Acute diastolic heart failure (HCC)   Seizures disorder   HTN (hypertension)   Diabetes mellitus type 2, insulin dependent ([place])   Morbid obesity with BMI of 45.0-49.9, adult (HCC)   Anemia   CKD (chronic kidney disease) stage 4, GFR 15-29 ml/min (HCC)   Dyslipidemia   Acute hyperkalemia   1st degree AV block  Acute hypoxic respiratory failure secondary to CHF exacerbation -Resolved.  Currently on room air -Diuretic plan as below.  Acute on chronic diastolic CHF -Currently on intravenous Lasix.  Strict input and output.  Daily weights.  Negative balance of 2137.3 cc since admission.  Will probably switch Lasix to oral today per cardiology. -2D echo EF of 60-65% with rate 1 diastolic dysfunction -Cardiology following.  Follow further recommendations.  Continue hydralazine -Monitor renal function while on IV diuretics -Avoid ACE inhibitor or ARB due to renal function -Not on beta-blocker due to prior issues with bradycardia  Elevated troponin -No complaints of chest pain.  Troponins did not trend up. -Probably secondary to demand ischemia.  Cardiology following  Essential hypertension -Continue Lasix, hydralazine and amlodipine.  Monitor blood pressure.  Diabetes mellitus, type II -Hemoglobin A1c 5.5 -Discontinue glipizide.  Continue 70/30  insulin, and insulin sliding scale and CBG monitoring  CKD, stage IV -Creatinine slightly elevated compared to yesterday.  Monitor while on IV diuretics.  Repeat a.m. labs.  Hyperkalemia -Resolved.  Seizure disorder -Stable -continue Tegretol  Morbid obesity -weight control and interventions to be discussed with her primary care physician  Dyslipidemia -Continue vytorin  Anemia of chronic disease -Appears to be stable, continue to monitor CBC  First-degree AV block -Heart rate stable.  Continue monitoring.  Cardiology following  Noncompliance -Admits to noncompliance with medications, diet as well as doctor's appointments.  Case management as well as social work consulted to help arrange transportation, etc.  Generalized deconditioning -PT recommends nursing home placement.  Social worker consulted    DVT prophylaxis: Heparin subcutaneous Code Status: Full Family Communication: None at bedside Disposition Plan: Nursing home in 1-2 days once cleared by cardiology.  Consultants: Cardiology  Procedures:  Echo on 05/03/2017 Study Conclusions  - Procedure narrative: Transthoracic echocardiography. Image   quality was suboptimal. The study was technically difficult, as a   result of poor sound wave transmission and body habitus.   Intravenous contrast (Definity) was administered. - Left ventricle: The cavity size was normal. Wall thickness was   increased in a pattern of moderate LVH. Systolic function was   normal. The estimated ejection fraction was in the range of 60%   to 65%. Doppler parameters are consistent with abnormal left   ventricular relaxation (grade 1 diastolic dysfunction). The E/e&'   ratio is >15, suggesting elevated LV filling pressure. - Left atrium: The atrium was normal in size. - Pericardium, extracardiac: A trivial pericardial effusion was   identified posterior to  the heart.  Impressions:  - Techically difficult study. Definity  contrast given. LVEF 60-65%,   moderate LVH, grade 1 DD, elevated LV filling pressure, trivial   posterior pericardial effusion.  Antimicrobials: None   Subjective: Patient seen and examined at bedside.  No overnight fever, nausea, vomiting, worsening shortness of breath or chest pain.  She feels slightly better. Objective: Vitals:   05/04/17 0915 05/04/17 1942 05/05/17 0412 05/05/17 0836  BP: (!) 143/74 (!) 151/60 (!) 127/92 (!) 141/66  Pulse: 73 69 68 73  Resp: (!) 24 (!) 22 20 19   Temp: 98 F (36.7 C) 97.8 F (36.6 C) 98.7 F (37.1 C) (!) 97.5 F (36.4 C)  TempSrc: Oral Oral Oral Oral  SpO2: 97% 97% 94% 97%  Weight:   109.5 kg (241 lb 6.5 oz)   Height:        Intake/Output Summary (Last 24 hours) at 05/05/2017 0947 Last data filed at 05/05/2017 0848 Gross per 24 hour  Intake 1080 ml  Output 2200 ml  Net -1120 ml   Filed Weights   05/03/17 0445 05/04/17 0523 05/05/17 0412  Weight: 112.9 kg (249 lb) 110.1 kg (242 lb 12.8 oz) 109.5 kg (241 lb 6.5 oz)    Examination:  General exam: Appears calm and comfortable.  No distress Respiratory system: Bilateral decreased breath sounds at bases with basilar crackles. Cardiovascular system: Rate controlled, S1-S2 heard gastrointestinal system: Abdomen is nondistended, soft and nontender. Normal bowel sounds heard. Extremities: No cyanosis; trace edema      Data Reviewed: I have personally reviewed following labs and imaging studies  CBC: Recent Labs  Lab 05/02/17 1431 05/04/17 0231 05/05/17 0343  WBC 7.8 6.7 7.3  NEUTROABS 6.9  --  4.9  HGB 9.9* 11.6* 11.0*  HCT 30.1* 34.7* 32.8*  MCV 90.4 90.6 89.4  PLT 178 210 676   Basic Metabolic Panel: Recent Labs  Lab 05/02/17 1431 05/02/17 1836 05/03/17 0237 05/04/17 0231 05/05/17 0343  NA 139 137 139 140 137  K 5.9* 5.8* 5.1 5.2* 4.7  CL 110 111 112* 104 103  CO2 17* 20* 19* 23 21*  GLUCOSE 165* 169* 117* 71 56*  BUN 43* 44* 46* 49* 51*  CREATININE 2.89* 2.88*  2.95* 3.02* 3.12*  CALCIUM 8.7* 8.6* 8.8* 9.0 8.4*  MG  --   --   --   --  1.8   GFR: Estimated Creatinine Clearance: 19.6 mL/min (A) (by C-G formula based on SCr of 3.12 mg/dL (H)). Liver Function Tests: Recent Labs  Lab 05/02/17 1431  AST 17  ALT 13*  ALKPHOS 84  BILITOT 0.7  PROT 6.9  ALBUMIN 3.6   No results for input(s): LIPASE, AMYLASE in the last 168 hours. No results for input(s): AMMONIA in the last 168 hours. Coagulation Profile: No results for input(s): INR, PROTIME in the last 168 hours. Cardiac Enzymes: Recent Labs  Lab 05/02/17 1431 05/02/17 1750 05/02/17 2103 05/03/17 0853  TROPONINI 0.22* 0.39* 0.43* 0.23*   BNP (last 3 results) No results for input(s): PROBNP in the last 8760 hours. HbA1C: Recent Labs    05/02/17 2103  HGBA1C 5.5   CBG: Recent Labs  Lab 05/04/17 1715 05/04/17 2043 05/05/17 0400 05/05/17 0403 05/05/17 0749  GLUCAP 92 175* 61* 74 172*   Lipid Profile: No results for input(s): CHOL, HDL, LDLCALC, TRIG, CHOLHDL, LDLDIRECT in the last 72 hours. Thyroid Function Tests: No results for input(s): TSH, T4TOTAL, FREET4, T3FREE, THYROIDAB in the last 72 hours. Anemia Panel: No  results for input(s): VITAMINB12, FOLATE, FERRITIN, TIBC, IRON, RETICCTPCT in the last 72 hours. Sepsis Labs: No results for input(s): PROCALCITON, LATICACIDVEN in the last 168 hours.  No results found for this or any previous visit (from the past 240 hour(s)).       Radiology Studies: No results found.      Scheduled Meds: . amLODipine  10 mg Oral Daily  . aspirin  81 mg Oral Daily  . carbamazepine  400 mg Oral BID  . citalopram  20 mg Oral Daily  . ezetimibe-simvastatin  1 tablet Oral Daily  . furosemide  80 mg Intravenous BID  . heparin  5,000 Units Subcutaneous Q8H  . hydrALAZINE  25 mg Oral TID  . insulin aspart  0-5 Units Subcutaneous QHS  . insulin aspart  0-9 Units Subcutaneous TID WC  . insulin aspart protamine- aspart  10 Units  Subcutaneous BID  . mouth rinse  15 mL Mouth Rinse BID  . pantoprazole  40 mg Oral Daily  . sodium chloride flush  3 mL Intravenous Q12H   Continuous Infusions: . sodium chloride 250 mL (05/02/17 2208)     LOS: 3 days        Aline August, MD [facility] Pager [phone]  If 7PM-7AM, please contact night-coverage [email or web address] Password [facility] 05/05/2017, 9:47 AM

## 2017-05-05 NOTE — Progress Notes (Signed)
CSW provided pt with list of offers- would like [facility] first choice- per MD likely ready tomorrow- facility informed. CSW encouraged pt to consider other options in case [facility] does not have bed available  Jorge Ny, [place] Social Worker [phone]

## 2017-05-05 NOTE — Progress Notes (Signed)
Physical Therapy Treatment Patient Details Name: [name] MRN: [id number] DOB: 1945-11-30 Today's Date: 05/05/2017    History of Present Illness Pt is a 72 y/o female with onset of mild troponin elevation, acute CHF and respiratory failure was admitted for SOB and weakness, noted to have demand ischemia.  PMHx:  thoracic scoliosis, CKD 4, obesity, CHF, HTN, DVT, seizures, DM    PT Comments    Pt making steady progress with functional mobility. Pt would continue to benefit from skilled physical therapy services at this time while admitted and after d/c to address the below listed limitations in order to improve overall safety and independence with functional mobility.    Follow Up Recommendations  SNF     Equipment Recommendations  None recommended by PT    Recommendations for Other Services       Precautions / Restrictions Precautions Precautions: Fall Restrictions Weight Bearing Restrictions: No    Mobility  Bed Mobility               General bed mobility comments: pt sitting EOB upon arrival  Transfers Overall transfer level: Needs assistance Equipment used: Rolling walker (2 wheeled) Transfers: Sit to/from Stand Sit to Stand: Min assist         General transfer comment: min A for stability with rise into standing from EOB  Ambulation/Gait Ambulation/Gait assistance: Min guard Ambulation Distance (Feet): 15 Feet Assistive device: Rolling walker (2 wheeled) Gait Pattern/deviations: Step-through pattern;Decreased stride length Gait velocity: decreased Gait velocity interpretation: Below normal speed for age/gender General Gait Details: pt steady with RW, limited in distance secondary to fatigue    Stairs            Wheelchair Mobility    Modified Rankin (Stroke Patients Only)       Balance Overall balance assessment: Needs assistance Sitting-balance support: Feet supported Sitting balance-Leahy Scale: Good     Standing balance  support: Bilateral upper extremity supported;During functional activity Standing balance-Leahy Scale: Poor                              Cognition Arousal/Alertness: Awake/alert Behavior During Therapy: WFL for tasks assessed/performed Overall Cognitive Status: Within Functional Limits for tasks assessed                                        Exercises      General Comments        Pertinent Vitals/Pain Pain Assessment: No/denies pain    Home Living                      Prior Function            PT Goals (current goals can now be found in the care plan section) Acute Rehab PT Goals PT Goal Formulation: With patient Time For Goal Achievement: 05/17/17 Potential to Achieve Goals: Good Progress towards PT goals: Progressing toward goals    Frequency    Min 2X/week      PT Plan Current plan remains appropriate    Co-evaluation              AM-PAC PT "6 Clicks" Daily Activity  Outcome Measure  Difficulty turning over in bed (including adjusting bedclothes, sheets and blankets)?: A Little Difficulty moving from lying on back to sitting on the side of the bed? :  A Little Difficulty sitting down on and standing up from a chair with arms (e.g., wheelchair, bedside commode, etc,.)?: Unable Help needed moving to and from a bed to chair (including a wheelchair)?: A Little Help needed walking in hospital room?: A Little Help needed climbing 3-5 steps with a railing? : A Lot 6 Click Score: 15    End of Session Equipment Utilized During Treatment: Gait belt Activity Tolerance: Patient tolerated treatment well Patient left: in chair;with call bell/phone within reach;Other (comment)(RN and nurse tech present) Nurse Communication: Mobility status PT Visit Diagnosis: Unsteadiness on feet (R26.81);Muscle weakness (generalized) (M62.81);Adult, failure to thrive (R62.7)     Time: 0914-0930 PT Time Calculation (min) (ACUTE ONLY): 16  min  Charges:  $Therapeutic Activity: 8-22 mins                    G Codes:       [place], [place], [place] 05/05/2017, 1:05 PM

## 2017-05-05 NOTE — Progress Notes (Signed)
Inpatient Diabetes Program Recommendations  AACE/ADA: New Consensus Statement on Inpatient Glycemic Control (2015)  Target Ranges:  Prepandial:   less than 140 mg/dL      Peak postprandial:   less than 180 mg/dL (1-2 hours)      Critically ill patients:  140 - 180 mg/dL   Lab Results  Component Value Date   GLUCAP 172 (H) 05/05/2017   HGBA1C 5.5 05/02/2017    Review of Glycemic Control Results for [name], [name] (MRN [id number]) as of 05/05/2017 11:59  Ref. Range 05/04/2017 17:15 05/04/2017 20:43 05/05/2017 04:00 05/05/2017 04:03 05/05/2017 07:49  Glucose-Capillary Latest Ref Range: 65 - 99 mg/dL 92 175 (H) 61 (L) 74 172 (H)   Diabetes history: DM2 Outpatient Diabetes medications: Humulin 70/30 insulin mix 10 units bid + Glucotrol 5 mg Current orders for Inpatient glycemic control: Novolog 70/30 insulin mix 10 units bid + Novolog sensitive correction tid  Inpatient Diabetes Program Recommendations:   While in the hospital, decrease 70/30 insulin to 5 units bid Consider discontinue Glucotrol on discharge due to A1c 5.5.  Thank you, [name]. Paula Bice, RN, MSN, CDE  Diabetes Coordinator Inpatient Glycemic Control Team Team Pager [phone] (8am-5pm) 05/05/2017 12:03 PM

## 2017-05-06 ENCOUNTER — Encounter: Payer: Self-pay | Admitting: Cardiovascular Disease

## 2017-05-06 DIAGNOSIS — D631 Anemia in chronic kidney disease: Secondary | ICD-10-CM | POA: Diagnosis not present

## 2017-05-06 DIAGNOSIS — J810 Acute pulmonary edema: Secondary | ICD-10-CM | POA: Diagnosis not present

## 2017-05-06 DIAGNOSIS — I10 Essential (primary) hypertension: Secondary | ICD-10-CM | POA: Diagnosis not present

## 2017-05-06 DIAGNOSIS — E785 Hyperlipidemia, unspecified: Secondary | ICD-10-CM | POA: Diagnosis not present

## 2017-05-06 DIAGNOSIS — I509 Heart failure, unspecified: Secondary | ICD-10-CM | POA: Diagnosis not present

## 2017-05-06 DIAGNOSIS — M6281 Muscle weakness (generalized): Secondary | ICD-10-CM | POA: Diagnosis not present

## 2017-05-06 DIAGNOSIS — J9601 Acute respiratory failure with hypoxia: Secondary | ICD-10-CM | POA: Diagnosis not present

## 2017-05-06 DIAGNOSIS — I5031 Acute diastolic (congestive) heart failure: Secondary | ICD-10-CM | POA: Diagnosis not present

## 2017-05-06 DIAGNOSIS — I5030 Unspecified diastolic (congestive) heart failure: Secondary | ICD-10-CM | POA: Diagnosis not present

## 2017-05-06 DIAGNOSIS — I5033 Acute on chronic diastolic (congestive) heart failure: Secondary | ICD-10-CM | POA: Diagnosis not present

## 2017-05-06 DIAGNOSIS — K219 Gastro-esophageal reflux disease without esophagitis: Secondary | ICD-10-CM | POA: Diagnosis not present

## 2017-05-06 DIAGNOSIS — I4891 Unspecified atrial fibrillation: Secondary | ICD-10-CM | POA: Diagnosis not present

## 2017-05-06 DIAGNOSIS — R7989 Other specified abnormal findings of blood chemistry: Secondary | ICD-10-CM | POA: Diagnosis not present

## 2017-05-06 DIAGNOSIS — N189 Chronic kidney disease, unspecified: Secondary | ICD-10-CM | POA: Diagnosis not present

## 2017-05-06 DIAGNOSIS — G4733 Obstructive sleep apnea (adult) (pediatric): Secondary | ICD-10-CM | POA: Diagnosis not present

## 2017-05-06 DIAGNOSIS — J9600 Acute respiratory failure, unspecified whether with hypoxia or hypercapnia: Secondary | ICD-10-CM | POA: Diagnosis not present

## 2017-05-06 DIAGNOSIS — E1122 Type 2 diabetes mellitus with diabetic chronic kidney disease: Secondary | ICD-10-CM | POA: Diagnosis not present

## 2017-05-06 DIAGNOSIS — E875 Hyperkalemia: Secondary | ICD-10-CM | POA: Diagnosis not present

## 2017-05-06 DIAGNOSIS — I440 Atrioventricular block, first degree: Secondary | ICD-10-CM | POA: Diagnosis not present

## 2017-05-06 DIAGNOSIS — N390 Urinary tract infection, site not specified: Secondary | ICD-10-CM | POA: Diagnosis not present

## 2017-05-06 DIAGNOSIS — N184 Chronic kidney disease, stage 4 (severe): Secondary | ICD-10-CM | POA: Diagnosis not present

## 2017-05-06 DIAGNOSIS — E119 Type 2 diabetes mellitus without complications: Secondary | ICD-10-CM | POA: Diagnosis not present

## 2017-05-06 LAB — BASIC METABOLIC PANEL
Anion gap: 13 (ref 5–15)
BUN: 59 mg/dL — AB (ref 6–20)
CALCIUM: 8.4 mg/dL — AB (ref 8.9–10.3)
CHLORIDE: 99 mmol/L — AB (ref 101–111)
CO2: 23 mmol/L (ref 22–32)
CREATININE: 3.27 mg/dL — AB (ref 0.44–1.00)
GFR calc non Af Amer: 13 mL/min — ABNORMAL LOW (ref >60)
GFR, EST AFRICAN AMERICAN: 15 mL/min — AB (ref >60)
Glucose, Bld: 86 mg/dL (ref 65–99)
Potassium: 4.6 mmol/L (ref 3.5–5.1)
SODIUM: 135 mmol/L (ref 135–145)

## 2017-05-06 LAB — GLUCOSE, CAPILLARY
GLUCOSE-CAPILLARY: 201 mg/dL — AB (ref 65–99)
Glucose-Capillary: 120 mg/dL — ABNORMAL HIGH (ref 65–99)
Glucose-Capillary: 218 mg/dL — ABNORMAL HIGH (ref 65–99)

## 2017-05-06 LAB — MAGNESIUM: MAGNESIUM: 1.8 mg/dL (ref 1.7–2.4)

## 2017-05-06 MED ORDER — BISACODYL 5 MG PO TBEC: 10.0000 mg | DELAYED_RELEASE_TABLET | Freq: Every day | ORAL | Status: DC | PRN

## 2017-05-06 MED ORDER — HYDRALAZINE HCL 25 MG PO TABS: 25.0000 mg | ORAL_TABLET | Freq: Three times a day (TID) | ORAL | Status: DC

## 2017-05-06 MED ORDER — FUROSEMIDE 80 MG PO TABS: 40.0000 mg | ORAL_TABLET | Freq: Every day | ORAL | Status: DC

## 2017-05-06 MED ORDER — HUMULIN 70/30 KWIKPEN (70-30) 100 UNIT/ML ~~LOC~~ SUPN: 5.0000 [IU] | PEN_INJECTOR | Freq: Two times a day (BID) | SUBCUTANEOUS | Status: AC

## 2017-05-06 NOTE — Clinical Social Work Placement (Signed)
   CLINICAL SOCIAL WORK PLACEMENT  NOTE  Date:  05/06/2017  Patient Details  Name: [name] MRN: [id number] Date of Birth: [birth year]  Clinical Social Work is seeking post-discharge placement for this patient at the [place] level of care (*CSW will initial, date and re-position this form in  chart as items are completed):  Yes   Patient/family provided with [place] Work Department's list of facilities offering this level of care within the geographic area requested by the patient (or if unable, by the patient's family).  Yes   Patient/family informed of their freedom to choose among providers that offer the needed level of care, that participate in Medicare, Medicaid or managed care program needed by the patient, have an available bed and are willing to accept the patient.  Yes   Patient/family informed of [facility]'s ownership interest in [facility] and [facility], as well as of the fact that they are under no obligation to receive care at these facilities.  PASRR submitted to EDS on       PASRR number received on       Existing PASRR number confirmed on 05/04/17     FL2 transmitted to all facilities in geographic area requested by pt/family on 05/04/17     FL2 transmitted to all facilities within larger geographic area on       Patient informed that his/her managed care company has contracts with or will negotiate with certain facilities, including the following:        Yes   Patient/family informed of bed offers received.  Patient chooses bed at [facility]     Physician recommends and patient chooses bed at      Patient to be transferred to [facility] on 05/06/17.  Patient to be transferred to facility by ptar     Patient family notified on 05/06/17 of transfer.  Name of family member notified:  kathy     PHYSICIAN Please sign FL2     Additional Comment:     _______________________________________________ Jorge Ny, LCSW 05/06/2017, 2:22 PM

## 2017-05-06 NOTE — Progress Notes (Signed)
Patient will discharge to [facility] Anticipated discharge date: 4/12 Family notified: pt dtr Transportation by PTAR- scheduled for 5pm  CSW signing off.  Jorge Ny, LCSW Clinical Social Worker [phone]

## 2017-05-06 NOTE — Progress Notes (Signed)
Patient has bed at [facility] today which is preferred facility- room won't be ready till after 4pm -will set up PTAR once facility is ready  Jorge Ny, [place] Worker [phone]

## 2017-05-06 NOTE — Discharge Summary (Signed)
Physician Discharge Summary  [name] GHW:[id number] DOB: 1945-09-23 DOA: 05/02/2017  PCP: Benito Mccreedy, MD  Admit date: 05/02/2017 Discharge date: 05/06/2017  Admitted From: Home Disposition:  SNF  Recommendations for Outpatient Follow-up:  1. Follow up with provider at nursing home at earliest convenience with repeat BMP  Follow-up with cardiology in 1-2 weeks   Home Health: No Equipment/Devices: None  Discharge Condition: Stable CODE STATUS: Full Diet recommendation: Heart Healthy / Carb Modified   Brief/Interim Summary: 72 year old female with history of chronic kidney disease stage IV, hypertension, diabetes, seizure disorder presented with worsening shortness of breath on 05/02/2017.  She was admitted with acute hypoxic restaurant failure in the setting of CHF exacerbation.  Cardiology was consulted.  She was started on intravenous Lasix.  Her respiratory status improved.  She was changed to oral Lasix by cardiology.  Cardiology recommended outpatient follow-up and cleared the patient for discharge.  She will be discharged to nursing home once bed is available as per PT recommendations.     Discharge Diagnoses:  Principal Problem:   Acute respiratory failure with hypoxia (HCC) Active Problems:   Acute diastolic heart failure (HCC)   Seizures disorder   HTN (hypertension)   Diabetes mellitus type 2, insulin dependent ([place])   Morbid obesity with BMI of 45.0-49.9, adult (HCC)   Anemia   CKD (chronic kidney disease) stage 4, GFR 15-29 ml/min (HCC)   Dyslipidemia   Acute hyperkalemia   1st degree AV block  Acute hypoxic respiratory failure secondary to CHF exacerbation -Resolved.  Currently on room air -Diuretic plan as below.  Acute on chronic diastolic CHF -Started on intravenous Lasix.  Cardiology was consulted.  Lasix has been changed to oral 40 mg daily by cardiology.  Cardiology recommends outpatient follow-up and has cleared the patient for discharge.  Negative balance of 4131.3 cc since admission.    Monitor renal function as an outpatient. -2D echo EF of 60-65% with rate 1 diastolic dysfunction -Continue hydralazine -Avoid ACE inhibitor or ARB due to renal function -Not on beta-blocker due to prior issues with bradycardia  Elevated troponin -No complaints of chest pain.  Troponins did not trend up. -Probably secondary to demand ischemia.  Essentialhypertension -Continue Lasix, hydralazine and amlodipine.  Monitor blood pressure.  Diabetes mellitus, type II -Hemoglobin A1c 5.5 -Discontinue glipizide.  Continue 70/30 insulin  CKD, stage IV -Creatinine slightly elevated compared to yesterday.   Repeat labs in the nursing home.  Patient will benefit from outpatient follow-up with nephrology.  Hyperkalemia -Resolved.  Seizure disorder -Stable -continue Tegretol  Morbid obesity -weight control and interventions to be discussed with her primary care physician  Dyslipidemia -Continue vytorin  Anemia of chronic disease -Appears to be stable, continue to monitor CBC  First-degree AV block -Heart rate stable.  Continue monitoring.    Outpatient follow-up with cardiology.  Noncompliance -Admits to noncompliance with medications, diet as well as doctor's appointments. Case management as well as social work consulted to help arrange transportation,etc.  Generalized deconditioning -PT recommends nursing home placement.    Continue PT in the nursing home.   Discharge Instructions  Discharge Instructions    (HEART FAILURE PATIENTS) Call MD:  Anytime you have any of the following symptoms: 1) 3 pound weight gain in 24 hours or 5 pounds in 1 week 2) shortness of breath, with or without a dry hacking cough 3) swelling in the hands, feet or stomach 4) if you have to sleep on extra pillows at night in order to breathe.  Complete by:  As directed    Ambulatory referral to Cardiology   Complete by:  As directed    CHF  followup   Call MD for:  difficulty breathing, headache or visual disturbances   Complete by:  As directed    Call MD for:  extreme fatigue   Complete by:  As directed    Call MD for:  hives   Complete by:  As directed    Call MD for:  persistant dizziness or light-headedness   Complete by:  As directed    Call MD for:  persistant nausea and vomiting   Complete by:  As directed    Call MD for:  severe uncontrolled pain   Complete by:  As directed    Call MD for:  temperature >100.4   Complete by:  As directed    Diet - low sodium heart healthy   Complete by:  As directed    Diet Carb Modified   Complete by:  As directed    Increase activity slowly   Complete by:  As directed      Allergies as of 05/06/2017      Reactions   Codeine Nausea And Vomiting   Metoprolol Other (See Comments)   Severe bradycardia.  Now getting admitted for 3rd time in 3 years for bradycardia onset after being started on toprol.  Please dont put patient on this med anymore!      Medication List    STOP taking these medications   glipiZIDE 5 MG 24 hr tablet Commonly known as:  GLUCOTROL XL     TAKE these medications   amLODipine 10 MG tablet Commonly known as:  NORVASC Take 10 mg by mouth daily.   aspirin 81 MG chewable tablet Chew 1 tablet (81 mg total) by mouth daily.   bisacodyl 5 MG EC tablet Commonly known as:  DULCOLAX Take 2 tablets (10 mg total) by mouth daily as needed.   carbamazepine 200 MG tablet Commonly known as:  TEGRETOL Take 400 mg by mouth 2 (two) times daily.   citalopram 20 MG tablet Commonly known as:  CELEXA Take 20 mg by mouth daily.   ezetimibe-simvastatin 10-40 MG tablet Commonly known as:  VYTORIN Take 1 tablet by mouth daily.   furosemide 80 MG tablet Commonly known as:  LASIX Take 0.5 tablets (40 mg total) by mouth daily.   HUMULIN 70/30 KWIKPEN (70-30) 100 UNIT/ML PEN Generic drug:  Insulin Isophane & Regular Human Inject 5 Units into the skin 2  (two) times daily. What changed:  how much to take   hydrALAZINE 25 MG tablet Commonly known as:  APRESOLINE Take 1 tablet (25 mg total) by mouth 3 (three) times daily.   omeprazole 40 MG capsule Commonly known as:  PRILOSEC Take 40 mg by mouth daily.   VITAMIN C PO Take 1 tablet by mouth daily.   Vitamin D-3 1000 units Caps Take 1,000 Units by mouth daily at 12 noon.      Follow-up Information    [name], Iona Beard, MD. Schedule an appointment as soon as possible for a visit in 1 week(s).   Specialty:  Internal Medicine Why:  with repeat BMP Contact information: [address] [place] [address] [phone]        Burnell Blanks, MD .   Specialty:  Cardiology Contact information: [place]. [address] [place] [address] [phone]          Allergies  Allergen Reactions  . Codeine Nausea And Vomiting  . Metoprolol Other (See Comments)    Severe bradycardia.  Now getting admitted for 3rd time in 3 years for bradycardia onset after being started on toprol.  Please dont put patient on this med anymore!    Consultations:  Cardiology   Procedures/Studies: Dg Chest 2 View  Result Date: 05/02/2017 CLINICAL DATA:  Severe shortness of breath since yesterday EXAM: CHEST - 2 VIEW COMPARISON:  12/24/2016 FINDINGS: Enlargement of cardiac silhouette with pulmonary vascular congestion. Atherosclerotic calcifications aorta. Question mild LEFT perihilar infiltrate. Remaining lungs clear. No pleural effusion or pneumothorax. Scattered degenerative changes and scoliosis of the thoracic spine. IMPRESSION: Enlargement of cardiac silhouette with pulmonary vascular congestion. Question mild LEFT perihilar infiltrate. Electronically Signed   By: Lavonia Dana M.D.   On: 05/02/2017 15:19    Echo on 05/03/2017 Study Conclusions  - Procedure narrative: Transthoracic echocardiography. Image quality was suboptimal. The study was technically  difficult, as a result of poor sound wave transmission and body habitus. Intravenous contrast (Definity) was administered. - Left ventricle: The cavity size was normal. Wall thickness was increased in a pattern of moderate LVH. Systolic function was normal. The estimated ejection fraction was in the range of 60% to 65%. Doppler parameters are consistent with abnormal left ventricular relaxation (grade 1 diastolic dysfunction). The E/e&' ratio is >15, suggesting elevated LV filling pressure. - Left atrium: The atrium was normal in size. - Pericardium, extracardiac: A trivial pericardial effusion was identified posterior to the heart.  Impressions:  - Techically difficult study. Definity contrast given. LVEF 60-65%, moderate LVH, grade 1 DD, elevated LV filling pressure, trivial posterior pericardial effusion.    Subjective: Patient seen and examined at bedside.  She denies overnight worsening shortness of breath or chest pain.  No overnight fever or vomiting.  Discharge Exam: Vitals:   05/06/17 0501 05/06/17 0738  BP: (!) 142/72 (!) 142/64  Pulse: (!) 57 67  Resp: 18 16  Temp: 98.2 F (36.8 C) 98.6 F (37 C)  SpO2: 96% 98%   Vitals:   05/05/17 1949 05/06/17 0407 05/06/17 0501 05/06/17 0738  BP: 130/64  (!) 142/72 (!) 142/64  Pulse: 60  (!) 57 67  Resp: 20  18 16   Temp: 98 F (36.7 C)  98.2 F (36.8 C) 98.6 F (37 C)  TempSrc: Oral  Oral Oral  SpO2: 96%  96% 98%  Weight:  110.7 kg (244 lb 0.8 oz)    Height:        General: Pt is alert, awake, not in acute distress Cardiovascular: Rate controlled, S1/S2 + Respiratory: Bilateral decreased breath sounds at bases Abdominal: Soft, NT, ND, bowel sounds + Extremities: Trace edema, no cyanosis    The results of significant diagnostics from this hospitalization (including imaging, microbiology, ancillary and laboratory) are listed below for reference.     Microbiology: No results found for  this or any previous visit (from the past 240 hour(s)).   Labs: BNP (last 3 results) Recent Labs    05/02/17 1431  BNP 0,093.8*   Basic Metabolic Panel: Recent Labs  Lab 05/02/17 1836 05/03/17 0237 05/04/17 0231 05/05/17 0343 05/06/17 0333  NA 137 139 140 137 135  K 5.8* 5.1 5.2* 4.7 4.6  CL 111 112* 104 103 99*  CO2 20* 19* 23 21* 23  GLUCOSE 169* 117* 71 56* 86  BUN 44* 46* 49* 51* 59*  CREATININE 2.88* 2.95* 3.02* 3.12* 3.27*  CALCIUM 8.6* 8.8* 9.0 8.4* 8.4*  MG  --   --   --  1.8 1.8   Liver Function Tests: Recent Labs  Lab 05/02/17 1431  AST 17  ALT 13*  ALKPHOS 84  BILITOT 0.7  PROT 6.9  ALBUMIN 3.6   No results for input(s): LIPASE, AMYLASE in the last 168 hours. No results for input(s): AMMONIA in the last 168 hours. CBC: Recent Labs  Lab 05/02/17 1431 05/04/17 0231 05/05/17 0343  WBC 7.8 6.7 7.3  NEUTROABS 6.9  --  4.9  HGB 9.9* 11.6* 11.0*  HCT 30.1* 34.7* 32.8*  MCV 90.4 90.6 89.4  PLT 178 210 201   Cardiac Enzymes: Recent Labs  Lab 05/02/17 1431 05/02/17 1750 05/02/17 2103 05/03/17 0853  TROPONINI 0.22* 0.39* 0.43* 0.23*   BNP: Invalid input(s): POCBNP CBG: Recent Labs  Lab 05/05/17 0749 05/05/17 1201 05/05/17 1642 05/05/17 2040 05/06/17 0737  GLUCAP 172* 142* 162* 159* 201*   D-Dimer No results for input(s): DDIMER in the last 72 hours. Hgb A1c No results for input(s): HGBA1C in the last 72 hours. Lipid Profile No results for input(s): CHOL, HDL, LDLCALC, TRIG, CHOLHDL, LDLDIRECT in the last 72 hours. Thyroid function studies No results for input(s): TSH, T4TOTAL, T3FREE, THYROIDAB in the last 72 hours.  Invalid input(s): FREET3 Anemia work up No results for input(s): VITAMINB12, FOLATE, FERRITIN, TIBC, IRON, RETICCTPCT in the last 72 hours. Urinalysis    Component Value Date/Time   COLORURINE YELLOW 06/30/2015 0939   APPEARANCEUR CLEAR 06/30/2015 0939   LABSPEC 1.015 06/30/2015 0939   PHURINE 5.5 06/30/2015 0939    GLUCOSEU NEGATIVE 06/30/2015 0939   HGBUR NEGATIVE 06/30/2015 0939   BILIRUBINUR NEGATIVE 06/30/2015 0939   KETONESUR NEGATIVE 06/30/2015 0939   PROTEINUR 100 (A) 06/30/2015 0939   UROBILINOGEN 0.2 11/14/2014 1946   NITRITE NEGATIVE 06/30/2015 0939   LEUKOCYTESUR NEGATIVE 06/30/2015 0939   Sepsis Labs Invalid input(s): PROCALCITONIN,  WBC,  LACTICIDVEN Microbiology No results found for this or any previous visit (from the past 240 hour(s)).   Time coordinating discharge: 35 minutes  SIGNED:   Aline August, MD  [facility] 05/06/2017, 9:42 AM Pager: [phone]  If 7PM-7AM, please contact night-coverage [email or web address] Password [email or web address]

## 2017-05-06 NOTE — Care Management Important Message (Signed)
Important Message  Patient Details  Name: [name] MRN: [id number] Date of Birth: 09-29-1945   Medicare Important Message Given:  Yes    Camie Hauss Montine Circle 05/06/2017, 1:53 PM

## 2017-05-10 DIAGNOSIS — M6281 Muscle weakness (generalized): Secondary | ICD-10-CM | POA: Diagnosis not present

## 2017-05-10 DIAGNOSIS — N184 Chronic kidney disease, stage 4 (severe): Secondary | ICD-10-CM | POA: Diagnosis not present

## 2017-05-10 DIAGNOSIS — I10 Essential (primary) hypertension: Secondary | ICD-10-CM | POA: Diagnosis not present

## 2017-05-10 DIAGNOSIS — I5030 Unspecified diastolic (congestive) heart failure: Secondary | ICD-10-CM | POA: Diagnosis not present

## 2017-05-10 DIAGNOSIS — E119 Type 2 diabetes mellitus without complications: Secondary | ICD-10-CM | POA: Diagnosis not present

## 2017-05-11 DIAGNOSIS — I509 Heart failure, unspecified: Secondary | ICD-10-CM | POA: Diagnosis not present

## 2017-05-11 DIAGNOSIS — N189 Chronic kidney disease, unspecified: Secondary | ICD-10-CM | POA: Diagnosis not present

## 2017-05-15 DIAGNOSIS — I10 Essential (primary) hypertension: Secondary | ICD-10-CM | POA: Diagnosis not present

## 2017-05-15 DIAGNOSIS — J9601 Acute respiratory failure with hypoxia: Secondary | ICD-10-CM | POA: Diagnosis not present

## 2017-05-15 DIAGNOSIS — E1122 Type 2 diabetes mellitus with diabetic chronic kidney disease: Secondary | ICD-10-CM | POA: Diagnosis not present

## 2017-05-15 DIAGNOSIS — I5031 Acute diastolic (congestive) heart failure: Secondary | ICD-10-CM | POA: Diagnosis not present

## 2017-05-20 DIAGNOSIS — M6281 Muscle weakness (generalized): Secondary | ICD-10-CM | POA: Diagnosis not present

## 2017-05-20 DIAGNOSIS — E119 Type 2 diabetes mellitus without complications: Secondary | ICD-10-CM | POA: Diagnosis not present

## 2017-05-20 DIAGNOSIS — I5033 Acute on chronic diastolic (congestive) heart failure: Secondary | ICD-10-CM | POA: Diagnosis not present

## 2017-05-20 DIAGNOSIS — N184 Chronic kidney disease, stage 4 (severe): Secondary | ICD-10-CM | POA: Diagnosis not present

## 2017-05-20 DIAGNOSIS — I10 Essential (primary) hypertension: Secondary | ICD-10-CM | POA: Diagnosis not present

## 2017-05-23 ENCOUNTER — Ambulatory Visit: Payer: Medicare Other | Admitting: Physician Assistant

## 2017-05-23 NOTE — Progress Notes (Deleted)
Cardiology Office Note:    Date:  05/23/2017   ID:  [name], DOB [birth year], MRN [id number]  PCP:  Paula Mccreedy, MD  Cardiologist:  Paula Chandler, MD   Referring MD: Paula Mccreedy, MD   No chief complaint on file. ***  History of Present Illness:    [name] is a 72 y.o. female with diastolic heart failure, diabetes, stage IV chronic kidney disease, hypertension, hyperlipidemia, seizure disorder, anemia of chronic disease, bradycardia.  She underwent left brachial artery embolectomy secondary to ischemic left upper extremity November 2016 by Dr. Scot Martin.  She took coumadin for 3 mos.  She has been seen several times in the hospital by our service but has never followed up in the outpatient setting.  She was most recently admitted 4/8-4/12 with decompensated heart failure.  She did have minimally elevated troponins with flat trend felt to be secondary to demand ischemia.  Echocardiogram demonstrated normal LV function.  [name] ***  Prior CV studies:   The following studies were reviewed today:  Echocardiogram 05/03/2017 Moderate LVH, EF 40-98, grade 1 diastolic dysfunction, trivial pericardial effusion  Echocardiogram 12/11/2014 Moderate LVH, EF 55-60, normal wall motion, mild LAE  Past Medical History:  Diagnosis Date  . Anemia   . CHF (congestive heart failure) ([place])   . CKD (chronic kidney disease), stage IV ([place])   . Depression   . DVT (deep venous thrombosis) ([place])   . Essential hypertension   . GERD (gastroesophageal reflux disease)   . Hyperlipidemia   . Morbid obesity ([place])   . Seizures ([place])    "epilepsy; they think I had one a couple months ago" (12/11/2014)  . Type II diabetes mellitus ([place])    Surgical Hx: The patient  has a past surgical history that includes Tubal ligation and Thrombectomy brachial artery (Left, 12/15/2014).   Current Medications: No outpatient medications have been marked as taking for the 05/23/17 encounter  (Appointment) with [name] T, PA-C.     Allergies:   Codeine and Metoprolol   Social History   Tobacco Use  . Smoking status: Never Smoker  . Smokeless tobacco: Never Used  . Tobacco comment: EXPOSED TO 2ND HAND SMOKE X 20+ YEARS.   Substance Use Topics  . Alcohol use: No  . Drug use: No     Family Hx: The patient's family history includes Cancer in her maternal aunt; Hypertension in her father and mother. There is no history of Seizures.  ROS:   Please see the history of present illness.    ROS All other systems reviewed and are negative.   EKGs/Labs/Other Test Reviewed:    EKG:  EKG is *** ordered today.  The ekg ordered today demonstrates ***  Recent Labs: 11/30/2016: TSH 1.248 05/02/2017: ALT 13; B Natriuretic Peptide 1,633.3 05/05/2017: Hemoglobin 11.0; Platelets 201 05/06/2017: BUN 59; Creatinine, Ser 3.27; Magnesium 1.8; Potassium 4.6; Sodium 135   Recent Lipid Panel Lab Results  Component Value Date/Time   CHOL 139 04/03/2010 02:12 AM   TRIG 92 04/03/2010 02:12 AM   HDL 52 04/03/2010 02:12 AM   CHOLHDL 2.7 Ratio 04/03/2010 02:12 AM   LDLCALC 69 04/03/2010 02:12 AM    Physical Exam:    VS:  There were no vitals taken for this visit.    Wt Readings from Last 3 Encounters:  05/06/17 244 lb 0.8 oz (110.7 kg)  11/30/16 247 lb 4.8 oz (112.2 kg)  07/02/15 231 lb 3.2 oz (104.9 kg)     ***  Physical Exam  ASSESSMENT & PLAN:    Chronic diastolic (congestive) heart failure (HCC)  Essential hypertension  CKD (chronic kidney disease) stage 4, GFR 15-29 ml/min (HCC)  Diabetes mellitus type 2, insulin dependent (HCC)  Elevated troponin***  Dispo:  No follow-ups on file.   Medication Adjustments/Labs and Tests Ordered: Current medicines are reviewed at length with the patient today.  Concerns regarding medicines are outlined above.  Tests Ordered: No orders of the defined types were placed in this encounter.  Medication Changes: No orders of the  defined types were placed in this encounter.   Signed, Paula Dopp, PA-C  05/23/2017 1:48 PM    [facility] [place], [place], [place]  [address] Phone: [phone]; Fax: [phone]

## 2017-05-24 ENCOUNTER — Encounter: Payer: Self-pay | Admitting: Physician Assistant

## 2017-06-22 DIAGNOSIS — K219 Gastro-esophageal reflux disease without esophagitis: Secondary | ICD-10-CM | POA: Diagnosis not present

## 2017-06-22 DIAGNOSIS — Z0110 Encounter for examination of ears and hearing without abnormal findings: Secondary | ICD-10-CM | POA: Diagnosis not present

## 2017-06-22 DIAGNOSIS — Z0101 Encounter for examination of eyes and vision with abnormal findings: Secondary | ICD-10-CM | POA: Diagnosis not present

## 2017-06-22 DIAGNOSIS — E785 Hyperlipidemia, unspecified: Secondary | ICD-10-CM | POA: Diagnosis not present

## 2017-06-22 DIAGNOSIS — E1159 Type 2 diabetes mellitus with other circulatory complications: Secondary | ICD-10-CM | POA: Diagnosis not present

## 2017-06-22 DIAGNOSIS — I10 Essential (primary) hypertension: Secondary | ICD-10-CM | POA: Diagnosis not present

## 2017-06-22 DIAGNOSIS — Z0000 Encounter for general adult medical examination without abnormal findings: Secondary | ICD-10-CM | POA: Diagnosis not present

## 2017-07-11 ENCOUNTER — Ambulatory Visit: Payer: Medicare Other | Admitting: Physician Assistant

## 2017-07-11 NOTE — Progress Notes (Deleted)
Cardiology Office Note:    Date:  07/11/2017   ID:  [name], DOB November 24, 1945, MRN [id number]  PCP:  Benito Mccreedy, MD  Cardiologist:  No primary care provider on file.   Referring MD: Benito Mccreedy, MD   No chief complaint on file. ***  History of Present Illness:    [name] is a 72 y.o. female with diastolic heart failure, diabetes, hypertension, hyperlipidemia, chronic kidney disease, prior arterial embolism during an admission in 11/16 status post left brachial embolectomy (patient took Coumadin for 3 months), bradycardia (improved off of beta-blocker, clonidine).   She has been seen in the hospital several times by Cardiology but has never followed up as an outpatient.    She was admitted in April 2019 with hypoxic respiratory failure in the setting of decompensated heart failure.  She did have elevated troponin levels felt to represent demand ischemia.  Echocardiogram demonstrated normal LV function.  She was seen by Dr. Marlou Porch for Cardiology.     [name] ***  Prior CV studies:   The following studies were reviewed today:  Echo 05/03/17 Moderate LVH, EF 40-98, grade 1 diastolic dysfunction, trivial effusion  Renal artery [place] 12/18/2014 - Abnormal high resistance waveforms noted throughout the renal arteries bilaterally without evidence of a stenosis in the arteries that were clearly visualized. - Bilateral abnormal intrarenal resistive indices.  Echo 12/11/2014 Moderate LVH, EF 55-60, normal wall motion, mild LAE  Past Medical History:  Diagnosis Date  . 1st degree AV block 05/02/2017  . Acute encephalopathy 09/25/2012  . Acute hyperkalemia 05/02/2017  . Acute renal failure ([place]) 12/11/2014  . Acute respiratory failure with hypoxia ([place]) 05/02/2017  . Anemia   . Arterial embolism and thrombosis of upper extremity ([place])   . Bradycardia 12/11/2014  . Bradycardia with 31-40 beats per minute   . CHF (congestive heart failure) ([place])   . Chronic  diastolic (congestive) heart failure ([place]) 06/30/2015  . CKD (chronic kidney disease) stage 4, GFR 15-29 ml/min (HCC) 05/02/2017  . CKD (chronic kidney disease), stage IV ([place])   . Depression   . Diabetes mellitus type 2, insulin dependent ([place]) 05/02/2017  . DVT (deep venous thrombosis) ([place])   . Dyslipidemia 05/02/2017  . Elevated troponin 09/26/2012  . Essential hypertension   . GERD (gastroesophageal reflux disease)   . HTN (hypertension) 05/02/2017  . Hyperlipidemia   . Junctional bradycardia   . Morbid obesity ([place])   . Morbid obesity with BMI of 45.0-49.9, adult ([place]) 05/02/2017  . OSA (obstructive sleep apnea) 10/17/2012  . Rhabdomyolysis 09/25/2012  . Seizures ([place])    "epilepsy; they think I had one a couple months ago" (12/11/2014)  . Symptomatic bradycardia 11/27/2016  . Type II diabetes mellitus ([place])    Surgical Hx: The patient  has a past surgical history that includes Tubal ligation and Thrombectomy brachial artery (Left, 12/15/2014).   Current Medications: No outpatient medications have been marked as taking for the 07/11/17 encounter (Appointment) with Richardson Dopp T, PA-C.     Allergies:   Codeine and Metoprolol   Social History   Tobacco Use  . Smoking status: Never Smoker  . Smokeless tobacco: Never Used  . Tobacco comment: EXPOSED TO 2ND HAND SMOKE X 20+ YEARS.   Substance Use Topics  . Alcohol use: No  . Drug use: No     Family Hx: The patient's family history includes Cancer in her maternal aunt; Hypertension in her father and mother. There is no history of  Seizures.  ROS:   Please see the history of present illness.    ROS All other systems reviewed and are negative.   EKGs/Labs/Other Test Reviewed:    EKG:  EKG is *** ordered today.  The ekg ordered today demonstrates ***  Recent Labs: 11/30/2016: TSH 1.248 05/02/2017: ALT 13; B Natriuretic Peptide 1,633.3 05/05/2017: Hemoglobin 11.0; Platelets 201 05/06/2017: BUN 59; Creatinine, Ser 3.27; Magnesium 1.8;  Potassium 4.6; Sodium 135   Recent Lipid Panel Lab Results  Component Value Date/Time   CHOL 139 04/03/2010 02:12 AM   TRIG 92 04/03/2010 02:12 AM   HDL 52 04/03/2010 02:12 AM   CHOLHDL 2.7 Ratio 04/03/2010 02:12 AM   LDLCALC 69 04/03/2010 02:12 AM    Physical Exam:    VS:  There were no vitals taken for this visit.    Wt Readings from Last 3 Encounters:  05/06/17 244 lb 0.8 oz (110.7 kg)  11/30/16 247 lb 4.8 oz (112.2 kg)  07/02/15 231 lb 3.2 oz (104.9 kg)     ***Physical Exam  ASSESSMENT & PLAN:    No diagnosis found.***  Dispo:  No follow-ups on file.   Medication Adjustments/Labs and Tests Ordered: Current medicines are reviewed at length with the patient today.  Concerns regarding medicines are outlined above.  Tests Ordered: No orders of the defined types were placed in this encounter.  Medication Changes: No orders of the defined types were placed in this encounter.   Signed, Richardson Dopp, PA-C  07/11/2017 10:06 AM    [facility] [place], [place], [place]  [address] Phone: [phone]; Fax: [phone]

## 2017-07-12 ENCOUNTER — Encounter: Payer: Self-pay | Admitting: Physician Assistant

## 2017-07-25 DIAGNOSIS — E785 Hyperlipidemia, unspecified: Secondary | ICD-10-CM | POA: Diagnosis not present

## 2017-07-25 DIAGNOSIS — E1159 Type 2 diabetes mellitus with other circulatory complications: Secondary | ICD-10-CM | POA: Diagnosis not present

## 2017-07-25 DIAGNOSIS — Z0000 Encounter for general adult medical examination without abnormal findings: Secondary | ICD-10-CM | POA: Diagnosis not present

## 2017-07-25 DIAGNOSIS — I10 Essential (primary) hypertension: Secondary | ICD-10-CM | POA: Diagnosis not present

## 2017-07-26 ENCOUNTER — Encounter: Payer: Self-pay | Admitting: Podiatry

## 2017-07-26 ENCOUNTER — Ambulatory Visit (INDEPENDENT_AMBULATORY_CARE_PROVIDER_SITE_OTHER): Payer: Medicare Other | Admitting: Podiatry

## 2017-07-26 DIAGNOSIS — M79675 Pain in left toe(s): Secondary | ICD-10-CM

## 2017-07-26 DIAGNOSIS — M79674 Pain in right toe(s): Secondary | ICD-10-CM | POA: Diagnosis not present

## 2017-07-26 DIAGNOSIS — B351 Tinea unguium: Secondary | ICD-10-CM

## 2017-07-26 DIAGNOSIS — E119 Type 2 diabetes mellitus without complications: Secondary | ICD-10-CM | POA: Diagnosis not present

## 2017-07-26 NOTE — Progress Notes (Signed)
Patient ID: [name], female   DOB: 12/19/1945, 72 y.o.   MRN: [id number] Complaint:  Visit Type: Patient returns to my office for continued preventative foot care services. Complaint: Patient states" my nails have grown long and thick and become painful to walk and wear shoes" Patient has been diagnosed with DM with no complications. He presents for preventative foot care services. No changes to ROS  Podiatric Exam: Vascular: dorsalis pedis and posterior tibial pulses are palpable bilateral. Capillary return is immediate. Temperature gradient is WNL. Skin turgor WNL  Sensorium: Normal Semmes Weinstein monofilament test. Normal tactile sensation bilaterally. Nail Exam: Pt has thick disfigured discolored nails with subungual debris noted bilateral entire nail hallux through fifth toenails Ulcer Exam: There is no evidence of ulcer or pre-ulcerative changes or infection. Orthopedic Exam: Muscle tone and strength are WNL. No limitations in general ROM. No crepitus or effusions noted. Foot type and digits show no abnormalities. Bony prominences are unremarkable. Skin: No Porokeratosis. No infection or ulcers. Xerotic skin plantar B/L  Diagnosis:  Tinea unguium, Pain in right toe, pain in left toes  Treatment & Plan Procedures and Treatment: Consent by patient was obtained for treatment procedures. The patient understood the discussion of treatment and procedures well. All questions were answered thoroughly reviewed. Debridement of mycotic and hypertrophic toenails, 1 through 5 bilateral and clearing of subungual debris. No ulceration, no infection noted.  Return Visit-Office Procedure: Patient instructed to return to the office for a follow up visit 3 months for continued evaluation and treatment.   Gardiner Barefoot DPM

## 2017-08-26 DIAGNOSIS — E1159 Type 2 diabetes mellitus with other circulatory complications: Secondary | ICD-10-CM | POA: Diagnosis not present

## 2017-08-26 DIAGNOSIS — I10 Essential (primary) hypertension: Secondary | ICD-10-CM | POA: Diagnosis not present

## 2017-08-26 DIAGNOSIS — R569 Unspecified convulsions: Secondary | ICD-10-CM | POA: Diagnosis not present

## 2017-09-01 ENCOUNTER — Other Ambulatory Visit: Payer: Self-pay | Admitting: Physician Assistant

## 2017-09-01 DIAGNOSIS — Z1231 Encounter for screening mammogram for malignant neoplasm of breast: Secondary | ICD-10-CM

## 2017-09-21 DIAGNOSIS — K219 Gastro-esophageal reflux disease without esophagitis: Secondary | ICD-10-CM | POA: Diagnosis not present

## 2017-09-21 DIAGNOSIS — E1159 Type 2 diabetes mellitus with other circulatory complications: Secondary | ICD-10-CM | POA: Diagnosis not present

## 2017-09-21 DIAGNOSIS — R569 Unspecified convulsions: Secondary | ICD-10-CM | POA: Diagnosis not present

## 2017-09-21 DIAGNOSIS — I10 Essential (primary) hypertension: Secondary | ICD-10-CM | POA: Diagnosis not present

## 2017-09-27 DIAGNOSIS — E113393 Type 2 diabetes mellitus with moderate nonproliferative diabetic retinopathy without macular edema, bilateral: Secondary | ICD-10-CM | POA: Diagnosis not present

## 2017-09-28 DIAGNOSIS — H5213 Myopia, bilateral: Secondary | ICD-10-CM | POA: Diagnosis not present

## 2017-10-06 ENCOUNTER — Ambulatory Visit: Payer: Medicare Other

## 2017-10-25 ENCOUNTER — Ambulatory Visit: Payer: Medicare Other | Admitting: Podiatry

## 2017-10-26 DIAGNOSIS — H52223 Regular astigmatism, bilateral: Secondary | ICD-10-CM | POA: Diagnosis not present

## 2017-11-02 ENCOUNTER — Ambulatory Visit
Admission: RE | Admit: 2017-11-02 | Discharge: 2017-11-02 | Disposition: A | Discharge status: Home / Self Care | Payer: Medicare Other | Source: Ambulatory Visit | Attending: Physician Assistant | Admitting: Physician Assistant

## 2017-11-02 DIAGNOSIS — Z1231 Encounter for screening mammogram for malignant neoplasm of breast: Secondary | ICD-10-CM

## 2017-12-19 ENCOUNTER — Telehealth: Payer: Self-pay | Admitting: Podiatry

## 2017-12-19 NOTE — Telephone Encounter (Signed)
Returned call from voice message. Unable to reach patient, unable to leave voice mail.

## 2017-12-29 DIAGNOSIS — I10 Essential (primary) hypertension: Secondary | ICD-10-CM | POA: Diagnosis not present

## 2017-12-29 DIAGNOSIS — E785 Hyperlipidemia, unspecified: Secondary | ICD-10-CM | POA: Diagnosis not present

## 2017-12-29 DIAGNOSIS — Z23 Encounter for immunization: Secondary | ICD-10-CM | POA: Diagnosis not present

## 2017-12-29 DIAGNOSIS — E1159 Type 2 diabetes mellitus with other circulatory complications: Secondary | ICD-10-CM | POA: Diagnosis not present

## 2017-12-29 DIAGNOSIS — K219 Gastro-esophageal reflux disease without esophagitis: Secondary | ICD-10-CM | POA: Diagnosis not present

## 2018-01-20 ENCOUNTER — Encounter: Payer: Self-pay | Admitting: Podiatry

## 2018-01-20 ENCOUNTER — Ambulatory Visit (INDEPENDENT_AMBULATORY_CARE_PROVIDER_SITE_OTHER): Payer: Medicare Other | Admitting: Podiatry

## 2018-01-20 DIAGNOSIS — E119 Type 2 diabetes mellitus without complications: Secondary | ICD-10-CM

## 2018-01-20 DIAGNOSIS — B351 Tinea unguium: Secondary | ICD-10-CM | POA: Diagnosis not present

## 2018-01-20 DIAGNOSIS — M79675 Pain in left toe(s): Secondary | ICD-10-CM | POA: Diagnosis not present

## 2018-01-20 DIAGNOSIS — M79674 Pain in right toe(s): Secondary | ICD-10-CM | POA: Diagnosis not present

## 2018-01-20 NOTE — Progress Notes (Signed)
Patient ID: [name], female   DOB: 1945/01/31, 72 y.o.   MRN: [id number] Complaint:  Visit Type: Patient returns to my office for continued preventative foot care services. Complaint: Patient states" my nails have grown long and thick and become painful to walk and wear shoes" Patient has been diagnosed with DM with no complications. He presents for preventative foot care services. No changes to ROS  Podiatric Exam: Vascular: dorsalis pedis and posterior tibial pulses are palpable bilateral. Capillary return is immediate. Temperature gradient is WNL. Skin turgor WNL  Sensorium: Normal Semmes Weinstein monofilament test. Normal tactile sensation bilaterally. Nail Exam: Pt has thick disfigured discolored nails with subungual debris noted bilateral entire nail hallux through fifth toenails Ulcer Exam: There is no evidence of ulcer or pre-ulcerative changes or infection. Orthopedic Exam: Muscle tone and strength are WNL. No limitations in general ROM. No crepitus or effusions noted. Foot type and digits show no abnormalities. Bony prominences are unremarkable. Skin: No Porokeratosis. No infection or ulcers. Xerotic skin plantar B/L  Diagnosis:  Tinea unguium, Pain in right toe, pain in left toes  Treatment & Plan Procedures and Treatment: Consent by patient was obtained for treatment procedures. The patient understood the discussion of treatment and procedures well. All questions were answered thoroughly reviewed. Debridement of mycotic and hypertrophic toenails, 1 through 5 bilateral and clearing of subungual debris. No ulceration, no infection noted.  Return Visit-Office Procedure: Patient instructed to return to the office for a follow up visit 3 months for continued evaluation and treatment.   Gardiner Barefoot DPM

## 2018-04-06 DIAGNOSIS — K219 Gastro-esophageal reflux disease without esophagitis: Secondary | ICD-10-CM | POA: Diagnosis not present

## 2018-04-06 DIAGNOSIS — E1159 Type 2 diabetes mellitus with other circulatory complications: Secondary | ICD-10-CM | POA: Diagnosis not present

## 2018-04-06 DIAGNOSIS — E785 Hyperlipidemia, unspecified: Secondary | ICD-10-CM | POA: Diagnosis not present

## 2018-04-06 DIAGNOSIS — I10 Essential (primary) hypertension: Secondary | ICD-10-CM | POA: Diagnosis not present

## 2018-04-19 ENCOUNTER — Ambulatory Visit: Payer: Medicare Other | Admitting: Podiatry

## 2018-05-31 ENCOUNTER — Ambulatory Visit (INDEPENDENT_AMBULATORY_CARE_PROVIDER_SITE_OTHER): Payer: Medicare Other | Admitting: Podiatry

## 2018-05-31 ENCOUNTER — Other Ambulatory Visit: Payer: Self-pay

## 2018-05-31 ENCOUNTER — Encounter: Payer: Self-pay | Admitting: Podiatry

## 2018-05-31 VITALS — Temp 97.5°F

## 2018-05-31 DIAGNOSIS — M79675 Pain in left toe(s): Secondary | ICD-10-CM

## 2018-05-31 DIAGNOSIS — M79674 Pain in right toe(s): Secondary | ICD-10-CM

## 2018-05-31 DIAGNOSIS — B351 Tinea unguium: Secondary | ICD-10-CM

## 2018-05-31 NOTE — Progress Notes (Signed)
Patient ID: [name], female   DOB: 07/04/1945, 73 y.o.   MRN: [id number] Complaint:  Visit Type: Patient returns to my office for continued preventative foot care services. Complaint: Patient states" my nails have grown long and thick and become painful to walk and wear shoes" Patient has been diagnosed with DM with no complications. He presents for preventative foot care services. No changes to ROS  Podiatric Exam: Vascular: dorsalis pedis and posterior tibial pulses are palpable bilateral. Capillary return is immediate. Temperature gradient is WNL. Skin turgor WNL  Sensorium: Normal Semmes Weinstein monofilament test. Normal tactile sensation bilaterally. Nail Exam: Pt has thick disfigured discolored nails with subungual debris noted bilateral entire nail hallux through fifth toenails Ulcer Exam: There is no evidence of ulcer or pre-ulcerative changes or infection. Orthopedic Exam: Muscle tone and strength are WNL. No limitations in general ROM. No crepitus or effusions noted. Foot type and digits show no abnormalities. Bony prominences are unremarkable. Skin: No Porokeratosis. No infection or ulcers. Xerotic skin plantar B/L  Diagnosis:  Tinea unguium, Pain in right toe, pain in left toes  Treatment & Plan Procedures and Treatment: Consent by patient was obtained for treatment procedures. The patient understood the discussion of treatment and procedures well. All questions were answered thoroughly reviewed. Debridement of mycotic and hypertrophic toenails, 1 through 5 bilateral and clearing of subungual debris. No ulceration, no infection noted.  Return Visit-Office Procedure: Patient instructed to return to the office for a follow up visit 3 months for continued evaluation and treatment.   Gardiner Barefoot DPM

## 2018-06-13 DIAGNOSIS — I10 Essential (primary) hypertension: Secondary | ICD-10-CM | POA: Diagnosis not present

## 2018-06-13 DIAGNOSIS — Z01021 Encounter for examination of eyes and vision following failed vision screening with abnormal findings: Secondary | ICD-10-CM | POA: Diagnosis not present

## 2018-06-13 DIAGNOSIS — Z0001 Encounter for general adult medical examination with abnormal findings: Secondary | ICD-10-CM | POA: Diagnosis not present

## 2018-06-13 DIAGNOSIS — E1159 Type 2 diabetes mellitus with other circulatory complications: Secondary | ICD-10-CM | POA: Diagnosis not present

## 2018-06-13 DIAGNOSIS — Z1329 Encounter for screening for other suspected endocrine disorder: Secondary | ICD-10-CM | POA: Diagnosis not present

## 2018-06-13 DIAGNOSIS — Z131 Encounter for screening for diabetes mellitus: Secondary | ICD-10-CM | POA: Diagnosis not present

## 2018-09-06 ENCOUNTER — Ambulatory Visit (INDEPENDENT_AMBULATORY_CARE_PROVIDER_SITE_OTHER): Payer: Medicare Other | Admitting: Podiatry

## 2018-09-06 ENCOUNTER — Encounter: Payer: Self-pay | Admitting: Podiatry

## 2018-09-06 ENCOUNTER — Other Ambulatory Visit: Payer: Self-pay

## 2018-09-06 VITALS — Temp 97.2°F

## 2018-09-06 DIAGNOSIS — M79675 Pain in left toe(s): Secondary | ICD-10-CM

## 2018-09-06 DIAGNOSIS — M79674 Pain in right toe(s): Secondary | ICD-10-CM

## 2018-09-06 DIAGNOSIS — E119 Type 2 diabetes mellitus without complications: Secondary | ICD-10-CM

## 2018-09-06 DIAGNOSIS — B351 Tinea unguium: Secondary | ICD-10-CM | POA: Diagnosis not present

## 2018-09-06 NOTE — Progress Notes (Signed)
Patient ID: [name], female   DOB: 12/06/1945, 73 y.o.   MRN: [id number] Complaint:  Visit Type: Patient returns to my office for continued preventative foot care services. Complaint: Patient states" my nails have grown long and thick and become painful to walk and wear shoes" Patient has been diagnosed with DM with no complications. He presents for preventative foot care services. No changes to ROS  Podiatric Exam: Vascular: dorsalis pedis and posterior tibial pulses are palpable bilateral. Capillary return is immediate. Temperature gradient is WNL. Skin turgor WNL  Sensorium: Normal Semmes Weinstein monofilament test. Normal tactile sensation bilaterally. Nail Exam: Pt has thick disfigured discolored nails with subungual debris noted bilateral entire nail hallux through fifth toenails Ulcer Exam: There is no evidence of ulcer or pre-ulcerative changes or infection. Orthopedic Exam: Muscle tone and strength are WNL. No limitations in general ROM. No crepitus or effusions noted. Foot type and digits show no abnormalities. Bony prominences are unremarkable. Skin: No Porokeratosis. No infection or ulcers. Xerotic skin plantar B/L  Diagnosis:  Tinea unguium, Pain in right toe, pain in left toes  Treatment & Plan Procedures and Treatment: Consent by patient was obtained for treatment procedures. The patient understood the discussion of treatment and procedures well. All questions were answered thoroughly reviewed. Debridement of mycotic and hypertrophic toenails, 1 through 5 bilateral and clearing of subungual debris. No ulceration, no infection noted.  Return Visit-Office Procedure: Patient instructed to return to the office for a follow up visit 10 weeks  for continued evaluation and treatment.   Chesnee Floren DPM 

## 2018-10-10 DIAGNOSIS — E1159 Type 2 diabetes mellitus with other circulatory complications: Secondary | ICD-10-CM | POA: Diagnosis not present

## 2018-10-10 DIAGNOSIS — R569 Unspecified convulsions: Secondary | ICD-10-CM | POA: Diagnosis not present

## 2018-10-10 DIAGNOSIS — Z23 Encounter for immunization: Secondary | ICD-10-CM | POA: Diagnosis not present

## 2018-10-10 DIAGNOSIS — Z131 Encounter for screening for diabetes mellitus: Secondary | ICD-10-CM | POA: Diagnosis not present

## 2018-10-10 DIAGNOSIS — K219 Gastro-esophageal reflux disease without esophagitis: Secondary | ICD-10-CM | POA: Diagnosis not present

## 2018-10-10 DIAGNOSIS — I509 Heart failure, unspecified: Secondary | ICD-10-CM | POA: Diagnosis not present

## 2018-10-10 DIAGNOSIS — I10 Essential (primary) hypertension: Secondary | ICD-10-CM | POA: Diagnosis not present

## 2018-11-14 ENCOUNTER — Other Ambulatory Visit: Payer: Self-pay | Admitting: Physician Assistant

## 2018-11-14 DIAGNOSIS — Z1231 Encounter for screening mammogram for malignant neoplasm of breast: Secondary | ICD-10-CM

## 2018-11-17 ENCOUNTER — Ambulatory Visit: Payer: Medicare Other | Admitting: Podiatry

## 2018-12-20 ENCOUNTER — Other Ambulatory Visit: Payer: Self-pay

## 2018-12-20 ENCOUNTER — Encounter: Payer: Self-pay | Admitting: Podiatry

## 2018-12-20 ENCOUNTER — Ambulatory Visit (INDEPENDENT_AMBULATORY_CARE_PROVIDER_SITE_OTHER): Payer: Medicare Other | Admitting: Podiatry

## 2018-12-20 DIAGNOSIS — E119 Type 2 diabetes mellitus without complications: Secondary | ICD-10-CM

## 2018-12-20 DIAGNOSIS — M79675 Pain in left toe(s): Secondary | ICD-10-CM

## 2018-12-20 DIAGNOSIS — M79674 Pain in right toe(s): Secondary | ICD-10-CM | POA: Diagnosis not present

## 2018-12-20 DIAGNOSIS — B351 Tinea unguium: Secondary | ICD-10-CM

## 2018-12-20 NOTE — Progress Notes (Signed)
Patient ID: [name], female   DOB: [birth year], 73 y.o.   MRN: [id number] Complaint:  Visit Type: Patient returns to my office for continued preventative foot care services. Complaint: Patient states" my nails have grown long and thick and become painful to walk and wear shoes" Patient has been diagnosed with DM with no complications. He presents for preventative foot care services. No changes to ROS  Podiatric Exam: Vascular: dorsalis pedis and posterior tibial pulses are palpable bilateral. Capillary return is immediate. Temperature gradient is WNL. Skin turgor WNL  Sensorium: Normal Semmes Weinstein monofilament test. Normal tactile sensation bilaterally. Nail Exam: Pt has thick disfigured discolored nails with subungual debris noted bilateral entire nail hallux through fifth toenails Ulcer Exam: There is no evidence of ulcer or pre-ulcerative changes or infection. Orthopedic Exam: Muscle tone and strength are WNL. No limitations in general ROM. No crepitus or effusions noted. Foot type and digits show no abnormalities. Bony prominences are unremarkable. Skin: No Porokeratosis. No infection or ulcers. Xerotic skin plantar B/L  Diagnosis:  Tinea unguium, Pain in right toe, pain in left toes  Treatment & Plan Procedures and Treatment: Consent by patient was obtained for treatment procedures. The patient understood the discussion of treatment and procedures well. All questions were answered thoroughly reviewed. Debridement of mycotic and hypertrophic toenails, 1 through 5 bilateral and clearing of subungual debris. No ulceration, no infection noted.  Return Visit-Office Procedure: Patient instructed to return to the office for a follow up visit 10 weeks  for continued evaluation and treatment.   Gardiner Barefoot DPM

## 2018-12-27 DIAGNOSIS — Z0001 Encounter for general adult medical examination with abnormal findings: Secondary | ICD-10-CM | POA: Diagnosis not present

## 2018-12-27 DIAGNOSIS — E782 Mixed hyperlipidemia: Secondary | ICD-10-CM | POA: Diagnosis not present

## 2018-12-27 DIAGNOSIS — Z1389 Encounter for screening for other disorder: Secondary | ICD-10-CM | POA: Diagnosis not present

## 2018-12-27 DIAGNOSIS — R569 Unspecified convulsions: Secondary | ICD-10-CM | POA: Diagnosis not present

## 2018-12-27 DIAGNOSIS — I10 Essential (primary) hypertension: Secondary | ICD-10-CM | POA: Diagnosis not present

## 2018-12-27 DIAGNOSIS — E1159 Type 2 diabetes mellitus with other circulatory complications: Secondary | ICD-10-CM | POA: Diagnosis not present

## 2019-01-02 ENCOUNTER — Ambulatory Visit
Admission: RE | Admit: 2019-01-02 | Discharge: 2019-01-02 | Disposition: A | Discharge status: Home / Self Care | Payer: Medicare Other | Source: Ambulatory Visit | Attending: Physician Assistant | Admitting: Physician Assistant

## 2019-01-02 ENCOUNTER — Other Ambulatory Visit: Payer: Self-pay

## 2019-01-02 DIAGNOSIS — Z1231 Encounter for screening mammogram for malignant neoplasm of breast: Secondary | ICD-10-CM | POA: Diagnosis not present

## 2019-01-03 DIAGNOSIS — H5213 Myopia, bilateral: Secondary | ICD-10-CM | POA: Diagnosis not present

## 2019-01-30 DIAGNOSIS — H25043 Posterior subcapsular polar age-related cataract, bilateral: Secondary | ICD-10-CM | POA: Diagnosis not present

## 2019-01-30 DIAGNOSIS — H18413 Arcus senilis, bilateral: Secondary | ICD-10-CM | POA: Diagnosis not present

## 2019-01-30 DIAGNOSIS — H25013 Cortical age-related cataract, bilateral: Secondary | ICD-10-CM | POA: Diagnosis not present

## 2019-01-30 DIAGNOSIS — H2513 Age-related nuclear cataract, bilateral: Secondary | ICD-10-CM | POA: Diagnosis not present

## 2019-01-30 DIAGNOSIS — H2511 Age-related nuclear cataract, right eye: Secondary | ICD-10-CM | POA: Diagnosis not present

## 2019-02-28 ENCOUNTER — Ambulatory Visit: Payer: Medicare Other | Admitting: Podiatry

## 2019-03-02 ENCOUNTER — Emergency Department (HOSPITAL_COMMUNITY): Payer: Medicare Other

## 2019-03-02 ENCOUNTER — Emergency Department (HOSPITAL_COMMUNITY)
Admission: EM | Admit: 2019-03-02 | Discharge: 2019-03-02 | Disposition: A | Discharge status: Home / Self Care | Payer: Medicare Other | Source: Home / Self Care | Attending: Emergency Medicine | Admitting: Emergency Medicine

## 2019-03-02 ENCOUNTER — Other Ambulatory Visit: Payer: Self-pay

## 2019-03-02 DIAGNOSIS — R0602 Shortness of breath: Secondary | ICD-10-CM | POA: Diagnosis not present

## 2019-03-02 DIAGNOSIS — E1122 Type 2 diabetes mellitus with diabetic chronic kidney disease: Secondary | ICD-10-CM | POA: Insufficient documentation

## 2019-03-02 DIAGNOSIS — R778 Other specified abnormalities of plasma proteins: Secondary | ICD-10-CM | POA: Diagnosis not present

## 2019-03-02 DIAGNOSIS — N184 Chronic kidney disease, stage 4 (severe): Secondary | ICD-10-CM | POA: Diagnosis not present

## 2019-03-02 DIAGNOSIS — W19XXXA Unspecified fall, initial encounter: Secondary | ICD-10-CM

## 2019-03-02 DIAGNOSIS — I10 Essential (primary) hypertension: Secondary | ICD-10-CM | POA: Diagnosis not present

## 2019-03-02 DIAGNOSIS — G8191 Hemiplegia, unspecified affecting right dominant side: Secondary | ICD-10-CM | POA: Diagnosis not present

## 2019-03-02 DIAGNOSIS — Z9181 History of falling: Secondary | ICD-10-CM | POA: Diagnosis not present

## 2019-03-02 DIAGNOSIS — I6201 Nontraumatic acute subdural hemorrhage: Secondary | ICD-10-CM | POA: Diagnosis not present

## 2019-03-02 DIAGNOSIS — R569 Unspecified convulsions: Secondary | ICD-10-CM | POA: Diagnosis not present

## 2019-03-02 DIAGNOSIS — I5032 Chronic diastolic (congestive) heart failure: Secondary | ICD-10-CM | POA: Insufficient documentation

## 2019-03-02 DIAGNOSIS — R531 Weakness: Secondary | ICD-10-CM | POA: Insufficient documentation

## 2019-03-02 DIAGNOSIS — N189 Chronic kidney disease, unspecified: Secondary | ICD-10-CM | POA: Diagnosis not present

## 2019-03-02 DIAGNOSIS — Z20822 Contact with and (suspected) exposure to covid-19: Secondary | ICD-10-CM | POA: Insufficient documentation

## 2019-03-02 DIAGNOSIS — E1159 Type 2 diabetes mellitus with other circulatory complications: Secondary | ICD-10-CM | POA: Diagnosis not present

## 2019-03-02 DIAGNOSIS — I509 Heart failure, unspecified: Secondary | ICD-10-CM | POA: Diagnosis not present

## 2019-03-02 DIAGNOSIS — E7800 Pure hypercholesterolemia, unspecified: Secondary | ICD-10-CM | POA: Diagnosis not present

## 2019-03-02 DIAGNOSIS — G4733 Obstructive sleep apnea (adult) (pediatric): Secondary | ICD-10-CM | POA: Diagnosis not present

## 2019-03-02 DIAGNOSIS — E785 Hyperlipidemia, unspecified: Secondary | ICD-10-CM | POA: Diagnosis not present

## 2019-03-02 DIAGNOSIS — R296 Repeated falls: Secondary | ICD-10-CM | POA: Diagnosis not present

## 2019-03-02 DIAGNOSIS — S299XXA Unspecified injury of thorax, initial encounter: Secondary | ICD-10-CM | POA: Diagnosis not present

## 2019-03-02 DIAGNOSIS — E875 Hyperkalemia: Secondary | ICD-10-CM | POA: Diagnosis not present

## 2019-03-02 DIAGNOSIS — G92 Toxic encephalopathy: Secondary | ICD-10-CM | POA: Diagnosis not present

## 2019-03-02 DIAGNOSIS — R4701 Aphasia: Secondary | ICD-10-CM | POA: Diagnosis not present

## 2019-03-02 DIAGNOSIS — I959 Hypotension, unspecified: Secondary | ICD-10-CM | POA: Diagnosis not present

## 2019-03-02 DIAGNOSIS — Z79899 Other long term (current) drug therapy: Secondary | ICD-10-CM | POA: Insufficient documentation

## 2019-03-02 DIAGNOSIS — R42 Dizziness and giddiness: Secondary | ICD-10-CM | POA: Diagnosis not present

## 2019-03-02 DIAGNOSIS — J9600 Acute respiratory failure, unspecified whether with hypoxia or hypercapnia: Secondary | ICD-10-CM | POA: Diagnosis not present

## 2019-03-02 DIAGNOSIS — I63412 Cerebral infarction due to embolism of left middle cerebral artery: Secondary | ICD-10-CM | POA: Diagnosis not present

## 2019-03-02 DIAGNOSIS — Z794 Long term (current) use of insulin: Secondary | ICD-10-CM | POA: Insufficient documentation

## 2019-03-02 DIAGNOSIS — E1165 Type 2 diabetes mellitus with hyperglycemia: Secondary | ICD-10-CM | POA: Diagnosis not present

## 2019-03-02 DIAGNOSIS — I5033 Acute on chronic diastolic (congestive) heart failure: Secondary | ICD-10-CM | POA: Diagnosis not present

## 2019-03-02 DIAGNOSIS — I498 Other specified cardiac arrhythmias: Secondary | ICD-10-CM | POA: Diagnosis not present

## 2019-03-02 DIAGNOSIS — I5031 Acute diastolic (congestive) heart failure: Secondary | ICD-10-CM | POA: Diagnosis not present

## 2019-03-02 DIAGNOSIS — I248 Other forms of acute ischemic heart disease: Secondary | ICD-10-CM | POA: Diagnosis not present

## 2019-03-02 DIAGNOSIS — Z743 Need for continuous supervision: Secondary | ICD-10-CM | POA: Diagnosis not present

## 2019-03-02 DIAGNOSIS — Z7982 Long term (current) use of aspirin: Secondary | ICD-10-CM | POA: Insufficient documentation

## 2019-03-02 DIAGNOSIS — R7989 Other specified abnormal findings of blood chemistry: Secondary | ICD-10-CM | POA: Diagnosis not present

## 2019-03-02 DIAGNOSIS — E782 Mixed hyperlipidemia: Secondary | ICD-10-CM | POA: Diagnosis not present

## 2019-03-02 DIAGNOSIS — E118 Type 2 diabetes mellitus with unspecified complications: Secondary | ICD-10-CM | POA: Diagnosis not present

## 2019-03-02 DIAGNOSIS — E872 Acidosis: Secondary | ICD-10-CM | POA: Diagnosis not present

## 2019-03-02 DIAGNOSIS — I6602 Occlusion and stenosis of left middle cerebral artery: Secondary | ICD-10-CM | POA: Diagnosis not present

## 2019-03-02 DIAGNOSIS — N179 Acute kidney failure, unspecified: Secondary | ICD-10-CM | POA: Diagnosis not present

## 2019-03-02 DIAGNOSIS — R0902 Hypoxemia: Secondary | ICD-10-CM | POA: Diagnosis not present

## 2019-03-02 DIAGNOSIS — E119 Type 2 diabetes mellitus without complications: Secondary | ICD-10-CM | POA: Diagnosis not present

## 2019-03-02 DIAGNOSIS — I63512 Cerebral infarction due to unspecified occlusion or stenosis of left middle cerebral artery: Secondary | ICD-10-CM | POA: Diagnosis not present

## 2019-03-02 DIAGNOSIS — Z7722 Contact with and (suspected) exposure to environmental tobacco smoke (acute) (chronic): Secondary | ICD-10-CM | POA: Insufficient documentation

## 2019-03-02 DIAGNOSIS — S79912A Unspecified injury of left hip, initial encounter: Secondary | ICD-10-CM | POA: Diagnosis not present

## 2019-03-02 DIAGNOSIS — J95821 Acute postprocedural respiratory failure: Secondary | ICD-10-CM | POA: Diagnosis not present

## 2019-03-02 DIAGNOSIS — M25552 Pain in left hip: Secondary | ICD-10-CM | POA: Diagnosis not present

## 2019-03-02 DIAGNOSIS — I130 Hypertensive heart and chronic kidney disease with heart failure and stage 1 through stage 4 chronic kidney disease, or unspecified chronic kidney disease: Secondary | ICD-10-CM | POA: Insufficient documentation

## 2019-03-02 DIAGNOSIS — R001 Bradycardia, unspecified: Secondary | ICD-10-CM | POA: Diagnosis not present

## 2019-03-02 LAB — COMPREHENSIVE METABOLIC PANEL
ALT: 13 U/L (ref 0–44)
AST: 17 U/L (ref 15–41)
Albumin: 4 g/dL (ref 3.5–5.0)
Alkaline Phosphatase: 101 U/L (ref 38–126)
Anion gap: 13 (ref 5–15)
BUN: 60 mg/dL — ABNORMAL HIGH (ref 8–23)
CO2: 21 mmol/L — ABNORMAL LOW (ref 22–32)
Calcium: 8.8 mg/dL — ABNORMAL LOW (ref 8.9–10.3)
Chloride: 101 mmol/L (ref 98–111)
Creatinine, Ser: 3.43 mg/dL — ABNORMAL HIGH (ref 0.44–1.00)
GFR calc Af Amer: 15 mL/min — ABNORMAL LOW (ref >60)
GFR calc non Af Amer: 13 mL/min — ABNORMAL LOW (ref >60)
Glucose, Bld: 250 mg/dL — ABNORMAL HIGH (ref 70–99)
Potassium: 4.1 mmol/L (ref 3.5–5.1)
Sodium: 135 mmol/L (ref 135–145)
Total Bilirubin: 1 mg/dL (ref 0.3–1.2)
Total Protein: 7.4 g/dL (ref 6.5–8.1)

## 2019-03-02 LAB — CBC WITH DIFFERENTIAL/PLATELET
Abs Immature Granulocytes: 0.05 10*3/uL (ref 0.00–0.07)
Basophils Absolute: 0 10*3/uL (ref 0.0–0.1)
Basophils Relative: 0 %
Eosinophils Absolute: 0 10*3/uL (ref 0.0–0.5)
Eosinophils Relative: 0 %
HCT: 30.9 % — ABNORMAL LOW (ref 36.0–46.0)
Hemoglobin: 10.4 g/dL — ABNORMAL LOW (ref 12.0–15.0)
Immature Granulocytes: 1 %
Lymphocytes Relative: 5 %
Lymphs Abs: 0.4 10*3/uL — ABNORMAL LOW (ref 0.7–4.0)
MCH: 31.7 pg (ref 26.0–34.0)
MCHC: 33.7 g/dL (ref 30.0–36.0)
MCV: 94.2 fL (ref 80.0–100.0)
Monocytes Absolute: 0.8 10*3/uL (ref 0.1–1.0)
Monocytes Relative: 10 %
Neutro Abs: 7.2 10*3/uL (ref 1.7–7.7)
Neutrophils Relative %: 84 %
Platelets: 213 10*3/uL (ref 150–400)
RBC: 3.28 MIL/uL — ABNORMAL LOW (ref 3.87–5.11)
RDW: 12 % (ref 11.5–15.5)
WBC: 8.5 10*3/uL (ref 4.0–10.5)
nRBC: 0 % (ref 0.0–0.2)

## 2019-03-02 LAB — URINALYSIS, ROUTINE W REFLEX MICROSCOPIC
Bilirubin Urine: NEGATIVE
Glucose, UA: 150 mg/dL — AB
Ketones, ur: NEGATIVE mg/dL
Leukocytes,Ua: NEGATIVE
Nitrite: NEGATIVE
Protein, ur: NEGATIVE mg/dL
Specific Gravity, Urine: 1.008 (ref 1.005–1.030)
pH: 5 (ref 5.0–8.0)

## 2019-03-02 LAB — LACTIC ACID, PLASMA: Lactic Acid, Venous: 1.5 mmol/L (ref 0.5–1.9)

## 2019-03-02 LAB — RESPIRATORY PANEL BY RT PCR (FLU A&B, COVID)
Influenza A by PCR: NEGATIVE
Influenza B by PCR: NEGATIVE
SARS Coronavirus 2 by RT PCR: NEGATIVE

## 2019-03-02 MED ORDER — SODIUM CHLORIDE 0.9 % IV SOLN
1000.0000 mL | INTRAVENOUS | Status: DC
  Administered 2019-03-02: 1000 mL via INTRAVENOUS

## 2019-03-02 MED ORDER — ACETAMINOPHEN 325 MG PO TABS
650.0000 mg | ORAL_TABLET | Freq: Once | ORAL | Status: AC
  Administered 2019-03-02: 650 mg via ORAL
  Filled 2019-03-02: qty 2

## 2019-03-02 NOTE — ED Notes (Signed)
Pt reports "feeling shaky" since yesterday.  Denies sick contacts, fever, cough.

## 2019-03-02 NOTE — ED Notes (Signed)
Discharge paperwork reviewed with pt.  Pt assisted to dress, provided with clean brief.  Pt wheeled to ED entrance to meet with family.

## 2019-03-02 NOTE — ED Notes (Signed)
Pt's oxygen dropped to 89% just standing at bedside.

## 2019-03-02 NOTE — Discharge Instructions (Addendum)
Follow-up with your doctor as needed.  Return here for any trouble breathing.

## 2019-03-02 NOTE — ED Notes (Signed)
Pure wick has been placed. Suction set to 45mmHg.  

## 2019-03-02 NOTE — ED Provider Notes (Signed)
[facility] DEPT Provider Note   CSN: [id number] Arrival date & time: 03/02/19  0750     History Chief Complaint  Patient presents with  . Weakness  . Fall    [name] is a 74 y.o. female.  74 year old female presents after mechanical fall just prior to arrival.  Patient stated she uses a walker and her legs became weak and she fell down.  Did strike her head but did not have loss of consciousness.  Does not take blood thinner.  Denies any neck pain.  States that she has felt shaky the last few days.  Denies any fever, cough, congestion.  No abdominal joint discomfort.  No new weakness in her lower extremities.  No dysuria or hematuria.  MS: Patient to have foul-smelling urine.  Transported here        Past Medical History:  Diagnosis Date  . 1st degree AV block 05/02/2017  . Acute encephalopathy 09/25/2012  . Acute hyperkalemia 05/02/2017  . Acute renal failure ([place]) 12/11/2014  . Acute respiratory failure with hypoxia ([place]) 05/02/2017  . Anemia   . Arterial embolism and thrombosis of upper extremity ([place])   . Bradycardia 12/11/2014  . Bradycardia with 31-40 beats per minute   . CHF (congestive heart failure) ([place])   . Chronic diastolic (congestive) heart failure ([place]) 06/30/2015  . CKD (chronic kidney disease) stage 4, GFR 15-29 ml/min (HCC) 05/02/2017  . CKD (chronic kidney disease), stage IV ([place])   . Depression   . Diabetes mellitus type 2, insulin dependent ([place]) 05/02/2017  . DVT (deep venous thrombosis) ([place])   . Dyslipidemia 05/02/2017  . Elevated troponin 09/26/2012  . Essential hypertension   . GERD (gastroesophageal reflux disease)   . HTN (hypertension) 05/02/2017  . Hyperlipidemia   . Junctional bradycardia   . Morbid obesity ([place])   . Morbid obesity with BMI of 45.0-49.9, adult ([place]) 05/02/2017  . OSA (obstructive sleep apnea) 10/17/2012  . Rhabdomyolysis 09/25/2012  . Seizures ([place])    "epilepsy; they think I had one a couple months ago"  (12/11/2014)  . Symptomatic bradycardia 11/27/2016  . Type II diabetes mellitus [facility])     Patient Active Problem List   Diagnosis Date Noted  . Acute respiratory failure with hypoxia ([place]) 05/02/2017  . Seizures disorder 05/02/2017  . Diabetes mellitus type 2, insulin dependent ([place]) 05/02/2017  . Morbid obesity with BMI of 45.0-49.9, adult ([place]) 05/02/2017  . Anemia 05/02/2017  . CKD (chronic kidney disease) stage 4, GFR 15-29 ml/min (HCC) 05/02/2017  . Dyslipidemia 05/02/2017  . Acute hyperkalemia 05/02/2017  . 1st degree AV block 05/02/2017  . Symptomatic bradycardia 11/27/2016  . DVT (deep venous thrombosis) ([place]) 06/30/2015  . Chronic diastolic (congestive) heart failure ([place]) 06/30/2015  . Bradycardia with 31-40 beats per minute   . Emesis   . Junctional bradycardia   . Arterial embolism and thrombosis of upper extremity ([place])   . Bradycardia 12/11/2014  . Vomiting 12/11/2014  . Acute renal failure ([place]) 12/11/2014  . CKD (chronic kidney disease), stage IV ([place])   . Essential hypertension   . Type II diabetes mellitus ([place])   . OSA (obstructive sleep apnea) 10/17/2012  . Elevated troponin 09/26/2012  . Acute encephalopathy 09/25/2012  . Tremor 09/25/2012  . Rhabdomyolysis 09/25/2012  . Morbid obesity ([place]) 09/16/2006  . DEPRESSION, CHRONIC 09/16/2006  . Seizure ([place]) 09/16/2006  . Hyperlipidemia 09/09/2006  . CONSTIPATION, HX OF 09/09/2006    Past Surgical History:  Procedure  Laterality Date  . THROMBECTOMY BRACHIAL ARTERY Left 12/15/2014   Procedure: THROMBECTOMY BRACHIAL ARTERY; VEIN PATCH ANGIOPLASTY LEFT BRACIAL ARTERY;  Surgeon: Angelia Mould, MD;  Location: [facility];  Service: Vascular;  Laterality: Left;  . TUBAL LIGATION       OB History   No obstetric history on file.     Family History  Problem Relation Age of Onset  . Hypertension Mother        died @ 85 - unknown cause.  [name] Hypertension Father        says she doesn't know anything about  her father.  . Cancer Maternal Aunt   . Seizures Neg Hx     Social History   Tobacco Use  . Smoking status: Never Smoker  . Smokeless tobacco: Never Used  . Tobacco comment: EXPOSED TO 2ND HAND SMOKE X 20+ YEARS.   Substance Use Topics  . Alcohol use: No  . Drug use: No    Home Medications Prior to Admission medications   Medication Sig Start Date End Date Taking? Authorizing Provider  ACCU-CHEK AVIVA PLUS test strip 3 (three) times daily. for testing 04/14/18   [provider]  Accu-Chek Softclix Lancets lancets See admin instructions. 12/29/17   [provider]  amLODipine (NORVASC) 10 MG tablet Take 10 mg by mouth daily.    [provider]  Ascorbic Acid (VITAMIN C PO) Take 1 tablet by mouth daily.    [provider]  aspirin 81 MG chewable tablet Chew 1 tablet (81 mg total) by mouth daily. 12/21/14   Rai, Vernelle Emerald, MD  B-D ULTRAFINE III SHORT PEN 31G X 8 MM MISC See admin instructions. 04/18/18   [provider]  bisacodyl (DULCOLAX) 5 MG EC tablet Take 2 tablets (10 mg total) by mouth daily as needed. 05/06/17   Aline August, MD  carbamazepine (TEGRETOL) 200 MG tablet Take 400 mg by mouth 2 (two) times daily.     [provider]  Cholecalciferol (VITAMIN D-3) 1000 UNITS CAPS Take 1,000 Units by mouth daily at 12 noon.     [provider]  citalopram (CELEXA) 20 MG tablet Take 20 mg by mouth daily.    [provider]  ezetimibe-simvastatin (VYTORIN) 10-40 MG per tablet Take 1 tablet by mouth daily.    [provider]  furosemide (LASIX) 80 MG tablet Take 0.5 tablets (40 mg total) by mouth daily. 05/06/17   Aline August, MD  glipiZIDE (GLUCOTROL XL) 5 MG 24 hr tablet TK 1 T PO QD 03/24/18   [provider]  HUMULIN 70/30 KWIKPEN (70-30) 100 UNIT/ML PEN Inject 5 Units into the skin 2 (two) times daily. 05/06/17   Aline August, MD  hydrALAZINE (APRESOLINE) 25 MG tablet Take 1 tablet (25 mg  total) by mouth 3 (three) times daily. 05/06/17   Aline August, MD  mirtazapine (REMERON) 15 MG tablet TAKE 1 TABLET BY MOUTH ONCE A DAY AT BEDTIME 05/06/18   [provider]  omeprazole (PRILOSEC) 40 MG capsule Take 40 mg by mouth daily.    [provider]    Allergies    Codeine and Metoprolol  Review of Systems   Review of Systems  All other systems reviewed and are negative.   Physical Exam Updated Vital Signs BP (!) 158/117   Pulse 73   Temp (!) 100.8 F (38.2 C) (Oral)   Resp 19   Ht 1.575 m (5\' 2" )   Wt 113.4 kg   SpO2  93%   BMI 45.73 kg/m   Physical Exam Vitals and nursing note reviewed.  Constitutional:      General: She is not in acute distress.    Appearance: Normal appearance. She is well-developed. She is not toxic-appearing.  HENT:     Head: Normocephalic and atraumatic.  Eyes:     General: Lids are normal.     Conjunctiva/sclera: Conjunctivae normal.     Pupils: Pupils are equal, round, and reactive to light.  Neck:     Thyroid: No thyroid mass.     Trachea: No tracheal deviation.  Cardiovascular:     Rate and Rhythm: Normal rate and regular rhythm.     Heart sounds: Normal heart sounds. No murmur. No gallop.   Pulmonary:     Effort: Pulmonary effort is normal. No respiratory distress.     Breath sounds: Normal breath sounds. No stridor. No decreased breath sounds, wheezing, rhonchi or rales.  Abdominal:     General: Bowel sounds are normal. There is no distension.     Palpations: Abdomen is soft.     Tenderness: There is no abdominal tenderness. There is no rebound.  Musculoskeletal:        General: No tenderness. Normal range of motion.     Cervical back: Normal range of motion and neck supple.  Skin:    General: Skin is warm and dry.     Findings: No abrasion or rash.  Neurological:     Mental Status: She is alert and oriented to person, place, and time.     GCS: GCS eye subscore is 4. GCS verbal subscore is 5. GCS motor  subscore is 6.     Cranial Nerves: No cranial nerve deficit.     Sensory: No sensory deficit.     Comments: Strength is 5/5 in upper as well as lower extremities  Psychiatric:        Speech: Speech normal.        Behavior: Behavior normal.     ED Results / Procedures / Treatments   Labs (all labs ordered are listed, but only abnormal results are displayed) Labs Reviewed  CULTURE, BLOOD (ROUTINE X 2)  CULTURE, BLOOD (ROUTINE X 2)  URINE CULTURE  RESPIRATORY PANEL BY RT PCR (FLU A&B, COVID)  LACTIC ACID, PLASMA  LACTIC ACID, PLASMA  COMPREHENSIVE METABOLIC PANEL  CBC WITH DIFFERENTIAL/PLATELET  URINALYSIS, ROUTINE W REFLEX MICROSCOPIC    EKG EKG Interpretation  Date/Time:  Friday March 02 2019 09:01:36 EST Ventricular Rate:  80 PR Interval:    QRS Duration: 93 QT Interval:  393 QTC Calculation: 454 R Axis:   22 Text Interpretation: Sinus rhythm Borderline prolonged PR interval Nonspecific T abnormalities, lateral leads Minimal ST elevation, inferior leads Confirmed by Lacretia Leigh ([id number]) on 03/02/2019 9:58:26 AM   Radiology No results found.  Procedures Procedures (including critical care time)  Medications Ordered in ED Medications  0.9 %  sodium chloride infusion (has no administration in time range)  acetaminophen (TYLENOL) tablet 650 mg (has no administration in time range)    ED Course  I have reviewed the triage vital signs and the nursing notes.  Pertinent labs & imaging results that were available during my care of the patient were reviewed by me and considered in my medical decision making (see chart for details).    MDM Rules/Calculators/A&P                      Patient's labs reviewed and  significant for known chronic kidney disease.  Urinalysis negative for infection.  Chest x-ray without acute findings.  Covid and flu test negative.  Patient was mildly febrile 100.8 and treated with Tylenol.  States that she feels at her baseline at this time.   States that prior to her fall today which occurred while she was ambulating to go to the bathroom with her walker that she did not have any shortness of breath or chest pain.  Pulse oximetry noted here and it with ambulating was 89%.  She remains asymptomatic.  Patient feels at her baseline will be discharged home with return precautions Final Clinical Impression(s) / ED Diagnoses Final diagnoses:  None    Rx / DC Orders ED Discharge Orders    None       Lacretia Leigh, MD 03/02/19 1123

## 2019-03-02 NOTE — ED Triage Notes (Signed)
Pt BIBA from home  Per EMS- EMS called out for mechanical fall onto bottom, denies LOC, denies head or cervical pain.  Pt uses rolling walker.   Pt c/o weakness only since EMS arrival.  Pt denies dysuria. EMS reports malodorous urine.

## 2019-03-02 NOTE — ED Notes (Signed)
Pt able to urinate when asked.  Peri care performed by staff prior to urine collection.

## 2019-03-02 NOTE — ED Notes (Signed)
Failed attempt to collect [facility]. RN has been notified. x2

## 2019-03-02 NOTE — ED Notes (Signed)
[name] sats decreased to 89% on room air, good wave form on monitor.  Pt reports feeling slightly short of breath at that time.  Pt placed on 3L O2 by [place].  Will continue to monitor.  EDP Zenia Resides made aware.

## 2019-03-03 LAB — URINE CULTURE

## 2019-03-04 ENCOUNTER — Inpatient Hospital Stay (HOSPITAL_COMMUNITY)
Admission: EM | Admit: 2019-03-04 | Discharge: 2019-03-14 | DRG: 252 | Disposition: A | Discharge status: Skilled Nursing Facility | Payer: Medicare Other | Attending: Internal Medicine | Admitting: Internal Medicine

## 2019-03-04 ENCOUNTER — Emergency Department (HOSPITAL_COMMUNITY): Payer: Medicare Other

## 2019-03-04 ENCOUNTER — Other Ambulatory Visit: Payer: Self-pay

## 2019-03-04 ENCOUNTER — Encounter (HOSPITAL_COMMUNITY): Payer: Self-pay | Admitting: Emergency Medicine

## 2019-03-04 DIAGNOSIS — E1151 Type 2 diabetes mellitus with diabetic peripheral angiopathy without gangrene: Secondary | ICD-10-CM | POA: Diagnosis present

## 2019-03-04 DIAGNOSIS — I639 Cerebral infarction, unspecified: Secondary | ICD-10-CM

## 2019-03-04 DIAGNOSIS — M25552 Pain in left hip: Secondary | ICD-10-CM | POA: Diagnosis present

## 2019-03-04 DIAGNOSIS — D649 Anemia, unspecified: Secondary | ICD-10-CM

## 2019-03-04 DIAGNOSIS — G4733 Obstructive sleep apnea (adult) (pediatric): Secondary | ICD-10-CM | POA: Diagnosis present

## 2019-03-04 DIAGNOSIS — I130 Hypertensive heart and chronic kidney disease with heart failure and stage 1 through stage 4 chronic kidney disease, or unspecified chronic kidney disease: Principal | ICD-10-CM | POA: Diagnosis present

## 2019-03-04 DIAGNOSIS — K219 Gastro-esophageal reflux disease without esophagitis: Secondary | ICD-10-CM | POA: Diagnosis present

## 2019-03-04 DIAGNOSIS — E11649 Type 2 diabetes mellitus with hypoglycemia without coma: Secondary | ICD-10-CM | POA: Diagnosis present

## 2019-03-04 DIAGNOSIS — I5033 Acute on chronic diastolic (congestive) heart failure: Secondary | ICD-10-CM | POA: Diagnosis present

## 2019-03-04 DIAGNOSIS — Z794 Long term (current) use of insulin: Secondary | ICD-10-CM

## 2019-03-04 DIAGNOSIS — Z885 Allergy status to narcotic agent status: Secondary | ICD-10-CM

## 2019-03-04 DIAGNOSIS — Y92230 Patient room in hospital as the place of occurrence of the external cause: Secondary | ICD-10-CM | POA: Diagnosis not present

## 2019-03-04 DIAGNOSIS — W1830XA Fall on same level, unspecified, initial encounter: Secondary | ICD-10-CM | POA: Diagnosis present

## 2019-03-04 DIAGNOSIS — G8191 Hemiplegia, unspecified affecting right dominant side: Secondary | ICD-10-CM | POA: Diagnosis not present

## 2019-03-04 DIAGNOSIS — R4701 Aphasia: Secondary | ICD-10-CM | POA: Diagnosis not present

## 2019-03-04 DIAGNOSIS — Z4659 Encounter for fitting and adjustment of other gastrointestinal appliance and device: Secondary | ICD-10-CM

## 2019-03-04 DIAGNOSIS — T4275XA Adverse effect of unspecified antiepileptic and sedative-hypnotic drugs, initial encounter: Secondary | ICD-10-CM | POA: Diagnosis not present

## 2019-03-04 DIAGNOSIS — Z7722 Contact with and (suspected) exposure to environmental tobacco smoke (acute) (chronic): Secondary | ICD-10-CM

## 2019-03-04 DIAGNOSIS — Z79899 Other long term (current) drug therapy: Secondary | ICD-10-CM

## 2019-03-04 DIAGNOSIS — R569 Unspecified convulsions: Secondary | ICD-10-CM

## 2019-03-04 DIAGNOSIS — Z86718 Personal history of other venous thrombosis and embolism: Secondary | ICD-10-CM

## 2019-03-04 DIAGNOSIS — R001 Bradycardia, unspecified: Secondary | ICD-10-CM | POA: Diagnosis present

## 2019-03-04 DIAGNOSIS — Z9181 History of falling: Secondary | ICD-10-CM

## 2019-03-04 DIAGNOSIS — G40909 Epilepsy, unspecified, not intractable, without status epilepticus: Secondary | ICD-10-CM | POA: Diagnosis present

## 2019-03-04 DIAGNOSIS — Z20822 Contact with and (suspected) exposure to covid-19: Secondary | ICD-10-CM | POA: Diagnosis present

## 2019-03-04 DIAGNOSIS — I6201 Nontraumatic acute subdural hemorrhage: Secondary | ICD-10-CM | POA: Diagnosis present

## 2019-03-04 DIAGNOSIS — R296 Repeated falls: Secondary | ICD-10-CM | POA: Diagnosis present

## 2019-03-04 DIAGNOSIS — Z7982 Long term (current) use of aspirin: Secondary | ICD-10-CM

## 2019-03-04 DIAGNOSIS — E1122 Type 2 diabetes mellitus with diabetic chronic kidney disease: Secondary | ICD-10-CM | POA: Diagnosis present

## 2019-03-04 DIAGNOSIS — I440 Atrioventricular block, first degree: Secondary | ICD-10-CM | POA: Diagnosis present

## 2019-03-04 DIAGNOSIS — G92 Toxic encephalopathy: Secondary | ICD-10-CM | POA: Diagnosis not present

## 2019-03-04 DIAGNOSIS — E785 Hyperlipidemia, unspecified: Secondary | ICD-10-CM | POA: Diagnosis present

## 2019-03-04 DIAGNOSIS — W19XXXA Unspecified fall, initial encounter: Secondary | ICD-10-CM | POA: Diagnosis not present

## 2019-03-04 DIAGNOSIS — N184 Chronic kidney disease, stage 4 (severe): Secondary | ICD-10-CM | POA: Diagnosis present

## 2019-03-04 DIAGNOSIS — I10 Essential (primary) hypertension: Secondary | ICD-10-CM | POA: Diagnosis present

## 2019-03-04 DIAGNOSIS — Z9289 Personal history of other medical treatment: Secondary | ICD-10-CM

## 2019-03-04 DIAGNOSIS — I63512 Cerebral infarction due to unspecified occlusion or stenosis of left middle cerebral artery: Secondary | ICD-10-CM | POA: Diagnosis not present

## 2019-03-04 DIAGNOSIS — E119 Type 2 diabetes mellitus without complications: Secondary | ICD-10-CM

## 2019-03-04 DIAGNOSIS — R778 Other specified abnormalities of plasma proteins: Secondary | ICD-10-CM

## 2019-03-04 DIAGNOSIS — I6602 Occlusion and stenosis of left middle cerebral artery: Secondary | ICD-10-CM | POA: Diagnosis present

## 2019-03-04 DIAGNOSIS — R531 Weakness: Secondary | ICD-10-CM | POA: Diagnosis not present

## 2019-03-04 DIAGNOSIS — J95821 Acute postprocedural respiratory failure: Secondary | ICD-10-CM | POA: Diagnosis not present

## 2019-03-04 DIAGNOSIS — E875 Hyperkalemia: Secondary | ICD-10-CM | POA: Diagnosis present

## 2019-03-04 DIAGNOSIS — Z888 Allergy status to other drugs, medicaments and biological substances status: Secondary | ICD-10-CM

## 2019-03-04 DIAGNOSIS — Z8249 Family history of ischemic heart disease and other diseases of the circulatory system: Secondary | ICD-10-CM

## 2019-03-04 DIAGNOSIS — Z6841 Body Mass Index (BMI) 40.0 and over, adult: Secondary | ICD-10-CM

## 2019-03-04 DIAGNOSIS — I248 Other forms of acute ischemic heart disease: Secondary | ICD-10-CM | POA: Diagnosis not present

## 2019-03-04 DIAGNOSIS — R29725 NIHSS score 25: Secondary | ICD-10-CM | POA: Diagnosis not present

## 2019-03-04 DIAGNOSIS — N179 Acute kidney failure, unspecified: Secondary | ICD-10-CM | POA: Diagnosis present

## 2019-03-04 DIAGNOSIS — F329 Major depressive disorder, single episode, unspecified: Secondary | ICD-10-CM | POA: Diagnosis present

## 2019-03-04 DIAGNOSIS — I509 Heart failure, unspecified: Secondary | ICD-10-CM

## 2019-03-04 DIAGNOSIS — I959 Hypotension, unspecified: Secondary | ICD-10-CM | POA: Diagnosis present

## 2019-03-04 DIAGNOSIS — I5032 Chronic diastolic (congestive) heart failure: Secondary | ICD-10-CM | POA: Diagnosis present

## 2019-03-04 DIAGNOSIS — E872 Acidosis: Secondary | ICD-10-CM | POA: Diagnosis not present

## 2019-03-04 DIAGNOSIS — N189 Chronic kidney disease, unspecified: Secondary | ICD-10-CM

## 2019-03-04 DIAGNOSIS — D631 Anemia in chronic kidney disease: Secondary | ICD-10-CM | POA: Diagnosis present

## 2019-03-04 DIAGNOSIS — E118 Type 2 diabetes mellitus with unspecified complications: Secondary | ICD-10-CM

## 2019-03-04 LAB — CBC
HCT: 27.5 % — ABNORMAL LOW (ref 36.0–46.0)
Hemoglobin: 9.6 g/dL — ABNORMAL LOW (ref 12.0–15.0)
MCH: 31.6 pg (ref 26.0–34.0)
MCHC: 34.9 g/dL (ref 30.0–36.0)
MCV: 90.5 fL (ref 80.0–100.0)
Platelets: 182 10*3/uL (ref 150–400)
RBC: 3.04 MIL/uL — ABNORMAL LOW (ref 3.87–5.11)
RDW: 11.9 % (ref 11.5–15.5)
WBC: 6 10*3/uL (ref 4.0–10.5)
nRBC: 0 % (ref 0.0–0.2)

## 2019-03-04 LAB — POCT I-STAT EG7
Acid-base deficit: 4 mmol/L — ABNORMAL HIGH (ref 0.0–2.0)
Bicarbonate: 22.3 mmol/L (ref 20.0–28.0)
Calcium, Ion: 1.12 mmol/L — ABNORMAL LOW (ref 1.15–1.40)
HCT: 31 % — ABNORMAL LOW (ref 36.0–46.0)
Hemoglobin: 10.5 g/dL — ABNORMAL LOW (ref 12.0–15.0)
O2 Saturation: 57 %
Potassium: 4 mmol/L (ref 3.5–5.1)
Sodium: 137 mmol/L (ref 135–145)
TCO2: 24 mmol/L (ref 22–32)
pCO2, Ven: 43.5 mmHg — ABNORMAL LOW (ref 44.0–60.0)
pH, Ven: 7.318 (ref 7.250–7.430)
pO2, Ven: 33 mmHg (ref 32.0–45.0)

## 2019-03-04 LAB — HEMOGLOBIN A1C
Hgb A1c MFr Bld: 5.6 % (ref 4.8–5.6)
Mean Plasma Glucose: 114.02 mg/dL

## 2019-03-04 LAB — CBC WITH DIFFERENTIAL/PLATELET
Abs Immature Granulocytes: 0.03 10*3/uL (ref 0.00–0.07)
Basophils Absolute: 0 10*3/uL (ref 0.0–0.1)
Basophils Relative: 0 %
Eosinophils Absolute: 0 10*3/uL (ref 0.0–0.5)
Eosinophils Relative: 0 %
HCT: 29.4 % — ABNORMAL LOW (ref 36.0–46.0)
Hemoglobin: 9.7 g/dL — ABNORMAL LOW (ref 12.0–15.0)
Immature Granulocytes: 1 %
Lymphocytes Relative: 8 %
Lymphs Abs: 0.5 10*3/uL — ABNORMAL LOW (ref 0.7–4.0)
MCH: 31.1 pg (ref 26.0–34.0)
MCHC: 33 g/dL (ref 30.0–36.0)
MCV: 94.2 fL (ref 80.0–100.0)
Monocytes Absolute: 0.7 10*3/uL (ref 0.1–1.0)
Monocytes Relative: 10 %
Neutro Abs: 5.3 10*3/uL (ref 1.7–7.7)
Neutrophils Relative %: 81 %
Platelets: 185 10*3/uL (ref 150–400)
RBC: 3.12 MIL/uL — ABNORMAL LOW (ref 3.87–5.11)
RDW: 12.1 % (ref 11.5–15.5)
WBC: 6.5 10*3/uL (ref 4.0–10.5)
nRBC: 0 % (ref 0.0–0.2)

## 2019-03-04 LAB — CREATININE, SERUM
Creatinine, Ser: 3.45 mg/dL — ABNORMAL HIGH (ref 0.44–1.00)
GFR calc Af Amer: 15 mL/min — ABNORMAL LOW (ref >60)
GFR calc non Af Amer: 13 mL/min — ABNORMAL LOW (ref >60)

## 2019-03-04 LAB — BASIC METABOLIC PANEL
Anion gap: 11 (ref 5–15)
BUN: 58 mg/dL — ABNORMAL HIGH (ref 8–23)
CO2: 23 mmol/L (ref 22–32)
Calcium: 8.4 mg/dL — ABNORMAL LOW (ref 8.9–10.3)
Chloride: 103 mmol/L (ref 98–111)
Creatinine, Ser: 3.65 mg/dL — ABNORMAL HIGH (ref 0.44–1.00)
GFR calc Af Amer: 14 mL/min — ABNORMAL LOW (ref >60)
GFR calc non Af Amer: 12 mL/min — ABNORMAL LOW (ref >60)
Glucose, Bld: 273 mg/dL — ABNORMAL HIGH (ref 70–99)
Potassium: 4 mmol/L (ref 3.5–5.1)
Sodium: 137 mmol/L (ref 135–145)

## 2019-03-04 LAB — GLUCOSE, CAPILLARY
Glucose-Capillary: 110 mg/dL — ABNORMAL HIGH (ref 70–99)
Glucose-Capillary: 75 mg/dL (ref 70–99)

## 2019-03-04 LAB — RESPIRATORY PANEL BY RT PCR (FLU A&B, COVID)
Influenza A by PCR: NEGATIVE
Influenza B by PCR: NEGATIVE
SARS Coronavirus 2 by RT PCR: NEGATIVE

## 2019-03-04 LAB — TROPONIN I (HIGH SENSITIVITY)
Troponin I (High Sensitivity): 483 ng/L (ref <18)
Troponin I (High Sensitivity): 495 ng/L (ref <18)
Troponin I (High Sensitivity): 456 ng/L (ref <18)
Troponin I (High Sensitivity): 431 ng/L (ref <18)

## 2019-03-04 LAB — BRAIN NATRIURETIC PEPTIDE: B Natriuretic Peptide: 1112.8 pg/mL — ABNORMAL HIGH (ref 0.0–100.0)

## 2019-03-04 MED ORDER — PANTOPRAZOLE SODIUM 40 MG PO TBEC
40.0000 mg | DELAYED_RELEASE_TABLET | Freq: Every day | ORAL | Status: DC
  Administered 2019-03-04 – 2019-03-06 (×3): 40 mg via ORAL
  Filled 2019-03-04 (×3): qty 1

## 2019-03-04 MED ORDER — INSULIN ASPART 100 UNIT/ML ~~LOC~~ SOLN: 0.0000 [IU] | Freq: Three times a day (TID) | SUBCUTANEOUS | Status: DC

## 2019-03-04 MED ORDER — SODIUM CHLORIDE 0.9% FLUSH: 3.0000 mL | INTRAVENOUS | Status: DC | PRN

## 2019-03-04 MED ORDER — HEPARIN SODIUM (PORCINE) 5000 UNIT/ML IJ SOLN
5000.0000 [IU] | Freq: Three times a day (TID) | INTRAMUSCULAR | Status: DC
  Administered 2019-03-04 – 2019-03-07 (×9): 5000 [IU] via SUBCUTANEOUS
  Filled 2019-03-04 (×7): qty 1

## 2019-03-04 MED ORDER — INSULIN ASPART PROT & ASPART (70-30 MIX) 100 UNIT/ML ~~LOC~~ SUSP
5.0000 [IU] | Freq: Two times a day (BID) | SUBCUTANEOUS | Status: DC
  Filled 2019-03-04: qty 10

## 2019-03-04 MED ORDER — ONDANSETRON HCL 4 MG/2ML IJ SOLN
4.0000 mg | Freq: Four times a day (QID) | INTRAMUSCULAR | Status: DC | PRN
  Administered 2019-03-07: 09:00:00 4 mg via INTRAVENOUS

## 2019-03-04 MED ORDER — EZETIMIBE-SIMVASTATIN 10-40 MG PO TABS
1.0000 | ORAL_TABLET | Freq: Every day | ORAL | Status: DC
  Administered 2019-03-04 – 2019-03-05 (×2): 1 via ORAL
  Filled 2019-03-04 (×3): qty 1

## 2019-03-04 MED ORDER — FUROSEMIDE 10 MG/ML IJ SOLN
40.0000 mg | Freq: Once | INTRAMUSCULAR | Status: DC
  Filled 2019-03-04: qty 4

## 2019-03-04 MED ORDER — CARBAMAZEPINE 200 MG PO TABS
400.0000 mg | ORAL_TABLET | Freq: Two times a day (BID) | ORAL | Status: DC
  Administered 2019-03-04 – 2019-03-06 (×5): 400 mg via ORAL
  Filled 2019-03-04 (×8): qty 2

## 2019-03-04 MED ORDER — MIRTAZAPINE 15 MG PO TABS
15.0000 mg | ORAL_TABLET | Freq: Every day | ORAL | Status: DC
  Administered 2019-03-04 – 2019-03-06 (×3): 15 mg via ORAL
  Filled 2019-03-04 (×4): qty 1

## 2019-03-04 MED ORDER — SODIUM CHLORIDE 0.9% FLUSH
3.0000 mL | Freq: Two times a day (BID) | INTRAVENOUS | Status: DC
  Administered 2019-03-06 – 2019-03-14 (×16): 3 mL via INTRAVENOUS

## 2019-03-04 MED ORDER — CITALOPRAM HYDROBROMIDE 10 MG PO TABS
20.0000 mg | ORAL_TABLET | Freq: Every day | ORAL | Status: DC
  Administered 2019-03-04 – 2019-03-14 (×10): 20 mg via ORAL
  Filled 2019-03-04: qty 1
  Filled 2019-03-04 (×4): qty 2
  Filled 2019-03-04 (×2): qty 1
  Filled 2019-03-04 (×3): qty 2

## 2019-03-04 MED ORDER — GLIPIZIDE ER 5 MG PO TB24
5.0000 mg | ORAL_TABLET | Freq: Every day | ORAL | Status: DC
  Filled 2019-03-04: qty 1

## 2019-03-04 MED ORDER — NITROGLYCERIN 0.4 MG SL SUBL: 0.4000 mg | SUBLINGUAL_TABLET | SUBLINGUAL | Status: DC | PRN

## 2019-03-04 MED ORDER — ASPIRIN 81 MG PO CHEW
81.0000 mg | CHEWABLE_TABLET | Freq: Every day | ORAL | Status: DC
  Administered 2019-03-04 – 2019-03-06 (×3): 81 mg via ORAL
  Filled 2019-03-04 (×3): qty 1

## 2019-03-04 MED ORDER — SODIUM CHLORIDE 0.9 % IV SOLN: 250.0000 mL | INTRAVENOUS | Status: DC | PRN

## 2019-03-04 MED ORDER — HYDRALAZINE HCL 25 MG PO TABS
25.0000 mg | ORAL_TABLET | Freq: Three times a day (TID) | ORAL | Status: DC
  Administered 2019-03-04 – 2019-03-06 (×7): 25 mg via ORAL
  Filled 2019-03-04 (×7): qty 1

## 2019-03-04 MED ORDER — AMLODIPINE BESYLATE 10 MG PO TABS
10.0000 mg | ORAL_TABLET | Freq: Every day | ORAL | Status: DC
  Administered 2019-03-04 – 2019-03-06 (×3): 10 mg via ORAL
  Filled 2019-03-04 (×3): qty 1

## 2019-03-04 MED ORDER — ACETAMINOPHEN 325 MG PO TABS
650.0000 mg | ORAL_TABLET | ORAL | Status: DC | PRN
  Administered 2019-03-04 – 2019-03-11 (×2): 650 mg via ORAL
  Filled 2019-03-04 (×2): qty 2

## 2019-03-04 MED ORDER — DIFLUPREDNATE 0.05 % OP EMUL: 1.0000 [drp] | Freq: Three times a day (TID) | OPHTHALMIC | Status: DC

## 2019-03-04 MED ORDER — FUROSEMIDE 40 MG PO TABS
40.0000 mg | ORAL_TABLET | Freq: Every day | ORAL | Status: DC
  Administered 2019-03-04 – 2019-03-05 (×2): 40 mg via ORAL
  Filled 2019-03-04 (×2): qty 1

## 2019-03-04 NOTE — ED Provider Notes (Signed)
[facility] EMERGENCY DEPARTMENT Provider Note   CSN: [id number] Arrival date & time: 03/04/19  2010     History Chief Complaint  Patient presents with  . Fall    [name] is a 74 y.o. female with a history congestive heart failure, obesity, chronic kidney disease, hospitalization encephalopathy, diabetes, presented to emergency department with generalized weakness.  The patient reports that she has been feeling very weak and dizzy for the past 2 days.  She initially reported that she had had 2 falls at her house, but when I asked her she told me she is not sure if in fact she fell.  She was reporting pain in her left hip.  She says she has felt dizzy and lightheaded specifically for the past several days.  Of note the patient was seen in our emergency department on 2/5 and had labs and UA checked - likely contaminated UA specimen, with +bacteria only.  She had negative COVID/Flu PCR testing at that time, but was febrile to 100.85F.  Her blood cx from 2/5 have been no growth to date.  Reports she takes lasix BID, does not know her dose, but has not missed any.  She does not follow with any cardiologist she is aware of.  HPI     Past Medical History:  Diagnosis Date  . 1st degree AV block 05/02/2017  . Acute encephalopathy 09/25/2012  . Acute hyperkalemia 05/02/2017  . Acute renal failure ([place]) 12/11/2014  . Acute respiratory failure with hypoxia ([place]) 05/02/2017  . Anemia   . Arterial embolism and thrombosis of upper extremity ([place])   . Chronic diastolic (congestive) heart failure ([place])    a. 04/2017 Echo: EF 60-65%. Gr1 DD. Mod LVH.   . CKD (chronic kidney disease) stage 4, GFR 15-29 ml/min (HCC) 05/02/2017  . Depression   . Diabetes mellitus type 2, insulin dependent ([place]) 05/02/2017  . DVT (deep venous thrombosis) ([place])   . Dyslipidemia 05/02/2017  . Essential hypertension   . GERD (gastroesophageal reflux disease)   . HTN (hypertension) 05/02/2017  . Hyperlipidemia     . Junctional bradycardia   . Morbid obesity with BMI of 45.0-49.9, adult ([place]) 05/02/2017  . OSA (obstructive sleep apnea) 10/17/2012  . Rhabdomyolysis 09/25/2012  . Seizures ([place])    "epilepsy; they think I had one a couple months ago" (12/11/2014)  . Symptomatic bradycardia 11/27/2016  . Type II diabetes mellitus [facility])     Patient Active Problem List   Diagnosis Date Noted  . Acute on chronic diastolic CHF (congestive heart failure) ([place]) 03/04/2019  . Acute respiratory failure with hypoxia ([place]) 05/02/2017  . Seizures disorder 05/02/2017  . Diabetes mellitus type 2, insulin dependent ([place]) 05/02/2017  . Morbid obesity with BMI of 45.0-49.9, adult ([place]) 05/02/2017  . Anemia 05/02/2017  . CKD (chronic kidney disease) stage 4, GFR 15-29 ml/min (HCC) 05/02/2017  . Dyslipidemia 05/02/2017  . Acute hyperkalemia 05/02/2017  . 1st degree AV block 05/02/2017  . Symptomatic bradycardia 11/27/2016  . DVT (deep venous thrombosis) ([place]) 06/30/2015  . Chronic diastolic (congestive) heart failure ([place]) 06/30/2015  . Bradycardia with 31-40 beats per minute   . Emesis   . Junctional bradycardia   . Arterial embolism and thrombosis of upper extremity ([place])   . Bradycardia 12/11/2014  . Vomiting 12/11/2014  . Acute renal failure ([place]) 12/11/2014  . CKD (chronic kidney disease), stage IV ([place])   . Essential hypertension   . Type II diabetes mellitus ([place])   .  OSA (obstructive sleep apnea) 10/17/2012  . Elevated troponin 09/26/2012  . Acute encephalopathy 09/25/2012  . Tremor 09/25/2012  . Rhabdomyolysis 09/25/2012  . Morbid obesity ([place]) 09/16/2006  . DEPRESSION, CHRONIC 09/16/2006  . Seizure ([place]) 09/16/2006  . Hyperlipidemia 09/09/2006  . CONSTIPATION, HX OF 09/09/2006    Past Surgical History:  Procedure Laterality Date  . THROMBECTOMY BRACHIAL ARTERY Left 12/15/2014   Procedure: THROMBECTOMY BRACHIAL ARTERY; VEIN PATCH ANGIOPLASTY LEFT BRACIAL ARTERY;  Surgeon: Angelia Mould,  MD;  Location: [place];  Service: Vascular;  Laterality: Left;  . TUBAL LIGATION       OB History   No obstetric history on file.     Family History  Problem Relation Age of Onset  . Hypertension Mother        died @ 60 - unknown cause.  [name] Hypertension Father        says she doesn't know anything about her father.  . Cancer Maternal Aunt   . Seizures Neg Hx     Social History   Tobacco Use  . Smoking status: Never Smoker  . Smokeless tobacco: Never Used  . Tobacco comment: EXPOSED TO 2ND HAND SMOKE X 20+ YEARS.   Substance Use Topics  . Alcohol use: No  . Drug use: No    Home Medications Prior to Admission medications   Medication Sig Start Date End Date Taking? Authorizing Provider  amLODipine (NORVASC) 10 MG tablet Take 10 mg by mouth daily.   Yes [provider]  Ascorbic Acid (VITAMIN C PO) Take 1 tablet by mouth daily.   Yes [provider]  aspirin 81 MG chewable tablet Chew 1 tablet (81 mg total) by mouth daily. 12/21/14  Yes Rai, Ripudeep K, MD  bisacodyl (DULCOLAX) 5 MG EC tablet Take 2 tablets (10 mg total) by mouth daily as needed. 05/06/17  Yes Aline August, MD  carbamazepine (TEGRETOL) 200 MG tablet Take 400 mg by mouth 2 (two) times daily.    Yes [provider]  Cholecalciferol (VITAMIN D-3) 1000 UNITS CAPS Take 1,000 Units by mouth daily at 12 noon.    Yes [provider]  citalopram (CELEXA) 20 MG tablet Take 20 mg by mouth daily.   Yes [provider]  DUREZOL 0.05 % EMUL Place 1 drop into the right eye 3 (three) times daily. 02/22/19  Yes [provider]  ezetimibe-simvastatin (VYTORIN) 10-40 MG per tablet Take 1 tablet by mouth daily.   Yes [provider]  furosemide (LASIX) 80 MG tablet Take 0.5 tablets (40 mg total) by mouth daily. 05/06/17  Yes Aline August, MD  glipiZIDE (GLUCOTROL XL) 5 MG 24 hr tablet Take 5 mg by mouth daily with breakfast.  03/24/18  Yes [provider]    HUMULIN 70/30 KWIKPEN (70-30) 100 UNIT/ML PEN Inject 5 Units into the skin 2 (two) times daily. 05/06/17  Yes Aline August, MD  hydrALAZINE (APRESOLINE) 25 MG tablet Take 1 tablet (25 mg total) by mouth 3 (three) times daily. 05/06/17  Yes Aline August, MD  mirtazapine (REMERON) 15 MG tablet Take 15 mg by mouth at bedtime.  05/06/18  Yes [provider]  omeprazole (PRILOSEC) 40 MG capsule Take 40 mg by mouth daily.   Yes [provider]  ACCU-CHEK AVIVA PLUS test strip 3 (three) times daily. for testing 04/14/18   [provider]  Accu-Chek Softclix Lancets lancets See admin instructions. 12/29/17   [provider]  B-D ULTRAFINE III SHORT PEN 31G  X 8 MM MISC See admin instructions. 04/18/18   [provider]    Allergies    Codeine and Metoprolol  Review of Systems   Review of Systems  Constitutional: Positive for fatigue. Negative for chills and fever.  Respiratory: Positive for shortness of breath. Negative for cough.   Cardiovascular: Positive for leg swelling. Negative for chest pain and palpitations.  Gastrointestinal: Negative for abdominal pain and vomiting.  Genitourinary: Negative for dysuria and hematuria.  Musculoskeletal: Positive for arthralgias and myalgias.  Neurological: Negative for syncope and speech difficulty.  All other systems reviewed and are negative.   Physical Exam Updated Vital Signs BP 140/65   Pulse 71   Temp 98.5 F (36.9 C) (Oral)   Resp 14   Ht 5\' 2"  (1.575 m)   Wt 110.8 kg   SpO2 99%   BMI 44.68 kg/m   Physical Exam Vitals and nursing note reviewed.  Constitutional:      Appearance: She is well-developed. She is obese.  HENT:     Head: Normocephalic and atraumatic.  Eyes:     Conjunctiva/sclera: Conjunctivae normal.  Cardiovascular:     Rate and Rhythm: Normal rate and regular rhythm.     Pulses: Normal pulses.  Pulmonary:     Effort: Pulmonary effort is normal. No respiratory distress.      Comments: 97% on room air Distant breath sounds, auditory exam limited by habitus Musculoskeletal:     Cervical back: Neck supple.     Comments: No posterior calf tenderness Pitting edema in feet  Skin:    General: Skin is warm and dry.  Neurological:     Mental Status: She is alert.  Psychiatric:        Mood and Affect: Mood normal.        Behavior: Behavior normal.     ED Results / Procedures / Treatments   Labs (all labs ordered are listed, but only abnormal results are displayed) Labs Reviewed  BASIC METABOLIC PANEL - Abnormal; Notable for the following components:      Result Value   Glucose, Bld 273 (*)    BUN 58 (*)    Creatinine, Ser 3.65 (*)    Calcium 8.4 (*)    GFR calc non Af Amer 12 (*)    GFR calc Af Amer 14 (*)    All other components within normal limits  CBC WITH DIFFERENTIAL/PLATELET - Abnormal; Notable for the following components:   RBC 3.12 (*)    Hemoglobin 9.7 (*)    HCT 29.4 (*)    Lymphs Abs 0.5 (*)    All other components within normal limits  BRAIN NATRIURETIC PEPTIDE - Abnormal; Notable for the following components:   B Natriuretic Peptide 1,112.8 (*)    All other components within normal limits  CBC - Abnormal; Notable for the following components:   RBC 3.04 (*)    Hemoglobin 9.6 (*)    HCT 27.5 (*)    All other components within normal limits  CREATININE, SERUM - Abnormal; Notable for the following components:   Creatinine, Ser 3.45 (*)    GFR calc non Af Amer 13 (*)    GFR calc Af Amer 15 (*)    All other components within normal limits  GLUCOSE, CAPILLARY - Abnormal; Notable for the following components:   Glucose-Capillary 110 (*)    All other components within normal limits  POCT I-STAT EG7 - Abnormal; Notable for the following components:   pCO2, Ven 43.5 (*)  Acid-base deficit 4.0 (*)    Calcium, Ion 1.12 (*)    HCT 31.0 (*)    Hemoglobin 10.5 (*)    All other components within normal limits  TROPONIN I (HIGH  SENSITIVITY) - Abnormal; Notable for the following components:   Troponin I (High Sensitivity) 483 (*)    All other components within normal limits  TROPONIN I (HIGH SENSITIVITY) - Abnormal; Notable for the following components:   Troponin I (High Sensitivity) 495 (*)    All other components within normal limits  TROPONIN I (HIGH SENSITIVITY) - Abnormal; Notable for the following components:   Troponin I (High Sensitivity) 456 (*)    All other components within normal limits  RESPIRATORY PANEL BY RT PCR (FLU A&B, COVID)  HEMOGLOBIN X3K  BASIC METABOLIC PANEL  LIPID PANEL  TSH  HEMOGLOBIN A1C  I-STAT VENOUS BLOOD GAS, ED  TROPONIN I (HIGH SENSITIVITY)    EKG EKG Interpretation  Date/Time:  Sunday March 04 2019 09:31:47 EST Ventricular Rate:  82 PR Interval:    QRS Duration: 105 QT Interval:  398 QTC Calculation: 465 R Axis:   58 Text Interpretation: Sinus rhythm Borderline prolonged PR interval Low voltage, precordial leads RSR' in V1 or V2, right VCD or RVH Borderline T abnormalities, anterior leads No STEMi Confirmed by Octaviano Glow [phone]) on 03/04/2019 9:37:57 AM   Radiology DG Chest 1 View  Result Date: 03/04/2019 CLINICAL DATA:  Fall and pain. EXAM: CHEST  1 VIEW COMPARISON:  03/02/2019 FINDINGS: Cardiomegaly and elevated RIGHT hemidiaphragm again noted. There is no evidence of focal airspace disease, pulmonary edema, suspicious pulmonary nodule/mass, pleural effusion, or pneumothorax. No acute bony abnormalities are identified. IMPRESSION: Cardiomegaly without evidence of acute cardiopulmonary disease. Electronically Signed   By: Margarette Canada M.D.   On: 03/04/2019 11:03   DG Hip Unilat W or Wo Pelvis 2-3 Views Left  Result Date: 03/04/2019 CLINICAL DATA:  Acute LEFT hip pain following fall. Initial encounter. EXAM: DG HIP (WITH OR WITHOUT PELVIS) 2-3V LEFT COMPARISON:  12/12/2014 and prior studies FINDINGS: No acute fracture, subluxation or dislocation. Joint space  narrowing and osteophytosis of both hips again noted. No suspicious focal bony lesions are present. IMPRESSION: No acute abnormality. Electronically Signed   By: Margarette Canada M.D.   On: 03/04/2019 11:06    Procedures Procedures (including critical care time)  Medications Ordered in ED Medications  aspirin chewable tablet 81 mg (81 mg Oral Given 03/04/19 1617)  amLODipine (NORVASC) tablet 10 mg (10 mg Oral Given 03/04/19 1617)  ezetimibe-simvastatin (VYTORIN) 10-40 MG per tablet 1 tablet (has no administration in time range)  furosemide (LASIX) tablet 40 mg (40 mg Oral Given 03/04/19 1626)  hydrALAZINE (APRESOLINE) tablet 25 mg (0 mg Oral Hold 03/04/19 1621)  citalopram (CELEXA) tablet 20 mg (20 mg Oral Given 03/04/19 1617)  mirtazapine (REMERON) tablet 15 mg (has no administration in time range)  glipiZIDE (GLUCOTROL XL) 24 hr tablet 5 mg (has no administration in time range)  insulin aspart protamine- aspart (NOVOLOG MIX 70/30) injection 5 Units (has no administration in time range)  pantoprazole (PROTONIX) EC tablet 40 mg (40 mg Oral Given 03/04/19 1617)  carbamazepine (TEGRETOL) tablet 400 mg (0 mg Oral Hold 03/04/19 1619)  Difluprednate 0.05 % EMUL 1 drop (has no administration in time range)  nitroGLYCERIN (NITROSTAT) SL tablet 0.4 mg (has no administration in time range)  acetaminophen (TYLENOL) tablet 650 mg (650 mg Oral Given 03/04/19 1617)  ondansetron (ZOFRAN) injection 4 mg (has no administration  in time range)  heparin injection 5,000 Units (5,000 Units Subcutaneous Given 03/04/19 1620)  sodium chloride flush (NS) 0.9 % injection 3 mL (0 mLs Intravenous Hold 03/04/19 1621)  sodium chloride flush (NS) 0.9 % injection 3 mL (has no administration in time range)  0.9 %  sodium chloride infusion (has no administration in time range)  insulin aspart (novoLOG) injection 0-15 Units (0 Units Subcutaneous Hold 03/04/19 1605)  furosemide (LASIX) injection 40 mg (0 mg Intravenous Hold 03/04/19 1703)    ED  Course  I have reviewed the triage vital signs and the nursing notes.  Pertinent labs & imaging results that were available during my care of the patient were reviewed by me and considered in my medical decision making (see chart for details).  74 yo female w/ CHF on lasix presenting with generalized fatigue for several days.  Found to have elevated troponin and BNP, suggestive most of CHF exacerbation.  Last echo was in 2019 - will need admission for diuresis and repeat echo.   Less likely ACS with no active chest pain.  No STEMI on ECG.  Suspect troponin elevation related to CHF, but she will need this to be trended.  Appreciate cardiology recommendations.  Covid negative here by PCR.  Flu negative.  Less likely PE without hypoxia, tachypnea.  Lower suspicion at this time.  KEELIE ZEMANEK was evaluated in Emergency Department on 03/04/2019 for the symptoms described in the history of present illness. She was evaluated in the context of the global COVID-19 pandemic, which necessitated consideration that the patient might be at risk for infection with the SARS-CoV-2 virus that causes COVID-19. Institutional protocols and algorithms that pertain to the evaluation of patients at risk for COVID-19 are in a state of rapid change based on information released by regulatory bodies including the CDC and federal and state organizations. These policies and algorithms were followed during the patient's care in the ED.    Clinical Course as of Mar 03 1733  Sun Mar 04, 2019  1103 Last echo 04/2017 with EF 60-65%, low grade diastolic dysfunction.  Prior troponins have been elevated on old troponin scale, 0.23, 0.44   [MT]  1109 Cards consult placed.  Patient not tachycardic or hypoxia, I would hold off on CT PE study at this time given her poor renal function.  I will discuss the case with cardiology   [MT]  1137 I spoke to Dr Clemencia Course of cardiology who will come evaluate patient, anticipates admission to  cardiology service for CHF management.  He agrees with no heparin for now, suspect troponemia is related to CHF and not ACS.  Patient has no chest pain currently   [MT]    Clinical Course User Index [MT] Greysin Medlen, Carola Rhine, MD    Final Clinical Impression(s) / ED Diagnoses Final diagnoses:  Acute on chronic congestive heart failure, unspecified heart failure type ([place])  Elevated troponin  Chronic kidney disease, unspecified CKD stage  Weakness    Rx / DC Orders ED Discharge Orders    None       Wyvonnia Dusky, MD 03/04/19 1735

## 2019-03-04 NOTE — ED Notes (Signed)
I attempted twice to get an IV in pt w/o success, put consult in to IV team

## 2019-03-04 NOTE — H&P (Addendum)
History & Physical    Patient ID: ESTEFANIA KAMIYA MRN: [id number], DOB/AGE: November 17, 1945   Admit date: 03/04/2019   Primary Physician: Benito Mccreedy, MD Primary Cardiologist: Lauree Chandler, MD  Patient Profile    74 year old female with a history of HFpEF, obesity, hypertension, hyperlipidemia, diabetes, stage IV chronic kidney disease, and bradycardia, who presented to the emergency department today after feeling weak and falling at her home and was found to have an elevated high-sensitivity troponin and BNP.  Past Medical History    Past Medical History:  Diagnosis Date  . 1st degree AV block 05/02/2017  . Acute encephalopathy 09/25/2012  . Acute hyperkalemia 05/02/2017  . Acute renal failure ([place]) 12/11/2014  . Acute respiratory failure with hypoxia ([place]) 05/02/2017  . Anemia   . Arterial embolism and thrombosis of upper extremity ([place])   . Chronic diastolic (congestive) heart failure ([place])    a. 04/2017 Echo: EF 60-65%. Gr1 DD. Mod LVH.   . CKD (chronic kidney disease) stage 4, GFR 15-29 ml/min ([facility]) 05/02/2017  . Depression   . Diabetes mellitus type 2, insulin dependent ([place]) 05/02/2017  . DVT (deep venous thrombosis) ([place])   . Dyslipidemia 05/02/2017  . Essential hypertension   . GERD (gastroesophageal reflux disease)   . HTN (hypertension) 05/02/2017  . Hyperlipidemia   . Junctional bradycardia   . Morbid obesity with BMI of 45.0-49.9, adult ([place]) 05/02/2017  . OSA (obstructive sleep apnea) 10/17/2012  . Rhabdomyolysis 09/25/2012  . Seizures ([place])    "epilepsy; they think I had one a couple months ago" (12/11/2014)  . Symptomatic bradycardia 11/27/2016  . Type II diabetes mellitus ([place])     Past Surgical History:  Procedure Laterality Date  . THROMBECTOMY BRACHIAL ARTERY Left 12/15/2014   Procedure: THROMBECTOMY BRACHIAL ARTERY; VEIN PATCH ANGIOPLASTY LEFT BRACIAL ARTERY;  Surgeon: Angelia Mould, MD;  Location: [facility];  Service: Vascular;  Laterality: Left;  . TUBAL  LIGATION       Allergies  Allergies  Allergen Reactions  . Codeine Nausea And Vomiting  . Metoprolol Other (See Comments)    Severe bradycardia.  Now getting admitted for 3rd time in 3 years for bradycardia onset after being started on toprol.  Please dont put patient on this med anymore!    History of Present Illness    74 year old female with a history of HFpEF, obesity, hypertension, hyperlipidemia, diabetes, stage IV chronic kidney disease, and bradycardia.  She was previously evaluated by cardiology in November 2016 with junctional bradycardia in the setting of worsening renal failure, clonidine, and beta-blocker therapy.  Echocardiogram at that time showed normal LV function.  She was seen again in consultation in June 2017 in the setting of recurrent junctional bradycardia.  He was strongly advised that she avoid AV nodal blocking agents.  She was most recently seen by our team in April 2019 in the setting of respiratory failure with acute on chronic diastolic heart failure and mild troponin elevation.  Echo showed normal LV function with grade 1 diastolic dysfunction.  Following diuresis, is recommended that she follow-up as an outpatient for consideration of stress testing.  Unfortunately, she was lost to follow-up.  Patient lives locally by herself.  She uses a walker for ambulation.  She eats a lot of processed food.  She has chronic dyspnea on exertion and notes that over the past few weeks, this is worsened some.  She was seen in the emergency room on February 5 following a mechanical fall.  She was  noted to have a low-grade fever at 100.8 and was treated with Tylenol.  Work-up was otherwise unremarkable with exception of a finding of an oxygen saturation of 89% with ambulation, which was felt to be her baseline and she was discharged home.  Unfortunately, this morning she awoke feeling shaky, a symptom that she identifies as signifying low blood sugar.  She she was very unsteady on her  feet and called EMS.  She was found to have a glucose of 67.  The EMTs helped her make a bowl of cereal and after eating some, she felt better.  It was not felt that she required hospitalization.  Unfortunately, after she completed her bowl of cereal she stood up and again felt very weak.  She had difficulty supporting herself on her walker and after making it into her bedroom, she fell to the floor.  She was not experiencing any presyncope and did not lose consciousness.  She says simply that her legs were too weak and her arms are becoming shaky on the walker.  She has an alert button attached to her walker and she activated it.  EMS arrived and she was taken back to the ED.  Here, she was hemodynamically stable.  Hip x-ray showed no acute abnormality.  Chest x-ray showed cardiomegaly without cardiopulmonary disease.  ECG shows sinus rhythm first-degree AV block and anterior T wave abnormalities.  Covid screen was negative.  Lab work notable for creatinine of 3.65, which is above prior baseline, and a BNP of 1112.8.  High-sensitivity troponin also elevated at 483  495.  Out of concern for acute on chronic diastolic congestive heart failure, we have been consulted.  Currently she is asymptomatic at rest.  Home Medications    Prior to Admission medications   Medication Sig Start Date End Date Taking? Authorizing Provider  amLODipine (NORVASC) 10 MG tablet Take 10 mg by mouth daily.   Yes [provider]  Ascorbic Acid (VITAMIN C PO) Take 1 tablet by mouth daily.   Yes [provider]  aspirin 81 MG chewable tablet Chew 1 tablet (81 mg total) by mouth daily. 12/21/14  Yes Rai, Ripudeep K, MD  bisacodyl (DULCOLAX) 5 MG EC tablet Take 2 tablets (10 mg total) by mouth daily as needed. 05/06/17  Yes Aline August, MD  carbamazepine (TEGRETOL) 200 MG tablet Take 400 mg by mouth 2 (two) times daily.    Yes [provider]  Cholecalciferol (VITAMIN D-3) 1000 UNITS CAPS Take 1,000 Units  by mouth daily at 12 noon.    Yes [provider]  citalopram (CELEXA) 20 MG tablet Take 20 mg by mouth daily.   Yes [provider]  DUREZOL 0.05 % EMUL Place 1 drop into the right eye 3 (three) times daily. 02/22/19  Yes [provider]  ezetimibe-simvastatin (VYTORIN) 10-40 MG per tablet Take 1 tablet by mouth daily.   Yes [provider]  furosemide (LASIX) 80 MG tablet Take 0.5 tablets (40 mg total) by mouth daily. 05/06/17  Yes Aline August, MD  glipiZIDE (GLUCOTROL XL) 5 MG 24 hr tablet Take 5 mg by mouth daily with breakfast.  03/24/18  Yes [provider]  HUMULIN 70/30 KWIKPEN (70-30) 100 UNIT/ML PEN Inject 5 Units into the skin 2 (two) times daily. 05/06/17  Yes Aline August, MD  hydrALAZINE (APRESOLINE) 25 MG tablet Take 1 tablet (25 mg total) by mouth 3 (three) times daily. 05/06/17  Yes Aline August, MD  mirtazapine (REMERON) 15 MG tablet  Take 15 mg by mouth at bedtime.  05/06/18  Yes [provider]  omeprazole (PRILOSEC) 40 MG capsule Take 40 mg by mouth daily.   Yes [provider]  ACCU-CHEK AVIVA PLUS test strip 3 (three) times daily. for testing 04/14/18   [provider]  Accu-Chek Softclix Lancets lancets See admin instructions. 12/29/17   [provider]  B-D ULTRAFINE III SHORT PEN 31G X 8 MM MISC See admin instructions. 04/18/18   [provider]    Family History    Family History  Problem Relation Age of Onset  . Hypertension Mother        died @ 83 - unknown cause.  [name] Hypertension Father        says she doesn't know anything about her father.  . Cancer Maternal Aunt   . Seizures Neg Hx    She indicated that her mother is deceased. She indicated that the status of her father is unknown. She indicated that the status of her maternal aunt is unknown. She indicated that the status of her neg hx is unknown.   Social History    Social History   Socioeconomic History  .  Marital status: Divorced    Spouse name: Not on file  . Number of children: 2  . Years of education: 10  . Highest education level: Not on file  Occupational History  . Occupation: Retired  Tobacco Use  . Smoking status: Never Smoker  . Smokeless tobacco: Never Used  . Tobacco comment: EXPOSED TO 2ND HAND SMOKE X 20+ YEARS.   Substance and Sexual Activity  . Alcohol use: No  . Drug use: No  . Sexual activity: Never  Other Topics Concern  . Not on file  Social History Narrative   Lives in [place] by herself.  Daughters nearby.  Sedentary.   Eats fast food for at least one meal almost daily.   Caffeine use:  Drinks soda daily.   Coffee rarely.    Social Determinants of Health   Financial Resource Strain:   . Difficulty of Paying Living Expenses: Not on file  Food Insecurity:   . Worried About Charity fundraiser in the Last Year: Not on file  . Ran Out of Food in the Last Year: Not on file  Transportation Needs:   . Lack of Transportation (Medical): Not on file  . Lack of Transportation (Non-Medical): Not on file  Physical Activity:   . Days of Exercise per Week: Not on file  . Minutes of Exercise per Session: Not on file  Stress:   . Feeling of Stress : Not on file  Social Connections:   . Frequency of Communication with Friends and Family: Not on file  . Frequency of Social Gatherings with Friends and Family: Not on file  . Attends Religious Services: Not on file  . Active Member of Clubs or Organizations: Not on file  . Attends Archivist Meetings: Not on file  . Marital Status: Not on file  Intimate Partner Violence:   . Fear of Current or Ex-Partner: Not on file  . Emotionally Abused: Not on file  . Physically Abused: Not on file  . Sexually Abused: Not on file     Review of Systems    General:  +++ Leg weakness resulting in fall.  No chills, fever, night sweats or weight changes.  Cardiovascular:  No chest pain, +++ dyspnea on exertion-worsening in the  past 2 weeks.  No edema, +++  chronic two-pillow orthopnea, no palpitations, paroxysmal nocturnal dyspnea. Dermatological: No rash, lesions/masses Respiratory: No cough, +++ dyspnea-worsened in the past 2 weeks. Urologic: No hematuria, dysuria Abdominal:   No nausea, vomiting, diarrhea, bright red blood per rectum, melena, or hematemesis Neurologic:  No visual changes, wkns, changes in mental status. All other systems reviewed and are otherwise negative except as noted above.  Physical Exam    Blood pressure 140/65, pulse 71, temperature 98.5 F (36.9 C), temperature source Oral, resp. rate 14, height 5\' 2"  (1.575 m), weight 110.8 kg, SpO2 99 %.  General: Pleasant, NAD Psych: Normal affect. Neuro: Alert and oriented X 3. Moves all extremities spontaneously. HEENT: Normal  Neck: Supple without bruits.  Body habitus makes it difficult to gauge JVP. Lungs:  Resp regular and unlabored, diminished breath sounds at bases. Heart: RRR no s3, s4, or murmurs. Abdomen: Obese, soft, non-tender, non-distended, BS + x 4.  Extremities: No clubbing, cyanosis or edema. DP/PT/Radials 2+ and equal bilaterally.  Labs    Cardiac Enzymes Recent Labs  Lab 03/04/19 0954 03/04/19 1154  TROPONINIHS 483* 495*      Lab Results  Component Value Date   WBC 6.5 03/04/2019   HGB 10.5 (L) 03/04/2019   HCT 31.0 (L) 03/04/2019   MCV 94.2 03/04/2019   PLT 185 03/04/2019    Recent Labs  Lab 03/02/19 0855 03/02/19 0855 03/04/19 0954 03/04/19 0954 03/04/19 1012  NA 135   < > 137   < > 137  K 4.1   < > 4.0   < > 4.0  CL 101   < > 103  --   --   CO2 21*   < > 23  --   --   BUN 60*   < > 58*  --   --   CREATININE 3.43*   < > 3.65*  --   --   CALCIUM 8.8*   < > 8.4*  --   --   PROT 7.4  --   --   --   --   BILITOT 1.0  --   --   --   --   ALKPHOS 101  --   --   --   --   ALT 13  --   --   --   --   AST 17  --   --   --   --   GLUCOSE 250*   < > 273*  --   --    < > = values in this interval not  displayed.   Lab Results  Component Value Date   CHOL 139 04/03/2010   HDL 52 04/03/2010   LDLCALC 69 04/03/2010   TRIG 92 04/03/2010     Radiology Studies    DG Chest 1 View  Result Date: 03/04/2019 CLINICAL DATA:  Fall and pain. EXAM: CHEST  1 VIEW COMPARISON:  03/02/2019 FINDINGS: Cardiomegaly and elevated RIGHT hemidiaphragm again noted. There is no evidence of focal airspace disease, pulmonary edema, suspicious pulmonary nodule/mass, pleural effusion, or pneumothorax. No acute bony abnormalities are identified. IMPRESSION: Cardiomegaly without evidence of acute cardiopulmonary disease. Electronically Signed   By: Margarette Canada M.D.   On: 03/04/2019 11:03   DG Chest Port 1 View  Result Date: 03/02/2019 CLINICAL DATA:  Shortness of breath. Weakness. EXAM: PORTABLE CHEST 1 VIEW COMPARISON:  04/30/2017 FINDINGS: Mildly degraded exam due to AP portable technique and patient body habitus. Cardiomegaly accentuated by AP portable technique. Atherosclerosis in the transverse  aorta. no right and no definite left pleural effusion. No pneumothorax. No congestive failure. Low lung volumes with resultant pulmonary interstitial prominence. No lobar consolidation. IMPRESSION: Cardiomegaly and low lung volumes, without acute disease. Electronically Signed   By: Abigail Miyamoto M.D.   On: 03/02/2019 08:41   DG Hip Unilat W or Wo Pelvis 2-3 Views Left  Result Date: 03/04/2019 CLINICAL DATA:  Acute LEFT hip pain following fall. Initial encounter. EXAM: DG HIP (WITH OR WITHOUT PELVIS) 2-3V LEFT COMPARISON:  12/12/2014 and prior studies FINDINGS: No acute fracture, subluxation or dislocation. Joint space narrowing and osteophytosis of both hips again noted. No suspicious focal bony lesions are present. IMPRESSION: No acute abnormality. Electronically Signed   By: Margarette Canada M.D.   On: 03/04/2019 11:06    ECG & Cardiac Imaging    Regular sinus rhythm, 82, first-degree AV block, anterior T wave abnormalities-  personally reviewed.  Assessment & Plan    1.  Acute on chronic diastolic congestive heart failure: Patient presents secondary to leg weakness resulting in fall immediately after following a hypoglycemic episode.  On further questioning, however, she also reported a 2-week history of progressive dyspnea on exertion.  She is chronic stable two-pillow orthopnea and denies any lower extremity edema.  Prior echo in 2019 showed normal LV function with grade 1 diastolic dysfunction.  Body habitus makes exam somewhat challenging.  BNP is elevated at 1112.8 in the setting of acute on chronic stage IV kidney disease with a creatinine of 3.65.  She is on oral Lasix at home and will receive a dose of IV Lasix in the ED.  Otherwise continue home meds and adjust as heart rate and blood pressure allow.  Follow-up echocardiogram.  Follow-up basic metabolic panel in the morning and if creatinine stable consider additional intravenous diuresis.  She will need close outpatient follow-up and may benefit from paramedicine in order to prevent repeat admissions.  2.  Elevated troponin: Patient has not been having any chest pain but has a 2-week history of dyspnea exertion.  High-sensitivity troponin 483  495.  Flat trend suggests against acute coronary syndrome.  Continue aspirin and statin.  No AV nodal blocking agents in the setting of history of junctional bradycardia.  Follow-up echocardiogram.  Provided that LV function is normal, once again, we can consider outpatient noninvasive testing.  She may be a good candidate for coronary CT angiography though body habitus may be somewhat limiting in all imaging studies.  3.  Leg weakness/fall: Patient awoke hypoglycemic with a glucose of 67.  After calling EMS and being given a bowl of cereal, she felt better but then after standing, again felt very weak and wobbly.  She subsequently fell in her bedroom without any injury.  Hip films without fracture or acute abnormality.  Last  physical therapy to evaluate.  4..  Essential hypertension: Blood pressure mildly elevated in the ED.  Resume home meds and follow/adjust.  Avoid AV nodal blocking agents given history of junctional bradycardia.  5..  Diabetes mellitus: Continue home medications.  6.  Hyperlipidemia: Continue statin/Zetia therapy.  7.  Acute on chronic stage IV kidney disease: Follow creatinine.  Patient does not see a nephrologist as an outpatient and likely showed.  Avoid nephrotoxic agents.  Signed, Murray Hodgkins, NP 03/04/2019, 3:50 PM   The patient was seen and examined, and I agree with the history, physical exam, assessment and plan as documented above, with modifications as noted below. I have also personally reviewed all relevant  documentation, old records, labs, and both radiographic and cardiovascular studies. I have also independently interpreted old and new ECG's.  Briefly, this is a 74 year old man with a past medical history significant for chronic diastolic heart failure (last hospitalized for this in April 2019), obesity, hypertension, junctional bradycardia, hyperlipidemia, and chronic kidney disease stage IV.  She been feeling weak over the past few days and presented to the ED twice.  She reports falling at home but denies loss of consciousness.  She ambulates with a walker.  She was hypoglycemic with a glucose of 67.  She denies chest pain and palpitations.  She denies worsening of chronic two-pillow orthopnea and paroxysmal nocturnal dyspnea.  She does mention a slight increased work of breathing over the last several days.  She is negative for COVID-19.  Creatinine elevated at 3.65.  BNP elevated to 1112 and high-sensitivity troponins also elevated up to 495.  Hemoglobin 9.6 which had been eleven 1 year ago.  Denies hematochezia and melena.  No radiographic evidence of fractures nor frank CHF.  I personally reviewed the ECG which demonstrates sinus rhythm with first-degree AV block and  nonspecific anterior T wave abnormalities.  She received a dose of IV Lasix in the ED.  I do not plan to order any additional Lasix but she will need to be reassessed tomorrow to see if this is required.  She will need close monitoring of renal function.  We will obtain a follow-up echocardiogram to evaluate for interval changes in cardiac structure and function.  High-sensitivity troponin trend is likely indicative of decompensated diastolic heart failure and not an acute coronary syndrome, particularly in light of stage IV chronic kidney disease.  Continue aspirin and statin.  Could consider an outpatient Lexiscan Myoview.  Would try to avoid contrast given her stage IV CKD.   Kate Sable, MD, [facility]  03/04/2019 4:14 PM

## 2019-03-04 NOTE — Plan of Care (Signed)
  Problem: Activity: Goal: Risk for activity intolerance will decrease Outcome: Progressing   Problem: Safety: Goal: Ability to remain free from injury will improve Outcome: Progressing   

## 2019-03-04 NOTE — ED Triage Notes (Signed)
Pt arrives to ED from home with complaints of two mechanical falls this morning. Patient states she had a fall this morning in her kitchen because she started to feel dizzy. Patient checked her blood sugar which rad 67. Patient ate and drank then as she got up to bed she fell again. Patient states that her left hip hurts.

## 2019-03-05 ENCOUNTER — Observation Stay (HOSPITAL_COMMUNITY): Payer: Medicare Other

## 2019-03-05 DIAGNOSIS — R7989 Other specified abnormal findings of blood chemistry: Secondary | ICD-10-CM | POA: Diagnosis not present

## 2019-03-05 DIAGNOSIS — Z794 Long term (current) use of insulin: Secondary | ICD-10-CM | POA: Diagnosis not present

## 2019-03-05 DIAGNOSIS — R279 Unspecified lack of coordination: Secondary | ICD-10-CM | POA: Diagnosis not present

## 2019-03-05 DIAGNOSIS — E872 Acidosis: Secondary | ICD-10-CM | POA: Diagnosis not present

## 2019-03-05 DIAGNOSIS — I4891 Unspecified atrial fibrillation: Secondary | ICD-10-CM | POA: Diagnosis not present

## 2019-03-05 DIAGNOSIS — Z20822 Contact with and (suspected) exposure to covid-19: Secondary | ICD-10-CM | POA: Diagnosis not present

## 2019-03-05 DIAGNOSIS — G8191 Hemiplegia, unspecified affecting right dominant side: Secondary | ICD-10-CM | POA: Diagnosis not present

## 2019-03-05 DIAGNOSIS — N184 Chronic kidney disease, stage 4 (severe): Secondary | ICD-10-CM | POA: Diagnosis not present

## 2019-03-05 DIAGNOSIS — E1159 Type 2 diabetes mellitus with other circulatory complications: Secondary | ICD-10-CM | POA: Diagnosis not present

## 2019-03-05 DIAGNOSIS — R609 Edema, unspecified: Secondary | ICD-10-CM | POA: Diagnosis not present

## 2019-03-05 DIAGNOSIS — I517 Cardiomegaly: Secondary | ICD-10-CM | POA: Diagnosis not present

## 2019-03-05 DIAGNOSIS — I130 Hypertensive heart and chronic kidney disease with heart failure and stage 1 through stage 4 chronic kidney disease, or unspecified chronic kidney disease: Secondary | ICD-10-CM | POA: Diagnosis not present

## 2019-03-05 DIAGNOSIS — I498 Other specified cardiac arrhythmias: Secondary | ICD-10-CM | POA: Diagnosis not present

## 2019-03-05 DIAGNOSIS — E785 Hyperlipidemia, unspecified: Secondary | ICD-10-CM | POA: Diagnosis not present

## 2019-03-05 DIAGNOSIS — R778 Other specified abnormalities of plasma proteins: Secondary | ICD-10-CM | POA: Diagnosis not present

## 2019-03-05 DIAGNOSIS — J95821 Acute postprocedural respiratory failure: Secondary | ICD-10-CM | POA: Diagnosis not present

## 2019-03-05 DIAGNOSIS — I6201 Nontraumatic acute subdural hemorrhage: Secondary | ICD-10-CM | POA: Diagnosis present

## 2019-03-05 DIAGNOSIS — N179 Acute kidney failure, unspecified: Secondary | ICD-10-CM | POA: Diagnosis present

## 2019-03-05 DIAGNOSIS — I5031 Acute diastolic (congestive) heart failure: Secondary | ICD-10-CM | POA: Diagnosis not present

## 2019-03-05 DIAGNOSIS — G4733 Obstructive sleep apnea (adult) (pediatric): Secondary | ICD-10-CM | POA: Diagnosis not present

## 2019-03-05 DIAGNOSIS — K219 Gastro-esophageal reflux disease without esophagitis: Secondary | ICD-10-CM | POA: Diagnosis not present

## 2019-03-05 DIAGNOSIS — M6281 Muscle weakness (generalized): Secondary | ICD-10-CM | POA: Diagnosis not present

## 2019-03-05 DIAGNOSIS — R296 Repeated falls: Secondary | ICD-10-CM | POA: Diagnosis present

## 2019-03-05 DIAGNOSIS — R4701 Aphasia: Secondary | ICD-10-CM | POA: Diagnosis not present

## 2019-03-05 DIAGNOSIS — N390 Urinary tract infection, site not specified: Secondary | ICD-10-CM | POA: Diagnosis not present

## 2019-03-05 DIAGNOSIS — G40909 Epilepsy, unspecified, not intractable, without status epilepticus: Secondary | ICD-10-CM | POA: Diagnosis present

## 2019-03-05 DIAGNOSIS — W1830XA Fall on same level, unspecified, initial encounter: Secondary | ICD-10-CM | POA: Diagnosis present

## 2019-03-05 DIAGNOSIS — S0990XA Unspecified injury of head, initial encounter: Secondary | ICD-10-CM | POA: Diagnosis not present

## 2019-03-05 DIAGNOSIS — S199XXA Unspecified injury of neck, initial encounter: Secondary | ICD-10-CM | POA: Diagnosis not present

## 2019-03-05 DIAGNOSIS — E875 Hyperkalemia: Secondary | ICD-10-CM | POA: Diagnosis not present

## 2019-03-05 DIAGNOSIS — N189 Chronic kidney disease, unspecified: Secondary | ICD-10-CM | POA: Diagnosis not present

## 2019-03-05 DIAGNOSIS — I959 Hypotension, unspecified: Secondary | ICD-10-CM | POA: Diagnosis present

## 2019-03-05 DIAGNOSIS — S065X0A Traumatic subdural hemorrhage without loss of consciousness, initial encounter: Secondary | ICD-10-CM | POA: Diagnosis not present

## 2019-03-05 DIAGNOSIS — G92 Toxic encephalopathy: Secondary | ICD-10-CM | POA: Diagnosis not present

## 2019-03-05 DIAGNOSIS — I63512 Cerebral infarction due to unspecified occlusion or stenosis of left middle cerebral artery: Secondary | ICD-10-CM | POA: Diagnosis not present

## 2019-03-05 DIAGNOSIS — J9600 Acute respiratory failure, unspecified whether with hypoxia or hypercapnia: Secondary | ICD-10-CM | POA: Diagnosis not present

## 2019-03-05 DIAGNOSIS — I6602 Occlusion and stenosis of left middle cerebral artery: Secondary | ICD-10-CM | POA: Diagnosis not present

## 2019-03-05 DIAGNOSIS — J9601 Acute respiratory failure with hypoxia: Secondary | ICD-10-CM | POA: Diagnosis not present

## 2019-03-05 DIAGNOSIS — R5381 Other malaise: Secondary | ICD-10-CM | POA: Diagnosis not present

## 2019-03-05 DIAGNOSIS — Z743 Need for continuous supervision: Secondary | ICD-10-CM | POA: Diagnosis not present

## 2019-03-05 DIAGNOSIS — Z9181 History of falling: Secondary | ICD-10-CM | POA: Diagnosis not present

## 2019-03-05 DIAGNOSIS — Z6841 Body Mass Index (BMI) 40.0 and over, adult: Secondary | ICD-10-CM | POA: Diagnosis not present

## 2019-03-05 DIAGNOSIS — E1122 Type 2 diabetes mellitus with diabetic chronic kidney disease: Secondary | ICD-10-CM | POA: Diagnosis not present

## 2019-03-05 DIAGNOSIS — I63412 Cerebral infarction due to embolism of left middle cerebral artery: Secondary | ICD-10-CM | POA: Diagnosis not present

## 2019-03-05 DIAGNOSIS — D631 Anemia in chronic kidney disease: Secondary | ICD-10-CM | POA: Diagnosis not present

## 2019-03-05 DIAGNOSIS — I248 Other forms of acute ischemic heart disease: Secondary | ICD-10-CM | POA: Diagnosis not present

## 2019-03-05 DIAGNOSIS — R001 Bradycardia, unspecified: Secondary | ICD-10-CM | POA: Diagnosis not present

## 2019-03-05 DIAGNOSIS — E119 Type 2 diabetes mellitus without complications: Secondary | ICD-10-CM | POA: Diagnosis not present

## 2019-03-05 DIAGNOSIS — E1129 Type 2 diabetes mellitus with other diabetic kidney complication: Secondary | ICD-10-CM | POA: Diagnosis not present

## 2019-03-05 DIAGNOSIS — I5033 Acute on chronic diastolic (congestive) heart failure: Secondary | ICD-10-CM | POA: Diagnosis not present

## 2019-03-05 DIAGNOSIS — Z4682 Encounter for fitting and adjustment of non-vascular catheter: Secondary | ICD-10-CM | POA: Diagnosis not present

## 2019-03-05 DIAGNOSIS — Y92230 Patient room in hospital as the place of occurrence of the external cause: Secondary | ICD-10-CM | POA: Diagnosis not present

## 2019-03-05 DIAGNOSIS — I10 Essential (primary) hypertension: Secondary | ICD-10-CM | POA: Diagnosis not present

## 2019-03-05 DIAGNOSIS — I639 Cerebral infarction, unspecified: Secondary | ICD-10-CM | POA: Diagnosis not present

## 2019-03-05 LAB — LIPID PANEL
Cholesterol: 135 mg/dL (ref 0–200)
HDL: 59 mg/dL (ref >40)
LDL Cholesterol: 59 mg/dL (ref 0–99)
Total CHOL/HDL Ratio: 2.3 RATIO
Triglycerides: 86 mg/dL (ref <150)
VLDL: 17 mg/dL (ref 0–40)

## 2019-03-05 LAB — BASIC METABOLIC PANEL
Anion gap: 12 (ref 5–15)
BUN: 58 mg/dL — ABNORMAL HIGH (ref 8–23)
CO2: 21 mmol/L — ABNORMAL LOW (ref 22–32)
Calcium: 8.3 mg/dL — ABNORMAL LOW (ref 8.9–10.3)
Chloride: 106 mmol/L (ref 98–111)
Creatinine, Ser: 3.53 mg/dL — ABNORMAL HIGH (ref 0.44–1.00)
GFR calc Af Amer: 14 mL/min — ABNORMAL LOW (ref >60)
GFR calc non Af Amer: 12 mL/min — ABNORMAL LOW (ref >60)
Glucose, Bld: 91 mg/dL (ref 70–99)
Potassium: 3.8 mmol/L (ref 3.5–5.1)
Sodium: 139 mmol/L (ref 135–145)

## 2019-03-05 LAB — HEMOGLOBIN A1C
Hgb A1c MFr Bld: 5.6 % (ref 4.8–5.6)
Mean Plasma Glucose: 114 mg/dL

## 2019-03-05 LAB — ECHOCARDIOGRAM COMPLETE
Height: 62 in
Weight: 3823.66 oz

## 2019-03-05 LAB — GLUCOSE, CAPILLARY
Glucose-Capillary: 142 mg/dL — ABNORMAL HIGH (ref 70–99)
Glucose-Capillary: 50 mg/dL — ABNORMAL LOW (ref 70–99)
Glucose-Capillary: 73 mg/dL (ref 70–99)
Glucose-Capillary: 114 mg/dL — ABNORMAL HIGH (ref 70–99)
Glucose-Capillary: 121 mg/dL — ABNORMAL HIGH (ref 70–99)
Glucose-Capillary: 137 mg/dL — ABNORMAL HIGH (ref 70–99)

## 2019-03-05 LAB — TSH: TSH: 1.785 u[IU]/mL (ref 0.350–4.500)

## 2019-03-05 MED ORDER — INSULIN ASPART 100 UNIT/ML ~~LOC~~ SOLN
0.0000 [IU] | Freq: Three times a day (TID) | SUBCUTANEOUS | Status: DC
  Administered 2019-03-06: 12:00:00 2 [IU] via SUBCUTANEOUS
  Administered 2019-03-07 – 2019-03-10 (×4): 1 [IU] via SUBCUTANEOUS
  Administered 2019-03-11: 2 [IU] via SUBCUTANEOUS
  Administered 2019-03-11: 07:00:00 1 [IU] via SUBCUTANEOUS
  Administered 2019-03-11: 12:00:00 2 [IU] via SUBCUTANEOUS
  Administered 2019-03-12 (×2): 1 [IU] via SUBCUTANEOUS
  Administered 2019-03-13 (×2): 2 [IU] via SUBCUTANEOUS
  Administered 2019-03-14: 1 [IU] via SUBCUTANEOUS

## 2019-03-05 MED ORDER — INSULIN ASPART 100 UNIT/ML ~~LOC~~ SOLN
0.0000 [IU] | Freq: Every day | SUBCUTANEOUS | Status: DC
  Administered 2019-03-08 – 2019-03-13 (×5): 2 [IU] via SUBCUTANEOUS

## 2019-03-05 NOTE — Progress Notes (Addendum)
Progress Note  Patient Name: [name] Date of Encounter: 03/05/2019  Primary Cardiologist: Lauree Chandler, MD   Subjective   Pt states that she feels somewhat better today but not back to normal. She got up to the chair this am with assist of the nurse and says that she was still very "wobbly". She denies chest pain or shortness of breath and does not have any LE edema.   Inpatient Medications    Scheduled Meds: . amLODipine  10 mg Oral Daily  . aspirin  81 mg Oral Daily  . carbamazepine  400 mg Oral BID  . citalopram  20 mg Oral Daily  . Difluprednate  1 drop Right Eye TID  . ezetimibe-simvastatin  1 tablet Oral Daily  . furosemide  40 mg Intravenous Once  . furosemide  40 mg Oral Daily  . glipiZIDE  5 mg Oral Q breakfast  . heparin  5,000 Units Subcutaneous Q8H  . hydrALAZINE  25 mg Oral TID  . insulin aspart  0-15 Units Subcutaneous TID WC  . insulin aspart protamine- aspart  5 Units Subcutaneous BID WC  . mirtazapine  15 mg Oral QHS  . pantoprazole  40 mg Oral Daily  . sodium chloride flush  3 mL Intravenous Q12H   Continuous Infusions: . sodium chloride     PRN Meds: sodium chloride, acetaminophen, nitroGLYCERIN, ondansetron (ZOFRAN) IV, sodium chloride flush   Vital Signs    Vitals:   03/04/19 2101 03/05/19 0127 03/05/19 0129 03/05/19 0522  BP: (!) 118/58 (!) 135/121 (!) 141/37 122/72  Pulse: 62 65 67 61  Resp: 20 19  20   Temp: 98.5 F (36.9 C) 98.6 F (37 C)  99.6 F (37.6 C)  TempSrc: Oral Oral  Oral  SpO2: 96% 97%  98%  Weight:    108.4 kg  Height:        Intake/Output Summary (Last 24 hours) at 03/05/2019 1004 Last data filed at 03/05/2019 0916 Gross per 24 hour  Intake 823 ml  Output 450 ml  Net 373 ml   Last 3 Weights 03/05/2019 03/04/2019 03/02/2019  Weight (lbs) 238 lb 15.7 oz 244 lb 4.3 oz 250 lb  Weight (kg) 108.4 kg 110.8 kg 113.399 kg      Telemetry    Sinus rhythm in the 60's - Personally Reviewed  ECG   No new tracings  for review.   Physical Exam   GEN: Obese female, No acute distress.   Neck: No JVD Cardiac: RRR, no murmurs, rubs, or gallops.  Respiratory: Clear to auscultation bilaterally. GI: Soft, nontender, non-distended  MS: No edema; No deformity. Neuro:  Nonfocal  Psych: Normal affect   Labs    High Sensitivity Troponin:   Recent Labs  Lab 03/04/19 0954 03/04/19 1154 03/04/19 1557 03/04/19 1809  TROPONINIHS 483* 495* 456* 431*      Chemistry Recent Labs  Lab 03/02/19 0855 03/02/19 0855 03/04/19 0954 03/04/19 1012 03/04/19 1557 03/05/19 0405  NA 135   < > 137 137  --  139  K 4.1   < > 4.0 4.0  --  3.8  CL 101  --  103  --   --  106  CO2 21*  --  23  --   --  21*  GLUCOSE 250*  --  273*  --   --  91  BUN 60*  --  58*  --   --  58*  CREATININE 3.43*   < > 3.65*  --  3.45* 3.53*  CALCIUM 8.8*  --  8.4*  --   --  8.3*  PROT 7.4  --   --   --   --   --   ALBUMIN 4.0  --   --   --   --   --   AST 17  --   --   --   --   --   ALT 13  --   --   --   --   --   ALKPHOS 101  --   --   --   --   --   BILITOT 1.0  --   --   --   --   --   GFRNONAA 13*   < > 12*  --  13* 12*  GFRAA 15*   < > 14*  --  15* 14*  ANIONGAP 13  --  11  --   --  12   < > = values in this interval not displayed.     Hematology Recent Labs  Lab 03/02/19 0855 03/02/19 0855 03/04/19 0954 03/04/19 1012 03/04/19 1557  WBC 8.5  --  6.5  --  6.0  RBC 3.28*  --  3.12*  --  3.04*  HGB 10.4*   < > 9.7* 10.5* 9.6*  HCT 30.9*   < > 29.4* 31.0* 27.5*  MCV 94.2  --  94.2  --  90.5  MCH 31.7  --  31.1  --  31.6  MCHC 33.7  --  33.0  --  34.9  RDW 12.0  --  12.1  --  11.9  PLT 213  --  185  --  182   < > = values in this interval not displayed.    BNP Recent Labs  Lab 03/04/19 0954  BNP 1,112.8*     DDimer No results for input(s): DDIMER in the last 168 hours.   Radiology    DG Chest 1 View  Result Date: 03/04/2019 CLINICAL DATA:  Fall and pain. EXAM: CHEST  1 VIEW COMPARISON:  03/02/2019  FINDINGS: Cardiomegaly and elevated RIGHT hemidiaphragm again noted. There is no evidence of focal airspace disease, pulmonary edema, suspicious pulmonary nodule/mass, pleural effusion, or pneumothorax. No acute bony abnormalities are identified. IMPRESSION: Cardiomegaly without evidence of acute cardiopulmonary disease. Electronically Signed   By: Margarette Canada M.D.   On: 03/04/2019 11:03   DG Hip Unilat W or Wo Pelvis 2-3 Views Left  Result Date: 03/04/2019 CLINICAL DATA:  Acute LEFT hip pain following fall. Initial encounter. EXAM: DG HIP (WITH OR WITHOUT PELVIS) 2-3V LEFT COMPARISON:  12/12/2014 and prior studies FINDINGS: No acute fracture, subluxation or dislocation. Joint space narrowing and osteophytosis of both hips again noted. No suspicious focal bony lesions are present. IMPRESSION: No acute abnormality. Electronically Signed   By: Margarette Canada M.D.   On: 03/04/2019 11:06    Cardiac Studies   Echocardiogram 05/03/2017 Study Conclusions  - Procedure narrative: Transthoracic echocardiography. Image  quality was suboptimal. The study was technically difficult, as a  result of poor sound wave transmission and body habitus.  Intravenous contrast (Definity) was administered.  - Left ventricle: The cavity size was normal. Wall thickness was  increased in a pattern of moderate LVH. Systolic function was  normal. The estimated ejection fraction was in the range of 60%  to 65%. Doppler parameters are consistent with abnormal left  ventricular relaxation (grade 1 diastolic dysfunction). The E/e&'  ratio is >15, suggesting  elevated LV filling pressure.  - Left atrium: The atrium was normal in size.  - Pericardium, extracardiac: A trivial pericardial effusion was  identified posterior to the heart.   Impressions:  - Techically difficult study. Definity contrast given. LVEF 60-65%,  moderate LVH, grade 1 DD, elevated LV filling pressure, trivial  posterior pericardial  effusion.   Patient Profile     74 y.o. female with a history of HFpEF, obesity, hypertension, hyperlipidemia, diabetes, stage IV chronic kidney disease, and bradycardia, who presented to the emergency department after feeling weak and falling at her home and was found to have an elevated high-sensitivity troponin and BNP. She also had 2 weeks of progressive DOE.   Assessment & Plan    Acute on chronic diastolic heart failure -Echocardiogram 2019 with normal LVEF, grade I DD. -BNP elevated at 1112. Pt also has CKD stage IV. On lasix 40 mg at home.  -Currenlty diuresing with lasix 40 mg IV daily. Only 450 mL UOP documented, however pt states that she had a large amount of urine in the bed not caught by the pure wick. Wt is down 6 pounds.  -Pt does not appear to be significantly volume overloaded on exam. Would continue current treatment for now.  -Repeat echo is pending.  -May benefit from para medicine referral at discharge to prevent repeat admission.   Elevated troponin -No chest pain -Troponins mildly elevated 483, 495, 456, 431. Flat trend not consistent with ACS.  -She continues on aspirin and statin. No AV nodal blocking agents due to history of junctional bradycardia.   CKD Stage IV -SCr 3.65 on admission (has been about 3-3.4 over the last year) -SCr is stable today at 3.53.  -Continue to monitor closely with diuresis.   Hypertension -Home meds include amlodipine 10 mg daily, hydralazine 25 mg TID.  -BP currently well controlled.   Diabetes type 2 on insulin -She was continued on home glipizide and Novolog 70/30. Her AM blood sugar was low so these were held. I will stop these and continue SSI. I placed an order for diabetes coordinator for input on medications.  Leg weakness/fall -Pt was hypoglycemic at time of her fall.  -Ambulates with a walker at home.  -No hip fracture on xray.  -Physical therapy ordered.   Hyperlipidemia -On Vytorin  Anemia of chronic  disease -Hgb 9.6, looks to be longstanding.  -No hematochezia or melena.  For questions or updates, please contact [place] Please consult [email or web address] for contact info under        Signed, Daune Perch, NP  03/05/2019, 10:04 AM     Patient seen and examined.  Agree with above documentation.  On exam, patient is alert and oriented, regular rate and rhythm, no murmurs, lungs CTAB, no LE edema, JVD difficult to appreciate given body habitus.  She reports dyspnea is improved today.  Denies chest pain.  BNP 1112.  Does not appear significantly volume overloaded but difficult to assess given body habitus.  I/Os not accurate.  Weight down 6 lbs.  Will continue IV diuresis.  Will watch renal function.  Donato Heinz, MD

## 2019-03-05 NOTE — Evaluation (Signed)
Physical Therapy Evaluation Patient Details Name: [name] MRN: [id number] DOB: 03/15/1945 Today's Date: 03/05/2019   History of Present Illness  Patient is a 74 y/o female who presents with dyspnea on exertion and multiple falls at home. Admitted with acute on chronic CHF. PMH includes CHF, thoracic scoliosis, CKD stage 4, obesity, HTN, DVT, seizures, DM.  Clinical Impression  Patient presents with generalized weakness, pain, impaired balance and impaired mobility s/p above. Pt lives alone and uses RW for ambulation. Pt reports 3 falls at home leading up to admission. Pt has PRN support of daughter for IADLs. Today, pt requires Mod A for bed mobility and Min guard for standing from EOB. Able to take a few steps to get to chair with Min A for balance/safety. Pt reports LE weakness. Pt continues to be a high fall risk and concerned about her returning home. Would benefit from SNF to maximize independence and mobility prior to return home. Will follow acutely.    Follow Up Recommendations SNF;Supervision for mobility/OOB    Equipment Recommendations  None recommended by PT    Recommendations for Other Services       Precautions / Restrictions Precautions Precautions: Fall Precaution Comments: 3 falls prior to admission Restrictions Weight Bearing Restrictions: No      Mobility  Bed Mobility Overal bed mobility: Needs Assistance Bed Mobility: Rolling;Sidelying to Sit Rolling: Min guard Sidelying to sit: Mod assist;HOB elevated       General bed mobility comments: Able to bring LEs to EOB, assist with trunk to get upright and scoot bottom.  Transfers Overall transfer level: Needs assistance Equipment used: Rolling walker (2 wheeled) Transfers: Sit to/from Stand Sit to Stand: Min guard         General transfer comment: Min guard for safety. Cues for hand placement. Stood from [employer], transferred to chair.  Ambulation/Gait Ambulation/Gait assistance: Min assist Gait  Distance (Feet): 3 Feet Assistive device: Rolling walker (2 wheeled) Gait Pattern/deviations: Shuffle Gait velocity: decreased   General Gait Details: Able to take a few steps to get to chair with assist for balance, RW management. Reports BLE weakness (like they might give).  Stairs            Wheelchair Mobility    Modified Rankin (Stroke Patients Only)       Balance Overall balance assessment: Needs assistance;History of Falls Sitting-balance support: Feet supported;Single extremity supported Sitting balance-Leahy Scale: Fair Sitting balance - Comments: Supervision for safety, total A to donn socks.   Standing balance support: During functional activity Standing balance-Leahy Scale: Poor Standing balance comment: Requires UE support and Min A for dynamic tasks. Reports bil knee "wobbliness"                             Pertinent Vitals/Pain Pain Assessment: Faces Faces Pain Scale: Hurts little more Pain Location: left hip/buttock Pain Descriptors / Indicators: Sore;Aching Pain Intervention(s): Repositioned;Monitored during session    Home Living Family/patient expects to be discharged to:: Private residence Living Arrangements: Alone Available Help at Discharge: Family;Available PRN/intermittently Type of Home: Apartment Home Access: Level entry     Home Layout: One level Home Equipment: Tub bench;Walker - 2 wheels;Cane - single point      Prior Function Level of Independence: Needs assistance   Gait / Transfers Assistance Needed: Uses RW at baseline. Does not drive.  ADL's / Homemaking Assistance Needed: Does ADLs, sink baths. Daughter helps with grocery shopping.  Comments:  3 falls leading to admission     Hand Dominance   Dominant Hand: Right    Extremity/Trunk Assessment   Upper Extremity Assessment Upper Extremity Assessment: Defer to OT evaluation    Lower Extremity Assessment Lower Extremity Assessment: Generalized weakness        Communication   Communication: No difficulties  Cognition Arousal/Alertness: Awake/alert Behavior During Therapy: WFL for tasks assessed/performed Overall Cognitive Status: Within Functional Limits for tasks assessed                                 General Comments: for basic mobility tasks      General Comments      Exercises     Assessment/Plan    PT Assessment Patient needs continued PT services  PT Problem List Decreased strength;Decreased mobility;Decreased safety awareness;Pain;Decreased balance;Decreased activity tolerance       PT Treatment Interventions Therapeutic activities;Gait training;Therapeutic exercise;Patient/family education;Balance training;Functional mobility training    PT Goals (Current goals can be found in the Care Plan section)  Acute Rehab PT Goals Patient Stated Goal: to stop falling PT Goal Formulation: With patient Time For Goal Achievement: 03/19/19 Potential to Achieve Goals: Good    Frequency Min 3X/week   Barriers to discharge Decreased caregiver support lives alone, falls at home    Co-evaluation               AM-PAC PT "6 Clicks" Mobility  Outcome Measure Help needed turning from your back to your side while in a flat bed without using bedrails?: A Little Help needed moving from lying on your back to sitting on the side of a flat bed without using bedrails?: A Lot Help needed moving to and from a bed to a chair (including a wheelchair)?: A Little Help needed standing up from a chair using your arms (e.g., wheelchair or bedside chair)?: A Little Help needed to walk in hospital room?: A Little Help needed climbing 3-5 steps with a railing? : A Lot 6 Click Score: 16    End of Session Equipment Utilized During Treatment: Gait belt Activity Tolerance: Patient tolerated treatment well Patient left: in chair;with call bell/phone within reach;with chair alarm set;with nursing/sitter in room Nurse  Communication: Mobility status PT Visit Diagnosis: Pain;Muscle weakness (generalized) (M62.81);Unsteadiness on feet (R26.81);Difficulty in walking, not elsewhere classified (R26.2) Pain - Right/Left: Left Pain - part of body: Hip    Time: 4097-3532 PT Time Calculation (min) (ACUTE ONLY): 21 min   Charges:   PT Evaluation $PT Eval Moderate Complexity: 1 Mod          Marisa Severin, PT, DPT Acute Rehabilitation Services Pager [phone] [place] 03/05/2019, 1:53 PM

## 2019-03-05 NOTE — Progress Notes (Signed)
*  PRELIMINARY RESULTS* Echocardiogram 2D Echocardiogram has been performed.  [name] 03/05/2019, 11:30 AM

## 2019-03-05 NOTE — Progress Notes (Signed)
Inpatient Diabetes Program Recommendations  AACE/ADA: New Consensus Statement on Inpatient Glycemic Control (2015)  Target Ranges:  Prepandial:   less than 140 mg/dL      Peak postprandial:   less than 180 mg/dL (1-2 hours)      Critically ill patients:  140 - 180 mg/dL   Lab Results  Component Value Date   GLUCAP 114 (H) 03/05/2019   HGBA1C 5.6 03/04/2019    Review of Glycemic Control Results for YOBANA, CULLITON (MRN [id number]) as of 03/05/2019 13:44  Ref. Range 03/04/2019 21:53 03/05/2019 01:32 03/05/2019 06:40 03/05/2019 06:58 03/05/2019 11:35  Glucose-Capillary Latest Ref Range: 70 - 99 mg/dL 75 142 (H) 50 (L) 73 114 (H)   Diabetes history: DM 2 Outpatient Diabetes medications:  Glucotrol XL 5 mg daily, Humulin 70/30 5 units bid Current orders for Inpatient glycemic control:  Novolog moderate tid with meals  Inpatient Diabetes Program Recommendations:   Note referral.  Agree with d/c of Glucotrol and 70/30 insulin.  May consider reducing Novolog correction to "Very Sensitive" starting at 151 mg/dL.    Thanks,  Adah Perl, RN, BC-ADM Inpatient Diabetes Coordinator Pager [phone] (8a-5p)

## 2019-03-06 ENCOUNTER — Inpatient Hospital Stay (HOSPITAL_COMMUNITY): Payer: Medicare Other

## 2019-03-06 DIAGNOSIS — R609 Edema, unspecified: Secondary | ICD-10-CM

## 2019-03-06 LAB — GLUCOSE, CAPILLARY
Glucose-Capillary: 109 mg/dL — ABNORMAL HIGH (ref 70–99)
Glucose-Capillary: 207 mg/dL — ABNORMAL HIGH (ref 70–99)
Glucose-Capillary: 113 mg/dL — ABNORMAL HIGH (ref 70–99)
Glucose-Capillary: 157 mg/dL — ABNORMAL HIGH (ref 70–99)

## 2019-03-06 LAB — BASIC METABOLIC PANEL
Anion gap: 10 (ref 5–15)
BUN: 56 mg/dL — ABNORMAL HIGH (ref 8–23)
CO2: 25 mmol/L (ref 22–32)
Calcium: 8.3 mg/dL — ABNORMAL LOW (ref 8.9–10.3)
Chloride: 101 mmol/L (ref 98–111)
Creatinine, Ser: 3.34 mg/dL — ABNORMAL HIGH (ref 0.44–1.00)
GFR calc Af Amer: 15 mL/min — ABNORMAL LOW (ref >60)
GFR calc non Af Amer: 13 mL/min — ABNORMAL LOW (ref >60)
Glucose, Bld: 192 mg/dL — ABNORMAL HIGH (ref 70–99)
Potassium: 4.5 mmol/L (ref 3.5–5.1)
Sodium: 136 mmol/L (ref 135–145)

## 2019-03-06 LAB — CBC
HCT: 28 % — ABNORMAL LOW (ref 36.0–46.0)
Hemoglobin: 9.3 g/dL — ABNORMAL LOW (ref 12.0–15.0)
MCH: 31.4 pg (ref 26.0–34.0)
MCHC: 33.2 g/dL (ref 30.0–36.0)
MCV: 94.6 fL (ref 80.0–100.0)
Platelets: 205 10*3/uL (ref 150–400)
RBC: 2.96 MIL/uL — ABNORMAL LOW (ref 3.87–5.11)
RDW: 12.1 % (ref 11.5–15.5)
WBC: 4.6 10*3/uL (ref 4.0–10.5)
nRBC: 0 % (ref 0.0–0.2)

## 2019-03-06 LAB — D-DIMER, QUANTITATIVE: D-Dimer, Quant: 2.2 ug/mL-FEU — ABNORMAL HIGH (ref 0.00–0.50)

## 2019-03-06 MED ORDER — FUROSEMIDE 10 MG/ML IJ SOLN: 40.0000 mg | Freq: Every day | INTRAMUSCULAR | Status: DC

## 2019-03-06 MED ORDER — SIMVASTATIN 20 MG PO TABS
40.0000 mg | ORAL_TABLET | Freq: Every day | ORAL | Status: DC
  Administered 2019-03-06: 40 mg via ORAL
  Filled 2019-03-06: qty 2

## 2019-03-06 MED ORDER — FUROSEMIDE 40 MG PO TABS
40.0000 mg | ORAL_TABLET | Freq: Every day | ORAL | Status: DC
  Administered 2019-03-06: 40 mg via ORAL
  Filled 2019-03-06: qty 1

## 2019-03-06 MED ORDER — EZETIMIBE 10 MG PO TABS
10.0000 mg | ORAL_TABLET | Freq: Every day | ORAL | Status: DC
  Administered 2019-03-06: 11:00:00 10 mg via ORAL
  Filled 2019-03-06: qty 1

## 2019-03-06 NOTE — NC FL2 (Signed)
[place] LEVEL OF CARE SCREENING TOOL     IDENTIFICATION  Patient Name: [name] Birthdate: July 25, 1945 Sex: female Admission Date (Current Location): 03/04/2019  [facility] and [place] Number:  Herbalist and Address:  [facility], [place] [address] [place], [place] [address]      Provider Number: [id number]  Attending Physician Name and Address:  Herminio Commons, MD  Relative Name and Phone Number:  Juliann Pulse (daughter) [phone]    Current Level of Care: Hospital Recommended Level of Care: [place] Prior Approval Number:    Date Approved/Denied:   PASRR Number: [id number]  Discharge Plan: SNF    Current Diagnoses: Patient Active Problem List   Diagnosis Date Noted  . Acute on chronic diastolic CHF (congestive heart failure) ([place]) 03/04/2019  . Acute respiratory failure with hypoxia ([place]) 05/02/2017  . Seizures disorder 05/02/2017  . Diabetes mellitus type 2, insulin dependent ([place]) 05/02/2017  . Morbid obesity with BMI of 45.0-49.9, adult ([place]) 05/02/2017  . Anemia 05/02/2017  . CKD (chronic kidney disease) stage 4, GFR 15-29 ml/min (H[facility]) 05/02/2017  . Dyslipidemia 05/02/2017  . Acute hyperkalemia 05/02/2017  . 1st degree AV block 05/02/2017  . Symptomatic bradycardia 11/27/2016  . DVT (deep venous thrombosis) ([place]) 06/30/2015  . Chronic diastolic (congestive) heart failure ([place]) 06/30/2015  . Bradycardia with 31-40 beats per minute   . Emesis   . Junctional bradycardia   . Arterial embolism and thrombosis of upper extremity ([place])   . Bradycardia 12/11/2014  . Vomiting 12/11/2014  . Acute renal failure ([place]) 12/11/2014  . CKD (chronic kidney disease), stage IV ([place])   . Essential hypertension   . Type II diabetes mellitus ([place])   . OSA (obstructive sleep apnea) 10/17/2012  . Elevated troponin 09/26/2012  . Acute encephalopathy 09/25/2012  . Tremor 09/25/2012  . Rhabdomyolysis 09/25/2012   . Morbid obesity ([place]) 09/16/2006  . DEPRESSION, CHRONIC 09/16/2006  . Seizure ([place]) 09/16/2006  . Hyperlipidemia 09/09/2006  . CONSTIPATION, HX OF 09/09/2006    Orientation RESPIRATION BLADDER Height & Weight     Self, Time, Place  Normal Continent Weight: 241 lb 6.5 oz (109.5 kg) Height:  5\' 2"  (157.5 cm)  BEHAVIORAL SYMPTOMS/MOOD NEUROLOGICAL BOWEL NUTRITION STATUS      Continent Diet(see discharge summary)  AMBULATORY STATUS COMMUNICATION OF NEEDS Skin   Limited Assist Verbally Other (Comment)(cracking on feet)                       Personal Care Assistance Level of Assistance  Bathing, Feeding, Dressing, Total care Bathing Assistance: Limited assistance Feeding assistance: Independent Dressing Assistance: Limited assistance Total Care Assistance: Limited assistance   Functional Limitations Info  Sight, Speech, Hearing Sight Info: Adequate Hearing Info: Adequate Speech Info: Adequate    SPECIAL CARE FACTORS FREQUENCY  PT (By licensed PT), OT (By licensed OT)     PT Frequency: min 5x weekly OT Frequency: min 5x weekly            Contractures Contractures Info: Not present    Additional Factors Info  Code Status, Allergies Code Status Info: full Allergies Info: Codeine, Metoprolol           Current Medications (03/06/2019):  This is the current hospital active medication list Current Facility-Administered Medications  Medication Dose Route Frequency Provider Last Rate Last Admin  . 0.9 %  sodium chloride infusion  250 mL Intravenous PRN Theora Gianotti, NP      .  acetaminophen (TYLENOL) tablet 650 mg  650 mg Oral Q4H PRN Theora Gianotti, NP   650 mg at 03/04/19 1617  . amLODipine (NORVASC) tablet 10 mg  10 mg Oral Daily Theora Gianotti, NP   10 mg at 03/05/19 0630  . aspirin chewable tablet 81 mg  81 mg Oral Daily Theora Gianotti, NP   81 mg at 03/05/19 1601  . carbamazepine (TEGRETOL) tablet 400 mg  400 mg  Oral BID Theora Gianotti, NP   400 mg at 03/05/19 2250  . citalopram (CELEXA) tablet 20 mg  20 mg Oral Daily Theora Gianotti, NP   20 mg at 03/05/19 0932  . Difluprednate 0.05 % EMUL 1 drop  1 drop Right Eye TID Theora Gianotti, NP      . ezetimibe (ZETIA) tablet 10 mg  10 mg Oral Daily Herminio Commons, MD       And  . simvastatin (ZOCOR) tablet 40 mg  40 mg Oral Daily Kate Sable A, MD      . furosemide (LASIX) tablet 40 mg  40 mg Oral Daily Furth, Cadence H, PA-C      . heparin injection 5,000 Units  5,000 Units Subcutaneous Q8H Theora Gianotti, NP   5,000 Units at 03/06/19 [phone]  . hydrALAZINE (APRESOLINE) tablet 25 mg  25 mg Oral TID Theora Gianotti, NP   25 mg at 03/05/19 2251  . insulin aspart (novoLOG) injection 0-5 Units  0-5 Units Subcutaneous QHS Daune Perch, NP      . insulin aspart (novoLOG) injection 0-6 Units  0-6 Units Subcutaneous TID WC Daune Perch, NP      . mirtazapine (REMERON) tablet 15 mg  15 mg Oral QHS Theora Gianotti, NP   15 mg at 03/05/19 2251  . nitroGLYCERIN (NITROSTAT) SL tablet 0.4 mg  0.4 mg Sublingual Q5 Min x 3 PRN Theora Gianotti, NP      . ondansetron [facility]) injection 4 mg  4 mg Intravenous Q6H PRN Theora Gianotti, NP      . pantoprazole (PROTONIX) EC tablet 40 mg  40 mg Oral Daily Theora Gianotti, NP   40 mg at 03/05/19 3220  . sodium chloride flush (NS) 0.9 % injection 3 mL  3 mL Intravenous Q12H Theora Gianotti, NP   Stopped at 03/04/19 1621  . sodium chloride flush (NS) 0.9 % injection 3 mL  3 mL Intravenous PRN Theora Gianotti, NP         Discharge Medications: Please see discharge summary for a list of discharge medications.  Relevant Imaging Results:  Relevant Lab Results:   Additional Information SSN: [id number]  Alberteen Sam, LCSW

## 2019-03-06 NOTE — Progress Notes (Signed)
Pharmacy: home medication note  89 you female on difluprednate 0.05% (1 drop tid to the right eye) at home.  She reports she was on this for an eye procedure that has been delayed  Pharmacy does not carry this medication. Timing of the procedure is not known  Plan -Will discontinue difluprednate and have the patient follow up with her procedure  Please contact pharmacy with any concerns  Hildred Laser, PharmD Clinical Pharmacist **Pharmacist phone directory can now be found on [email or web address] ([email or web address]).  Listed under [place].

## 2019-03-06 NOTE — Progress Notes (Signed)
Progress Note  Patient Name: [name] Date of Encounter: 03/06/2019  Primary Cardiologist: Lauree Chandler, MD   Subjective   Patient put out 726mL in the last 24 hours. She is still feeling weak and SOB on exertion. Denies CP.   Inpatient Medications    Scheduled Meds: . amLODipine  10 mg Oral Daily  . aspirin  81 mg Oral Daily  . carbamazepine  400 mg Oral BID  . citalopram  20 mg Oral Daily  . Difluprednate  1 drop Right Eye TID  . ezetimibe  10 mg Oral Daily   And  . simvastatin  40 mg Oral Daily  . furosemide  40 mg Oral Daily  . heparin  5,000 Units Subcutaneous Q8H  . hydrALAZINE  25 mg Oral TID  . insulin aspart  0-5 Units Subcutaneous QHS  . insulin aspart  0-6 Units Subcutaneous TID WC  . mirtazapine  15 mg Oral QHS  . pantoprazole  40 mg Oral Daily  . sodium chloride flush  3 mL Intravenous Q12H   Continuous Infusions: . sodium chloride     PRN Meds: sodium chloride, acetaminophen, nitroGLYCERIN, ondansetron (ZOFRAN) IV, sodium chloride flush   Vital Signs    Vitals:   03/05/19 1132 03/05/19 1723 03/05/19 2209 03/06/19 0326  BP: 131/66 (!) 142/62 121/64 139/72  Pulse: 64 64 70 67  Resp: 20 15  18   Temp: 100 F (37.8 C) 98.4 F (36.9 C) 98.6 F (37 C) 98.8 F (37.1 C)  TempSrc: Oral Oral Oral Oral  SpO2: 97% 95% 100% 100%  Weight:    109.5 kg  Height:        Intake/Output Summary (Last 24 hours) at 03/06/2019 1019 Last data filed at 03/06/2019 0840 Gross per 24 hour  Intake 780 ml  Output --  Net 780 ml   Last 3 Weights 03/06/2019 03/05/2019 03/04/2019  Weight (lbs) 241 lb 6.5 oz 238 lb 15.7 oz 244 lb 4.3 oz  Weight (kg) 109.5 kg 108.4 kg 110.8 kg      Telemetry    NSR HR 60s, transient first degree AV block - Personally Reviewed  ECG     No new- Personally Reviewed  Physical Exam   GEN: No acute distress.   Neck: No JVD Cardiac: RRR, no murmurs, rubs, or gallops.  Respiratory: Clear to auscultation bilaterally. GI: Soft,  nontender, non-distended  MS: No edema; No deformity. Neuro:  Nonfocal  Psych: Normal affect   Labs    High Sensitivity Troponin:   Recent Labs  Lab 03/04/19 0954 03/04/19 1154 03/04/19 1557 03/04/19 1809  TROPONINIHS 483* 495* 456* 431*      Chemistry Recent Labs  Lab 03/02/19 0855 03/02/19 0855 03/04/19 0954 03/04/19 0954 03/04/19 1012 03/04/19 1557 03/05/19 0405 03/06/19 0846  NA 135   < > 137   < > 137  --  139 136  K 4.1   < > 4.0   < > 4.0  --  3.8 4.5  CL 101   < > 103  --   --   --  106 101  CO2 21*   < > 23  --   --   --  21* 25  GLUCOSE 250*   < > 273*  --   --   --  91 192*  BUN 60*   < > 58*  --   --   --  58* 56*  CREATININE 3.43*   < > 3.65*   < >  --  3.45* 3.53* 3.34*  CALCIUM 8.8*   < > 8.4*  --   --   --  8.3* 8.3*  PROT 7.4  --   --   --   --   --   --   --   ALBUMIN 4.0  --   --   --   --   --   --   --   AST 17  --   --   --   --   --   --   --   ALT 13  --   --   --   --   --   --   --   ALKPHOS 101  --   --   --   --   --   --   --   BILITOT 1.0  --   --   --   --   --   --   --   GFRNONAA 13*   < > 12*   < >  --  13* 12* 13*  GFRAA 15*   < > 14*   < >  --  15* 14* 15*  ANIONGAP 13   < > 11  --   --   --  12 10   < > = values in this interval not displayed.     Hematology Recent Labs  Lab 03/04/19 0954 03/04/19 0954 03/04/19 1012 03/04/19 1557 03/06/19 0853  WBC 6.5  --   --  6.0 4.6  RBC 3.12*  --   --  3.04* 2.96*  HGB 9.7*   < > 10.5* 9.6* 9.3*  HCT 29.4*   < > 31.0* 27.5* 28.0*  MCV 94.2  --   --  90.5 94.6  MCH 31.1  --   --  31.6 31.4  MCHC 33.0  --   --  34.9 33.2  RDW 12.1  --   --  11.9 12.1  PLT 185  --   --  182 205   < > = values in this interval not displayed.    BNP Recent Labs  Lab 03/04/19 0954  BNP 1,112.8*     DDimer No results for input(s): DDIMER in the last 168 hours.   Radiology    DG Chest 1 View  Result Date: 03/04/2019 CLINICAL DATA:  Fall and pain. EXAM: CHEST  1 VIEW COMPARISON:   03/02/2019 FINDINGS: Cardiomegaly and elevated RIGHT hemidiaphragm again noted. There is no evidence of focal airspace disease, pulmonary edema, suspicious pulmonary nodule/mass, pleural effusion, or pneumothorax. No acute bony abnormalities are identified. IMPRESSION: Cardiomegaly without evidence of acute cardiopulmonary disease. Electronically Signed   By: Margarette Canada M.D.   On: 03/04/2019 11:03   ECHOCARDIOGRAM COMPLETE  Result Date: 03/05/2019    ECHOCARDIOGRAM REPORT   Patient Name:   [name] Date of Exam: 03/05/2019 Medical Rec #:  [id number]      Height:       62.0 in Accession #:    [id number]     Weight:       239.0 lb Date of Birth:  [birth year]       BSA:          2.06 m Patient Age:    74 years       BP:           122/72 mmHg Patient Gender: F              HR:  64 bpm. Exam Location:  Inpatient Procedure: 2D Echo Indications:    acute diastolic chf 563.87  History:        Patient has prior history of Echocardiogram examinations, most                 recent 05/03/2017. Chronic kidney disease, Signs/Symptoms:elevated                 troponin; Risk Factors:Hypertension, Dyslipidemia, Diabetes and                 Sleep Apnea.  Sonographer:    Johny Chess Referring Phys: [id number] Tangee Marszalek RONALD [place]  1. Left ventricular ejection fraction, by estimation, is 60 to 65%. The left ventricle has normal function. Left ventricular diastolic parameters are consistent with Grade I diastolic dysfunction (impaired relaxation). Normal wall motion. Mild LV hypertrophy.  2. Right ventricular systolic function is moderately reduced. The right ventricular size is mildly enlarged. Tricuspid regurgitation signal is inadequate for assessing RVSP. Would consider PE.  3. The aortic valve is tricuspid. No stenosis or regurgitation.  4. No mitral regurgitation.  5. The IVC was not visualized. FINDINGS  Left Ventricle: Left ventricular ejection fraction, by estimation, is 60 to 65%. The left ventricle  has normal function. The left ventricle has no regional wall motion abnormalities. The left ventricular internal cavity size was normal in size. There is  mildly increased left ventricular hypertrophy. Right Ventricle: The right ventricular size is mildly enlarged. No increase in right ventricular wall thickness. Right ventricular systolic function is moderately reduced. Tricuspid regurgitation signal is inadequate for assessing RVSP. Left Atrium: Left atrial size was normal in size. Right Atrium: Right atrial size was normal in size. Pericardium: Trivial pericardial effusion is present. Mitral Valve: The mitral valve is normal in structure and function. There is mild calcification of the mitral valve leaflet(s). Mild mitral annular calcification. No evidence of mitral valve regurgitation. No evidence of mitral valve stenosis. Tricuspid Valve: The tricuspid valve is normal in structure. Tricuspid valve regurgitation is not demonstrated. Aortic Valve: The aortic valve is tricuspid. Aortic valve regurgitation is not visualized. Mild aortic valve sclerosis is present, with no evidence of aortic valve stenosis. Pulmonic Valve: The pulmonic valve was normal in structure. Pulmonic valve regurgitation is not visualized. Aorta: The aortic root is normal in size and structure. Venous: The inferior vena cava was not well visualized. IAS/Shunts: No atrial level shunt detected by color flow Doppler.  LEFT VENTRICLE PLAX 2D LVIDd:         4.40 cm  Diastology LVIDs:         3.00 cm  LV e' lateral:   7.83 cm/s LV PW:         1.00 cm  LV E/e' lateral: 11.4 LV IVS:        1.00 cm  LV e' medial:    6.42 cm/s LVOT diam:     2.00 cm  LV E/e' medial:  13.9 LV SV:         78.54 ml LV SV Index:   23.51 LVOT Area:     3.14 cm  RIGHT VENTRICLE RV S prime:     9.46 cm/s TAPSE (M-mode): 1.8 cm LEFT ATRIUM             Index       RIGHT ATRIUM           Index LA diam:        4.40 cm 2.13 cm/m  RA  Area:     12.40 cm LA Vol (A2C):   60.7 ml  29.44 ml/m RA Volume:   29.40 ml  14.26 ml/m LA Vol (A4C):   47.3 ml 22.94 ml/m LA Biplane Vol: 54.9 ml 26.63 ml/m  AORTIC VALVE LVOT Vmax:   113.00 cm/s LVOT Vmean:  73.600 cm/s LVOT VTI:    0.250 m  AORTA Ao Root diam: 2.60 cm MITRAL VALVE MV Area (PHT): 3.21 cm              SHUNTS MV Decel Time: 236 msec              Systemic VTI:  0.25 m MV E velocity: 89.30 cm/s  103 cm/s  Systemic Diam: 2.00 cm MV A velocity: 121.00 cm/s 70.3 cm/s MV E/A ratio:  0.74        1.5 Loralie Champagne MD Electronically signed by Loralie Champagne MD Signature Date/Time: 03/05/2019/5:42:33 PM    Final    DG Hip Unilat W or Wo Pelvis 2-3 Views Left  Result Date: 03/04/2019 CLINICAL DATA:  Acute LEFT hip pain following fall. Initial encounter. EXAM: DG HIP (WITH OR WITHOUT PELVIS) 2-3V LEFT COMPARISON:  12/12/2014 and prior studies FINDINGS: No acute fracture, subluxation or dislocation. Joint space narrowing and osteophytosis of both hips again noted. No suspicious focal bony lesions are present. IMPRESSION: No acute abnormality. Electronically Signed   By: Margarette Canada M.D.   On: 03/04/2019 11:06   VAS [place] LOWER EXTREMITY VENOUS (DVT)  Result Date: 03/06/2019  Lower Venous DVTStudy Indications: Edema.  Limitations: Body habitus, poor ultrasound/tissue interface and pain tolerance. Comparison Study: no prior Performing Technologist: Abram Sander RVS  Examination Guidelines: A complete evaluation includes B-mode imaging, spectral Doppler, color Doppler, and power Doppler as needed of all accessible portions of each vessel. Bilateral testing is considered an integral part of a complete examination. Limited examinations for reoccurring indications may be performed as noted. The reflux portion of the exam is performed with the patient in reverse Trendelenburg.  +---------+---------------+---------+-----------+----------+------------------+ RIGHT    CompressibilityPhasicitySpontaneityPropertiesThrombus Aging      +---------+---------------+---------+-----------+----------+------------------+ CFV      Full           Yes      Yes                                     +---------+---------------+---------+-----------+----------+------------------+ SFJ      Full                                                            +---------+---------------+---------+-----------+----------+------------------+ FV Prox  Full                                                            +---------+---------------+---------+-----------+----------+------------------+ FV Mid   Full                                                            +---------+---------------+---------+-----------+----------+------------------+  FV Distal               Yes      Yes                                     +---------+---------------+---------+-----------+----------+------------------+ PFV      Full                                                            +---------+---------------+---------+-----------+----------+------------------+ POP      Full           Yes      Yes                                     +---------+---------------+---------+-----------+----------+------------------+ PTV      Full                                                            +---------+---------------+---------+-----------+----------+------------------+ PERO     Full                                         limited                                                                  visualization      +---------+---------------+---------+-----------+----------+------------------+   +---------+---------------+---------+-----------+----------+--------------+ LEFT     CompressibilityPhasicitySpontaneityPropertiesThrombus Aging +---------+---------------+---------+-----------+----------+--------------+ CFV      Full           Yes      Yes                                  +---------+---------------+---------+-----------+----------+--------------+ SFJ      Full                                                        +---------+---------------+---------+-----------+----------+--------------+ FV Prox  Full                                                        +---------+---------------+---------+-----------+----------+--------------+ FV Mid   Full                                                        +---------+---------------+---------+-----------+----------+--------------+  FV Distal               Yes      Yes                                 +---------+---------------+---------+-----------+----------+--------------+ PFV      Full                                                        +---------+---------------+---------+-----------+----------+--------------+ POP      Full           Yes      Yes                                 +---------+---------------+---------+-----------+----------+--------------+ PTV      Full                                                        +---------+---------------+---------+-----------+----------+--------------+     Summary: BILATERAL: - No evidence of deep vein thrombosis seen in the lower extremities, bilaterally.   *See table(s) above for measurements and observations.    Preliminary     Cardiac Studies   Echo 03/05/19 1. Left ventricular ejection fraction, by estimation, is 60 to 65%. The  left ventricle has normal function. Left ventricular diastolic parameters  are consistent with Grade I diastolic dysfunction (impaired relaxation).  Normal wall motion. Mild LV  hypertrophy.  2. Right ventricular systolic function is moderately reduced. The right  ventricular size is mildly enlarged. Tricuspid regurgitation signal is  inadequate for assessing RVSP. Would consider PE.  3. The aortic valve is tricuspid. No stenosis or regurgitation.  4. No mitral regurgitation.  5. The IVC was not  visualized.   Patient Profile     74 y.o. female with hx of HFpEF, obesity, HTN, HLD, DM, CKD stage IV, bradycardia who is being seen for elevated troponin.  Assessment & Plan    Acute on chronic diastolic HF - Echo in 0865 with normal LVEF, G1DD - BNP elevated to 1112. - Patient was on lasix 40 mg at home - Patient received IV lasix 40 mg in the ED and transitioned to home lasix 40 mg daily. - I/O - 783 mL. She is net positive since admission - weight down 3 lbs since admission - repeat echo showed EF 60-65%, g1DD - creatinine 3.45 > 3.53.AM labs pending.  - After discussion with MD will continue with PO lasix  Elevated troponin - patient denies chest pain - troponin trend is flat 495 > 456 > 431 - continue with aspirin and statin - Further ischemic work-up likely as outpatient  CKD stage IV - creatinine was mildly up on admission. Baseline around 3.4 - initially improved with diuresis - AM labs pending  Fatigue/SOB - Unsure etiology. She does have chronic  Anemia - Echo recommended work-up for possible PE - [place] lower extremity performed showing no DVT B/L. Check D-dimer - PT recommending SNF placement  HTN - continue with home meds amlodipine 10 mg daily and hydralazine  25 mg TID - no BB due to junctional bradycardia - BP stable  DM2 - SSI  - Plan to send out on glipizide  Leg weakness - no hip fx on xray  HLD - continue Vytorin  Anemia, chornic - Hgb 9-10 appears to at baseline   For questions or updates, please contact [facility] Please consult [email or web address] for contact info under        Signed, Donato Heinz, MD  03/06/2019, 10:19 AM    Patient seen and examined.  Agree with above documentation.  On exam, patient is alert and oriented, regular rate and rhythm, no murmurs, lungs CTAB, no LE edema or JVD (though difficult to assess given body habitus).  She reports her dyspnea has improved, but continues to feel unsteady on her feet.  She  was seen by PT/OT, recommending SNF placement.  We will continue oral diuresis.  Lower extremity duplex with no evidence of DVT.  Awaiting SNF placement.  Donato Heinz, MD

## 2019-03-06 NOTE — TOC Initial Note (Addendum)
Transition of Care [facility]) - Initial/Assessment Note    Patient Details  Name: [name] MRN: [id number] Date of Birth: 11-Aug-1945  Transition of Care [facility]) CM/SW Contact:    Alberteen Sam, [place] Phone Number: [phone] 03/06/2019, 9:38 AM  Clinical Narrative:                  CSW spoke with patient's daughter [name] regarding discharge planning. [name] has been patient's caregiver at home, however reports her needs are more than can be managed at home currently and is in agreement with SNF recommendation. She reports preference for [facility] as patient has been there 1 year ago. She agreed for CSW to fax referral to [facility], [place] explained insurance authorization process, [name] expressed understanding.   Insurance auth with [employer] has been started, Reference Number [id number].    Expected Discharge Plan: Skilled Nursing Facility Barriers to Discharge: Continued Medical Work up   Patient Goals and CMS Choice   CMS Medicare.gov Compare Post Acute Care list provided to:: Patient Represenative (must comment)(daughter [name]) Choice offered to / list presented to : Adult Children  Expected Discharge Plan and Services Expected Discharge Plan: [place] Acute Care Choice: [place] Living arrangements for the past 2 months: Single Family Home                                      Prior Living Arrangements/Services Living arrangements for the past 2 months: Single Family Home Lives with:: Adult Children Patient language and need for interpreter reviewed:: Yes Do you feel safe going back to the place where you live?: No   needs short term rehab  Need for Family Participation in Patient Care: Yes (Comment) Care giver support system in place?: Yes (comment)   Criminal Activity/Legal Involvement Pertinent to Current Situation/Hospitalization: No - Comment as needed  Activities of Daily Living Home Assistive Devices/Equipment: Walker  (specify type) ADL Screening (condition at time of admission) Patient's cognitive ability adequate to safely complete daily activities?: No Is the patient deaf or have difficulty hearing?: No Does the patient have difficulty seeing, even when wearing glasses/contacts?: Yes Does the patient have difficulty concentrating, remembering, or making decisions?: No Patient able to express need for assistance with ADLs?: Yes Does the patient have difficulty dressing or bathing?: Yes Independently performs ADLs?: Yes (appropriate for developmental age) Does the patient have difficulty walking or climbing stairs?: Yes Weakness of Legs: Right Weakness of Arms/Hands: Right  Permission Sought/Granted Permission sought to share information with : Case Manager, Customer service manager, Family Supports Permission granted to share information with : Yes, Verbal Permission Granted  Share Information with NAME: [name]  Permission granted to share info w AGENCY: SNFs  Permission granted to share info w Relationship: daughter  Permission granted to share info w Contact Information: [phone]  Emotional Assessment Appearance:: Appears stated age Attitude/Demeanor/Rapport: Unable to Assess Affect (typically observed): Unable to Assess Orientation: : Oriented to Self, Oriented to Place Alcohol / Substance Use: Not Applicable Psych Involvement: No (comment)  Admission diagnosis:  Acute on chronic diastolic CHF (congestive heart failure) ([place]) [I50.33] Patient Active Problem List   Diagnosis Date Noted  . Acute on chronic diastolic CHF (congestive heart failure) ([place]) 03/04/2019  . Acute respiratory failure with hypoxia ([place]) 05/02/2017  . Seizures disorder 05/02/2017  . Diabetes mellitus type 2, insulin dependent ([place]) 05/02/2017  .  Morbid obesity with BMI of 45.0-49.9, adult ([place]) 05/02/2017  . Anemia 05/02/2017  . CKD (chronic kidney disease) stage 4, GFR 15-29 ml/min (H[facility]) 05/02/2017  .  Dyslipidemia 05/02/2017  . Acute hyperkalemia 05/02/2017  . 1st degree AV block 05/02/2017  . Symptomatic bradycardia 11/27/2016  . DVT (deep venous thrombosis) ([place]) 06/30/2015  . Chronic diastolic (congestive) heart failure ([place]) 06/30/2015  . Bradycardia with 31-40 beats per minute   . Emesis   . Junctional bradycardia   . Arterial embolism and thrombosis of upper extremity ([place])   . Bradycardia 12/11/2014  . Vomiting 12/11/2014  . Acute renal failure ([place]) 12/11/2014  . CKD (chronic kidney disease), stage IV ([place])   . Essential hypertension   . Type II diabetes mellitus ([place])   . OSA (obstructive sleep apnea) 10/17/2012  . Elevated troponin 09/26/2012  . Acute encephalopathy 09/25/2012  . Tremor 09/25/2012  . Rhabdomyolysis 09/25/2012  . Morbid obesity ([place]) 09/16/2006  . DEPRESSION, CHRONIC 09/16/2006  . Seizure ([place]) 09/16/2006  . Hyperlipidemia 09/09/2006  . CONSTIPATION, HX OF 09/09/2006   PCP:  Benito Mccreedy, MD Pharmacy:   [place] [place] [address] Phone: [phone] Fax: [phone]  [id number] - [place], [place] - [address] [place] [address] Phone: [phone] Fax: [phone]     Social Determinants of Health (SDOH) Interventions    Readmission Risk Interventions No flowsheet data found.

## 2019-03-06 NOTE — Progress Notes (Addendum)
Progress Note  Patient Name: [name] Date of Encounter: 03/06/2019  Primary Cardiologist: Lauree Chandler, MD   Subjective   Patient put out 771mL in the last 24 hours. She is still feeling weak and SOB on exertion. Denies CP.   Inpatient Medications    Scheduled Meds: . amLODipine  10 mg Oral Daily  . aspirin  81 mg Oral Daily  . carbamazepine  400 mg Oral BID  . citalopram  20 mg Oral Daily  . Difluprednate  1 drop Right Eye TID  . ezetimibe-simvastatin  1 tablet Oral Daily  . furosemide  40 mg Intravenous Once  . furosemide  40 mg Oral Daily  . heparin  5,000 Units Subcutaneous Q8H  . hydrALAZINE  25 mg Oral TID  . insulin aspart  0-5 Units Subcutaneous QHS  . insulin aspart  0-6 Units Subcutaneous TID WC  . mirtazapine  15 mg Oral QHS  . pantoprazole  40 mg Oral Daily  . sodium chloride flush  3 mL Intravenous Q12H   Continuous Infusions: . sodium chloride     PRN Meds: sodium chloride, acetaminophen, nitroGLYCERIN, ondansetron (ZOFRAN) IV, sodium chloride flush   Vital Signs    Vitals:   03/05/19 1132 03/05/19 1723 03/05/19 2209 03/06/19 0326  BP: 131/66 (!) 142/62 121/64 139/72  Pulse: 64 64 70 67  Resp: 20 15  18   Temp: 100 F (37.8 C) 98.4 F (36.9 C) 98.6 F (37 C) 98.8 F (37.1 C)  TempSrc: Oral Oral Oral Oral  SpO2: 97% 95% 100% 100%  Weight:    109.5 kg  Height:        Intake/Output Summary (Last 24 hours) at 03/06/2019 0838 Last data filed at 03/05/2019 2300 Gross per 24 hour  Intake 783 ml  Output --  Net 783 ml   Last 3 Weights 03/06/2019 03/05/2019 03/04/2019  Weight (lbs) 241 lb 6.5 oz 238 lb 15.7 oz 244 lb 4.3 oz  Weight (kg) 109.5 kg 108.4 kg 110.8 kg      Telemetry    NSR HR 60s, transient first degree AV block - Personally Reviewed  ECG     No new- Personally Reviewed  Physical Exam   GEN: No acute distress.   Neck: No JVD Cardiac: RRR, no murmurs, rubs, or gallops.  Respiratory: Clear to auscultation  bilaterally. GI: Soft, nontender, non-distended  MS: No edema; No deformity. Neuro:  Nonfocal  Psych: Normal affect   Labs    High Sensitivity Troponin:   Recent Labs  Lab 03/04/19 0954 03/04/19 1154 03/04/19 1557 03/04/19 1809  TROPONINIHS 483* 495* 456* 431*      Chemistry Recent Labs  Lab 03/02/19 0855 03/02/19 0855 03/04/19 0954 03/04/19 1012 03/04/19 1557 03/05/19 0405  NA 135   < > 137 137  --  139  K 4.1   < > 4.0 4.0  --  3.8  CL 101  --  103  --   --  106  CO2 21*  --  23  --   --  21*  GLUCOSE 250*  --  273*  --   --  91  BUN 60*  --  58*  --   --  58*  CREATININE 3.43*   < > 3.65*  --  3.45* 3.53*  CALCIUM 8.8*  --  8.4*  --   --  8.3*  PROT 7.4  --   --   --   --   --   ALBUMIN  4.0  --   --   --   --   --   AST 17  --   --   --   --   --   ALT 13  --   --   --   --   --   ALKPHOS 101  --   --   --   --   --   BILITOT 1.0  --   --   --   --   --   GFRNONAA 13*   < > 12*  --  13* 12*  GFRAA 15*   < > 14*  --  15* 14*  ANIONGAP 13  --  11  --   --  12   < > = values in this interval not displayed.     Hematology Recent Labs  Lab 03/02/19 0855 03/02/19 0855 03/04/19 0954 03/04/19 1012 03/04/19 1557  WBC 8.5  --  6.5  --  6.0  RBC 3.28*  --  3.12*  --  3.04*  HGB 10.4*   < > 9.7* 10.5* 9.6*  HCT 30.9*   < > 29.4* 31.0* 27.5*  MCV 94.2  --  94.2  --  90.5  MCH 31.7  --  31.1  --  31.6  MCHC 33.7  --  33.0  --  34.9  RDW 12.0  --  12.1  --  11.9  PLT 213  --  185  --  182   < > = values in this interval not displayed.    BNP Recent Labs  Lab 03/04/19 0954  BNP 1,112.8*     DDimer No results for input(s): DDIMER in the last 168 hours.   Radiology    DG Chest 1 View  Result Date: 03/04/2019 CLINICAL DATA:  Fall and pain. EXAM: CHEST  1 VIEW COMPARISON:  03/02/2019 FINDINGS: Cardiomegaly and elevated RIGHT hemidiaphragm again noted. There is no evidence of focal airspace disease, pulmonary edema, suspicious pulmonary nodule/mass,  pleural effusion, or pneumothorax. No acute bony abnormalities are identified. IMPRESSION: Cardiomegaly without evidence of acute cardiopulmonary disease. Electronically Signed   By: Margarette Canada M.D.   On: 03/04/2019 11:03   ECHOCARDIOGRAM COMPLETE  Result Date: 03/05/2019    ECHOCARDIOGRAM REPORT   Patient Name:   [name] Date of Exam: 03/05/2019 Medical Rec #:  [id number]      Height:       62.0 in Accession #:    [id number]     Weight:       239.0 lb Date of Birth:  [birth year]       BSA:          2.06 m Patient Age:    74 years       BP:           122/72 mmHg Patient Gender: F              HR:           64 bpm. Exam Location:  Inpatient Procedure: 2D Echo Indications:    acute diastolic chf 376.28  History:        Patient has prior history of Echocardiogram examinations, most                 recent 05/03/2017. Chronic kidney disease, Signs/Symptoms:elevated                 troponin; Risk Factors:Hypertension, Dyslipidemia, Diabetes and  Sleep Apnea.  Sonographer:    Johny Chess Referring Phys: [id number] CHRISTOPHER RONALD Rudolph  1. Left ventricular ejection fraction, by estimation, is 60 to 65%. The left ventricle has normal function. Left ventricular diastolic parameters are consistent with Grade I diastolic dysfunction (impaired relaxation). Normal wall motion. Mild LV hypertrophy.  2. Right ventricular systolic function is moderately reduced. The right ventricular size is mildly enlarged. Tricuspid regurgitation signal is inadequate for assessing RVSP. Would consider PE.  3. The aortic valve is tricuspid. No stenosis or regurgitation.  4. No mitral regurgitation.  5. The IVC was not visualized. FINDINGS  Left Ventricle: Left ventricular ejection fraction, by estimation, is 60 to 65%. The left ventricle has normal function. The left ventricle has no regional wall motion abnormalities. The left ventricular internal cavity size was normal in size. There is  mildly increased left  ventricular hypertrophy. Right Ventricle: The right ventricular size is mildly enlarged. No increase in right ventricular wall thickness. Right ventricular systolic function is moderately reduced. Tricuspid regurgitation signal is inadequate for assessing RVSP. Left Atrium: Left atrial size was normal in size. Right Atrium: Right atrial size was normal in size. Pericardium: Trivial pericardial effusion is present. Mitral Valve: The mitral valve is normal in structure and function. There is mild calcification of the mitral valve leaflet(s). Mild mitral annular calcification. No evidence of mitral valve regurgitation. No evidence of mitral valve stenosis. Tricuspid Valve: The tricuspid valve is normal in structure. Tricuspid valve regurgitation is not demonstrated. Aortic Valve: The aortic valve is tricuspid. Aortic valve regurgitation is not visualized. Mild aortic valve sclerosis is present, with no evidence of aortic valve stenosis. Pulmonic Valve: The pulmonic valve was normal in structure. Pulmonic valve regurgitation is not visualized. Aorta: The aortic root is normal in size and structure. Venous: The inferior vena cava was not well visualized. IAS/Shunts: No atrial level shunt detected by color flow Doppler.  LEFT VENTRICLE PLAX 2D LVIDd:         4.40 cm  Diastology LVIDs:         3.00 cm  LV e' lateral:   7.83 cm/s LV PW:         1.00 cm  LV E/e' lateral: 11.4 LV IVS:        1.00 cm  LV e' medial:    6.42 cm/s LVOT diam:     2.00 cm  LV E/e' medial:  13.9 LV SV:         78.54 ml LV SV Index:   23.51 LVOT Area:     3.14 cm  RIGHT VENTRICLE RV S prime:     9.46 cm/s TAPSE (M-mode): 1.8 cm LEFT ATRIUM             Index       RIGHT ATRIUM           Index LA diam:        4.40 cm 2.13 cm/m  RA Area:     12.40 cm LA Vol (A2C):   60.7 ml 29.44 ml/m RA Volume:   29.40 ml  14.26 ml/m LA Vol (A4C):   47.3 ml 22.94 ml/m LA Biplane Vol: 54.9 ml 26.63 ml/m  AORTIC VALVE LVOT Vmax:   113.00 cm/s LVOT Vmean:  73.600  cm/s LVOT VTI:    0.250 m  AORTA Ao Root diam: 2.60 cm MITRAL VALVE MV Area (PHT): 3.21 cm              SHUNTS MV Decel Time:  236 msec              Systemic VTI:  0.25 m MV E velocity: 89.30 cm/s  103 cm/s  Systemic Diam: 2.00 cm MV A velocity: 121.00 cm/s 70.3 cm/s MV E/A ratio:  0.74        1.5 Loralie Champagne MD Electronically signed by Loralie Champagne MD Signature Date/Time: 03/05/2019/5:42:33 PM    Final    DG Hip Unilat W or Wo Pelvis 2-3 Views Left  Result Date: 03/04/2019 CLINICAL DATA:  Acute LEFT hip pain following fall. Initial encounter. EXAM: DG HIP (WITH OR WITHOUT PELVIS) 2-3V LEFT COMPARISON:  12/12/2014 and prior studies FINDINGS: No acute fracture, subluxation or dislocation. Joint space narrowing and osteophytosis of both hips again noted. No suspicious focal bony lesions are present. IMPRESSION: No acute abnormality. Electronically Signed   By: Margarette Canada M.D.   On: 03/04/2019 11:06    Cardiac Studies   Echo 03/05/19 1. Left ventricular ejection fraction, by estimation, is 60 to 65%. The  left ventricle has normal function. Left ventricular diastolic parameters  are consistent with Grade I diastolic dysfunction (impaired relaxation).  Normal wall motion. Mild LV  hypertrophy.  2. Right ventricular systolic function is moderately reduced. The right  ventricular size is mildly enlarged. Tricuspid regurgitation signal is  inadequate for assessing RVSP. Would consider PE.  3. The aortic valve is tricuspid. No stenosis or regurgitation.  4. No mitral regurgitation.  5. The IVC was not visualized.   Patient Profile     74 y.o. female with hx of HFpEF, obesity, HTN, HLD, DM, CKD stage IV, bradycardia who is being seen for elevated troponin.  Assessment & Plan    Acute on chronic diastolic HF - Echo in 4481 with normal LVEF, G1DD - BNP elevated to 1112. - Patient was on lasix 40 mg at home - Patient received IV lasix 40 mg in the ED and transitioned to home lasix 40 mg  daily. - I/O - 783 mL. She is net positive since admission - weight down 3 lbs since admission - repeat echo showed EF 60-65%, g1DD - creatinine 3.45 > 3.53.AM labs pending.  - After discussion with MD will continue with PO lasix  Elevated troponin - patient denies chest pain - troponin trend is flat 495 > 456 > 431 - continue with aspirin and statin - Further ischemic work-up likely as outpatient  CKD stage IV - creatinine was mildly up on admission. Baseline around 3.4 - initially improved with diuresis - AM labs pending  Fatigue/SOB - Unsure etiology. She does have chronic  Anemia - Echo recommended work-up for possible PE - [place] lower extremity performed showing no DVT B/L. Check D-dimer - PT recommending SNF placement  HTN - continue with home meds amlodipine 10 mg daily and hydralazine 25 mg TID - no BB due to junctional bradycardia - BP stable  DM2 - SSI  - Plan to send out on glipizide  Leg weakness - no hip fx on xray  HLD - continue Vytorin  Anemia, chornic - Hgb 9-10 appears to at baseline   For questions or updates, please contact [facility] Please consult [email or web address] for contact info under        Signed, Kalai Baca Ninfa Meeker, PA-C  03/06/2019, 8:38 AM

## 2019-03-06 NOTE — Plan of Care (Signed)
  Problem: Education: Goal: Knowledge of General Education information will improve Description: Including pain rating scale, medication(s)/side effects and non-pharmacologic comfort measures Outcome: Progressing   Problem: Health Behavior/Discharge Planning: Goal: Ability to manage health-related needs will improve Outcome: Progressing   Problem: Clinical Measurements: Goal: Ability to maintain clinical measurements within normal limits will improve Outcome: Progressing Goal: Will remain free from infection Outcome: Progressing Goal: Cardiovascular complication will be avoided Outcome: Progressing   Problem: Activity: Goal: Risk for activity intolerance will decrease Outcome: Progressing   Problem: Nutrition: Goal: Adequate nutrition will be maintained Outcome: Progressing   Problem: Coping: Goal: Level of anxiety will decrease Outcome: Progressing

## 2019-03-06 NOTE — Progress Notes (Signed)
Lower extremity venous has been completed.   Preliminary results in CV Proc.   Abram Sander 03/06/2019 8:41 AM

## 2019-03-06 NOTE — Progress Notes (Signed)
Physical Therapy Treatment Patient Details Name: JAHANNA RAETHER MRN: [id number] DOB: 20-Jan-1946 Today's Date: 03/06/2019    History of Present Illness Patient is a 74 y/o female who presents with dyspnea on exertion and multiple falls at home. Admitted with acute on chronic CHF. PMH includes CHF, thoracic scoliosis, CKD stage 4, obesity, HTN, DVT, seizures, DM.    PT Comments    Pt progressing with functional mobility and able to ambulate longer distance today. Pt does require encouragement for ambulation as she is fearful of falling and with hx of falls. Pt ambulated x 12 ft with RW and min assist +2 for w/c follow for safety. Pt performed stand pivot to recliner min assist, does require occasional mod assist today for boost up to standing. Discussed goal of walking further next session and also provided education on OOB activity. Will continue to follow acutely to progress gait and mobility, recommend SNF level follow up therapy to address impairments in mobility and maximize functional independence.    Follow Up Recommendations  SNF;Supervision for mobility/OOB     Equipment Recommendations  None recommended by PT    Recommendations for Other Services       Precautions / Restrictions Precautions Precautions: Fall Precaution Comments: 3 falls prior to admission Restrictions Weight Bearing Restrictions: No    Mobility  Bed Mobility Overal bed mobility: Needs Assistance Bed Mobility: Rolling;Sidelying to Sit Rolling: Min guard Sidelying to sit: Mod assist;HOB elevated       General bed mobility comments: Able to bring LEs to EOB, assist with trunk to get upright and scoot bottom.  Transfers Overall transfer level: Needs assistance Equipment used: Rolling walker (2 wheeled) Transfers: Sit to/from [facility] Sit to Stand: Min assist;Mod assist Stand pivot transfers: Min assist       General transfer comment: min-mod assist for multiple sit<>stands  throughout session, cues for proper techniques, performed x 3 sit<>stands. Once standing pt able to complete stand pivot to recliner with min assist.  Ambulation/Gait Ambulation/Gait assistance: Min assist;+2 safety/equipment Gait Distance (Feet): 12 Feet Assistive device: Rolling walker (2 wheeled) Gait Pattern/deviations: Shuffle;Wide base of support Gait velocity: decreased Gait velocity interpretation: <1.31 ft/sec, indicative of household ambulator General Gait Details: pt very fearful of ambulation (hx of falls and pt report of knees giving out), used chair follow for safety and to encourage pt to ambulate. Ambulated x 12 ft today with min assist and +2 for chair follow.   Stairs             Wheelchair Mobility    Modified Rankin (Stroke Patients Only)       Balance Overall balance assessment: Needs assistance;History of Falls Sitting-balance support: Feet supported;Single extremity supported Sitting balance-Leahy Scale: Fair Sitting balance - Comments: Supervision for safety.   Standing balance support: During functional activity Standing balance-Leahy Scale: Poor Standing balance comment: Requires UE support and Min A for dynamic tasks. Performed marching in place today with UE support on RW to work on dynamic balance.                            Cognition Arousal/Alertness: Awake/alert Behavior During Therapy: WFL for tasks assessed/performed Overall Cognitive Status: Within Functional Limits for tasks assessed                                 General Comments: for basic mobility tasks  Exercises      General Comments General comments (skin integrity, edema, etc.): SpO2 90-95 % with activity on RA, HR in 80s bpm, increased work of breathing      Pertinent Vitals/Pain Pain Assessment: Faces Faces Pain Scale: Hurts a little bit Pain Location: left hip/buttock Pain Descriptors / Indicators: Sore;Aching Pain Intervention(s):  Monitored during session;Limited activity within patient's tolerance;Repositioned    Home Living                      Prior Function            PT Goals (current goals can now be found in the care plan section) Progress towards PT goals: Progressing toward goals    Frequency    Min 3X/week      PT Plan Current plan remains appropriate    Co-evaluation              AM-PAC PT "6 Clicks" Mobility   Outcome Measure  Help needed turning from your back to your side while in a flat bed without using bedrails?: A Little Help needed moving from lying on your back to sitting on the side of a flat bed without using bedrails?: A Lot Help needed moving to and from a bed to a chair (including a wheelchair)?: A Little Help needed standing up from a chair using your arms (e.g., wheelchair or bedside chair)?: A Lot Help needed to walk in hospital room?: A Little Help needed climbing 3-5 steps with a railing? : A Lot 6 Click Score: 15    End of Session Equipment Utilized During Treatment: Gait belt Activity Tolerance: Patient tolerated treatment well Patient left: in chair;with call bell/phone within reach;with chair alarm set Nurse Communication: Mobility status PT Visit Diagnosis: Pain;Muscle weakness (generalized) (M62.81);Unsteadiness on feet (R26.81);Difficulty in walking, not elsewhere classified (R26.2) Pain - Right/Left: Left Pain - part of body: Hip     Time: 0388-8280 PT Time Calculation (min) (ACUTE ONLY): 19 min  Charges:  $Therapeutic Activity: 8-22 mins                     Netta Corrigan, PT, DPT, [facility] [place] 03/06/2019, 4:33 PM

## 2019-03-06 NOTE — Consult Note (Signed)
   [facility] CM Inpatient Consult   03/06/2019  [name] 05-26-1945 [id number]   Patient screened for high risk score for unplanned readmission score  and for hospitalizations to check if potential [facility] services are needed.  Review of patient's medical record reveals patient is being recommended for skilled nursing [SNF] level of care for rehab.  Patient with [employer] in [place].  Primary Care Provider is Almedia Balls is listed  Plan:  Review of inpatient [facility] team notes reveals that the patient is for a skilled nursing facility level of care.   Continue to follow progress and final disposition to assess for post hospital care management needs.  If patient goes to a SNF  patient's transitional needs will not be followed by [facility].  Please place a [facility] Care Management consult as appropriate and for questions contact:   Natividad Brood, RN BSN [facility] Liaison  [phone] business mobile phone Toll free office [phone]  Fax number: [phone] [email or web address]

## 2019-03-06 NOTE — Plan of Care (Signed)
  Problem: Activity: Goal: Risk for activity intolerance will decrease Outcome: Progressing   Problem: Safety: Goal: Ability to remain free from injury will improve Outcome: Progressing   Problem: Activity: Goal: Capacity to carry out activities will improve Outcome: Progressing   

## 2019-03-07 ENCOUNTER — Inpatient Hospital Stay (HOSPITAL_COMMUNITY): Payer: Medicare Other

## 2019-03-07 ENCOUNTER — Encounter (HOSPITAL_COMMUNITY)
Admission: EM | Disposition: A | Discharge status: Home / Self Care | Payer: Self-pay | Source: Home / Self Care | Attending: Cardiovascular Disease

## 2019-03-07 ENCOUNTER — Inpatient Hospital Stay (HOSPITAL_COMMUNITY): Payer: Medicare Other | Admitting: Certified Registered"

## 2019-03-07 DIAGNOSIS — I5033 Acute on chronic diastolic (congestive) heart failure: Secondary | ICD-10-CM

## 2019-03-07 DIAGNOSIS — I639 Cerebral infarction, unspecified: Secondary | ICD-10-CM

## 2019-03-07 DIAGNOSIS — J9600 Acute respiratory failure, unspecified whether with hypoxia or hypercapnia: Secondary | ICD-10-CM

## 2019-03-07 DIAGNOSIS — I6602 Occlusion and stenosis of left middle cerebral artery: Secondary | ICD-10-CM | POA: Diagnosis present

## 2019-03-07 DIAGNOSIS — I10 Essential (primary) hypertension: Secondary | ICD-10-CM

## 2019-03-07 DIAGNOSIS — N184 Chronic kidney disease, stage 4 (severe): Secondary | ICD-10-CM

## 2019-03-07 HISTORY — PX: RADIOLOGY WITH ANESTHESIA: SHX6223

## 2019-03-07 HISTORY — PX: IR CT HEAD LTD: IMG2386

## 2019-03-07 HISTORY — PX: IR PERCUTANEOUS ART THROMBECTOMY/INFUSION INTRACRANIAL INC DIAG ANGIO: IMG6087

## 2019-03-07 LAB — POCT I-STAT 7, (LYTES, BLD GAS, ICA,H+H)
Acid-base deficit: 5 mmol/L — ABNORMAL HIGH (ref 0.0–2.0)
Acid-base deficit: 6 mmol/L — ABNORMAL HIGH (ref 0.0–2.0)
Bicarbonate: 21.8 mmol/L (ref 20.0–28.0)
Bicarbonate: 19.4 mmol/L — ABNORMAL LOW (ref 20.0–28.0)
Calcium, Ion: 1.16 mmol/L (ref 1.15–1.40)
Calcium, Ion: 1.16 mmol/L (ref 1.15–1.40)
HCT: 27 % — ABNORMAL LOW (ref 36.0–46.0)
HCT: 26 % — ABNORMAL LOW (ref 36.0–46.0)
Hemoglobin: 9.2 g/dL — ABNORMAL LOW (ref 12.0–15.0)
Hemoglobin: 8.8 g/dL — ABNORMAL LOW (ref 12.0–15.0)
O2 Saturation: 99 %
O2 Saturation: 97 %
Patient temperature: 99.4
Patient temperature: 98.8
Potassium: 4.2 mmol/L (ref 3.5–5.1)
Potassium: 4.2 mmol/L (ref 3.5–5.1)
Sodium: 136 mmol/L (ref 135–145)
Sodium: 135 mmol/L (ref 135–145)
TCO2: 23 mmol/L (ref 22–32)
TCO2: 21 mmol/L — ABNORMAL LOW (ref 22–32)
pCO2 arterial: 48.7 mmHg — ABNORMAL HIGH (ref 32.0–48.0)
pCO2 arterial: 36.1 mmHg (ref 32.0–48.0)
pH, Arterial: 7.262 — ABNORMAL LOW (ref 7.350–7.450)
pH, Arterial: 7.34 — ABNORMAL LOW (ref 7.350–7.450)
pO2, Arterial: 145 mmHg — ABNORMAL HIGH (ref 83.0–108.0)
pO2, Arterial: 96 mmHg (ref 83.0–108.0)

## 2019-03-07 LAB — CULTURE, BLOOD (ROUTINE X 2)
Culture: NO GROWTH
Special Requests: ADEQUATE

## 2019-03-07 LAB — BASIC METABOLIC PANEL
Anion gap: 9 (ref 5–15)
BUN: 52 mg/dL — ABNORMAL HIGH (ref 8–23)
CO2: 23 mmol/L (ref 22–32)
Calcium: 8.4 mg/dL — ABNORMAL LOW (ref 8.9–10.3)
Chloride: 104 mmol/L (ref 98–111)
Creatinine, Ser: 3.17 mg/dL — ABNORMAL HIGH (ref 0.44–1.00)
GFR calc Af Amer: 16 mL/min — ABNORMAL LOW (ref >60)
GFR calc non Af Amer: 14 mL/min — ABNORMAL LOW (ref >60)
Glucose, Bld: 125 mg/dL — ABNORMAL HIGH (ref 70–99)
Potassium: 4.5 mmol/L (ref 3.5–5.1)
Sodium: 136 mmol/L (ref 135–145)

## 2019-03-07 LAB — GLUCOSE, CAPILLARY
Glucose-Capillary: 112 mg/dL — ABNORMAL HIGH (ref 70–99)
Glucose-Capillary: 143 mg/dL — ABNORMAL HIGH (ref 70–99)
Glucose-Capillary: 182 mg/dL — ABNORMAL HIGH (ref 70–99)
Glucose-Capillary: 152 mg/dL — ABNORMAL HIGH (ref 70–99)
Glucose-Capillary: 131 mg/dL — ABNORMAL HIGH (ref 70–99)

## 2019-03-07 LAB — MRSA PCR SCREENING: MRSA by PCR: NEGATIVE

## 2019-03-07 LAB — ECHOCARDIOGRAM LIMITED
Height: 62 in
Weight: 3880.1 oz

## 2019-03-07 LAB — TRIGLYCERIDES: Triglycerides: 140 mg/dL (ref <150)

## 2019-03-07 SURGERY — IR WITH ANESTHESIA
Anesthesia: General

## 2019-03-07 MED ORDER — AMLODIPINE BESYLATE 10 MG PO TABS: 10.0000 mg | ORAL_TABLET | Freq: Every day | ORAL | Status: DC

## 2019-03-07 MED ORDER — CLOPIDOGREL BISULFATE 300 MG PO TABS
ORAL_TABLET | ORAL | Status: AC
  Filled 2019-03-07: qty 1

## 2019-03-07 MED ORDER — EPHEDRINE SULFATE 50 MG/ML IJ SOLN
INTRAMUSCULAR | Status: DC | PRN
  Administered 2019-03-07: 10 mg via INTRAVENOUS

## 2019-03-07 MED ORDER — LIDOCAINE 2% (20 MG/ML) 5 ML SYRINGE
INTRAMUSCULAR | Status: DC | PRN
  Administered 2019-03-07: 60 mg via INTRAVENOUS

## 2019-03-07 MED ORDER — ROCURONIUM BROMIDE 100 MG/10ML IV SOLN
INTRAVENOUS | Status: DC | PRN
  Administered 2019-03-07: 60 mg via INTRAVENOUS

## 2019-03-07 MED ORDER — HYDRALAZINE HCL 25 MG PO TABS: 25.0000 mg | ORAL_TABLET | Freq: Three times a day (TID) | ORAL | Status: DC

## 2019-03-07 MED ORDER — SUGAMMADEX SODIUM 200 MG/2ML IV SOLN
INTRAVENOUS | Status: DC | PRN
  Administered 2019-03-07: 200 mg via INTRAVENOUS

## 2019-03-07 MED ORDER — FUROSEMIDE 40 MG PO TABS
40.0000 mg | ORAL_TABLET | Freq: Every day | ORAL | Status: DC
  Administered 2019-03-08 – 2019-03-09 (×2): 40 mg
  Filled 2019-03-07 (×2): qty 1

## 2019-03-07 MED ORDER — ACETAMINOPHEN 325 MG PO TABS: 650.0000 mg | ORAL_TABLET | ORAL | Status: DC | PRN

## 2019-03-07 MED ORDER — CHLORHEXIDINE GLUCONATE CLOTH 2 % EX PADS
6.0000 | MEDICATED_PAD | Freq: Every day | CUTANEOUS | Status: DC
  Administered 2019-03-07 – 2019-03-14 (×8): 6 via TOPICAL

## 2019-03-07 MED ORDER — MIRTAZAPINE 15 MG PO TABS: 15.0000 mg | ORAL_TABLET | Freq: Every day | ORAL | Status: DC

## 2019-03-07 MED ORDER — CLEVIDIPINE BUTYRATE 0.5 MG/ML IV EMUL
0.0000 mg/h | INTRAVENOUS | Status: DC
  Administered 2019-03-07: 20 mg/h via INTRAVENOUS
  Administered 2019-03-07: 10 mg/h via INTRAVENOUS
  Administered 2019-03-07: 12:00:00 1 mg/h via INTRAVENOUS
  Administered 2019-03-07: 22:00:00 15 mg/h via INTRAVENOUS
  Administered 2019-03-08: 19 mg/h via INTRAVENOUS
  Filled 2019-03-07 (×5): qty 50

## 2019-03-07 MED ORDER — EZETIMIBE 10 MG PO TABS: 10.0000 mg | ORAL_TABLET | Freq: Every day | ORAL | Status: DC

## 2019-03-07 MED ORDER — SUCCINYLCHOLINE CHLORIDE 20 MG/ML IJ SOLN
INTRAMUSCULAR | Status: DC | PRN
  Administered 2019-03-07: 140 mg via INTRAVENOUS

## 2019-03-07 MED ORDER — ORAL CARE MOUTH RINSE
15.0000 mL | OROMUCOSAL | Status: DC
  Administered 2019-03-08 (×4): 15 mL via OROMUCOSAL

## 2019-03-07 MED ORDER — NITROGLYCERIN 1 MG/10 ML FOR IR/CATH LAB
INTRA_ARTERIAL | Status: AC
  Filled 2019-03-07: qty 10

## 2019-03-07 MED ORDER — TIROFIBAN HCL IN NACL 5-0.9 MG/100ML-% IV SOLN
INTRAVENOUS | Status: AC
  Filled 2019-03-07: qty 100

## 2019-03-07 MED ORDER — ACETAMINOPHEN 650 MG RE SUPP: 650.0000 mg | RECTAL | Status: DC | PRN

## 2019-03-07 MED ORDER — CHLORHEXIDINE GLUCONATE 0.12% ORAL RINSE (MEDLINE KIT)
15.0000 mL | Freq: Two times a day (BID) | OROMUCOSAL | Status: DC
  Administered 2019-03-07 – 2019-03-08 (×2): 15 mL via OROMUCOSAL

## 2019-03-07 MED ORDER — FENTANYL CITRATE (PF) 100 MCG/2ML IJ SOLN: 25.0000 ug | INTRAMUSCULAR | Status: DC | PRN

## 2019-03-07 MED ORDER — GLYCOPYRROLATE 0.2 MG/ML IJ SOLN
INTRAMUSCULAR | Status: DC | PRN
  Administered 2019-03-07: .2 mg via INTRAVENOUS

## 2019-03-07 MED ORDER — CEFAZOLIN SODIUM-DEXTROSE 2-4 GM/100ML-% IV SOLN
INTRAVENOUS | Status: AC
  Filled 2019-03-07: qty 100

## 2019-03-07 MED ORDER — CLEVIDIPINE BUTYRATE 0.5 MG/ML IV EMUL
INTRAVENOUS | Status: AC
  Filled 2019-03-07: qty 50

## 2019-03-07 MED ORDER — SODIUM CHLORIDE 0.9 % IV SOLN: INTRAVENOUS | Status: DC

## 2019-03-07 MED ORDER — EPTIFIBATIDE 20 MG/10ML IV SOLN
INTRAVENOUS | Status: AC
  Filled 2019-03-07: qty 10

## 2019-03-07 MED ORDER — PROPOFOL 10 MG/ML IV BOLUS
INTRAVENOUS | Status: DC | PRN
  Administered 2019-03-07: 70 mg via INTRAVENOUS

## 2019-03-07 MED ORDER — LACTATED RINGERS IV SOLN: INTRAVENOUS | Status: DC | PRN

## 2019-03-07 MED ORDER — ASPIRIN 81 MG PO CHEW
CHEWABLE_TABLET | ORAL | Status: AC
  Filled 2019-03-07: qty 1

## 2019-03-07 MED ORDER — SIMVASTATIN 20 MG PO TABS: 40.0000 mg | ORAL_TABLET | Freq: Every day | ORAL | Status: DC

## 2019-03-07 MED ORDER — PHENYLEPHRINE HCL-NACL 10-0.9 MG/250ML-% IV SOLN
INTRAVENOUS | Status: DC | PRN
  Administered 2019-03-07: 25 ug/min via INTRAVENOUS

## 2019-03-07 MED ORDER — PROPOFOL 500 MG/50ML IV EMUL
INTRAVENOUS | Status: DC | PRN
  Administered 2019-03-07: 25 ug/kg/min via INTRAVENOUS

## 2019-03-07 MED ORDER — PANTOPRAZOLE SODIUM 40 MG PO PACK
40.0000 mg | PACK | Freq: Every day | ORAL | Status: DC
  Administered 2019-03-07: 22:00:00 40 mg
  Filled 2019-03-07: qty 20

## 2019-03-07 MED ORDER — FENTANYL CITRATE (PF) 100 MCG/2ML IJ SOLN
INTRAMUSCULAR | Status: DC | PRN
  Administered 2019-03-07: 100 ug via INTRAVENOUS

## 2019-03-07 MED ORDER — PERFLUTREN LIPID MICROSPHERE
1.0000 mL | INTRAVENOUS | Status: AC | PRN
  Administered 2019-03-07 (×2): 2 mL via INTRAVENOUS
  Filled 2019-03-07: qty 10

## 2019-03-07 MED ORDER — TICAGRELOR 90 MG PO TABS
ORAL_TABLET | ORAL | Status: AC
  Filled 2019-03-07: qty 2

## 2019-03-07 MED ORDER — CARBAMAZEPINE 200 MG PO TABS
400.0000 mg | ORAL_TABLET | Freq: Two times a day (BID) | ORAL | Status: DC
  Administered 2019-03-07 – 2019-03-08 (×3): 400 mg
  Filled 2019-03-07 (×2): qty 2

## 2019-03-07 MED ORDER — PROPOFOL 1000 MG/100ML IV EMUL
5.0000 ug/kg/min | INTRAVENOUS | Status: DC
  Administered 2019-03-07 (×3): 35 ug/kg/min via INTRAVENOUS
  Administered 2019-03-07: 12:00:00 50 ug/kg/min via INTRAVENOUS
  Administered 2019-03-08: 5 ug/kg/min via INTRAVENOUS
  Filled 2019-03-07 (×4): qty 100

## 2019-03-07 MED ORDER — PANTOPRAZOLE SODIUM 40 MG IV SOLR: 40.0000 mg | Freq: Every day | INTRAVENOUS | Status: DC

## 2019-03-07 MED ORDER — IOHEXOL 300 MG/ML  SOLN
150.0000 mL | Freq: Once | INTRAMUSCULAR | Status: AC | PRN
  Administered 2019-03-07: 09:00:00 50 mL via INTRA_ARTERIAL

## 2019-03-07 MED ORDER — ACETAMINOPHEN 160 MG/5ML PO SOLN: 650.0000 mg | ORAL | Status: DC | PRN

## 2019-03-07 MED ORDER — IOHEXOL 350 MG/ML SOLN
80.0000 mL | Freq: Once | INTRAVENOUS | Status: AC | PRN
  Administered 2019-03-07: 08:00:00 80 mL via INTRAVENOUS

## 2019-03-07 MED ORDER — FENTANYL CITRATE (PF) 100 MCG/2ML IJ SOLN
25.0000 ug | INTRAMUSCULAR | Status: DC | PRN
  Administered 2019-03-07: 12:00:00 100 ug via INTRAVENOUS
  Filled 2019-03-07: qty 2

## 2019-03-07 MED ORDER — CEFAZOLIN SODIUM-DEXTROSE 2-3 GM-%(50ML) IV SOLR
INTRAVENOUS | Status: DC | PRN
  Administered 2019-03-07: 2 g via INTRAVENOUS

## 2019-03-07 MED ORDER — STROKE: EARLY STAGES OF RECOVERY BOOK
Freq: Once | Status: DC
  Filled 2019-03-07 (×2): qty 1

## 2019-03-07 NOTE — Progress Notes (Signed)
Cuff pressure and arterial pressure not correlating.  CCM notified.  Use arterial pressure for blood pressure per CCM.

## 2019-03-07 NOTE — Significant Event (Signed)
Rapid Response Event Note  Overview: Time Called: 0721 Arrival Time: 0725 Event Type: Neurologic  Initial Focused Assessment: Patient Last known well at 0615 this am by bedside staff.  About 0720 during shift report RN discovered that the patient was not speaking and her face was "twisted".  NIHSS 25 Right hemiplegia, right facial droop, mute, left gaze, right neglect, no blink to threat.    Interventions: Code Stroke activated Stat head CT done:  Subdural Hematoma on right Dr Aroor at bedside to assess patient. BP 119/45  126/65 SR 61  RR 20  O2 sat 92% on RA CTA done:  Left M1 occlusion IR activation NS bolus infusing Patient transported to IR Hand off with IR staff    Plan of Care (if not transferred):  Event Summary: Name of Physician Notified: cardiology at [phone]  Name of Consulting Physician Notified: Dr Aroor at [phone]  Outcome: Transferred (Comment)  Event End Time: 0102  Raliegh Ip

## 2019-03-07 NOTE — Progress Notes (Signed)
  Echocardiogram 2D Echocardiogram limited with definity has been performed.  Darlina Sicilian M 03/07/2019, 11:21 AM

## 2019-03-07 NOTE — Progress Notes (Signed)
The chaplain visited after observing the family.  The chaplain briefly sat with the patients sister and prayed for the patient.  The chaplain does not need to follow-up.  Brion Aliment Chaplain Resident For questions concerning this note please contact me by pager [phone]

## 2019-03-07 NOTE — Anesthesia Procedure Notes (Addendum)
Procedure Name: Intubation Date/Time: 03/07/2019 8:22 AM Performed by: Lavell Luster, CRNA Pre-anesthesia Checklist: Patient identified, Emergency Drugs available, Suction available, Patient being monitored and Timeout performed Patient Re-evaluated:Patient Re-evaluated prior to induction Oxygen Delivery Method: Circle system utilized Preoxygenation: Pre-oxygenation with 100% oxygen Induction Type: IV induction, Cricoid Pressure applied and Rapid sequence Laryngoscope Size: Mac and 3 Grade View: Grade I Tube type: Oral Tube size: 7.5 mm Number of attempts: 1 Airway Equipment and Method: Stylet and Video-laryngoscopy Placement Confirmation: ETT inserted through vocal cords under direct vision,  positive ETCO2 and breath sounds checked- equal and bilateral Secured at: 21 cm Tube secured with: Tape Dental Injury: Teeth and Oropharynx as per pre-operative assessment

## 2019-03-07 NOTE — Sedation Documentation (Signed)
Right groin sheath removed. Quick clot applied 

## 2019-03-07 NOTE — Procedures (Signed)
S/P Lt common carotid arteriogram followed by complete revascularization  Of occluded Lt LMCA M 1 seg with x 1 pass woith 53mmx 40 mm  Solitaire X retriever device with penumbra aspiration  Achieving a TICI 3 revascularization. S.Ladarien Beeks MD.

## 2019-03-07 NOTE — Consult Note (Addendum)
Chief Complaint: Aphasia, right side weakness  History obtained from: Patient's nurse and Chart    HPI:                                                                                                                                       [name] is a 74 y.o. female with past medical history of with past medical history of chronic diastolic heart failure, diabetes mellitus, hypertension, CKD stage IV, morbid obesity, obstructive sleep apnea, first-degree AV block admitted to [facility] cardiology service for acute on chronic heart failure 2 days ago.  Patient was her normal self this morning and had 6:15 AM, around 7:15 AM when the nurses went to check on her she was noted to be nonverbal, paralyzed on the right side with gaze preference to the left.  Code stroke was activated and patient was immediately taken to CT scan.  On assessment, NIH stroke scale was 25.  Stat CT head was obtained which showed a right subdural hemorrhage.  CT angiogram showed a left M1 occlusion and patient was taken to mechanical thrombectomy after obtaining consent from daughter.  Patient's baseline prior to admission was she was living at home, independent for most ADLs.  Needed a walker to walk due to shortness of breath after a few steps.  She had a few falls prior to admission which likely explains right-sided acute subdural hemorrhage.   Date last known well: 2.10. 2021 Time last known well: 6:15 AM tPA Given: no, subdural hemorrhage NIHSS: 25 Baseline MRS 2     Past Medical History:  Diagnosis Date  . 1st degree AV block 05/02/2017  . Acute encephalopathy 09/25/2012  . Acute hyperkalemia 05/02/2017  . Acute renal failure ([place]) 12/11/2014  . Acute respiratory failure with hypoxia ([place]) 05/02/2017  . Anemia   . Arterial embolism and thrombosis of upper extremity ([place])   . Chronic diastolic (congestive) heart failure ([place])    a. 04/2017 Echo: EF 60-65%. Gr1 DD. Mod LVH.   . CKD (chronic kidney  disease) stage 4, GFR 15-29 ml/min (HCC) 05/02/2017  . Depression   . Diabetes mellitus type 2, insulin dependent ([place]) 05/02/2017  . DVT (deep venous thrombosis) ([place])   . Dyslipidemia 05/02/2017  . Essential hypertension   . GERD (gastroesophageal reflux disease)   . HTN (hypertension) 05/02/2017  . Hyperlipidemia   . Junctional bradycardia   . Morbid obesity with BMI of 45.0-49.9, adult ([place]) 05/02/2017  . OSA (obstructive sleep apnea) 10/17/2012  . Rhabdomyolysis 09/25/2012  . Seizures ([place])    "epilepsy; they think I had one a couple months ago" (12/11/2014)  . Symptomatic bradycardia 11/27/2016  . Type II diabetes mellitus ([place])     Past Surgical History:  Procedure Laterality Date  . THROMBECTOMY BRACHIAL ARTERY Left 12/15/2014   Procedure: THROMBECTOMY BRACHIAL ARTERY; VEIN PATCH ANGIOPLASTY LEFT BRACIAL ARTERY;  Surgeon: Harrell Gave  Nicole Cella, MD;  Location: [facility];  Service: Vascular;  Laterality: Left;  . TUBAL LIGATION      Family History  Problem Relation Age of Onset  . Hypertension Mother        died @ 28 - unknown cause.  [name] Hypertension Father        says she doesn't know anything about her father.  . Cancer Maternal Aunt   . Seizures Neg Hx    Social History:  reports that she has never smoked. She has never used smokeless tobacco. She reports that she does not drink alcohol or use drugs.  Allergies:  Allergies  Allergen Reactions  . Codeine Nausea And Vomiting  . Metoprolol Other (See Comments)    Severe bradycardia.  Now getting admitted for 3rd time in 3 years for bradycardia onset after being started on toprol.  Please dont put patient on this med anymore!    Medications:                                                                                                                        I reviewed home medications.   ROS:                                                                                                                                     Unable  to obtain due to patient's mental status   Examination:                                                                                                      General: Appears well-developed, obese Psych: Affect appropriate to situation Eyes: No scleral injection HENT: No OP obstrucion Head: Normocephalic.  Cardiovascular: Normal rate and regular rhythm.  Respiratory: Effort normal and breath sounds normal to anterior ascultation GI: Soft.  No distension. There is no tenderness.  Skin: WDI    Neurological Examination Mental Status: Somnolent, nonverbal and globally aphasic.  Not following any commands. Cranial Nerves:  II: Visual fields: No blink to threat in the right eye III,IV, VI: ptosis not present, forced gaze deviation to the left, pupils equal, round, reactive to light and accommodation V,VII: smile symmetric, facial light touch sensation normal bilaterally VIII: hearing normal bilaterally IX,X: uvula rises symmetrically XI: bilateral shoulder shrug XII: midline tongue extension Motor: Right : Upper extremity   0/5    Left:     Upper extremity   4/5  Lower extremity   1/5     Lower extremity   4/5 Tone and bulk:normal tone throughout; no atrophy noted Sensory: Reduced sensation on the right side, does not withdraw any pain Deep Tendon Reflexes: Plantars: Right: downgoing   Left: downgoing Cerebellar: Unable to perform      Lab Results: Basic Metabolic Panel: Recent Labs  Lab 03/02/19 0855 03/02/19 0855 03/04/19 0954 03/04/19 0954 03/04/19 1012 03/04/19 1557 03/05/19 0405 03/06/19 0846 03/07/19 0542  NA 135   < > 137  --  137  --  139 136 136  K 4.1   < > 4.0  --  4.0  --  3.8 4.5 4.5  CL 101  --  103  --   --   --  106 101 104  CO2 21*  --  23  --   --   --  21* 25 23  GLUCOSE 250*  --  273*  --   --   --  91 192* 125*  BUN 60*  --  58*  --   --   --  58* 56* 52*  CREATININE 3.43*   < > 3.65*  --   --  3.45* 3.53* 3.34* 3.17*  CALCIUM 8.8*   < > 8.4*    < >  --   --  8.3* 8.3* 8.4*   < > = values in this interval not displayed.    CBC: Recent Labs  Lab 03/02/19 0855 03/04/19 0954 03/04/19 1012 03/04/19 1557 03/06/19 0853  WBC 8.5 6.5  --  6.0 4.6  NEUTROABS 7.2 5.3  --   --   --   HGB 10.4* 9.7* 10.5* 9.6* 9.3*  HCT 30.9* 29.4* 31.0* 27.5* 28.0*  MCV 94.2 94.2  --  90.5 94.6  PLT 213 185  --  182 205    Coagulation Studies: No results for input(s): LABPROT, INR in the last 72 hours.  Imaging: CT ANGIO HEAD W OR WO CONTRAST  Result Date: 03/07/2019 CLINICAL DATA:  Recent fall. Acute onset of aphasia, gaze deviation, and right-sided hemiplegia. Last seen normal 1 hour ago. EXAM: CT ANGIOGRAPHY HEAD AND NECK TECHNIQUE: Multidetector CT imaging of the head and neck was performed using the standard protocol during bolus administration of intravenous contrast. Multiplanar CT image reconstructions and MIPs were obtained to evaluate the vascular anatomy. Carotid stenosis measurements (when applicable) are obtained utilizing NASCET criteria, using the distal internal carotid diameter as the denominator. CONTRAST:  Dose is currently not available COMPARISON:  None FINDINGS: CTA NECK FINDINGS Aortic arch: Atherosclerotic plaque.  Three vessel branching. Right carotid system: Vessels are smooth and widely patent. No atheromatous changes, notable for age. Left carotid system: Vessels are smooth and widely patent. Minimal atheromatous changes at the bulb. Distal left ICA loop. Vertebral arteries: No proximal subclavian atherosclerosis. Symmetric vertebral arteries that are smooth and widely patent to the dura. Skeleton: Bulky ventral endplate spurring in the cervical and upper thoracic spine consistent with diffuse idiopathic skeletal hyperostosis. Other neck: No acute finding. Partial retropharyngeal course  of the bilateral carotids. Upper chest: Mild patchy air trapping in the apical lungs. Review of the MIP images confirms the above findings CTA HEAD  FINDINGS Anterior circulation: Abrupt left M1 cut off with distal reconstitution but under filled branches. There is atherosclerotic plaque along both carotid siphons with up to 50% stenosis at the right mid cavernous segment. No right-sided or anterior cerebral embolism is seen. Posterior circulation: Calcified plaque on both carotid siphons with subjective moderate narrowing of the right vertebral artery due to calcified plaque-quantification by measurement limited due to small vessel size and plaque blooming. Subsequent mild right V4 luminal narrowing from low-density plaque. The basilar is smooth and widely patent. Symmetric robust flow in cerebellar and posterior cerebral branches. Venous sinuses: Patent Anatomic variants: None significant Review of the MIP images confirms the above findings Critical Value/emergent results were called by telephone at the time of interpretation on 03/07/2019 at 7:58 am to provider Samara Snide , who verbally acknowledged these results. IMPRESSION: 1. Emergent large vessel occlusion at the left M1 segment. There is underfilling of downstream reconstituted vessels. 2. Limited atherosclerosis in the cervical carotids with no ulceration. There is a notable left ICA loop at the skull base. 3. Moderate atheromatous narrowing at the right V4 segment. Electronically Signed   By: Monte Fantasia M.D.   On: 03/07/2019 08:07   CT ANGIO NECK W OR WO CONTRAST  Result Date: 03/07/2019 CLINICAL DATA:  Recent fall. Acute onset of aphasia, gaze deviation, and right-sided hemiplegia. Last seen normal 1 hour ago. EXAM: CT ANGIOGRAPHY HEAD AND NECK TECHNIQUE: Multidetector CT imaging of the head and neck was performed using the standard protocol during bolus administration of intravenous contrast. Multiplanar CT image reconstructions and MIPs were obtained to evaluate the vascular anatomy. Carotid stenosis measurements (when applicable) are obtained utilizing NASCET criteria, using the distal  internal carotid diameter as the denominator. CONTRAST:  Dose is currently not available COMPARISON:  None FINDINGS: CTA NECK FINDINGS Aortic arch: Atherosclerotic plaque.  Three vessel branching. Right carotid system: Vessels are smooth and widely patent. No atheromatous changes, notable for age. Left carotid system: Vessels are smooth and widely patent. Minimal atheromatous changes at the bulb. Distal left ICA loop. Vertebral arteries: No proximal subclavian atherosclerosis. Symmetric vertebral arteries that are smooth and widely patent to the dura. Skeleton: Bulky ventral endplate spurring in the cervical and upper thoracic spine consistent with diffuse idiopathic skeletal hyperostosis. Other neck: No acute finding. Partial retropharyngeal course of the bilateral carotids. Upper chest: Mild patchy air trapping in the apical lungs. Review of the MIP images confirms the above findings CTA HEAD FINDINGS Anterior circulation: Abrupt left M1 cut off with distal reconstitution but under filled branches. There is atherosclerotic plaque along both carotid siphons with up to 50% stenosis at the right mid cavernous segment. No right-sided or anterior cerebral embolism is seen. Posterior circulation: Calcified plaque on both carotid siphons with subjective moderate narrowing of the right vertebral artery due to calcified plaque-quantification by measurement limited due to small vessel size and plaque blooming. Subsequent mild right V4 luminal narrowing from low-density plaque. The basilar is smooth and widely patent. Symmetric robust flow in cerebellar and posterior cerebral branches. Venous sinuses: Patent Anatomic variants: None significant Review of the MIP images confirms the above findings Critical Value/emergent results were called by telephone at the time of interpretation on 03/07/2019 at 7:58 am to provider Samara Snide , who verbally acknowledged these results. IMPRESSION: 1. Emergent large vessel occlusion at  the left  M1 segment. There is underfilling of downstream reconstituted vessels. 2. Limited atherosclerosis in the cervical carotids with no ulceration. There is a notable left ICA loop at the skull base. 3. Moderate atheromatous narrowing at the right V4 segment. Electronically Signed   By: Monte Fantasia M.D.   On: 03/07/2019 08:07   ECHOCARDIOGRAM COMPLETE  Result Date: 03/05/2019    ECHOCARDIOGRAM REPORT   Patient Name:   JERALDIN FESLER Date of Exam: 03/05/2019 Medical Rec #:  [id number]      Height:       62.0 in Accession #:    [id number]     Weight:       239.0 lb Date of Birth:  1945/03/25       BSA:          2.06 m Patient Age:    19 years       BP:           122/72 mmHg Patient Gender: F              HR:           64 bpm. Exam Location:  Inpatient Procedure: 2D Echo Indications:    acute diastolic chf 570.17  History:        Patient has prior history of Echocardiogram examinations, most                 recent 05/03/2017. Chronic kidney disease, Signs/Symptoms:elevated                 troponin; Risk Factors:Hypertension, Dyslipidemia, Diabetes and                 Sleep Apnea.  Sonographer:    Johny Chess Referring Phys: [id number] CHRISTOPHER RONALD Amado  1. Left ventricular ejection fraction, by estimation, is 60 to 65%. The left ventricle has normal function. Left ventricular diastolic parameters are consistent with Grade I diastolic dysfunction (impaired relaxation). Normal wall motion. Mild LV hypertrophy.  2. Right ventricular systolic function is moderately reduced. The right ventricular size is mildly enlarged. Tricuspid regurgitation signal is inadequate for assessing RVSP. Would consider PE.  3. The aortic valve is tricuspid. No stenosis or regurgitation.  4. No mitral regurgitation.  5. The IVC was not visualized. FINDINGS  Left Ventricle: Left ventricular ejection fraction, by estimation, is 60 to 65%. The left ventricle has normal function. The left ventricle has no regional wall  motion abnormalities. The left ventricular internal cavity size was normal in size. There is  mildly increased left ventricular hypertrophy. Right Ventricle: The right ventricular size is mildly enlarged. No increase in right ventricular wall thickness. Right ventricular systolic function is moderately reduced. Tricuspid regurgitation signal is inadequate for assessing RVSP. Left Atrium: Left atrial size was normal in size. Right Atrium: Right atrial size was normal in size. Pericardium: Trivial pericardial effusion is present. Mitral Valve: The mitral valve is normal in structure and function. There is mild calcification of the mitral valve leaflet(s). Mild mitral annular calcification. No evidence of mitral valve regurgitation. No evidence of mitral valve stenosis. Tricuspid Valve: The tricuspid valve is normal in structure. Tricuspid valve regurgitation is not demonstrated. Aortic Valve: The aortic valve is tricuspid. Aortic valve regurgitation is not visualized. Mild aortic valve sclerosis is present, with no evidence of aortic valve stenosis. Pulmonic Valve: The pulmonic valve was normal in structure. Pulmonic valve regurgitation is not visualized. Aorta: The aortic root is normal in size and structure. Venous: The inferior vena cava  was not well visualized. IAS/Shunts: No atrial level shunt detected by color flow Doppler.  LEFT VENTRICLE PLAX 2D LVIDd:         4.40 cm  Diastology LVIDs:         3.00 cm  LV e' lateral:   7.83 cm/s LV PW:         1.00 cm  LV E/e' lateral: 11.4 LV IVS:        1.00 cm  LV e' medial:    6.42 cm/s LVOT diam:     2.00 cm  LV E/e' medial:  13.9 LV SV:         78.54 ml LV SV Index:   23.51 LVOT Area:     3.14 cm  RIGHT VENTRICLE RV S prime:     9.46 cm/s TAPSE (M-mode): 1.8 cm LEFT ATRIUM             Index       RIGHT ATRIUM           Index LA diam:        4.40 cm 2.13 cm/m  RA Area:     12.40 cm LA Vol (A2C):   60.7 ml 29.44 ml/m RA Volume:   29.40 ml  14.26 ml/m LA Vol (A4C):    47.3 ml 22.94 ml/m LA Biplane Vol: 54.9 ml 26.63 ml/m  AORTIC VALVE LVOT Vmax:   113.00 cm/s LVOT Vmean:  73.600 cm/s LVOT VTI:    0.250 m  AORTA Ao Root diam: 2.60 cm MITRAL VALVE MV Area (PHT): 3.21 cm              SHUNTS MV Decel Time: 236 msec              Systemic VTI:  0.25 m MV E velocity: 89.30 cm/s  103 cm/s  Systemic Diam: 2.00 cm MV A velocity: 121.00 cm/s 70.3 cm/s MV E/A ratio:  0.74        1.5 Loralie Champagne MD Electronically signed by Loralie Champagne MD Signature Date/Time: 03/05/2019/5:42:33 PM    Final    CT HEAD CODE STROKE WO CONTRAST  Result Date: 03/07/2019 CLINICAL DATA:  Code stroke. Recent fall. Code stroke for new onset right hemiplegia and aphasia. EXAM: CT HEAD WITHOUT CONTRAST TECHNIQUE: Contiguous axial images were obtained from the base of the skull through the vertex without intravenous contrast. COMPARISON:  11/07/2016 FINDINGS: Brain: Acute subdural hemorrhage along the right cerebral convexity measuring up to 1 cm in thickness. Mild local mass effect with no midline shift. No acute infarct is seen on the left. Vascular: No hyperdense vessel or unexpected calcification. Skull: Normal. Negative for fracture or focal lesion. Sinuses/Orbits: Negative Other: These results were called by telephone at the time of interpretation on 03/07/2019 at 7:58 am to provider [facility] , who verbally acknowledged these results. ASPECTS [facility] Stroke Program Early CT Score) - Ganglionic level infarction (caudate, lentiform nuclei, internal capsule, insula, M1-M3 cortex): 7 - Supraganglionic infarction (M4-M6 cortex): 3 Total score (0-10 with 10 being normal): 10 IMPRESSION: 1. Acute subdural hematoma along the right convexity measuring up to 1 cm in thickness. 2. Left MCA syndrome.  Left hemispheric ASPECTS is 10. Electronically Signed   By: Monte Fantasia M.D.   On: 03/07/2019 08:00   VAS [place] LOWER EXTREMITY VENOUS (DVT)  Result Date: 03/06/2019  Lower Venous DVTStudy Indications: Edema.   Limitations: Body habitus, poor ultrasound/tissue interface and pain tolerance. Comparison Study: no prior Performing Technologist: Abram Sander  RVS  Examination Guidelines: A complete evaluation includes B-mode imaging, spectral Doppler, color Doppler, and power Doppler as needed of all accessible portions of each vessel. Bilateral testing is considered an integral part of a complete examination. Limited examinations for reoccurring indications may be performed as noted. The reflux portion of the exam is performed with the patient in reverse Trendelenburg.  +---------+---------------+---------+-----------+----------+------------------+ RIGHT    CompressibilityPhasicitySpontaneityPropertiesThrombus Aging     +---------+---------------+---------+-----------+----------+------------------+ CFV      Full           Yes      Yes                                     +---------+---------------+---------+-----------+----------+------------------+ SFJ      Full                                                            +---------+---------------+---------+-----------+----------+------------------+ FV Prox  Full                                                            +---------+---------------+---------+-----------+----------+------------------+ FV Mid   Full                                                            +---------+---------------+---------+-----------+----------+------------------+ FV Distal               Yes      Yes                                     +---------+---------------+---------+-----------+----------+------------------+ PFV      Full                                                            +---------+---------------+---------+-----------+----------+------------------+ POP      Full           Yes      Yes                                     +---------+---------------+---------+-----------+----------+------------------+ PTV      Full                                                             +---------+---------------+---------+-----------+----------+------------------+ PERO     Full  limited                                                                  visualization      +---------+---------------+---------+-----------+----------+------------------+   +---------+---------------+---------+-----------+----------+--------------+ LEFT     CompressibilityPhasicitySpontaneityPropertiesThrombus Aging +---------+---------------+---------+-----------+----------+--------------+ CFV      Full           Yes      Yes                                 +---------+---------------+---------+-----------+----------+--------------+ SFJ      Full                                                        +---------+---------------+---------+-----------+----------+--------------+ FV Prox  Full                                                        +---------+---------------+---------+-----------+----------+--------------+ FV Mid   Full                                                        +---------+---------------+---------+-----------+----------+--------------+ FV Distal               Yes      Yes                                 +---------+---------------+---------+-----------+----------+--------------+ PFV      Full                                                        +---------+---------------+---------+-----------+----------+--------------+ POP      Full           Yes      Yes                                 +---------+---------------+---------+-----------+----------+--------------+ PTV      Full                                                        +---------+---------------+---------+-----------+----------+--------------+     Summary: BILATERAL: - No evidence of deep vein thrombosis seen in the lower extremities, bilaterally.   *See table(s) above  for measurements and observations. Electronically signed by Deitra Mayo MD on  03/06/2019 at 58:14:12 PM.    Final      ASSESSMENT AND PLAN   Acute Left Ischemic Stroke secondary to M1 occlusion s/p mechanical thrombectomy with TICI-3 recanalization Right-sided acute subdural hemorrhage  Plan #Admit to neuro ICU for close monitoring # MRI of the brain without contrast #No antiplatelets/anticoagulation due to subdural hemorrhage # BP goal: [phone] Systolic post IR goal # Telemetry monitoring # Frequent neuro checks #PT /OT evaluation and swallow assessment after extubation  Acute respiratory insufficiency post procedure -Patient remains intubated postprocedure given high risk factor profile -Appreciate PCCM assistance in ventilator management  Acute on chronic diastolic heart failure -Repeat echocardiogram to rule out LV thrombus, may consider TEE -Appreciate cardiology and CCM assistance in management -Continue Lasix  Hyperlipidemia -LDL 59 -Patient on Vytorin  Hypertension -BP goal 1 75-4 40 systolic, -On Cleviprex gtt. -Discontinued amlodipine, hydralazine for now, can be restarted after patient extubated  CKD stage IV -Patient unfortunately required contrast for life saving diagnostic testing and treatment in the setting of acute stroke -Resume IV fluids, appreciate CCM assistance  Diabetes mellitus -Continue sliding scale insulin   Full code DVT prophylaxis SCD   This patient is neurologically critically ill due to Stroke s/p thrombectomy.  She is at risk for significant risk of neurological worsening from cerebral edema,  death from brain herniation, heart failure, hemorrhagic conversion, infection, respiratory failure and seizure. This patient's care requires constant monitoring of vital signs, hemodynamics, respiratory and cardiac monitoring, review of multiple databases, neurological assessment, discussion with family, other specialists and medical  decision making of high complexity.  I spent 70  minutes of neurocritical time in the care of this patient.     Please page stroke NP  Or  PA  Or MD from 8am -4 pm  as this patient from this time will be  followed by the stroke.   You can look them up on [email or web address]  Password [facility]      Shanai Lartigue [facility] Pager Number [phone]

## 2019-03-07 NOTE — Sedation Documentation (Signed)
Pt remains intubated and sedated. Noted some movement of left arm and both legs. Cough present. No eye opening.

## 2019-03-07 NOTE — Transfer of Care (Signed)
Immediate Anesthesia Transfer of Care Note  Patient: [name]  Procedure(s) Performed: IR WITH ANESTHESIA (N/A )  Patient Location: ICU  Anesthesia Type:General  Level of Consciousness: Patient remains intubated per anesthesia plan  Airway & Oxygen Therapy: Patient remains intubated per anesthesia plan and Patient placed on Ventilator (see vital sign flow sheet for setting)  Post-op Assessment: Post -op Vital signs reviewed and stable  Post vital signs: stable  Last Vitals:  Vitals Value Taken Time  BP    Temp    Pulse 69 03/07/19 1018  Resp 17 03/07/19 1018  SpO2 100 % 03/07/19 1018  Vitals shown include unvalidated device data.  Last Pain:  Vitals:   03/07/19 0731  TempSrc: Oral  PainSc:       Patients Stated Pain Goal: 0 (41/96/22 2979)  Complications: No apparent anesthesia complications

## 2019-03-07 NOTE — Progress Notes (Signed)
SLP Cancellation Note  Patient Details Name: [name] MRN: [id number] DOB: 03/29/1945   Cancelled treatment:       Reason Eval/Treat Not Completed: Patient not medically ready. Order received for cognitive-linguistic evaluation but pt remains intubated s/p IR. Will follow.     Osie Bond., M.A. [facility] Pager [phone] Office [phone]  03/07/2019, 10:52 AM

## 2019-03-07 NOTE — Anesthesia Preprocedure Evaluation (Signed)
Anesthesia Evaluation  Patient identified by MRN, date of birth, ID band  Reviewed: Unable to perform ROS - Chart review onlyPreop documentation limited or incomplete due to emergent nature of procedure.  Airway Mallampati: IV  TM Distance: >3 FB Neck ROM: Full    Dental  (+) Dental Advisory Given   Pulmonary sleep apnea ,    breath sounds clear to auscultation       Cardiovascular hypertension, + Peripheral Vascular Disease and +CHF  + dysrhythmias  Rhythm:Regular  Nl ef, diastolic chf, bradycardia    Neuro/Psych Seizures -,  PSYCHIATRIC DISORDERS Depression  Neuromuscular disease CVA, Residual Symptoms    GI/Hepatic GERD  ,  Endo/Other  diabetesMorbid obesity  Renal/GU CRFRenal disease     Musculoskeletal   Abdominal   Peds  Hematology  (+) anemia ,   Anesthesia Other Findings   Reproductive/Obstetrics                             Anesthesia Physical Anesthesia Plan  ASA: IV  Anesthesia Plan: General   Post-op Pain Management:    Induction: Intravenous  PONV Risk Score and Plan: 3 and Ondansetron and Treatment may vary due to age or medical condition  Airway Management Planned: Oral ETT  Additional Equipment: Arterial line  Intra-op Plan:   Post-operative Plan: Possible Post-op intubation/ventilation  Informed Consent:     History available from chart only and Only emergency history available  Plan Discussed with: CRNA and Surgeon  Anesthesia Plan Comments:         Anesthesia Quick Evaluation

## 2019-03-07 NOTE — Anesthesia Procedure Notes (Signed)
Arterial Line Insertion Start/End2/10/2019 8:15 AM, 03/07/2019 8:20 AM Performed by: Verdie Drown, CRNA  Preanesthetic checklist: patient identified, IV checked, risks and benefits discussed, surgical consent, monitors and equipment checked, pre-op evaluation and timeout performed Left, radial was placed Catheter size: 20 G Hand hygiene performed , maximum sterile barriers used  and Seldinger technique used Allen's test indicative of satisfactory collateral circulation Attempts: 1 Procedure performed without using ultrasound guided technique. Following insertion, dressing applied. Post procedure assessment: normal  Patient tolerated the procedure well with no immediate complications.

## 2019-03-07 NOTE — Progress Notes (Addendum)
Pt. Found this am by RN with right sided facial droop, garbled speech, and unable to move right arm/leg. Pt. Unable to respond to verbal commands.Pt. last seen normal at 0630.  RR RN, Cardiology notified. Code Stroke initiated. VSS. Pt. Transported to CT. Family to be contacted by physician.

## 2019-03-07 NOTE — Progress Notes (Addendum)
Patient ID: ELIM PEALE, female   DOB: Jun 22, 1945, 74 y.o.   MRN: [id number] INR. 89 y RRT H F  In patient. MRSS 1 to 2? New onset of aphasia and RT sided weakness NIH 25  CT brain small RT temporal/parietal occipital subacute subdural. No mass effect. ASPECTS 10 CTA occluded Lt MCA M1 seg . Endovascular;ar treatment discussed with daughter. Procedure,reasos and alternatives were reviewed. Risks of ICH of 10 %,worsening neuro deficit,death and inability to revascularize were reviewed. Daughter  expressed understanding and provided consent to to the treatment. S,Shalandra Leu MD. Post procedure CT no New ICH or mass effect noted. RT groin hemostasis with manual compression and 52F angioseal device. Distal pulses palpable RT DP ,and dopplerable RT PT ,and LT DP and LT PT . S.Icarus Partch MD  Patient left intubated to protect airway. Upon partial reversal of anesthesia moved all 4s spontaneously. S.Joann Jorge MD

## 2019-03-07 NOTE — Plan of Care (Signed)
  Problem: Clinical Measurements: Goal: Will remain free from infection Outcome: Progressing   Problem: Activity: Goal: Risk for activity intolerance will decrease Outcome: Progressing   Problem: Elimination: Goal: Will not experience complications related to urinary retention Outcome: Progressing   Problem: Pain Managment: Goal: General experience of comfort will improve Outcome: Progressing   Problem: Safety: Goal: Ability to remain free from injury will improve Outcome: Progressing   Problem: Elimination: Goal: Will not experience complications related to bowel motility Outcome: Completed/Met

## 2019-03-07 NOTE — Consult Note (Signed)
NAME:  KALEB LINQUIST, MRN:  [id number], DOB:  1945-10-30, LOS: 2 ADMISSION DATE:  03/04/2019, CONSULTATION DATE:  2/10 REFERRING MD:  Dr Lorraine Lax, CHIEF COMPLAINT:  CVA   Brief History   74 year old female admitted 2/7 for AoC HFpEF and was undergoing diuretic therapy when she suffered a CVA in the early hours of 2/10. She was not given tPA due to subdural hemorrhage on CT, but was taken to IR for intervention. Post-op she remained on vent.   History of present illness   74 year old female with PMH as below, which is significant for HFpEF, CKD IV, DM, HTN, OSA, and morbid obesity. She presented to [facility] ED 2/8 with complaints of weakness and falls. She had apparently been falling for about one week. 2/5 she presented to ED with this complaint. Workup showed low grade fever and mild hypoxemia on ambulation. She was discharged to home. Then 2/8 she called EMS for similar complaint. She was hypoglycemic, whic was treated with EMS assistance and she elected not to come the ED. Later 2/8 she continued to feel weak and presented to ED. She was felt to be volume overloaded and had profound dyspnea on exertion. She was admitted to the cardiology service for treatment of acute on chronic HFpEF. Findings such as increased troponin were felt to coincide with this. She was treated with IV diuresis.   She was progressing as expected until 2/10 when RN found her to be unresponsive with gaze preference LKW about one hour prior. Code stroke was called. CT head showed acute SDH and left MCA syndrome. Due to SDH tPA was not given and she was taken to IR suite for endovascular intervention. Post-op she was sent to ICU for recovery. PCCM asked to see.   Past Medical History   has a past medical history of 1st degree AV block (05/02/2017), Acute encephalopathy (09/25/2012), Acute hyperkalemia (05/02/2017), Acute renal failure ([place]) (12/11/2014), Acute respiratory failure with hypoxia ([place]) (05/02/2017), Anemia, Arterial embolism  and thrombosis of upper extremity (HCC), Chronic diastolic (congestive) heart failure ([place]), CKD (chronic kidney disease) stage 4, GFR 15-29 ml/min ([place]) (05/02/2017), Depression, Diabetes mellitus type 2, insulin dependent ([place]) (05/02/2017), DVT (deep venous thrombosis) ([place]), Dyslipidemia (05/02/2017), Essential hypertension, GERD (gastroesophageal reflux disease), HTN (hypertension) (05/02/2017), Hyperlipidemia, Junctional bradycardia, Morbid obesity with BMI of 45.0-49.9, adult ([place]) (05/02/2017), OSA (obstructive sleep apnea) (10/17/2012), Rhabdomyolysis (09/25/2012), Seizures ([place]), Symptomatic bradycardia (11/27/2016), and Type II diabetes mellitus ([place]).   Significant Hospital Events   2/7 admit 2/10 CVA, to IR, to ICU intubated post op  Consults:  Cardiology Neurology IR  Procedures:  2/10 IR revascularization of Lt MCA  Significant Diagnostic Tests:  CT head 2/10 > Acute subdural hematoma along the right convexity measuring up to 1 cm in thickness. Left MCA syndrome. CTA/P head/neck 2/10 > Emergent large vessel occlusion at the left M1 segment. Limited atherosclerosis in the cervical carotids with no ulceration. There is a notable left ICA loop at the skull base. MRI brain 2/10 >  Micro Data:    Antimicrobials:     Interim history/subjective:    Objective   Blood pressure (!) 148/67, pulse 65, temperature 99.4 F (37.4 C), temperature source Oral, resp. rate 20, height 5\' 2"  (1.575 m), weight 110 kg, SpO2 100 %.    Vent Mode: PRVC FiO2 (%):  [40 %] 40 % Set Rate:  [15 bmp] 15 bmp Vt Set:  [400 mL] 400 mL PEEP:  [5 [place]  Pressure:  [21 cmH20] 21 cmH20   Intake/Output Summary (Last 24 hours) at 03/07/2019 1031 Last data filed at 03/07/2019 0919 Gross per 24 hour  Intake 753 ml  Output 150 ml  Net 603 ml   Filed Weights   03/05/19 0522 03/06/19 0326 03/07/19 0446  Weight: 108.4 kg 109.5 kg 110 kg    Examination: General: morbidly obese female on  vent HENT: [place]/AT, PERRL, no JVD Lungs: Distant clear breath sounds Cardiovascular: RRR, no MRG Abdomen: Soft, non-distended Extremities: No acute deformity Neuro: Sedated. RASS -2. Followed commands in both uppers for RN GU: [place]  Resolved Hospital Problem list     Assessment & Plan:   Acute hypoxemic respiratory failure: secondary to CVA and IR sedation related encephalopathy - Full vent support - Wean to extubate this afternoon if she will tolerate - Propofol infusion for RASS goal 0 to -1 in the mean time - VAP bundle - CXR and ABG   Left MCA CVA: s/p IR procedure 2/10 Subdural hematoma - Neurology/IR following - MRI pending  Acute on chronic HFpEF Elevated troponin: has been marginally trending down - Cardiology following - Continue home dose lasix via tube  Hypertension - Cleviprex infusion per IR goals, generally 120-140 mmHg - Continue amlodipine, hydralazine, furosemide - cardiology following  Acute kindey injury: serum creatinine trending down CKD IV  - follow BMP with diuresis  OSA: uncertain CPAP status.  - close monitoring after extubation. - Suspect she would benefit from sleep study  DM2 - CBG monitoring and SSI  Best practice:  Diet: NPO Pain/Anxiety/Delirium protocol (if indicated): Propofol as above VAP protocol (if indicated): Per protocol DVT prophylaxis: SQH GI prophylaxis: PPI Glucose control: SSI Mobility: BR Code Status: FULL Family Communication: per primary Disposition: ICU  Labs   CBC: Recent Labs  Lab 03/02/19 0855 03/04/19 0954 03/04/19 1012 03/04/19 1557 03/06/19 0853  WBC 8.5 6.5  --  6.0 4.6  NEUTROABS 7.2 5.3  --   --   --   HGB 10.4* 9.7* 10.5* 9.6* 9.3*  HCT 30.9* 29.4* 31.0* 27.5* 28.0*  MCV 94.2 94.2  --  90.5 94.6  PLT 213 185  --  182 660    Basic Metabolic Panel: Recent Labs  Lab 03/02/19 0855 03/02/19 0855 03/04/19 0954 03/04/19 1012 03/04/19 1557 03/05/19 0405 03/06/19 0846 03/07/19 0542   NA 135   < > 137 137  --  139 136 136  K 4.1   < > 4.0 4.0  --  3.8 4.5 4.5  CL 101  --  103  --   --  106 101 104  CO2 21*  --  23  --   --  21* 25 23  GLUCOSE 250*  --  273*  --   --  91 192* 125*  BUN 60*  --  58*  --   --  58* 56* 52*  CREATININE 3.43*   < > 3.65*  --  3.45* 3.53* 3.34* 3.17*  CALCIUM 8.8*  --  8.4*  --   --  8.3* 8.3* 8.4*   < > = values in this interval not displayed.   GFR: Estimated Creatinine Clearance: 19 mL/min (A) (by C-G formula based on SCr of 3.17 mg/dL (H)). Recent Labs  Lab 03/02/19 0854 03/02/19 0855 03/04/19 0954 03/04/19 1557 03/06/19 0853  WBC  --  8.5 6.5 6.0 4.6  LATICACIDVEN 1.5  --   --   --   --     Liver Function  Tests: Recent Labs  Lab 03/02/19 0855  AST 17  ALT 13  ALKPHOS 101  BILITOT 1.0  PROT 7.4  ALBUMIN 4.0   No results for input(s): LIPASE, AMYLASE in the last 168 hours. No results for input(s): AMMONIA in the last 168 hours.  ABG    Component Value Date/Time   HCO3 22.3 03/04/2019 1012   TCO2 24 03/04/2019 1012   ACIDBASEDEF 4.0 (H) 03/04/2019 1012   O2SAT 57.0 03/04/2019 1012     Coagulation Profile: No results for input(s): INR, PROTIME in the last 168 hours.  Cardiac Enzymes: No results for input(s): CKTOTAL, CKMB, CKMBINDEX, TROPONINI in the last 168 hours.  HbA1C: Hgb A1c MFr Bld  Date/Time Value Ref Range Status  03/05/2019 04:05 AM 5.6 4.8 - 5.6 % Final    Comment:    (NOTE)         Prediabetes: 5.7 - 6.4         Diabetes: >6.4         Glycemic control for adults with diabetes: <7.0   03/04/2019 03:57 PM 5.6 4.8 - 5.6 % Final    Comment:    REPEATED TO VERIFY (NOTE) Pre diabetes:          5.7%-6.4% Diabetes:              >6.4% Glycemic control for   <7.0% adults with diabetes     CBG: Recent Labs  Lab 03/06/19 0622 03/06/19 1116 03/06/19 1641 03/06/19 2118 03/07/19 0614  GLUCAP 109* 207* 113* 157* 112*    Review of Systems:   Unable as patient is encephalopathic and  intubated  Past Medical History  She,  has a past medical history of 1st degree AV block (05/02/2017), Acute encephalopathy (09/25/2012), Acute hyperkalemia (05/02/2017), Acute renal failure ([place]) (12/11/2014), Acute respiratory failure with hypoxia (HCC) (05/02/2017), Anemia, Arterial embolism and thrombosis of upper extremity (HCC), Chronic diastolic (congestive) heart failure ([place]), CKD (chronic kidney disease) stage 4, GFR 15-29 ml/min (HCC) (05/02/2017), Depression, Diabetes mellitus type 2, insulin dependent ([place]) (05/02/2017), DVT (deep venous thrombosis) ([place]), Dyslipidemia (05/02/2017), Essential hypertension, GERD (gastroesophageal reflux disease), HTN (hypertension) (05/02/2017), Hyperlipidemia, Junctional bradycardia, Morbid obesity with BMI of 45.0-49.9, adult ([place]) (05/02/2017), OSA (obstructive sleep apnea) (10/17/2012), Rhabdomyolysis (09/25/2012), Seizures ([place]), Symptomatic bradycardia (11/27/2016), and Type II diabetes mellitus ([place]).   Surgical History    Past Surgical History:  Procedure Laterality Date  . THROMBECTOMY BRACHIAL ARTERY Left 12/15/2014   Procedure: THROMBECTOMY BRACHIAL ARTERY; VEIN PATCH ANGIOPLASTY LEFT BRACIAL ARTERY;  Surgeon: Angelia Mould, MD;  Location: [facility];  Service: Vascular;  Laterality: Left;  . TUBAL LIGATION       Social History   reports that she has never smoked. She has never used smokeless tobacco. She reports that she does not drink alcohol or use drugs.   Family History   Her family history includes Cancer in her maternal aunt; Hypertension in her father and mother. There is no history of Seizures.   Allergies Allergies  Allergen Reactions  . Codeine Nausea And Vomiting  . Metoprolol Other (See Comments)    Severe bradycardia.  Now getting admitted for 3rd time in 3 years for bradycardia onset after being started on toprol.  Please dont put patient on this med anymore!     Home Medications  Prior to Admission medications   Medication Sig Start  Date End Date Taking? Authorizing Provider  amLODipine (NORVASC) 10 MG tablet Take 10 mg by mouth daily.   Yes  [provider]  Ascorbic Acid (VITAMIN C PO) Take 1 tablet by mouth daily.   Yes [provider]  aspirin 81 MG chewable tablet Chew 1 tablet (81 mg total) by mouth daily. 12/21/14  Yes Rai, Ripudeep K, MD  bisacodyl (DULCOLAX) 5 MG EC tablet Take 2 tablets (10 mg total) by mouth daily as needed. 05/06/17  Yes Aline August, MD  carbamazepine (TEGRETOL) 200 MG tablet Take 400 mg by mouth 2 (two) times daily.    Yes [provider]  Cholecalciferol (VITAMIN D-3) 1000 UNITS CAPS Take 1,000 Units by mouth daily at 12 noon.    Yes [provider]  citalopram (CELEXA) 20 MG tablet Take 20 mg by mouth daily.   Yes [provider]  DUREZOL 0.05 % EMUL Place 1 drop into the right eye 3 (three) times daily. 02/22/19  Yes [provider]  ezetimibe-simvastatin (VYTORIN) 10-40 MG per tablet Take 1 tablet by mouth daily.   Yes [provider]  furosemide (LASIX) 80 MG tablet Take 0.5 tablets (40 mg total) by mouth daily. 05/06/17  Yes Aline August, MD  glipiZIDE (GLUCOTROL XL) 5 MG 24 hr tablet Take 5 mg by mouth daily with breakfast.  03/24/18  Yes [provider]  HUMULIN 70/30 KWIKPEN (70-30) 100 UNIT/ML PEN Inject 5 Units into the skin 2 (two) times daily. 05/06/17  Yes Aline August, MD  hydrALAZINE (APRESOLINE) 25 MG tablet Take 1 tablet (25 mg total) by mouth 3 (three) times daily. 05/06/17  Yes Aline August, MD  mirtazapine (REMERON) 15 MG tablet Take 15 mg by mouth at bedtime.  05/06/18  Yes [provider]  omeprazole (PRILOSEC) 40 MG capsule Take 40 mg by mouth daily.   Yes [provider]  ACCU-CHEK AVIVA PLUS test strip 3 (three) times daily. for testing 04/14/18   [provider]  Accu-Chek Softclix Lancets lancets See admin instructions. 12/29/17   [provider]  B-D ULTRAFINE III  SHORT PEN 31G X 8 MM MISC See admin instructions. 04/18/18   [provider]     Critical care time: 40 mins      Georgann Housekeeper, AGACNP-BC [place] for personal pager PCCM on call pager [phone]  03/07/2019 10:31 AM

## 2019-03-08 ENCOUNTER — Inpatient Hospital Stay (HOSPITAL_COMMUNITY): Payer: Medicare Other

## 2019-03-08 DIAGNOSIS — I63412 Cerebral infarction due to embolism of left middle cerebral artery: Secondary | ICD-10-CM

## 2019-03-08 DIAGNOSIS — Z794 Long term (current) use of insulin: Secondary | ICD-10-CM

## 2019-03-08 DIAGNOSIS — E7800 Pure hypercholesterolemia, unspecified: Secondary | ICD-10-CM

## 2019-03-08 DIAGNOSIS — J95821 Acute postprocedural respiratory failure: Secondary | ICD-10-CM

## 2019-03-08 DIAGNOSIS — I6602 Occlusion and stenosis of left middle cerebral artery: Secondary | ICD-10-CM

## 2019-03-08 DIAGNOSIS — E1159 Type 2 diabetes mellitus with other circulatory complications: Secondary | ICD-10-CM

## 2019-03-08 LAB — CBC WITH DIFFERENTIAL/PLATELET
Abs Immature Granulocytes: 0.03 10*3/uL (ref 0.00–0.07)
Basophils Absolute: 0 10*3/uL (ref 0.0–0.1)
Basophils Relative: 0 %
Eosinophils Absolute: 0 10*3/uL (ref 0.0–0.5)
Eosinophils Relative: 0 %
HCT: 25.5 % — ABNORMAL LOW (ref 36.0–46.0)
Hemoglobin: 8.4 g/dL — ABNORMAL LOW (ref 12.0–15.0)
Immature Granulocytes: 0 %
Lymphocytes Relative: 5 %
Lymphs Abs: 0.4 10*3/uL — ABNORMAL LOW (ref 0.7–4.0)
MCH: 31.3 pg (ref 26.0–34.0)
MCHC: 32.9 g/dL (ref 30.0–36.0)
MCV: 95.1 fL (ref 80.0–100.0)
Monocytes Absolute: 0.8 10*3/uL (ref 0.1–1.0)
Monocytes Relative: 10 %
Neutro Abs: 6.3 10*3/uL (ref 1.7–7.7)
Neutrophils Relative %: 85 %
Platelets: 198 10*3/uL (ref 150–400)
RBC: 2.68 MIL/uL — ABNORMAL LOW (ref 3.87–5.11)
RDW: 12.5 % (ref 11.5–15.5)
WBC: 7.4 10*3/uL (ref 4.0–10.5)
nRBC: 0 % (ref 0.0–0.2)

## 2019-03-08 LAB — LIPID PANEL
Cholesterol: 123 mg/dL (ref 0–200)
HDL: 44 mg/dL (ref >40)
LDL Cholesterol: 55 mg/dL (ref 0–99)
Total CHOL/HDL Ratio: 2.8 RATIO
Triglycerides: 121 mg/dL (ref <150)
VLDL: 24 mg/dL (ref 0–40)

## 2019-03-08 LAB — BASIC METABOLIC PANEL
Anion gap: 12 (ref 5–15)
BUN: 42 mg/dL — ABNORMAL HIGH (ref 8–23)
CO2: 20 mmol/L — ABNORMAL LOW (ref 22–32)
Calcium: 8.2 mg/dL — ABNORMAL LOW (ref 8.9–10.3)
Chloride: 105 mmol/L (ref 98–111)
Creatinine, Ser: 2.51 mg/dL — ABNORMAL HIGH (ref 0.44–1.00)
GFR calc Af Amer: 22 mL/min — ABNORMAL LOW (ref >60)
GFR calc non Af Amer: 19 mL/min — ABNORMAL LOW (ref >60)
Glucose, Bld: 162 mg/dL — ABNORMAL HIGH (ref 70–99)
Potassium: 4.2 mmol/L (ref 3.5–5.1)
Sodium: 137 mmol/L (ref 135–145)

## 2019-03-08 LAB — HEMOGLOBIN A1C
Hgb A1c MFr Bld: 5.6 % (ref 4.8–5.6)
Mean Plasma Glucose: 114.02 mg/dL

## 2019-03-08 LAB — GLUCOSE, CAPILLARY
Glucose-Capillary: 154 mg/dL — ABNORMAL HIGH (ref 70–99)
Glucose-Capillary: 156 mg/dL — ABNORMAL HIGH (ref 70–99)
Glucose-Capillary: 217 mg/dL — ABNORMAL HIGH (ref 70–99)

## 2019-03-08 MED ORDER — CARBAMAZEPINE 200 MG PO TABS
400.0000 mg | ORAL_TABLET | Freq: Two times a day (BID) | ORAL | Status: DC
  Administered 2019-03-08 – 2019-03-14 (×12): 400 mg via ORAL
  Filled 2019-03-08 (×14): qty 2

## 2019-03-08 MED ORDER — EZETIMIBE 10 MG PO TABS
10.0000 mg | ORAL_TABLET | Freq: Every day | ORAL | Status: DC
  Administered 2019-03-09 – 2019-03-14 (×6): 10 mg via ORAL
  Filled 2019-03-08 (×6): qty 1

## 2019-03-08 MED ORDER — HYDRALAZINE HCL 20 MG/ML IJ SOLN: 10.0000 mg | INTRAMUSCULAR | Status: DC | PRN

## 2019-03-08 MED ORDER — PANTOPRAZOLE SODIUM 40 MG PO PACK
40.0000 mg | PACK | Freq: Every day | ORAL | Status: DC
  Administered 2019-03-09 – 2019-03-10 (×2): 40 mg via ORAL
  Filled 2019-03-08 (×3): qty 20

## 2019-03-08 MED ORDER — LORAZEPAM 1 MG PO TABS
2.0000 mg | ORAL_TABLET | Freq: Once | ORAL | Status: AC
  Administered 2019-03-08: 2 mg via ORAL
  Filled 2019-03-08: qty 2

## 2019-03-08 MED ORDER — ASPIRIN EC 81 MG PO TBEC
81.0000 mg | DELAYED_RELEASE_TABLET | Freq: Every day | ORAL | Status: DC
  Administered 2019-03-08 – 2019-03-14 (×7): 81 mg via ORAL
  Filled 2019-03-08 (×7): qty 1

## 2019-03-08 MED ORDER — MIRTAZAPINE 15 MG PO TABS
15.0000 mg | ORAL_TABLET | Freq: Every day | ORAL | Status: DC
  Administered 2019-03-08 – 2019-03-13 (×6): 15 mg via ORAL
  Filled 2019-03-08 (×6): qty 1

## 2019-03-08 MED ORDER — SIMVASTATIN 20 MG PO TABS
40.0000 mg | ORAL_TABLET | Freq: Every day | ORAL | Status: DC
  Administered 2019-03-09 – 2019-03-14 (×6): 40 mg
  Filled 2019-03-08 (×6): qty 2

## 2019-03-08 MED ORDER — SODIUM CHLORIDE 0.9 % IV BOLUS
500.0000 mL | Freq: Once | INTRAVENOUS | Status: AC
  Administered 2019-03-08: 05:00:00 500 mL via INTRAVENOUS

## 2019-03-08 NOTE — Plan of Care (Signed)
Multiple calls re: patient overnight  -SBP with max Cleviprex initially in mid 140s. I increased cleviprex from 0-21 to 0-32.  -Second call with SBP 110s-teens after cessation of Cleviprex. Recommended close monitoring and up and downtitrating meds to maintain SBP in the ordered 120-140 range as much as possible.  -Received call again around [phone] - art line not working. Asked to go by cuff pressure. SBP in high 110s- ordered 500 cc NS bolus.  -- Amie [place], MD Neurology

## 2019-03-08 NOTE — Progress Notes (Signed)
Despite multiple troubleshooting attempts by myself and RT, arterial line not functioning. Neurology made aware, verbal order to go by cuff pressure.   Also notified MD that BP is consistently below ordered parameters despite cleviprex being held/sedation titrated down. Orders received for 500 mL NS bolus.

## 2019-03-08 NOTE — Progress Notes (Signed)
SLP Cancellation Note  Patient Details Name: [name] MRN: [id number] DOB: 1945-10-29   Cancelled treatment:       Reason Eval/Treat Not Completed: Medical issues which prohibited therapy - remains on vent this morning. Will continue to follow.    Osie Bond., M.A. [facility] Pager [phone] Office [phone]  03/08/2019, 7:51 AM

## 2019-03-08 NOTE — Evaluation (Signed)
Occupational Therapy Evaluation Patient Details Name: [name] MRN: [id number] DOB: [birth year] Today's Date: 03/08/2019    History of Present Illness Patient is a 74 y/o female who presents with dyspnea on exertion and multiple falls at home. Admitted with acute on chronic CHF. PMH includes CHF, thoracic scoliosis, CKD stage 4, obesity, HTN, DVT, seizures, DM.   Clinical Impression   PTA pt reports she was living alone, using RW. Per RN and her report, dtr was assisting quite a bit recently and pt has had increased number of falls. Per RN, dtr had stated she felt pt needed more support than what is currently being provided. Pt presents with cognitive deficits, decreased strength, and poor vision. Pt has difficulty reporting symptoms, noted overall decreased weakness in BUEs but no specific neurological symptoms. Pt appears to have peripheral deficits, but again difficulty reporting and will need further assessment. Pt able to complete bed mobility at min A and transfers/functional mobility with min A +2 with RW. Given current status, recommend SNF at d/c for continued progression of BADLs prior to returning home. Will continue to follow per POC listed below.     Follow Up Recommendations  SNF;Supervision/Assistance - 24 hour    Equipment Recommendations  None recommended by OT    Recommendations for Other Services       Precautions / Restrictions Precautions Precautions: Fall Precaution Comments: 3 falls prior to admission Restrictions Weight Bearing Restrictions: No      Mobility Bed Mobility Overal bed mobility: Needs Assistance Bed Mobility: Supine to Sit     Supine to sit: Min assist;HOB elevated     General bed mobility comments: Able to bring LEs to EOB, assist with trunk to get upright and scoot bottom.  Transfers Overall transfer level: Needs assistance Equipment used: Rolling walker (2 wheeled) Transfers: Sit to/from Stand Sit to Stand: Min assist;+2 physical  assistance         General transfer comment: cues for hand placement.  some assist to come forward and to boost up.    Balance Overall balance assessment: Needs assistance;History of Falls Sitting-balance support: Feet supported;Single extremity supported;No upper extremity supported Sitting balance-Leahy Scale: Fair Sitting balance - Comments: Supervision for safety.   Standing balance support: During functional activity Standing balance-Leahy Scale: Poor Standing balance comment: reliant on UE support on an AD and min assist for dynamic stability presently                           ADL either performed or assessed with clinical judgement   ADL Overall ADL's : Needs assistance/impaired Eating/Feeding: NPO   Grooming: Set up;Sitting   Upper Body Bathing: Minimal assistance;Sitting   Lower Body Bathing: Maximal assistance;Sit to/from stand   Upper Body Dressing : Minimal assistance;Sitting   Lower Body Dressing: Maximal assistance;Sit to/from stand;Sitting/lateral leans   Toilet Transfer: Minimal assistance;+2 for physical assistance;+2 for safety/equipment;Stand-pivot;BSC   Toileting- Clothing Manipulation and Hygiene: Maximal assistance;Sit to/from stand       Functional mobility during ADLs: Minimal assistance;+2 for physical assistance;+2 for safety/equipment;Cueing for safety;Rolling walker General ADL Comments: pt limited by cognitive deficits, decreased strength and endurance for BADL     Vision Baseline Vision/History: No visual deficits Patient Visual Report: No change from baseline Vision Assessment?: Vision impaired- to be further tested in functional context Additional Comments: pt appears to have peripheral deficits. Unsure of specificity due to decreased cognition and pt becoming frustrated and tearful with testing. Cannot identify  therapist fingers in periphery     Perception     Praxis      Pertinent Vitals/Pain Pain Assessment:  Faces Faces Pain Scale: Hurts a little bit Pain Location: left hip/buttock Pain Descriptors / Indicators: Sore;Aching Pain Intervention(s): Monitored during session     Hand Dominance Right   Extremity/Trunk Assessment Upper Extremity Assessment Upper Extremity Assessment: Generalized weakness(pt not able to report whether one UE had increased numbness/weakness)   Lower Extremity Assessment Lower Extremity Assessment: Defer to PT evaluation       Communication Communication Communication: No difficulties   Cognition Arousal/Alertness: Awake/alert Behavior During Therapy: WFL for tasks assessed/performed Overall Cognitive Status: Impaired/Different from baseline Area of Impairment: Orientation;Attention;Memory;Safety/judgement;Following commands;Awareness;Problem solving                 Orientation Level: Disoriented to;Situation Current Attention Level: Selective Memory: Decreased short-term memory Following Commands: Follows one step commands with increased time Safety/Judgement: Decreased awareness of safety;Decreased awareness of deficits Awareness: Emergent Problem Solving: Decreased initiation;Slow processing;Difficulty sequencing;Requires verbal cues;Requires tactile cues General Comments: pt with overall decreased awareness of deficits, STM, and executive functioning   General Comments       Exercises     Shoulder Instructions      Home Living Family/patient expects to be discharged to:: Private residence Living Arrangements: Alone Available Help at Discharge: Family;Available PRN/intermittently Type of Home: Apartment Home Access: Level entry     Home Layout: One level     Bathroom Shower/Tub: Teacher, early years/pre: Standard     Home Equipment: Tub bench;Walker - 2 wheels;Cane - single point          Prior Functioning/Environment Level of Independence: Needs assistance  Gait / Transfers Assistance Needed: Uses RW at baseline.  Does not drive. ADL's / Homemaking Assistance Needed: Does ADLs, sink baths. Daughter helps with grocery shopping.   Comments: 3 falls leading to admission        OT Problem List: Decreased strength;Decreased knowledge of use of DME or AE;Decreased knowledge of precautions;Decreased activity tolerance;Decreased cognition;Impaired vision/perception;Impaired balance (sitting and/or standing);Decreased safety awareness      OT Treatment/Interventions: Self-care/ADL training;Visual/perceptual remediation/compensation;Therapeutic exercise;Patient/family education;Balance training;Energy conservation;Therapeutic activities;Neuromuscular education;DME and/or AE instruction;Cognitive remediation/compensation    OT Goals(Current goals can be found in the care plan section) Acute Rehab OT Goals Patient Stated Goal: to stop falling OT Goal Formulation: With patient Time For Goal Achievement: 03/22/19 Potential to Achieve Goals: Good  OT Frequency: Min 2X/week   Barriers to D/C:            Co-evaluation              AM-PAC OT "6 Clicks" Daily Activity     Outcome Measure Help from another person eating meals?: Total(NPO) Help from another person taking care of personal grooming?: A Little Help from another person toileting, which includes using toliet, bedpan, or urinal?: A Lot Help from another person bathing (including washing, rinsing, drying)?: A Lot Help from another person to put on and taking off regular upper body clothing?: A Little Help from another person to put on and taking off regular lower body clothing?: A Lot 6 Click Score: 13   End of Session Equipment Utilized During Treatment: Gait belt;Rolling walker Nurse Communication: Mobility status  Activity Tolerance: Patient tolerated treatment well Patient left: in bed;with call bell/phone within reach;with bed alarm set  OT Visit Diagnosis: Unsteadiness on feet (R26.81);Other abnormalities of gait and mobility  (R26.89);Repeated falls (R29.6);Muscle weakness (generalized) (M62.81);History of  falling (Z91.81);Other symptoms and signs involving cognitive function                Time: 1040-1108 OT Time Calculation (min): 28 min Charges:  OT General Charges $OT Visit: 1 Visit OT Evaluation $OT Eval Moderate Complexity: 1 Emmett, MSOT, OTR/L Acute Rehabilitation Services [facility] Office Number: [phone]  Zenovia Jarred 03/08/2019, 5:04 PM

## 2019-03-08 NOTE — Progress Notes (Signed)
STROKE TEAM PROGRESS NOTE   INTERVAL HISTORY RN at bedside, pt awake alert, interactive and orientated. She stated that she still has left UE and LE pain which is chronic and she takes ASA 81 at home. She still has some numbness tingling at left UE and LE which she can not tell me when it started. Her strength are symmetric but slight left facial droop. MRI pending. She is claustrophobic and will give ativan po before MRI.   Vitals:   03/08/19 0615 03/08/19 0630 03/08/19 0812 03/08/19 0820  BP: (!) 125/39 120/83 93/69   Pulse: 69 67 72   Resp: 15 20 11    Temp:    (!) 100.5 F (38.1 C)  TempSrc:    Axillary  SpO2: 100% 100% 99%   Weight:      Height:        CBC:  Recent Labs  Lab 03/04/19 0954 03/04/19 1012 03/06/19 0853 03/07/19 1228 03/07/19 1602 03/08/19 0545  WBC 6.5   < > 4.6  --   --  7.4  NEUTROABS 5.3  --   --   --   --  6.3  HGB 9.7*   < > 9.3*   < > 8.8* 8.4*  HCT 29.4*   < > 28.0*   < > 26.0* 25.5*  MCV 94.2   < > 94.6  --   --  95.1  PLT 185   < > 205  --   --  198   < > = values in this interval not displayed.    Basic Metabolic Panel:  Recent Labs  Lab 03/07/19 0542 03/07/19 1228 03/07/19 1602 03/08/19 0545  NA 136   < > 135 137  K 4.5   < > 4.2 4.2  CL 104  --   --  105  CO2 23  --   --  20*  GLUCOSE 125*  --   --  162*  BUN 52*  --   --  42*  CREATININE 3.17*  --   --  2.51*  CALCIUM 8.4*  --   --  8.2*   < > = values in this interval not displayed.   Lipid Panel:     Component Value Date/Time   CHOL 123 03/08/2019 0545   TRIG 121 03/08/2019 0545   HDL 44 03/08/2019 0545   CHOLHDL 2.8 03/08/2019 0545   VLDL 24 03/08/2019 0545   LDLCALC 55 03/08/2019 0545   HgbA1c:  Lab Results  Component Value Date   HGBA1C 5.6 03/08/2019   Urine Drug Screen:     Component Value Date/Time   LABOPIA NONE DETECTED 09/25/2012 1440   COCAINSCRNUR NONE DETECTED 09/25/2012 1440   LABBENZ NONE DETECTED 09/25/2012 1440   AMPHETMU NONE DETECTED  09/25/2012 1440   THCU NONE DETECTED 09/25/2012 1440   LABBARB NONE DETECTED 09/25/2012 1440    Alcohol Level No results found for: ETH  IMAGING past 48 hours CT ANGIO HEAD W OR WO CONTRAST  Result Date: 03/07/2019 CLINICAL DATA:  Recent fall. Acute onset of aphasia, gaze deviation, and right-sided hemiplegia. Last seen normal 1 hour ago. EXAM: CT ANGIOGRAPHY HEAD AND NECK TECHNIQUE: Multidetector CT imaging of the head and neck was performed using the standard protocol during bolus administration of intravenous contrast. Multiplanar CT image reconstructions and MIPs were obtained to evaluate the vascular anatomy. Carotid stenosis measurements (when applicable) are obtained utilizing NASCET criteria, using the distal internal carotid diameter as the denominator. CONTRAST:  Dose is  currently not available COMPARISON:  None FINDINGS: CTA NECK FINDINGS Aortic arch: Atherosclerotic plaque.  Three vessel branching. Right carotid system: Vessels are smooth and widely patent. No atheromatous changes, notable for age. Left carotid system: Vessels are smooth and widely patent. Minimal atheromatous changes at the bulb. Distal left ICA loop. Vertebral arteries: No proximal subclavian atherosclerosis. Symmetric vertebral arteries that are smooth and widely patent to the dura. Skeleton: Bulky ventral endplate spurring in the cervical and upper thoracic spine consistent with diffuse idiopathic skeletal hyperostosis. Other neck: No acute finding. Partial retropharyngeal course of the bilateral carotids. Upper chest: Mild patchy air trapping in the apical lungs. Review of the MIP images confirms the above findings CTA HEAD FINDINGS Anterior circulation: Abrupt left M1 cut off with distal reconstitution but under filled branches. There is atherosclerotic plaque along both carotid siphons with up to 50% stenosis at the right mid cavernous segment. No right-sided or anterior cerebral embolism is seen. Posterior circulation:  Calcified plaque on both carotid siphons with subjective moderate narrowing of the right vertebral artery due to calcified plaque-quantification by measurement limited due to small vessel size and plaque blooming. Subsequent mild right V4 luminal narrowing from low-density plaque. The basilar is smooth and widely patent. Symmetric robust flow in cerebellar and posterior cerebral branches. Venous sinuses: Patent Anatomic variants: None significant Review of the MIP images confirms the above findings Critical Value/emergent results were called by telephone at the time of interpretation on 03/07/2019 at 7:58 am to provider Samara Snide , who verbally acknowledged these results. IMPRESSION: 1. Emergent large vessel occlusion at the left M1 segment. There is underfilling of downstream reconstituted vessels. 2. Limited atherosclerosis in the cervical carotids with no ulceration. There is a notable left ICA loop at the skull base. 3. Moderate atheromatous narrowing at the right V4 segment. Electronically Signed   By: Monte Fantasia M.D.   On: 03/07/2019 08:07   CT ANGIO NECK W OR WO CONTRAST  Result Date: 03/07/2019 CLINICAL DATA:  Recent fall. Acute onset of aphasia, gaze deviation, and right-sided hemiplegia. Last seen normal 1 hour ago. EXAM: CT ANGIOGRAPHY HEAD AND NECK TECHNIQUE: Multidetector CT imaging of the head and neck was performed using the standard protocol during bolus administration of intravenous contrast. Multiplanar CT image reconstructions and MIPs were obtained to evaluate the vascular anatomy. Carotid stenosis measurements (when applicable) are obtained utilizing NASCET criteria, using the distal internal carotid diameter as the denominator. CONTRAST:  Dose is currently not available COMPARISON:  None FINDINGS: CTA NECK FINDINGS Aortic arch: Atherosclerotic plaque.  Three vessel branching. Right carotid system: Vessels are smooth and widely patent. No atheromatous changes, notable for age. Left  carotid system: Vessels are smooth and widely patent. Minimal atheromatous changes at the bulb. Distal left ICA loop. Vertebral arteries: No proximal subclavian atherosclerosis. Symmetric vertebral arteries that are smooth and widely patent to the dura. Skeleton: Bulky ventral endplate spurring in the cervical and upper thoracic spine consistent with diffuse idiopathic skeletal hyperostosis. Other neck: No acute finding. Partial retropharyngeal course of the bilateral carotids. Upper chest: Mild patchy air trapping in the apical lungs. Review of the MIP images confirms the above findings CTA HEAD FINDINGS Anterior circulation: Abrupt left M1 cut off with distal reconstitution but under filled branches. There is atherosclerotic plaque along both carotid siphons with up to 50% stenosis at the right mid cavernous segment. No right-sided or anterior cerebral embolism is seen. Posterior circulation: Calcified plaque on both carotid siphons with subjective moderate narrowing of the  right vertebral artery due to calcified plaque-quantification by measurement limited due to small vessel size and plaque blooming. Subsequent mild right V4 luminal narrowing from low-density plaque. The basilar is smooth and widely patent. Symmetric robust flow in cerebellar and posterior cerebral branches. Venous sinuses: Patent Anatomic variants: None significant Review of the MIP images confirms the above findings Critical Value/emergent results were called by telephone at the time of interpretation on 03/07/2019 at 7:58 am to provider Samara Snide , who verbally acknowledged these results. IMPRESSION: 1. Emergent large vessel occlusion at the left M1 segment. There is underfilling of downstream reconstituted vessels. 2. Limited atherosclerosis in the cervical carotids with no ulceration. There is a notable left ICA loop at the skull base. 3. Moderate atheromatous narrowing at the right V4 segment. Electronically Signed   By: Monte Fantasia M.D.   On: 03/07/2019 08:07   DG CHEST PORT 1 VIEW  Result Date: 03/07/2019 CLINICAL DATA:  Endotracheal tube placement. EXAM: PORTABLE CHEST 1 VIEW COMPARISON:  March 04, 2019. FINDINGS: Stable cardiomegaly. Endotracheal and nasogastric tubes appear to be in good position. No pneumothorax or pleural effusion is noted. No acute pulmonary disease is noted. Bony thorax is unremarkable. IMPRESSION: Endotracheal and nasogastric tubes in good position. No acute cardiopulmonary abnormality seen. Electronically Signed   By: Marijo Conception M.D.   On: 03/07/2019 11:34   DG Abd Portable 1V  Result Date: 03/07/2019 CLINICAL DATA:  Orogastric tube placement. EXAM: PORTABLE ABDOMEN - 1 VIEW COMPARISON:  December 12, 2014. FINDINGS: The bowel gas pattern is normal. Distal tip of enteric tube is seen in proximal stomach. No radio-opaque calculi or other significant radiographic abnormality are seen. IMPRESSION: Distal tip of enteric tube seen in proximal stomach. No evidence of bowel obstruction or ileus. Electronically Signed   By: Marijo Conception M.D.   On: 03/07/2019 12:44   CT HEAD CODE STROKE WO CONTRAST  Result Date: 03/07/2019 CLINICAL DATA:  Code stroke. Recent fall. Code stroke for new onset right hemiplegia and aphasia. EXAM: CT HEAD WITHOUT CONTRAST TECHNIQUE: Contiguous axial images were obtained from the base of the skull through the vertex without intravenous contrast. COMPARISON:  11/07/2016 FINDINGS: Brain: Acute subdural hemorrhage along the right cerebral convexity measuring up to 1 cm in thickness. Mild local mass effect with no midline shift. No acute infarct is seen on the left. Vascular: No hyperdense vessel or unexpected calcification. Skull: Normal. Negative for fracture or focal lesion. Sinuses/Orbits: Negative Other: These results were called by telephone at the time of interpretation on 03/07/2019 at 7:58 am to provider [facility] , who verbally acknowledged these results. ASPECTS  [facility] Stroke Program Early CT Score) - Ganglionic level infarction (caudate, lentiform nuclei, internal capsule, insula, M1-M3 cortex): 7 - Supraganglionic infarction (M4-M6 cortex): 3 Total score (0-10 with 10 being normal): 10 IMPRESSION: 1. Acute subdural hematoma along the right convexity measuring up to 1 cm in thickness. 2. Left MCA syndrome.  Left hemispheric ASPECTS is 10. Electronically Signed   By: Monte Fantasia M.D.   On: 03/07/2019 08:00   VAS [place] LOWER EXTREMITY VENOUS (DVT)  Result Date: 03/06/2019  Lower Venous DVTStudy Indications: Edema.  Limitations: Body habitus, poor ultrasound/tissue interface and pain tolerance. Comparison Study: no prior Performing Technologist: Abram Sander RVS  Examination Guidelines: A complete evaluation includes B-mode imaging, spectral Doppler, color Doppler, and power Doppler as needed of all accessible portions of each vessel. Bilateral testing is considered an integral part of a complete examination. Limited  examinations for reoccurring indications may be performed as noted. The reflux portion of the exam is performed with the patient in reverse Trendelenburg.  +---------+---------------+---------+-----------+----------+------------------+ RIGHT    CompressibilityPhasicitySpontaneityPropertiesThrombus Aging     +---------+---------------+---------+-----------+----------+------------------+ CFV      Full           Yes      Yes                                     +---------+---------------+---------+-----------+----------+------------------+ SFJ      Full                                                            +---------+---------------+---------+-----------+----------+------------------+ FV Prox  Full                                                            +---------+---------------+---------+-----------+----------+------------------+ FV Mid   Full                                                             +---------+---------------+---------+-----------+----------+------------------+ FV Distal               Yes      Yes                                     +---------+---------------+---------+-----------+----------+------------------+ PFV      Full                                                            +---------+---------------+---------+-----------+----------+------------------+ POP      Full           Yes      Yes                                     +---------+---------------+---------+-----------+----------+------------------+ PTV      Full                                                            +---------+---------------+---------+-----------+----------+------------------+ PERO     Full                                         limited  visualization      +---------+---------------+---------+-----------+----------+------------------+   +---------+---------------+---------+-----------+----------+--------------+ LEFT     CompressibilityPhasicitySpontaneityPropertiesThrombus Aging +---------+---------------+---------+-----------+----------+--------------+ CFV      Full           Yes      Yes                                 +---------+---------------+---------+-----------+----------+--------------+ SFJ      Full                                                        +---------+---------------+---------+-----------+----------+--------------+ FV Prox  Full                                                        +---------+---------------+---------+-----------+----------+--------------+ FV Mid   Full                                                        +---------+---------------+---------+-----------+----------+--------------+ FV Distal               Yes      Yes                                 +---------+---------------+---------+-----------+----------+--------------+  PFV      Full                                                        +---------+---------------+---------+-----------+----------+--------------+ POP      Full           Yes      Yes                                 +---------+---------------+---------+-----------+----------+--------------+ PTV      Full                                                        +---------+---------------+---------+-----------+----------+--------------+     Summary: BILATERAL: - No evidence of deep vein thrombosis seen in the lower extremities, bilaterally.   *See table(s) above for measurements and observations. Electronically signed by Deitra Mayo MD on 03/06/2019 at 55:14:12 PM.    Final    ECHOCARDIOGRAM LIMITED  Result Date: 03/07/2019    ECHOCARDIOGRAM LIMITED REPORT   Patient Name:   [name] Date of Exam: 03/07/2019 Medical Rec #:  [id number]      Height:       62.0 in Accession #:    [id number]     Weight:  242.5 lb Date of Birth:  07-25-1945       BSA:          2.07 m Patient Age:    43 years       BP:           148/67 mmHg Patient Gender: F              HR:           65 bpm. Exam Location:  Inpatient Procedure: Limited Echo and Intracardiac Opacification Agent Indications:    Stroke 434.91 / I163.9  History:        Patient has prior history of Echocardiogram examinations, most                 recent 03/05/2019. CHF; Risk Factors:Hypertension, Dyslipidemia                 and Diabetes. Elevated troponin, chronic kidney disease, chronic                 anemia.  Sonographer:    Darlina Sicilian RDCS Referring Phys: [id number] Lanice Schwab AROOR  Sonographer Comments: Per Dr. Lorraine Lax look for LV thrombus IMPRESSIONS  1. Left ventricular ejection fraction, by estimation, is 65 to 70%. The left ventricle has normal function. The left ventrical has no regional wall motion abnormalities. Left ventricular diastolic function could not be evaluated. LV filling pressures not assessed. No evidence of LV apical  thrombus by definity contrast.  2. Right ventricular systolic function was not well visualized. The right ventricular size is not well visualized.  3. The mitral valve is normal in structure and function.  4. The aortic valve was not assessed. FINDINGS  Left Ventricle: Left ventricular ejection fraction, by estimation, is 65 to 70%. The left ventricle has normal function. The left ventricle has no regional wall motion abnormalities. The left ventricular internal cavity size was normal in size. There is  no left ventricular hypertrophy. LV filling pressures not assessed. o evidence of LV apical thrombus by definity contrast Right Ventricle: The right ventricular size is not well visualized. Right vetricular wall thickness was not assessed. Right ventricular systolic function was not well visualized. Left Atrium: Left atrial size was not assessed. Right Atrium: Right atrial size was not assessed. Pericardium: Trivial pericardial effusion is present. The pericardial effusion is posterior to the left ventricle. Mitral Valve: The mitral valve is normal in structure and function. Normal mobility of the mitral valve leaflets. Tricuspid Valve: The tricuspid valve is not assessed. Aortic Valve: The aortic valve was not assessed. Pulmonic Valve: The pulmonic valve was not assessed. Aorta: Aortic root could not be assessed. Venous: The inferior vena cava was not well visualized. The inferior vena cava and the hepatic vein show a normal flow pattern. IAS/Shunts: No atrial level shunt detected by color flow Doppler. Fransico Him MD Electronically signed by Fransico Him MD Signature Date/Time: 03/07/2019/11:26:44 AM    Final    Cerebral Angio 03/07/2019 S/P Lt common carotid arteriogram followed by complete revascularization  Of occluded Lt LMCA M 1 seg with x 1 pass woith 76mmx 40 mm  Solitaire X retriever device with penumbra aspiration  Achieving a TICI 3 revascularization.   PHYSICAL EXAM  Temp:  [96.8 F (36 C)-100.5  F (38.1 C)] 100.5 F (38.1 C) (02/11 0820) Pulse Rate:  [52-83] 76 (02/11 1000) Resp:  [0-23] 22 (02/11 1000) BP: (57-160)/(13-118) 123/92 (02/11 1100) SpO2:  [91 %-100 %] 99 % (02/11 1000) Arterial Line  BP: (70-298)/(41-294) 75/65 (02/11 0800) FiO2 (%):  [30 %-40 %] 30 % (02/11 0812)  General - Well nourished, well developed, in no apparent distress.  Ophthalmologic - fundi not visualized due to noncooperation.  Cardiovascular - Regular rhythm and rate.  Mental Status -  Level of arousal and orientation to time, place, and person were intact. Language including expression, naming, repetition, comprehension was assessed and found intact. Mild dysarthria due to poor denture  Cranial Nerves II - XII - II - Visual field intact OU. III, IV, VI - Extraocular movements intact. V - Facial sensation intact bilaterally. VII - slight left nasolabial fold flattening. VIII - Hearing & vestibular intact bilaterally. X - Palate elevates symmetrically. XI - Chin turning & shoulder shrug intact bilaterally. XII - Tongue protrusion intact.  Motor Strength - The patient's strength was symmetric in all extremities and pronator drift was absent.  Bulk was normal and fasciculations were absent.   Motor Tone - Muscle tone was assessed at the neck and appendages and was normal.  Reflexes - The patient's reflexes were symmetrical in all extremities and she had no pathological reflexes.  Sensory - Light touch, temperature/pinprick were assessed and were slight decreased light touch on the left     Coordination - The patient had normal movements in the hands with no ataxia or dysmetria.  Tremor was absent.  Gait and Station - deferred.   ASSESSMENT/PLAN [name] is a 74 y.o. female with history of chronic diastolic heart failure, diabetes mellitus, hypertension, CKD stage IV, morbid obesity, obstructive sleep apnea, first-degree AV block admitted to [facility] cardiology service for acute  on chronic heart failure 2 days ago on 03/04/19 who in hospital developed aphasia, R hemiparesis and L gaze. Found to have a R SDH from falls PTA, so no tPA and a L M1 occlusion for which she was taken to IR.  Stroke:   L MCA infarct s/p IR w/ TICI3 revascularization L M1, infarct embolic, source unclear  CT head R SDH. ASPECTS 10.     CTA head & neck ELVO L M1. Cervical ICA atherosclerosis. Moderate R V4 narrowing.   Cerebral angio L M1 occlusion w/ TICI3 revascularization   Post IR CT with no new ICH or mass effect  MRI pending  MRA  pending  LE Doppler 2/7 neg for DVT   2D Echo EF 65-70%. No source of embolus   LDL 55  HgbA1c 5.6  SCDs for VTE prophylaxis  aspirin 81 mg daily prior to admission, now on ASA 81mg . Not candidate for DAPT or anticoagulation at this time due to right SDH  Therapy recommendations:  pending   Disposition:  pending   Right SDH  Likely due to falls at home PTA  CT showed right SDH without midline shift, mild mass effect  MRI pending  Will resume ASA 81 as pt has been on ASA for several days during admission  No DAPT or AC at this time.  Acute on chronic diastolic Congestive heart failure (HFpEF) Elevated troponin  Troponin has been flat 400s  On lasix  D/c IVF  strict I&Os, daily wts  Cardiology following  Hypertension  Home meds amlodipine 5, hydralazine 25 tid, lasix 40  Home meds on hold x lasix  BP dropped after Cleviprex  Fluid bolus early am for BP 100-110s . BP goal < 160 due to SDH . Long-term BP goal normotensive  Hyperlipidemia  Home meds:  vytorin (ezetimibe 10/simvastatin 40), resumed in hospital  LDL 55, goal < 70  Will not use intensive statin given appropriate LDL goal    Continue statin at discharge  Diabetes type II Controlled  Home meds:  Glipizide 5, humulin 70/30 5u bid  HgbA1c 5.6, goal < 7.0  CBG monitoring  SSI  PCP follow up  Hx Seizures  On tegretol 400 bid  Following with  Dr. Jaynee Eagles at [facility]  Dysphagia . Secondary to stroke . NPO . Speech on board . Pending swallow eval    Other Stroke Risk Factors  Advanced age  Morbid Obesity, Body mass index is 44.35 kg/m., recommend weight loss, diet and exercise as appropriate   Coronary artery disease  Obstructive sleep apnea, CPAP status unknown   Other Active Problems  CKD stage IV, improving Cre 3.53->3.17-> 2.51  Hospital day # 3  This patient is critically ill due to left MCA infarct s/p thrombectomy, CHF, DM, hx of seizure, hypotension/hypertension and at significant risk of neurological worsening, death form recurrent stroke, hemorrhagic conversion, heart failure, cardiogenic shock, DKA, seizure, status epilepticus. This patient's care requires constant monitoring of vital signs, hemodynamics, respiratory and cardiac monitoring, review of multiple databases, neurological assessment, discussion with family, other specialists and medical decision making of high complexity. I spent 35 minutes of neurocritical care time in the care of this patient.  Rosalin Hawking, MD PhD Stroke Neurology 03/08/2019 4:54 PM   To contact Stroke Continuity provider, please refer to [email or web address] After hours, contact General Neurology

## 2019-03-08 NOTE — Anesthesia Postprocedure Evaluation (Signed)
Anesthesia Post Note  Patient: [name]  Procedure(s) Performed: IR WITH ANESTHESIA (N/A )     Patient location during evaluation: SICU Anesthesia Type: General Level of consciousness: sedated Pain management: pain level controlled Vital Signs Assessment: post-procedure vital signs reviewed and stable Respiratory status: patient remains intubated per anesthesia plan Cardiovascular status: stable Postop Assessment: no apparent nausea or vomiting Anesthetic complications: no    Last Vitals:  Vitals:   03/08/19 1000 03/08/19 1100  BP: 109/79 (!) 123/92  Pulse: 76   Resp: (!) 22   Temp:    SpO2: 99%     Last Pain:  Vitals:   03/08/19 0820  TempSrc: Axillary  PainSc:                  Zigmund Linse

## 2019-03-08 NOTE — Procedures (Signed)
Extubation Procedure Note  Patient Details:   Name: [name] DOB: 02-Aug-1945 MRN: [id number]   Airway Documentation:    Vent end date: 03/08/19 Vent end time: 0901   Evaluation  O2 sats: stable throughout Complications: No apparent complications Patient did tolerate procedure well. Bilateral Breath Sounds: Clear, Diminished   Yes   Patient was extubated to a 4L [place] without any complications, dyspnea or stridor noted. Patient was instructed on IS x 5, highest goal achieved was 729mL. Positive cuff leak.   Beau Vanduzer, Eddie North 03/08/2019, 9:01 AM

## 2019-03-08 NOTE — Progress Notes (Signed)
Physical Therapy Treatment Patient Details Name: [name] MRN: [id number] DOB: [birth year] Today's Date: 03/08/2019    History of Present Illness Patient is a 74 y/o female who presents with dyspnea on exertion and multiple falls at home. Admitted with acute on chronic CHF. PMH includes CHF, thoracic scoliosis, CKD stage 4, obesity, HTN, DVT, seizures, DM.    PT Comments    Pt a little apathetic about therapy, but agreed to participate.  Emphasis on transition to EOB including scooting, sit to stand and progressing ambulation to patient's tolerance.    Follow Up Recommendations  SNF;Supervision for mobility/OOB     Equipment Recommendations  None recommended by PT    Recommendations for Other Services       Precautions / Restrictions Precautions Precautions: Fall Precaution Comments: 3 falls prior to admission    Mobility  Bed Mobility Overal bed mobility: Needs Assistance Bed Mobility: Supine to Sit Rolling: Min assist(HOB raised)         General bed mobility comments: Able to bring LEs to EOB, assist with trunk to get upright and scoot bottom.  Transfers Overall transfer level: Needs assistance Equipment used: Rolling walker (2 wheeled) Transfers: Sit to/from Stand Sit to Stand: Min assist;+2 physical assistance         General transfer comment: cues for hand placement.  some assist to come forward and to boost up.  Ambulation/Gait Ambulation/Gait assistance: Min assist;+2 safety/equipment Gait Distance (Feet): 12 Feet Assistive device: Rolling walker (2 wheeled) Gait Pattern/deviations: Step-through pattern;Shuffle;Wide base of support Gait velocity: decreased Gait velocity interpretation: <1.31 ft/sec, indicative of household ambulator General Gait Details: pt guarded and with short shuffled steps.  Need RW assist even though she is used to using a Programmer, systems Rankin (Stroke Patients Only)        Balance Overall balance assessment: Needs assistance;History of Falls Sitting-balance support: Feet supported;Single extremity supported;No upper extremity supported Sitting balance-Leahy Scale: Fair Sitting balance - Comments: Supervision for safety.   Standing balance support: During functional activity Standing balance-Leahy Scale: Poor Standing balance comment: reliant on UE support on an AD and min assist for dynamic stability presently                            Cognition Arousal/Alertness: Awake/alert Behavior During Therapy: WFL for tasks assessed/performed Overall Cognitive Status: Within Functional Limits for tasks assessed                                        Exercises Other Exercises Other Exercises: warm up ROM exercise for knees/hips bil    General Comments General comments (skin integrity, edema, etc.): On RA sensor reading labile, but generally looked to be in the low 90's %      Pertinent Vitals/Pain Pain Assessment: Faces Faces Pain Scale: Hurts a little bit Pain Location: left hip/buttock Pain Descriptors / Indicators: Sore;Aching Pain Intervention(s): Monitored during session    Home Living                      Prior Function            PT Goals (current goals can now be found in the care plan section) Acute Rehab PT Goals Patient Stated Goal:  to stop falling PT Goal Formulation: With patient Time For Goal Achievement: 03/19/19 Potential to Achieve Goals: Good Progress towards PT goals: Progressing toward goals    Frequency    Min 3X/week      PT Plan Current plan remains appropriate    Co-evaluation              AM-PAC PT "6 Clicks" Mobility   Outcome Measure  Help needed turning from your back to your side while in a flat bed without using bedrails?: A Little Help needed moving from lying on your back to sitting on the side of a flat bed without using bedrails?: A Little Help  needed moving to and from a bed to a chair (including a wheelchair)?: A Little   Help needed to walk in hospital room?: A Little Help needed climbing 3-5 steps with a railing? : A Lot 6 Click Score: 14    End of Session   Activity Tolerance: Patient tolerated treatment well Patient left: in bed;with call bell/phone within reach;with family/visitor present Nurse Communication: Mobility status PT Visit Diagnosis: Muscle weakness (generalized) (M62.81);Unsteadiness on feet (R26.81);Pain Pain - Right/Left: Left Pain - part of body: Hip     Time: 1040-1109 PT Time Calculation (min) (ACUTE ONLY): 29 min  Charges:  $Gait Training: 8-22 mins                     03/08/2019  Ginger Carne., PT Acute Rehabilitation Services [phone]  (pager) [phone]  (office)   [name] 03/08/2019, 12:13 PM

## 2019-03-08 NOTE — Progress Notes (Addendum)
Progress Note  Patient Name: [name] Date of Encounter: 03/08/2019  Primary Cardiologist: Lauree Chandler, MD   Subjective   Successfully extubated. Feeling well this morning. No chest pain or shortness of breath.   Inpatient Medications    Scheduled Meds: .  stroke: mapping our early stages of recovery book   Does not apply Once  . carbamazepine  400 mg Per Tube BID  . chlorhexidine gluconate (MEDLINE KIT)  15 mL Mouth Rinse BID  . Chlorhexidine Gluconate Cloth  6 each Topical Daily  . citalopram  20 mg Oral Daily  . ezetimibe  10 mg Per Tube Daily   And  . simvastatin  40 mg Per Tube Daily  . furosemide  40 mg Per Tube Daily  . insulin aspart  0-5 Units Subcutaneous QHS  . insulin aspart  0-6 Units Subcutaneous TID WC  . mouth rinse  15 mL Mouth Rinse 10 times per day  . mirtazapine  15 mg Per Tube QHS  . pantoprazole sodium  40 mg Per Tube Daily  . sodium chloride flush  3 mL Intravenous Q12H   Continuous Infusions: . sodium chloride    . sodium chloride    . sodium chloride 75 mL/hr at 03/08/19 0700  . clevidipine Stopped (03/08/19 0416)  . propofol (DIPRIVAN) infusion Stopped (03/08/19 0825)   PRN Meds: sodium chloride, acetaminophen **OR** acetaminophen (TYLENOL) oral liquid 160 mg/5 mL **OR** acetaminophen, acetaminophen, fentaNYL (SUBLIMAZE) injection, fentaNYL (SUBLIMAZE) injection, hydrALAZINE, nitroGLYCERIN, ondansetron (ZOFRAN) IV, sodium chloride flush   Vital Signs    Vitals:   03/08/19 0630 03/08/19 0800 03/08/19 0812 03/08/19 0820  BP: 120/83 116/68 93/69   Pulse: 67 66 72   Resp: '20 20 11   '$ Temp:    (!) 100.5 F (38.1 C)  TempSrc:    Axillary  SpO2: 100% 100% 99%   Weight:      Height:        Intake/Output Summary (Last 24 hours) at 03/08/2019 0901 Last data filed at 03/08/2019 0800 Gross per 24 hour  Intake 3146.53 ml  Output 950 ml  Net 2196.53 ml   Last 3 Weights 03/07/2019 03/06/2019 03/05/2019  Weight (lbs) 242 lb 8.1 oz 241  lb 6.5 oz 238 lb 15.7 oz  Weight (kg) 110 kg 109.5 kg 108.4 kg      Telemetry    NSR - Personally Reviewed  ECG    No new tracings - Personally Reviewed  Physical Exam   GEN: No acute distress.   Neck: No JVD Cardiac: RRR, no murmurs, rubs, or gallops.  Respiratory: Clear to auscultation bilaterally. GI: Soft, nontender, non-distended  MS: trace edema BLE; No deformity. Neuro:  alert and oriented; Speech clear and coherent; RUE with 5/5 strength Psych: Normal affect   Labs    High Sensitivity Troponin:   Recent Labs  Lab 03/04/19 0954 03/04/19 1154 03/04/19 1557 03/04/19 1809  TROPONINIHS 483* 495* 456* 431*      Chemistry Recent Labs  Lab 03/02/19 0855 03/04/19 0954 03/06/19 0846 03/06/19 0846 03/07/19 0542 03/07/19 0542 03/07/19 1228 03/07/19 1602 03/08/19 0545  NA 135   < > 136   < > 136   < > 136 135 137  K 4.1   < > 4.5   < > 4.5   < > 4.2 4.2 4.2  CL 101   < > 101  --  104  --   --   --  105  CO2 21*   < >  25  --  23  --   --   --  20*  GLUCOSE 250*   < > 192*  --  125*  --   --   --  162*  BUN 60*   < > 56*  --  52*  --   --   --  42*  CREATININE 3.43*   < > 3.34*  --  3.17*  --   --   --  2.51*  CALCIUM 8.8*   < > 8.3*  --  8.4*  --   --   --  8.2*  PROT 7.4  --   --   --   --   --   --   --   --   ALBUMIN 4.0  --   --   --   --   --   --   --   --   AST 17  --   --   --   --   --   --   --   --   ALT 13  --   --   --   --   --   --   --   --   ALKPHOS 101  --   --   --   --   --   --   --   --   BILITOT 1.0  --   --   --   --   --   --   --   --   GFRNONAA 13*   < > 13*  --  14*  --   --   --  19*  GFRAA 15*   < > 15*  --  16*  --   --   --  22*  ANIONGAP 13   < > 10  --  9  --   --   --  12   < > = values in this interval not displayed.     Hematology Recent Labs  Lab 03/04/19 1557 03/04/19 1557 03/06/19 5329 03/06/19 0853 03/07/19 1228 03/07/19 1602 03/08/19 0545  WBC 6.0  --  4.6  --   --   --  7.4  RBC 3.04*  --  2.96*  --    --   --  2.68*  HGB 9.6*   < > 9.3*   < > 9.2* 8.8* 8.4*  HCT 27.5*   < > 28.0*   < > 27.0* 26.0* 25.5*  MCV 90.5  --  94.6  --   --   --  95.1  MCH 31.6  --  31.4  --   --   --  31.3  MCHC 34.9  --  33.2  --   --   --  32.9  RDW 11.9  --  12.1  --   --   --  12.5  PLT 182  --  205  --   --   --  198   < > = values in this interval not displayed.    BNP Recent Labs  Lab 03/04/19 0954  BNP 1,112.8*     DDimer  Recent Labs  Lab 03/06/19 1013  DDIMER 2.20*     Radiology    CT ANGIO HEAD W OR WO CONTRAST  Result Date: 03/07/2019 CLINICAL DATA:  Recent fall. Acute onset of aphasia, gaze deviation, and right-sided hemiplegia. Last seen normal 1 hour ago. EXAM: CT ANGIOGRAPHY HEAD AND NECK TECHNIQUE:  Multidetector CT imaging of the head and neck was performed using the standard protocol during bolus administration of intravenous contrast. Multiplanar CT image reconstructions and MIPs were obtained to evaluate the vascular anatomy. Carotid stenosis measurements (when applicable) are obtained utilizing NASCET criteria, using the distal internal carotid diameter as the denominator. CONTRAST:  Dose is currently not available COMPARISON:  None FINDINGS: CTA NECK FINDINGS Aortic arch: Atherosclerotic plaque.  Three vessel branching. Right carotid system: Vessels are smooth and widely patent. No atheromatous changes, notable for age. Left carotid system: Vessels are smooth and widely patent. Minimal atheromatous changes at the bulb. Distal left ICA loop. Vertebral arteries: No proximal subclavian atherosclerosis. Symmetric vertebral arteries that are smooth and widely patent to the dura. Skeleton: Bulky ventral endplate spurring in the cervical and upper thoracic spine consistent with diffuse idiopathic skeletal hyperostosis. Other neck: No acute finding. Partial retropharyngeal course of the bilateral carotids. Upper chest: Mild patchy air trapping in the apical lungs. Review of the MIP images  confirms the above findings CTA HEAD FINDINGS Anterior circulation: Abrupt left M1 cut off with distal reconstitution but under filled branches. There is atherosclerotic plaque along both carotid siphons with up to 50% stenosis at the right mid cavernous segment. No right-sided or anterior cerebral embolism is seen. Posterior circulation: Calcified plaque on both carotid siphons with subjective moderate narrowing of the right vertebral artery due to calcified plaque-quantification by measurement limited due to small vessel size and plaque blooming. Subsequent mild right V4 luminal narrowing from low-density plaque. The basilar is smooth and widely patent. Symmetric robust flow in cerebellar and posterior cerebral branches. Venous sinuses: Patent Anatomic variants: None significant Review of the MIP images confirms the above findings Critical Value/emergent results were called by telephone at the time of interpretation on 03/07/2019 at 7:58 am to provider Samara Snide , who verbally acknowledged these results. IMPRESSION: 1. Emergent large vessel occlusion at the left M1 segment. There is underfilling of downstream reconstituted vessels. 2. Limited atherosclerosis in the cervical carotids with no ulceration. There is a notable left ICA loop at the skull base. 3. Moderate atheromatous narrowing at the right V4 segment. Electronically Signed   By: Monte Fantasia M.D.   On: 03/07/2019 08:07   CT ANGIO NECK W OR WO CONTRAST  Result Date: 03/07/2019 CLINICAL DATA:  Recent fall. Acute onset of aphasia, gaze deviation, and right-sided hemiplegia. Last seen normal 1 hour ago. EXAM: CT ANGIOGRAPHY HEAD AND NECK TECHNIQUE: Multidetector CT imaging of the head and neck was performed using the standard protocol during bolus administration of intravenous contrast. Multiplanar CT image reconstructions and MIPs were obtained to evaluate the vascular anatomy. Carotid stenosis measurements (when applicable) are obtained  utilizing NASCET criteria, using the distal internal carotid diameter as the denominator. CONTRAST:  Dose is currently not available COMPARISON:  None FINDINGS: CTA NECK FINDINGS Aortic arch: Atherosclerotic plaque.  Three vessel branching. Right carotid system: Vessels are smooth and widely patent. No atheromatous changes, notable for age. Left carotid system: Vessels are smooth and widely patent. Minimal atheromatous changes at the bulb. Distal left ICA loop. Vertebral arteries: No proximal subclavian atherosclerosis. Symmetric vertebral arteries that are smooth and widely patent to the dura. Skeleton: Bulky ventral endplate spurring in the cervical and upper thoracic spine consistent with diffuse idiopathic skeletal hyperostosis. Other neck: No acute finding. Partial retropharyngeal course of the bilateral carotids. Upper chest: Mild patchy air trapping in the apical lungs. Review of the MIP images confirms the above findings CTA HEAD  FINDINGS Anterior circulation: Abrupt left M1 cut off with distal reconstitution but under filled branches. There is atherosclerotic plaque along both carotid siphons with up to 50% stenosis at the right mid cavernous segment. No right-sided or anterior cerebral embolism is seen. Posterior circulation: Calcified plaque on both carotid siphons with subjective moderate narrowing of the right vertebral artery due to calcified plaque-quantification by measurement limited due to small vessel size and plaque blooming. Subsequent mild right V4 luminal narrowing from low-density plaque. The basilar is smooth and widely patent. Symmetric robust flow in cerebellar and posterior cerebral branches. Venous sinuses: Patent Anatomic variants: None significant Review of the MIP images confirms the above findings Critical Value/emergent results were called by telephone at the time of interpretation on 03/07/2019 at 7:58 am to provider Samara Snide , who verbally acknowledged these results.  IMPRESSION: 1. Emergent large vessel occlusion at the left M1 segment. There is underfilling of downstream reconstituted vessels. 2. Limited atherosclerosis in the cervical carotids with no ulceration. There is a notable left ICA loop at the skull base. 3. Moderate atheromatous narrowing at the right V4 segment. Electronically Signed   By: Monte Fantasia M.D.   On: 03/07/2019 08:07   DG CHEST PORT 1 VIEW  Result Date: 03/07/2019 CLINICAL DATA:  Endotracheal tube placement. EXAM: PORTABLE CHEST 1 VIEW COMPARISON:  March 04, 2019. FINDINGS: Stable cardiomegaly. Endotracheal and nasogastric tubes appear to be in good position. No pneumothorax or pleural effusion is noted. No acute pulmonary disease is noted. Bony thorax is unremarkable. IMPRESSION: Endotracheal and nasogastric tubes in good position. No acute cardiopulmonary abnormality seen. Electronically Signed   By: Marijo Conception M.D.   On: 03/07/2019 11:34   DG Abd Portable 1V  Result Date: 03/07/2019 CLINICAL DATA:  Orogastric tube placement. EXAM: PORTABLE ABDOMEN - 1 VIEW COMPARISON:  December 12, 2014. FINDINGS: The bowel gas pattern is normal. Distal tip of enteric tube is seen in proximal stomach. No radio-opaque calculi or other significant radiographic abnormality are seen. IMPRESSION: Distal tip of enteric tube seen in proximal stomach. No evidence of bowel obstruction or ileus. Electronically Signed   By: Marijo Conception M.D.   On: 03/07/2019 12:44   CT HEAD CODE STROKE WO CONTRAST  Result Date: 03/07/2019 CLINICAL DATA:  Code stroke. Recent fall. Code stroke for new onset right hemiplegia and aphasia. EXAM: CT HEAD WITHOUT CONTRAST TECHNIQUE: Contiguous axial images were obtained from the base of the skull through the vertex without intravenous contrast. COMPARISON:  11/07/2016 FINDINGS: Brain: Acute subdural hemorrhage along the right cerebral convexity measuring up to 1 cm in thickness. Mild local mass effect with no midline shift. No  acute infarct is seen on the left. Vascular: No hyperdense vessel or unexpected calcification. Skull: Normal. Negative for fracture or focal lesion. Sinuses/Orbits: Negative Other: These results were called by telephone at the time of interpretation on 03/07/2019 at 7:58 am to provider [facility] , who verbally acknowledged these results. ASPECTS [facility] Stroke Program Early CT Score) - Ganglionic level infarction (caudate, lentiform nuclei, internal capsule, insula, M1-M3 cortex): 7 - Supraganglionic infarction (M4-M6 cortex): 3 Total score (0-10 with 10 being normal): 10 IMPRESSION: 1. Acute subdural hematoma along the right convexity measuring up to 1 cm in thickness. 2. Left MCA syndrome.  Left hemispheric ASPECTS is 10. Electronically Signed   By: Monte Fantasia M.D.   On: 03/07/2019 08:00   ECHOCARDIOGRAM LIMITED  Result Date: 03/07/2019    ECHOCARDIOGRAM LIMITED REPORT   Patient Name:  Barney Drain Ewen Date of Exam: 03/07/2019 Medical Rec #:  [id number]      Height:       62.0 in Accession #:    [id number]     Weight:       242.5 lb Date of Birth:  08-04-1945       BSA:          2.07 m Patient Age:    10 years       BP:           148/67 mmHg Patient Gender: F              HR:           65 bpm. Exam Location:  Inpatient Procedure: Limited Echo and Intracardiac Opacification Agent Indications:    Stroke 434.91 / I163.9  History:        Patient has prior history of Echocardiogram examinations, most                 recent 03/05/2019. CHF; Risk Factors:Hypertension, Dyslipidemia                 and Diabetes. Elevated troponin, chronic kidney disease, chronic                 anemia.  Sonographer:    Darlina Sicilian RDCS Referring Phys: [id number] Lanice Schwab AROOR  Sonographer Comments: Per Dr. Lorraine Lax look for LV thrombus IMPRESSIONS  1. Left ventricular ejection fraction, by estimation, is 65 to 70%. The left ventricle has normal function. The left ventrical has no regional wall motion abnormalities. Left ventricular  diastolic function could not be evaluated. LV filling pressures not assessed. No evidence of LV apical thrombus by definity contrast.  2. Right ventricular systolic function was not well visualized. The right ventricular size is not well visualized.  3. The mitral valve is normal in structure and function.  4. The aortic valve was not assessed. FINDINGS  Left Ventricle: Left ventricular ejection fraction, by estimation, is 65 to 70%. The left ventricle has normal function. The left ventricle has no regional wall motion abnormalities. The left ventricular internal cavity size was normal in size. There is  no left ventricular hypertrophy. LV filling pressures not assessed. o evidence of LV apical thrombus by definity contrast Right Ventricle: The right ventricular size is not well visualized. Right vetricular wall thickness was not assessed. Right ventricular systolic function was not well visualized. Left Atrium: Left atrial size was not assessed. Right Atrium: Right atrial size was not assessed. Pericardium: Trivial pericardial effusion is present. The pericardial effusion is posterior to the left ventricle. Mitral Valve: The mitral valve is normal in structure and function. Normal mobility of the mitral valve leaflets. Tricuspid Valve: The tricuspid valve is not assessed. Aortic Valve: The aortic valve was not assessed. Pulmonic Valve: The pulmonic valve was not assessed. Aorta: Aortic root could not be assessed. Venous: The inferior vena cava was not well visualized. The inferior vena cava and the hepatic vein show a normal flow pattern. IAS/Shunts: No atrial level shunt detected by color flow Doppler. Fransico Him MD Electronically signed by Fransico Him MD Signature Date/Time: 03/07/2019/11:26:44 AM    Final     Cardiac Studies   TTE 03/07/19 1. Left ventricular ejection fraction, by estimation, is 65 to 70%. The  left ventricle has normal function. The left ventrical has no regional  wall motion  abnormalities. Left ventricular diastolic function could not  be evaluated. LV filling pressures  not assessed. No evidence of LV apical thrombus by definity contrast.  2. Right ventricular systolic function was not well visualized. The right  ventricular size is not well visualized.  3. The mitral valve is normal in structure and function.  4. The aortic valve was not assessed.   Patient Profile     74 y.o. female with hx of HFpEF, obesity, HTN, HLD, DM, CKD stage IV, bradycardia who was admitted for heart failure exacerbation. Hospital course complicated by acute left ischemic stroke secondary to M1 occlusion. She underwent successful mechanical thrombectomy on 03/07/19.   Assessment & Plan    Acute CVA 2/2 M1 occlusion s/p mechanical thrombectomy Right-sided subdural hematoma  -appreciate neurology and CCM following -avoiding anti-platelets due to subdural hemorrhage -BP goal 120-140; using clevidipine drip for tight control; other anti-hypertensives on hold -repeat echo yesterday without evidence of LV thrombus    Acute on chronic diastolic HF -patient is +4P; received IVF following contrast yesterday -would d/c fluids given stable renal function and BP; continue Lasix 40 mg daily -continue strict I&Os; daily weights  CKD IV -creatinine has improved to 2.5 which is actually the best it has been since admission -at risk for worsening given contrast load yesterday for diagnostic testing and procedure for acute stroke -have discontinued fluids for now  -continue trending BMPs   HTN -home meds on hold -BP goal 120-140 as above   For questions or updates, please contact [facility] Please consult [email or web address] for contact info under     Signed, Delice Bison, DO  03/08/2019, 9:01 AM    Patient seen and examined.  Agree with above documentation.  Patient was extubated this morning.  She reports she is feeling well, denies any chest pain.  Does report some shortness  of breath.  On exam, patient is alert and oriented, regular rate and rhythm, no murmurs, lungs mild crackles, no LE edema, JVD difficult to assess given body habitus.  Telemetry personally reviewed, shows normal sinus rhythm with rate in 60s.  Labs notable for improvement in renal function (creatinine 2.5).  We will continue to monitor renal function given contrast load yesterday.  Would continue p.o. Lasix, as appears mildly volume overloaded.  Donato Heinz, MD

## 2019-03-08 NOTE — Addendum Note (Signed)
Addendum  created 03/08/19 1353 by Oleta Mouse, MD   Intraprocedure Staff edited

## 2019-03-08 NOTE — Progress Notes (Signed)
Referring Physician(s): Dr. Lorraine Lax  Supervising Physician: Luanne Bras  Patient Status:  [facility] - In-pt  Chief Complaint: L MCA CVA  Subjective: Patient remains intubated this AM.  She is awake and alert. Follows commands.  Moving all extremities.  She indicates she would like tube removed.  Extubation readiness being assessed by CCM.   Allergies: Codeine and Metoprolol  Medications: Prior to Admission medications   Medication Sig Start Date End Date Taking? Authorizing Provider  amLODipine (NORVASC) 10 MG tablet Take 10 mg by mouth daily.   Yes [provider]  Ascorbic Acid (VITAMIN C PO) Take 1 tablet by mouth daily.   Yes [provider]  aspirin 81 MG chewable tablet Chew 1 tablet (81 mg total) by mouth daily. 12/21/14  Yes Rai, Ripudeep K, MD  bisacodyl (DULCOLAX) 5 MG EC tablet Take 2 tablets (10 mg total) by mouth daily as needed. 05/06/17  Yes Aline August, MD  carbamazepine (TEGRETOL) 200 MG tablet Take 400 mg by mouth 2 (two) times daily.    Yes [provider]  Cholecalciferol (VITAMIN D-3) 1000 UNITS CAPS Take 1,000 Units by mouth daily at 12 noon.    Yes [provider]  citalopram (CELEXA) 20 MG tablet Take 20 mg by mouth daily.   Yes [provider]  DUREZOL 0.05 % EMUL Place 1 drop into the right eye 3 (three) times daily. 02/22/19  Yes [provider]  ezetimibe-simvastatin (VYTORIN) 10-40 MG per tablet Take 1 tablet by mouth daily.   Yes [provider]  furosemide (LASIX) 80 MG tablet Take 0.5 tablets (40 mg total) by mouth daily. 05/06/17  Yes Aline August, MD  glipiZIDE (GLUCOTROL XL) 5 MG 24 hr tablet Take 5 mg by mouth daily with breakfast.  03/24/18  Yes [provider]  HUMULIN 70/30 KWIKPEN (70-30) 100 UNIT/ML PEN Inject 5 Units into the skin 2 (two) times daily. 05/06/17  Yes Aline August, MD  hydrALAZINE (APRESOLINE) 25 MG tablet Take 1 tablet (25 mg total) by mouth 3 (three)  times daily. 05/06/17  Yes Aline August, MD  mirtazapine (REMERON) 15 MG tablet Take 15 mg by mouth at bedtime.  05/06/18  Yes [provider]  omeprazole (PRILOSEC) 40 MG capsule Take 40 mg by mouth daily.   Yes [provider]  ACCU-CHEK AVIVA PLUS test strip 3 (three) times daily. for testing 04/14/18   [provider]  Accu-Chek Softclix Lancets lancets See admin instructions. 12/29/17   [provider]  B-D ULTRAFINE III SHORT PEN 31G X 8 MM MISC See admin instructions. 04/18/18   [provider]     Vital Signs: BP (!) 123/92   Pulse 76   Temp (!) 100.5 F (38.1 C) (Axillary)   Resp (!) 22   Ht 5\' 2"  (1.575 m)   Wt 242 lb 8.1 oz (110 kg)   SpO2 99%   BMI 44.35 kg/m   Physical Exam  NAD, alert, intubated Neuro: Follows commands. EOMs intact. Attempting to talk around tube. Moving all extremities.  Groin: soft, non-tender. No evidence of hematoma or pseudoaneurysm.  Pulses: R DP identified by doppler.   Imaging: CT ANGIO HEAD W OR WO CONTRAST  Result Date: 03/07/2019 CLINICAL DATA:  Recent fall. Acute onset of aphasia, gaze deviation, and right-sided hemiplegia. Last seen normal 1 hour ago. EXAM: CT ANGIOGRAPHY HEAD AND NECK TECHNIQUE: Multidetector CT imaging of the head and neck was performed using the standard protocol during bolus administration of  intravenous contrast. Multiplanar CT image reconstructions and MIPs were obtained to evaluate the vascular anatomy. Carotid stenosis measurements (when applicable) are obtained utilizing NASCET criteria, using the distal internal carotid diameter as the denominator. CONTRAST:  Dose is currently not available COMPARISON:  None FINDINGS: CTA NECK FINDINGS Aortic arch: Atherosclerotic plaque.  Three vessel branching. Right carotid system: Vessels are smooth and widely patent. No atheromatous changes, notable for age. Left carotid system: Vessels are smooth and widely patent. Minimal atheromatous  changes at the bulb. Distal left ICA loop. Vertebral arteries: No proximal subclavian atherosclerosis. Symmetric vertebral arteries that are smooth and widely patent to the dura. Skeleton: Bulky ventral endplate spurring in the cervical and upper thoracic spine consistent with diffuse idiopathic skeletal hyperostosis. Other neck: No acute finding. Partial retropharyngeal course of the bilateral carotids. Upper chest: Mild patchy air trapping in the apical lungs. Review of the MIP images confirms the above findings CTA HEAD FINDINGS Anterior circulation: Abrupt left M1 cut off with distal reconstitution but under filled branches. There is atherosclerotic plaque along both carotid siphons with up to 50% stenosis at the right mid cavernous segment. No right-sided or anterior cerebral embolism is seen. Posterior circulation: Calcified plaque on both carotid siphons with subjective moderate narrowing of the right vertebral artery due to calcified plaque-quantification by measurement limited due to small vessel size and plaque blooming. Subsequent mild right V4 luminal narrowing from low-density plaque. The basilar is smooth and widely patent. Symmetric robust flow in cerebellar and posterior cerebral branches. Venous sinuses: Patent Anatomic variants: None significant Review of the MIP images confirms the above findings Critical Value/emergent results were called by telephone at the time of interpretation on 03/07/2019 at 7:58 am to provider Samara Snide , who verbally acknowledged these results. IMPRESSION: 1. Emergent large vessel occlusion at the left M1 segment. There is underfilling of downstream reconstituted vessels. 2. Limited atherosclerosis in the cervical carotids with no ulceration. There is a notable left ICA loop at the skull base. 3. Moderate atheromatous narrowing at the right V4 segment. Electronically Signed   By: Monte Fantasia M.D.   On: 03/07/2019 08:07   CT ANGIO NECK W OR WO CONTRAST  Result  Date: 03/07/2019 CLINICAL DATA:  Recent fall. Acute onset of aphasia, gaze deviation, and right-sided hemiplegia. Last seen normal 1 hour ago. EXAM: CT ANGIOGRAPHY HEAD AND NECK TECHNIQUE: Multidetector CT imaging of the head and neck was performed using the standard protocol during bolus administration of intravenous contrast. Multiplanar CT image reconstructions and MIPs were obtained to evaluate the vascular anatomy. Carotid stenosis measurements (when applicable) are obtained utilizing NASCET criteria, using the distal internal carotid diameter as the denominator. CONTRAST:  Dose is currently not available COMPARISON:  None FINDINGS: CTA NECK FINDINGS Aortic arch: Atherosclerotic plaque.  Three vessel branching. Right carotid system: Vessels are smooth and widely patent. No atheromatous changes, notable for age. Left carotid system: Vessels are smooth and widely patent. Minimal atheromatous changes at the bulb. Distal left ICA loop. Vertebral arteries: No proximal subclavian atherosclerosis. Symmetric vertebral arteries that are smooth and widely patent to the dura. Skeleton: Bulky ventral endplate spurring in the cervical and upper thoracic spine consistent with diffuse idiopathic skeletal hyperostosis. Other neck: No acute finding. Partial retropharyngeal course of the bilateral carotids. Upper chest: Mild patchy air trapping in the apical lungs. Review of the MIP images confirms the above findings CTA HEAD FINDINGS Anterior circulation: Abrupt left M1 cut off with distal reconstitution but under filled branches. There is atherosclerotic  plaque along both carotid siphons with up to 50% stenosis at the right mid cavernous segment. No right-sided or anterior cerebral embolism is seen. Posterior circulation: Calcified plaque on both carotid siphons with subjective moderate narrowing of the right vertebral artery due to calcified plaque-quantification by measurement limited due to small vessel size and plaque  blooming. Subsequent mild right V4 luminal narrowing from low-density plaque. The basilar is smooth and widely patent. Symmetric robust flow in cerebellar and posterior cerebral branches. Venous sinuses: Patent Anatomic variants: None significant Review of the MIP images confirms the above findings Critical Value/emergent results were called by telephone at the time of interpretation on 03/07/2019 at 7:58 am to provider Samara Snide , who verbally acknowledged these results. IMPRESSION: 1. Emergent large vessel occlusion at the left M1 segment. There is underfilling of downstream reconstituted vessels. 2. Limited atherosclerosis in the cervical carotids with no ulceration. There is a notable left ICA loop at the skull base. 3. Moderate atheromatous narrowing at the right V4 segment. Electronically Signed   By: Monte Fantasia M.D.   On: 03/07/2019 08:07   DG CHEST PORT 1 VIEW  Result Date: 03/07/2019 CLINICAL DATA:  Endotracheal tube placement. EXAM: PORTABLE CHEST 1 VIEW COMPARISON:  March 04, 2019. FINDINGS: Stable cardiomegaly. Endotracheal and nasogastric tubes appear to be in good position. No pneumothorax or pleural effusion is noted. No acute pulmonary disease is noted. Bony thorax is unremarkable. IMPRESSION: Endotracheal and nasogastric tubes in good position. No acute cardiopulmonary abnormality seen. Electronically Signed   By: Marijo Conception M.D.   On: 03/07/2019 11:34   DG Abd Portable 1V  Result Date: 03/07/2019 CLINICAL DATA:  Orogastric tube placement. EXAM: PORTABLE ABDOMEN - 1 VIEW COMPARISON:  December 12, 2014. FINDINGS: The bowel gas pattern is normal. Distal tip of enteric tube is seen in proximal stomach. No radio-opaque calculi or other significant radiographic abnormality are seen. IMPRESSION: Distal tip of enteric tube seen in proximal stomach. No evidence of bowel obstruction or ileus. Electronically Signed   By: Marijo Conception M.D.   On: 03/07/2019 12:44   ECHOCARDIOGRAM  COMPLETE  Result Date: 03/05/2019    ECHOCARDIOGRAM REPORT   Patient Name:   LYNDSY GILBERTO Date of Exam: 03/05/2019 Medical Rec #:  [id number]      Height:       62.0 in Accession #:    [id number]     Weight:       239.0 lb Date of Birth:  May 21, 1945       BSA:          2.06 m Patient Age:    74 years       BP:           122/72 mmHg Patient Gender: F              HR:           64 bpm. Exam Location:  Inpatient Procedure: 2D Echo Indications:    acute diastolic chf 387.56  History:        Patient has prior history of Echocardiogram examinations, most                 recent 05/03/2017. Chronic kidney disease, Signs/Symptoms:elevated                 troponin; Risk Factors:Hypertension, Dyslipidemia, Diabetes and                 Sleep Apnea.  Sonographer:  Johny Chess Referring Phys: [id number]ONALD [place]  1. Left ventricular ejection fraction, by estimation, is 60 to 65%. The left ventricle has normal function. Left ventricular diastolic parameters are consistent with Grade I diastolic dysfunction (impaired relaxation). Normal wall motion. Mild LV hypertrophy.  2. Right ventricular systolic function is moderately reduced. The right ventricular size is mildly enlarged. Tricuspid regurgitation signal is inadequate for assessing RVSP. Would consider PE.  3. The aortic valve is tricuspid. No stenosis or regurgitation.  4. No mitral regurgitation.  5. The IVC was not visualized. FINDINGS  Left Ventricle: Left ventricular ejection fraction, by estimation, is 60 to 65%. The left ventricle has normal function. The left ventricle has no regional wall motion abnormalities. The left ventricular internal cavity size was normal in size. There is  mildly increased left ventricular hypertrophy. Right Ventricle: The right ventricular size is mildly enlarged. No increase in right ventricular wall thickness. Right ventricular systolic function is moderately reduced. Tricuspid regurgitation signal is inadequate  for assessing RVSP. Left Atrium: Left atrial size was normal in size. Right Atrium: Right atrial size was normal in size. Pericardium: Trivial pericardial effusion is present. Mitral Valve: The mitral valve is normal in structure and function. There is mild calcification of the mitral valve leaflet(s). Mild mitral annular calcification. No evidence of mitral valve regurgitation. No evidence of mitral valve stenosis. Tricuspid Valve: The tricuspid valve is normal in structure. Tricuspid valve regurgitation is not demonstrated. Aortic Valve: The aortic valve is tricuspid. Aortic valve regurgitation is not visualized. Mild aortic valve sclerosis is present, with no evidence of aortic valve stenosis. Pulmonic Valve: The pulmonic valve was normal in structure. Pulmonic valve regurgitation is not visualized. Aorta: The aortic root is normal in size and structure. Venous: The inferior vena cava was not well visualized. IAS/Shunts: No atrial level shunt detected by color flow Doppler.  LEFT VENTRICLE PLAX 2D LVIDd:         4.40 cm  Diastology LVIDs:         3.00 cm  LV e' lateral:   7.83 cm/s LV PW:         1.00 cm  LV E/e' lateral: 11.4 LV IVS:        1.00 cm  LV e' medial:    6.42 cm/s LVOT diam:     2.00 cm  LV E/e' medial:  13.9 LV SV:         78.54 ml LV SV Index:   23.51 LVOT Area:     3.14 cm  RIGHT VENTRICLE RV S prime:     9.46 cm/s TAPSE (M-mode): 1.8 cm LEFT ATRIUM             Index       RIGHT ATRIUM           Index LA diam:        4.40 cm 2.13 cm/m  RA Area:     12.40 cm LA Vol (A2C):   60.7 ml 29.44 ml/m RA Volume:   29.40 ml  14.26 ml/m LA Vol (A4C):   47.3 ml 22.94 ml/m LA Biplane Vol: 54.9 ml 26.63 ml/m  AORTIC VALVE LVOT Vmax:   113.00 cm/s LVOT Vmean:  73.600 cm/s LVOT VTI:    0.250 m  AORTA Ao Root diam: 2.60 cm MITRAL VALVE MV Area (PHT): 3.21 cm              SHUNTS MV Decel Time: 236 msec  Systemic VTI:  0.25 m MV E velocity: 89.30 cm/s  103 cm/s  Systemic Diam: 2.00 cm MV A  velocity: 121.00 cm/s 70.3 cm/s MV E/A ratio:  0.74        1.5 Loralie Champagne MD Electronically signed by Loralie Champagne MD Signature Date/Time: 03/05/2019/5:42:33 PM    Final    IR PERCUTANEOUS ART THROMBECTOMY/INFUSION INTRACRANIAL INC DIAG ANGIO  Result Date: 03/08/2019 INDICATION: New onset aphasia with right-sided weakness. Occluded left middle cerebral artery M1 segment proximally on CT angiogram of the head and neck. EXAM: 1. EMERGENT LARGE VESSEL OCCLUSION THROMBOLYSIS (anterior CIRCULATION) COMPARISON:  CT angiogram of the head and neck of March 07, 2019. MEDICATIONS: Ancef 2 g IV antibiotic was administered within 1 hour of the procedure. ANESTHESIA/SEDATION: General anesthesia. CONTRAST:  Isovue 300 approximately 50 mL. FLUOROSCOPY TIME:  Fluoroscopy Time: 20 minutes 6 seconds (1405 mGy). COMPLICATIONS: None immediate. TECHNIQUE: Following a full explanation of the procedure along with the potential associated complications, an informed witnessed consent was obtained from the patient's daughter. The risks of intracranial hemorrhage of 10%, worsening neurological deficit, ventilator dependency, death and inability to revascularize were all reviewed in detail with the patient's daughter. The patient was then put under general anesthesia by the Department of Anesthesiology at [facility]. The right groin was prepped and draped in the usual sterile fashion. Thereafter using modified Seldinger technique, transfemoral access into the right common femoral artery was obtained without difficulty. Over a 0.035 inch guidewire a 8 French Pinnacle sheath was inserted. Through this, and also over a 0.035 inch guidewire a 5 [place] JB 1 catheter was advanced to the aortic arch region and selectively positioned in the left common carotid artery. FINDINGS: The left common carotid arteriogram demonstrates the left external carotid artery and its major branches to be widely patent. The left internal carotid artery  at the bulb and its proximal 1/3 is widely patent. An O shaped configuration of the mid cervical left ICA is noted. More distally, the left internal carotid artery demonstrates wide patency to the cervical petrous junction. Patency of the petrous, cavernous and the supraclinoid segment is noted. There is approximately 50% stenosis at the petrous cavernous junction. The left middle cerebral artery demonstrates an angiographic occlusion in its mid M1 segment. The left anterior cerebral artery opacifies into the capillary and venous phases. PROCEDURE: The diagnostic JB 1 catheter in the left common carotid artery was exchanged over a 0.035 inch 300 cm Rosen exchange guidewire for an 8 [place] Pinnacle sheath in the right groin which was then connected to continuous heparinized saline infusion. Over the [facility] exchange guidewire, an 087 95 cm balloon guide catheter which had been prepped with 50% contrast and 50% heparinized saline infusion was advanced and positioned in the proximal 1/3 of the left internal carotid artery. The guidewire was removed. Good aspiration obtained from the hub of balloon guide catheter. A control arteriogram performed through this demonstrated no evidence of spasms, dissections or intraluminal filling defects. No change was noted in the intracranial circulation. Over a 0.014 inch standard Synchro micro guidewire with a J configuration, the combination of an 021 Trevo ProVue microcatheter and a 6 French 132 cm Catalyst guide catheter was advanced without difficulty to the supraclinoid left ICA. The micro guidewire was then gently manipulated with a torque device and advanced through the occluded left middle cerebral artery into the M2 M3 region of the dominant inferior division followed by the microcatheter. The guidewire was removed. Good aspiration obtained  from the hub of the microcatheter. A gentle control arteriogram performed through the microcatheter demonstrated safe position of the tip  of the microcatheter. This was then connected to continuous heparinized saline infusion. The 6 [place] Catalyst guide catheter was now advanced into the supraclinoid left ICA. A 4 mm x 40 mm Solitaire X retrieval device was then advanced to the distal end of the microcatheter and deployed by slight forward gentle traction with the right hand on the delivery micro guidewire with the left hand retrieving the delivery microcatheter deploying the retrieval device. The Catalyst guide catheter was advanced into left middle cerebral artery and imbedded in the proximal portion of the occlusion. Proximal flow arrest was then initiated by inflating the balloon in the left internal carotid artery. With constant aspiration being applied at the hub of the 6 [place] Catalyst guide catheter with a Penumbra aspiration device over 2 minutes, and with a 60 mL syringe at the hub of the balloon guide catheter, the combination of the retrieval device, the microcatheter and the 6 [place] Catalyst guide catheter were retrieved and removed. Proximal flow arrest was then reversed. A control arteriogram performed through the 087 balloon guide catheter in the left internal carotid artery demonstrated complete angiographic revascularization of the left middle and the left anterior cerebral arteries. A TICI 3 revascularization of the left MCA distribution was achieved. During the procedure, the patient's blood pressure and neurological status remained stable. No evidence of extravasation or mass-effect or midline shift was noted. The balloon guide catheter was retrieved and removed. The 8 French Pinnacle sheath in the right groin was then replaced with an 8 French Angio-Seal closure device for hemostasis. Distal pulses in the dorsalis pedis, and the posterior tibial regions remained Dopplerable bilaterally unchanged from prior to the procedure. A flat panel CT of the brain demonstrated no evidence of spasms, dissections or of intraluminal filling  defects. The patient was left intubated on account of her body habitus to maintain her airway. She was then transferred to the neuro ICU to continue with post thrombectomy management. IMPRESSION: Status post endovascular complete revascularization of the occluded left middle cerebral artery with 1 pass with the 4 mm x 40 mm Solitaire X retrieval device, and Penumbra aspiration achieving a TICI 3 revascularization. PLAN: Follow up in the clinic 4 weeks post discharge. Electronically Signed   By: Luanne Bras M.D.   On: 03/07/2019 16:38   CT HEAD CODE STROKE WO CONTRAST  Result Date: 03/07/2019 CLINICAL DATA:  Code stroke. Recent fall. Code stroke for new onset right hemiplegia and aphasia. EXAM: CT HEAD WITHOUT CONTRAST TECHNIQUE: Contiguous axial images were obtained from the base of the skull through the vertex without intravenous contrast. COMPARISON:  11/07/2016 FINDINGS: Brain: Acute subdural hemorrhage along the right cerebral convexity measuring up to 1 cm in thickness. Mild local mass effect with no midline shift. No acute infarct is seen on the left. Vascular: No hyperdense vessel or unexpected calcification. Skull: Normal. Negative for fracture or focal lesion. Sinuses/Orbits: Negative Other: These results were called by telephone at the time of interpretation on 03/07/2019 at 7:58 am to provider [facility] , who verbally acknowledged these results. ASPECTS [facility] Stroke Program Early CT Score) - Ganglionic level infarction (caudate, lentiform nuclei, internal capsule, insula, M1-M3 cortex): 7 - Supraganglionic infarction (M4-M6 cortex): 3 Total score (0-10 with 10 being normal): 10 IMPRESSION: 1. Acute subdural hematoma along the right convexity measuring up to 1 cm in thickness. 2. Left MCA syndrome.  Left  hemispheric ASPECTS is 10. Electronically Signed   By: Monte Fantasia M.D.   On: 03/07/2019 08:00   VAS [place] LOWER EXTREMITY VENOUS (DVT)  Result Date: 03/06/2019  Lower Venous DVTStudy  Indications: Edema.  Limitations: Body habitus, poor ultrasound/tissue interface and pain tolerance. Comparison Study: no prior Performing Technologist: Abram Sander RVS  Examination Guidelines: A complete evaluation includes B-mode imaging, spectral Doppler, color Doppler, and power Doppler as needed of all accessible portions of each vessel. Bilateral testing is considered an integral part of a complete examination. Limited examinations for reoccurring indications may be performed as noted. The reflux portion of the exam is performed with the patient in reverse Trendelenburg.  +---------+---------------+---------+-----------+----------+------------------+ RIGHT    CompressibilityPhasicitySpontaneityPropertiesThrombus Aging     +---------+---------------+---------+-----------+----------+------------------+ CFV      Full           Yes      Yes                                     +---------+---------------+---------+-----------+----------+------------------+ SFJ      Full                                                            +---------+---------------+---------+-----------+----------+------------------+ FV Prox  Full                                                            +---------+---------------+---------+-----------+----------+------------------+ FV Mid   Full                                                            +---------+---------------+---------+-----------+----------+------------------+ FV Distal               Yes      Yes                                     +---------+---------------+---------+-----------+----------+------------------+ PFV      Full                                                            +---------+---------------+---------+-----------+----------+------------------+ POP      Full           Yes      Yes                                     +---------+---------------+---------+-----------+----------+------------------+  PTV      Full                                                            +---------+---------------+---------+-----------+----------+------------------+  PERO     Full                                         limited                                                                  visualization      +---------+---------------+---------+-----------+----------+------------------+   +---------+---------------+---------+-----------+----------+--------------+ LEFT     CompressibilityPhasicitySpontaneityPropertiesThrombus Aging +---------+---------------+---------+-----------+----------+--------------+ CFV      Full           Yes      Yes                                 +---------+---------------+---------+-----------+----------+--------------+ SFJ      Full                                                        +---------+---------------+---------+-----------+----------+--------------+ FV Prox  Full                                                        +---------+---------------+---------+-----------+----------+--------------+ FV Mid   Full                                                        +---------+---------------+---------+-----------+----------+--------------+ FV Distal               Yes      Yes                                 +---------+---------------+---------+-----------+----------+--------------+ PFV      Full                                                        +---------+---------------+---------+-----------+----------+--------------+ POP      Full           Yes      Yes                                 +---------+---------------+---------+-----------+----------+--------------+ PTV      Full                                                        +---------+---------------+---------+-----------+----------+--------------+  Summary: BILATERAL: - No evidence of deep vein thrombosis seen in the lower extremities,  bilaterally.   *See table(s) above for measurements and observations. Electronically signed by Deitra Mayo MD on 03/06/2019 at 75:14:12 PM.    Final    ECHOCARDIOGRAM LIMITED  Result Date: 03/07/2019    ECHOCARDIOGRAM LIMITED REPORT   Patient Name:   LAGUANA DESAUTEL Date of Exam: 03/07/2019 Medical Rec #:  [id number]      Height:       62.0 in Accession #:    [id number]     Weight:       242.5 lb Date of Birth:  1945-10-17       BSA:          2.07 m Patient Age:    59 years       BP:           148/67 mmHg Patient Gender: F              HR:           65 bpm. Exam Location:  Inpatient Procedure: Limited Echo and Intracardiac Opacification Agent Indications:    Stroke 434.91 / I163.9  History:        Patient has prior history of Echocardiogram examinations, most                 recent 03/05/2019. CHF; Risk Factors:Hypertension, Dyslipidemia                 and Diabetes. Elevated troponin, chronic kidney disease, chronic                 anemia.  Sonographer:    Darlina Sicilian RDCS Referring Phys: [id number] Lanice Schwab AROOR  Sonographer Comments: Per Dr. Lorraine Lax look for LV thrombus IMPRESSIONS  1. Left ventricular ejection fraction, by estimation, is 65 to 70%. The left ventricle has normal function. The left ventrical has no regional wall motion abnormalities. Left ventricular diastolic function could not be evaluated. LV filling pressures not assessed. No evidence of LV apical thrombus by definity contrast.  2. Right ventricular systolic function was not well visualized. The right ventricular size is not well visualized.  3. The mitral valve is normal in structure and function.  4. The aortic valve was not assessed. FINDINGS  Left Ventricle: Left ventricular ejection fraction, by estimation, is 65 to 70%. The left ventricle has normal function. The left ventricle has no regional wall motion abnormalities. The left ventricular internal cavity size was normal in size. There is  no left ventricular hypertrophy. LV  filling pressures not assessed. o evidence of LV apical thrombus by definity contrast Right Ventricle: The right ventricular size is not well visualized. Right vetricular wall thickness was not assessed. Right ventricular systolic function was not well visualized. Left Atrium: Left atrial size was not assessed. Right Atrium: Right atrial size was not assessed. Pericardium: Trivial pericardial effusion is present. The pericardial effusion is posterior to the left ventricle. Mitral Valve: The mitral valve is normal in structure and function. Normal mobility of the mitral valve leaflets. Tricuspid Valve: The tricuspid valve is not assessed. Aortic Valve: The aortic valve was not assessed. Pulmonic Valve: The pulmonic valve was not assessed. Aorta: Aortic root could not be assessed. Venous: The inferior vena cava was not well visualized. The inferior vena cava and the hepatic vein show a normal flow pattern. IAS/Shunts: No atrial level shunt detected by color flow Doppler. Fransico Him MD Electronically signed by Fransico Him  MD Signature Date/Time: 03/07/2019/11:26:44 AM    Final     Labs:  CBC: Recent Labs    03/04/19 0954 03/04/19 1012 03/04/19 1557 03/04/19 1557 03/06/19 0853 03/07/19 1228 03/07/19 1602 03/08/19 0545  WBC 6.5  --  6.0  --  4.6  --   --  7.4  HGB 9.7*   < > 9.6*   < > 9.3* 9.2* 8.8* 8.4*  HCT 29.4*   < > 27.5*   < > 28.0* 27.0* 26.0* 25.5*  PLT 185  --  182  --  205  --   --  198   < > = values in this interval not displayed.    COAGS: No results for input(s): INR, APTT in the last 8760 hours.  BMP: Recent Labs    03/05/19 0405 03/05/19 0405 03/06/19 0846 03/06/19 0846 03/07/19 0542 03/07/19 1228 03/07/19 1602 03/08/19 0545  NA 139   < > 136   < > 136 136 135 137  K 3.8   < > 4.5   < > 4.5 4.2 4.2 4.2  CL 106  --  101  --  104  --   --  105  CO2 21*  --  25  --  23  --   --  20*  GLUCOSE 91  --  192*  --  125*  --   --  162*  BUN 58*  --  56*  --  52*  --   --   42*  CALCIUM 8.3*  --  8.3*  --  8.4*  --   --  8.2*  CREATININE 3.53*  --  3.34*  --  3.17*  --   --  2.51*  GFRNONAA 12*  --  13*  --  14*  --   --  19*  GFRAA 14*  --  15*  --  16*  --   --  22*   < > = values in this interval not displayed.    LIVER FUNCTION TESTS: Recent Labs    03/02/19 0855  BILITOT 1.0  AST 17  ALT 13  ALKPHOS 101  PROT 7.4  ALBUMIN 4.0    Assessment and Plan: New onset aphasia and right-sided weakness, right temporal/parietal occipital subacute subdural hemorrhage L MCA occlusion s/p complete revascularization of occluded L MCA occlusion with retriever device with penumbra aspiration achieving a TICI3 revascularization.  Patient assessed at bedside. She remains intubated this AM, but is weaning toward extubation.  She is moving all extremities, including the right side.  She follows commands.  Her groin is soft. No evidence of hematoma or pseudoaneurysm. DP dopperable.  Further plans per Neuro.   Electronically Signed: Docia Barrier, PA 03/08/2019, 12:54 PM   I spent a total of 15 Minutes at the the patient's bedside AND on the patient's hospital floor or unit, greater than 50% of which was counseling/coordinating care for acute L MCA occlusion, CVA.

## 2019-03-08 NOTE — Progress Notes (Signed)
NAME:  [name], MRN:  [id number], DOB:  1945-10-26, LOS: 3 ADMISSION DATE:  03/04/2019, CONSULTATION DATE:  2/10 REFERRING MD:  Dr Lorraine Lax, CHIEF COMPLAINT:  CVA   Brief History   74 year old female admitted 2/7 for AoC HFpEF and was undergoing diuretic therapy when she suffered a CVA in the early hours of 2/10. She was not given tPA due to subdural hemorrhage on CT, but was taken to IR for intervention. Post-op she remained on vent.   Past Medical History   has a past medical history of 1st degree AV block (05/02/2017), Acute encephalopathy (09/25/2012), Acute hyperkalemia (05/02/2017), Acute renal failure ([place]) (12/11/2014), Acute respiratory failure with hypoxia ([place]) (05/02/2017), Anemia, Arterial embolism and thrombosis of upper extremity ([facility]), Chronic diastolic (congestive) heart failure ([place]), CKD (chronic kidney disease) stage 4, GFR 15-29 ml/min ([place]) (05/02/2017), Depression, Diabetes mellitus type 2, insulin dependent ([place]) (05/02/2017), DVT (deep venous thrombosis) ([place]), Dyslipidemia (05/02/2017), Essential hypertension, GERD (gastroesophageal reflux disease), HTN (hypertension) (05/02/2017), Hyperlipidemia, Junctional bradycardia, Morbid obesity with BMI of 45.0-49.9, adult ([place]) (05/02/2017), OSA (obstructive sleep apnea) (10/17/2012), Rhabdomyolysis (09/25/2012), Seizures ([place]), Symptomatic bradycardia (11/27/2016), and Type II diabetes mellitus ([place]).   Significant Hospital Events   2/7 admit 2/10 CVA, to IR, to ICU intubated post op  Consults:  Cardiology Neurology IR  Procedures:  2/10 IR revascularization of Lt MCA 2/10 ETT > 2/11  Significant Diagnostic Tests:  CT head 2/10 > Acute subdural hematoma along the right convexity measuring up to 1 cm in thickness. Left MCA syndrome. CTA/P head/neck 2/10 > Emergent large vessel occlusion at the left M1 segment. Limited atherosclerosis in the cervical carotids with no ulceration. There is a notable left ICA loop at the skull base. MRI brain  2/11 >  Micro Data:    Antimicrobials:     Interim history/subjective:  Weaning well on 5/5 this morning  Objective   Blood pressure (!) 139/59, pulse 83, temperature (!) 100.5 F (38.1 C), temperature source Axillary, resp. rate 20, height 5\' 2"  (1.575 m), weight 110 kg, SpO2 99 %.    Vent Mode: PSV;CPAP FiO2 (%):  [30 %-40 %] 30 % Set Rate:  [15 bmp-20 bmp] 20 bmp Vt Set:  [400 mL] 400 mL PEEP:  [5 cmH20] 5 cmH20 Pressure Support:  [10 cmH20] 10 cmH20 Plateau Pressure:  [12 cmH20-21 cmH20] 21 cmH20   Intake/Output Summary (Last 24 hours) at 03/08/2019 0942 Last data filed at 03/08/2019 0800 Gross per 24 hour  Intake 2646.53 ml  Output 950 ml  Net 1696.53 ml   Filed Weights   03/05/19 0522 03/06/19 0326 03/07/19 0446  Weight: 108.4 kg 109.5 kg 110 kg    Examination: General: morbidly obese female HENT: [place]/AT, PERRL, no JVD Lungs: distant breath sounds. Unlabored Cardiovascular: RRR, no MRG Abdomen: Soft, non-distended, non-tender Extremities: No acute deformity. Good strength bilaterally.  Neuro: Alert, oriented, non-focal.   Resolved Hospital Problem list     Assessment & Plan:   Acute hypoxemic respiratory failure: secondary to CVA and IR sedation related encephalopathy - Extubate - Supplemental O2 as needed to keep O2 sat > 90%  Left MCA CVA: s/p IR procedure 2/10 Subdural hematoma - Neurology/IR following - MRI pending  Acute on chronic HFpEF Elevated troponin: has been marginally trending down - Cardiology following - Continue home dose lasix via tube  Hypertension - Cleviprex infusion per IR goals, generally 120-140 mmHg - Continue amlodipine, hydralazine, furosemide - cardiology following  Acute kindey injury: serum creatinine trending  down CKD IV  - follow BMP with diuresis  OSA: does not wear CPAP - close monitoring after extubation - Suspect she would benefit from sleep study  DM2 - CBG monitoring and SSI  Best practice:  Diet:  SLP eval pending Pain/Anxiety/Delirium protocol (if indicated): na VAP protocol (if indicated): na DVT prophylaxis: SQH GI prophylaxis: PPI Glucose control: SSI Mobility: BR Code Status: FULL Family Communication: per primary Disposition: ICU  Labs   CBC: Recent Labs  Lab 03/02/19 0855 03/02/19 0855 03/04/19 0954 03/04/19 1012 03/04/19 1557 03/06/19 0853 03/07/19 1228 03/07/19 1602 03/08/19 0545  WBC 8.5  --  6.5  --  6.0 4.6  --   --  7.4  NEUTROABS 7.2  --  5.3  --   --   --   --   --  6.3  HGB 10.4*   < > 9.7*   < > 9.6* 9.3* 9.2* 8.8* 8.4*  HCT 30.9*   < > 29.4*   < > 27.5* 28.0* 27.0* 26.0* 25.5*  MCV 94.2  --  94.2  --  90.5 94.6  --   --  95.1  PLT 213  --  185  --  182 205  --   --  198   < > = values in this interval not displayed.    Basic Metabolic Panel: Recent Labs  Lab 03/04/19 0954 03/04/19 1012 03/04/19 1557 03/05/19 0405 03/05/19 0405 03/06/19 0846 03/07/19 0542 03/07/19 1228 03/07/19 1602 03/08/19 0545  NA 137   < >  --  139   < > 136 136 136 135 137  K 4.0   < >  --  3.8   < > 4.5 4.5 4.2 4.2 4.2  CL 103  --   --  106  --  101 104  --   --  105  CO2 23  --   --  21*  --  25 23  --   --  20*  GLUCOSE 273*  --   --  91  --  192* 125*  --   --  162*  BUN 58*  --   --  58*  --  56* 52*  --   --  42*  CREATININE 3.65*   < > 3.45* 3.53*  --  3.34* 3.17*  --   --  2.51*  CALCIUM 8.4*  --   --  8.3*  --  8.3* 8.4*  --   --  8.2*   < > = values in this interval not displayed.   GFR: Estimated Creatinine Clearance: 24 mL/min (A) (by C-G formula based on SCr of 2.51 mg/dL (H)). Recent Labs  Lab 03/02/19 0854 03/02/19 0855 03/04/19 0954 03/04/19 1557 03/06/19 0853 03/08/19 0545  WBC  --    < > 6.5 6.0 4.6 7.4  LATICACIDVEN 1.5  --   --   --   --   --    < > = values in this interval not displayed.    Liver Function Tests: Recent Labs  Lab 03/02/19 0855  AST 17  ALT 13  ALKPHOS 101  BILITOT 1.0  PROT 7.4  ALBUMIN 4.0   No results  for input(s): LIPASE, AMYLASE in the last 168 hours. No results for input(s): AMMONIA in the last 168 hours.  ABG    Component Value Date/Time   PHART 7.340 (L) 03/07/2019 1602   PCO2ART 36.1 03/07/2019 1602   PO2ART 96.0 03/07/2019 1602   HCO3 19.4 (  L) 03/07/2019 1602   TCO2 21 (L) 03/07/2019 1602   ACIDBASEDEF 6.0 (H) 03/07/2019 1602   O2SAT 97.0 03/07/2019 1602     Coagulation Profile: No results for input(s): INR, PROTIME in the last 168 hours.  Cardiac Enzymes: No results for input(s): CKTOTAL, CKMB, CKMBINDEX, TROPONINI in the last 168 hours.  HbA1C: Hgb A1c MFr Bld  Date/Time Value Ref Range Status  03/08/2019 05:46 AM 5.6 4.8 - 5.6 % Final    Comment:    (NOTE) Pre diabetes:          5.7%-6.4% Diabetes:              >6.4% Glycemic control for   <7.0% adults with diabetes   03/05/2019 04:05 AM 5.6 4.8 - 5.6 % Final    Comment:    (NOTE)         Prediabetes: 5.7 - 6.4         Diabetes: >6.4         Glycemic control for adults with diabetes: <7.0     CBG: Recent Labs  Lab 03/07/19 0614 03/07/19 1225 03/07/19 1548 03/07/19 1957 03/07/19 2154  GLUCAP 112* 143* 182* 152* 131*   Critical care time: 35 mins     Georgann Housekeeper, AGACNP-BC [place] for personal pager PCCM on call pager [phone]  03/08/2019 9:42 AM

## 2019-03-09 ENCOUNTER — Encounter (HOSPITAL_COMMUNITY): Payer: Self-pay | Admitting: Neurology

## 2019-03-09 DIAGNOSIS — E119 Type 2 diabetes mellitus without complications: Secondary | ICD-10-CM

## 2019-03-09 DIAGNOSIS — E875 Hyperkalemia: Secondary | ICD-10-CM

## 2019-03-09 DIAGNOSIS — G4733 Obstructive sleep apnea (adult) (pediatric): Secondary | ICD-10-CM

## 2019-03-09 DIAGNOSIS — I498 Other specified cardiac arrhythmias: Secondary | ICD-10-CM

## 2019-03-09 DIAGNOSIS — R569 Unspecified convulsions: Secondary | ICD-10-CM

## 2019-03-09 DIAGNOSIS — Z6841 Body Mass Index (BMI) 40.0 and over, adult: Secondary | ICD-10-CM

## 2019-03-09 DIAGNOSIS — E782 Mixed hyperlipidemia: Secondary | ICD-10-CM

## 2019-03-09 LAB — URINALYSIS, ROUTINE W REFLEX MICROSCOPIC
Glucose, UA: NEGATIVE mg/dL
Hgb urine dipstick: NEGATIVE
Ketones, ur: 15 mg/dL — AB
Nitrite: NEGATIVE
Protein, ur: NEGATIVE mg/dL
Specific Gravity, Urine: 1.02 (ref 1.005–1.030)
pH: 5 (ref 5.0–8.0)

## 2019-03-09 LAB — URINALYSIS, MICROSCOPIC (REFLEX)

## 2019-03-09 LAB — GLUCOSE, CAPILLARY
Glucose-Capillary: 132 mg/dL — ABNORMAL HIGH (ref 70–99)
Glucose-Capillary: 171 mg/dL — ABNORMAL HIGH (ref 70–99)
Glucose-Capillary: 210 mg/dL — ABNORMAL HIGH (ref 70–99)
Glucose-Capillary: 198 mg/dL — ABNORMAL HIGH (ref 70–99)

## 2019-03-09 LAB — RENAL FUNCTION PANEL
Albumin: 2.8 g/dL — ABNORMAL LOW (ref 3.5–5.0)
Anion gap: 14 (ref 5–15)
BUN: 51 mg/dL — ABNORMAL HIGH (ref 8–23)
CO2: 14 mmol/L — ABNORMAL LOW (ref 22–32)
Calcium: 8.5 mg/dL — ABNORMAL LOW (ref 8.9–10.3)
Chloride: 111 mmol/L (ref 98–111)
Creatinine, Ser: 3.69 mg/dL — ABNORMAL HIGH (ref 0.44–1.00)
GFR calc Af Amer: 14 mL/min — ABNORMAL LOW (ref >60)
GFR calc non Af Amer: 12 mL/min — ABNORMAL LOW (ref >60)
Glucose, Bld: 212 mg/dL — ABNORMAL HIGH (ref 70–99)
Phosphorus: 5.5 mg/dL — ABNORMAL HIGH (ref 2.5–4.6)
Potassium: 4.8 mmol/L (ref 3.5–5.1)
Sodium: 139 mmol/L (ref 135–145)

## 2019-03-09 LAB — COMPREHENSIVE METABOLIC PANEL
ALT: 14 U/L (ref 0–44)
AST: 21 U/L (ref 15–41)
Albumin: 2.7 g/dL — ABNORMAL LOW (ref 3.5–5.0)
Alkaline Phosphatase: 81 U/L (ref 38–126)
Anion gap: 14 (ref 5–15)
BUN: 50 mg/dL — ABNORMAL HIGH (ref 8–23)
CO2: 19 mmol/L — ABNORMAL LOW (ref 22–32)
Calcium: 8.2 mg/dL — ABNORMAL LOW (ref 8.9–10.3)
Chloride: 106 mmol/L (ref 98–111)
Creatinine, Ser: 3.9 mg/dL — ABNORMAL HIGH (ref 0.44–1.00)
GFR calc Af Amer: 13 mL/min — ABNORMAL LOW (ref >60)
GFR calc non Af Amer: 11 mL/min — ABNORMAL LOW (ref >60)
Glucose, Bld: 209 mg/dL — ABNORMAL HIGH (ref 70–99)
Potassium: 4.2 mmol/L (ref 3.5–5.1)
Sodium: 139 mmol/L (ref 135–145)
Total Bilirubin: 0.6 mg/dL (ref 0.3–1.2)
Total Protein: 6 g/dL — ABNORMAL LOW (ref 6.5–8.1)

## 2019-03-09 LAB — BASIC METABOLIC PANEL
Anion gap: 14 (ref 5–15)
BUN: 50 mg/dL — ABNORMAL HIGH (ref 8–23)
CO2: 12 mmol/L — ABNORMAL LOW (ref 22–32)
Calcium: 8.2 mg/dL — ABNORMAL LOW (ref 8.9–10.3)
Chloride: 111 mmol/L (ref 98–111)
Creatinine, Ser: 3.27 mg/dL — ABNORMAL HIGH (ref 0.44–1.00)
GFR calc Af Amer: 16 mL/min — ABNORMAL LOW (ref >60)
GFR calc non Af Amer: 14 mL/min — ABNORMAL LOW (ref >60)
Glucose, Bld: 121 mg/dL — ABNORMAL HIGH (ref 70–99)
Potassium: 6 mmol/L — ABNORMAL HIGH (ref 3.5–5.1)
Sodium: 137 mmol/L (ref 135–145)

## 2019-03-09 LAB — NA AND K (SODIUM & POTASSIUM), RAND UR
Potassium Urine: 31 mmol/L
Sodium, Ur: <10 mmol/L

## 2019-03-09 LAB — CHLORIDE, URINE, RANDOM: Chloride Urine: <15 mmol/L

## 2019-03-09 MED ORDER — SODIUM POLYSTYRENE SULFONATE 15 GM/60ML PO SUSP
60.0000 g | Freq: Once | ORAL | Status: DC
  Filled 2019-03-09: qty 240

## 2019-03-09 MED ORDER — CALCIUM GLUCONATE-NACL 1-0.675 GM/50ML-% IV SOLN
1.0000 g | Freq: Once | INTRAVENOUS | Status: AC
  Administered 2019-03-09: 13:00:00 1000 mg via INTRAVENOUS
  Filled 2019-03-09: qty 50

## 2019-03-09 MED ORDER — SODIUM ZIRCONIUM CYCLOSILICATE 5 G PO PACK: 5.0000 g | PACK | Freq: Once | ORAL | Status: DC

## 2019-03-09 MED ORDER — FUROSEMIDE 40 MG PO TABS
40.0000 mg | ORAL_TABLET | Freq: Every day | ORAL | Status: DC
  Administered 2019-03-10 – 2019-03-14 (×5): 40 mg via ORAL
  Filled 2019-03-09 (×5): qty 1

## 2019-03-09 MED ORDER — SODIUM POLYSTYRENE SULFONATE 15 GM/60ML PO SUSP
30.0000 g | Freq: Once | ORAL | Status: AC
  Administered 2019-03-09: 30 g via ORAL
  Filled 2019-03-09: qty 120

## 2019-03-09 MED ORDER — INSULIN ASPART 100 UNIT/ML IV SOLN
10.0000 [IU] | Freq: Once | INTRAVENOUS | Status: AC
  Administered 2019-03-09: 11:00:00 10 [IU] via INTRAVENOUS

## 2019-03-09 MED ORDER — SODIUM BICARBONATE 650 MG PO TABS
1300.0000 mg | ORAL_TABLET | Freq: Three times a day (TID) | ORAL | Status: DC
  Administered 2019-03-09 – 2019-03-14 (×15): 1300 mg via ORAL
  Filled 2019-03-09 (×16): qty 2

## 2019-03-09 MED ORDER — DEXTROSE 50 % IV SOLN
25.0000 g | Freq: Once | INTRAVENOUS | Status: AC
  Administered 2019-03-09: 11:00:00 25 g via INTRAVENOUS
  Filled 2019-03-09: qty 50

## 2019-03-09 NOTE — Consult Note (Signed)
Reason for Consult: Chronic kidney disease, metabolic acidosis, hyperkalemia Referring Physician: Karmen Bongo, MD [facility])  HPI:  74 year old African-American woman with past medical history significant for hypertension, diastolic heart failure, dyslipidemia, type 2 diabetes mellitus, obesity, dyslipidemia, bradycardia with first-degree AV block and baseline chronic kidney disease stage IV with baseline creatinine 3.0-3.4 (last saw Dr. Florene Glen of nephrology back in 2019).  She was admitted to the hospital 1 week ago with acute exacerbation of chronic diastolic heart failure and started on oral diuretics after transient intravenous diuretics to which she appeared to have responded clinically well.  Unfortunately, 2 days ago she developed new onset aphasia with right-sided weakness and CT scan of the brain showed a right subdural hemorrhage with CT angiogram showing M1 occlusion following which the patient underwent successful mechanical thrombectomy.  CT angiogram contrast dose not documented but received approximately 50 cc iodinated contrast for mechanical thrombectomy.  She was transiently intubated after the procedure and concern is raised with her worsening metabolic acidosis and hyperkalemia with high risk for contrast nephropathy.  When seen, she informs me that she does not have any chest pain or shortness of breath and is comfortably eating her lunch.  She denies any nausea, vomiting or diarrhea and does not have any lightheadedness.  She appears to have had some transient hypotension earlier with blood pressure as low as 86/59 today and 78/68 yesterday.  She denies any urinary complaints preceding hospitalization.  Past Medical History:  Diagnosis Date  . 1st degree AV block 05/02/2017  . Anemia   . Arterial embolism and thrombosis of upper extremity ([place])   . Chronic diastolic (congestive) heart failure ([place])    a. 04/2017 Echo: EF 60-65%. Gr1 DD. Mod LVH.   . CKD (chronic kidney disease) stage  4, GFR 15-29 ml/min ([facility]) 05/02/2017  . Depression   . Diabetes mellitus type 2, insulin dependent ([place]) 05/02/2017  . DVT (deep venous thrombosis) ([place])   . Dyslipidemia 05/02/2017  . Essential hypertension   . GERD (gastroesophageal reflux disease)   . Hyperlipidemia   . Junctional bradycardia   . Morbid obesity with BMI of 45.0-49.9, adult ([place]) 05/02/2017  . OSA (obstructive sleep apnea) 10/17/2012  . Rhabdomyolysis 09/25/2012  . Seizures ([place])    "epilepsy; they think I had one a couple months ago" (12/11/2014)  . Symptomatic bradycardia 11/27/2016    Past Surgical History:  Procedure Laterality Date  . IR CT HEAD LTD  03/07/2019  . IR PERCUTANEOUS ART THROMBECTOMY/INFUSION INTRACRANIAL INC DIAG ANGIO  03/07/2019  . RADIOLOGY WITH ANESTHESIA N/A 03/07/2019   Procedure: IR WITH ANESTHESIA;  Surgeon: Radiologist, Medication, MD;  Location: [place];  Service: Radiology;  Laterality: N/A;  . THROMBECTOMY BRACHIAL ARTERY Left 12/15/2014   Procedure: THROMBECTOMY BRACHIAL ARTERY; VEIN PATCH ANGIOPLASTY LEFT BRACIAL ARTERY;  Surgeon: Angelia Mould, MD;  Location: [place];  Service: Vascular;  Laterality: Left;  . TUBAL LIGATION      Family History  Problem Relation Age of Onset  . Hypertension Mother        died @ 55 - unknown cause.  [name] Hypertension Father        says she doesn't know anything about her father.  . Cancer Maternal Aunt   . Seizures Neg Hx     Social History:  reports that she has never smoked. She has never used smokeless tobacco. She reports that she does not drink alcohol or use drugs.  Allergies:  Allergies  Allergen Reactions  . Codeine Nausea  And Vomiting  . Metoprolol Other (See Comments)    Severe bradycardia.  Now getting admitted for 3rd time in 3 years for bradycardia onset after being started on toprol.  Please dont put patient on this med anymore!    Medications:  Scheduled: .  stroke: mapping our early stages of recovery book   Does not apply Once  .  aspirin EC  81 mg Oral Daily  . carbamazepine  400 mg Oral BID  . Chlorhexidine Gluconate Cloth  6 each Topical Daily  . citalopram  20 mg Oral Daily  . ezetimibe  10 mg Oral Daily   And  . simvastatin  40 mg Per Tube Daily  . [START ON 03/10/2019] furosemide  40 mg Oral Daily  . insulin aspart  0-5 Units Subcutaneous QHS  . insulin aspart  0-6 Units Subcutaneous TID WC  . mirtazapine  15 mg Oral QHS  . pantoprazole sodium  40 mg Oral Daily  . sodium bicarbonate  1,300 mg Oral TID  . sodium chloride flush  3 mL Intravenous Q12H    BMP Latest Ref Rng & Units 03/09/2019 03/08/2019 03/07/2019  Glucose 70 - 99 mg/dL 121(H) 162(H) -  BUN 8 - 23 mg/dL 50(H) 42(H) -  Creatinine 0.44 - 1.00 mg/dL 3.27(H) 2.51(H) -  Sodium 135 - 145 mmol/L 137 137 135  Potassium 3.5 - 5.1 mmol/L 6.0(H) 4.2 4.2  Chloride 98 - 111 mmol/L 111 105 -  CO2 22 - 32 mmol/L 12(L) 20(L) -  Calcium 8.9 - 10.3 mg/dL 8.2(L) 8.2(L) -   CBC Latest Ref Rng & Units 03/08/2019 03/07/2019 03/07/2019  WBC 4.0 - 10.5 K/uL 7.4 - -  Hemoglobin 12.0 - 15.0 g/dL 8.4(L) 8.8(L) 9.2(L)  Hematocrit 36.0 - 46.0 % 25.5(L) 26.0(L) 27.0(L)  Platelets 150 - 400 K/uL 198 - -     MR ANGIO HEAD WO CONTRAST  Addendum Date: 03/08/2019   ADDENDUM REPORT: 03/08/2019 16:05 ADDENDUM: Please note that several sequences from a different patient's examination were initially included in this patient's brain MRI in PACS. This error has reportedly been resolved. The interpretation is largely unchanged. However, upon further review there are additional punctate acute infarcts noted within the left caudate nucleus (series 2, image 27) and left lentiform nucleus (series 2, image 26). Addendum discussed with Dr. Erlinda Hong by telephone at 4 p.m. on 03/08/2019. Electronically Signed   By: Kellie Simmering DO   On: 03/08/2019 16:05   Result Date: 03/08/2019 CLINICAL DATA:  Stroke, follow-up. EXAM: MRI HEAD WITHOUT CONTRAST MRA HEAD WITHOUT CONTRAST TECHNIQUE: Multiplanar,  multiecho pulse sequences of the brain and surrounding structures were obtained without intravenous contrast. Angiographic images of the head were obtained using MRA technique without contrast. COMPARISON:  Noncontrast CT head and CT angiogram head/neck 03/07/2019, report from catheter based angiography performed 03/07/2019 FINDINGS: MRI HEAD FINDINGS Brain: The diffusion-weighted imaging is of good quality. The remainder of the examination is significantly motion degraded. Most notably there is moderate moderate/severe motion degradation of the sagittal T1 FLAIR sequence, moderate/severe motion degradation of the axial T2 weighted sequence, severe motion degradation of the axial T2 FLAIR sequence, severe motion degradation of the coronal T2 weighted sequence, severe motion degradation of the axial GRE sequence and moderate motion degradation of the axial T1 weighted sequence. There are only two subcentimeter cortically based acute infarcts within the left parietal lobe. No other acute infarct is identified. Within the limitations of motion degradation, an acute right cerebral convexity subdural hematoma is unchanged  in size, measuring 10 mm in thickness. Only minimal mass effect upon the underlying right cerebral hemisphere. No midline shift. Previously demonstrated mild chronic small vessel ischemic disease within the cerebral hemispheres and pons is suboptimally assessed. Mild generalized parenchymal atrophy. Vascular: Reported separately. Skull and upper cervical spine: Within limitations of motion degradation, no focal marrow lesion is identified. Incompletely assessed upper cervical spondylosis. Sinuses/Orbits: Visualized orbits demonstrate no acute abnormality. Mild scattered paranasal sinus mucosal thickening. No significant mastoid effusion. MRA HEAD FINDINGS The examination is moderately motion degraded, limiting evaluation. The intracranial internal carotid arteries are patent bilaterally. Redemonstrated  moderate stenosis within the bilateral cavernous segments. The M1 left middle cerebral artery is now patent without significant stenosis. No left M2 proximal branch occlusion or high-grade proximal stenosis is identified. The right middle and bilateral anterior cerebral arteries are patent without high-grade proximal stenosis. No intracranial aneurysm is identified. The intracranial vertebral arteries are patent bilaterally. Sites of up to moderate stenosis within the V4 right vertebral artery. No significant stenosis within the intracranial left vertebral artery. The basilar artery is patent without significant stenosis. The bilateral posterior cerebral arteries are patent without significant proximal stenosis. Posterior communicating arteries are poorly delineated and may be hypoplastic or absent. IMPRESSION: MRI brain: 1. The diffusion-weighted imaging is of good quality. Two subcentimeter acute cortical infarcts are present within the left parietal lobe. No other acute infarct. 2. The remainder of the examination is significantly motion degraded and limited as described. 3. Unchanged acute right cerebral convexity subdural hematoma. 4. Mild generalized parenchymal atrophy and chronic small vessel ischemic disease. MRA head: 1. Motion degraded exam. 2. Interval restoration of flow within the M1 left middle cerebral artery which is now patent without significant stenosis. 3. Redemonstrated sites of moderate atherosclerotic narrowing within the cavernous ICAs bilaterally. 4. Redemonstrated sites of up to moderate stenosis within the V4 right vertebral artery. Electronically Signed: By: Kellie Simmering DO On: 03/08/2019 14:06   MR BRAIN WO CONTRAST  Addendum Date: 03/08/2019   ADDENDUM REPORT: 03/08/2019 16:05 ADDENDUM: Please note that several sequences from a different patient's examination were initially included in this patient's brain MRI in PACS. This error has reportedly been resolved. The interpretation is  largely unchanged. However, upon further review there are additional punctate acute infarcts noted within the left caudate nucleus (series 2, image 27) and left lentiform nucleus (series 2, image 26). Addendum discussed with Dr. Erlinda Hong by telephone at 4 p.m. on 03/08/2019. Electronically Signed   By: Kellie Simmering DO   On: 03/08/2019 16:05   Result Date: 03/08/2019 CLINICAL DATA:  Stroke, follow-up. EXAM: MRI HEAD WITHOUT CONTRAST MRA HEAD WITHOUT CONTRAST TECHNIQUE: Multiplanar, multiecho pulse sequences of the brain and surrounding structures were obtained without intravenous contrast. Angiographic images of the head were obtained using MRA technique without contrast. COMPARISON:  Noncontrast CT head and CT angiogram head/neck 03/07/2019, report from catheter based angiography performed 03/07/2019 FINDINGS: MRI HEAD FINDINGS Brain: The diffusion-weighted imaging is of good quality. The remainder of the examination is significantly motion degraded. Most notably there is moderate moderate/severe motion degradation of the sagittal T1 FLAIR sequence, moderate/severe motion degradation of the axial T2 weighted sequence, severe motion degradation of the axial T2 FLAIR sequence, severe motion degradation of the coronal T2 weighted sequence, severe motion degradation of the axial GRE sequence and moderate motion degradation of the axial T1 weighted sequence. There are only two subcentimeter cortically based acute infarcts within the left parietal lobe. No other acute infarct is identified.  Within the limitations of motion degradation, an acute right cerebral convexity subdural hematoma is unchanged in size, measuring 10 mm in thickness. Only minimal mass effect upon the underlying right cerebral hemisphere. No midline shift. Previously demonstrated mild chronic small vessel ischemic disease within the cerebral hemispheres and pons is suboptimally assessed. Mild generalized parenchymal atrophy. Vascular: Reported separately.  Skull and upper cervical spine: Within limitations of motion degradation, no focal marrow lesion is identified. Incompletely assessed upper cervical spondylosis. Sinuses/Orbits: Visualized orbits demonstrate no acute abnormality. Mild scattered paranasal sinus mucosal thickening. No significant mastoid effusion. MRA HEAD FINDINGS The examination is moderately motion degraded, limiting evaluation. The intracranial internal carotid arteries are patent bilaterally. Redemonstrated moderate stenosis within the bilateral cavernous segments. The M1 left middle cerebral artery is now patent without significant stenosis. No left M2 proximal branch occlusion or high-grade proximal stenosis is identified. The right middle and bilateral anterior cerebral arteries are patent without high-grade proximal stenosis. No intracranial aneurysm is identified. The intracranial vertebral arteries are patent bilaterally. Sites of up to moderate stenosis within the V4 right vertebral artery. No significant stenosis within the intracranial left vertebral artery. The basilar artery is patent without significant stenosis. The bilateral posterior cerebral arteries are patent without significant proximal stenosis. Posterior communicating arteries are poorly delineated and may be hypoplastic or absent. IMPRESSION: MRI brain: 1. The diffusion-weighted imaging is of good quality. Two subcentimeter acute cortical infarcts are present within the left parietal lobe. No other acute infarct. 2. The remainder of the examination is significantly motion degraded and limited as described. 3. Unchanged acute right cerebral convexity subdural hematoma. 4. Mild generalized parenchymal atrophy and chronic small vessel ischemic disease. MRA head: 1. Motion degraded exam. 2. Interval restoration of flow within the M1 left middle cerebral artery which is now patent without significant stenosis. 3. Redemonstrated sites of moderate atherosclerotic narrowing within  the cavernous ICAs bilaterally. 4. Redemonstrated sites of up to moderate stenosis within the V4 right vertebral artery. Electronically Signed: By: Kellie Simmering DO On: 03/08/2019 14:06   DG Abd Portable 1V  Result Date: 03/07/2019 CLINICAL DATA:  Orogastric tube placement. EXAM: PORTABLE ABDOMEN - 1 VIEW COMPARISON:  December 12, 2014. FINDINGS: The bowel gas pattern is normal. Distal tip of enteric tube is seen in proximal stomach. No radio-opaque calculi or other significant radiographic abnormality are seen. IMPRESSION: Distal tip of enteric tube seen in proximal stomach. No evidence of bowel obstruction or ileus. Electronically Signed   By: Marijo Conception M.D.   On: 03/07/2019 12:44    Review of Systems  Constitutional: Positive for fatigue. Negative for appetite change and chills.  HENT: Negative.   Eyes: Negative.   Respiratory: Negative for cough, chest tightness and shortness of breath.   Cardiovascular: Positive for leg swelling. Negative for chest pain and palpitations.  Gastrointestinal: Negative for diarrhea, nausea and vomiting.  Genitourinary: Negative for dysuria, flank pain, frequency and hematuria.  Musculoskeletal: Positive for arthralgias and back pain. Negative for gait problem and joint swelling.  Skin: Negative for color change and rash.  Neurological: Positive for headaches. Negative for dizziness and seizures.   Blood pressure 102/73, pulse 96, temperature 99.2 F (37.3 C), temperature source Oral, resp. rate 20, height 5\' 2"  (1.575 m), weight 113 kg, SpO2 97 %. Physical Exam  Nursing note and vitals reviewed. Constitutional: She is oriented to person, place, and time. She appears well-developed and well-nourished.  Obese woman, comfortably eating lunch  HENT:  Head: Normocephalic and atraumatic.  Mouth/Throat: Oropharynx is clear and moist.  Eyes: Pupils are equal, round, and reactive to light. EOM are normal. No scleral icterus.  Neck: No JVD present.   Cardiovascular: Normal heart sounds.  No murmur heard. Regular bradycardia  Respiratory: Effort normal and breath sounds normal. She has no wheezes. She has no rales.  GI: Soft. Bowel sounds are normal. There is no abdominal tenderness. There is no rebound and no guarding.  Musculoskeletal:        General: Edema present. Normal range of motion.     Cervical back: Normal range of motion and neck supple.     Comments: Venous stasis changes over lower extremities  Neurological: She is alert and oriented to person, place, and time.  Skin: Skin is warm and dry. No rash noted.    Assessment/Plan: 1.  Chronic kidney disease stage IV: Appears to be multifactorial and concern raised with recent exposure to iodinated intravenous contrast for possible development of contrast nephropathy versus ATN from relative hypotension.  I agree with de-escalating antihypertensive therapy at this time to allow for improving renal perfusion/cerebral perfusion status post CVA. 2.  Metabolic acidosis: With small anion gap, likely associated with slight worsening of renal function and underlying chronic kidney disease.  Will check urine electrolytes and begin oral sodium bicarbonate. 3.  Hyperkalemia: Likely from renal insufficiency and metabolic acidosis, status post treatment with Kayexalate and Lokelma.  Labs rechecked noted for this afternoon. 4.  Left MCA CVA status post cerebral angiogram with thrombectomy/revascularization by IR.  Not a candidate for anticoagulation due to associated subdural hematoma. 5.  Acute exacerbation of diastolic heart failure: Appears to be compensated at this time, monitor on current dose of furosemide. 6.  Hypertension: Blood pressure currently appears to be under good control with transient hypotension noted on furosemide and as needed hydralazine.  Previously on amlodipine and hydralazine that are currently on hold.  Von Quintanar K. 03/09/2019, 12:27 PM

## 2019-03-09 NOTE — Progress Notes (Signed)
Pt transferred from [id number] to [id number] after report.

## 2019-03-09 NOTE — Consult Note (Signed)
Medical Consultation   [name]  NUU:[id number]  DOB: 1945/05/12  DOA: 03/04/2019  PCP: Benito Mccreedy, MD   Outpatient Specialists: Prudence Davidson - podiatry   Requesting physician: Stroke team  Reason for consultation: Patient initially admitted for CHF.  Has had a stroke in-hospital, doing well from a stroke standpoint.  She is having junctional rhythm, increased K+, worsening renal function.  They prefer that we assume care of the patient.   History of Present Illness: [name] is an 74 y.o. female with h/o DM; seizures; OSA; morbid obesity (BMI 46); HTN; HLD: stage 4 CKD; chronic diastolic CHF.  She was admitted on 2/7 to the cardiology team with acute on chronic diastolic CHF and an elevated troponin.  She also complained of leg weakness and falling.  She was found to have a L MCA infarct, s/p thrombectomy and started on ASA; she is not a candidate for DAPT or AC due to SDH from her fall.  She had post-operative respiratory failure and remained intubated but is now extubated and is recommended to have CPAP qhs given h/o OSA.    She reports feeling ok, denies specific pain.  She gets up to try to walk once a day.  She reports that her daughter knows where she is going next but she does not.      Review of Systems:  ROS As per HPI otherwise 10 point review of systems negative.    Past Medical History: Past Medical History:  Diagnosis Date   1st degree AV block 05/02/2017   Anemia    Arterial embolism and thrombosis of upper extremity (HCC)    Chronic diastolic (congestive) heart failure ([place])    a. 04/2017 Echo: EF 60-65%. Gr1 DD. Mod LVH.    CKD (chronic kidney disease) stage 4, GFR 15-29 ml/min ([place] [place]) 05/02/2017   Depression    Diabetes mellitus type 2, insulin dependent ([place]) 05/02/2017   DVT (deep venous thrombosis) ([place])    Dyslipidemia 05/02/2017   Essential hypertension    GERD (gastroesophageal reflux disease)    Hyperlipidemia     Junctional bradycardia    Morbid obesity with BMI of 45.0-49.9, adult ([place]) 05/02/2017   OSA (obstructive sleep apnea) 10/17/2012   Rhabdomyolysis 09/25/2012   Seizures ([facility])    "epilepsy; they think I had one a couple months ago" (12/11/2014)   Symptomatic bradycardia 11/27/2016    Past Surgical History: Past Surgical History:  Procedure Laterality Date   IR CT HEAD LTD  03/07/2019   IR PERCUTANEOUS ART THROMBECTOMY/INFUSION INTRACRANIAL INC DIAG ANGIO  03/07/2019   RADIOLOGY WITH ANESTHESIA N/A 03/07/2019   Procedure: IR WITH ANESTHESIA;  Surgeon: Radiologist, Medication, MD;  Location: [facility];  Service: Radiology;  Laterality: N/A;   THROMBECTOMY BRACHIAL ARTERY Left 12/15/2014   Procedure: THROMBECTOMY BRACHIAL ARTERY; VEIN PATCH ANGIOPLASTY LEFT BRACIAL ARTERY;  Surgeon: Angelia Mould, MD;  Location: [facility];  Service: Vascular;  Laterality: Left;   TUBAL LIGATION       Allergies:   Allergies  Allergen Reactions   Codeine Nausea And Vomiting   Metoprolol Other (See Comments)    Severe bradycardia.  Now getting admitted for 3rd time in 3 years for bradycardia onset after being started on toprol.  Please dont put patient on this med anymore!     Social History:  reports that she has never smoked. She has never used smokeless tobacco. She reports that  she does not drink alcohol or use drugs.   Family History: Family History  Problem Relation Age of Onset   Hypertension Mother        died @ 3 - unknown cause.   Hypertension Father        says she doesn't know anything about her father.   Cancer Maternal Aunt    Seizures Neg Hx       Physical Exam: Vitals:   03/09/19 0800 03/09/19 1244 03/09/19 1300 03/09/19 1550  BP:  91/60 (!) 119/44 (!) 138/39  Pulse: 96 (!) 107 (!) 57 (!) 55  Resp:  18  20  Temp: 99.2 F (37.3 C) 98.2 F (36.8 C)  98.8 F (37.1 C)  TempSrc: Oral Oral  Oral  SpO2:  100%  100%  Weight:      Height:        Constitutional:  Alert and awake, oriented x3, not in any acute distress. Eyes: PERLA, EOMI, irises appear normal, anicteric sclera,  ENMT: external ears and nose appear normal, normal hearing, Lips appear normal, oropharynx mucosa, tongue, posterior pharynx appear normal  Neck: neck appears normal, no masses, normal ROM, no thyromegaly, no JVD  CVS: S1-S2 clear, no murmur rubs or gallops, no LE edema, normal pedal pulses  Respiratory:  clear to auscultation bilaterally, no wheezing, rales or rhonchi. Respiratory effort normal. No accessory muscle use.  Abdomen: soft nontender, nondistended, normal bowel sounds Musculoskeletal: : no cyanosis, clubbing or edema noted bilaterally Neuro: Cranial nerves II-XII intact, strength, sensation, reflexes Psych: judgement and insight appear normal, stable mood and affect, mental status Skin: no rashes or lesions or ulcers, no induration or nodules    Data reviewed:  I have personally reviewed the recent labs and imaging studies  Pertinent Labs:   K+ 6.0 CO2 12; 20 on 2/11 Glucose 121 BUN 50/Creatinine 3.27/GFR 16; 42/2.51/22 on 2/11 - appears to be stable from baseline A1c 5.6 Hgb 8.4 on 2/11   Inpatient Medications:   Scheduled Meds:   stroke: mapping our early stages of recovery book   Does not apply Once   aspirin EC  81 mg Oral Daily   carbamazepine  400 mg Oral BID   Chlorhexidine Gluconate Cloth  6 each Topical Daily   citalopram  20 mg Oral Daily   ezetimibe  10 mg Oral Daily   And   simvastatin  40 mg Per Tube Daily   [START ON 03/10/2019] furosemide  40 mg Oral Daily   insulin aspart  0-5 Units Subcutaneous QHS   insulin aspart  0-6 Units Subcutaneous TID WC   mirtazapine  15 mg Oral QHS   pantoprazole sodium  40 mg Oral Daily   sodium bicarbonate  1,300 mg Oral TID   sodium chloride flush  3 mL Intravenous Q12H   Continuous Infusions:  sodium chloride       Radiological Exams on Admission: MR ANGIO HEAD WO  CONTRAST  Addendum Date: 03/08/2019   ADDENDUM REPORT: 03/08/2019 16:05 ADDENDUM: Please note that several sequences from a different patient's examination were initially included in this patient's brain MRI in PACS. This error has reportedly been resolved. The interpretation is largely unchanged. However, upon further review there are additional punctate acute infarcts noted within the left caudate nucleus (series 2, image 27) and left lentiform nucleus (series 2, image 26). Addendum discussed with Dr. Erlinda Hong by telephone at 4 p.m. on 03/08/2019. Electronically Signed   By: Kellie Simmering DO   On: 03/08/2019 16:05  Result Date: 03/08/2019 CLINICAL DATA:  Stroke, follow-up. EXAM: MRI HEAD WITHOUT CONTRAST MRA HEAD WITHOUT CONTRAST TECHNIQUE: Multiplanar, multiecho pulse sequences of the brain and surrounding structures were obtained without intravenous contrast. Angiographic images of the head were obtained using MRA technique without contrast. COMPARISON:  Noncontrast CT head and CT angiogram head/neck 03/07/2019, report from catheter based angiography performed 03/07/2019 FINDINGS: MRI HEAD FINDINGS Brain: The diffusion-weighted imaging is of good quality. The remainder of the examination is significantly motion degraded. Most notably there is moderate moderate/severe motion degradation of the sagittal T1 FLAIR sequence, moderate/severe motion degradation of the axial T2 weighted sequence, severe motion degradation of the axial T2 FLAIR sequence, severe motion degradation of the coronal T2 weighted sequence, severe motion degradation of the axial GRE sequence and moderate motion degradation of the axial T1 weighted sequence. There are only two subcentimeter cortically based acute infarcts within the left parietal lobe. No other acute infarct is identified. Within the limitations of motion degradation, an acute right cerebral convexity subdural hematoma is unchanged in size, measuring 10 mm in thickness. Only  minimal mass effect upon the underlying right cerebral hemisphere. No midline shift. Previously demonstrated mild chronic small vessel ischemic disease within the cerebral hemispheres and pons is suboptimally assessed. Mild generalized parenchymal atrophy. Vascular: Reported separately. Skull and upper cervical spine: Within limitations of motion degradation, no focal marrow lesion is identified. Incompletely assessed upper cervical spondylosis. Sinuses/Orbits: Visualized orbits demonstrate no acute abnormality. Mild scattered paranasal sinus mucosal thickening. No significant mastoid effusion. MRA HEAD FINDINGS The examination is moderately motion degraded, limiting evaluation. The intracranial internal carotid arteries are patent bilaterally. Redemonstrated moderate stenosis within the bilateral cavernous segments. The M1 left middle cerebral artery is now patent without significant stenosis. No left M2 proximal branch occlusion or high-grade proximal stenosis is identified. The right middle and bilateral anterior cerebral arteries are patent without high-grade proximal stenosis. No intracranial aneurysm is identified. The intracranial vertebral arteries are patent bilaterally. Sites of up to moderate stenosis within the V4 right vertebral artery. No significant stenosis within the intracranial left vertebral artery. The basilar artery is patent without significant stenosis. The bilateral posterior cerebral arteries are patent without significant proximal stenosis. Posterior communicating arteries are poorly delineated and may be hypoplastic or absent. IMPRESSION: MRI brain: 1. The diffusion-weighted imaging is of good quality. Two subcentimeter acute cortical infarcts are present within the left parietal lobe. No other acute infarct. 2. The remainder of the examination is significantly motion degraded and limited as described. 3. Unchanged acute right cerebral convexity subdural hematoma. 4. Mild generalized  parenchymal atrophy and chronic small vessel ischemic disease. MRA head: 1. Motion degraded exam. 2. Interval restoration of flow within the M1 left middle cerebral artery which is now patent without significant stenosis. 3. Redemonstrated sites of moderate atherosclerotic narrowing within the cavernous ICAs bilaterally. 4. Redemonstrated sites of up to moderate stenosis within the V4 right vertebral artery. Electronically Signed: By: Kellie Simmering DO On: 03/08/2019 14:06   MR BRAIN WO CONTRAST  Addendum Date: 03/08/2019   ADDENDUM REPORT: 03/08/2019 16:05 ADDENDUM: Please note that several sequences from a different patient's examination were initially included in this patient's brain MRI in PACS. This error has reportedly been resolved. The interpretation is largely unchanged. However, upon further review there are additional punctate acute infarcts noted within the left caudate nucleus (series 2, image 27) and left lentiform nucleus (series 2, image 26). Addendum discussed with Dr. Erlinda Hong by telephone at 4 p.m. on 03/08/2019. Electronically  Signed   By: Kellie Simmering DO   On: 03/08/2019 16:05   Result Date: 03/08/2019 CLINICAL DATA:  Stroke, follow-up. EXAM: MRI HEAD WITHOUT CONTRAST MRA HEAD WITHOUT CONTRAST TECHNIQUE: Multiplanar, multiecho pulse sequences of the brain and surrounding structures were obtained without intravenous contrast. Angiographic images of the head were obtained using MRA technique without contrast. COMPARISON:  Noncontrast CT head and CT angiogram head/neck 03/07/2019, report from catheter based angiography performed 03/07/2019 FINDINGS: MRI HEAD FINDINGS Brain: The diffusion-weighted imaging is of good quality. The remainder of the examination is significantly motion degraded. Most notably there is moderate moderate/severe motion degradation of the sagittal T1 FLAIR sequence, moderate/severe motion degradation of the axial T2 weighted sequence, severe motion degradation of the axial T2  FLAIR sequence, severe motion degradation of the coronal T2 weighted sequence, severe motion degradation of the axial GRE sequence and moderate motion degradation of the axial T1 weighted sequence. There are only two subcentimeter cortically based acute infarcts within the left parietal lobe. No other acute infarct is identified. Within the limitations of motion degradation, an acute right cerebral convexity subdural hematoma is unchanged in size, measuring 10 mm in thickness. Only minimal mass effect upon the underlying right cerebral hemisphere. No midline shift. Previously demonstrated mild chronic small vessel ischemic disease within the cerebral hemispheres and pons is suboptimally assessed. Mild generalized parenchymal atrophy. Vascular: Reported separately. Skull and upper cervical spine: Within limitations of motion degradation, no focal marrow lesion is identified. Incompletely assessed upper cervical spondylosis. Sinuses/Orbits: Visualized orbits demonstrate no acute abnormality. Mild scattered paranasal sinus mucosal thickening. No significant mastoid effusion. MRA HEAD FINDINGS The examination is moderately motion degraded, limiting evaluation. The intracranial internal carotid arteries are patent bilaterally. Redemonstrated moderate stenosis within the bilateral cavernous segments. The M1 left middle cerebral artery is now patent without significant stenosis. No left M2 proximal branch occlusion or high-grade proximal stenosis is identified. The right middle and bilateral anterior cerebral arteries are patent without high-grade proximal stenosis. No intracranial aneurysm is identified. The intracranial vertebral arteries are patent bilaterally. Sites of up to moderate stenosis within the V4 right vertebral artery. No significant stenosis within the intracranial left vertebral artery. The basilar artery is patent without significant stenosis. The bilateral posterior cerebral arteries are patent without  significant proximal stenosis. Posterior communicating arteries are poorly delineated and may be hypoplastic or absent. IMPRESSION: MRI brain: 1. The diffusion-weighted imaging is of good quality. Two subcentimeter acute cortical infarcts are present within the left parietal lobe. No other acute infarct. 2. The remainder of the examination is significantly motion degraded and limited as described. 3. Unchanged acute right cerebral convexity subdural hematoma. 4. Mild generalized parenchymal atrophy and chronic small vessel ischemic disease. MRA head: 1. Motion degraded exam. 2. Interval restoration of flow within the M1 left middle cerebral artery which is now patent without significant stenosis. 3. Redemonstrated sites of moderate atherosclerotic narrowing within the cavernous ICAs bilaterally. 4. Redemonstrated sites of up to moderate stenosis within the V4 right vertebral artery. Electronically Signed: By: Kellie Simmering DO On: 03/08/2019 14:06    Impression/Recommendations Principal Problem:   Middle cerebral artery embolism, left Active Problems:   Hyperlipidemia   OSA (obstructive sleep apnea)   Essential hypertension   Junctional bradycardia   Seizures disorder   Diabetes mellitus type 2, insulin dependent ([facility])   Morbid obesity with BMI of 45.0-49.9, adult (HCC)   CKD (chronic kidney disease) stage 4, GFR 15-29 ml/min (HCC)   Acute hyperkalemia   Acute  on chronic diastolic CHF (congestive heart failure) ([facility])   CVA with SDH -s/p thrombectomy/revascularization by IR -She had a SDH from pre-hospital fall and so is only a candidate for ASA (no DAPT, AC at this time) -She appears likely to need SNF rehab and may be nearing this disposition  Acute on chronic diastolic CHF, junctional bradycardia -Initially admitted for CHF exacerbation, which appears to have improved -Cards consulting -Continue Lasix 40 mg IV daily -Junctional rhythm this AM was thought to be related to hyperkalemia and  so would be expected to improve with treatment; she was given 1 g of calcium gluconate  Stage IV CKD with hyperkalemia -In review of chart back to 2016, this appears to be stable -She did have hyperkalemia today and was given kayexalate -She has a metabolic acidosis and so I have ordered HCO3 -Nephrology consult requested -She may be approaching ESRD, but given her stability over several years this is not clearly the case at this time -Dr. Posey Pronto has recommended de-escalating antihypertensive therapy to see if this improves renal perfusion at this time  HTN -Holding Norvasc and hydralazine (giving this prn) for now  HLD -Continue Vytorin -Good control on recent lipid panel  DM -A1c is at goal, 5.6 -Continue 70/30 -Hold Glipzide  OSA -Continue CPAP  Seizure d/o -Continue Tegretol  Morbid obesity -BMI 46 -Weight loss should be encouraged    Thank you for this consultation.  Our [facility] hospitalist team will assume care of the patient tomorrow (2/13).  Time Spent: 35 minutes  Karmen Bongo M.D. [facility] 03/09/2019, 7:07 PM

## 2019-03-09 NOTE — Progress Notes (Signed)
Pt foley removed per order; 128ml urine emptied from bag; UA and ordered urine labs sent prior to foley removal. Will continue to closely monitor pt. Delia Heady RN

## 2019-03-09 NOTE — Progress Notes (Signed)
Pt daughter Juliann Pulse called; she informed RN she is the emergency contact and will like for MD to please call her with updates. Thank you

## 2019-03-09 NOTE — Progress Notes (Addendum)
Progress Note  Patient Name: [name] Date of Encounter: 03/09/2019  Primary Cardiologist: Lauree Chandler, MD   Subjective   Patient is feeling well this morning. Has not gotten out of bed much. Denies chest pain or shortness of breath.   Inpatient Medications    Scheduled Meds:   stroke: mapping our early stages of recovery book   Does not apply Once   aspirin EC  81 mg Oral Daily   carbamazepine  400 mg Oral BID   Chlorhexidine Gluconate Cloth  6 each Topical Daily   citalopram  20 mg Oral Daily   ezetimibe  10 mg Oral Daily   And   simvastatin  40 mg Per Tube Daily   furosemide  40 mg Per Tube Daily   insulin aspart  0-5 Units Subcutaneous QHS   insulin aspart  0-6 Units Subcutaneous TID WC   mirtazapine  15 mg Oral QHS   pantoprazole sodium  40 mg Oral Daily   sodium chloride flush  3 mL Intravenous Q12H   Continuous Infusions:  sodium chloride     PRN Meds: sodium chloride, acetaminophen **OR** acetaminophen (TYLENOL) oral liquid 160 mg/5 mL **OR** acetaminophen, acetaminophen, hydrALAZINE, nitroGLYCERIN, ondansetron (ZOFRAN) IV, sodium chloride flush   Vital Signs    Vitals:   03/09/19 0400 03/09/19 0500 03/09/19 0600 03/09/19 0714  BP: (!) 86/59 105/69 116/81 102/73  Pulse: (!) 57 (!) 58 64 (!) 41  Resp: 17 17 18 20   Temp: 98.1 F (36.7 C)     TempSrc: Oral     SpO2: 97% 94% 94% 97%  Weight:  113 kg    Height:        Intake/Output Summary (Last 24 hours) at 03/09/2019 0749 Last data filed at 03/09/2019 0530 Gross per 24 hour  Intake 438.38 ml  Output 1050 ml  Net -611.62 ml   Last 3 Weights 03/09/2019 03/07/2019 03/06/2019  Weight (lbs) 249 lb 1.9 oz 242 lb 8.1 oz 241 lb 6.5 oz  Weight (kg) 113 kg 110 kg 109.5 kg      Telemetry    Junctional rhythm vs sinus with PACs - Personally Reviewed  ECG    No new tracings - Personally Reviewed  Physical Exam   GEN: No acute distress.   Neck: No JVD Cardiac: RRR, no murmurs,  rubs, or gallops.  Respiratory: basilar crackles, otherwise clear GI: Soft, nontender, non-distended  MS: trace edema in BLE; No deformity. Neuro:  Nonfocal  Psych: Normal affect   Labs    High Sensitivity Troponin:   Recent Labs  Lab 03/04/19 0954 03/04/19 1154 03/04/19 1557 03/04/19 1809  TROPONINIHS 483* 495* 456* 431*      Chemistry Recent Labs  Lab 03/02/19 0855 03/04/19 0954 03/06/19 0846 03/06/19 0846 03/07/19 0542 03/07/19 0542 03/07/19 1228 03/07/19 1602 03/08/19 0545  NA 135   < > 136   < > 136   < > 136 135 137  K 4.1   < > 4.5   < > 4.5   < > 4.2 4.2 4.2  CL 101   < > 101  --  104  --   --   --  105  CO2 21*   < > 25  --  23  --   --   --  20*  GLUCOSE 250*   < > 192*  --  125*  --   --   --  162*  BUN 60*   < > 56*  --  52*  --   --   --  42*  CREATININE 3.43*   < > 3.34*  --  3.17*  --   --   --  2.51*  CALCIUM 8.8*   < > 8.3*  --  8.4*  --   --   --  8.2*  PROT 7.4  --   --   --   --   --   --   --   --   ALBUMIN 4.0  --   --   --   --   --   --   --   --   AST 17  --   --   --   --   --   --   --   --   ALT 13  --   --   --   --   --   --   --   --   ALKPHOS 101  --   --   --   --   --   --   --   --   BILITOT 1.0  --   --   --   --   --   --   --   --   GFRNONAA 13*   < > 13*  --  14*  --   --   --  19*  GFRAA 15*   < > 15*  --  16*  --   --   --  22*  ANIONGAP 13   < > 10  --  9  --   --   --  12   < > = values in this interval not displayed.     Hematology Recent Labs  Lab 03/04/19 1557 03/04/19 1557 03/06/19 6433 03/06/19 0853 03/07/19 1228 03/07/19 1602 03/08/19 0545  WBC 6.0  --  4.6  --   --   --  7.4  RBC 3.04*  --  2.96*  --   --   --  2.68*  HGB 9.6*   < > 9.3*   < > 9.2* 8.8* 8.4*  HCT 27.5*   < > 28.0*   < > 27.0* 26.0* 25.5*  MCV 90.5  --  94.6  --   --   --  95.1  MCH 31.6  --  31.4  --   --   --  31.3  MCHC 34.9  --  33.2  --   --   --  32.9  RDW 11.9  --  12.1  --   --   --  12.5  PLT 182  --  205  --   --   --  198     < > = values in this interval not displayed.    BNP Recent Labs  Lab 03/04/19 0954  BNP 1,112.8*     DDimer  Recent Labs  Lab 03/06/19 1013  DDIMER 2.20*     Radiology    CT ANGIO HEAD W OR WO CONTRAST  Result Date: 03/07/2019 CLINICAL DATA:  Recent fall. Acute onset of aphasia, gaze deviation, and right-sided hemiplegia. Last seen normal 1 hour ago. EXAM: CT ANGIOGRAPHY HEAD AND NECK TECHNIQUE: Multidetector CT imaging of the head and neck was performed using the standard protocol during bolus administration of intravenous contrast. Multiplanar CT image reconstructions and MIPs were obtained to evaluate the vascular anatomy. Carotid stenosis measurements (when applicable) are obtained utilizing NASCET criteria, using the distal internal  carotid diameter as the denominator. CONTRAST:  Dose is currently not available COMPARISON:  None FINDINGS: CTA NECK FINDINGS Aortic arch: Atherosclerotic plaque.  Three vessel branching. Right carotid system: Vessels are smooth and widely patent. No atheromatous changes, notable for age. Left carotid system: Vessels are smooth and widely patent. Minimal atheromatous changes at the bulb. Distal left ICA loop. Vertebral arteries: No proximal subclavian atherosclerosis. Symmetric vertebral arteries that are smooth and widely patent to the dura. Skeleton: Bulky ventral endplate spurring in the cervical and upper thoracic spine consistent with diffuse idiopathic skeletal hyperostosis. Other neck: No acute finding. Partial retropharyngeal course of the bilateral carotids. Upper chest: Mild patchy air trapping in the apical lungs. Review of the MIP images confirms the above findings CTA HEAD FINDINGS Anterior circulation: Abrupt left M1 cut off with distal reconstitution but under filled branches. There is atherosclerotic plaque along both carotid siphons with up to 50% stenosis at the right mid cavernous segment. No right-sided or anterior cerebral embolism is  seen. Posterior circulation: Calcified plaque on both carotid siphons with subjective moderate narrowing of the right vertebral artery due to calcified plaque-quantification by measurement limited due to small vessel size and plaque blooming. Subsequent mild right V4 luminal narrowing from low-density plaque. The basilar is smooth and widely patent. Symmetric robust flow in cerebellar and posterior cerebral branches. Venous sinuses: Patent Anatomic variants: None significant Review of the MIP images confirms the above findings Critical Value/emergent results were called by telephone at the time of interpretation on 03/07/2019 at 7:58 am to provider Samara Snide , who verbally acknowledged these results. IMPRESSION: 1. Emergent large vessel occlusion at the left M1 segment. There is underfilling of downstream reconstituted vessels. 2. Limited atherosclerosis in the cervical carotids with no ulceration. There is a notable left ICA loop at the skull base. 3. Moderate atheromatous narrowing at the right V4 segment. Electronically Signed   By: Monte Fantasia M.D.   On: 03/07/2019 08:07   CT ANGIO NECK W OR WO CONTRAST  Result Date: 03/07/2019 CLINICAL DATA:  Recent fall. Acute onset of aphasia, gaze deviation, and right-sided hemiplegia. Last seen normal 1 hour ago. EXAM: CT ANGIOGRAPHY HEAD AND NECK TECHNIQUE: Multidetector CT imaging of the head and neck was performed using the standard protocol during bolus administration of intravenous contrast. Multiplanar CT image reconstructions and MIPs were obtained to evaluate the vascular anatomy. Carotid stenosis measurements (when applicable) are obtained utilizing NASCET criteria, using the distal internal carotid diameter as the denominator. CONTRAST:  Dose is currently not available COMPARISON:  None FINDINGS: CTA NECK FINDINGS Aortic arch: Atherosclerotic plaque.  Three vessel branching. Right carotid system: Vessels are smooth and widely patent. No atheromatous  changes, notable for age. Left carotid system: Vessels are smooth and widely patent. Minimal atheromatous changes at the bulb. Distal left ICA loop. Vertebral arteries: No proximal subclavian atherosclerosis. Symmetric vertebral arteries that are smooth and widely patent to the dura. Skeleton: Bulky ventral endplate spurring in the cervical and upper thoracic spine consistent with diffuse idiopathic skeletal hyperostosis. Other neck: No acute finding. Partial retropharyngeal course of the bilateral carotids. Upper chest: Mild patchy air trapping in the apical lungs. Review of the MIP images confirms the above findings CTA HEAD FINDINGS Anterior circulation: Abrupt left M1 cut off with distal reconstitution but under filled branches. There is atherosclerotic plaque along both carotid siphons with up to 50% stenosis at the right mid cavernous segment. No right-sided or anterior cerebral embolism is seen. Posterior circulation: Calcified plaque on  both carotid siphons with subjective moderate narrowing of the right vertebral artery due to calcified plaque-quantification by measurement limited due to small vessel size and plaque blooming. Subsequent mild right V4 luminal narrowing from low-density plaque. The basilar is smooth and widely patent. Symmetric robust flow in cerebellar and posterior cerebral branches. Venous sinuses: Patent Anatomic variants: None significant Review of the MIP images confirms the above findings Critical Value/emergent results were called by telephone at the time of interpretation on 03/07/2019 at 7:58 am to provider Samara Snide , who verbally acknowledged these results. IMPRESSION: 1. Emergent large vessel occlusion at the left M1 segment. There is underfilling of downstream reconstituted vessels. 2. Limited atherosclerosis in the cervical carotids with no ulceration. There is a notable left ICA loop at the skull base. 3. Moderate atheromatous narrowing at the right V4 segment.  Electronically Signed   By: Monte Fantasia M.D.   On: 03/07/2019 08:07   MR ANGIO HEAD WO CONTRAST  Addendum Date: 03/08/2019   ADDENDUM REPORT: 03/08/2019 16:05 ADDENDUM: Please note that several sequences from a different patient's examination were initially included in this patient's brain MRI in PACS. This error has reportedly been resolved. The interpretation is largely unchanged. However, upon further review there are additional punctate acute infarcts noted within the left caudate nucleus (series 2, image 27) and left lentiform nucleus (series 2, image 26). Addendum discussed with Dr. Erlinda Hong by telephone at 4 p.m. on 03/08/2019. Electronically Signed   By: Kellie Simmering DO   On: 03/08/2019 16:05   Result Date: 03/08/2019 CLINICAL DATA:  Stroke, follow-up. EXAM: MRI HEAD WITHOUT CONTRAST MRA HEAD WITHOUT CONTRAST TECHNIQUE: Multiplanar, multiecho pulse sequences of the brain and surrounding structures were obtained without intravenous contrast. Angiographic images of the head were obtained using MRA technique without contrast. COMPARISON:  Noncontrast CT head and CT angiogram head/neck 03/07/2019, report from catheter based angiography performed 03/07/2019 FINDINGS: MRI HEAD FINDINGS Brain: The diffusion-weighted imaging is of good quality. The remainder of the examination is significantly motion degraded. Most notably there is moderate moderate/severe motion degradation of the sagittal T1 FLAIR sequence, moderate/severe motion degradation of the axial T2 weighted sequence, severe motion degradation of the axial T2 FLAIR sequence, severe motion degradation of the coronal T2 weighted sequence, severe motion degradation of the axial GRE sequence and moderate motion degradation of the axial T1 weighted sequence. There are only two subcentimeter cortically based acute infarcts within the left parietal lobe. No other acute infarct is identified. Within the limitations of motion degradation, an acute right  cerebral convexity subdural hematoma is unchanged in size, measuring 10 mm in thickness. Only minimal mass effect upon the underlying right cerebral hemisphere. No midline shift. Previously demonstrated mild chronic small vessel ischemic disease within the cerebral hemispheres and pons is suboptimally assessed. Mild generalized parenchymal atrophy. Vascular: Reported separately. Skull and upper cervical spine: Within limitations of motion degradation, no focal marrow lesion is identified. Incompletely assessed upper cervical spondylosis. Sinuses/Orbits: Visualized orbits demonstrate no acute abnormality. Mild scattered paranasal sinus mucosal thickening. No significant mastoid effusion. MRA HEAD FINDINGS The examination is moderately motion degraded, limiting evaluation. The intracranial internal carotid arteries are patent bilaterally. Redemonstrated moderate stenosis within the bilateral cavernous segments. The M1 left middle cerebral artery is now patent without significant stenosis. No left M2 proximal branch occlusion or high-grade proximal stenosis is identified. The right middle and bilateral anterior cerebral arteries are patent without high-grade proximal stenosis. No intracranial aneurysm is identified. The intracranial vertebral arteries are patent bilaterally.  Sites of up to moderate stenosis within the V4 right vertebral artery. No significant stenosis within the intracranial left vertebral artery. The basilar artery is patent without significant stenosis. The bilateral posterior cerebral arteries are patent without significant proximal stenosis. Posterior communicating arteries are poorly delineated and may be hypoplastic or absent. IMPRESSION: MRI brain: 1. The diffusion-weighted imaging is of good quality. Two subcentimeter acute cortical infarcts are present within the left parietal lobe. No other acute infarct. 2. The remainder of the examination is significantly motion degraded and limited as  described. 3. Unchanged acute right cerebral convexity subdural hematoma. 4. Mild generalized parenchymal atrophy and chronic small vessel ischemic disease. MRA head: 1. Motion degraded exam. 2. Interval restoration of flow within the M1 left middle cerebral artery which is now patent without significant stenosis. 3. Redemonstrated sites of moderate atherosclerotic narrowing within the cavernous ICAs bilaterally. 4. Redemonstrated sites of up to moderate stenosis within the V4 right vertebral artery. Electronically Signed: By: Kellie Simmering DO On: 03/08/2019 14:06   MR BRAIN WO CONTRAST  Addendum Date: 03/08/2019   ADDENDUM REPORT: 03/08/2019 16:05 ADDENDUM: Please note that several sequences from a different patient's examination were initially included in this patient's brain MRI in PACS. This error has reportedly been resolved. The interpretation is largely unchanged. However, upon further review there are additional punctate acute infarcts noted within the left caudate nucleus (series 2, image 27) and left lentiform nucleus (series 2, image 26). Addendum discussed with Dr. Erlinda Hong by telephone at 4 p.m. on 03/08/2019. Electronically Signed   By: Kellie Simmering DO   On: 03/08/2019 16:05   Result Date: 03/08/2019 CLINICAL DATA:  Stroke, follow-up. EXAM: MRI HEAD WITHOUT CONTRAST MRA HEAD WITHOUT CONTRAST TECHNIQUE: Multiplanar, multiecho pulse sequences of the brain and surrounding structures were obtained without intravenous contrast. Angiographic images of the head were obtained using MRA technique without contrast. COMPARISON:  Noncontrast CT head and CT angiogram head/neck 03/07/2019, report from catheter based angiography performed 03/07/2019 FINDINGS: MRI HEAD FINDINGS Brain: The diffusion-weighted imaging is of good quality. The remainder of the examination is significantly motion degraded. Most notably there is moderate moderate/severe motion degradation of the sagittal T1 FLAIR sequence, moderate/severe  motion degradation of the axial T2 weighted sequence, severe motion degradation of the axial T2 FLAIR sequence, severe motion degradation of the coronal T2 weighted sequence, severe motion degradation of the axial GRE sequence and moderate motion degradation of the axial T1 weighted sequence. There are only two subcentimeter cortically based acute infarcts within the left parietal lobe. No other acute infarct is identified. Within the limitations of motion degradation, an acute right cerebral convexity subdural hematoma is unchanged in size, measuring 10 mm in thickness. Only minimal mass effect upon the underlying right cerebral hemisphere. No midline shift. Previously demonstrated mild chronic small vessel ischemic disease within the cerebral hemispheres and pons is suboptimally assessed. Mild generalized parenchymal atrophy. Vascular: Reported separately. Skull and upper cervical spine: Within limitations of motion degradation, no focal marrow lesion is identified. Incompletely assessed upper cervical spondylosis. Sinuses/Orbits: Visualized orbits demonstrate no acute abnormality. Mild scattered paranasal sinus mucosal thickening. No significant mastoid effusion. MRA HEAD FINDINGS The examination is moderately motion degraded, limiting evaluation. The intracranial internal carotid arteries are patent bilaterally. Redemonstrated moderate stenosis within the bilateral cavernous segments. The M1 left middle cerebral artery is now patent without significant stenosis. No left M2 proximal branch occlusion or high-grade proximal stenosis is identified. The right middle and bilateral anterior cerebral arteries are patent without  high-grade proximal stenosis. No intracranial aneurysm is identified. The intracranial vertebral arteries are patent bilaterally. Sites of up to moderate stenosis within the V4 right vertebral artery. No significant stenosis within the intracranial left vertebral artery. The basilar artery is  patent without significant stenosis. The bilateral posterior cerebral arteries are patent without significant proximal stenosis. Posterior communicating arteries are poorly delineated and may be hypoplastic or absent. IMPRESSION: MRI brain: 1. The diffusion-weighted imaging is of good quality. Two subcentimeter acute cortical infarcts are present within the left parietal lobe. No other acute infarct. 2. The remainder of the examination is significantly motion degraded and limited as described. 3. Unchanged acute right cerebral convexity subdural hematoma. 4. Mild generalized parenchymal atrophy and chronic small vessel ischemic disease. MRA head: 1. Motion degraded exam. 2. Interval restoration of flow within the M1 left middle cerebral artery which is now patent without significant stenosis. 3. Redemonstrated sites of moderate atherosclerotic narrowing within the cavernous ICAs bilaterally. 4. Redemonstrated sites of up to moderate stenosis within the V4 right vertebral artery. Electronically Signed: By: Kellie Simmering DO On: 03/08/2019 14:06   IR CT Head Ltd  Result Date: 03/07/2019 INDICATION: New onset aphasia with right-sided weakness. Occluded left middle cerebral artery M1 segment proximally on CT angiogram of the head and neck.  EXAM: 1. EMERGENT LARGE VESSEL OCCLUSION THROMBOLYSIS (anterior CIRCULATION)  COMPARISON:  CT angiogram of the head and neck of March 07, 2019.  MEDICATIONS: Ancef 2 g IV antibiotic was administered within 1 hour of the procedure.  ANESTHESIA/SEDATION: General anesthesia.  CONTRAST:  Isovue 300 approximately 50 mL.  FLUOROSCOPY TIME:  Fluoroscopy Time: 20 minutes 6 seconds (1405 mGy).  COMPLICATIONS: None immediate.  TECHNIQUE: Following a full explanation of the procedure along with the potential associated complications, an informed witnessed consent was obtained from the patient's daughter. The risks of intracranial hemorrhage of 10%, worsening neurological deficit,  ventilator dependency, death and inability to revascularize were all reviewed in detail with the patient's daughter.  The patient was then put under general anesthesia by the Department of Anesthesiology at [facility].  The right groin was prepped and draped in the usual sterile fashion. Thereafter using modified Seldinger technique, transfemoral access into the right common femoral artery was obtained without difficulty. Over a 0.035 inch guidewire a 8 French Pinnacle sheath was inserted. Through this, and also over a 0.035 inch guidewire a 5 [place] JB 1 catheter was advanced to the aortic arch region and selectively positioned in the left common carotid artery.  FINDINGS: The left common carotid arteriogram demonstrates the left external carotid artery and its major branches to be widely patent.  The left internal carotid artery at the bulb and its proximal 1/3 is widely patent.  An O shaped configuration of the mid cervical left ICA is noted.  More distally, the left internal carotid artery demonstrates wide patency to the cervical petrous junction.  Patency of the petrous, cavernous and the supraclinoid segment is noted.  There is approximately 50% stenosis at the petrous cavernous junction. The left middle cerebral artery demonstrates an angiographic occlusion in its mid M1 segment.  The left anterior cerebral artery opacifies into the capillary and venous phases.  PROCEDURE: The diagnostic JB 1 catheter in the left common carotid artery was exchanged over a 0.035 inch 300 cm Rosen exchange guidewire for an 8 [place] Pinnacle sheath in the right groin which was then connected to continuous heparinized saline infusion. Over the [facility] exchange guidewire, an 087 95 cm  balloon guide catheter which had been prepped with 50% contrast and 50% heparinized saline infusion was advanced and positioned in the proximal 1/3 of the left internal carotid artery. The guidewire was removed. Good aspiration  obtained from the hub of balloon guide catheter. A control arteriogram performed through this demonstrated no evidence of spasms, dissections or intraluminal filling defects. No change was noted in the intracranial circulation.  Over a 0.014 inch standard Synchro micro guidewire with a J configuration, the combination of an 021 Trevo ProVue microcatheter and a 6 French 132 cm Catalyst guide catheter was advanced without difficulty to the supraclinoid left ICA. The micro guidewire was then gently manipulated with a torque device and advanced through the occluded left middle cerebral artery into the M2 M3 region of the dominant inferior division followed by the microcatheter. The guidewire was removed. Good aspiration obtained from the hub of the microcatheter. A gentle control arteriogram performed through the microcatheter demonstrated safe position of the tip of the microcatheter. This was then connected to continuous heparinized saline infusion.  The 6 [place] Catalyst guide catheter was now advanced into the supraclinoid left ICA.  A 4 mm x 40 mm Solitaire X retrieval device was then advanced to the distal end of the microcatheter and deployed by slight forward gentle traction with the right hand on the delivery micro guidewire with the left hand retrieving the delivery microcatheter deploying the retrieval device.  The Catalyst guide catheter was advanced into left middle cerebral artery and imbedded in the proximal portion of the occlusion.  Proximal flow arrest was then initiated by inflating the balloon in the left internal carotid artery.  With constant aspiration being applied at the hub of the 6 [place] Catalyst guide catheter with a Penumbra aspiration device over 2 minutes, and with a 60 mL syringe at the hub of the balloon guide catheter, the combination of the retrieval device, the microcatheter and the 6 [place] Catalyst guide catheter were retrieved and removed. Proximal flow arrest was then  reversed.  A control arteriogram performed through the 087 balloon guide catheter in the left internal carotid artery demonstrated complete angiographic revascularization of the left middle and the left anterior cerebral arteries.  A TICI 3 revascularization of the left MCA distribution was achieved.  During the procedure, the patient's blood pressure and neurological status remained stable.  No evidence of extravasation or mass-effect or midline shift was noted.  The balloon guide catheter was retrieved and removed. The 8 French Pinnacle sheath in the right groin was then replaced with an 8 French Angio-Seal closure device for hemostasis. Distal pulses in the dorsalis pedis, and the posterior tibial regions remained Dopplerable bilaterally unchanged from prior to the procedure.  A flat panel CT of the brain demonstrated no evidence of spasms, dissections or of intraluminal filling defects.  The patient was left intubated on account of her body habitus to maintain her airway. She was then transferred to the neuro ICU to continue with post thrombectomy management.  IMPRESSION: Status post endovascular complete revascularization of the occluded left middle cerebral artery with 1 pass with the 4 mm x 40 mm Solitaire X retrieval device, and Penumbra aspiration achieving a TICI 3 revascularization.  PLAN: Follow up in the clinic 4 weeks post discharge.   Electronically Signed   By: Luanne Bras M.D.   On: 03/07/2019 16:38  DG CHEST PORT 1 VIEW  Result Date: 03/07/2019 CLINICAL DATA:  Endotracheal tube placement. EXAM: PORTABLE CHEST 1 VIEW COMPARISON:  March 04, 2019. FINDINGS: Stable cardiomegaly. Endotracheal and nasogastric tubes appear to be in good position. No pneumothorax or pleural effusion is noted. No acute pulmonary disease is noted. Bony thorax is unremarkable. IMPRESSION: Endotracheal and nasogastric tubes in good position. No acute cardiopulmonary abnormality seen. Electronically  Signed   By: Marijo Conception M.D.   On: 03/07/2019 11:34   DG Abd Portable 1V  Result Date: 03/07/2019 CLINICAL DATA:  Orogastric tube placement. EXAM: PORTABLE ABDOMEN - 1 VIEW COMPARISON:  December 12, 2014. FINDINGS: The bowel gas pattern is normal. Distal tip of enteric tube is seen in proximal stomach. No radio-opaque calculi or other significant radiographic abnormality are seen. IMPRESSION: Distal tip of enteric tube seen in proximal stomach. No evidence of bowel obstruction or ileus. Electronically Signed   By: Marijo Conception M.D.   On: 03/07/2019 12:44   IR PERCUTANEOUS ART THROMBECTOMY/INFUSION INTRACRANIAL INC DIAG ANGIO  Result Date: 03/08/2019 INDICATION: New onset aphasia with right-sided weakness. Occluded left middle cerebral artery M1 segment proximally on CT angiogram of the head and neck. EXAM: 1. EMERGENT LARGE VESSEL OCCLUSION THROMBOLYSIS (anterior CIRCULATION) COMPARISON:  CT angiogram of the head and neck of March 07, 2019. MEDICATIONS: Ancef 2 g IV antibiotic was administered within 1 hour of the procedure. ANESTHESIA/SEDATION: General anesthesia. CONTRAST:  Isovue 300 approximately 50 mL. FLUOROSCOPY TIME:  Fluoroscopy Time: 20 minutes 6 seconds (1405 mGy). COMPLICATIONS: None immediate. TECHNIQUE: Following a full explanation of the procedure along with the potential associated complications, an informed witnessed consent was obtained from the patient's daughter. The risks of intracranial hemorrhage of 10%, worsening neurological deficit, ventilator dependency, death and inability to revascularize were all reviewed in detail with the patient's daughter. The patient was then put under general anesthesia by the Department of Anesthesiology at [facility]. The right groin was prepped and draped in the usual sterile fashion. Thereafter using modified Seldinger technique, transfemoral access into the right common femoral artery was obtained without difficulty. Over a 0.035  inch guidewire a 8 French Pinnacle sheath was inserted. Through this, and also over a 0.035 inch guidewire a 5 [place] JB 1 catheter was advanced to the aortic arch region and selectively positioned in the left common carotid artery. FINDINGS: The left common carotid arteriogram demonstrates the left external carotid artery and its major branches to be widely patent. The left internal carotid artery at the bulb and its proximal 1/3 is widely patent. An O shaped configuration of the mid cervical left ICA is noted. More distally, the left internal carotid artery demonstrates wide patency to the cervical petrous junction. Patency of the petrous, cavernous and the supraclinoid segment is noted. There is approximately 50% stenosis at the petrous cavernous junction. The left middle cerebral artery demonstrates an angiographic occlusion in its mid M1 segment. The left anterior cerebral artery opacifies into the capillary and venous phases. PROCEDURE: The diagnostic JB 1 catheter in the left common carotid artery was exchanged over a 0.035 inch 300 cm Rosen exchange guidewire for an 8 [place] Pinnacle sheath in the right groin which was then connected to continuous heparinized saline infusion. Over the [facility] exchange guidewire, an 087 95 cm balloon guide catheter which had been prepped with 50% contrast and 50% heparinized saline infusion was advanced and positioned in the proximal 1/3 of the left internal carotid artery. The guidewire was removed. Good aspiration obtained from the hub of balloon guide catheter. A control arteriogram performed through this demonstrated no evidence of spasms, dissections or  intraluminal filling defects. No change was noted in the intracranial circulation. Over a 0.014 inch standard Synchro micro guidewire with a J configuration, the combination of an 021 Trevo ProVue microcatheter and a 6 French 132 cm Catalyst guide catheter was advanced without difficulty to the supraclinoid left ICA. The  micro guidewire was then gently manipulated with a torque device and advanced through the occluded left middle cerebral artery into the M2 M3 region of the dominant inferior division followed by the microcatheter. The guidewire was removed. Good aspiration obtained from the hub of the microcatheter. A gentle control arteriogram performed through the microcatheter demonstrated safe position of the tip of the microcatheter. This was then connected to continuous heparinized saline infusion. The 6 [place] Catalyst guide catheter was now advanced into the supraclinoid left ICA. A 4 mm x 40 mm Solitaire X retrieval device was then advanced to the distal end of the microcatheter and deployed by slight forward gentle traction with the right hand on the delivery micro guidewire with the left hand retrieving the delivery microcatheter deploying the retrieval device. The Catalyst guide catheter was advanced into left middle cerebral artery and imbedded in the proximal portion of the occlusion. Proximal flow arrest was then initiated by inflating the balloon in the left internal carotid artery. With constant aspiration being applied at the hub of the 6 [place] Catalyst guide catheter with a Penumbra aspiration device over 2 minutes, and with a 60 mL syringe at the hub of the balloon guide catheter, the combination of the retrieval device, the microcatheter and the 6 [place] Catalyst guide catheter were retrieved and removed. Proximal flow arrest was then reversed. A control arteriogram performed through the 087 balloon guide catheter in the left internal carotid artery demonstrated complete angiographic revascularization of the left middle and the left anterior cerebral arteries. A TICI 3 revascularization of the left MCA distribution was achieved. During the procedure, the patient's blood pressure and neurological status remained stable. No evidence of extravasation or mass-effect or midline shift was noted. The balloon guide  catheter was retrieved and removed. The 8 French Pinnacle sheath in the right groin was then replaced with an 8 French Angio-Seal closure device for hemostasis. Distal pulses in the dorsalis pedis, and the posterior tibial regions remained Dopplerable bilaterally unchanged from prior to the procedure. A flat panel CT of the brain demonstrated no evidence of spasms, dissections or of intraluminal filling defects. The patient was left intubated on account of her body habitus to maintain her airway. She was then transferred to the neuro ICU to continue with post thrombectomy management. IMPRESSION: Status post endovascular complete revascularization of the occluded left middle cerebral artery with 1 pass with the 4 mm x 40 mm Solitaire X retrieval device, and Penumbra aspiration achieving a TICI 3 revascularization. PLAN: Follow up in the clinic 4 weeks post discharge. Electronically Signed   By: Luanne Bras M.D.   On: 03/07/2019 16:38   CT HEAD CODE STROKE WO CONTRAST  Result Date: 03/07/2019 CLINICAL DATA:  Code stroke. Recent fall. Code stroke for new onset right hemiplegia and aphasia. EXAM: CT HEAD WITHOUT CONTRAST TECHNIQUE: Contiguous axial images were obtained from the base of the skull through the vertex without intravenous contrast. COMPARISON:  11/07/2016 FINDINGS: Brain: Acute subdural hemorrhage along the right cerebral convexity measuring up to 1 cm in thickness. Mild local mass effect with no midline shift. No acute infarct is seen on the left. Vascular: No hyperdense vessel or unexpected calcification. Skull: Normal. Negative  for fracture or focal lesion. Sinuses/Orbits: Negative Other: These results were called by telephone at the time of interpretation on 03/07/2019 at 7:58 am to provider [facility] , who verbally acknowledged these results. ASPECTS [facility] Stroke Program Early CT Score) - Ganglionic level infarction (caudate, lentiform nuclei, internal capsule, insula, M1-M3 cortex): 7  - Supraganglionic infarction (M4-M6 cortex): 3 Total score (0-10 with 10 being normal): 10 IMPRESSION: 1. Acute subdural hematoma along the right convexity measuring up to 1 cm in thickness. 2. Left MCA syndrome.  Left hemispheric ASPECTS is 10. Electronically Signed   By: Monte Fantasia M.D.   On: 03/07/2019 08:00   ECHOCARDIOGRAM LIMITED  Result Date: 03/07/2019    ECHOCARDIOGRAM LIMITED REPORT   Patient Name:   [name] Date of Exam: 03/07/2019 Medical Rec #:  [id number]      Height:       62.0 in Accession #:    [id number]     Weight:       242.5 lb Date of Birth:  December 09, 1945       BSA:          2.07 m Patient Age:    39 years       BP:           148/67 mmHg Patient Gender: F              HR:           65 bpm. Exam Location:  Inpatient Procedure: Limited Echo and Intracardiac Opacification Agent Indications:    Stroke 434.91 / I163.9  History:        Patient has prior history of Echocardiogram examinations, most                 recent 03/05/2019. CHF; Risk Factors:Hypertension, Dyslipidemia                 and Diabetes. Elevated troponin, chronic kidney disease, chronic                 anemia.  Sonographer:    Darlina Sicilian RDCS Referring Phys: [id number] Lanice Schwab AROOR  Sonographer Comments: Per Dr. Lorraine Lax look for LV thrombus IMPRESSIONS  1. Left ventricular ejection fraction, by estimation, is 65 to 70%. The left ventricle has normal function. The left ventrical has no regional wall motion abnormalities. Left ventricular diastolic function could not be evaluated. LV filling pressures not assessed. No evidence of LV apical thrombus by definity contrast.  2. Right ventricular systolic function was not well visualized. The right ventricular size is not well visualized.  3. The mitral valve is normal in structure and function.  4. The aortic valve was not assessed. FINDINGS  Left Ventricle: Left ventricular ejection fraction, by estimation, is 65 to 70%. The left ventricle has normal function. The left  ventricle has no regional wall motion abnormalities. The left ventricular internal cavity size was normal in size. There is  no left ventricular hypertrophy. LV filling pressures not assessed. o evidence of LV apical thrombus by definity contrast Right Ventricle: The right ventricular size is not well visualized. Right vetricular wall thickness was not assessed. Right ventricular systolic function was not well visualized. Left Atrium: Left atrial size was not assessed. Right Atrium: Right atrial size was not assessed. Pericardium: Trivial pericardial effusion is present. The pericardial effusion is posterior to the left ventricle. Mitral Valve: The mitral valve is normal in structure and function. Normal mobility of the mitral valve leaflets. Tricuspid Valve: The  tricuspid valve is not assessed. Aortic Valve: The aortic valve was not assessed. Pulmonic Valve: The pulmonic valve was not assessed. Aorta: Aortic root could not be assessed. Venous: The inferior vena cava was not well visualized. The inferior vena cava and the hepatic vein show a normal flow pattern. IAS/Shunts: No atrial level shunt detected by color flow Doppler. Fransico Him MD Electronically signed by Fransico Him MD Signature Date/Time: 03/07/2019/11:26:44 AM    Final     Cardiac Studies   TTE 03/07/19 1. Left ventricular ejection fraction, by estimation, is 65 to 70%. The  left ventricle has normal function. The left ventrical has no regional  wall motion abnormalities. Left ventricular diastolic function could not  be evaluated. LV filling pressures  not assessed. No evidence of LV apical thrombus by definity contrast.  2. Right ventricular systolic function was not well visualized. The right  ventricular size is not well visualized.  3. The mitral valve is normal in structure and function.  4. The aortic valve was not assessed.   Patient Profile     74 y.o. female with hx of HFpEF, obesity, HTN, HLD, DM, CKD stage IV,  bradycardia who was admitted for heart failure exacerbation. Hospital course complicated by acute left ischemic stroke secondary to M1 occlusion. She underwent successful mechanical thrombectomy on 03/07/19  Assessment & Plan    Acute CVA 2/2 M1 occlusion s/p mechanical thrombectomy Right-sided subdural hematoma  -neurology managing -unclear etiology; repeat echo without evidence of LV thrombus; no DVT in her lower extremities.  No AF on telemetry  -on aspirin 81 mg, as she is not a candidate for DAPT given her SDH  Acute on chronic diastolic HF -had 1L output yesterday, but is 3 kg up after requiring IVF for contrast and hypotension  -continuing PO lasix 40 daily  -continue strict I&Os; daily weights  CKD IV -Cr 3.3->3.2->2.5->3.3 -monitoring closely with recent IV contrast  -continuing Lasix as above   HTN -home meds on hold -BP goal <160 with SDH    Junctional rhythm: On telemetry, appears junctional  with PACs.  EKG confirms junctional rhythm with PACs.  Concerned that this may be 2/2 hyperkalemia, as K+ 6.0 this morning.  Will give IV calcium gluconate 1g and discussed with primary team for further treatment of hyperkalemia  For questions or updates, please contact [place] Please consult [email or web address] for contact info under      Signed, Delice Bison, DO  03/09/2019, 7:49 AM     Patient seen and examined.  Agree with above documentation. On exam, patient is alert and oriented, regular rate, irregular rhythm, no murmurs, diminished breath sounds, no LE edema, no JVD appreciated but difficult to assess given habitus.  On telemetry, appears to be in junctional rhythm with PACs.  EKG confirms junctional rhythm with PACs.  This is a change in her rhythm from prior.  Concerned this may be related to her hyperkalemia, as potassium 6.0 this morning.  Will give IV calcium gluconate.  I did speak with Dr. Erlinda Hong concerning the hyperkalemia, and he will order further treatment.   Creatinine is elevated today (2.5->3.3), though I do wonder if yesterday's lab values were spurious, as creatinine today is more similar to previous levels.  Will need to continue to monitor renal function closely given contrast load on 2/10.  Donato Heinz, MD

## 2019-03-09 NOTE — Progress Notes (Signed)
Tele called to report CV strip showing Afib. Provider notified.

## 2019-03-09 NOTE — Progress Notes (Signed)
Patient arrived in the unit at 0705 am, A&Ox3, denied of pain, resting in a bed at this moment, given report to day shift RN Reatha Armour.

## 2019-03-09 NOTE — Progress Notes (Signed)
CPAP is a PRN order, no distress noted, patient on RA at 96%.  Will continue to monitor.

## 2019-03-09 NOTE — Progress Notes (Signed)
CPAP brought to pt's room for her to wear per order. Pt states at this time, she wants to wait and wear it tomorrow night. Currently, pt's HR 64, RR 17, 97% on RA.  Advised pt to notify for RT if she changes her mind.

## 2019-03-09 NOTE — Progress Notes (Signed)
STROKE TEAM PROGRESS NOTE   INTERVAL HISTORY Pt sitting in bed, awake alert, no complains, no acute event overnight. However, her Cre elevated and K 6.0 today. Her EKG also showed junctional rhythm, concerning 2/2 hyperkalemia. Gave D50 and insulin with kayexalate, will have IM on board.   Vitals:   03/09/19 0500 03/09/19 0600 03/09/19 0714 03/09/19 0800  BP: 105/69 116/81 102/73   Pulse: (!) 58 64 (!) 41 96  Resp: 17 18 20    Temp:    99.2 F (37.3 C)  TempSrc:    Oral  SpO2: 94% 94% 97%   Weight: 113 kg     Height:        CBC:  Recent Labs  Lab 03/04/19 0954 03/04/19 1012 03/06/19 0853 03/07/19 1228 03/07/19 1602 03/08/19 0545  WBC 6.5   < > 4.6  --   --  7.4  NEUTROABS 5.3  --   --   --   --  6.3  HGB 9.7*   < > 9.3*   < > 8.8* 8.4*  HCT 29.4*   < > 28.0*   < > 26.0* 25.5*  MCV 94.2   < > 94.6  --   --  95.1  PLT 185   < > 205  --   --  198   < > = values in this interval not displayed.    Basic Metabolic Panel:  Recent Labs  Lab 03/08/19 0545 03/09/19 0854  NA 137 137  K 4.2 6.0*  CL 105 111  CO2 20* 12*  GLUCOSE 162* 121*  BUN 42* 50*  CREATININE 2.51* 3.27*  CALCIUM 8.2* 8.2*   Lipid Panel:     Component Value Date/Time   CHOL 123 03/08/2019 0545   TRIG 121 03/08/2019 0545   HDL 44 03/08/2019 0545   CHOLHDL 2.8 03/08/2019 0545   VLDL 24 03/08/2019 0545   LDLCALC 55 03/08/2019 0545   HgbA1c:  Lab Results  Component Value Date   HGBA1C 5.6 03/08/2019   Urine Drug Screen:     Component Value Date/Time   LABOPIA NONE DETECTED 09/25/2012 1440   COCAINSCRNUR NONE DETECTED 09/25/2012 1440   LABBENZ NONE DETECTED 09/25/2012 1440   AMPHETMU NONE DETECTED 09/25/2012 1440   THCU NONE DETECTED 09/25/2012 1440   LABBARB NONE DETECTED 09/25/2012 1440    Alcohol Level No results found for: ETH  IMAGING past 48 hours MR ANGIO HEAD WO CONTRAST  Addendum Date: 03/08/2019   ADDENDUM REPORT: 03/08/2019 16:05 ADDENDUM: Please note that several sequences  from a different patient's examination were initially included in this patient's brain MRI in PACS. This error has reportedly been resolved. The interpretation is largely unchanged. However, upon further review there are additional punctate acute infarcts noted within the left caudate nucleus (series 2, image 27) and left lentiform nucleus (series 2, image 26). Addendum discussed with Dr. Erlinda Hong by telephone at 4 p.m. on 03/08/2019. Electronically Signed   By: Kellie Simmering DO   On: 03/08/2019 16:05   Result Date: 03/08/2019 CLINICAL DATA:  Stroke, follow-up. EXAM: MRI HEAD WITHOUT CONTRAST MRA HEAD WITHOUT CONTRAST TECHNIQUE: Multiplanar, multiecho pulse sequences of the brain and surrounding structures were obtained without intravenous contrast. Angiographic images of the head were obtained using MRA technique without contrast. COMPARISON:  Noncontrast CT head and CT angiogram head/neck 03/07/2019, report from catheter based angiography performed 03/07/2019 FINDINGS: MRI HEAD FINDINGS Brain: The diffusion-weighted imaging is of good quality. The remainder of the examination is significantly motion  degraded. Most notably there is moderate moderate/severe motion degradation of the sagittal T1 FLAIR sequence, moderate/severe motion degradation of the axial T2 weighted sequence, severe motion degradation of the axial T2 FLAIR sequence, severe motion degradation of the coronal T2 weighted sequence, severe motion degradation of the axial GRE sequence and moderate motion degradation of the axial T1 weighted sequence. There are only two subcentimeter cortically based acute infarcts within the left parietal lobe. No other acute infarct is identified. Within the limitations of motion degradation, an acute right cerebral convexity subdural hematoma is unchanged in size, measuring 10 mm in thickness. Only minimal mass effect upon the underlying right cerebral hemisphere. No midline shift. Previously demonstrated mild chronic  small vessel ischemic disease within the cerebral hemispheres and pons is suboptimally assessed. Mild generalized parenchymal atrophy. Vascular: Reported separately. Skull and upper cervical spine: Within limitations of motion degradation, no focal marrow lesion is identified. Incompletely assessed upper cervical spondylosis. Sinuses/Orbits: Visualized orbits demonstrate no acute abnormality. Mild scattered paranasal sinus mucosal thickening. No significant mastoid effusion. MRA HEAD FINDINGS The examination is moderately motion degraded, limiting evaluation. The intracranial internal carotid arteries are patent bilaterally. Redemonstrated moderate stenosis within the bilateral cavernous segments. The M1 left middle cerebral artery is now patent without significant stenosis. No left M2 proximal branch occlusion or high-grade proximal stenosis is identified. The right middle and bilateral anterior cerebral arteries are patent without high-grade proximal stenosis. No intracranial aneurysm is identified. The intracranial vertebral arteries are patent bilaterally. Sites of up to moderate stenosis within the V4 right vertebral artery. No significant stenosis within the intracranial left vertebral artery. The basilar artery is patent without significant stenosis. The bilateral posterior cerebral arteries are patent without significant proximal stenosis. Posterior communicating arteries are poorly delineated and may be hypoplastic or absent. IMPRESSION: MRI brain: 1. The diffusion-weighted imaging is of good quality. Two subcentimeter acute cortical infarcts are present within the left parietal lobe. No other acute infarct. 2. The remainder of the examination is significantly motion degraded and limited as described. 3. Unchanged acute right cerebral convexity subdural hematoma. 4. Mild generalized parenchymal atrophy and chronic small vessel ischemic disease. MRA head: 1. Motion degraded exam. 2. Interval restoration of  flow within the M1 left middle cerebral artery which is now patent without significant stenosis. 3. Redemonstrated sites of moderate atherosclerotic narrowing within the cavernous ICAs bilaterally. 4. Redemonstrated sites of up to moderate stenosis within the V4 right vertebral artery. Electronically Signed: By: Kellie Simmering DO On: 03/08/2019 14:06   MR BRAIN WO CONTRAST  Addendum Date: 03/08/2019   ADDENDUM REPORT: 03/08/2019 16:05 ADDENDUM: Please note that several sequences from a different patient's examination were initially included in this patient's brain MRI in PACS. This error has reportedly been resolved. The interpretation is largely unchanged. However, upon further review there are additional punctate acute infarcts noted within the left caudate nucleus (series 2, image 27) and left lentiform nucleus (series 2, image 26). Addendum discussed with Dr. Erlinda Hong by telephone at 4 p.m. on 03/08/2019. Electronically Signed   By: Kellie Simmering DO   On: 03/08/2019 16:05   Result Date: 03/08/2019 CLINICAL DATA:  Stroke, follow-up. EXAM: MRI HEAD WITHOUT CONTRAST MRA HEAD WITHOUT CONTRAST TECHNIQUE: Multiplanar, multiecho pulse sequences of the brain and surrounding structures were obtained without intravenous contrast. Angiographic images of the head were obtained using MRA technique without contrast. COMPARISON:  Noncontrast CT head and CT angiogram head/neck 03/07/2019, report from catheter based angiography performed 03/07/2019 FINDINGS: MRI HEAD FINDINGS Brain:  The diffusion-weighted imaging is of good quality. The remainder of the examination is significantly motion degraded. Most notably there is moderate moderate/severe motion degradation of the sagittal T1 FLAIR sequence, moderate/severe motion degradation of the axial T2 weighted sequence, severe motion degradation of the axial T2 FLAIR sequence, severe motion degradation of the coronal T2 weighted sequence, severe motion degradation of the axial GRE  sequence and moderate motion degradation of the axial T1 weighted sequence. There are only two subcentimeter cortically based acute infarcts within the left parietal lobe. No other acute infarct is identified. Within the limitations of motion degradation, an acute right cerebral convexity subdural hematoma is unchanged in size, measuring 10 mm in thickness. Only minimal mass effect upon the underlying right cerebral hemisphere. No midline shift. Previously demonstrated mild chronic small vessel ischemic disease within the cerebral hemispheres and pons is suboptimally assessed. Mild generalized parenchymal atrophy. Vascular: Reported separately. Skull and upper cervical spine: Within limitations of motion degradation, no focal marrow lesion is identified. Incompletely assessed upper cervical spondylosis. Sinuses/Orbits: Visualized orbits demonstrate no acute abnormality. Mild scattered paranasal sinus mucosal thickening. No significant mastoid effusion. MRA HEAD FINDINGS The examination is moderately motion degraded, limiting evaluation. The intracranial internal carotid arteries are patent bilaterally. Redemonstrated moderate stenosis within the bilateral cavernous segments. The M1 left middle cerebral artery is now patent without significant stenosis. No left M2 proximal branch occlusion or high-grade proximal stenosis is identified. The right middle and bilateral anterior cerebral arteries are patent without high-grade proximal stenosis. No intracranial aneurysm is identified. The intracranial vertebral arteries are patent bilaterally. Sites of up to moderate stenosis within the V4 right vertebral artery. No significant stenosis within the intracranial left vertebral artery. The basilar artery is patent without significant stenosis. The bilateral posterior cerebral arteries are patent without significant proximal stenosis. Posterior communicating arteries are poorly delineated and may be hypoplastic or absent.  IMPRESSION: MRI brain: 1. The diffusion-weighted imaging is of good quality. Two subcentimeter acute cortical infarcts are present within the left parietal lobe. No other acute infarct. 2. The remainder of the examination is significantly motion degraded and limited as described. 3. Unchanged acute right cerebral convexity subdural hematoma. 4. Mild generalized parenchymal atrophy and chronic small vessel ischemic disease. MRA head: 1. Motion degraded exam. 2. Interval restoration of flow within the M1 left middle cerebral artery which is now patent without significant stenosis. 3. Redemonstrated sites of moderate atherosclerotic narrowing within the cavernous ICAs bilaterally. 4. Redemonstrated sites of up to moderate stenosis within the V4 right vertebral artery. Electronically Signed: By: Kellie Simmering DO On: 03/08/2019 14:06   DG CHEST PORT 1 VIEW  Result Date: 03/07/2019 CLINICAL DATA:  Endotracheal tube placement. EXAM: PORTABLE CHEST 1 VIEW COMPARISON:  March 04, 2019. FINDINGS: Stable cardiomegaly. Endotracheal and nasogastric tubes appear to be in good position. No pneumothorax or pleural effusion is noted. No acute pulmonary disease is noted. Bony thorax is unremarkable. IMPRESSION: Endotracheal and nasogastric tubes in good position. No acute cardiopulmonary abnormality seen. Electronically Signed   By: Marijo Conception M.D.   On: 03/07/2019 11:34   DG Abd Portable 1V  Result Date: 03/07/2019 CLINICAL DATA:  Orogastric tube placement. EXAM: PORTABLE ABDOMEN - 1 VIEW COMPARISON:  December 12, 2014. FINDINGS: The bowel gas pattern is normal. Distal tip of enteric tube is seen in proximal stomach. No radio-opaque calculi or other significant radiographic abnormality are seen. IMPRESSION: Distal tip of enteric tube seen in proximal stomach. No evidence of bowel obstruction or ileus.  Electronically Signed   By: Marijo Conception M.D.   On: 03/07/2019 12:44   ECHOCARDIOGRAM LIMITED  Result Date:  03/07/2019    ECHOCARDIOGRAM LIMITED REPORT   Patient Name:   YUNUEN MORDAN Date of Exam: 03/07/2019 Medical Rec #:  [id number]      Height:       62.0 in Accession #:    [id number]     Weight:       242.5 lb Date of Birth:  1945/08/29       BSA:          2.07 m Patient Age:    67 years       BP:           148/67 mmHg Patient Gender: F              HR:           65 bpm. Exam Location:  Inpatient Procedure: Limited Echo and Intracardiac Opacification Agent Indications:    Stroke 434.91 / I163.9  History:        Patient has prior history of Echocardiogram examinations, most                 recent 03/05/2019. CHF; Risk Factors:Hypertension, Dyslipidemia                 and Diabetes. Elevated troponin, chronic kidney disease, chronic                 anemia.  Sonographer:    Darlina Sicilian RDCS Referring Phys: [id number] Lanice Schwab AROOR  Sonographer Comments: Per Dr. Lorraine Lax look for LV thrombus IMPRESSIONS  1. Left ventricular ejection fraction, by estimation, is 65 to 70%. The left ventricle has normal function. The left ventrical has no regional wall motion abnormalities. Left ventricular diastolic function could not be evaluated. LV filling pressures not assessed. No evidence of LV apical thrombus by definity contrast.  2. Right ventricular systolic function was not well visualized. The right ventricular size is not well visualized.  3. The mitral valve is normal in structure and function.  4. The aortic valve was not assessed. FINDINGS  Left Ventricle: Left ventricular ejection fraction, by estimation, is 65 to 70%. The left ventricle has normal function. The left ventricle has no regional wall motion abnormalities. The left ventricular internal cavity size was normal in size. There is  no left ventricular hypertrophy. LV filling pressures not assessed. o evidence of LV apical thrombus by definity contrast Right Ventricle: The right ventricular size is not well visualized. Right vetricular wall thickness was not assessed.  Right ventricular systolic function was not well visualized. Left Atrium: Left atrial size was not assessed. Right Atrium: Right atrial size was not assessed. Pericardium: Trivial pericardial effusion is present. The pericardial effusion is posterior to the left ventricle. Mitral Valve: The mitral valve is normal in structure and function. Normal mobility of the mitral valve leaflets. Tricuspid Valve: The tricuspid valve is not assessed. Aortic Valve: The aortic valve was not assessed. Pulmonic Valve: The pulmonic valve was not assessed. Aorta: Aortic root could not be assessed. Venous: The inferior vena cava was not well visualized. The inferior vena cava and the hepatic vein show a normal flow pattern. IAS/Shunts: No atrial level shunt detected by color flow Doppler. Fransico Him MD Electronically signed by Fransico Him MD Signature Date/Time: 03/07/2019/11:26:44 AM    Final    Cerebral Angio 03/07/2019 S/P Lt common carotid arteriogram followed by complete revascularization  Of occluded Lt LMCA M 1 seg with x 1 pass woith 59mmx 40 mm  Solitaire X retriever device with penumbra aspiration  Achieving a TICI 3 revascularization.   PHYSICAL EXAM  Temp:  [98.1 F (36.7 C)-99.3 F (37.4 C)] 99.2 F (37.3 C) (02/12 0800) Pulse Rate:  [25-96] 96 (02/12 0800) Resp:  [16-21] 20 (02/12 0714) BP: (78-161)/(32-96) 102/73 (02/12 0714) SpO2:  [91 %-100 %] 97 % (02/12 0714) Weight:  [829 kg] 113 kg (02/12 0500)  General - Well nourished, well developed, in no apparent distress.  Ophthalmologic - fundi not visualized due to noncooperation.  Cardiovascular - Regular rhythm and rate.  Mental Status -  Level of arousal and orientation to time, place, and person were intact. Language including expression, naming, repetition, comprehension was assessed and found intact. Mild dysarthria due to poor denture  Cranial Nerves II - XII - II - Visual field intact OU. III, IV, VI - Extraocular movements intact. V  - Facial sensation intact bilaterally. VII - slight left nasolabial fold flattening. VIII - Hearing & vestibular intact bilaterally. X - Palate elevates symmetrically. XI - Chin turning & shoulder shrug intact bilaterally. XII - Tongue protrusion intact.  Motor Strength - The patient's strength was symmetric in all extremities and pronator drift was absent.  Bulk was normal and fasciculations were absent.   Motor Tone - Muscle tone was assessed at the neck and appendages and was normal.  Reflexes - The patient's reflexes were symmetrical in all extremities and she had no pathological reflexes.  Sensory - Light touch, temperature/pinprick were assessed and were symmetric    Coordination - The patient had normal movements in the hands with no ataxia or dysmetria.  Tremor was absent.  Gait and Station - deferred.   ASSESSMENT/PLAN Ms. KIERRA JEZEWSKI is a 74 y.o. female with history of chronic diastolic heart failure, diabetes mellitus, hypertension, CKD stage IV, morbid obesity, obstructive sleep apnea, first-degree AV block admitted to [facility] cardiology service for acute on chronic heart failure 2 days ago on 03/04/19 who in hospital developed aphasia, R hemiparesis and L gaze. Found to have a R SDH from falls PTA so no tPA, and a L M1 occlusion for which she was taken to IR.  Stroke:   L MCA infarct s/p IR w/ TICI3 revascularization L M1, infarct embolic, source unclear  CT head R SDH. ASPECTS 10.     CTA head & neck ELVO L M1. Cervical ICA atherosclerosis. Moderate R V4 narrowing.   Cerebral angio L M1 occlusion w/ TICI3 revascularization   Post IR CT with no new ICH or mass effect  MRI 2 small L parietal, L  caudate nucleus and L lentiform nucleus infarcts. SDH stable.   MRA  Restored flow L M1. B cavernous ICA atherosclerosis. R V4 moderate stenosis.   LE Doppler 2/7 neg for DVT   2D Echo EF 65-70%. No source of embolus   LDL 55  HgbA1c 5.6  SCDs for VTE  prophylaxis  aspirin 81 mg daily prior to admission, now on ASA 81mg . Not candidate for DAPT or anticoagulation at this time due to right SDH  Therapy recommendations:  SNF  Disposition:  pending   Currently not an AC candidate. Will consider loop recorder at time of f/u once ok'd for [facility].   Follow up Dr. Leonie Man in 3-4 weeks, order placed  Right SDH  Likely due to falls at home PTA  CT showed right SDH without midline shift, mild  mass effect  MRI 2 small L parietal, L  caudate nucleus and L lentiform nucleus infarcts. SDH stable.   Resumed ASA 81 as pt has been on ASA for several days during admission  No DAPT or AC at this time.  Follow up Dr. Leonie Man in 3-4 weeks to repeat CT head  CKD stage IV Hyperkalemia   Cre 3.53->3.17-> 2.51->3.27   K 4.2->6.0->pending   Junctional rhythm, likely d/t hyperkalemia  IV Ca gluconate, IV insulin and D50  Po kayexalate  IM consult - appreciate help  Nephrology on board  Acute on chronic diastolic Congestive heart failure (HFpEF) Elevated troponin  Troponin has been flat 400s  Weight up today after fluid boluses yesterday  Remains on On lasix  off IVF  strict I&Os, daily wts  Cardiology following  Hypertension  Home meds amlodipine 5, hydralazine 25 tid, lasix 40  Home meds on hold x lasix  BP dropped after Cleviprex post IR  Fluid bolus 2/11 for BP 100-110s . BP goal < 160 due to SDH . Long-term BP goal normotensive  Hyperlipidemia  Home meds:  vytorin (ezetimibe 10/simvastatin 40), resumed in hospital  LDL 55, goal < 70  Will not use intensive statin given appropriate LDL goal    Continue statin at discharge  Diabetes type II Controlled  Home meds:  Glipizide 5, humulin 70/30 5u bid  HgbA1c 5.6, goal < 7.0  CBG monitoring  SSI  PCP follow up  Hx Seizures  On tegretol 400 bid  Following with Dr. Jaynee Eagles at [facility]  Dysphagia . Secondary to stroke . On dys2 and thin liquid . Speech on  board   Other Stroke Risk Factors  Advanced age  Morbid Obesity, Body mass index is 45.56 kg/m., recommend weight loss, diet and exercise as appropriate   Coronary artery disease  Obstructive sleep apnea, CPAP status unknown   Other Active Problems    Hospital day # 4  Neurology will sign off. Please call with questions. Pt will follow up with stroke clinic Dr. Leonie Man at [facility] in about 4 weeks. Thanks for the consult.   Rosalin Hawking, MD PhD Stroke Neurology 03/09/2019 10:24 AM   To contact Stroke Continuity provider, please refer to [email or web address] After hours, contact General Neurology

## 2019-03-09 NOTE — Progress Notes (Signed)
Tele called to report pt's HR dropped to 38. HR is sustaining in the mid to high 40s. Pt assessed and asymptomatic. Provider notified.

## 2019-03-10 LAB — CBC WITH DIFFERENTIAL/PLATELET
Abs Immature Granulocytes: 0.03 10*3/uL (ref 0.00–0.07)
Basophils Absolute: 0 10*3/uL (ref 0.0–0.1)
Basophils Relative: 0 %
Eosinophils Absolute: 0 10*3/uL (ref 0.0–0.5)
Eosinophils Relative: 0 %
HCT: 26.4 % — ABNORMAL LOW (ref 36.0–46.0)
Hemoglobin: 8.9 g/dL — ABNORMAL LOW (ref 12.0–15.0)
Immature Granulocytes: 1 %
Lymphocytes Relative: 19 %
Lymphs Abs: 1.1 10*3/uL (ref 0.7–4.0)
MCH: 32.2 pg (ref 26.0–34.0)
MCHC: 33.7 g/dL (ref 30.0–36.0)
MCV: 95.7 fL (ref 80.0–100.0)
Monocytes Absolute: 0.9 10*3/uL (ref 0.1–1.0)
Monocytes Relative: 14 %
Neutro Abs: 4.1 10*3/uL (ref 1.7–7.7)
Neutrophils Relative %: 66 %
Platelets: 182 10*3/uL (ref 150–400)
RBC: 2.76 MIL/uL — ABNORMAL LOW (ref 3.87–5.11)
RDW: 13.2 % (ref 11.5–15.5)
WBC: 6.2 10*3/uL (ref 4.0–10.5)
nRBC: 0 % (ref 0.0–0.2)

## 2019-03-10 LAB — RENAL FUNCTION PANEL
Albumin: 2.9 g/dL — ABNORMAL LOW (ref 3.5–5.0)
Anion gap: 14 (ref 5–15)
BUN: 51 mg/dL — ABNORMAL HIGH (ref 8–23)
CO2: 20 mmol/L — ABNORMAL LOW (ref 22–32)
Calcium: 8.5 mg/dL — ABNORMAL LOW (ref 8.9–10.3)
Chloride: 105 mmol/L (ref 98–111)
Creatinine, Ser: 4 mg/dL — ABNORMAL HIGH (ref 0.44–1.00)
GFR calc Af Amer: 12 mL/min — ABNORMAL LOW (ref >60)
GFR calc non Af Amer: 11 mL/min — ABNORMAL LOW (ref >60)
Glucose, Bld: 163 mg/dL — ABNORMAL HIGH (ref 70–99)
Phosphorus: 6.3 mg/dL — ABNORMAL HIGH (ref 2.5–4.6)
Potassium: 4.1 mmol/L (ref 3.5–5.1)
Sodium: 139 mmol/L (ref 135–145)

## 2019-03-10 LAB — GLUCOSE, CAPILLARY
Glucose-Capillary: 154 mg/dL — ABNORMAL HIGH (ref 70–99)
Glucose-Capillary: 160 mg/dL — ABNORMAL HIGH (ref 70–99)
Glucose-Capillary: 179 mg/dL — ABNORMAL HIGH (ref 70–99)
Glucose-Capillary: 237 mg/dL — ABNORMAL HIGH (ref 70–99)

## 2019-03-10 NOTE — Progress Notes (Signed)
1900Pt voided this afternoon. x2 assist to get her up to [place] to pt for future needs Placed Bladder Scanner in room for next shift Reported to them she needs close monitoring of output tonight

## 2019-03-10 NOTE — Progress Notes (Signed)
Patient refuse CPAP for the night, RN stating also that she does not wear one at home. Patient does not want a CPAP tonight. Patient is stable at this time

## 2019-03-10 NOTE — Progress Notes (Signed)
Patient ID: [name], female   DOB: 02-Mar-1945, 74 y.o.   MRN: [id number] [facility] Progress Note   Assessment/ Plan:   1.    Acute kidney injury on chronic kidney disease stage IV: She appears to have had possibly hemodynamically mediated worsening of renal function with relative hypotension in the setting of recent contrast exposure.    Creatinine slightly higher today without any acute electrolyte abnormalities to prompt intervention.  I will continue to monitor her closely to decide on additional management including adjustment of diuretic therapy if she has worsening respiratory symptoms from hypervolemia.  No indications for dialysis. 2.  Metabolic acidosis:  Secondary to acute kidney injury/hypotension and improving on sodium bicarbonate. 3.  Hyperkalemia: Corrected overnight with Kayexalate/Lokelma/sodium bicarbonate 4.  Left MCA CVA status post cerebral angiogram with thrombectomy/revascularization by IR.  Not a candidate for anticoagulation due to associated subdural hematoma. 5.  Acute exacerbation of diastolic heart failure: With some mild shortness of breath-she reports that this is comparable to yesterday and for which we will continue furosemide 40 mg daily. 6.  Hypertension: Blood pressures remain intermittently low off scheduled antihypertensive therapy-she is on as needed hydralazine and furosemide 40 mg daily.  Subjective:   Reports some mild shortness of breath this morning, overnight telemetry events noted.  She denies chest pain.   Objective:   BP (!) 98/51 (BP Location: Left Wrist)   Pulse (!) 50   Temp 98.4 F (36.9 C) (Oral)   Resp 15   Ht 5\' 2"  (1.575 m)   Wt 110 kg   SpO2 91%   BMI 44.35 kg/m   Intake/Output Summary (Last 24 hours) at 03/10/2019 2585 Last data filed at 03/09/2019 1807 Gross per 24 hour  Intake 50 ml  Output --  Net 50 ml   Weight change: -3 kg  Physical Exam: Gen: Comfortably resting in bed, watching television CVS:  Pulse regular bradycardia, S1 and S2 normal Resp: Fine rales left base otherwise clear to auscultation. Abd: Soft, obese, nontender Ext: Venous stasis changes with trace lower extremity edema  Imaging: MR ANGIO HEAD WO CONTRAST  Addendum Date: 03/08/2019   ADDENDUM REPORT: 03/08/2019 16:05 ADDENDUM: Please note that several sequences from a different patient's examination were initially included in this patient's brain MRI in PACS. This error has reportedly been resolved. The interpretation is largely unchanged. However, upon further review there are additional punctate acute infarcts noted within the left caudate nucleus (series 2, image 27) and left lentiform nucleus (series 2, image 26). Addendum discussed with Dr. Erlinda Hong by telephone at 4 p.m. on 03/08/2019. Electronically Signed   By: Kellie Simmering DO   On: 03/08/2019 16:05   Result Date: 03/08/2019 CLINICAL DATA:  Stroke, follow-up. EXAM: MRI HEAD WITHOUT CONTRAST MRA HEAD WITHOUT CONTRAST TECHNIQUE: Multiplanar, multiecho pulse sequences of the brain and surrounding structures were obtained without intravenous contrast. Angiographic images of the head were obtained using MRA technique without contrast. COMPARISON:  Noncontrast CT head and CT angiogram head/neck 03/07/2019, report from catheter based angiography performed 03/07/2019 FINDINGS: MRI HEAD FINDINGS Brain: The diffusion-weighted imaging is of good quality. The remainder of the examination is significantly motion degraded. Most notably there is moderate moderate/severe motion degradation of the sagittal T1 FLAIR sequence, moderate/severe motion degradation of the axial T2 weighted sequence, severe motion degradation of the axial T2 FLAIR sequence, severe motion degradation of the coronal T2 weighted sequence, severe motion degradation of the axial GRE sequence and moderate motion degradation of the  axial T1 weighted sequence. There are only two subcentimeter cortically based acute infarcts  within the left parietal lobe. No other acute infarct is identified. Within the limitations of motion degradation, an acute right cerebral convexity subdural hematoma is unchanged in size, measuring 10 mm in thickness. Only minimal mass effect upon the underlying right cerebral hemisphere. No midline shift. Previously demonstrated mild chronic small vessel ischemic disease within the cerebral hemispheres and pons is suboptimally assessed. Mild generalized parenchymal atrophy. Vascular: Reported separately. Skull and upper cervical spine: Within limitations of motion degradation, no focal marrow lesion is identified. Incompletely assessed upper cervical spondylosis. Sinuses/Orbits: Visualized orbits demonstrate no acute abnormality. Mild scattered paranasal sinus mucosal thickening. No significant mastoid effusion. MRA HEAD FINDINGS The examination is moderately motion degraded, limiting evaluation. The intracranial internal carotid arteries are patent bilaterally. Redemonstrated moderate stenosis within the bilateral cavernous segments. The M1 left middle cerebral artery is now patent without significant stenosis. No left M2 proximal branch occlusion or high-grade proximal stenosis is identified. The right middle and bilateral anterior cerebral arteries are patent without high-grade proximal stenosis. No intracranial aneurysm is identified. The intracranial vertebral arteries are patent bilaterally. Sites of up to moderate stenosis within the V4 right vertebral artery. No significant stenosis within the intracranial left vertebral artery. The basilar artery is patent without significant stenosis. The bilateral posterior cerebral arteries are patent without significant proximal stenosis. Posterior communicating arteries are poorly delineated and may be hypoplastic or absent. IMPRESSION: MRI brain: 1. The diffusion-weighted imaging is of good quality. Two subcentimeter acute cortical infarcts are present within the  left parietal lobe. No other acute infarct. 2. The remainder of the examination is significantly motion degraded and limited as described. 3. Unchanged acute right cerebral convexity subdural hematoma. 4. Mild generalized parenchymal atrophy and chronic small vessel ischemic disease. MRA head: 1. Motion degraded exam. 2. Interval restoration of flow within the M1 left middle cerebral artery which is now patent without significant stenosis. 3. Redemonstrated sites of moderate atherosclerotic narrowing within the cavernous ICAs bilaterally. 4. Redemonstrated sites of up to moderate stenosis within the V4 right vertebral artery. Electronically Signed: By: Kellie Simmering DO On: 03/08/2019 14:06   MR BRAIN WO CONTRAST  Addendum Date: 03/08/2019   ADDENDUM REPORT: 03/08/2019 16:05 ADDENDUM: Please note that several sequences from a different patient's examination were initially included in this patient's brain MRI in PACS. This error has reportedly been resolved. The interpretation is largely unchanged. However, upon further review there are additional punctate acute infarcts noted within the left caudate nucleus (series 2, image 27) and left lentiform nucleus (series 2, image 26). Addendum discussed with Dr. Erlinda Hong by telephone at 4 p.m. on 03/08/2019. Electronically Signed   By: Kellie Simmering DO   On: 03/08/2019 16:05   Result Date: 03/08/2019 CLINICAL DATA:  Stroke, follow-up. EXAM: MRI HEAD WITHOUT CONTRAST MRA HEAD WITHOUT CONTRAST TECHNIQUE: Multiplanar, multiecho pulse sequences of the brain and surrounding structures were obtained without intravenous contrast. Angiographic images of the head were obtained using MRA technique without contrast. COMPARISON:  Noncontrast CT head and CT angiogram head/neck 03/07/2019, report from catheter based angiography performed 03/07/2019 FINDINGS: MRI HEAD FINDINGS Brain: The diffusion-weighted imaging is of good quality. The remainder of the examination is significantly motion  degraded. Most notably there is moderate moderate/severe motion degradation of the sagittal T1 FLAIR sequence, moderate/severe motion degradation of the axial T2 weighted sequence, severe motion degradation of the axial T2 FLAIR sequence, severe motion degradation of the coronal T2  weighted sequence, severe motion degradation of the axial GRE sequence and moderate motion degradation of the axial T1 weighted sequence. There are only two subcentimeter cortically based acute infarcts within the left parietal lobe. No other acute infarct is identified. Within the limitations of motion degradation, an acute right cerebral convexity subdural hematoma is unchanged in size, measuring 10 mm in thickness. Only minimal mass effect upon the underlying right cerebral hemisphere. No midline shift. Previously demonstrated mild chronic small vessel ischemic disease within the cerebral hemispheres and pons is suboptimally assessed. Mild generalized parenchymal atrophy. Vascular: Reported separately. Skull and upper cervical spine: Within limitations of motion degradation, no focal marrow lesion is identified. Incompletely assessed upper cervical spondylosis. Sinuses/Orbits: Visualized orbits demonstrate no acute abnormality. Mild scattered paranasal sinus mucosal thickening. No significant mastoid effusion. MRA HEAD FINDINGS The examination is moderately motion degraded, limiting evaluation. The intracranial internal carotid arteries are patent bilaterally. Redemonstrated moderate stenosis within the bilateral cavernous segments. The M1 left middle cerebral artery is now patent without significant stenosis. No left M2 proximal branch occlusion or high-grade proximal stenosis is identified. The right middle and bilateral anterior cerebral arteries are patent without high-grade proximal stenosis. No intracranial aneurysm is identified. The intracranial vertebral arteries are patent bilaterally. Sites of up to moderate stenosis within  the V4 right vertebral artery. No significant stenosis within the intracranial left vertebral artery. The basilar artery is patent without significant stenosis. The bilateral posterior cerebral arteries are patent without significant proximal stenosis. Posterior communicating arteries are poorly delineated and may be hypoplastic or absent. IMPRESSION: MRI brain: 1. The diffusion-weighted imaging is of good quality. Two subcentimeter acute cortical infarcts are present within the left parietal lobe. No other acute infarct. 2. The remainder of the examination is significantly motion degraded and limited as described. 3. Unchanged acute right cerebral convexity subdural hematoma. 4. Mild generalized parenchymal atrophy and chronic small vessel ischemic disease. MRA head: 1. Motion degraded exam. 2. Interval restoration of flow within the M1 left middle cerebral artery which is now patent without significant stenosis. 3. Redemonstrated sites of moderate atherosclerotic narrowing within the cavernous ICAs bilaterally. 4. Redemonstrated sites of up to moderate stenosis within the V4 right vertebral artery. Electronically Signed: By: Kellie Simmering DO On: 03/08/2019 14:06    Labs: BMET Recent Labs  Lab 03/06/19 1610 03/06/19 9604 03/07/19 0542 03/07/19 0542 03/07/19 1228 03/07/19 1602 03/08/19 0545 03/09/19 5409 03/09/19 1709 03/09/19 2246 03/10/19 0307  NA 136   < > 136   < > 136 135 137 137 139 139 139  K 4.5   < > 4.5   < > 4.2 4.2 4.2 6.0* 4.8 4.2 4.1  CL 101  --  104  --   --   --  105 111 111 106 105  CO2 25  --  23  --   --   --  20* 12* 14* 19* 20*  GLUCOSE 192*  --  125*  --   --   --  162* 121* 212* 209* 163*  BUN 56*  --  52*  --   --   --  42* 50* 51* 50* 51*  CREATININE 3.34*  --  3.17*  --   --   --  2.51* 3.27* 3.69* 3.90* 4.00*  CALCIUM 8.3*  --  8.4*  --   --   --  8.2* 8.2* 8.5* 8.2* 8.5*  PHOS  --   --   --   --   --   --   --   --  5.5*  --  6.3*   < > = values in this interval  not displayed.   CBC Recent Labs  Lab 03/04/19 0954 03/04/19 1012 03/04/19 1557 03/04/19 1557 03/06/19 0853 03/06/19 0853 03/07/19 1228 03/07/19 1602 03/08/19 0545 03/10/19 0307  WBC 6.5   < > 6.0  --  4.6  --   --   --  7.4 6.2  NEUTROABS 5.3  --   --   --   --   --   --   --  6.3 4.1  HGB 9.7*   < > 9.6*   < > 9.3*   < > 9.2* 8.8* 8.4* 8.9*  HCT 29.4*   < > 27.5*   < > 28.0*   < > 27.0* 26.0* 25.5* 26.4*  MCV 94.2   < > 90.5  --  94.6  --   --   --  95.1 95.7  PLT 185   < > 182  --  205  --   --   --  198 182   < > = values in this interval not displayed.    Medications:    .  stroke: mapping our early stages of recovery book   Does not apply Once  . aspirin EC  81 mg Oral Daily  . carbamazepine  400 mg Oral BID  . Chlorhexidine Gluconate Cloth  6 each Topical Daily  . citalopram  20 mg Oral Daily  . ezetimibe  10 mg Oral Daily   And  . simvastatin  40 mg Per Tube Daily  . furosemide  40 mg Oral Daily  . insulin aspart  0-5 Units Subcutaneous QHS  . insulin aspart  0-6 Units Subcutaneous TID WC  . mirtazapine  15 mg Oral QHS  . pantoprazole sodium  40 mg Oral Daily  . sodium bicarbonate  1,300 mg Oral TID  . sodium chloride flush  3 mL Intravenous Q12H   Elmarie Shiley, MD 03/10/2019, 8:38 AM

## 2019-03-10 NOTE — Progress Notes (Signed)
Tele reported 2.29 second pause. Pt assessed and asymptomatic. Provider notified.

## 2019-03-10 NOTE — Progress Notes (Signed)
PROGRESS NOTE    [name]  VHQ:[id number] DOB: [birth year] DOA: 03/04/2019  PCP: Benito Mccreedy, MD     Brief Narrative:  [name] is an 74 y.o. female with h/o DM; seizures; OSA; morbid obesity (BMI 46); HTN; HLD: stage 4 CKD; chronic diastolic CHF.  She was admitted on 2/7 to the cardiology team with acute on chronic diastolic CHF and an elevated troponin.  She also complained of leg weakness and falling.  She was found to have a L MCA infarct, s/p thrombectomy and started on ASA; she is not a candidate for DAPT or AC due to SDH from her fall.  She had post-operative respiratory failure and remained intubated but is now extubated and is recommended to have CPAP qhs given h/o OSA    Assessment & Plan:   Principal Problem:   Middle cerebral artery embolism, left Active Problems:   Hyperlipidemia   OSA (obstructive sleep apnea)   Essential hypertension   Junctional bradycardia   Seizures disorder   Diabetes mellitus type 2, insulin dependent (HCC)   Morbid obesity with BMI of 45.0-49.9, adult (HCC)   CKD (chronic kidney disease) stage 4, GFR 15-29 ml/min (HCC)   Acute hyperkalemia   Acute on chronic diastolic CHF (congestive heart failure) ([place])  Clinical problems list 1.  Acute exacerbation of chronic heart failure with preserved ejection fraction/junctional bradycardia 2.  Acute CVA/subdural hematoma 3.  Chronic kidney disease stage IV 4.  Hyperkalemia 5.  Essential hypertension 6.  Hyperlipidemia 7.  Diabetes mellitus type 2 8.  History of seizure disorder 9.  Obstructive sleep apnea 10.  Morbid obesity, BMI 44.3  1.  Acute exacerbation of chronic heart failure with preserved ejection fraction/junctional bradycardia.  Appears to have significant improvement with diuresis.  Blood pressure still running low though stable MAP of 60.  Limited echocardiogram showed ejection fraction 65 to 70%, normal left ventricular systolic function and no wall motion  abnormalities.  Cardiology following and does not think patient requires pacemaker at this stage IV junctional bradycardia. Continue with current medication per cardiology recommendation  2.  Acute CVA/subdural hematoma.  Status post thrombectomy revascularization by interventional radiology.  Subdural hematoma from previous fall. Continue with aspirin.  No anticoagulation in the setting of subdural hematoma and history of recurrent falls  3.  Chronic kidney disease stage IV (eGFR 15-29).  Serum creatinine at 4.0 today.  Nephrology following closely.  No indication for dialysis at this time  4.  Hyperkalemia.  Resolved.  Status post Kayexalate, Lokelma and sodium bicarb.  5.  Essential hypertension.  Currently running low blood pressure with systolics in the 41L. Hold all antihypertensive medication and monitor blood pressure closely  6.  Hyperlipidemia Continue with ezetimibe/simvastatin  7.  Diabetes mellitus type 2.  Hemoglobin A1c of 5.6 Continue with 7030. Hold all oral antidiabetes medication  8.  History of seizure disorder Continue with Tegretol  9.  Obstructive sleep apnea CPAP at night and as needed  10.  Morbid obesity with BMI 44.3 Dietary and lifestyle modification  DVT prophylaxis: SCD Code Status: Full code  Family Communication: None at bedside Disposition Plan: Patient is from home and will likely need subacute rehab/SNF with PT recommendation    Consultants:   Cardiology  Nephrology  Procedures: Left MCA thrombectomy by IR   Antimicrobials: None   Subjective: Patient was seen and evaluated at bedside.  Laying in bed and not in acute distress.  Alert and oriented x3.  Patient denies any  significant complaints today.  Has mild weakness in bilateral upper and lower extremities.  Muscle strength is 3/5 in left upper and lower extremities, and 2/5 in right upper and lower extremities.  Patient is interested in being able to ambulate prior to going home.   Patient is working with physical therapy.  Objective: Vitals:   03/10/19 0338 03/10/19 0341 03/10/19 0748 03/10/19 1126  BP: 111/80  (!) 98/51 107/60  Pulse: 66  (!) 50 (!) 52  Resp: '19  15 17  '$ Temp: 99.3 F (37.4 C)  98.4 F (36.9 C) 97.6 F (36.4 C)  TempSrc: Oral  Oral Oral  SpO2: 99%  91% 96%  Weight:  110 kg    Height:        Intake/Output Summary (Last 24 hours) at 03/10/2019 1311 Last data filed at 03/09/2019 1807 Gross per 24 hour  Intake 50 ml  Output --  Net 50 ml   Filed Weights   03/07/19 0446 03/09/19 0500 03/10/19 0341  Weight: 110 kg 113 kg 110 kg    Examination:  General exam: Appears calm and comfortable.  Not in acute distress.  She is obese Respiratory system: Clear to auscultation. Respiratory effort normal. Cardiovascular system: S1 & S2 heard, RRR. No JVD, murmurs, rubs, gallops or clicks.  2+ bilateral pitting edema. Gastrointestinal system: Abdomen is nondistended, soft and nontender. No organomegaly or masses felt. Normal bowel sounds heard. Central nervous system: Alert and oriented.  Bilateral upper and lower extremities weakness with strength of 3/5 left upper and lower and 2/5 in right upper and lower. Extremities: Bilateral weakness and decreased strength.   Skin: No rashes, lesions or ulcers Psychiatry: Judgement and insight appear normal. Mood & affect appropriate.     Data Reviewed: I have personally reviewed following labs and imaging studies  CBC: Recent Labs  Lab 03/04/19 0954 03/04/19 1012 03/04/19 1557 03/04/19 1557 03/06/19 0853 03/07/19 1228 03/07/19 1602 03/08/19 0545 03/10/19 0307  WBC 6.5  --  6.0  --  4.6  --   --  7.4 6.2  NEUTROABS 5.3  --   --   --   --   --   --  6.3 4.1  HGB 9.7*   < > 9.6*   < > 9.3* 9.2* 8.8* 8.4* 8.9*  HCT 29.4*   < > 27.5*   < > 28.0* 27.0* 26.0* 25.5* 26.4*  MCV 94.2  --  90.5  --  94.6  --   --  95.1 95.7  PLT 185  --  182  --  205  --   --  198 182   < > = values in this interval not  displayed.   Basic Metabolic Panel: Recent Labs  Lab 03/08/19 0545 03/09/19 0854 03/09/19 1709 03/09/19 2246 03/10/19 0307  NA 137 137 139 139 139  K 4.2 6.0* 4.8 4.2 4.1  CL 105 111 111 106 105  CO2 20* 12* 14* 19* 20*  GLUCOSE 162* 121* 212* 209* 163*  BUN 42* 50* 51* 50* 51*  CREATININE 2.51* 3.27* 3.69* 3.90* 4.00*  CALCIUM 8.2* 8.2* 8.5* 8.2* 8.5*  PHOS  --   --  5.5*  --  6.3*   GFR: Estimated Creatinine Clearance: 15.1 mL/min (A) (by C-G formula based on SCr of 4 mg/dL (H)). Liver Function Tests: Recent Labs  Lab 03/09/19 1709 03/09/19 2246 03/10/19 0307  AST  --  21  --   ALT  --  14  --   ALKPHOS  --  81  --   BILITOT  --  0.6  --   PROT  --  6.0*  --   ALBUMIN 2.8* 2.7* 2.9*   No results for input(s): LIPASE, AMYLASE in the last 168 hours. No results for input(s): AMMONIA in the last 168 hours. Coagulation Profile: No results for input(s): INR, PROTIME in the last 168 hours. Cardiac Enzymes: No results for input(s): CKTOTAL, CKMB, CKMBINDEX, TROPONINI in the last 168 hours. BNP (last 3 results) No results for input(s): PROBNP in the last 8760 hours. HbA1C: Recent Labs    03/08/19 0546  HGBA1C 5.6   CBG: Recent Labs  Lab 03/09/19 1248 03/09/19 1548 03/09/19 2221 03/10/19 0630 03/10/19 1125  GLUCAP 171* 210* 198* 154* 160*   Lipid Profile: Recent Labs    03/08/19 0545  CHOL 123  HDL 44  LDLCALC 55  TRIG 121  CHOLHDL 2.8   Thyroid Function Tests: No results for input(s): TSH, T4TOTAL, FREET4, T3FREE, THYROIDAB in the last 72 hours. Anemia Panel: No results for input(s): VITAMINB12, FOLATE, FERRITIN, TIBC, IRON, RETICCTPCT in the last 72 hours. Sepsis Labs: No results for input(s): PROCALCITON, LATICACIDVEN in the last 168 hours.  Recent Results (from the past 240 hour(s))  Blood Culture (routine x 2)     Status: None   Collection Time: 03/02/19  8:55 AM   Specimen: BLOOD  Result Value Ref Range Status   Specimen Description    Final    BLOOD RIGHT ANTECUBITAL Performed at [facility] [address], [place], [place] [address]    Special Requests   Final    BOTTLES DRAWN AEROBIC AND ANAEROBIC Blood Culture adequate volume Performed at [facility] [address] [place], [place] [address]    Culture   Final    NO GROWTH 5 DAYS Performed at [facility], [place] [address] [place], [place] [address]    Report Status 03/07/2019 FINAL  Final  Urine culture     Status: Abnormal   Collection Time: 03/02/19  8:55 AM   Specimen: In/Out Cath Urine  Result Value Ref Range Status   Specimen Description   Final    IN/OUT CATH URINE Performed at [facility] [address], [place], [place] [address]    Special Requests   Final    NONE Performed at [facility], [place] [address] [place], [place] [address]    Culture MULTIPLE SPECIES PRESENT, SUGGEST RECOLLECTION (A)  Final   Report Status 03/03/2019 FINAL  Final  Respiratory Panel by RT PCR (Flu A&B, Covid) -     Status: None   Collection Time: 03/02/19  8:55 AM  Result Value Ref Range Status   SARS Coronavirus 2 by RT PCR NEGATIVE NEGATIVE Final    Comment: (NOTE) SARS-CoV-2 target nucleic acids are NOT DETECTED. The SARS-CoV-2 RNA is generally detectable in upper respiratoy specimens during the acute phase of infection. The lowest concentration of SARS-CoV-2 viral copies this assay can detect is 131 copies/mL. A negative result does not preclude SARS-Cov-2 infection and should not be used as the sole basis for treatment or other patient management decisions. A negative result may occur with  improper specimen collection/handling, submission of specimen other than nasopharyngeal swab, presence of viral mutation(s) within the areas targeted by this assay, and inadequate number of viral copies (<131 copies/mL). A negative result must be combined with clinical observations,  patient history, and epidemiological information. The expected result is Negative. Fact  Sheet for Patients:  PinkCheek.be Fact Sheet for Healthcare Providers:  GravelBags.it This test is not yet ap proved or cleared by the [place] FDA and  has been authorized for detection and/or diagnosis of SARS-CoV-2 by FDA under an Emergency Use Authorization (EUA). This EUA will remain  in effect (meaning this test can be used) for the duration of the COVID-19 declaration under Section 564(b)(1) of the Act, 21 U.S.C. section 360bbb-3(b)(1), unless the authorization is terminated or revoked sooner.    Influenza A by PCR NEGATIVE NEGATIVE Final   Influenza B by PCR NEGATIVE NEGATIVE Final    Comment: (NOTE) The Xpert Xpress SARS-CoV-2/FLU/RSV assay is intended as an aid in  the diagnosis of influenza from Nasopharyngeal swab specimens and  should not be used as a sole basis for treatment. Nasal washings and  aspirates are unacceptable for Xpert Xpress SARS-CoV-2/FLU/RSV  testing. Fact Sheet for Patients: PinkCheek.be Fact Sheet for Healthcare Providers: GravelBags.it This test is not yet approved or cleared by the [place] FDA and  has been authorized for detection and/or diagnosis of SARS-CoV-2 by  FDA under an Emergency Use Authorization (EUA). This EUA will remain  in effect (meaning this test can be used) for the duration of the  Covid-19 declaration under Section 564(b)(1) of the Act, 21  U.S.C. section 360bbb-3(b)(1), unless the authorization is  terminated or revoked. Performed at [facility], [place] [address], [place], [place] [address]   Respiratory Panel by RT PCR (Flu A&B, Covid) - Nasopharyngeal Swab     Status: None   Collection Time: 03/04/19 12:05 PM   Specimen: Nasopharyngeal Swab  Result Value Ref Range Status   SARS Coronavirus 2 by  RT PCR NEGATIVE NEGATIVE Final    Comment: (NOTE) SARS-CoV-2 target nucleic acids are NOT DETECTED. The SARS-CoV-2 RNA is generally detectable in upper respiratoy specimens during the acute phase of infection. The lowest concentration of SARS-CoV-2 viral copies this assay can detect is 131 copies/mL. A negative result does not preclude SARS-Cov-2 infection and should not be used as the sole basis for treatment or other patient management decisions. A negative result may occur with  improper specimen collection/handling, submission of specimen other than nasopharyngeal swab, presence of viral mutation(s) within the areas targeted by this assay, and inadequate number of viral copies (<131 copies/mL). A negative result must be combined with clinical observations, patient history, and epidemiological information. The expected result is Negative. Fact Sheet for Patients:  PinkCheek.be Fact Sheet for Healthcare Providers:  GravelBags.it This test is not yet ap proved or cleared by the [place] FDA and  has been authorized for detection and/or diagnosis of SARS-CoV-2 by FDA under an Emergency Use Authorization (EUA). This EUA will remain  in effect (meaning this test can be used) for the duration of the COVID-19 declaration under Section 564(b)(1) of the Act, 21 U.S.C. section 360bbb-3(b)(1), unless the authorization is terminated or revoked sooner.    Influenza A by PCR NEGATIVE NEGATIVE Final   Influenza B by PCR NEGATIVE NEGATIVE Final    Comment: (NOTE) The Xpert Xpress SARS-CoV-2/FLU/RSV assay is intended as an aid in  the diagnosis of influenza from Nasopharyngeal swab specimens and  should not be used as a sole basis for treatment. Nasal washings and  aspirates are unacceptable for Xpert Xpress SARS-CoV-2/FLU/RSV  testing. Fact Sheet for Patients: PinkCheek.be Fact Sheet for Healthcare  Providers: GravelBags.it This test is not yet approved or cleared by the [place] FDA and  has  been authorized for detection and/or diagnosis of SARS-CoV-2 by  FDA under an Emergency Use Authorization (EUA). This EUA will remain  in effect (meaning this test can be used) for the duration of the  Covid-19 declaration under Section 564(b)(1) of the Act, 21  U.S.C. section 360bbb-3(b)(1), unless the authorization is  terminated or revoked. Performed at [facility], [place] [address] [place], [place] [address]   MRSA PCR Screening     Status: None   Collection Time: 03/07/19 10:40 AM   Specimen: Nasopharyngeal  Result Value Ref Range Status   MRSA by PCR NEGATIVE NEGATIVE Final    Comment:        The GeneXpert MRSA Assay (FDA approved for NASAL specimens only), is one component of a comprehensive MRSA colonization surveillance program. It is not intended to diagnose MRSA infection nor to guide or monitor treatment for MRSA infections. Performed at [facility], [place] [address], [place], [place] [address]          Radiology Studies: No results found.      Scheduled Meds: .  stroke: mapping our early stages of recovery book   Does not apply Once  . aspirin EC  81 mg Oral Daily  . carbamazepine  400 mg Oral BID  . Chlorhexidine Gluconate Cloth  6 each Topical Daily  . citalopram  20 mg Oral Daily  . ezetimibe  10 mg Oral Daily   And  . simvastatin  40 mg Per Tube Daily  . furosemide  40 mg Oral Daily  . insulin aspart  0-5 Units Subcutaneous QHS  . insulin aspart  0-6 Units Subcutaneous TID WC  . mirtazapine  15 mg Oral QHS  . pantoprazole sodium  40 mg Oral Daily  . sodium bicarbonate  1,300 mg Oral TID  . sodium chloride flush  3 mL Intravenous Q12H   Continuous Infusions: . sodium chloride       LOS: 5 days    Time spent: 35 minutes    Elie Confer, MD [facility] Pager [phone]  If 7PM-7AM,  please contact night-coverage [email or web address] Password [email or web address] 03/10/2019, 1:11 PM

## 2019-03-10 NOTE — Progress Notes (Signed)
Progress Note   Subjective   Doing well today, the patient denies CP or SOB.  No new concerns  Inpatient Medications    Scheduled Meds: .  stroke: mapping our early stages of recovery book   Does not apply Once  . aspirin EC  81 mg Oral Daily  . carbamazepine  400 mg Oral BID  . Chlorhexidine Gluconate Cloth  6 each Topical Daily  . citalopram  20 mg Oral Daily  . ezetimibe  10 mg Oral Daily   And  . simvastatin  40 mg Per Tube Daily  . furosemide  40 mg Oral Daily  . insulin aspart  0-5 Units Subcutaneous QHS  . insulin aspart  0-6 Units Subcutaneous TID WC  . mirtazapine  15 mg Oral QHS  . pantoprazole sodium  40 mg Oral Daily  . sodium bicarbonate  1,300 mg Oral TID  . sodium chloride flush  3 mL Intravenous Q12H   Continuous Infusions: . sodium chloride     PRN Meds: sodium chloride, acetaminophen **OR** acetaminophen (TYLENOL) oral liquid 160 mg/5 mL **OR** acetaminophen, acetaminophen, hydrALAZINE, nitroGLYCERIN, ondansetron (ZOFRAN) IV, sodium chloride flush   Vital Signs    Vitals:   03/10/19 0028 03/10/19 0338 03/10/19 0341 03/10/19 0748  BP: (!) 101/52 111/80  (!) 98/51  Pulse: (!) 54 66  (!) 50  Resp: 18 19  15   Temp: 99.1 F (37.3 C) 99.3 F (37.4 C)  98.4 F (36.9 C)  TempSrc: Oral Oral  Oral  SpO2: 96% 99%  91%  Weight:   110 kg   Height:        Intake/Output Summary (Last 24 hours) at 03/10/2019 1103 Last data filed at 03/09/2019 1807 Gross per 24 hour  Intake 50 ml  Output --  Net 50 ml   Filed Weights   03/07/19 0446 03/09/19 0500 03/10/19 0341  Weight: 110 kg 113 kg 110 kg    Telemetry    Junctional rhythm with frequent ectopy - Personally Reviewed  Physical Exam   GEN- The patient is elderly and ill appearing, alert , morbidly obese  Head- normocephalic, atraumatic Eyes-  Sclera clear, conjunctiva pink Ears- hearing intact Oropharynx- clear Neck- supple, Lungs-   normal work of breathing Heart- bradycardic regular rhythm  with ectopy GI- soft  Extremities- + dependant edema  MS- diffuse atrophy Skin- no rash or lesion Psych- euthymic mood, full affect   Labs    Chemistry Recent Labs  Lab 03/09/19 1709 03/09/19 2246 03/10/19 0307  NA 139 139 139  K 4.8 4.2 4.1  CL 111 106 105  CO2 14* 19* 20*  GLUCOSE 212* 209* 163*  BUN 51* 50* 51*  CREATININE 3.69* 3.90* 4.00*  CALCIUM 8.5* 8.2* 8.5*  PROT  --  6.0*  --   ALBUMIN 2.8* 2.7* 2.9*  AST  --  21  --   ALT  --  14  --   ALKPHOS  --  81  --   BILITOT  --  0.6  --   GFRNONAA 12* 11* 11*  GFRAA 14* 13* 12*  ANIONGAP 14 14 14      Hematology Recent Labs  Lab 03/06/19 0853 03/07/19 1228 03/07/19 1602 03/08/19 0545 03/10/19 0307  WBC 4.6  --   --  7.4 6.2  RBC 2.96*  --   --  2.68* 2.76*  HGB 9.3*   < > 8.8* 8.4* 8.9*  HCT 28.0*   < > 26.0* 25.5* 26.4*  MCV 94.6  --   --  95.1 95.7  MCH 31.4  --   --  31.3 32.2  MCHC 33.2  --   --  32.9 33.7  RDW 12.1  --   --  12.5 13.2  PLT 205  --   --  198 182   < > = values in this interval not displayed.    ekgs are reviewed personally    Patient Profile     74 y.o. female with hx of HFpEF, obesity, HTN, HLD, DM, CKD stage IV, bradycardia whowas admitted for heart failure exacerbation. Hospital course complicated by acute left ischemic stroke secondary to M1 occlusion. She underwent successful mechanical thrombectomy on 03/07/19  Assessment & Plan    1.  Junctional rhythm I agree with Dr Gardiner Rhyme that she appears to have episodes of junctional rhythm with ectopy.  I think this is less likely afib. No indication for pacemaker at this time.  I suspect as she is more active, her sinus rates will recover.  2. Stroke I would not advise ILR for this patient as she is currently not a candidate for anticoagulation.  I also think that junctional rhythm with frequent ectopy would prevent reliable data from the device.  3. Acute on chronic diastolic dysfunction Limited by renal  failure Nephrology to manage diuresis  4. Hypotension Antihypertensives are on hold  Cardiology to be available as needed this weekend.  Thompson Grayer MD, [facility] 03/10/2019 11:03 AM

## 2019-03-11 LAB — COMPREHENSIVE METABOLIC PANEL
ALT: 13 U/L (ref 0–44)
AST: 19 U/L (ref 15–41)
Albumin: 2.7 g/dL — ABNORMAL LOW (ref 3.5–5.0)
Alkaline Phosphatase: 81 U/L (ref 38–126)
Anion gap: 13 (ref 5–15)
BUN: 57 mg/dL — ABNORMAL HIGH (ref 8–23)
CO2: 22 mmol/L (ref 22–32)
Calcium: 8.2 mg/dL — ABNORMAL LOW (ref 8.9–10.3)
Chloride: 103 mmol/L (ref 98–111)
Creatinine, Ser: 4.69 mg/dL — ABNORMAL HIGH (ref 0.44–1.00)
GFR calc Af Amer: 10 mL/min — ABNORMAL LOW (ref >60)
GFR calc non Af Amer: 9 mL/min — ABNORMAL LOW (ref >60)
Glucose, Bld: 175 mg/dL — ABNORMAL HIGH (ref 70–99)
Potassium: 4.3 mmol/L (ref 3.5–5.1)
Sodium: 138 mmol/L (ref 135–145)
Total Bilirubin: 0.7 mg/dL (ref 0.3–1.2)
Total Protein: 6.2 g/dL — ABNORMAL LOW (ref 6.5–8.1)

## 2019-03-11 LAB — CBC
HCT: 23.2 % — ABNORMAL LOW (ref 36.0–46.0)
Hemoglobin: 7.7 g/dL — ABNORMAL LOW (ref 12.0–15.0)
MCH: 31.6 pg (ref 26.0–34.0)
MCHC: 33.2 g/dL (ref 30.0–36.0)
MCV: 95.1 fL (ref 80.0–100.0)
Platelets: 223 10*3/uL (ref 150–400)
RBC: 2.44 MIL/uL — ABNORMAL LOW (ref 3.87–5.11)
RDW: 12.8 % (ref 11.5–15.5)
WBC: 7.5 10*3/uL (ref 4.0–10.5)
nRBC: 0 % (ref 0.0–0.2)

## 2019-03-11 LAB — GLUCOSE, CAPILLARY
Glucose-Capillary: 154 mg/dL — ABNORMAL HIGH (ref 70–99)
Glucose-Capillary: 212 mg/dL — ABNORMAL HIGH (ref 70–99)
Glucose-Capillary: 213 mg/dL — ABNORMAL HIGH (ref 70–99)
Glucose-Capillary: 217 mg/dL — ABNORMAL HIGH (ref 70–99)

## 2019-03-11 LAB — PHOSPHORUS: Phosphorus: 5.8 mg/dL — ABNORMAL HIGH (ref 2.5–4.6)

## 2019-03-11 LAB — MAGNESIUM: Magnesium: 2.1 mg/dL (ref 1.7–2.4)

## 2019-03-11 MED ORDER — PANTOPRAZOLE SODIUM 40 MG PO TBEC
40.0000 mg | DELAYED_RELEASE_TABLET | Freq: Every day | ORAL | Status: DC
  Administered 2019-03-11 – 2019-03-14 (×4): 40 mg via ORAL
  Filled 2019-03-11 (×4): qty 1

## 2019-03-11 NOTE — Progress Notes (Signed)
Patient ID: [name], female   DOB: 01/16/1946, 74 y.o.   MRN: [id number] [facility] Progress Note   Assessment/ Plan:   1.    Acute kidney injury on chronic kidney disease stage IV: She appears to have had possibly hemodynamically mediated worsening of renal function with relative hypotension in the setting of recent contrast exposure.  Overnight with worsening renal function which raises a suspicion that she probably has had some ATN, urine output inadequately charted however she does not have any acute indications for dialysis.  We had a brief discussion today regarding hemodialysis and the risk that she would require chronic maintenance dialysis if she suffered worsening renal function/need for dialysis at this time-she will continue to think about whether she would want this long-term. 2.  Anemia: Without overt blood loss and without indications for PRBC at this time.  Continue to follow and check FOBT. 3.  Left MCA CVA status post cerebral angiogram with thrombectomy/revascularization by IR.  Not a candidate for anticoagulation due to associated subdural hematoma. 4.  Acute exacerbation of diastolic heart failure: With some mild shortness of breath-she reports that this is comparable to yesterday and for which we will continue furosemide 40 mg daily. 5.  Hypertension: Blood pressures within acceptable range on low-dose furosemide 40 mg daily.  Her usual antihypertensive therapy is currently on hold  Subjective:   Denies any chest pain or shortness of breath, denies any nausea or dysgeusia and does not have any abnormal limb jerking movements.   Objective:   BP (!) 116/55 (BP Location: Left Wrist)   Pulse (!) 50   Temp 98.5 F (36.9 C) (Oral)   Resp 18   Ht 5\' 2"  (1.575 m)   Wt 111.8 kg   SpO2 93%   BMI 45.08 kg/m   Intake/Output Summary (Last 24 hours) at 03/11/2019 0910 Last data filed at 03/11/2019 0507 Gross per 24 hour  Intake 360 ml  Output 300 ml  Net 60 ml    Weight change: 1.8 kg  Physical Exam: Gen: Resting comfortably in bed, propped up to watch television CVS: Pulse irregular, normal rate, S1 and S2 normal Resp: Diminished breath sounds over bases, no distinct rales or rhonchi. Abd: Soft, obese, nontender Ext: Venous stasis changes with trace lower extremity edema, no asterixis.  Imaging: No results found.  Labs: BMET Recent Labs  Lab 03/07/19 0542 03/07/19 1228 03/07/19 1602 03/08/19 0545 03/09/19 0854 03/09/19 1709 03/09/19 2246 03/10/19 0307 03/11/19 0455  NA 136   < > 135 137 137 139 139 139 138  K 4.5   < > 4.2 4.2 6.0* 4.8 4.2 4.1 4.3  CL 104  --   --  105 111 111 106 105 103  CO2 23  --   --  20* 12* 14* 19* 20* 22  GLUCOSE 125*  --   --  162* 121* 212* 209* 163* 175*  BUN 52*  --   --  42* 50* 51* 50* 51* 57*  CREATININE 3.17*  --   --  2.51* 3.27* 3.69* 3.90* 4.00* 4.69*  CALCIUM 8.4*  --   --  8.2* 8.2* 8.5* 8.2* 8.5* 8.2*  PHOS  --   --   --   --   --  5.5*  --  6.3* 5.8*   < > = values in this interval not displayed.   CBC Recent Labs  Lab 03/04/19 9030 03/04/19 1012 03/06/19 0923 03/07/19 1228 03/07/19 1602 03/08/19 0545 03/10/19 3007  03/11/19 0455  WBC 6.5   < > 4.6  --   --  7.4 6.2 7.5  NEUTROABS 5.3  --   --   --   --  6.3 4.1  --   HGB 9.7*   < > 9.3*   < > 8.8* 8.4* 8.9* 7.7*  HCT 29.4*   < > 28.0*   < > 26.0* 25.5* 26.4* 23.2*  MCV 94.2   < > 94.6  --   --  95.1 95.7 95.1  PLT 185   < > 205  --   --  198 182 223   < > = values in this interval not displayed.    Medications:    .  stroke: mapping our early stages of recovery book   Does not apply Once  . aspirin EC  81 mg Oral Daily  . carbamazepine  400 mg Oral BID  . Chlorhexidine Gluconate Cloth  6 each Topical Daily  . citalopram  20 mg Oral Daily  . ezetimibe  10 mg Oral Daily   And  . simvastatin  40 mg Per Tube Daily  . furosemide  40 mg Oral Daily  . insulin aspart  0-5 Units Subcutaneous QHS  . insulin aspart  0-6  Units Subcutaneous TID WC  . mirtazapine  15 mg Oral QHS  . pantoprazole sodium  40 mg Oral Daily  . sodium bicarbonate  1,300 mg Oral TID  . sodium chloride flush  3 mL Intravenous Q12H   Elmarie Shiley, MD 03/11/2019, 9:10 AM

## 2019-03-11 NOTE — Plan of Care (Signed)
  Problem: Education: Goal: Knowledge of General Education information will improve Description: Including pain rating scale, medication(s)/side effects and non-pharmacologic comfort measures Outcome: Progressing   Problem: Health Behavior/Discharge Planning: Goal: Ability to manage health-related needs will improve Outcome: Progressing   Problem: Clinical Measurements: Goal: Ability to maintain clinical measurements within normal limits will improve Outcome: Progressing Goal: Will remain free from infection Outcome: Progressing Goal: Diagnostic test results will improve Outcome: Progressing Goal: Respiratory complications will improve Outcome: Progressing Goal: Cardiovascular complication will be avoided Outcome: Progressing   Problem: Activity: Goal: Risk for activity intolerance will decrease Outcome: Progressing   Problem: Nutrition: Goal: Adequate nutrition will be maintained Outcome: Progressing   Problem: Coping: Goal: Level of anxiety will decrease Outcome: Progressing   Problem: Elimination: Goal: Will not experience complications related to urinary retention Outcome: Progressing   Problem: Pain Managment: Goal: General experience of comfort will improve Outcome: Progressing   Problem: Safety: Goal: Ability to remain free from injury will improve Outcome: Progressing   Problem: Skin Integrity: Goal: Risk for impaired skin integrity will decrease Outcome: Progressing   Problem: Education: Goal: Ability to demonstrate management of disease process will improve Outcome: Progressing Goal: Ability to verbalize understanding of medication therapies will improve Outcome: Progressing Goal: Individualized Educational Video(s) Outcome: Progressing   Problem: Activity: Goal: Capacity to carry out activities will improve Outcome: Progressing   Problem: Cardiac: Goal: Ability to achieve and maintain adequate cardiopulmonary perfusion will improve Outcome:  Progressing   Problem: Education: Goal: Understanding of cardiac disease, CV risk reduction, and recovery process will improve Outcome: Progressing Goal: Understanding of medication regimen will improve Outcome: Progressing Goal: Individualized Educational Video(s) Outcome: Progressing   Problem: Activity: Goal: Ability to tolerate increased activity will improve Outcome: Progressing   Problem: Cardiac: Goal: Ability to achieve and maintain adequate cardiopulmonary perfusion will improve Outcome: Progressing Goal: Vascular access site(s) Level 0-1 will be maintained Outcome: Progressing   Problem: Health Behavior/Discharge Planning: Goal: Ability to safely manage health-related needs after discharge will improve Outcome: Progressing   Problem: Education: Goal: Knowledge of disease or condition will improve Outcome: Progressing Goal: Knowledge of secondary prevention will improve Outcome: Progressing Goal: Knowledge of patient specific risk factors addressed and post discharge goals established will improve Outcome: Progressing Goal: Individualized Educational Video(s) Outcome: Progressing   Problem: Coping: Goal: Will verbalize positive feelings about self Outcome: Progressing Goal: Will identify appropriate support needs Outcome: Progressing   Problem: Health Behavior/Discharge Planning: Goal: Ability to manage health-related needs will improve Outcome: Progressing   Problem: Self-Care: Goal: Ability to participate in self-care as condition permits will improve Outcome: Progressing Goal: Verbalization of feelings and concerns over difficulty with self-care will improve Outcome: Progressing Goal: Ability to communicate needs accurately will improve Outcome: Progressing   Problem: Nutrition: Goal: Risk of aspiration will decrease Outcome: Progressing Goal: Dietary intake will improve Outcome: Progressing

## 2019-03-11 NOTE — Evaluation (Signed)
Speech Language Pathology Evaluation Patient Details Name: JAHAIRA EARNHART MRN: [id number] DOB: [birth year] Today's Date: 03/11/2019 Time: 9509-3267 SLP Time Calculation (min) (ACUTE ONLY): 28 min  Problem List:  Patient Active Problem List   Diagnosis Date Noted  . Middle cerebral artery embolism, left 03/07/2019  . Acute on chronic diastolic CHF (congestive heart failure) ([place]) 03/04/2019  . Acute respiratory failure with hypoxia ([place]) 05/02/2017  . Seizures disorder 05/02/2017  . Diabetes mellitus type 2, insulin dependent ([place]) 05/02/2017  . Morbid obesity with BMI of 45.0-49.9, adult ([place]) 05/02/2017  . Anemia 05/02/2017  . CKD (chronic kidney disease) stage 4, GFR 15-29 ml/min (H[facility]) 05/02/2017  . Dyslipidemia 05/02/2017  . Acute hyperkalemia 05/02/2017  . 1st degree AV block 05/02/2017  . Symptomatic bradycardia 11/27/2016  . DVT (deep venous thrombosis) ([place]) 06/30/2015  . Bradycardia with 31-40 beats per minute   . Emesis   . Junctional bradycardia   . Arterial embolism and thrombosis of upper extremity ([place])   . Bradycardia 12/11/2014  . Vomiting 12/11/2014  . Acute renal failure ([place]) 12/11/2014  . CKD (chronic kidney disease), stage IV ([place])   . Essential hypertension   . Type II diabetes mellitus ([place])   . OSA (obstructive sleep apnea) 10/17/2012  . Elevated troponin 09/26/2012  . Acute encephalopathy 09/25/2012  . Tremor 09/25/2012  . Rhabdomyolysis 09/25/2012  . Morbid obesity ([place]) 09/16/2006  . DEPRESSION, CHRONIC 09/16/2006  . Seizure ([place]) 09/16/2006  . Hyperlipidemia 09/09/2006  . CONSTIPATION, HX OF 09/09/2006   Past Medical History:  Past Medical History:  Diagnosis Date  . 1st degree AV block 05/02/2017  . Anemia   . Arterial embolism and thrombosis of upper extremity ([place])   . Chronic diastolic (congestive) heart failure ([place])    a. 04/2017 Echo: EF 60-65%. Gr1 DD. Mod LVH.   . CKD (chronic kidney disease) stage 4, GFR 15-29 ml/min (H[facility]) 05/02/2017   . Depression   . Diabetes mellitus type 2, insulin dependent ([place]) 05/02/2017  . DVT (deep venous thrombosis) ([place])   . Dyslipidemia 05/02/2017  . Essential hypertension   . GERD (gastroesophageal reflux disease)   . Hyperlipidemia   . Junctional bradycardia   . Morbid obesity with BMI of 45.0-49.9, adult ([place]) 05/02/2017  . OSA (obstructive sleep apnea) 10/17/2012  . Rhabdomyolysis 09/25/2012  . Seizures ([place])    "epilepsy; they think I had one a couple months ago" (12/11/2014)  . Symptomatic bradycardia 11/27/2016   Past Surgical History:  Past Surgical History:  Procedure Laterality Date  . IR CT HEAD LTD  03/07/2019  . IR PERCUTANEOUS ART THROMBECTOMY/INFUSION INTRACRANIAL INC DIAG ANGIO  03/07/2019  . RADIOLOGY WITH ANESTHESIA N/A 03/07/2019   Procedure: IR WITH ANESTHESIA;  Surgeon: Radiologist, Medication, MD;  Location: [facility];  Service: Radiology;  Laterality: N/A;  . THROMBECTOMY BRACHIAL ARTERY Left 12/15/2014   Procedure: THROMBECTOMY BRACHIAL ARTERY; VEIN PATCH ANGIOPLASTY LEFT BRACIAL ARTERY;  Surgeon: Angelia Mould, MD;  Location: [facility];  Service: Vascular;  Laterality: Left;  . TUBAL LIGATION     HPI:  Ms Nadra Hritz, 73y/f, presented to ED with weakness. admitted to [facility] with acute on chronic heart failure on 03/05/19. On 03/07/19 pt had a change in condition with right side weakness and gaze preference to the left. Pt found to have a right subdural hemorrhage. PMH of chronic diastolic heart failure, diabetes mellitus, hypertension, CKD stage IV, morbid obesity, obstructive sleep apnea, first-degree AV block. Pt lived alone prior to this hospitalization.  Assessment / Plan / Recommendation Clinical Impression  Cognitive language assessment completed at bedside. Pt agreeable to evaluation, however limited by lethargy. MOCA assessment score was 19/30 with greatest area of deficit was memory, specifically recalling new information (including information about current hospital  stay). Informal skills assessment also revealed deficits in the areas of language organization and sequencing of information. Pt's processing speed is slow and may be impacting the above mentioned areas. Recommend Speech Therapy to address memory (recall), as well as higher level language skilss including organization and sequencing.     SLP Assessment  SLP Recommendation/Assessment: All further Speech Lanaguage Pathology  needs can be addressed in the next venue of care SLP Visit Diagnosis: Cognitive communication deficit (R41.841)    Follow Up Recommendations  Skilled Nursing facility    Frequency and Duration           SLP Evaluation Cognition  Overall Cognitive Status: Impaired/Different from baseline Arousal/Alertness: Awake/alert Orientation Level: Oriented X4 Attention: Focused Focused Attention: Appears intact Memory: Impaired Memory Impairment: Decreased short term memory Decreased Short Term Memory: Verbal basic Immediate Memory Recall: Sock;Blue;Bed Memory Recall Sock: Without Cue Memory Recall Blue: Without Cue Memory Recall Bed: Not able to recall Awareness: Appears intact Problem Solving: Appears intact Executive Function: Reasoning;Sequencing;Organizing Reasoning: Impaired Reasoning Impairment: Verbal complex Sequencing: Impaired Sequencing Impairment: Verbal complex Organizing: Appears intact Safety/Judgment: Appears intact       Comprehension  Auditory Comprehension Overall Auditory Comprehension: Appears within functional limits for tasks assessed Yes/No Questions: Within Functional Limits Commands: Within Functional Limits Conversation: Simple Visual Recognition/Discrimination Discrimination: Within Function Limits Reading Comprehension Reading Status: Within funtional limits    Expression Expression Primary Mode of Expression: Verbal Verbal Expression Overall Verbal Expression: Appears within functional limits for tasks assessed Initiation: No  impairment Level of Generative/Spontaneous Verbalization: Sentence Repetition: No impairment Naming: No impairment Pragmatics: No impairment Written Expression Written Expression: Not tested   Oral / Motor  Oral Motor/Sensory Function Overall Oral Motor/Sensory Function: Within functional limits Motor Speech Overall Motor Speech: Appears within functional limits for tasks assessed Respiration: Within functional limits Phonation: Normal Resonance: Within functional limits Articulation: Within functional limitis Intelligibility: Intelligible Motor Planning: Witnin functional limits Motor Speech Errors: Not applicable   GO                    Wynelle Bourgeois., MA, CCC-SLP 03/11/2019, 3:31 PM

## 2019-03-11 NOTE — Progress Notes (Signed)
PROGRESS NOTE    [name]  YTK:[id number] DOB: 1945-12-15 DOA: 03/04/2019  PCP: Benito Mccreedy, MD     Brief Narrative:  [name] is an 74 y.o. female with h/o DM; seizures; OSA; morbid obesity (BMI 46); HTN; HLD: stage 4 CKD; chronic diastolic CHF.  She was admitted on 2/7 to the cardiology team with acute on chronic diastolic CHF and an elevated troponin.  She also complained of leg weakness and falling.  She was found to have a L MCA infarct, s/p thrombectomy and started on ASA; she is not a candidate for DAPT or AC due to SDH from her fall.  She had post-operative respiratory failure and remained intubated but is now extubated and is recommended to have CPAP qhs given h/o OSA    Assessment & Plan:   Principal Problem:   Middle cerebral artery embolism, left Active Problems:   Hyperlipidemia   OSA (obstructive sleep apnea)   Essential hypertension   Junctional bradycardia   Seizures disorder   Diabetes mellitus type 2, insulin dependent (HCC)   Morbid obesity with BMI of 45.0-49.9, adult (HCC)   CKD (chronic kidney disease) stage 4, GFR 15-29 ml/min (HCC)   Acute hyperkalemia   Acute on chronic diastolic CHF (congestive heart failure) ([facility])  Clinical problems list 1.  Acute exacerbation of chronic heart failure with preserved ejection fraction/junctional bradycardia 2.  Acute CVA/subdural hematoma 3.  Chronic kidney disease stage IV 4.  Hyperkalemia 5.  Essential hypertension 6.  Hyperlipidemia 7.  Diabetes mellitus type 2 8.  History of seizure disorder 9.  Obstructive sleep apnea 10.  Morbid obesity, BMI 44.3  1.  Acute exacerbation of chronic heart failure with preserved ejection fraction/junctional bradycardia.  Appears to have significant improvement with diuresis.  Blood pressure still running low though stable MAP of 60.  Limited echocardiogram showed ejection fraction 65 to 70%, normal left ventricular systolic function and no wall motion  abnormalities.  Cardiology following and does not think patient requires pacemaker at this stage due to junctional bradycardia. Continue with current medication per cardiology recommendation  2.  Acute CVA/subdural hematoma.  Status post thrombectomy revascularization by interventional radiology.  Subdural hematoma from previous fall. Continue with aspirin.  No anticoagulation in the setting of subdural hematoma and history of recurrent falls  3.  Chronic kidney disease stage IV (eGFR 15-29).  Serum creatinine trending up to 4.69 today.  Nephrology following closely.  No indication for dialysis at this time per nephrology. This may change with time.  4.  Hyperkalemia.  Resolved.  Status post Kayexalate, Lokelma and sodium bicarb.  5.  Essential hypertension.  Currently running low blood pressure with systolics in the 75T. Hold all antihypertensive medication and monitor blood pressure closely  6.  Hyperlipidemia Continue with ezetimibe/simvastatin  7.  Diabetes mellitus type 2.  Hemoglobin A1c of 5.6 Continue with 7030. Hold all oral antidiabetes medication  8.  History of seizure disorder Continue with Tegretol  9.  Obstructive sleep apnea CPAP at night and as needed  10.  Morbid obesity with BMI 44.3 Dietary and lifestyle modification  DVT prophylaxis: SCD Code Status: Full code  Family Communication: None at bedside Disposition Plan: Patient is from home and will likely need subacute rehab/SNF with PT recommendation    Consultants:   Cardiology  Nephrology  Procedures: Left MCA thrombectomy by IR   Antimicrobials: None   Subjective: Patient was seen and evaluated at bedside.  Laying in bed and not in acute  distress.  Alert and oriented x3.  Patient denies any significant complaints today.  Has mild weakness in bilateral upper and lower extremities.  No acute overnight event was reported Objective: Vitals:   03/11/19 0315 03/11/19 0413 03/11/19 0727 03/11/19  0815  BP:   (!) 116/55   Pulse:   (!) 50   Resp:   18 15  Temp:  99.6 F (37.6 C) 98.5 F (36.9 C)   TempSrc:  Oral Oral   SpO2:   93% 93%  Weight: 111.8 kg     Height:        Intake/Output Summary (Last 24 hours) at 03/11/2019 1106 Last data filed at 03/11/2019 0900 Gross per 24 hour  Intake 560 ml  Output 300 ml  Net 260 ml   Filed Weights   03/09/19 0500 03/10/19 0341 03/11/19 0315  Weight: 113 kg 110 kg 111.8 kg    Examination:  General exam: Appears calm and comfortable.  Not in acute distress.  She is obese Respiratory system: Clear to auscultation. Respiratory effort normal. Cardiovascular system: S1 & S2 heard, RRR. No JVD, murmurs, rubs, gallops or clicks.  2+ bilateral pitting edema. Gastrointestinal system: Abdomen is nondistended, soft and nontender. No organomegaly or masses felt. Normal bowel sounds heard. Central nervous system: Alert and oriented.  Bilateral upper and lower extremities weakness with strength of 3/5 left upper and lower and 2/5 in right upper and lower. Extremities: Bilateral weakness and decreased strength.   Skin: No rashes, lesions or ulcers Psychiatry: Judgement and insight appear normal. Mood & affect appropriate.     Data Reviewed: I have personally reviewed following labs and imaging studies  CBC: Recent Labs  Lab 03/04/19 1557 03/04/19 1557 03/06/19 0853 03/06/19 0853 03/07/19 1228 03/07/19 1602 03/08/19 0545 03/10/19 0307 03/11/19 0455  WBC 6.0  --  4.6  --   --   --  7.4 6.2 7.5  NEUTROABS  --   --   --   --   --   --  6.3 4.1  --   HGB 9.6*   < > 9.3*   < > 9.2* 8.8* 8.4* 8.9* 7.7*  HCT 27.5*   < > 28.0*   < > 27.0* 26.0* 25.5* 26.4* 23.2*  MCV 90.5  --  94.6  --   --   --  95.1 95.7 95.1  PLT 182  --  205  --   --   --  198 182 223   < > = values in this interval not displayed.   Basic Metabolic Panel: Recent Labs  Lab 03/09/19 0854 03/09/19 1709 03/09/19 2246 03/10/19 0307 03/11/19 0455  NA 137 139 139 139  138  K 6.0* 4.8 4.2 4.1 4.3  CL 111 111 106 105 103  CO2 12* 14* 19* 20* 22  GLUCOSE 121* 212* 209* 163* 175*  BUN 50* 51* 50* 51* 57*  CREATININE 3.27* 3.69* 3.90* 4.00* 4.69*  CALCIUM 8.2* 8.5* 8.2* 8.5* 8.2*  MG  --   --   --   --  2.1  PHOS  --  5.5*  --  6.3* 5.8*   GFR: Estimated Creatinine Clearance: 13 mL/min (A) (by C-G formula based on SCr of 4.69 mg/dL (H)). Liver Function Tests: Recent Labs  Lab 03/09/19 1709 03/09/19 2246 03/10/19 0307 03/11/19 0455  AST  --  21  --  19  ALT  --  14  --  13  ALKPHOS  --  81  --  81  BILITOT  --  0.6  --  0.7  PROT  --  6.0*  --  6.2*  ALBUMIN 2.8* 2.7* 2.9* 2.7*   No results for input(s): LIPASE, AMYLASE in the last 168 hours. No results for input(s): AMMONIA in the last 168 hours. Coagulation Profile: No results for input(s): INR, PROTIME in the last 168 hours. Cardiac Enzymes: No results for input(s): CKTOTAL, CKMB, CKMBINDEX, TROPONINI in the last 168 hours. BNP (last 3 results) No results for input(s): PROBNP in the last 8760 hours. HbA1C: No results for input(s): HGBA1C in the last 72 hours. CBG: Recent Labs  Lab 03/10/19 0630 03/10/19 1125 03/10/19 1552 03/10/19 2115 03/11/19 0609  GLUCAP 154* 160* 179* 237* 154*   Lipid Profile: No results for input(s): CHOL, HDL, LDLCALC, TRIG, CHOLHDL, LDLDIRECT in the last 72 hours. Thyroid Function Tests: No results for input(s): TSH, T4TOTAL, FREET4, T3FREE, THYROIDAB in the last 72 hours. Anemia Panel: No results for input(s): VITAMINB12, FOLATE, FERRITIN, TIBC, IRON, RETICCTPCT in the last 72 hours. Sepsis Labs: No results for input(s): PROCALCITON, LATICACIDVEN in the last 168 hours.  Recent Results (from the past 240 hour(s))  Blood Culture (routine x 2)     Status: None   Collection Time: 03/02/19  8:55 AM   Specimen: BLOOD  Result Value Ref Range Status   Specimen Description   Final    BLOOD RIGHT ANTECUBITAL Performed at [facility] [address] [place], [place] [address]    Special Requests   Final    BOTTLES DRAWN AEROBIC AND ANAEROBIC Blood Culture adequate volume Performed at [facility] [address], [place], [place] [address]    Culture   Final    NO GROWTH 5 DAYS Performed at [facility], [place] [address], [place], [place] [address]    Report Status 03/07/2019 FINAL  Final  Urine culture     Status: Abnormal   Collection Time: 03/02/19  8:55 AM   Specimen: In/Out Cath Urine  Result Value Ref Range Status   Specimen Description   Final    IN/OUT CATH URINE Performed at [facility] [address], [place], [place] [address]    Special Requests   Final    NONE Performed at [facility], [place] [address], [place], [place] [address]    Culture MULTIPLE SPECIES PRESENT, SUGGEST RECOLLECTION (A)  Final   Report Status 03/03/2019 FINAL  Final  Respiratory Panel by RT PCR (Flu A&B, Covid) -     Status: None   Collection Time: 03/02/19  8:55 AM  Result Value Ref Range Status   SARS Coronavirus 2 by RT PCR NEGATIVE NEGATIVE Final    Comment: (NOTE) SARS-CoV-2 target nucleic acids are NOT DETECTED. The SARS-CoV-2 RNA is generally detectable in upper respiratoy specimens during the acute phase of infection. The lowest concentration of SARS-CoV-2 viral copies this assay can detect is 131 copies/mL. A negative result does not preclude SARS-Cov-2 infection and should not be used as the sole basis for treatment or other patient management decisions. A negative result may occur with  improper specimen collection/handling, submission of specimen other than nasopharyngeal swab, presence of viral mutation(s) within the areas targeted by this assay, and inadequate number of viral copies (<131 copies/mL). A negative result must be combined with clinical observations, patient history, and epidemiological information. The expected result is  Negative. Fact Sheet for Patients:  PinkCheek.be Fact Sheet for Healthcare Providers:  GravelBags.it This test is not yet ap proved or cleared by the [place] and  has been authorized for detection and/or diagnosis of SARS-CoV-2 by FDA under an Emergency Use Authorization (EUA). This EUA will remain  in effect (meaning this test can be used) for the duration of the COVID-19 declaration under Section 564(b)(1) of the Act, 21 U.S.C. section 360bbb-3(b)(1), unless the authorization is terminated or revoked sooner.    Influenza A by PCR NEGATIVE NEGATIVE Final   Influenza B by PCR NEGATIVE NEGATIVE Final    Comment: (NOTE) The Xpert Xpress SARS-CoV-2/FLU/RSV assay is intended as an aid in  the diagnosis of influenza from Nasopharyngeal swab specimens and  should not be used as a sole basis for treatment. Nasal washings and  aspirates are unacceptable for Xpert Xpress SARS-CoV-2/FLU/RSV  testing. Fact Sheet for Patients: PinkCheek.be Fact Sheet for Healthcare Providers: GravelBags.it This test is not yet approved or cleared by the [place] FDA and  has been authorized for detection and/or diagnosis of SARS-CoV-2 by  FDA under an Emergency Use Authorization (EUA). This EUA will remain  in effect (meaning this test can be used) for the duration of the  Covid-19 declaration under Section 564(b)(1) of the Act, 21  U.S.C. section 360bbb-3(b)(1), unless the authorization is  terminated or revoked. Performed at [facility], [place] [address], [place], [place] [address]   Respiratory Panel by RT PCR (Flu A&B, Covid) - Nasopharyngeal Swab     Status: None   Collection Time: 03/04/19 12:05 PM   Specimen: Nasopharyngeal Swab  Result Value Ref Range Status   SARS Coronavirus 2 by RT PCR NEGATIVE NEGATIVE Final    Comment: (NOTE) SARS-CoV-2 target  nucleic acids are NOT DETECTED. The SARS-CoV-2 RNA is generally detectable in upper respiratoy specimens during the acute phase of infection. The lowest concentration of SARS-CoV-2 viral copies this assay can detect is 131 copies/mL. A negative result does not preclude SARS-Cov-2 infection and should not be used as the sole basis for treatment or other patient management decisions. A negative result may occur with  improper specimen collection/handling, submission of specimen other than nasopharyngeal swab, presence of viral mutation(s) within the areas targeted by this assay, and inadequate number of viral copies (<131 copies/mL). A negative result must be combined with clinical observations, patient history, and epidemiological information. The expected result is Negative. Fact Sheet for Patients:  PinkCheek.be Fact Sheet for Healthcare Providers:  GravelBags.it This test is not yet ap proved or cleared by the [place] FDA and  has been authorized for detection and/or diagnosis of SARS-CoV-2 by FDA under an Emergency Use Authorization (EUA). This EUA will remain  in effect (meaning this test can be used) for the duration of the COVID-19 declaration under Section 564(b)(1) of the Act, 21 U.S.C. section 360bbb-3(b)(1), unless the authorization is terminated or revoked sooner.    Influenza A by PCR NEGATIVE NEGATIVE Final   Influenza B by PCR NEGATIVE NEGATIVE Final    Comment: (NOTE) The Xpert Xpress SARS-CoV-2/FLU/RSV assay is intended as an aid in  the diagnosis of influenza from Nasopharyngeal swab specimens and  should not be used as a sole basis for treatment. Nasal washings and  aspirates are unacceptable for Xpert Xpress SARS-CoV-2/FLU/RSV  testing. Fact Sheet for Patients: PinkCheek.be Fact Sheet for Healthcare Providers: GravelBags.it This test is not  yet approved or cleared by the [place] FDA and  has been authorized for detection and/or diagnosis of SARS-CoV-2 by  FDA  under an Emergency Use Authorization (EUA). This EUA will remain  in effect (meaning this test can be used) for the duration of the  Covid-19 declaration under Section 564(b)(1) of the Act, 21  U.S.C. section 360bbb-3(b)(1), unless the authorization is  terminated or revoked. Performed at [facility], [place] [address], [place], [place] [address]   MRSA PCR Screening     Status: None   Collection Time: 03/07/19 10:40 AM   Specimen: Nasopharyngeal  Result Value Ref Range Status   MRSA by PCR NEGATIVE NEGATIVE Final    Comment:        The GeneXpert MRSA Assay (FDA approved for NASAL specimens only), is one component of a comprehensive MRSA colonization surveillance program. It is not intended to diagnose MRSA infection nor to guide or monitor treatment for MRSA infections. Performed at [facility], [place] [address], [place], [place] [address]          Radiology Studies: No results found.      Scheduled Meds: .  stroke: mapping our early stages of recovery book   Does not apply Once  . aspirin EC  81 mg Oral Daily  . carbamazepine  400 mg Oral BID  . Chlorhexidine Gluconate Cloth  6 each Topical Daily  . citalopram  20 mg Oral Daily  . ezetimibe  10 mg Oral Daily   And  . simvastatin  40 mg Per Tube Daily  . furosemide  40 mg Oral Daily  . insulin aspart  0-5 Units Subcutaneous QHS  . insulin aspart  0-6 Units Subcutaneous TID WC  . mirtazapine  15 mg Oral QHS  . pantoprazole  40 mg Oral Daily  . sodium bicarbonate  1,300 mg Oral TID  . sodium chloride flush  3 mL Intravenous Q12H   Continuous Infusions: . sodium chloride       LOS: 6 days    Time spent: 35 minutes    Elie Confer, MD [facility] Pager [phone]  If 7PM-7AM, please contact night-coverage [email or web address] Password [facility] 03/11/2019, 11:06  AM

## 2019-03-11 NOTE — Progress Notes (Signed)
Hgb 7.7 (down from 8.9) Provider notified.

## 2019-03-12 LAB — GLUCOSE, CAPILLARY
Glucose-Capillary: 134 mg/dL — ABNORMAL HIGH (ref 70–99)
Glucose-Capillary: 153 mg/dL — ABNORMAL HIGH (ref 70–99)
Glucose-Capillary: 176 mg/dL — ABNORMAL HIGH (ref 70–99)
Glucose-Capillary: 218 mg/dL — ABNORMAL HIGH (ref 70–99)

## 2019-03-12 LAB — COMPREHENSIVE METABOLIC PANEL
ALT: 15 U/L (ref 0–44)
AST: 19 U/L (ref 15–41)
Albumin: 2.6 g/dL — ABNORMAL LOW (ref 3.5–5.0)
Alkaline Phosphatase: 77 U/L (ref 38–126)
Anion gap: 15 (ref 5–15)
BUN: 58 mg/dL — ABNORMAL HIGH (ref 8–23)
CO2: 21 mmol/L — ABNORMAL LOW (ref 22–32)
Calcium: 8.1 mg/dL — ABNORMAL LOW (ref 8.9–10.3)
Chloride: 101 mmol/L (ref 98–111)
Creatinine, Ser: 4.3 mg/dL — ABNORMAL HIGH (ref 0.44–1.00)
GFR calc Af Amer: 11 mL/min — ABNORMAL LOW (ref >60)
GFR calc non Af Amer: 10 mL/min — ABNORMAL LOW (ref >60)
Glucose, Bld: 140 mg/dL — ABNORMAL HIGH (ref 70–99)
Potassium: 4.7 mmol/L (ref 3.5–5.1)
Sodium: 137 mmol/L (ref 135–145)
Total Bilirubin: 0.8 mg/dL (ref 0.3–1.2)
Total Protein: 5.8 g/dL — ABNORMAL LOW (ref 6.5–8.1)

## 2019-03-12 LAB — CBC
HCT: 22.7 % — ABNORMAL LOW (ref 36.0–46.0)
Hemoglobin: 7.6 g/dL — ABNORMAL LOW (ref 12.0–15.0)
MCH: 31.3 pg (ref 26.0–34.0)
MCHC: 33.5 g/dL (ref 30.0–36.0)
MCV: 93.4 fL (ref 80.0–100.0)
Platelets: 236 K/uL (ref 150–400)
RBC: 2.43 MIL/uL — ABNORMAL LOW (ref 3.87–5.11)
RDW: 12.8 % (ref 11.5–15.5)
WBC: 7.1 K/uL (ref 4.0–10.5)
nRBC: 0 % (ref 0.0–0.2)

## 2019-03-12 LAB — PHOSPHORUS: Phosphorus: 5.1 mg/dL — ABNORMAL HIGH (ref 2.5–4.6)

## 2019-03-12 LAB — MAGNESIUM: Magnesium: 2.1 mg/dL (ref 1.7–2.4)

## 2019-03-12 NOTE — Progress Notes (Signed)
PROGRESS NOTE    [name]  KZL:[id number] DOB: 1945/10/17 DOA: 03/04/2019  PCP: Benito Mccreedy, MD     Brief Narrative:  [name] is an 74 y.o. female with h/o DM; seizures; OSA; morbid obesity (BMI 46); HTN; HLD: stage 4 CKD; chronic diastolic CHF.  She was admitted on 2/7 to the cardiology team with acute on chronic diastolic CHF and an elevated troponin.  She also complained of leg weakness and falling.  She was found to have a L MCA infarct, s/p thrombectomy and started on ASA; she is not a candidate for DAPT or AC due to SDH from her fall.  She had post-operative respiratory failure and remained intubated but is now extubated and is recommended to have CPAP qhs given h/o OSA  Assessment & Plan:   Principal Problem:   Middle cerebral artery embolism, left Active Problems:   Hyperlipidemia   OSA (obstructive sleep apnea)   Essential hypertension   Junctional bradycardia   Seizures disorder   Diabetes mellitus type 2, insulin dependent (HCC)   Morbid obesity with BMI of 45.0-49.9, adult (HCC)   CKD (chronic kidney disease) stage 4, GFR 15-29 ml/min (HCC)   Acute hyperkalemia   Acute on chronic diastolic CHF (congestive heart failure) ([place])  Clinical problems list 1.  Acute exacerbation of chronic heart failure with preserved ejection fraction/junctional bradycardia 2.  Acute CVA/subdural hematoma 3.  Chronic kidney disease stage IV 4.  Hyperkalemia 5.  Essential hypertension 6.  Hyperlipidemia 7.  Diabetes mellitus type 2 8.  History of seizure disorder 9.  Obstructive sleep apnea 10.  Morbid obesity, BMI 44.3  1.  Acute exacerbation of chronic heart failure with preserved ejection fraction/junctional bradycardia.   Limited echocardiogram showed ejection fraction 65 to 70%, normal left ventricular systolic function and no wall motion abnormalities.  Cardiology following and does not think patient requires pacemaker at this stage due to junctional bradycardia.   Diuresis limited due to renal failure.  Nephrology following and leading diuresis.  Patient remains on Lasix 40 mg p.o. daily.  Strict I's and O's and daily weights.  Cardiology on board as well.  2.  Acute CVA/subdural hematoma.  Status post thrombectomy revascularization by interventional radiology.  Subdural hematoma from previous fall. Continue with aspirin.  No anticoagulation in the setting of subdural hematoma and history of recurrent falls. Last seen by PT OT several days ago.  Recommendation for SNF.  Will reconsult them today.  3.  Chronic kidney disease stage IV (eGFR 15-29).  Looks like creatinine has peaked at 4.69 on 03/11/2019.  It is better at 4.30 today.  Nephrology following.  No indication of dialysis.  Appreciate their help.   4.  Hyperkalemia.  Resolved.  5.  Essential hypertension.  Blood pressure better and within normal range. Hold all antihypertensive medication and monitor blood pressure closely  6.  Hyperlipidemia Continue with ezetimibe/simvastatin  7.  Diabetes mellitus type 2.  Hemoglobin A1c of 5.6.  Blood are controlled.  Patient is not on any 70/30 insulin.  Continue SSI.  8.  History of seizure disorder Continue with Tegretol  9.  Obstructive sleep apnea CPAP at night and as needed  10.  Morbid obesity with BMI 44.3 Dietary and lifestyle modification  DVT prophylaxis: SCD/avoiding heparin products due to subdural hematoma Code Status: Full code  Family Communication: None at bedside Disposition Plan: Patient is from home and will likely need subacute rehab/SNF with PT recommendation Discharge barrier:  Clinical improvement. Patient  is from: Home   Consultants:   Cardiology  Nephrology  Procedures: Left MCA thrombectomy by IR   Antimicrobials: None   Subjective: Patient seen and examined.  Complains of bilateral leg pain and feet pain which is burning.  Generalized weakness.  No other complaint.  Denied any chest pain, shortness of  breath.  Objective: Vitals:   03/12/19 0343 03/12/19 0348 03/12/19 0826 03/12/19 1201  BP: 124/60   (!) 136/59  Pulse: 67  64 68  Resp: 15   17  Temp: 98.9 F (37.2 C)  98.8 F (37.1 C) 98.4 F (36.9 C)  TempSrc: Oral  Oral Oral  SpO2: 95%   95%  Weight:  112.2 kg    Height:        Intake/Output Summary (Last 24 hours) at 03/12/2019 1309 Last data filed at 03/12/2019 1200 Gross per 24 hour  Intake 480 ml  Output 1350 ml  Net -870 ml   Filed Weights   03/10/19 0341 03/11/19 0315 03/12/19 0348  Weight: 110 kg 111.8 kg 112.2 kg    Examination:  General exam: Appears calm and comfortable  Respiratory system: Clear to auscultation. Respiratory effort normal. Cardiovascular system: S1 & S2 heard, RRR. No JVD, murmurs, rubs, gallops or clicks.  +1 pitting edema bilateral lower extremity Gastrointestinal system: Abdomen is nondistended, soft and nontender. No organomegaly or masses felt. Normal bowel sounds heard. Central nervous system: Alert and oriented. No focal neurological deficits. Extremities: Symmetrical power 5 x 5 all extremities. Skin: No rashes, lesions or ulcers.  Psychiatry: Judgement and insight appear normal. Mood & affect appropriate.   Data Reviewed: I have personally reviewed following labs and imaging studies  CBC: Recent Labs  Lab 03/06/19 0853 03/07/19 1228 03/07/19 1602 03/08/19 0545 03/10/19 0307 03/11/19 0455 03/12/19 0504  WBC 4.6  --   --  7.4 6.2 7.5 7.1  NEUTROABS  --   --   --  6.3 4.1  --   --   HGB 9.3*   < > 8.8* 8.4* 8.9* 7.7* 7.6*  HCT 28.0*   < > 26.0* 25.5* 26.4* 23.2* 22.7*  MCV 94.6  --   --  95.1 95.7 95.1 93.4  PLT 205  --   --  198 182 223 236   < > = values in this interval not displayed.   Basic Metabolic Panel: Recent Labs  Lab 03/09/19 1709 03/09/19 2246 03/10/19 0307 03/11/19 0455 03/12/19 0504  NA 139 139 139 138 137  K 4.8 4.2 4.1 4.3 4.7  CL 111 106 105 103 101  CO2 14* 19* 20* 22 21*  GLUCOSE 212* 209*  163* 175* 140*  BUN 51* 50* 51* 57* 58*  CREATININE 3.69* 3.90* 4.00* 4.69* 4.30*  CALCIUM 8.5* 8.2* 8.5* 8.2* 8.1*  MG  --   --   --  2.1 2.1  PHOS 5.5*  --  6.3* 5.8* 5.1*   GFR: Estimated Creatinine Clearance: 14.2 mL/min (A) (by C-G formula based on SCr of 4.3 mg/dL (H)). Liver Function Tests: Recent Labs  Lab 03/09/19 1709 03/09/19 2246 03/10/19 0307 03/11/19 0455 03/12/19 0504  AST  --  21  --  19 19  ALT  --  14  --  13 15  ALKPHOS  --  81  --  81 77  BILITOT  --  0.6  --  0.7 0.8  PROT  --  6.0*  --  6.2* 5.8*  ALBUMIN 2.8* 2.7* 2.9* 2.7* 2.6*   No  results for input(s): LIPASE, AMYLASE in the last 168 hours. No results for input(s): AMMONIA in the last 168 hours. Coagulation Profile: No results for input(s): INR, PROTIME in the last 168 hours. Cardiac Enzymes: No results for input(s): CKTOTAL, CKMB, CKMBINDEX, TROPONINI in the last 168 hours. BNP (last 3 results) No results for input(s): PROBNP in the last 8760 hours. HbA1C: No results for input(s): HGBA1C in the last 72 hours. CBG: Recent Labs  Lab 03/11/19 1142 03/11/19 1547 03/11/19 2114 03/12/19 0612 03/12/19 1206  GLUCAP 212* 213* 217* 134* 153*   Lipid Profile: No results for input(s): CHOL, HDL, LDLCALC, TRIG, CHOLHDL, LDLDIRECT in the last 72 hours. Thyroid Function Tests: No results for input(s): TSH, T4TOTAL, FREET4, T3FREE, THYROIDAB in the last 72 hours. Anemia Panel: No results for input(s): VITAMINB12, FOLATE, FERRITIN, TIBC, IRON, RETICCTPCT in the last 72 hours. Sepsis Labs: No results for input(s): PROCALCITON, LATICACIDVEN in the last 168 hours.  Recent Results (from the past 240 hour(s))  Respiratory Panel by RT PCR (Flu A&B, Covid) - Nasopharyngeal Swab     Status: None   Collection Time: 03/04/19 12:05 PM   Specimen: Nasopharyngeal Swab  Result Value Ref Range Status   SARS Coronavirus 2 by RT PCR NEGATIVE NEGATIVE Final    Comment: (NOTE) SARS-CoV-2 target nucleic acids are NOT  DETECTED. The SARS-CoV-2 RNA is generally detectable in upper respiratoy specimens during the acute phase of infection. The lowest concentration of SARS-CoV-2 viral copies this assay can detect is 131 copies/mL. A negative result does not preclude SARS-Cov-2 infection and should not be used as the sole basis for treatment or other patient management decisions. A negative result may occur with  improper specimen collection/handling, submission of specimen other than nasopharyngeal swab, presence of viral mutation(s) within the areas targeted by this assay, and inadequate number of viral copies (<131 copies/mL). A negative result must be combined with clinical observations, patient history, and epidemiological information. The expected result is Negative. Fact Sheet for Patients:  PinkCheek.be Fact Sheet for Healthcare Providers:  GravelBags.it This test is not yet ap proved or cleared by the [place] FDA and  has been authorized for detection and/or diagnosis of SARS-CoV-2 by FDA under an Emergency Use Authorization (EUA). This EUA will remain  in effect (meaning this test can be used) for the duration of the COVID-19 declaration under Section 564(b)(1) of the Act, 21 U.S.C. section 360bbb-3(b)(1), unless the authorization is terminated or revoked sooner.    Influenza A by PCR NEGATIVE NEGATIVE Final   Influenza B by PCR NEGATIVE NEGATIVE Final    Comment: (NOTE) The Xpert Xpress SARS-CoV-2/FLU/RSV assay is intended as an aid in  the diagnosis of influenza from Nasopharyngeal swab specimens and  should not be used as a sole basis for treatment. Nasal washings and  aspirates are unacceptable for Xpert Xpress SARS-CoV-2/FLU/RSV  testing. Fact Sheet for Patients: PinkCheek.be Fact Sheet for Healthcare Providers: GravelBags.it This test is not yet approved or cleared  by the [place] FDA and  has been authorized for detection and/or diagnosis of SARS-CoV-2 by  FDA under an Emergency Use Authorization (EUA). This EUA will remain  in effect (meaning this test can be used) for the duration of the  Covid-19 declaration under Section 564(b)(1) of the Act, 21  U.S.C. section 360bbb-3(b)(1), unless the authorization is  terminated or revoked. Performed at [facility], [place] [address] [place], [place] [address]   MRSA PCR Screening     Status: None  Collection Time: 03/07/19 10:40 AM   Specimen: Nasopharyngeal  Result Value Ref Range Status   MRSA by PCR NEGATIVE NEGATIVE Final    Comment:        The GeneXpert MRSA Assay (FDA approved for NASAL specimens only), is one component of a comprehensive MRSA colonization surveillance program. It is not intended to diagnose MRSA infection nor to guide or monitor treatment for MRSA infections. Performed at [facility], [place] [address] [place], [place] [address]          Radiology Studies: No results found.      Scheduled Meds:   stroke: mapping our early stages of recovery book   Does not apply Once   aspirin EC  81 mg Oral Daily   carbamazepine  400 mg Oral BID   Chlorhexidine Gluconate Cloth  6 each Topical Daily   citalopram  20 mg Oral Daily   ezetimibe  10 mg Oral Daily   And   simvastatin  40 mg Per Tube Daily   furosemide  40 mg Oral Daily   insulin aspart  0-5 Units Subcutaneous QHS   insulin aspart  0-6 Units Subcutaneous TID WC   mirtazapine  15 mg Oral QHS   pantoprazole  40 mg Oral Daily   sodium bicarbonate  1,300 mg Oral TID   sodium chloride flush  3 mL Intravenous Q12H   Continuous Infusions:  sodium chloride       LOS: 7 days    Time spent: 35 minutes.  Darliss Cheney, MD [facility] If 7PM-7AM, please contact night-coverage [email or web address] Password [facility] 03/12/2019, 1:09 PM

## 2019-03-12 NOTE — Progress Notes (Signed)
Patient ID: [name], female   DOB: November 22, 1945, 73 y.o.   MRN: [id number] S: Feels well, no complaints O:BP (!) 136/59 (BP Location: Left Wrist)   Pulse 68   Temp 98.4 F (36.9 C) (Oral)   Resp 17   Ht 5\' 2"  (1.575 m)   Wt 112.2 kg   SpO2 95%   BMI 45.24 kg/m   Intake/Output Summary (Last 24 hours) at 03/12/2019 1439 Last data filed at 03/12/2019 1200 Gross per 24 hour  Intake 480 ml  Output 1350 ml  Net -870 ml   Intake/Output: I/O last 3 completed shifts: In: 200 [P.O.:200] Out: 700 [Urine:700]  Intake/Output this shift:  Total I/O In: 480 [P.O.:480] Out: 800 [Urine:800] Weight change: 0.4 kg Gen: NAD CVS: no rub Resp: CTA Abd: +BS, obese, NT Ext: trace lower ext edema  Neuro: no asterixis  Recent Labs  Lab 03/08/19 0545 03/09/19 0854 03/09/19 1709 03/09/19 2246 03/10/19 0307 03/11/19 0455 03/12/19 0504  NA 137 137 139 139 139 138 137  K 4.2 6.0* 4.8 4.2 4.1 4.3 4.7  CL 105 111 111 106 105 103 101  CO2 20* 12* 14* 19* 20* 22 21*  GLUCOSE 162* 121* 212* 209* 163* 175* 140*  BUN 42* 50* 51* 50* 51* 57* 58*  CREATININE 2.51* 3.27* 3.69* 3.90* 4.00* 4.69* 4.30*  ALBUMIN  --   --  2.8* 2.7* 2.9* 2.7* 2.6*  CALCIUM 8.2* 8.2* 8.5* 8.2* 8.5* 8.2* 8.1*  PHOS  --   --  5.5*  --  6.3* 5.8* 5.1*  AST  --   --   --  21  --  19 19  ALT  --   --   --  14  --  13 15   Liver Function Tests: Recent Labs  Lab 03/09/19 2246 03/09/19 2246 03/10/19 0307 03/11/19 0455 03/12/19 0504  AST 21  --   --  19 19  ALT 14  --   --  13 15  ALKPHOS 81  --   --  81 77  BILITOT 0.6  --   --  0.7 0.8  PROT 6.0*  --   --  6.2* 5.8*  ALBUMIN 2.7*   < > 2.9* 2.7* 2.6*   < > = values in this interval not displayed.   No results for input(s): LIPASE, AMYLASE in the last 168 hours. No results for input(s): AMMONIA in the last 168 hours. CBC: Recent Labs  Lab 03/06/19 0853 03/07/19 1228 03/08/19 0545 03/08/19 0545 03/10/19 0307 03/11/19 0455 03/12/19 0504  WBC 4.6  --   7.4   < > 6.2 7.5 7.1  NEUTROABS  --   --  6.3  --  4.1  --   --   HGB 9.3*   < > 8.4*   < > 8.9* 7.7* 7.6*  HCT 28.0*   < > 25.5*   < > 26.4* 23.2* 22.7*  MCV 94.6  --  95.1  --  95.7 95.1 93.4  PLT 205  --  198   < > 182 223 236   < > = values in this interval not displayed.   Cardiac Enzymes: No results for input(s): CKTOTAL, CKMB, CKMBINDEX, TROPONINI in the last 168 hours. CBG: Recent Labs  Lab 03/11/19 1142 03/11/19 1547 03/11/19 2114 03/12/19 0612 03/12/19 1206  GLUCAP 212* 213* 217* 134* 153*    Iron Studies: No results for input(s): IRON, TIBC, TRANSFERRIN, FERRITIN in the last 72 hours. Studies/Results: No results  found. .  stroke: mapping our early stages of recovery book   Does not apply Once  . aspirin EC  81 mg Oral Daily  . carbamazepine  400 mg Oral BID  . Chlorhexidine Gluconate Cloth  6 each Topical Daily  . citalopram  20 mg Oral Daily  . ezetimibe  10 mg Oral Daily   And  . simvastatin  40 mg Per Tube Daily  . furosemide  40 mg Oral Daily  . insulin aspart  0-5 Units Subcutaneous QHS  . insulin aspart  0-6 Units Subcutaneous TID WC  . mirtazapine  15 mg Oral QHS  . pantoprazole  40 mg Oral Daily  . sodium bicarbonate  1,300 mg Oral TID  . sodium chloride flush  3 mL Intravenous Q12H    BMET    Component Value Date/Time   NA 137 03/12/2019 0504   K 4.7 03/12/2019 0504   CL 101 03/12/2019 0504   CO2 21 (L) 03/12/2019 0504   GLUCOSE 140 (H) 03/12/2019 0504   BUN 58 (H) 03/12/2019 0504   CREATININE 4.30 (H) 03/12/2019 0504   CALCIUM 8.1 (L) 03/12/2019 0504   GFRNONAA 10 (L) 03/12/2019 0504   GFRAA 11 (L) 03/12/2019 0504   CBC    Component Value Date/Time   WBC 7.1 03/12/2019 0504   RBC 2.43 (L) 03/12/2019 0504   HGB 7.6 (L) 03/12/2019 0504   HCT 22.7 (L) 03/12/2019 0504   PLT 236 03/12/2019 0504   MCV 93.4 03/12/2019 0504   MCH 31.3 03/12/2019 0504   MCHC 33.5 03/12/2019 0504   RDW 12.8 03/12/2019 0504   LYMPHSABS 1.1 03/10/2019 0307    MONOABS 0.9 03/10/2019 0307   EOSABS 0.0 03/10/2019 0307   BASOSABS 0.0 03/10/2019 0307     Assessment/Plan:  1. AKI/CKD stage 4- hemodynamically mediated in setting of decompensated CHF, hypotension, and diuresis.  Cr peaked at 4.69 and improved slightly to 4.3 today.  No uremic symptoms.  She admitted that she would agree to HD "If I had to".  Continue to follow UOP and Scr for now.  No indications for HD at this time 2. Left MCA CVA- s/p thrombectomy/revascullarization by IR.  No anticoagulation due to subdural hematoma 3. Acute on chronic diastolic CHF- improving. 4. HTN- stable 5. Subdural hematoma from fall- no anticoagulation  Donetta Potts, MD [facility] [phone]

## 2019-03-12 NOTE — Progress Notes (Addendum)
Progress Note  Patient Name: [name] Date of Encounter: 03/12/2019  Primary Cardiologist: Lauree Chandler, MD   Subjective   Doing well this morning. Denies chest pain, shortness of breath, or palpitations. Feels extremely weak anytime she gets out of bed.   Inpatient Medications    Scheduled Meds: .  stroke: mapping our early stages of recovery book   Does not apply Once  . aspirin EC  81 mg Oral Daily  . carbamazepine  400 mg Oral BID  . Chlorhexidine Gluconate Cloth  6 each Topical Daily  . citalopram  20 mg Oral Daily  . ezetimibe  10 mg Oral Daily   And  . simvastatin  40 mg Per Tube Daily  . furosemide  40 mg Oral Daily  . insulin aspart  0-5 Units Subcutaneous QHS  . insulin aspart  0-6 Units Subcutaneous TID WC  . mirtazapine  15 mg Oral QHS  . pantoprazole  40 mg Oral Daily  . sodium bicarbonate  1,300 mg Oral TID  . sodium chloride flush  3 mL Intravenous Q12H   Continuous Infusions: . sodium chloride     PRN Meds: sodium chloride, acetaminophen **OR** acetaminophen (TYLENOL) oral liquid 160 mg/5 mL **OR** acetaminophen, acetaminophen, hydrALAZINE, nitroGLYCERIN, ondansetron (ZOFRAN) IV, sodium chloride flush   Vital Signs    Vitals:   03/11/19 1953 03/11/19 2318 03/12/19 0343 03/12/19 0348  BP: 128/80 117/62 124/60   Pulse:  69 67   Resp: 11 16 15    Temp: 98.6 F (37 C) 99.2 F (37.3 C) 98.9 F (37.2 C)   TempSrc: Oral Oral Oral   SpO2: 95% 95% 95%   Weight:    112.2 kg  Height:        Intake/Output Summary (Last 24 hours) at 03/12/2019 0800 Last data filed at 03/12/2019 0346 Gross per 24 hour  Intake 200 ml  Output 550 ml  Net -350 ml   Last 3 Weights 03/12/2019 03/11/2019 03/10/2019  Weight (lbs) 247 lb 5.7 oz 246 lb 7.6 oz 242 lb 8.1 oz  Weight (kg) 112.2 kg 111.8 kg 110 kg      Telemetry    Junctional rhythm converted to NSR overnight; 2.5 second pause - Personally Reviewed  ECG    No new tracings - Personally  Reviewed  Physical Exam   GEN: chronically ill appearing, significantly deconditioned, NAD Neck: No JVD Cardiac: RRR, no murmurs, rubs, or gallops.  Respiratory: normal work of breathing; Clear to auscultation bilaterally. GI: Soft, nontender, non-distended  MS: No edema; No deformity. Neuro:  Nonfocal  Psych: Normal affect   Labs    High Sensitivity Troponin:   Recent Labs  Lab 03/04/19 0954 03/04/19 1154 03/04/19 1557 03/04/19 1809  TROPONINIHS 483* 495* 456* 431*      Chemistry Recent Labs  Lab 03/09/19 2246 03/09/19 2246 03/10/19 0307 03/11/19 0455 03/12/19 0504  NA 139   < > 139 138 137  K 4.2   < > 4.1 4.3 4.7  CL 106   < > 105 103 101  CO2 19*   < > 20* 22 21*  GLUCOSE 209*   < > 163* 175* 140*  BUN 50*   < > 51* 57* 58*  CREATININE 3.90*   < > 4.00* 4.69* 4.30*  CALCIUM 8.2*   < > 8.5* 8.2* 8.1*  PROT 6.0*  --   --  6.2* 5.8*  ALBUMIN 2.7*   < > 2.9* 2.7* 2.6*  AST 21  --   --  19 19  ALT 14  --   --  13 15  ALKPHOS 81  --   --  81 77  BILITOT 0.6  --   --  0.7 0.8  GFRNONAA 11*   < > 11* 9* 10*  GFRAA 13*   < > 12* 10* 11*  ANIONGAP 14   < > 14 13 15    < > = values in this interval not displayed.     Hematology Recent Labs  Lab 03/10/19 0307 03/11/19 0455 03/12/19 0504  WBC 6.2 7.5 7.1  RBC 2.76* 2.44* 2.43*  HGB 8.9* 7.7* 7.6*  HCT 26.4* 23.2* 22.7*  MCV 95.7 95.1 93.4  MCH 32.2 31.6 31.3  MCHC 33.7 33.2 33.5  RDW 13.2 12.8 12.8  PLT 182 223 236    BNPNo results for input(s): BNP, PROBNP in the last 168 hours.   DDimer  Recent Labs  Lab 03/06/19 1013  DDIMER 2.20*     Radiology    No results found.  Cardiac Studies   TTE 03/07/19 1. Left ventricular ejection fraction, by estimation, is 65 to 70%. The  left ventricle has normal function. The left ventrical has no regional  wall motion abnormalities. Left ventricular diastolic function could not  be evaluated. LV filling pressures  not assessed. No evidence of LV apical  thrombus by definity contrast.  2. Right ventricular systolic function was not well visualized. The right  ventricular size is not well visualized.  3. The mitral valve is normal in structure and function.  4. The aortic valve was not assessed.  Patient Profile   74 y.o.femalewith hx of HFpEF, obesity, HTN, HLD, DM, CKD stage IV, bradycardia whowas admitted for heart failure exacerbation. Hospital course complicated by acute left ischemic stroke secondary to M1 occlusion. She underwent successful mechanical thrombectomy on 03/07/19  Assessment & Plan    Junctional rhythm -back in NSR this morning with rates in 60s  -no indication for pacemaker  Acute CVA 2/2 M1 occlusion s/p mechanical thrombectomy Right-sided subdural hematoma  -unclear etiology; repeat echo without evidence of LV thrombus; no DVT in her lower extremities. No AF on telemetry  -on aspirin 81 mg, as she is not a candidate for DAPT given her SDH -given that she is not a candidate for anticoagulation, would not recommend loop recorder  Acute on chronic diastolic HF -diuresis limited by renal failure -nephrology following and managing diuresis -continuing PO lasix 40 daily  -continue strict I&Os; daily weights  AKI on CKD IV -Cr appears to have peaked at 4.6; now trending back down -no acute indication for HD -continuing Lasix as above   Hypotension  -blood pressure improved with holding home antihypertensives  -BP goal <160 with SDH        For questions or updates, please contact [facility] Please consult [email or web address] for contact info under      Signed, Delice Bison, DO  03/12/2019, 8:00 AM    I have examined the patient and reviewed assessment and plan and discussed with patient.  Agree with above as stated.    Heart rhythm stable. No lightheadedness.  Will need PT to help build strength.  LE edema improved.   Larae Grooms

## 2019-03-12 NOTE — Progress Notes (Signed)
Oldest daughter [name] [phone] would like updates from MD communicated at this phone number.

## 2019-03-12 NOTE — Progress Notes (Signed)
Daughter [name] called and said she would like to be updated by MD if possible because mom (pt) told her that she was going to have to have dialysis and also she was leaving today.  This nurse informed her that was not the case but explained the dialysis conversation, as was in the chart, and also that they were saying that she may need subacute rehabe or SNF upon discharge and that the discharge planning discussion starts on admission and that she is not going anywhere today.  She said please keep her informed so she does not have to hear this from her mom, who got the info mixed up.

## 2019-03-12 NOTE — Progress Notes (Signed)
Pt refuses to wear CPAP.  °

## 2019-03-12 NOTE — Progress Notes (Signed)
Physical Therapy Treatment Patient Details Name: [name] MRN: [id number] DOB: May 18, 1945 Today's Date: 03/12/2019    History of Present Illness Patient is a 74 y/o female who presents with dyspnea on exertion and multiple falls at home. Admitted with acute on chronic CHF. PMH includes CHF, thoracic scoliosis, CKD stage 4, obesity, HTN, DVT, seizures, DM.    PT Comments    Pt remains unmotivated to progress and required max cues for encouragement.  She is largely limited due to pain in LLE, she pointed to calf but unclear of the region.  She remains fatigued and deconditioned.  Continue to recommend snf placement to improve strength and function before pt returns home.    Follow Up Recommendations  SNF;Supervision for mobility/OOB     Equipment Recommendations  None recommended by PT    Recommendations for Other Services       Precautions / Restrictions Precautions Precautions: Fall Precaution Comments: 3 falls prior to admission Restrictions Weight Bearing Restrictions: No    Mobility  Bed Mobility Overal bed mobility: Needs Assistance Bed Mobility: Supine to Sit Rolling: Min guard;Mod assist(min guard for 90 % then required moderate assistance to scoot to edge of bed with bed pad to prepare for standing trials.)         General bed mobility comments: Pt able to move to edge of bed with out physical assistance and heavy use of bed rails.  Once in sitting require moderate assistance to move to edge of bed.  Transfers Overall transfer level: Needs assistance Equipment used: Rolling walker (2 wheeled) Transfers: Sit to/from Stand Sit to Stand: Mod assist         General transfer comment: Performed multiple transfers with cues for hand placement to and from seated surface.  She required moderate assistance to boost into standing.  Ambulation/Gait Ambulation/Gait assistance: Mod assist;+2 safety/equipment Gait Distance (Feet): (side stepping along edge of bed to  move back to [facility].) Assistive device: Rolling walker (2 wheeled) Gait Pattern/deviations: Step-through pattern;Shuffle;Wide base of support     General Gait Details: pt guarded and with short shuffled steps.  Need RW assist even though she is used to using a RW, pt limited due to pain in LLE so difficult to sidestep.   Stairs             Wheelchair Mobility    Modified Rankin (Stroke Patients Only)       Balance Overall balance assessment: Needs assistance;History of Falls Sitting-balance support: Feet supported;Single extremity supported;No upper extremity supported Sitting balance-Leahy Scale: Fair Sitting balance - Comments: Supervision for safety.     Standing balance-Leahy Scale: Poor Standing balance comment: reliant on UE support on an AD and min assist for dynamic stability presently                            Cognition Arousal/Alertness: Awake/alert Behavior During Therapy: WFL for tasks assessed/performed Overall Cognitive Status: Impaired/Different from baseline Area of Impairment: Orientation;Attention;Memory;Safety/judgement;Following commands;Awareness;Problem solving                 Orientation Level: Disoriented to;Situation Current Attention Level: Selective Memory: Decreased short-term memory Following Commands: Follows one step commands with increased time Safety/Judgement: Decreased awareness of safety;Decreased awareness of deficits Awareness: Emergent Problem Solving: Decreased initiation;Slow processing;Difficulty sequencing;Requires verbal cues;Requires tactile cues General Comments: pt with overall decreased awareness of deficits, STM, and executive functioning      Exercises      General Comments  Pertinent Vitals/Pain Pain Assessment: Faces Faces Pain Scale: Hurts even more Pain Location: L calf Pain Descriptors / Indicators: Sore;Aching Pain Intervention(s): Monitored during session;Repositioned     Home Living                      Prior Function            PT Goals (current goals can now be found in the care plan section) Acute Rehab PT Goals Patient Stated Goal: to stop falling Potential to Achieve Goals: Good Progress towards PT goals: Progressing toward goals    Frequency    Min 3X/week      PT Plan Current plan remains appropriate    Co-evaluation              AM-PAC PT "6 Clicks" Mobility   Outcome Measure  Help needed turning from your back to your side while in a flat bed without using bedrails?: A Little Help needed moving from lying on your back to sitting on the side of a flat bed without using bedrails?: A Little Help needed moving to and from a bed to a chair (including a wheelchair)?: A Lot Help needed standing up from a chair using your arms (e.g., wheelchair or bedside chair)?: A Lot Help needed to walk in hospital room?: A Lot Help needed climbing 3-5 steps with a railing? : A Lot 6 Click Score: 14    End of Session Equipment Utilized During Treatment: Gait belt Activity Tolerance: Patient tolerated treatment well Patient left: in bed;with call bell/phone within reach;with family/visitor present Nurse Communication: Mobility status PT Visit Diagnosis: Muscle weakness (generalized) (M62.81);Unsteadiness on feet (R26.81);Pain Pain - Right/Left: Left Pain - part of body: Hip     Time: 5183-4373 PT Time Calculation (min) (ACUTE ONLY): 24 min  Charges:  $Therapeutic Activity: 23-37 mins                     Erasmo Leventhal , PTA Acute Rehabilitation Services Pager [phone] Office [phone]     [name] 03/12/2019, 4:01 PM

## 2019-03-13 DIAGNOSIS — R778 Other specified abnormalities of plasma proteins: Secondary | ICD-10-CM

## 2019-03-13 DIAGNOSIS — R001 Bradycardia, unspecified: Secondary | ICD-10-CM

## 2019-03-13 DIAGNOSIS — N189 Chronic kidney disease, unspecified: Secondary | ICD-10-CM

## 2019-03-13 LAB — RENAL FUNCTION PANEL
Albumin: 2.5 g/dL — ABNORMAL LOW (ref 3.5–5.0)
Anion gap: 11 (ref 5–15)
BUN: 54 mg/dL — ABNORMAL HIGH (ref 8–23)
CO2: 25 mmol/L (ref 22–32)
Calcium: 8.2 mg/dL — ABNORMAL LOW (ref 8.9–10.3)
Chloride: 103 mmol/L (ref 98–111)
Creatinine, Ser: 3.8 mg/dL — ABNORMAL HIGH (ref 0.44–1.00)
GFR calc Af Amer: 13 mL/min — ABNORMAL LOW (ref >60)
GFR calc non Af Amer: 11 mL/min — ABNORMAL LOW (ref >60)
Glucose, Bld: 151 mg/dL — ABNORMAL HIGH (ref 70–99)
Phosphorus: 3.7 mg/dL (ref 2.5–4.6)
Potassium: 4.1 mmol/L (ref 3.5–5.1)
Sodium: 139 mmol/L (ref 135–145)

## 2019-03-13 LAB — GLUCOSE, CAPILLARY
Glucose-Capillary: 130 mg/dL — ABNORMAL HIGH (ref 70–99)
Glucose-Capillary: 209 mg/dL — ABNORMAL HIGH (ref 70–99)
Glucose-Capillary: 204 mg/dL — ABNORMAL HIGH (ref 70–99)
Glucose-Capillary: 210 mg/dL — ABNORMAL HIGH (ref 70–99)

## 2019-03-13 LAB — CBC
HCT: 23.4 % — ABNORMAL LOW (ref 36.0–46.0)
Hemoglobin: 7.7 g/dL — ABNORMAL LOW (ref 12.0–15.0)
MCH: 31.6 pg (ref 26.0–34.0)
MCHC: 32.9 g/dL (ref 30.0–36.0)
MCV: 95.9 fL (ref 80.0–100.0)
Platelets: 255 10*3/uL (ref 150–400)
RBC: 2.44 MIL/uL — ABNORMAL LOW (ref 3.87–5.11)
RDW: 12.8 % (ref 11.5–15.5)
WBC: 6.4 10*3/uL (ref 4.0–10.5)
nRBC: 0 % (ref 0.0–0.2)

## 2019-03-13 LAB — SARS CORONAVIRUS 2 (TAT 6-24 HRS): SARS Coronavirus 2: NEGATIVE

## 2019-03-13 LAB — MAGNESIUM: Magnesium: 1.9 mg/dL (ref 1.7–2.4)

## 2019-03-13 NOTE — Progress Notes (Signed)
Physical Therapy Treatment Patient Details Name: [name] MRN: [id number] DOB: January 31, 1945 Today's Date: 03/13/2019    History of Present Illness Patient is a 74 y/o female who presents with dyspnea on exertion and multiple falls at home. Admitted with acute on chronic CHF. PMH includes CHF, thoracic scoliosis, CKD stage 4, obesity, HTN, DVT, seizures, DM.    PT Comments    Pt performed gt training short bouts from bed to commode and commode to recliner.  Pt with decreased strength and functional capacity.  She continues to benefit from skilled rehab at [facility] before returning home.      Follow Up Recommendations  SNF;Supervision for mobility/OOB     Equipment Recommendations  None recommended by PT    Recommendations for Other Services       Precautions / Restrictions Precautions Precautions: Fall Precaution Comments: 3 falls prior to admission Restrictions Weight Bearing Restrictions: No    Mobility  Bed Mobility Overal bed mobility: Needs Assistance Bed Mobility: Supine to Sit Rolling: Min assist   Supine to sit: Mod assist;+2 for physical assistance     General bed mobility comments: Pt required assistance to advance LEs to edge of bed and elevate trunk into a seated position.  She required assistance to scoot to edge of bed with use of bed pad.  Transfers Overall transfer level: Needs assistance Equipment used: Rolling walker (2 wheeled) Transfers: Sit to/from Stand Sit to Stand: Mod assist;+2 physical assistance         General transfer comment: Cues for hand placement to and from seated surface.  External assistance to rise into standing.  Ambulation/Gait Ambulation/Gait assistance: Mod assist;+2 safety/equipment Gait Distance (Feet): 4 Feet(ft x2) Assistive device: Rolling walker (2 wheeled) Gait Pattern/deviations: Step-through pattern;Shuffle;Wide base of support Gait velocity: decreased   General Gait Details: Pt with poor foot clearance and  wide BOS.  Shuffling steps from surface to surface.   Stairs             Wheelchair Mobility    Modified Rankin (Stroke Patients Only)       Balance Overall balance assessment: Needs assistance Sitting-balance support: Feet supported;Single extremity supported;No upper extremity supported Sitting balance-Leahy Scale: Fair Sitting balance - Comments: Supervision for safety.   Standing balance support: During functional activity Standing balance-Leahy Scale: Poor Standing balance comment: reliant on UE support on an AD and min assist for dynamic stability presently                            Cognition Arousal/Alertness: Awake/alert Behavior During Therapy: WFL for tasks assessed/performed Overall Cognitive Status: Impaired/Different from baseline Area of Impairment: Orientation;Attention;Memory;Safety/judgement;Following commands;Awareness;Problem solving                 Orientation Level: Disoriented to;Situation Current Attention Level: Selective Memory: Decreased short-term memory Following Commands: Follows one step commands with increased time Safety/Judgement: Decreased awareness of safety;Decreased awareness of deficits Awareness: Emergent Problem Solving: Decreased initiation;Slow processing;Difficulty sequencing;Requires verbal cues;Requires tactile cues General Comments: verbalized need to void but does not express need to get to bed pan or 3n1. pt needed initiation by therapist to engage in use of 3n1      Exercises Other Exercises Other Exercises: music played to help motivate patient and pt laughing/ smiling during session    General Comments General comments (skin integrity, edema, etc.): pt fatigued with sit<>Stand for peri and taking steps to chair      Pertinent Vitals/Pain Pain  Assessment: No/denies pain    Home Living                      Prior Function            PT Goals (current goals can now be found in the  care plan section) Acute Rehab PT Goals Patient Stated Goal: To use the bathroom Potential to Achieve Goals: Good Progress towards PT goals: Progressing toward goals    Frequency    Min 3X/week      PT Plan Current plan remains appropriate    Co-evaluation   Reason for Co-Treatment: Complexity of the patient's impairments (multi-system involvement);For patient/therapist safety;Necessary to address cognition/behavior during functional activity;To address functional/ADL transfers   OT goals addressed during session: ADL's and self-care;Proper use of Adaptive equipment and DME;Strengthening/ROM      AM-PAC PT "6 Clicks" Mobility   Outcome Measure  Help needed turning from your back to your side while in a flat bed without using bedrails?: A Little Help needed moving from lying on your back to sitting on the side of a flat bed without using bedrails?: A Little Help needed moving to and from a bed to a chair (including a wheelchair)?: A Lot Help needed standing up from a chair using your arms (e.g., wheelchair or bedside chair)?: A Lot Help needed to walk in hospital room?: A Lot Help needed climbing 3-5 steps with a railing? : A Lot 6 Click Score: 14    End of Session Equipment Utilized During Treatment: Gait belt Activity Tolerance: Patient tolerated treatment well Patient left: in bed;with call bell/phone within reach;with family/visitor present Nurse Communication: Mobility status PT Visit Diagnosis: Muscle weakness (generalized) (M62.81);Unsteadiness on feet (R26.81);Pain Pain - Right/Left: Left Pain - part of body: Hip     Time: 3646-8032 PT Time Calculation (min) (ACUTE ONLY): 29 min  Charges:  $Therapeutic Activity: 8-22 mins                     Erasmo Leventhal , PTA Acute Rehabilitation Services Pager [phone] Office [phone]     Alila Sotero Eli Hose 03/13/2019, 3:57 PM

## 2019-03-13 NOTE — Progress Notes (Addendum)
Progress Note  Patient Name: [name] Date of Encounter: 03/13/2019  Primary Cardiologist: Lauree Chandler, MD   Subjective   Sleeping through most of exam this morning, but denies and shortness of breath or chest pain.   Inpatient Medications    Scheduled Meds: .  stroke: mapping our early stages of recovery book   Does not apply Once  . aspirin EC  81 mg Oral Daily  . carbamazepine  400 mg Oral BID  . Chlorhexidine Gluconate Cloth  6 each Topical Daily  . citalopram  20 mg Oral Daily  . ezetimibe  10 mg Oral Daily   And  . simvastatin  40 mg Per Tube Daily  . furosemide  40 mg Oral Daily  . insulin aspart  0-5 Units Subcutaneous QHS  . insulin aspart  0-6 Units Subcutaneous TID WC  . mirtazapine  15 mg Oral QHS  . pantoprazole  40 mg Oral Daily  . sodium bicarbonate  1,300 mg Oral TID  . sodium chloride flush  3 mL Intravenous Q12H   Continuous Infusions: . sodium chloride     PRN Meds: sodium chloride, acetaminophen **OR** acetaminophen (TYLENOL) oral liquid 160 mg/5 mL **OR** acetaminophen, acetaminophen, hydrALAZINE, nitroGLYCERIN, ondansetron (ZOFRAN) IV, sodium chloride flush   Vital Signs    Vitals:   03/13/19 0323 03/13/19 0326 03/13/19 0353 03/13/19 0734  BP:  129/81    Pulse:  69    Resp:  17    Temp:  98.2 F (36.8 C)  98.3 F (36.8 C)  TempSrc:  Oral  Oral  SpO2:  97% 98%   Weight: 111.8 kg     Height:        Intake/Output Summary (Last 24 hours) at 03/13/2019 0811 Last data filed at 03/13/2019 0353 Gross per 24 hour  Intake 240 ml  Output 1625 ml  Net -1385 ml   Last 3 Weights 03/13/2019 03/12/2019 03/11/2019  Weight (lbs) 246 lb 7.6 oz 247 lb 5.7 oz 246 lb 7.6 oz  Weight (kg) 111.8 kg 112.2 kg 111.8 kg      Telemetry    NSR - Personally Reviewed  ECG    No new tracings - Personally Reviewed  Physical Exam   GEN: Chronically ill appearing, NAD   Neck: No JVD Cardiac: RRR, no murmurs, rubs, or gallops.  Respiratory:  Clear to auscultation bilaterally. GI: Soft, nontender, non-distended  MS: No edema; No deformity. Neuro:  Nonfocal  Psych: Normal affect   Labs    High Sensitivity Troponin:   Recent Labs  Lab 03/04/19 0954 03/04/19 1154 03/04/19 1557 03/04/19 1809  TROPONINIHS 483* 495* 456* 431*      Chemistry Recent Labs  Lab 03/09/19 2246 03/10/19 0307 03/11/19 0455 03/12/19 0504 03/13/19 0217  NA 139   < > 138 137 139  K 4.2   < > 4.3 4.7 4.1  CL 106   < > 103 101 103  CO2 19*   < > 22 21* 25  GLUCOSE 209*   < > 175* 140* 151*  BUN 50*   < > 57* 58* 54*  CREATININE 3.90*   < > 4.69* 4.30* 3.80*  CALCIUM 8.2*   < > 8.2* 8.1* 8.2*  PROT 6.0*  --  6.2* 5.8*  --   ALBUMIN 2.7*   < > 2.7* 2.6* 2.5*  AST 21  --  19 19  --   ALT 14  --  13 15  --   ALKPHOS 81  --  81 77  --   BILITOT 0.6  --  0.7 0.8  --   GFRNONAA 11*   < > 9* 10* 11*  GFRAA 13*   < > 10* 11* 13*  ANIONGAP 14   < > 13 15 11    < > = values in this interval not displayed.     Hematology Recent Labs  Lab 03/11/19 0455 03/12/19 0504 03/13/19 0217  WBC 7.5 7.1 6.4  RBC 2.44* 2.43* 2.44*  HGB 7.7* 7.6* 7.7*  HCT 23.2* 22.7* 23.4*  MCV 95.1 93.4 95.9  MCH 31.6 31.3 31.6  MCHC 33.2 33.5 32.9  RDW 12.8 12.8 12.8  PLT 223 236 255    BNPNo results for input(s): BNP, PROBNP in the last 168 hours.   DDimer  Recent Labs  Lab 03/06/19 1013  DDIMER 2.20*     Radiology    No results found.  Cardiac Studies   TTE 03/07/19 1. Left ventricular ejection fraction, by estimation, is 65 to 70%. The  left ventricle has normal function. The left ventrical has no regional  wall motion abnormalities. Left ventricular diastolic function could not  be evaluated. LV filling pressures  not assessed. No evidence of LV apical thrombus by definity contrast.  2. Right ventricular systolic function was not well visualized. The right  ventricular size is not well visualized.  3. The mitral valve is normal in structure  and function.  4. The aortic valve was not assessed.  Patient Profile     74 y.o.femalewith hx of HFpEF, obesity, HTN, HLD, DM, CKD stage IV, bradycardia whowas admitted for heart failure exacerbation. Hospital course complicated by acute left ischemic stroke secondary to M1 occlusion. She underwent successful mechanical thrombectomy on 03/07/19  Assessment & Plan    Junctional rhythm -continues to be in NSR with intermittent bradycardic rates; no issues with lightheadedness or syncope -no indication for pacemaker  Acute CVA 2/2 M1 occlusion s/p mechanical thrombectomy Right-sided subdural hematoma  -unclear etiology; repeat echo without evidence of LV thrombus; no DVT in her lower extremities. No AF on telemetry  -on aspirin 81 mg, as she is not a candidate for DAPT given her SDH -given that she is not a candidate for anticoagulation, would not recommend loop recorder -will need acute rehab for deconditioning   Acute on chronic diastolic HF -diuresis has been limited by renal failure -nephrology following and managing diuresis -having good output on PO lasix 40 dailyand appears euvolemic on exam -continue strict I&Os; daily weights  AKI on CKD IV -Cr peaked at 4.6; continues to trend down, 3.8 today -no acute indication for HD -continuing Lasix as above  Hypotension  -blood pressure improved with holding home antihypertensives  -BP goal<160 with SDH     For questions or updates, please contact [facility] Please consult [email or web address] for contact info under        Signed, Delice Bison, DO  03/13/2019, 8:11 AM    I have examined the patient and reviewed assessment and plan and discussed with patient.  Agree with above as stated.    HR stable.  Avoid beta blockers.  COntinue with secondary prevention for vascular disease.   Will sign off. Please call with questions.   Larae Grooms

## 2019-03-13 NOTE — TOC Progression Note (Signed)
[facility] of [facility]) - Progression Note    Patient Details  Name: [name] MRN: [id number] Date of Birth: 1945/09/19  [facility] CM/SW Contact  Pollie Friar, RN Phone Number: 03/13/2019, 3:02 PM  Clinical Narrative:    Plan is for SNF tomorrow at [facility]. CM confirmed they still have bed for patient. Started new auth with [employer] for [facility]. MD to order covid.  TOC following.   Expected Discharge Plan: [place] Barriers to Discharge: Continued Medical Work up  Expected Discharge Plan and Services Expected Discharge Plan: [place] Choice: [place] arrangements for the past 2 months: Single Family Home                                       Social Determinants of Health (SDOH) Interventions    Readmission Risk Interventions No flowsheet data found.

## 2019-03-13 NOTE — Progress Notes (Addendum)
PROGRESS NOTE    [name]  VWU:[id number] DOB: 05/28/1945 DOA: 03/04/2019  PCP: Paula Mccreedy, MD     Brief Narrative:  [name] is an 74 y.o. female with h/o DM; seizures; OSA; morbid obesity (BMI 46); HTN; HLD: stage 4 CKD; chronic diastolic CHF.  She was admitted on 2/7 to the cardiology team with acute on chronic diastolic CHF and an elevated troponin.  She also complained of leg weakness and falling.  She was found to have a L MCA infarct, s/p thrombectomy and started on ASA; she is not a candidate for DAPT or AC due to SDH from her fall.  She had post-operative respiratory failure and remained intubated but is now extubated and is recommended to have CPAP qhs given h/o OSA.   Assessment & Plan:   Principal Problem:   Middle cerebral artery embolism, left Active Problems:   Hyperlipidemia   OSA (obstructive sleep apnea)   Essential hypertension   Junctional bradycardia   Seizures disorder   Diabetes mellitus type 2, insulin dependent (HCC)   Morbid obesity with BMI of 45.0-49.9, adult (HCC)   CKD (chronic kidney disease) stage 4, GFR 15-29 ml/min (HCC)   Acute hyperkalemia   Acute on chronic diastolic CHF (congestive heart failure) ([place])  Clinical problems list 1.  Acute exacerbation of chronic heart failure with preserved ejection fraction/junctional bradycardia 2.  Acute CVA/subdural hematoma 3.  Chronic kidney disease stage IV 4.  Hyperkalemia 5.  Essential hypertension 6.  Hyperlipidemia 7.  Diabetes mellitus type 2 8.  History of seizure disorder 9.  Obstructive sleep apnea 10.  Morbid obesity, BMI 44.3  1.  Acute exacerbation of chronic heart failure with preserved ejection fraction/junctional bradycardia.   Limited echocardiogram showed ejection fraction 65 to 70%, normal left ventricular systolic function and no wall motion abnormalities.  Cardiology following and does not think patient requires pacemaker at this stage due to junctional bradycardia.   Diuresis limited due to renal failure.  Nephrology following and leading diuresis.  Patient remains on Lasix 40 mg p.o. daily.  Strict I's and O's and daily weights.  Cardiology on board as well.  2.  Acute CVA/subdural hematoma.  Status post thrombectomy revascularization by interventional radiology.  Subdural hematoma from previous fall. Continue with aspirin.  No anticoagulation in the setting of subdural hematoma and history of recurrent falls. Last seen by PT OT several days ago.  Recommendation for SNF.  Will reconsult them today.  3.  Chronic kidney disease stage IV (eGFR 15-29).  Looks like creatinine has peaked at 4.69 on 03/11/2019.  Is improving for last 2 days.  Nephrology following.  No indication of dialysis.  Appreciate their help.   4.  Hyperkalemia.  Resolved.  5.  Essential hypertension.  Blood pressure better and within normal range. Continue to hold all antihypertensive medication and monitor blood pressure closely  6.  Hyperlipidemia Continue with ezetimibe/simvastatin  7.  Diabetes mellitus type 2.  Hemoglobin A1c of 5.6.  Controlled.  Continue SSI.  8.  History of seizure disorder Continue with Tegretol  9.  Obstructive sleep apnea CPAP at night and as needed  10.  Morbid obesity with BMI 44.3 Dietary and lifestyle modification  DVT prophylaxis: SCD/avoiding heparin products due to subdural hematoma Code Status: Full code  Family Communication: None at bedside.  Saw the note documented by the nurse at 10:06 PM last night "Oldest daughter [name] [phone] would like updates from MD communicated at this phone number".  I called at this number and left a voicemail.  The daughter called me back and I updated her about the plans of care, progressing renal function and possible discharge to SNF tomorrow.  She requested a call back by MD tomorrow. Disposition Plan: SNF. Discharge barrier:  Insurance authorization, Covid test results.  Potential discharge  tomorrow. Patient is from: Home   Consultants:   Cardiology  Nephrology  Procedures: Left MCA thrombectomy by IR   Antimicrobials: None   Subjective: Patient seen and examined.  She is alert and oriented.  Eating breakfast.  No complaints.  Objective: Vitals:   03/13/19 0326 03/13/19 0353 03/13/19 0734 03/13/19 1114  BP: 129/81   128/65  Pulse: 69   70  Resp: 17   16  Temp: 98.2 F (36.8 C)  98.3 F (36.8 C) 99.2 F (37.3 C)  TempSrc: Oral  Oral Oral  SpO2: 97% 98%  90%  Weight:      Height:        Intake/Output Summary (Last 24 hours) at 03/13/2019 1402 Last data filed at 03/13/2019 0353 Gross per 24 hour  Intake --  Output 825 ml  Net -825 ml   Filed Weights   03/11/19 0315 03/12/19 0348 03/13/19 0323  Weight: 111.8 kg 112.2 kg 111.8 kg    Examination:  General exam: Appears calm and comfortable  Respiratory system: Clear to auscultation. Respiratory effort normal. Cardiovascular system: S1 & S2 heard, RRR. No JVD, murmurs, rubs, gallops or clicks.  Trace pitting edema bilateral lower extremity Gastrointestinal system: Abdomen is nondistended, soft and nontender. No organomegaly or masses felt. Normal bowel sounds heard. Central nervous system: Alert and oriented. No focal neurological deficits. Extremities: Symmetric 5 x 5 power. Skin: No rashes, lesions or ulcers.  Psychiatry: Judgement and insight appear normal. Mood & affect appropriate.    Data Reviewed: I have personally reviewed following labs and imaging studies  CBC: Recent Labs  Lab 03/08/19 0545 03/10/19 0307 03/11/19 0455 03/12/19 0504 03/13/19 0217  WBC 7.4 6.2 7.5 7.1 6.4  NEUTROABS 6.3 4.1  --   --   --   HGB 8.4* 8.9* 7.7* 7.6* 7.7*  HCT 25.5* 26.4* 23.2* 22.7* 23.4*  MCV 95.1 95.7 95.1 93.4 95.9  PLT 198 182 223 236 335   Basic Metabolic Panel: Recent Labs  Lab 03/09/19 1709 03/09/19 1709 03/09/19 2246 03/10/19 0307 03/11/19 0455 03/12/19 0504 03/13/19 0217  NA 139    < > 139 139 138 137 139  K 4.8   < > 4.2 4.1 4.3 4.7 4.1  CL 111   < > 106 105 103 101 103  CO2 14*   < > 19* 20* 22 21* 25  GLUCOSE 212*   < > 209* 163* 175* 140* 151*  BUN 51*   < > 50* 51* 57* 58* 54*  CREATININE 3.69*   < > 3.90* 4.00* 4.69* 4.30* 3.80*  CALCIUM 8.5*   < > 8.2* 8.5* 8.2* 8.1* 8.2*  MG  --   --   --   --  2.1 2.1 1.9  PHOS 5.5*  --   --  6.3* 5.8* 5.1* 3.7   < > = values in this interval not displayed.   GFR: Estimated Creatinine Clearance: 16 mL/min (A) (by C-G formula based on SCr of 3.8 mg/dL (H)). Liver Function Tests: Recent Labs  Lab 03/09/19 2246 03/10/19 0307 03/11/19 0455 03/12/19 0504 03/13/19 0217  AST 21  --  19 19  --  ALT 14  --  13 15  --   ALKPHOS 81  --  81 77  --   BILITOT 0.6  --  0.7 0.8  --   PROT 6.0*  --  6.2* 5.8*  --   ALBUMIN 2.7* 2.9* 2.7* 2.6* 2.5*   No results for input(s): LIPASE, AMYLASE in the last 168 hours. No results for input(s): AMMONIA in the last 168 hours. Coagulation Profile: No results for input(s): INR, PROTIME in the last 168 hours. Cardiac Enzymes: No results for input(s): CKTOTAL, CKMB, CKMBINDEX, TROPONINI in the last 168 hours. BNP (last 3 results) No results for input(s): PROBNP in the last 8760 hours. HbA1C: No results for input(s): HGBA1C in the last 72 hours. CBG: Recent Labs  Lab 03/12/19 1206 03/12/19 1619 03/12/19 2123 03/13/19 0617 03/13/19 1115  GLUCAP 153* 176* 218* 130* 209*   Lipid Profile: No results for input(s): CHOL, HDL, LDLCALC, TRIG, CHOLHDL, LDLDIRECT in the last 72 hours. Thyroid Function Tests: No results for input(s): TSH, T4TOTAL, FREET4, T3FREE, THYROIDAB in the last 72 hours. Anemia Panel: No results for input(s): VITAMINB12, FOLATE, FERRITIN, TIBC, IRON, RETICCTPCT in the last 72 hours. Sepsis Labs: No results for input(s): PROCALCITON, LATICACIDVEN in the last 168 hours.  Recent Results (from the past 240 hour(s))  Respiratory Panel by RT PCR (Flu A&B, Covid) -  Nasopharyngeal Swab     Status: None   Collection Time: 03/04/19 12:05 PM   Specimen: Nasopharyngeal Swab  Result Value Ref Range Status   SARS Coronavirus 2 by RT PCR NEGATIVE NEGATIVE Final    Comment: (NOTE) SARS-CoV-2 target nucleic acids are NOT DETECTED. The SARS-CoV-2 RNA is generally detectable in upper respiratoy specimens during the acute phase of infection. The lowest concentration of SARS-CoV-2 viral copies this assay can detect is 131 copies/mL. A negative result does not preclude SARS-Cov-2 infection and should not be used as the sole basis for treatment or other patient management decisions. A negative result may occur with  improper specimen collection/handling, submission of specimen other than nasopharyngeal swab, presence of viral mutation(s) within the areas targeted by this assay, and inadequate number of viral copies (<131 copies/mL). A negative result must be combined with clinical observations, patient history, and epidemiological information. The expected result is Negative. Fact Sheet for Patients:  PinkCheek.be Fact Sheet for Healthcare Providers:  GravelBags.it This test is not yet ap proved or cleared by the [place] FDA and  has been authorized for detection and/or diagnosis of SARS-CoV-2 by FDA under an Emergency Use Authorization (EUA). This EUA will remain  in effect (meaning this test can be used) for the duration of the COVID-19 declaration under Section 564(b)(1) of the Act, 21 U.S.C. section 360bbb-3(b)(1), unless the authorization is terminated or revoked sooner.    Influenza A by PCR NEGATIVE NEGATIVE Final   Influenza B by PCR NEGATIVE NEGATIVE Final    Comment: (NOTE) The Xpert Xpress SARS-CoV-2/FLU/RSV assay is intended as an aid in  the diagnosis of influenza from Nasopharyngeal swab specimens and  should not be used as a sole basis for treatment. Nasal washings and  aspirates  are unacceptable for Xpert Xpress SARS-CoV-2/FLU/RSV  testing. Fact Sheet for Patients: PinkCheek.be Fact Sheet for Healthcare Providers: GravelBags.it This test is not yet approved or cleared by the [place] FDA and  has been authorized for detection and/or diagnosis of SARS-CoV-2 by  FDA under an Emergency Use Authorization (EUA). This EUA will remain  in effect (meaning this test can  be used) for the duration of the  Covid-19 declaration under Section 564(b)(1) of the Act, 21  U.S.C. section 360bbb-3(b)(1), unless the authorization is  terminated or revoked. Performed at [facility], [place] [address], [place], [place] [address]   MRSA PCR Screening     Status: None   Collection Time: 03/07/19 10:40 AM   Specimen: Nasopharyngeal  Result Value Ref Range Status   MRSA by PCR NEGATIVE NEGATIVE Final    Comment:        The GeneXpert MRSA Assay (FDA approved for NASAL specimens only), is one component of a comprehensive MRSA colonization surveillance program. It is not intended to diagnose MRSA infection nor to guide or monitor treatment for MRSA infections. Performed at [facility], [place] [address], [place],  [address]     Radiology Studies: No results found.  Scheduled Meds: .  stroke: mapping our early stages of recovery book   Does not apply Once  . aspirin EC  81 mg Oral Daily  . carbamazepine  400 mg Oral BID  . Chlorhexidine Gluconate Cloth  6 each Topical Daily  . citalopram  20 mg Oral Daily  . ezetimibe  10 mg Oral Daily   And  . simvastatin  40 mg Per Tube Daily  . furosemide  40 mg Oral Daily  . insulin aspart  0-5 Units Subcutaneous QHS  . insulin aspart  0-6 Units Subcutaneous TID WC  . mirtazapine  15 mg Oral QHS  . pantoprazole  40 mg Oral Daily  . sodium bicarbonate  1,300 mg Oral TID  . sodium chloride flush  3 mL Intravenous Q12H   Continuous Infusions: . sodium  chloride       LOS: 8 days   Time spent: 30 minutes.  Darliss Cheney, MD [facility] If 7PM-7AM, please contact night-coverage [email or web address] 03/13/2019, 2:02 PM

## 2019-03-13 NOTE — Progress Notes (Addendum)
Patient ID: [name], female   DOB: 1945-03-28, 74 y.o.   MRN: [id number] S: No new complaints O:BP 129/81 (BP Location: Left Wrist)   Pulse 69   Temp 98.3 F (36.8 C) (Oral)   Resp 17   Ht 5\' 2"  (1.575 m)   Wt 111.8 kg   SpO2 98%   BMI 45.08 kg/m   Intake/Output Summary (Last 24 hours) at 03/13/2019 1058 Last data filed at 03/13/2019 0353 Gross per 24 hour  Intake 240 ml  Output 1625 ml  Net -1385 ml   Intake/Output: I/O last 3 completed shifts: In: 480 [P.O.:480] Out: 2175 [Urine:2175]  Intake/Output this shift:  No intake/output data recorded. Weight change: -0.4 kg Gen: NAD CVS: no rub Resp: cta Abd: obese, +BS, soft, NT/ND Ext: trace lower ext edema  Recent Labs  Lab 03/09/19 0854 03/09/19 1709 03/09/19 2246 03/10/19 0307 03/11/19 0455 03/12/19 0504 03/13/19 0217  NA 137 139 139 139 138 137 139  K 6.0* 4.8 4.2 4.1 4.3 4.7 4.1  CL 111 111 106 105 103 101 103  CO2 12* 14* 19* 20* 22 21* 25  GLUCOSE 121* 212* 209* 163* 175* 140* 151*  BUN 50* 51* 50* 51* 57* 58* 54*  CREATININE 3.27* 3.69* 3.90* 4.00* 4.69* 4.30* 3.80*  ALBUMIN  --  2.8* 2.7* 2.9* 2.7* 2.6* 2.5*  CALCIUM 8.2* 8.5* 8.2* 8.5* 8.2* 8.1* 8.2*  PHOS  --  5.5*  --  6.3* 5.8* 5.1* 3.7  AST  --   --  21  --  19 19  --   ALT  --   --  14  --  13 15  --    Liver Function Tests: Recent Labs  Lab 03/09/19 2246 03/10/19 0307 03/11/19 0455 03/12/19 0504 03/13/19 0217  AST 21  --  19 19  --   ALT 14  --  13 15  --   ALKPHOS 81  --  81 77  --   BILITOT 0.6  --  0.7 0.8  --   PROT 6.0*  --  6.2* 5.8*  --   ALBUMIN 2.7*   < > 2.7* 2.6* 2.5*   < > = values in this interval not displayed.   No results for input(s): LIPASE, AMYLASE in the last 168 hours. No results for input(s): AMMONIA in the last 168 hours. CBC: Recent Labs  Lab 03/08/19 0545 03/08/19 0545 03/10/19 0307 03/10/19 0307 03/11/19 0455 03/12/19 0504 03/13/19 0217  WBC 7.4   < > 6.2   < > 7.5 7.1 6.4  NEUTROABS 6.3  --   4.1  --   --   --   --   HGB 8.4*   < > 8.9*   < > 7.7* 7.6* 7.7*  HCT 25.5*   < > 26.4*   < > 23.2* 22.7* 23.4*  MCV 95.1  --  95.7  --  95.1 93.4 95.9  PLT 198   < > 182   < > 223 236 255   < > = values in this interval not displayed.   Cardiac Enzymes: No results for input(s): CKTOTAL, CKMB, CKMBINDEX, TROPONINI in the last 168 hours. CBG: Recent Labs  Lab 03/12/19 0612 03/12/19 1206 03/12/19 1619 03/12/19 2123 03/13/19 0617  GLUCAP 134* 153* 176* 218* 130*    Iron Studies: No results for input(s): IRON, TIBC, TRANSFERRIN, FERRITIN in the last 72 hours. Studies/Results: No results found. .  stroke: mapping our early stages of recovery book  Does not apply Once  . aspirin EC  81 mg Oral Daily  . carbamazepine  400 mg Oral BID  . Chlorhexidine Gluconate Cloth  6 each Topical Daily  . citalopram  20 mg Oral Daily  . ezetimibe  10 mg Oral Daily   And  . simvastatin  40 mg Per Tube Daily  . furosemide  40 mg Oral Daily  . insulin aspart  0-5 Units Subcutaneous QHS  . insulin aspart  0-6 Units Subcutaneous TID WC  . mirtazapine  15 mg Oral QHS  . pantoprazole  40 mg Oral Daily  . sodium bicarbonate  1,300 mg Oral TID  . sodium chloride flush  3 mL Intravenous Q12H    BMET    Component Value Date/Time   NA 139 03/13/2019 0217   K 4.1 03/13/2019 0217   CL 103 03/13/2019 0217   CO2 25 03/13/2019 0217   GLUCOSE 151 (H) 03/13/2019 0217   BUN 54 (H) 03/13/2019 0217   CREATININE 3.80 (H) 03/13/2019 0217   CALCIUM 8.2 (L) 03/13/2019 0217   GFRNONAA 11 (L) 03/13/2019 0217   GFRAA 13 (L) 03/13/2019 0217   CBC    Component Value Date/Time   WBC 6.4 03/13/2019 0217   RBC 2.44 (L) 03/13/2019 0217   HGB 7.7 (L) 03/13/2019 0217   HCT 23.4 (L) 03/13/2019 0217   PLT 255 03/13/2019 0217   MCV 95.9 03/13/2019 0217   MCH 31.6 03/13/2019 0217   MCHC 32.9 03/13/2019 0217   RDW 12.8 03/13/2019 0217   LYMPHSABS 1.1 03/10/2019 0307   MONOABS 0.9 03/10/2019 0307   EOSABS 0.0  03/10/2019 0307   BASOSABS 0.0 03/10/2019 0307     Assessment/Plan:  1. AKI/CKD stage 4- hemodynamically mediated in setting of decompensated CHF, hypotension, and diuresis.  Cr peaked at 4.69 and improved slightly to 4.3 today.  No uremic symptoms.  She admitted that she would agree to HD "If I had to".   1. BUN/Cr continue to slowly improve 2. Continue to follow UOP and Scr for now.   3. No indications for HD at this time but will need to continue to follow up with [place] as an outpatient after discharge (last seen by Dr. Florene Glen in 2019). 2. Left MCA CVA- s/p thrombectomy/revascullarization by IR.  No anticoagulation due to subdural hematoma 3. Acute on chronic diastolic CHF- improving. 4. HTN- stable 5. Subdural hematoma from fall- no anticoagulation 6. Anemia- acute on chronic due to acute illness and CKD.  Check iron stores and may need to initiate ESA.  Donetta Potts, MD [employer] [phone]

## 2019-03-13 NOTE — Plan of Care (Signed)
  Problem: Education: Goal: Knowledge of General Education information will improve Description: Including pain rating scale, medication(s)/side effects and non-pharmacologic comfort measures Outcome: Progressing   Problem: Health Behavior/Discharge Planning: Goal: Ability to manage health-related needs will improve Outcome: Progressing   Problem: Clinical Measurements: Goal: Ability to maintain clinical measurements within normal limits will improve Outcome: Progressing Goal: Will remain free from infection Outcome: Progressing Goal: Diagnostic test results will improve Outcome: Progressing Goal: Respiratory complications will improve Outcome: Progressing Goal: Cardiovascular complication will be avoided Outcome: Progressing   Problem: Activity: Goal: Risk for activity intolerance will decrease Outcome: Progressing   Problem: Nutrition: Goal: Adequate nutrition will be maintained Outcome: Progressing   Problem: Coping: Goal: Level of anxiety will decrease Outcome: Progressing   Problem: Elimination: Goal: Will not experience complications related to urinary retention Outcome: Progressing   Problem: Pain Managment: Goal: General experience of comfort will improve Outcome: Progressing   Problem: Safety: Goal: Ability to remain free from injury will improve Outcome: Progressing   Problem: Skin Integrity: Goal: Risk for impaired skin integrity will decrease Outcome: Progressing   Problem: Education: Goal: Ability to demonstrate management of disease process will improve Outcome: Progressing Goal: Ability to verbalize understanding of medication therapies will improve Outcome: Progressing Goal: Individualized Educational Video(s) Outcome: Progressing   Problem: Activity: Goal: Capacity to carry out activities will improve Outcome: Progressing   Problem: Cardiac: Goal: Ability to achieve and maintain adequate cardiopulmonary perfusion will improve Outcome:  Progressing   Problem: Education: Goal: Understanding of cardiac disease, CV risk reduction, and recovery process will improve Outcome: Progressing Goal: Understanding of medication regimen will improve Outcome: Progressing Goal: Individualized Educational Video(s) Outcome: Progressing   Problem: Activity: Goal: Ability to tolerate increased activity will improve Outcome: Progressing   Problem: Cardiac: Goal: Ability to achieve and maintain adequate cardiopulmonary perfusion will improve Outcome: Progressing Goal: Vascular access site(s) Level 0-1 will be maintained Outcome: Progressing   Problem: Health Behavior/Discharge Planning: Goal: Ability to safely manage health-related needs after discharge will improve Outcome: Progressing   Problem: Education: Goal: Knowledge of disease or condition will improve Outcome: Progressing Goal: Knowledge of secondary prevention will improve Outcome: Progressing Goal: Knowledge of patient specific risk factors addressed and post discharge goals established will improve Outcome: Progressing Goal: Individualized Educational Video(s) Outcome: Progressing   Problem: Coping: Goal: Will verbalize positive feelings about self Outcome: Progressing Goal: Will identify appropriate support needs Outcome: Progressing   Problem: Health Behavior/Discharge Planning: Goal: Ability to manage health-related needs will improve Outcome: Progressing   Problem: Self-Care: Goal: Ability to participate in self-care as condition permits will improve Outcome: Progressing Goal: Verbalization of feelings and concerns over difficulty with self-care will improve Outcome: Progressing Goal: Ability to communicate needs accurately will improve Outcome: Progressing   Problem: Nutrition: Goal: Risk of aspiration will decrease Outcome: Progressing Goal: Dietary intake will improve Outcome: Progressing

## 2019-03-13 NOTE — Progress Notes (Signed)
Occupational Therapy Treatment Patient Details Name: [name] MRN: [id number] DOB: Sep 16, 1945 Today's Date: 03/13/2019    History of present illness Patient is a 74 y/o female who presents with dyspnea on exertion and multiple falls at home. Admitted with acute on chronic CHF. PMH includes CHF, thoracic scoliosis, CKD stage 4, obesity, HTN, DVT, seizures, DM.   OT comments  Pt participated in bed to 3n1 transfer with total (A) for peri care and taking a few pivot steps to chair this session. Pt fearful of falling. Pt enjoyed session and smiling. Total +2 mod (A) recommended for basic transfer.    Follow Up Recommendations  SNF;Supervision/Assistance - 24 hour    Equipment Recommendations  None recommended by OT    Recommendations for Other Services      Precautions / Restrictions Precautions Precautions: Fall Precaution Comments: 3 falls prior to admission       Mobility Bed Mobility Overal bed mobility: Needs Assistance Bed Mobility: Supine to Sit Rolling: Min assist   Supine to sit: Mod assist     General bed mobility comments: pt requires pad used to help turn him and hand over hand to place hand on bed rail. pt requires (A) to elevate trunk from bed surface  Transfers Overall transfer level: Needs assistance Equipment used: Rolling walker (2 wheeled) Transfers: Sit to/from Stand Sit to Stand: +2 physical assistance;Mod assist         General transfer comment: pt requires cues for hand placement and (A) to power up     Balance Overall balance assessment: Needs assistance Sitting-balance support: Feet supported;Single extremity supported;No upper extremity supported Sitting balance-Leahy Scale: Fair Sitting balance - Comments: Supervision for safety.   Standing balance support: During functional activity Standing balance-Leahy Scale: Poor Standing balance comment: reliant on UE support on an AD and min assist for dynamic stability presently                            ADL either performed or assessed with clinical judgement   ADL Overall ADL's : Needs assistance/impaired     Grooming: Set up;Sitting               Lower Body Dressing: Maximal assistance;Sit to/from stand;Sitting/lateral leans   Toilet Transfer: +2 for physical assistance;Moderate assistance Toilet Transfer Details (indicate cue type and reason): sit <>Stand from bed with bed pushed out of the way and 3n1 placed directly behind the 3n1.  Toileting- Clothing Manipulation and Hygiene: Total assistance Toileting - Clothing Manipulation Details (indicate cue type and reason): static standing for peri care       General ADL Comments: pt transfered EOB to 3n1 and then to chair for static sitting     Vision       Perception     Praxis      Cognition Arousal/Alertness: Awake/alert Behavior During Therapy: WFL for tasks assessed/performed Overall Cognitive Status: Impaired/Different from baseline                         Following Commands: Follows one step commands with increased time Safety/Judgement: Decreased awareness of safety;Decreased awareness of deficits Awareness: Emergent   General Comments: verbalized need to void but does not express need to get to bed pan or 3n1. pt needed initiation by therapist to engage in use of 3n1        Exercises Other Exercises Other Exercises: music played to help motivate patient  and pt laughing/ smiling during session   Shoulder Instructions       General Comments pt fatigued with sit<>Stand for peri and taking steps to chair    Pertinent Vitals/ Pain       Pain Assessment: No/denies pain  Home Living                                          Prior Functioning/Environment              Frequency  Min 2X/week        Progress Toward Goals  OT Goals(current goals can now be found in the care plan section)  Progress towards OT goals: Progressing toward  goals  Acute Rehab OT Goals Patient Stated Goal: any music OT Goal Formulation: With patient Time For Goal Achievement: 03/22/19 Potential to Achieve Goals: Good ADL Goals Pt Will Perform Lower Body Bathing: with min assist;sitting/lateral leans;sit to/from stand Pt Will Perform Lower Body Dressing: with min assist;sitting/lateral leans;sit to/from stand Pt Will Transfer to Toilet: with min guard assist;ambulating;regular height toilet;grab bars Additional ADL Goal #1: Pt will visually scan 3/5 BADL items with min VC's Additional ADL Goal #2: Pt will recall 3/5 home safety techniques to reduce falls  Plan Discharge plan remains appropriate    Co-evaluation    PT/OT/SLP Co-Evaluation/Treatment: Yes Reason for Co-Treatment: Complexity of the patient's impairments (multi-system involvement);For patient/therapist safety;Necessary to address cognition/behavior during functional activity;To address functional/ADL transfers   OT goals addressed during session: ADL's and self-care;Proper use of Adaptive equipment and DME;Strengthening/ROM      AM-PAC OT "6 Clicks" Daily Activity     Outcome Measure   Help from another person eating meals?: Total Help from another person taking care of personal grooming?: A Little Help from another person toileting, which includes using toliet, bedpan, or urinal?: A Lot Help from another person bathing (including washing, rinsing, drying)?: A Lot Help from another person to put on and taking off regular upper body clothing?: A Little Help from another person to put on and taking off regular lower body clothing?: A Lot 6 Click Score: 13    End of Session Equipment Utilized During Treatment: Rolling walker;Gait belt  OT Visit Diagnosis: Unsteadiness on feet (R26.81);Other abnormalities of gait and mobility (R26.89);Repeated falls (R29.6);Muscle weakness (generalized) (M62.81);History of falling (Z91.81);Other symptoms and signs involving cognitive  function   Activity Tolerance Patient tolerated treatment well   Patient Left in chair;with call bell/phone within reach;with chair alarm set   Nurse Communication Mobility status;Precautions        Time: 3662-9476 OT Time Calculation (min): 28 min  Charges: OT General Charges $OT Visit: 1 Visit OT Treatments $Self Care/Home Management : 8-22 mins   Brynn, OTR/L  Acute Rehabilitation Services Pager: [phone] Office: [phone] .    Jeri Modena 03/13/2019, 1:07 PM

## 2019-03-14 DIAGNOSIS — I130 Hypertensive heart and chronic kidney disease with heart failure and stage 1 through stage 4 chronic kidney disease, or unspecified chronic kidney disease: Secondary | ICD-10-CM | POA: Diagnosis not present

## 2019-03-14 DIAGNOSIS — E119 Type 2 diabetes mellitus without complications: Secondary | ICD-10-CM | POA: Diagnosis not present

## 2019-03-14 DIAGNOSIS — L98499 Non-pressure chronic ulcer of skin of other sites with unspecified severity: Secondary | ICD-10-CM | POA: Diagnosis not present

## 2019-03-14 DIAGNOSIS — N390 Urinary tract infection, site not specified: Secondary | ICD-10-CM | POA: Diagnosis not present

## 2019-03-14 DIAGNOSIS — E1151 Type 2 diabetes mellitus with diabetic peripheral angiopathy without gangrene: Secondary | ICD-10-CM | POA: Diagnosis not present

## 2019-03-14 DIAGNOSIS — L853 Xerosis cutis: Secondary | ICD-10-CM | POA: Diagnosis not present

## 2019-03-14 DIAGNOSIS — L602 Onychogryphosis: Secondary | ICD-10-CM | POA: Diagnosis not present

## 2019-03-14 DIAGNOSIS — J9601 Acute respiratory failure with hypoxia: Secondary | ICD-10-CM | POA: Diagnosis not present

## 2019-03-14 DIAGNOSIS — Z794 Long term (current) use of insulin: Secondary | ICD-10-CM | POA: Diagnosis not present

## 2019-03-14 DIAGNOSIS — Z743 Need for continuous supervision: Secondary | ICD-10-CM | POA: Diagnosis not present

## 2019-03-14 DIAGNOSIS — R7989 Other specified abnormal findings of blood chemistry: Secondary | ICD-10-CM | POA: Diagnosis not present

## 2019-03-14 DIAGNOSIS — E785 Hyperlipidemia, unspecified: Secondary | ICD-10-CM | POA: Diagnosis not present

## 2019-03-14 DIAGNOSIS — K219 Gastro-esophageal reflux disease without esophagitis: Secondary | ICD-10-CM | POA: Diagnosis not present

## 2019-03-14 DIAGNOSIS — N184 Chronic kidney disease, stage 4 (severe): Secondary | ICD-10-CM | POA: Diagnosis not present

## 2019-03-14 DIAGNOSIS — D631 Anemia in chronic kidney disease: Secondary | ICD-10-CM | POA: Diagnosis not present

## 2019-03-14 DIAGNOSIS — I5033 Acute on chronic diastolic (congestive) heart failure: Secondary | ICD-10-CM | POA: Diagnosis not present

## 2019-03-14 DIAGNOSIS — R279 Unspecified lack of coordination: Secondary | ICD-10-CM | POA: Diagnosis not present

## 2019-03-14 DIAGNOSIS — E875 Hyperkalemia: Secondary | ICD-10-CM | POA: Diagnosis not present

## 2019-03-14 DIAGNOSIS — G4733 Obstructive sleep apnea (adult) (pediatric): Secondary | ICD-10-CM | POA: Diagnosis not present

## 2019-03-14 DIAGNOSIS — M6281 Muscle weakness (generalized): Secondary | ICD-10-CM | POA: Diagnosis not present

## 2019-03-14 DIAGNOSIS — R5381 Other malaise: Secondary | ICD-10-CM | POA: Diagnosis not present

## 2019-03-14 DIAGNOSIS — I10 Essential (primary) hypertension: Secondary | ICD-10-CM | POA: Diagnosis not present

## 2019-03-14 DIAGNOSIS — I4891 Unspecified atrial fibrillation: Secondary | ICD-10-CM | POA: Diagnosis not present

## 2019-03-14 DIAGNOSIS — E1122 Type 2 diabetes mellitus with diabetic chronic kidney disease: Secondary | ICD-10-CM | POA: Diagnosis not present

## 2019-03-14 DIAGNOSIS — I63412 Cerebral infarction due to embolism of left middle cerebral artery: Secondary | ICD-10-CM | POA: Diagnosis not present

## 2019-03-14 DIAGNOSIS — E1129 Type 2 diabetes mellitus with other diabetic kidney complication: Secondary | ICD-10-CM | POA: Diagnosis not present

## 2019-03-14 LAB — CBC
HCT: 23.7 % — ABNORMAL LOW (ref 36.0–46.0)
Hemoglobin: 7.9 g/dL — ABNORMAL LOW (ref 12.0–15.0)
MCH: 32 pg (ref 26.0–34.0)
MCHC: 33.3 g/dL (ref 30.0–36.0)
MCV: 96 fL (ref 80.0–100.0)
Platelets: UNDETERMINED 10*3/uL (ref 150–400)
RBC: 2.47 MIL/uL — ABNORMAL LOW (ref 3.87–5.11)
RDW: 12.8 % (ref 11.5–15.5)
WBC: 5.8 10*3/uL (ref 4.0–10.5)
nRBC: 0 % (ref 0.0–0.2)

## 2019-03-14 LAB — RENAL FUNCTION PANEL
Albumin: 2.3 g/dL — ABNORMAL LOW (ref 3.5–5.0)
Anion gap: 12 (ref 5–15)
BUN: 49 mg/dL — ABNORMAL HIGH (ref 8–23)
CO2: 26 mmol/L (ref 22–32)
Calcium: 8.2 mg/dL — ABNORMAL LOW (ref 8.9–10.3)
Chloride: 104 mmol/L (ref 98–111)
Creatinine, Ser: 3.16 mg/dL — ABNORMAL HIGH (ref 0.44–1.00)
GFR calc Af Amer: 16 mL/min — ABNORMAL LOW (ref >60)
GFR calc non Af Amer: 14 mL/min — ABNORMAL LOW (ref >60)
Glucose, Bld: 137 mg/dL — ABNORMAL HIGH (ref 70–99)
Phosphorus: 3.4 mg/dL (ref 2.5–4.6)
Potassium: 5 mmol/L (ref 3.5–5.1)
Sodium: 142 mmol/L (ref 135–145)

## 2019-03-14 LAB — GLUCOSE, CAPILLARY
Glucose-Capillary: 121 mg/dL — ABNORMAL HIGH (ref 70–99)
Glucose-Capillary: 185 mg/dL — ABNORMAL HIGH (ref 70–99)

## 2019-03-14 LAB — IRON AND TIBC
Iron: 62 ug/dL (ref 28–170)
Saturation Ratios: 31 % (ref 10.4–31.8)
TIBC: 197 ug/dL — ABNORMAL LOW (ref 250–450)
UIBC: 135 ug/dL

## 2019-03-14 LAB — FERRITIN: Ferritin: 60 ng/mL (ref 11–307)

## 2019-03-14 MED ORDER — SODIUM BICARBONATE 650 MG PO TABS: 1300.0000 mg | ORAL_TABLET | Freq: Three times a day (TID) | ORAL | 0 refills | Status: DC

## 2019-03-14 NOTE — Discharge Summary (Addendum)
Physician Discharge Summary  [name] [id number]:[id number] DOB: [birth year]   PCP: Benito Mccreedy, MD  Admit date: 03/04/2019 Discharge date: 03/14/2019 Length of Stay: 9 days   Code Status: Full Code  Admitted From:  Home Discharged to: SNF Discharge Condition:  Stable  Recommendations for Outpatient Follow-up   1. Follow up with PCP in 1 week 2. Follow up BMP/CBC  3. Follow-up with nephrology in 1 month  Hospital Summary  [name] is a 74 y.o. female with a history of diabetes, seizures, OSA morbid obesity, hypertension, hyperlipidemia, CKD 4, HFpEF who was admitted for acute on chronic diastolic heart failure with elevated troponin on 2/7 to cardiology team initially.  Complained of leg weakness and falling initially.  Found to have left MCA infarct, status post thrombectomy and started on aspirin.  Was not a candidate for dual antiplatelets or anticoagulation due to SDH from her fall.  Had postoperative respiratory failure and remained intubated, was successfully extubated.  She was evaluated by PT who recommended SNF at discharge.  A & P   Principal Problem:   Middle cerebral artery embolism, left Active Problems:   Hyperlipidemia   OSA (obstructive sleep apnea)   Essential hypertension   Junctional bradycardia   Seizures disorder   Diabetes mellitus type 2, insulin dependent (HCC)   Morbid obesity with BMI of 45.0-49.9, adult (HCC)   CKD (chronic kidney disease) stage 4, GFR 15-29 ml/min (HCC)   Acute hyperkalemia   Acute on chronic diastolic CHF (congestive heart failure) ([place])   1.  Acute exacerbation of chronic heart failure with preserved ejection fraction/junctional bradycardia.  significant improvement with diuresis.  Blood pressure still running low though stable MAP of 60.  Limited echocardiogram showed ejection fraction 65 to 70%, normal left ventricular systolic function and no wall motion abnormalities.  Cardiology consulted and does not think patient  requires pacemaker at this stage IV junctional bradycardia.  Continue Lasix 40 mg, off antihypertensives due to hypotension.  Follow-up outpatient  2.  Acute CVA/subdural hematoma.  Status post thrombectomy revascularization by interventional radiology.  Subdural hematoma from previous fall. Continue with aspirin.  No anticoagulation in the setting of subdural hematoma and history of recurrent falls  3.    AKI on chronic kidney disease stage IV (eGFR 15-29).    Nephrology consulted, no indication for HD.  Needs outpatient nephrology follow-up which will be evaluated.  BMP in 1 week.  Started on sodium bicarb 3 times daily  4.  Hyperkalemia.  Resolved.  Status post Kayexalate, Lokelma and sodium bicarb.  5.  Essential hypertension.    Now running low blood pressure with systolics in the 44Y. Hold all antihypertensive medication and monitor blood pressure closely  6.  Hyperlipidemia Continue with ezetimibe/simvastatin  7.  Diabetes mellitus type 2.  Hemoglobin A1c of 5.6.  Continue home medications  8.  History of seizure disorder Continue with Tegretol  9.  Obstructive sleep apnea CPAP at night and as needed  10.  Morbid obesity with BMI 44.3 Dietary and lifestyle modification  I called patient's daughter to update but unfortunately she did not respond  Addendum: I was able to discuss the plan with the patient's daughter by phone  Consultants  . Cardiology . Nephrology . IR  Procedures  . 2/11 left MCA thrombectomy  Antibiotics   Anti-infectives (From admission, onward)   Start     Dose/Rate Route Frequency Ordered Stop   03/07/19 0803  ceFAZolin (ANCEF) 2-4 GM/100ML-% IVPB  Note to Pharmacy: Fredric Dine   : cabinet override      03/07/19 0803 03/07/19 2014       Subjective  Patient seen and examined at bedside no acute distress and resting comfortably.  No events overnight.  Tolerating diet. In good spirits and anticipating discharge.   Denies any  chest pain, shortness of breath, fever, nausea, vomiting, urinary or bowel complaints. Otherwise ROS negative    Objective   Discharge Exam: Vitals:   03/14/19 0740 03/14/19 1132  BP: (!) 155/72 107/81  Pulse: 68 68  Resp: 18 16  Temp: 98.6 F (37 C) 98.7 F (37.1 C)  SpO2: 100% 93%   Vitals:   03/14/19 0401 03/14/19 0404 03/14/19 0740 03/14/19 1132  BP: (!) 153/80  (!) 155/72 107/81  Pulse: 66  68 68  Resp: '19  18 16  '$ Temp: 98.2 F (36.8 C)  98.6 F (37 C) 98.7 F (37.1 C)  TempSrc: Oral   Oral  SpO2: 93%  100% 93%  Weight:  113.7 kg    Height:        Physical Exam Vitals and nursing note reviewed.  Constitutional:      Appearance: Normal appearance.  HENT:     Head: Normocephalic and atraumatic.  Eyes:     Conjunctiva/sclera: Conjunctivae normal.  Cardiovascular:     Rate and Rhythm: Regular rhythm. Bradycardia present.  Pulmonary:     Effort: Pulmonary effort is normal.     Breath sounds: Normal breath sounds.  Abdominal:     General: Abdomen is flat.     Palpations: Abdomen is soft.  Musculoskeletal:        General: No swelling or tenderness.  Skin:    Coloration: Skin is not jaundiced or pale.  Neurological:     Mental Status: She is alert. Mental status is at baseline.  Psychiatric:        Mood and Affect: Mood normal.        Behavior: Behavior normal.       The results of significant diagnostics from this hospitalization (including imaging, microbiology, ancillary and laboratory) are listed below for reference.     Microbiology: Recent Results (from the past 240 hour(s))  MRSA PCR Screening     Status: None   Collection Time: 03/07/19 10:40 AM   Specimen: Nasopharyngeal  Result Value Ref Range Status   MRSA by PCR NEGATIVE NEGATIVE Final    Comment:        The GeneXpert MRSA Assay (FDA approved for NASAL specimens only), is one component of a comprehensive MRSA colonization surveillance program. It is not intended to diagnose  MRSA infection nor to guide or monitor treatment for MRSA infections. Performed at [facility], [place] [address], [place], [place] [address]   SARS CORONAVIRUS 2 (TAT 6-24 HRS) Nasopharyngeal Nasopharyngeal Swab     Status: None   Collection Time: 03/13/19  5:30 PM   Specimen: Nasopharyngeal Swab  Result Value Ref Range Status   SARS Coronavirus 2 NEGATIVE NEGATIVE Final    Comment: (NOTE) SARS-CoV-2 target nucleic acids are NOT DETECTED. The SARS-CoV-2 RNA is generally detectable in upper and lower respiratory specimens during the acute phase of infection. Negative results do not preclude SARS-CoV-2 infection, do not rule out co-infections with other pathogens, and should not be used as the sole basis for treatment or other patient management decisions. Negative results must be combined with clinical observations, patient history, and epidemiological information. The expected result is Negative.  Fact Sheet for Patients: SugarRoll.be Fact Sheet for Healthcare Providers: https://www.woods-mathews.com/ This test is not yet approved or cleared by the [place] FDA and  has been authorized for detection and/or diagnosis of SARS-CoV-2 by FDA under an Emergency Use Authorization (EUA). This EUA will remain  in effect (meaning this test can be used) for the duration of the COVID-19 declaration under Section 56 4(b)(1) of the Act, 21 U.S.C. section 360bbb-3(b)(1), unless the authorization is terminated or revoked sooner. Performed at [facility], [place] [address] [place], [place] [address]      Labs: BNP (last 3 results) Recent Labs    03/04/19 0954  BNP 3,903.0*   Basic Metabolic Panel: Recent Labs  Lab 03/10/19 0307 03/11/19 0455 03/12/19 0504 03/13/19 0217 03/14/19 0253  NA 139 138 137 139 142  K 4.1 4.3 4.7 4.1 5.0  CL 105 103 101 103 104  CO2 20* 22 21* 25 26  GLUCOSE 163* 175* 140* 151* 137*  BUN 51* 57* 58*  54* 49*  CREATININE 4.00* 4.69* 4.30* 3.80* 3.16*  CALCIUM 8.5* 8.2* 8.1* 8.2* 8.2*  MG  --  2.1 2.1 1.9  --   PHOS 6.3* 5.8* 5.1* 3.7 3.4   Liver Function Tests: Recent Labs  Lab 03/09/19 2246 03/09/19 2246 03/10/19 0307 03/11/19 0455 03/12/19 0504 03/13/19 0217 03/14/19 0253  AST 21  --   --  19 19  --   --   ALT 14  --   --  13 15  --   --   ALKPHOS 81  --   --  81 77  --   --   BILITOT 0.6  --   --  0.7 0.8  --   --   PROT 6.0*  --   --  6.2* 5.8*  --   --   ALBUMIN 2.7*   < > 2.9* 2.7* 2.6* 2.5* 2.3*   < > = values in this interval not displayed.   No results for input(s): LIPASE, AMYLASE in the last 168 hours. No results for input(s): AMMONIA in the last 168 hours. CBC: Recent Labs  Lab 03/08/19 0545 03/08/19 0545 03/10/19 0307 03/11/19 0455 03/12/19 0504 03/13/19 0217 03/14/19 0253  WBC 7.4   < > 6.2 7.5 7.1 6.4 5.8  NEUTROABS 6.3  --  4.1  --   --   --   --   HGB 8.4*   < > 8.9* 7.7* 7.6* 7.7* 7.9*  HCT 25.5*   < > 26.4* 23.2* 22.7* 23.4* 23.7*  MCV 95.1   < > 95.7 95.1 93.4 95.9 96.0  PLT 198   < > 182 223 236 255 PLATELET CLUMPS NOTED ON SMEAR, UNABLE TO ESTIMATE   < > = values in this interval not displayed.   Cardiac Enzymes: No results for input(s): CKTOTAL, CKMB, CKMBINDEX, TROPONINI in the last 168 hours. BNP: Invalid input(s): POCBNP CBG: Recent Labs  Lab 03/13/19 1115 03/13/19 1623 03/13/19 2106 03/14/19 0614 03/14/19 1133  GLUCAP 209* 204* 210* 121* 185*   D-Dimer No results for input(s): DDIMER in the last 72 hours. Hgb A1c No results for input(s): HGBA1C in the last 72 hours. Lipid Profile No results for input(s): CHOL, HDL, LDLCALC, TRIG, CHOLHDL, LDLDIRECT in the last 72 hours. Thyroid function studies No results for input(s): TSH, T4TOTAL, T3FREE, THYROIDAB in the last 72 hours.  Invalid input(s): FREET3 Anemia work up Recent Labs    03/14/19 0253  FERRITIN 60  TIBC 197*  IRON 62   Urinalysis    Component Value  Date/Time   COLORURINE YELLOW 03/09/2019 1603   APPEARANCEUR HAZY (A) 03/09/2019 1603   LABSPEC 1.020 03/09/2019 1603   PHURINE 5.0 03/09/2019 1603   GLUCOSEU NEGATIVE 03/09/2019 1603   HGBUR NEGATIVE 03/09/2019 1603   BILIRUBINUR SMALL (A) 03/09/2019 1603   KETONESUR 15 (A) 03/09/2019 1603   PROTEINUR NEGATIVE 03/09/2019 1603   UROBILINOGEN 0.2 11/14/2014 1946   NITRITE NEGATIVE 03/09/2019 1603   LEUKOCYTESUR SMALL (A) 03/09/2019 1603   Sepsis Labs Invalid input(s): PROCALCITONIN,  WBC,  LACTICIDVEN Microbiology Recent Results (from the past 240 hour(s))  MRSA PCR Screening     Status: None   Collection Time: 03/07/19 10:40 AM   Specimen: Nasopharyngeal  Result Value Ref Range Status   MRSA by PCR NEGATIVE NEGATIVE Final    Comment:        The GeneXpert MRSA Assay (FDA approved for NASAL specimens only), is one component of a comprehensive MRSA colonization surveillance program. It is not intended to diagnose MRSA infection nor to guide or monitor treatment for MRSA infections. Performed at [facility], [place] [address], [place], [place] [address]   SARS CORONAVIRUS 2 (TAT 6-24 HRS) Nasopharyngeal Nasopharyngeal Swab     Status: None   Collection Time: 03/13/19  5:30 PM   Specimen: Nasopharyngeal Swab  Result Value Ref Range Status   SARS Coronavirus 2 NEGATIVE NEGATIVE Final    Comment: (NOTE) SARS-CoV-2 target nucleic acids are NOT DETECTED. The SARS-CoV-2 RNA is generally detectable in upper and lower respiratory specimens during the acute phase of infection. Negative results do not preclude SARS-CoV-2 infection, do not rule out co-infections with other pathogens, and should not be used as the sole basis for treatment or other patient management decisions. Negative results must be combined with clinical observations, patient history, and epidemiological information. The expected result is Negative. Fact Sheet for  Patients: SugarRoll.be Fact Sheet for Healthcare Providers: https://www.woods-mathews.com/ This test is not yet approved or cleared by the [place] FDA and  has been authorized for detection and/or diagnosis of SARS-CoV-2 by FDA under an Emergency Use Authorization (EUA). This EUA will remain  in effect (meaning this test can be used) for the duration of the COVID-19 declaration under Section 56 4(b)(1) of the Act, 21 U.S.C. section 360bbb-3(b)(1), unless the authorization is terminated or revoked sooner. Performed at [facility], [place] [address] [place], [place] [address]     Discharge Instructions     Discharge Instructions    Ambulatory referral to Neurology   Complete by: As directed    Follow up in stroke clinic at [facility] with Dr. Leonie Man in about 3-4 weeks.  Pt for d/c to SNF likely next week   Diet - low sodium heart healthy   Complete by: As directed    Discharge instructions   Complete by: As directed    You were seen and examined in the hospital for heart failure and stroke and cared for by a hospitalist.   Upon Discharge:  -Take aspirin daily -Take Zocor and Zetia (ezetimibe) daily -Take sodium bicarbonate 1300 mg by mouth 3 times daily -Stop taking amlodipine and hydralazine -You will need to follow-up with the kidney doctor in the next month.  This will be arranged for you. Make an appointment with your primary care physician within 7 days Get lab work prior to your follow up appointment with your PCP Bring all home medications to your  appointment to review Request that your primary physician go over all hospital tests and procedures/radiological results at the follow up.   Please get all hospital records sent to your physician by signing a hospital release before you go home.   Read the complete instructions along with all the possible side effects for all the medicines you take and  that have been prescribed to you. Take any new medicines after you have completely understood and accept all the possible adverse reactions/side effects.   If you have any questions about your discharge medications or the care you received while you were in the hospital, you can call the unit and asked to speak with the hospitalist on call. Once you are discharged, your primary care physician will handle any further medical issues. Please note that NO REFILLS for any discharge medications will be authorized, as it is imperative that you return to your primary care physician (or establish a relationship with a primary care physician if you do not have one) for your aftercare needs so that they can reassess your need for medications and monitor your lab values.   Do not drive, operate heavy machinery, perform activities at heights, swimming or participation in water activities or provide baby sitting services if your were admitted for loss of consciousness/seizures or if you are on sedating medications including, but not limited to benzodiazepines, sleep medications, narcotic pain medications, etc., until you have been cleared to do so by a medical doctor.   Do not take more than prescribed medications.   Wear a seat belt while driving.  If you have smoked or chewed Tobacco in the last 2 years please stop smoking; also stop any regular Alcohol and/or any Recreational drug use including marijuana.  If you experience worsening of your admission symptoms or develop shortness of breath, chest pain, suicidal or homicidal thoughts or experience a life threatening emergency, you must seek medical attention immediately by calling 911 or calling your PCP immediately.   Increase activity slowly   Complete by: As directed      Allergies as of 03/14/2019      Reactions   Codeine Nausea And Vomiting   Metoprolol Other (See Comments)   Severe bradycardia.  Now getting admitted for 3rd time in 3 years for  bradycardia onset after being started on toprol.  Please dont put patient on this med anymore!      Medication List    STOP taking these medications   amLODipine 10 MG tablet Commonly known as: NORVASC   hydrALAZINE 25 MG tablet Commonly known as: APRESOLINE     TAKE these medications   Accu-Chek Aviva Plus test strip Generic drug: glucose blood 3 (three) times daily. for testing   Accu-Chek Softclix Lancets lancets See admin instructions.   aspirin 81 MG chewable tablet Chew 1 tablet (81 mg total) by mouth daily.   B-D ULTRAFINE III SHORT PEN 31G X 8 MM Misc Generic drug: Insulin Pen Needle See admin instructions.   bisacodyl 5 MG EC tablet Commonly known as: DULCOLAX Take 2 tablets (10 mg total) by mouth daily as needed.   carbamazepine 200 MG tablet Commonly known as: TEGRETOL Take 400 mg by mouth 2 (two) times daily.   citalopram 20 MG tablet Commonly known as: CELEXA Take 20 mg by mouth daily.   Durezol 0.05 % Emul Generic drug: Difluprednate Place 1 drop into the right eye 3 (three) times daily.   ezetimibe-simvastatin 10-40 MG tablet Commonly known as: VYTORIN Take 1  tablet by mouth daily.   furosemide 80 MG tablet Commonly known as: LASIX Take 0.5 tablets (40 mg total) by mouth daily.   glipiZIDE 5 MG 24 hr tablet Commonly known as: GLUCOTROL XL Take 5 mg by mouth daily with breakfast.   HumuLIN 70/30 KwikPen (70-30) 100 UNIT/ML PEN Generic drug: Insulin Isophane & Regular Human Inject 5 Units into the skin 2 (two) times daily.   mirtazapine 15 MG tablet Commonly known as: REMERON Take 15 mg by mouth at bedtime.   omeprazole 40 MG capsule Commonly known as: PRILOSEC Take 40 mg by mouth daily.   sodium bicarbonate 650 MG tablet Take 2 tablets (1,300 mg total) by mouth 3 (three) times daily.   VITAMIN C PO Take 1 tablet by mouth daily.   Vitamin D-3 25 MCG (1000 UT) Caps Take 1,000 Units by mouth daily at 12 noon.       Contact  information for follow-up providers    Garvin Fila, MD Follow up in 4 week(s).   Specialties: Neurology, Radiology Why: stroke clinic. office will call with appt date and time Contact information: [place] [address] [phone]            Contact information for after-discharge care    Destination    [facility] .   Service: Skilled Nursing Contact information: [address] [address] [phone]                 Allergies  Allergen Reactions  . Codeine Nausea And Vomiting  . Metoprolol Other (See Comments)    Severe bradycardia.  Now getting admitted for 3rd time in 3 years for bradycardia onset after being started on toprol.  Please dont put patient on this med anymore!    Time coordinating discharge: Over 30 minutes   SIGNED:   Harold Hedge, D.O. [facility] Pager: [phone]  03/14/2019, 1:44 PM

## 2019-03-14 NOTE — Progress Notes (Signed)
Patient ID: [name], female   DOB: [birth year], 74 y.o.   MRN: [id number] S: Feels well, no complaints                                                                                                                                                                                 O:BP 107/81 (BP Location: Left Arm)   Pulse 68   Temp 98.7 F (37.1 C) (Oral)   Resp 16   Ht 5\' 2"  (1.575 m)   Wt 113.7 kg   SpO2 93%   BMI 45.85 kg/m   Intake/Output Summary (Last 24 hours) at 03/14/2019 1254 Last data filed at 03/14/2019 4098 Gross per 24 hour  Intake 120 ml  Output 300 ml  Net -180 ml   Intake/Output: I/O last 3 completed shifts: In: -  Out: 1125 [Urine:1125]  Intake/Output this shift:  Total I/O In: 120 [P.O.:120] Out: -  Weight change: 1.9 kg Gen: NAD CVS: no rub Resp: cta Abd: obese, +BS, soft. NT Ext: minimal pretibial edema  Recent Labs  Lab 03/09/19 1709 03/09/19 2246 03/10/19 0307 03/11/19 0455 03/12/19 0504 03/13/19 0217 03/14/19 0253  NA 139 139 139 138 137 139 142  K 4.8 4.2 4.1 4.3 4.7 4.1 5.0  CL 111 106 105 103 101 103 104  CO2 14* 19* 20* 22 21* 25 26  GLUCOSE 212* 209* 163* 175* 140* 151* 137*  BUN 51* 50* 51* 57* 58* 54* 49*  CREATININE 3.69* 3.90* 4.00* 4.69* 4.30* 3.80* 3.16*  ALBUMIN 2.8* 2.7* 2.9* 2.7* 2.6* 2.5* 2.3*  CALCIUM 8.5* 8.2* 8.5* 8.2* 8.1* 8.2* 8.2*  PHOS 5.5*  --  6.3* 5.8* 5.1* 3.7 3.4  AST  --  21  --  19 19  --   --   ALT  --  14  --  13 15  --   --    Liver Function Tests: Recent Labs  Lab 03/09/19 2246 03/10/19 0307 03/11/19 0455 03/11/19 0455 03/12/19 0504 03/13/19 0217 03/14/19 0253  AST 21  --  19  --  19  --   --   ALT 14  --  13  --  15  --   --   ALKPHOS 81  --  81  --  77  --   --   BILITOT 0.6  --  0.7  --  0.8  --   --   PROT 6.0*  --  6.2*  --  5.8*  --   --   ALBUMIN 2.7*   < > 2.7*   < > 2.6* 2.5* 2.3*   < > = values in this interval not displayed.  No results for input(s): LIPASE, AMYLASE in the  last 168 hours. No results for input(s): AMMONIA in the last 168 hours. CBC: Recent Labs  Lab 03/08/19 0545 03/08/19 0545 03/10/19 0307 03/10/19 0307 03/11/19 0455 03/11/19 0455 03/12/19 0504 03/13/19 0217 03/14/19 0253  WBC 7.4   < > 6.2   < > 7.5   < > 7.1 6.4 5.8  NEUTROABS 6.3  --  4.1  --   --   --   --   --   --   HGB 8.4*   < > 8.9*   < > 7.7*   < > 7.6* 7.7* 7.9*  HCT 25.5*   < > 26.4*   < > 23.2*   < > 22.7* 23.4* 23.7*  MCV 95.1   < > 95.7  --  95.1  --  93.4 95.9 96.0  PLT 198   < > 182   < > 223   < > 236 255 PLATELET CLUMPS NOTED ON SMEAR, UNABLE TO ESTIMATE   < > = values in this interval not displayed.   Cardiac Enzymes: No results for input(s): CKTOTAL, CKMB, CKMBINDEX, TROPONINI in the last 168 hours. CBG: Recent Labs  Lab 03/13/19 1115 03/13/19 1623 03/13/19 2106 03/14/19 0614 03/14/19 1133  GLUCAP 209* 204* 210* 121* 185*    Iron Studies:  Recent Labs    03/14/19 0253  IRON 62  TIBC 197*  FERRITIN 60   Studies/Results: No results found. .  stroke: mapping our early stages of recovery book   Does not apply Once  . aspirin EC  81 mg Oral Daily  . carbamazepine  400 mg Oral BID  . Chlorhexidine Gluconate Cloth  6 each Topical Daily  . citalopram  20 mg Oral Daily  . ezetimibe  10 mg Oral Daily   And  . simvastatin  40 mg Per Tube Daily  . furosemide  40 mg Oral Daily  . insulin aspart  0-5 Units Subcutaneous QHS  . insulin aspart  0-6 Units Subcutaneous TID WC  . mirtazapine  15 mg Oral QHS  . pantoprazole  40 mg Oral Daily  . sodium bicarbonate  1,300 mg Oral TID  . sodium chloride flush  3 mL Intravenous Q12H    BMET    Component Value Date/Time   NA 142 03/14/2019 0253   K 5.0 03/14/2019 0253   CL 104 03/14/2019 0253   CO2 26 03/14/2019 0253   GLUCOSE 137 (H) 03/14/2019 0253   BUN 49 (H) 03/14/2019 0253   CREATININE 3.16 (H) 03/14/2019 0253   CALCIUM 8.2 (L) 03/14/2019 0253   GFRNONAA 14 (L) 03/14/2019 0253   GFRAA 16 (L)  03/14/2019 0253   CBC    Component Value Date/Time   WBC 5.8 03/14/2019 0253   RBC 2.47 (L) 03/14/2019 0253   HGB 7.9 (L) 03/14/2019 0253   HCT 23.7 (L) 03/14/2019 0253   PLT PLATELET CLUMPS NOTED ON SMEAR, UNABLE TO ESTIMATE 03/14/2019 0253   MCV 96.0 03/14/2019 0253   MCH 32.0 03/14/2019 0253   MCHC 33.3 03/14/2019 0253   RDW 12.8 03/14/2019 0253   LYMPHSABS 1.1 03/10/2019 0307   MONOABS 0.9 03/10/2019 0307   EOSABS 0.0 03/10/2019 0307   BASOSABS 0.0 03/10/2019 0307    Assessment/Plan:  1. AKI/CKD stage 4- hemodynamically mediated in setting of decompensated CHF, hypotension, and diuresis. Cr peaked at 4.69 and improved slightly to 4.3 today. No uremic symptoms. She admitted that she would agree to HD "  If I had to".  1. BUN/Cr continue to slowly improve 2. No indications for HD at this time but will need to continue to follow up with [place] as an outpatient after discharge (last seen by Dr. Florene Glen in 2019). 3. Nothing further to add and will sign off.   4. Will arrange for outpatient follow up with our office next month to re-establish Nephrology care 2. Left MCA CVA- s/p thrombectomy/revascullarization by IR. No anticoagulation due to subdural hematoma 3. Acute on chronic diastolic CHF- improving. 4. HTN- stable 5. Subdural hematoma from fall- no anticoagulation 6. Anemia- acute on chronic due to acute illness and CKD.  Check iron stores and may need to initiate ESA. 7. Disposition- for discharge to SNF for rehab Donetta Potts, MD [facility] [phone]

## 2019-03-14 NOTE — Progress Notes (Signed)
Discharged to [facility].  Transported by Corey Harold on stretcher.

## 2019-03-14 NOTE — Consult Note (Signed)
   [facility] CM Inpatient Consult   03/14/2019  [name] 1945-06-18 [id number]   Follow up: Disposition update  Chart review for long length of stay and patient is for a skilled nursing facility level of care and is authorized for [facility] [facility] [employer]  No post hospital follow up needed from [facility], at this time.  For questions, please contact:  Natividad Brood, RN BSN [facility] Liaison  [phone] business mobile phone Toll free office [phone]  Fax number: [phone] [email or web address]

## 2019-03-14 NOTE — Progress Notes (Signed)
Report called to [facility] at [facility].

## 2019-03-14 NOTE — TOC Transition Note (Signed)
Transition of Care [facility]) - CM/SW Discharge Note   Patient Details  Name: DENEISE GETTY MRN: [id number] Date of Birth: Dec 11, 1945  [facility]) CM/SW Contact:  Pollie Friar, RN Phone Number: 03/14/2019, 2:11 PM   Clinical Narrative:    Pt discharging to [facility] today. Patient and daughter are aware. [facility] to provide transport. D/c packet at the desk and bedside RN updated.   Room: 112p Number for report; [phone]   Final next level of care: Skilled Nursing Facility Barriers to Discharge: No Barriers Identified   Patient Goals and CMS Choice   CMS Medicare.gov Compare Post Acute Care list provided to:: Patient Represenative (must comment) Choice offered to / list presented to : Patient, Adult Children  Discharge Placement              Patient chooses bed at: [facility] Patient to be transferred to facility by: [place] Name of family member notified: [name]--daughter Patient and family notified of of transfer: 03/14/19  Discharge Plan and Services     Post Acute Care Choice: [facility]                               Social Determinants of Health (SDOH) Interventions     Readmission Risk Interventions No flowsheet data found.

## 2019-03-14 NOTE — Progress Notes (Signed)
Pt has received authorization for SNF at [facility].  Ref #: [id number] auth #: [id number] Auth active 2/16-2/18 Darnelle Bos is reviewer.

## 2019-03-15 DIAGNOSIS — I5033 Acute on chronic diastolic (congestive) heart failure: Secondary | ICD-10-CM | POA: Diagnosis not present

## 2019-03-15 DIAGNOSIS — E1122 Type 2 diabetes mellitus with diabetic chronic kidney disease: Secondary | ICD-10-CM | POA: Diagnosis not present

## 2019-03-15 DIAGNOSIS — I10 Essential (primary) hypertension: Secondary | ICD-10-CM | POA: Diagnosis not present

## 2019-03-15 NOTE — Progress Notes (Signed)
Late entry for missed modified rankin score. Based on review of the medical record.     03/13/19 1924  Modified Rankin (Stroke Patients Only)  Pre-Morbid Rankin Score 3  Modified Rankin [place], [place]   Acute Rehabilitation Services  Pager [phone] Office [phone] 03/15/2019

## 2019-03-22 DIAGNOSIS — L98499 Non-pressure chronic ulcer of skin of other sites with unspecified severity: Secondary | ICD-10-CM | POA: Diagnosis not present

## 2019-03-27 DIAGNOSIS — E1151 Type 2 diabetes mellitus with diabetic peripheral angiopathy without gangrene: Secondary | ICD-10-CM | POA: Diagnosis not present

## 2019-03-27 DIAGNOSIS — L853 Xerosis cutis: Secondary | ICD-10-CM | POA: Diagnosis not present

## 2019-03-29 DIAGNOSIS — L98499 Non-pressure chronic ulcer of skin of other sites with unspecified severity: Secondary | ICD-10-CM | POA: Diagnosis not present

## 2019-04-05 DIAGNOSIS — N184 Chronic kidney disease, stage 4 (severe): Secondary | ICD-10-CM | POA: Diagnosis not present

## 2019-04-05 DIAGNOSIS — I5033 Acute on chronic diastolic (congestive) heart failure: Secondary | ICD-10-CM | POA: Diagnosis not present

## 2019-04-05 DIAGNOSIS — M6281 Muscle weakness (generalized): Secondary | ICD-10-CM | POA: Diagnosis not present

## 2019-04-05 DIAGNOSIS — E1122 Type 2 diabetes mellitus with diabetic chronic kidney disease: Secondary | ICD-10-CM | POA: Diagnosis not present

## 2019-04-19 DIAGNOSIS — I10 Essential (primary) hypertension: Secondary | ICD-10-CM | POA: Diagnosis not present

## 2019-04-19 DIAGNOSIS — E1159 Type 2 diabetes mellitus with other circulatory complications: Secondary | ICD-10-CM | POA: Diagnosis not present

## 2019-04-19 DIAGNOSIS — K219 Gastro-esophageal reflux disease without esophagitis: Secondary | ICD-10-CM | POA: Diagnosis not present

## 2019-04-19 DIAGNOSIS — I509 Heart failure, unspecified: Secondary | ICD-10-CM | POA: Diagnosis not present

## 2019-05-02 ENCOUNTER — Other Ambulatory Visit: Payer: Self-pay

## 2019-05-02 ENCOUNTER — Ambulatory Visit (INDEPENDENT_AMBULATORY_CARE_PROVIDER_SITE_OTHER): Payer: Medicare Other | Admitting: Neurology

## 2019-05-02 ENCOUNTER — Encounter: Payer: Self-pay | Admitting: Neurology

## 2019-05-02 ENCOUNTER — Telehealth: Payer: Self-pay | Admitting: Neurology

## 2019-05-02 VITALS — BP 116/72 | HR 79 | Temp 98.2°F | Ht 62.0 in | Wt 239.0 lb

## 2019-05-02 DIAGNOSIS — Z6841 Body Mass Index (BMI) 40.0 and over, adult: Secondary | ICD-10-CM | POA: Diagnosis not present

## 2019-05-02 DIAGNOSIS — I6602 Occlusion and stenosis of left middle cerebral artery: Secondary | ICD-10-CM | POA: Diagnosis not present

## 2019-05-02 NOTE — Progress Notes (Signed)
[facility] [address] [place]. [place] [address] [phone]       OFFICE CONSULT NOTE  [name] Date of Birth:  January 31, 1945 Medical Record Number:  [id number]   Referring MD: Rosalin Hawking Reason for Referral: Stroke HPI: [name] is a 74 year old African-American lady seen today for initial office consultation visit for stroke.  She is accompanied by her granddaughter.  History is obtained from them, review of electronic medical records and I personally reviewed imaging films in PACS.[name] is a 74 y.o. female with past medical history of with past medical history of chronic diastolic heart failure, diabetes mellitus, hypertension, CKD stage IV, morbid obesity, obstructive sleep apnea, first-degree AV block admitted to [facility] cardiology service for acute on chronic heart failure 2 days ago.Patient was her normal self this morning and had 6:15 AM, around 7:15 AM when the nurses went to check on her she was noted to be nonverbal, paralyzed on the right side with gaze preference to the left.  Code stroke was activated and patient was immediately taken to CT scan.On assessment, NIH stroke scale was 25.  Stat CT head was obtained which showed a right subdural hemorrhage.  CT angiogram showed a left M1 occlusion and patient was taken to mechanical thrombectomy after obtaining consent from daughter.Patient's baseline prior to admission was she was living at home, independent for most ADLs.  Needed a walker to walk due to shortness of breath after a few steps.  She had a few falls prior to admission which likely explains right-sided acute subdural hemorrhage. Date last known well: 2.10. 2021 Time last known well: 6:15 AM tPA Given: no, subdural hemorrhage NIHSS: 25 Baseline MRS 2 patient was taken for emergent mechanical thrombectomy and underwent successful revascularization of the occluded left M1 segment.  She was admitted to the neurological intensive care  unit where blood pressure was tightly controlled.  She was extubated and did well.  MRI scan of the brain subsequently showed 2 small left parietal and caudate and lentiform nucleus infarcts the study was suboptimal and incomplete.  There is a small subdural noted in the right frontal convexity which is stable.  MRA showed restored flow in the left M1 with changes of atherosclerosis in bilateral cavernous carotids and right V4 segment and moderate stenosis.  Lower extremity venous Dopplers were negative for DVT.  2D echo showed normal ejection fraction.  LDL cholesterol was 55 mg percent.  Hemoglobin A1c was 5.6.  Patient was kept on telemetry monitoring and no A. fib was found.  She did not get a loop recorder as she had subdural hemorrhage and was not candidate for short-term anticoagulation at that time.  Patient was seen by physical occupational therapy and was felt stable enough to begin discharge to skilled nursing facility for ongoing therapies.  She states she is done well since discharge.  She is currently living at home.  She has made full recovery.  She still gets short of breath with walking and uses a walker and feels her balance may not be the same.  She still has some leg swelling.  But she denies any neurological symptoms or deficits.  She has remote history of seizures and she is on Tegretol and has remained stable for years without breakthrough seizures.  ROS:   14 system review of systems is positive for shortness of breath,, gait difficulty, imbalance, leg swelling and all other systems negative PMH:  Past Medical History:  Diagnosis Date  .  1st degree AV block 05/02/2017  . Anemia   . Arterial embolism and thrombosis of upper extremity ([place])   . Chronic diastolic (congestive) heart failure ([place])    a. 04/2017 Echo: EF 60-65%. Gr1 DD. Mod LVH.   . CKD (chronic kidney disease) stage 4, GFR 15-29 ml/min (HCC) 05/02/2017  . Depression   . Diabetes mellitus type 2, insulin dependent ([place])  05/02/2017  . DVT (deep venous thrombosis) ([place])   . Dyslipidemia 05/02/2017  . Essential hypertension   . GERD (gastroesophageal reflux disease)   . Hyperlipidemia   . Junctional bradycardia   . Morbid obesity with BMI of 45.0-49.9, adult ([place]) 05/02/2017  . OSA (obstructive sleep apnea) 10/17/2012  . Rhabdomyolysis 09/25/2012  . Seizures ([place])    "epilepsy; they think I had one a couple months ago" (12/11/2014)  . Symptomatic bradycardia 11/27/2016    Social History:  Social History   Socioeconomic History  . Marital status: Divorced    Spouse name: Not on file  . Number of children: 2  . Years of education: 10  . Highest education level: Not on file  Occupational History  . Occupation: Retired  Tobacco Use  . Smoking status: Never Smoker  . Smokeless tobacco: Never Used  . Tobacco comment: EXPOSED TO 2ND HAND SMOKE X 20+ YEARS.   Substance and Sexual Activity  . Alcohol use: No  . Drug use: No  . Sexual activity: Never  Other Topics Concern  . Not on file  Social History Narrative   Lives in [place] by herself.  Daughters nearby.  Sedentary.   Eats fast food for at least one meal almost daily.   Caffeine use:  Drinks soda daily.   Coffee rarely.    Social Determinants of Health   Financial Resource Strain:   . Difficulty of Paying Living Expenses:   Food Insecurity:   . Worried About Charity fundraiser in the Last Year:   . Arboriculturist in the Last Year:   Transportation Needs:   . Film/video editor (Medical):   [name] Lack of Transportation (Non-Medical):   Physical Activity:   . Days of Exercise per Week:   . Minutes of Exercise per Session:   Stress:   . Feeling of Stress :   Social Connections:   . Frequency of Communication with Friends and Family:   . Frequency of Social Gatherings with Friends and Family:   . Attends Religious Services:   . Active Member of Clubs or Organizations:   . Attends Archivist Meetings:   [name] Marital Status:   Intimate  Partner Violence:   . Fear of Current or Ex-Partner:   . Emotionally Abused:   [name] Physically Abused:   . Sexually Abused:     Medications:   Current Outpatient Medications on File Prior to Visit  Medication Sig Dispense Refill  . ACCU-CHEK AVIVA PLUS test strip 3 (three) times daily. for testing    . Accu-Chek Softclix Lancets lancets See admin instructions.    . Ascorbic Acid (VITAMIN C PO) Take 1 tablet by mouth daily.    [name] aspirin 81 MG chewable tablet Chew 1 tablet (81 mg total) by mouth daily.    . B-D ULTRAFINE III SHORT PEN 31G X 8 MM MISC See admin instructions.    . bisacodyl (DULCOLAX) 5 MG EC tablet Take 2 tablets (10 mg total) by mouth daily as needed.    . carbamazepine (TEGRETOL) 200 MG tablet Take 400  mg by mouth 2 (two) times daily.     . Cholecalciferol (VITAMIN D-3) 1000 UNITS CAPS Take 1,000 Units by mouth daily at 12 noon.     . citalopram (CELEXA) 20 MG tablet Take 20 mg by mouth daily.    . DUREZOL 0.05 % EMUL Place 1 drop into the right eye 3 (three) times daily.    [name] ezetimibe-simvastatin (VYTORIN) 10-40 MG per tablet Take 1 tablet by mouth daily.    . furosemide (LASIX) 80 MG tablet Take 0.5 tablets (40 mg total) by mouth daily.    [name] glipiZIDE (GLUCOTROL XL) 5 MG 24 hr tablet Take 5 mg by mouth daily with breakfast.     . HUMULIN 70/30 KWIKPEN (70-30) 100 UNIT/ML PEN Inject 5 Units into the skin 2 (two) times daily.    . mirtazapine (REMERON) 15 MG tablet Take 15 mg by mouth at bedtime.     [name] omeprazole (PRILOSEC) 40 MG capsule Take 40 mg by mouth daily.    . sodium bicarbonate 650 MG tablet Take 2 tablets (1,300 mg total) by mouth 3 (three) times daily. 90 tablet 0   No current facility-administered medications on file prior to visit.    Allergies:   Allergies  Allergen Reactions  . Codeine Nausea And Vomiting  . Metoprolol Other (See Comments)    Severe bradycardia.  Now getting admitted for 3rd time in 3 years for bradycardia onset after being started on  toprol.  Please dont put patient on this med anymore!    Physical Exam General: Morbidly obese elderly African-American lady seated, in no evident distress Head: head normocephalic and atraumatic.   Neck: supple with no carotid or supraclavicular bruits Cardiovascular: regular rate and rhythm, no murmurs Musculoskeletal: no deformity Skin:  no rash/petichiae Vascular:  Normal pulses all extremities  Neurologic Exam Mental Status: Awake and fully alert. Oriented to place and time. Recent and remote memory intact. Attention span, concentration and fund of knowledge appropriate. Mood and affect appropriate.  Cranial Nerves: Fundoscopic exam reveals sharp disc margins. Pupils equal, briskly reactive to light. Extraocular movements full without nystagmus. Visual fields full to confrontation. Hearing intact. Facial sensation intact. Face, tongue, palate moves normally and symmetrically.  Motor: Normal bulk and tone. Normal strength in all tested extremity muscles. Sensory.: intact to touch , pinprick , position and vibratory sensation.  Coordination: Rapid alternating movements normal in all extremities. Finger-to-nose and heel-to-shin performed accurately bilaterally. Gait and Station: Arises from chair without difficulty. Stance is normal. Gait demonstrates normal stride length and balance . Able to heel, toe and tandem walk with great difficulty.  Reflexes: 1+ and symmetric. Toes downgoing.   NIHSS  0 Modified Rankin  1   ASSESSMENT: 74 year old African-American lady with embolic left middle cerebral artery infarct and February 2021 treated with successful mechanical thrombectomy with excellent clinical outcome.  Vascular risk factors of obesity, diabetes, hypertension hyperlipidemia.  She had small subdural hematoma during her presentation with stroke.  Remote history of seizure disorder which is stable on current dose of Tegretol 400 twice daily     PLAN: I had a long d/w patient and  her grand daughter about her recent cryptogenic stroke, risk for recurrent stroke/TIAs, personally independently reviewed imaging studies and stroke evaluation results and answered questions.Continue aspirin 81 mg daily  for secondary stroke prevention and maintain strict control of hypertension with blood pressure goal below 130/90, diabetes with hemoglobin A1c goal below 6.5% and lipids with LDL cholesterol goal below 70 mg/dL. I  also advised the patient to eat a healthy diet with plenty of whole grains, cereals, fruits and vegetables, exercise regularly and maintain ideal body weight.  Check follow-up CT scan of the head to look for resolution of subdural hematoma.  Refer to cardiology for outpatient loop recorder for paroxysmal A. fib.  Continue Tegretol in the current dose of 400 mg twice daily for seizure disorder which appears stable greater than 50% time during this 50-minute consultation visit was spent on counseling and coordination of care about her cryptogenic stroke and discussion about evaluation and treatment and answering questions.  Followup in the future with my nurse practitioner [name] in 3 months or call earlier if necessary. Antony Contras, MD  [facility] [address] [place], [place] [address]  Phone [phone] Fax [phone] Note: This document was prepared with digital dictation and possible smart phrase technology. Any transcriptional errors that result from this process are unintentional.

## 2019-05-02 NOTE — Patient Instructions (Signed)
I had a long d/w patient and her grand daughter about her recent cryptogenic stroke, risk for recurrent stroke/TIAs, personally independently reviewed imaging studies and stroke evaluation results and answered questions.Continue aspirin 81 mg daily  for secondary stroke prevention and maintain strict control of hypertension with blood pressure goal below 130/90, diabetes with hemoglobin A1c goal below 6.5% and lipids with LDL cholesterol goal below 70 mg/dL. I also advised the patient to eat a healthy diet with plenty of whole grains, cereals, fruits and vegetables, exercise regularly and maintain ideal body weight.  Check follow-up CT scan of the head to look for resolution of subdural hematoma.  Refer to cardiology for outpatient loop recorder for paroxysmal A. fib.  Followup in the future with my nurse practitioner Janett Billow in 3 months or call earlier if necessary.  Stroke Prevention Some medical conditions and behaviors are associated with a higher chance of having a stroke. You can help prevent a stroke by making nutrition, lifestyle, and other changes, including managing any medical conditions you may have. What nutrition changes can be made?   Eat healthy foods. You can do this by: ? Choosing foods high in fiber, such as fresh fruits and vegetables and whole grains. ? Eating at least 5 or more servings of fruits and vegetables a day. Try to fill half of your plate at each meal with fruits and vegetables. ? Choosing lean protein foods, such as lean cuts of meat, poultry without skin, fish, tofu, beans, and nuts. ? Eating low-fat dairy products. ? Avoiding foods that are high in salt (sodium). This can help lower blood pressure. ? Avoiding foods that have saturated fat, trans fat, and cholesterol. This can help prevent high cholesterol. ? Avoiding processed and premade foods.  Follow your health care provider's specific guidelines for losing weight, controlling high blood pressure (hypertension),  lowering high cholesterol, and managing diabetes. These may include: ? Reducing your daily calorie intake. ? Limiting your daily sodium intake to 1,500 milligrams (mg). ? Using only healthy fats for cooking, such as olive oil, canola oil, or sunflower oil. ? Counting your daily carbohydrate intake. What lifestyle changes can be made?  Maintain a healthy weight. Talk to your health care provider about your ideal weight.  Get at least 30 minutes of moderate physical activity at least 5 days a week. Moderate activity includes brisk walking, biking, and swimming.  Do not use any products that contain nicotine or tobacco, such as cigarettes and e-cigarettes. If you need help quitting, ask your health care provider. It may also be helpful to avoid exposure to secondhand smoke.  Limit alcohol intake to no more than 1 drink a day for nonpregnant women and 2 drinks a day for men. One drink equals 12 oz of beer, 5 oz of wine, or 1 oz of hard liquor.  Stop any illegal drug use.  Avoid taking birth control pills. Talk to your health care provider about the risks of taking birth control pills if: ? You are over 15 years old. ? You smoke. ? You get migraines. ? You have ever had a blood clot. What other changes can be made?  Manage your cholesterol levels. ? Eating a healthy diet is important for preventing high cholesterol. If cholesterol cannot be managed through diet alone, you may also need to take medicines. ? Take any prescribed medicines to control your cholesterol as told by your health care provider.  Manage your diabetes. ? Eating a healthy diet and exercising regularly are important  parts of managing your blood sugar. If your blood sugar cannot be managed through diet and exercise, you may need to take medicines. ? Take any prescribed medicines to control your diabetes as told by your health care provider.  Control your hypertension. ? To reduce your risk of stroke, try to keep your  blood pressure below 130/80. ? Eating a healthy diet and exercising regularly are an important part of controlling your blood pressure. If your blood pressure cannot be managed through diet and exercise, you may need to take medicines. ? Take any prescribed medicines to control hypertension as told by your health care provider. ? Ask your health care provider if you should monitor your blood pressure at home. ? Have your blood pressure checked every year, even if your blood pressure is normal. Blood pressure increases with age and some medical conditions.  Get evaluated for sleep disorders (sleep apnea). Talk to your health care provider about getting a sleep evaluation if you snore a lot or have excessive sleepiness.  Take over-the-counter and prescription medicines only as told by your health care provider. Aspirin or blood thinners (antiplatelets or anticoagulants) may be recommended to reduce your risk of forming blood clots that can lead to stroke.  Make sure that any other medical conditions you have, such as atrial fibrillation or atherosclerosis, are managed. What are the warning signs of a stroke? The warning signs of a stroke can be easily remembered as BEFAST.  B is for balance. Signs include: ? Dizziness. ? Loss of balance or coordination. ? Sudden trouble walking.  E is for eyes. Signs include: ? A sudden change in vision. ? Trouble seeing.  F is for face. Signs include: ? Sudden weakness or numbness of the face. ? The face or eyelid drooping to one side.  A is for arms. Signs include: ? Sudden weakness or numbness of the arm, usually on one side of the body.  S is for speech. Signs include: ? Trouble speaking (aphasia). ? Trouble understanding.  T is for time. ? These symptoms may represent a serious problem that is an emergency. Do not wait to see if the symptoms will go away. Get medical help right away. Call your local emergency services (911 in the [place] Do not  drive yourself to the hospital.  Other signs of stroke may include: ? A sudden, severe headache with no known cause. ? Nausea or vomiting. ? Seizure. Where to find more information For more information, visit:  American Stroke Association: www.strokeassociation.org  National Stroke Association: www.stroke.org Summary  You can prevent a stroke by eating healthy, exercising, not smoking, limiting alcohol intake, and managing any medical conditions you may have.  Do not use any products that contain nicotine or tobacco, such as cigarettes and e-cigarettes. If you need help quitting, ask your health care provider. It may also be helpful to avoid exposure to secondhand smoke.  Remember BEFAST for warning signs of stroke. Get help right away if you or a loved one has any of these signs. This information is not intended to replace advice given to you by your health care provider. Make sure you discuss any questions you have with your health care provider. Document Revised: 12/24/2016 Document Reviewed: 02/17/2016 Elsevier Patient Education  2020 [employer].

## 2019-05-02 NOTE — Telephone Encounter (Signed)
[employer] medicare/medicaid order sent to GI. No auth they will reach out to the patient to schedule.  °

## 2019-05-22 DIAGNOSIS — K219 Gastro-esophageal reflux disease without esophagitis: Secondary | ICD-10-CM | POA: Diagnosis not present

## 2019-05-22 DIAGNOSIS — I10 Essential (primary) hypertension: Secondary | ICD-10-CM | POA: Diagnosis not present

## 2019-05-22 DIAGNOSIS — E1159 Type 2 diabetes mellitus with other circulatory complications: Secondary | ICD-10-CM | POA: Diagnosis not present

## 2019-05-22 DIAGNOSIS — I509 Heart failure, unspecified: Secondary | ICD-10-CM | POA: Diagnosis not present

## 2019-06-06 ENCOUNTER — Encounter: Payer: Self-pay | Admitting: Internal Medicine

## 2019-06-06 ENCOUNTER — Other Ambulatory Visit: Payer: Self-pay

## 2019-06-06 ENCOUNTER — Ambulatory Visit (INDEPENDENT_AMBULATORY_CARE_PROVIDER_SITE_OTHER): Payer: Medicare Other | Admitting: Internal Medicine

## 2019-06-06 VITALS — BP 162/74 | HR 71 | Ht 62.0 in | Wt 234.8 lb

## 2019-06-06 DIAGNOSIS — I639 Cerebral infarction, unspecified: Secondary | ICD-10-CM | POA: Diagnosis not present

## 2019-06-06 DIAGNOSIS — I130 Hypertensive heart and chronic kidney disease with heart failure and stage 1 through stage 4 chronic kidney disease, or unspecified chronic kidney disease: Secondary | ICD-10-CM | POA: Diagnosis not present

## 2019-06-06 DIAGNOSIS — I5032 Chronic diastolic (congestive) heart failure: Secondary | ICD-10-CM

## 2019-06-06 HISTORY — PX: OTHER SURGICAL HISTORY: SHX169

## 2019-06-06 NOTE — Patient Instructions (Signed)
Medication Instructions:  Your physician recommends that you continue on your current medications as directed. Please refer to the Current Medication list given to you today.  *If you need a refill on your cardiac medications before your next appointment, please call your pharmacy*   Lab Work: None ordered   Testing/Procedures: None ordered   Follow-Up: At [facility], you and your health needs are our priority.  As part of our continuing mission to provide you with exceptional heart care, we have created designated Provider Care Teams.  These Care Teams include your primary Cardiologist (physician) and Advanced Practice Providers (APPs -  Physician Assistants and Nurse Practitioners) who all work together to provide you with the care you need, when you need it.  We recommend signing up for the patient portal called "MyChart".  Sign up information is provided on this After Visit Summary.  MyChart is used to connect with patients for Virtual Visits (Telemedicine).  Patients are able to view lab/test results, encounter notes, upcoming appointments, etc.  Non-urgent messages can be sent to your provider as well.   To learn more about what you can do with MyChart, go to [email or web address].    Your next appointment:    As needed  The format for your next appointment:   In Person  Provider:   Thompson Grayer, MD   Thank you for choosing [facility]!!     Other Instructions   Implantable Loop Recorder Placement, Care After This sheet gives you information about how to care for yourself after your procedure. Your health care provider may also give you more specific instructions. If you have problems or questions, contact your health care provider. What can I expect after the procedure? After the procedure, it is common to have:  Soreness or discomfort near the incision.  Some swelling or bruising near the incision. Follow these instructions at home: Incision  care   Follow instructions from your health care provider about how to take care of your incision. Make sure you: ? Wash your hands with soap and water before you change your bandage (dressing). If soap and water are not available, use hand sanitizer. ? Change your dressing as told by your health care provider. ? Keep your dressing dry. ? Leave stitches (sutures), skin glue, or adhesive strips in place. These skin closures may need to stay in place for 2 weeks or longer. If adhesive strip edges start to loosen and curl up, you may trim the loose edges. Do not remove adhesive strips completely unless your health care provider tells you to do that.  Check your incision area every day for signs of infection. Check for: ? Redness, swelling, or pain. ? Fluid or blood. ? Warmth. ? Pus or a bad smell.  Do not take baths, swim, or use a hot tub until your health care provider approves. Ask your health care provider if you can take showers. Activity   Return to your normal activities as told by your health care provider. Ask your health care provider what activities are safe for you.  Do not drive for 24 hours if you were given a sedative during your procedure. General instructions  Follow instructions from your health care provider about how to manage your implantable loop recorder and transmit the information. Learn how to activate a recording if this is necessary for your type of device.  Do not go through a metal detection gate, and do not let someone hold a metal detector over your chest. Show  your ID card.  Do not have an MRI unless you check with your health care provider first.  Take over-the-counter and prescription medicines only as told by your health care provider.  Keep all follow-up visits as told by your health care provider. This is important. Contact a health care provider if:  You have redness, swelling, or pain around your incision.  You have a fever.  You have pain  that is not relieved by your pain medicine.  You have triggered your device because of fainting (syncope) or because of a heartbeat that feels like it is racing, slow, fluttering, or skipping (palpitations). Get help right away if you have:  Chest pain.  Difficulty breathing. Summary  After the procedure, it is common to have soreness or discomfort near the incision.  Change your dressing as told by your health care provider.  Follow instructions from your health care provider about how to manage your implantable loop recorder and transmit the information.  Keep all follow-up visits as told by your health care provider. This is important. This information is not intended to replace advice given to you by your health care provider. Make sure you discuss any questions you have with your health care provider. Document Revised: 02/26/2017 Document Reviewed: 02/26/2017 Elsevier Patient Education  2020 [employer].

## 2019-06-06 NOTE — Progress Notes (Signed)
PCP: Benito Mccreedy, MD   Primary EP: Dr Rayann Heman  [name] is a 74 y.o. female who presents today for routine electrophysiology followup.  Since I saw her in the hospital, the patient reports doing very well.  She has made good recovery from her stroke.  She has HTN, obesity, PFpEF, and stage IV renal faiulre. Today, she denies symptoms of palpitations, chest pain, shortness of breath,   dizziness, presyncope, or syncope.  Edema is stable.  The patient is otherwise without complaint today.   Past Medical History:  Diagnosis Date  . 1st degree AV block 05/02/2017  . Anemia   . Arterial embolism and thrombosis of upper extremity ([place])   . Chronic diastolic (congestive) heart failure ([place])    a. 04/2017 Echo: EF 60-65%. Gr1 DD. Mod LVH.   . CKD (chronic kidney disease) stage 4, GFR 15-29 ml/min ([facility]) 05/02/2017  . Depression   . Diabetes mellitus type 2, insulin dependent ([place]) 05/02/2017  . DVT (deep venous thrombosis) ([place])   . Dyslipidemia 05/02/2017  . Essential hypertension   . GERD (gastroesophageal reflux disease)   . Hyperlipidemia   . Junctional bradycardia   . Morbid obesity with BMI of 45.0-49.9, adult ([place]) 05/02/2017  . OSA (obstructive sleep apnea) 10/17/2012  . Rhabdomyolysis 09/25/2012  . Seizures ([place])    "epilepsy; they think I had one a couple months ago" (12/11/2014)  . Symptomatic bradycardia 11/27/2016   Past Surgical History:  Procedure Laterality Date  . IR CT HEAD LTD  03/07/2019  . IR PERCUTANEOUS ART THROMBECTOMY/INFUSION INTRACRANIAL INC DIAG ANGIO  03/07/2019  . RADIOLOGY WITH ANESTHESIA N/A 03/07/2019   Procedure: IR WITH ANESTHESIA;  Surgeon: Radiologist, Medication, MD;  Location: [facility];  Service: Radiology;  Laterality: N/A;  . THROMBECTOMY BRACHIAL ARTERY Left 12/15/2014   Procedure: THROMBECTOMY BRACHIAL ARTERY; VEIN PATCH ANGIOPLASTY LEFT BRACIAL ARTERY;  Surgeon: Angelia Mould, MD;  Location: [place];  Service: Vascular;  Laterality: Left;  .  TUBAL LIGATION      ROS- all systems are reviewed and negatives except as per HPI above  Current Outpatient Medications  Medication Sig Dispense Refill  . ACCU-CHEK AVIVA PLUS test strip 3 (three) times daily. for testing    . Accu-Chek Softclix Lancets lancets See admin instructions.    . Ascorbic Acid (VITAMIN C PO) Take 1 tablet by mouth daily.    [name] aspirin 81 MG chewable tablet Chew 1 tablet (81 mg total) by mouth daily.    . B-D ULTRAFINE III SHORT PEN 31G X 8 MM MISC See admin instructions.    . bisacodyl (DULCOLAX) 5 MG EC tablet Take 2 tablets (10 mg total) by mouth daily as needed.    . carbamazepine (TEGRETOL) 200 MG tablet Take 400 mg by mouth 2 (two) times daily.     . Cholecalciferol (VITAMIN D-3) 1000 UNITS CAPS Take 1,000 Units by mouth daily at 12 noon.     . citalopram (CELEXA) 20 MG tablet Take 20 mg by mouth daily.    . DUREZOL 0.05 % EMUL Place 1 drop into the right eye 3 (three) times daily.    [name] ezetimibe-simvastatin (VYTORIN) 10-40 MG per tablet Take 1 tablet by mouth daily.    . furosemide (LASIX) 80 MG tablet Take 0.5 tablets (40 mg total) by mouth daily.    [name] glipiZIDE (GLUCOTROL XL) 5 MG 24 hr tablet Take 5 mg by mouth daily with breakfast.     . HUMULIN 70/30 KWIKPEN (70-30)  100 UNIT/ML PEN Inject 5 Units into the skin 2 (two) times daily.    . mirtazapine (REMERON) 15 MG tablet Take 15 mg by mouth at bedtime.     [name] omeprazole (PRILOSEC) 40 MG capsule Take 40 mg by mouth daily.    . sodium bicarbonate 650 MG tablet Take 2 tablets (1,300 mg total) by mouth 3 (three) times daily. 90 tablet 0   No current facility-administered medications for this visit.    Physical Exam: Vitals:   06/06/19 1049  BP: (!) 162/74  Pulse: 71  SpO2: 96%  Weight: 234 lb 12.8 oz (106.5 kg)  Height: 5\' 2"  (1.575 m)    GEN- The patient is chronically ill appearing, alert and oriented x 3 today.   Head- normocephalic, atraumatic Eyes-  Sclera clear, conjunctiva pink Ears-  hearing intact Oropharynx- clear Lungs-   normal work of breathing Heart- Regular rate and rhythm,  GI- soft, NT, ND, + BS Extremities- no clubbing, cyanosis, or edema  Wt Readings from Last 3 Encounters:  06/06/19 234 lb 12.8 oz (106.5 kg)  05/02/19 239 lb (108.4 kg)  03/14/19 250 lb 10.6 oz (113.7 kg)    EKG tracing ordered today is personally reviewed and shows sinus rhythm 71 bpm, PR 186 msec, QRS 82 msec, QTc 443 msec  Assessment and Plan:  1. Cryptogenic stroke Unclear etiology She has previously worn telemetry without cause identified Dr Leonie Man is concerned about afib as a possible cause (05/02/19 note reviewed)  I would therefore advise implantation of an implantable loop recorder for long term arrhythmia monitoring.  Risks and benefits to ILR were discussed at length with the patient today, including but not limited to risks of bleeding and infection.  Extensive device education was performed.  Remote monitoring was also discussed at length today.  The patient understands and wishes to proceed.  We will proceed at this time with ILR implantation.  2. Sinus bradycardia Resolved No indication for pacing  3. Chronic diastolic dysfunction Complicated by renal failure  4. Hypertensive cardiovascular heart disease with diastolic dysfunction and stage IV renal disease Continue current medical therapy  Thompson Grayer MD, [facility] 06/06/2019 11:08 AM      DESCRIPTION OF PROCEDURE:  Informed written consent was obtained.  The patient required no sedation for the procedure today.  The patients left chest was prepped and draped. Mapping over the patient's chest was performed to identify the appropriate ILR site.  This area was found to be the left parasternal region over the 3rd-4th intercostal space.  The skin overlying this region was infiltrated with lidocaine for local analgesia.  A 0.5-cm incision was made at the implant site.  A subcutaneous ILR pocket was fashioned using a  combination of sharp and blunt dissection.  A Medtronic Reveal Linq model LNQ 11 SN [id number] implantable loop recorder was then placed into the pocket R waves were very prominent and measured > 0.2 mV. EBL<1 ml.  Steri- Strips and a sterile dressing were then applied.  There were no early apparent complications.     CONCLUSIONS:   1. Successful implantation of a Medtronic Reveal LINQ implantable loop recorder for cryptogenic stroke  2. No early apparent complications.   Thompson Grayer MD, [facility] 06/06/2019 11:08 AM

## 2019-06-19 DIAGNOSIS — N179 Acute kidney failure, unspecified: Secondary | ICD-10-CM | POA: Diagnosis not present

## 2019-06-19 DIAGNOSIS — I5032 Chronic diastolic (congestive) heart failure: Secondary | ICD-10-CM | POA: Diagnosis not present

## 2019-06-19 DIAGNOSIS — N184 Chronic kidney disease, stage 4 (severe): Secondary | ICD-10-CM | POA: Diagnosis not present

## 2019-06-19 DIAGNOSIS — D631 Anemia in chronic kidney disease: Secondary | ICD-10-CM | POA: Diagnosis not present

## 2019-06-19 DIAGNOSIS — I129 Hypertensive chronic kidney disease with stage 1 through stage 4 chronic kidney disease, or unspecified chronic kidney disease: Secondary | ICD-10-CM | POA: Diagnosis not present

## 2019-06-20 ENCOUNTER — Other Ambulatory Visit: Payer: Self-pay

## 2019-06-20 NOTE — Patient Outreach (Signed)
Telephone outreach to patient to obtain mRS was successfully completed. MRS=1  Ina Homes [facility] Management Assistant [phone]

## 2019-07-04 DIAGNOSIS — I10 Essential (primary) hypertension: Secondary | ICD-10-CM | POA: Diagnosis not present

## 2019-07-04 DIAGNOSIS — I509 Heart failure, unspecified: Secondary | ICD-10-CM | POA: Diagnosis not present

## 2019-07-04 DIAGNOSIS — K219 Gastro-esophageal reflux disease without esophagitis: Secondary | ICD-10-CM | POA: Diagnosis not present

## 2019-07-04 DIAGNOSIS — E1159 Type 2 diabetes mellitus with other circulatory complications: Secondary | ICD-10-CM | POA: Diagnosis not present

## 2019-07-09 ENCOUNTER — Ambulatory Visit (INDEPENDENT_AMBULATORY_CARE_PROVIDER_SITE_OTHER): Payer: Medicare Other | Admitting: *Deleted

## 2019-07-09 DIAGNOSIS — I639 Cerebral infarction, unspecified: Secondary | ICD-10-CM | POA: Diagnosis not present

## 2019-07-09 LAB — CUP PACEART REMOTE DEVICE CHECK
Date Time Interrogation Session: 20210614002858
Implantable Pulse Generator Implant Date: 20210512

## 2019-07-10 NOTE — Progress Notes (Signed)
Carelink Summary Report / Loop Recorder 

## 2019-07-11 ENCOUNTER — Other Ambulatory Visit: Payer: Self-pay

## 2019-07-11 ENCOUNTER — Encounter: Payer: Self-pay | Admitting: Podiatry

## 2019-07-11 ENCOUNTER — Ambulatory Visit (INDEPENDENT_AMBULATORY_CARE_PROVIDER_SITE_OTHER): Payer: Medicare Other | Admitting: Podiatry

## 2019-07-11 DIAGNOSIS — E119 Type 2 diabetes mellitus without complications: Secondary | ICD-10-CM

## 2019-07-11 DIAGNOSIS — B351 Tinea unguium: Secondary | ICD-10-CM | POA: Diagnosis not present

## 2019-07-11 DIAGNOSIS — M79674 Pain in right toe(s): Secondary | ICD-10-CM

## 2019-07-11 DIAGNOSIS — N179 Acute kidney failure, unspecified: Secondary | ICD-10-CM

## 2019-07-11 DIAGNOSIS — M79675 Pain in left toe(s): Secondary | ICD-10-CM | POA: Diagnosis not present

## 2019-07-11 DIAGNOSIS — N184 Chronic kidney disease, stage 4 (severe): Secondary | ICD-10-CM

## 2019-07-11 NOTE — Progress Notes (Signed)
This patient returns to my office for at risk foot care.  This patient requires this care by a professional since this patient will be at risk due to having diabetes mellitus type 2,  CKD and acute renal failure.  This patient is unable to cut nails herself since the patient cannot reach her nails.These nails are painful walking and wearing shoes.  This patient presents for at risk foot care today.  General Appearance  Alert, conversant and in no acute stress.  Vascular  Dorsalis pedis and posterior tibial  pulses are weakly  palpable  bilaterally.  Capillary return is within normal limits  bilaterally. Temperature is within normal limits  bilaterally.  Neurologic  Senn-Weinstein monofilament wire test within normal limits  bilaterally. Muscle power within normal limits bilaterally.  Nails Thick disfigured discolored nails with subungual debris  from hallux to fifth toes bilaterally. No evidence of bacterial infection or drainage bilaterally.  Orthopedic  No limitations of motion  feet .  No crepitus or effusions noted.  No bony pathology or digital deformities noted.  Skin  normotropic skin with no porokeratosis noted bilaterally.  No signs of infections or ulcers noted.     Onychomycosis  Pain in right toes  Pain in left toes  Consent was obtained for treatment procedures.   Mechanical debridement of nails 1-5  bilaterally performed with a nail nipper.  Filed with dremel without incident.    Return office visit  3 months                    Told patient to return for periodic foot care and evaluation due to potential at risk complications.   Gardiner Barefoot DPM

## 2019-07-21 ENCOUNTER — Other Ambulatory Visit: Payer: Self-pay

## 2019-07-21 ENCOUNTER — Emergency Department (HOSPITAL_COMMUNITY): Payer: Medicare Other

## 2019-07-21 ENCOUNTER — Encounter (HOSPITAL_COMMUNITY): Payer: Self-pay | Admitting: Emergency Medicine

## 2019-07-21 ENCOUNTER — Observation Stay (HOSPITAL_BASED_OUTPATIENT_CLINIC_OR_DEPARTMENT_OTHER)
Admission: EM | Admit: 2019-07-21 | Discharge: 2019-07-22 | Disposition: A | Discharge status: Home / Self Care | Payer: Medicare Other | Source: Home / Self Care | Attending: Emergency Medicine | Admitting: Emergency Medicine

## 2019-07-21 DIAGNOSIS — I5032 Chronic diastolic (congestive) heart failure: Secondary | ICD-10-CM | POA: Diagnosis present

## 2019-07-21 DIAGNOSIS — G40909 Epilepsy, unspecified, not intractable, without status epilepticus: Secondary | ICD-10-CM | POA: Insufficient documentation

## 2019-07-21 DIAGNOSIS — E871 Hypo-osmolality and hyponatremia: Secondary | ICD-10-CM | POA: Insufficient documentation

## 2019-07-21 DIAGNOSIS — Z743 Need for continuous supervision: Secondary | ICD-10-CM | POA: Diagnosis not present

## 2019-07-21 DIAGNOSIS — R55 Syncope and collapse: Secondary | ICD-10-CM | POA: Diagnosis present

## 2019-07-21 DIAGNOSIS — E1122 Type 2 diabetes mellitus with diabetic chronic kidney disease: Secondary | ICD-10-CM | POA: Insufficient documentation

## 2019-07-21 DIAGNOSIS — R739 Hyperglycemia, unspecified: Secondary | ICD-10-CM

## 2019-07-21 DIAGNOSIS — K219 Gastro-esophageal reflux disease without esophagitis: Secondary | ICD-10-CM | POA: Insufficient documentation

## 2019-07-21 DIAGNOSIS — R404 Transient alteration of awareness: Secondary | ICD-10-CM | POA: Diagnosis not present

## 2019-07-21 DIAGNOSIS — R0902 Hypoxemia: Secondary | ICD-10-CM | POA: Diagnosis not present

## 2019-07-21 DIAGNOSIS — E1165 Type 2 diabetes mellitus with hyperglycemia: Secondary | ICD-10-CM | POA: Diagnosis not present

## 2019-07-21 DIAGNOSIS — Z79899 Other long term (current) drug therapy: Secondary | ICD-10-CM | POA: Insufficient documentation

## 2019-07-21 DIAGNOSIS — Z885 Allergy status to narcotic agent status: Secondary | ICD-10-CM | POA: Insufficient documentation

## 2019-07-21 DIAGNOSIS — N185 Chronic kidney disease, stage 5: Secondary | ICD-10-CM | POA: Insufficient documentation

## 2019-07-21 DIAGNOSIS — Z20822 Contact with and (suspected) exposure to covid-19: Secondary | ICD-10-CM | POA: Insufficient documentation

## 2019-07-21 DIAGNOSIS — E785 Hyperlipidemia, unspecified: Secondary | ICD-10-CM | POA: Insufficient documentation

## 2019-07-21 DIAGNOSIS — R42 Dizziness and giddiness: Secondary | ICD-10-CM | POA: Diagnosis not present

## 2019-07-21 DIAGNOSIS — Z6841 Body Mass Index (BMI) 40.0 and over, adult: Secondary | ICD-10-CM | POA: Insufficient documentation

## 2019-07-21 DIAGNOSIS — D631 Anemia in chronic kidney disease: Secondary | ICD-10-CM | POA: Insufficient documentation

## 2019-07-21 DIAGNOSIS — R569 Unspecified convulsions: Secondary | ICD-10-CM

## 2019-07-21 DIAGNOSIS — Z794 Long term (current) use of insulin: Secondary | ICD-10-CM | POA: Insufficient documentation

## 2019-07-21 DIAGNOSIS — G4733 Obstructive sleep apnea (adult) (pediatric): Secondary | ICD-10-CM | POA: Insufficient documentation

## 2019-07-21 DIAGNOSIS — D649 Anemia, unspecified: Secondary | ICD-10-CM | POA: Diagnosis present

## 2019-07-21 DIAGNOSIS — I132 Hypertensive heart and chronic kidney disease with heart failure and with stage 5 chronic kidney disease, or end stage renal disease: Secondary | ICD-10-CM | POA: Insufficient documentation

## 2019-07-21 DIAGNOSIS — F329 Major depressive disorder, single episode, unspecified: Secondary | ICD-10-CM | POA: Insufficient documentation

## 2019-07-21 DIAGNOSIS — Z7982 Long term (current) use of aspirin: Secondary | ICD-10-CM | POA: Insufficient documentation

## 2019-07-21 DIAGNOSIS — I130 Hypertensive heart and chronic kidney disease with heart failure and stage 1 through stage 4 chronic kidney disease, or unspecified chronic kidney disease: Secondary | ICD-10-CM | POA: Diagnosis not present

## 2019-07-21 DIAGNOSIS — Z03818 Encounter for observation for suspected exposure to other biological agents ruled out: Secondary | ICD-10-CM | POA: Diagnosis not present

## 2019-07-21 DIAGNOSIS — I10 Essential (primary) hypertension: Secondary | ICD-10-CM | POA: Diagnosis present

## 2019-07-21 DIAGNOSIS — Z888 Allergy status to other drugs, medicaments and biological substances status: Secondary | ICD-10-CM | POA: Insufficient documentation

## 2019-07-21 DIAGNOSIS — Z86718 Personal history of other venous thrombosis and embolism: Secondary | ICD-10-CM | POA: Insufficient documentation

## 2019-07-21 DIAGNOSIS — N179 Acute kidney failure, unspecified: Secondary | ICD-10-CM | POA: Diagnosis present

## 2019-07-21 DIAGNOSIS — Z8673 Personal history of transient ischemic attack (TIA), and cerebral infarction without residual deficits: Secondary | ICD-10-CM | POA: Insufficient documentation

## 2019-07-21 LAB — CBC WITH DIFFERENTIAL/PLATELET
Abs Immature Granulocytes: 0.03 10*3/uL (ref 0.00–0.07)
Basophils Absolute: 0 10*3/uL (ref 0.0–0.1)
Basophils Relative: 0 %
Eosinophils Absolute: 0 10*3/uL (ref 0.0–0.5)
Eosinophils Relative: 0 %
HCT: 24.6 % — ABNORMAL LOW (ref 36.0–46.0)
Hemoglobin: 8.2 g/dL — ABNORMAL LOW (ref 12.0–15.0)
Immature Granulocytes: 1 %
Lymphocytes Relative: 7 %
Lymphs Abs: 0.4 10*3/uL — ABNORMAL LOW (ref 0.7–4.0)
MCH: 28.8 pg (ref 26.0–34.0)
MCHC: 33.3 g/dL (ref 30.0–36.0)
MCV: 86.3 fL (ref 80.0–100.0)
Monocytes Absolute: 0.5 10*3/uL (ref 0.1–1.0)
Monocytes Relative: 9 %
Neutro Abs: 4.6 10*3/uL (ref 1.7–7.7)
Neutrophils Relative %: 83 %
Platelets: 235 10*3/uL (ref 150–400)
RBC: 2.85 MIL/uL — ABNORMAL LOW (ref 3.87–5.11)
RDW: 13.8 % (ref 11.5–15.5)
WBC: 5.5 10*3/uL (ref 4.0–10.5)
nRBC: 0 % (ref 0.0–0.2)

## 2019-07-21 LAB — URINALYSIS, ROUTINE W REFLEX MICROSCOPIC
Bacteria, UA: NONE SEEN
Bilirubin Urine: NEGATIVE
Glucose, UA: >=500 mg/dL — AB
Hgb urine dipstick: NEGATIVE
Ketones, ur: NEGATIVE mg/dL
Leukocytes,Ua: NEGATIVE
Nitrite: NEGATIVE
Protein, ur: NEGATIVE mg/dL
Specific Gravity, Urine: 1.006 (ref 1.005–1.030)
pH: 5 (ref 5.0–8.0)

## 2019-07-21 NOTE — ED Provider Notes (Signed)
[facility] EMERGENCY DEPARTMENT Provider Note   CSN: [id number] Arrival date & time: 07/21/19  2227     History Chief Complaint  Patient presents with  . Near Syncope    [name] is a 74 y.o. female.  The history is provided by the patient and medical records.    74 y.o. F with hx of anemia, CHF, CKD, depression, DM, dyslipidemia, HTN, GERD, OSA, bradycardia, presenting to the ED after a near syncopal event.  Patient states she has felt fine all day up until this evening.  Got up this morning as usual, went to birthday party for her daughter and was there for most of the day.  States they were outside but because of the heat she did stay indoors.  States they got ready to leave and she was walking down a long ramp with her walker when all of a sudden she got very lightheaded, dizzy, and woozy headed.  She did not lose consciousness, did not fall to the ground, did not hit her head.  Patient states on arrival to ED she is feeling better.  States she has had spells like this in the past but not quite this bad.  She has otherwise been well.  No fevers or recent illness.  No chest pain or SOB.  No palpitations.  States she did eat and drink well today.    Past Medical History:  Diagnosis Date  . 1st degree AV block 05/02/2017  . Anemia   . Arterial embolism and thrombosis of upper extremity ([place])   . Chronic diastolic (congestive) heart failure ([place])    a. 04/2017 Echo: EF 60-65%. Gr1 DD. Mod LVH.   . CKD (chronic kidney disease) stage 4, GFR 15-29 ml/min (HCC) 05/02/2017  . Depression   . Diabetes mellitus type 2, insulin dependent ([place]) 05/02/2017  . DVT (deep venous thrombosis) ([place])   . Dyslipidemia 05/02/2017  . Essential hypertension   . GERD (gastroesophageal reflux disease)   . Hyperlipidemia   . Junctional bradycardia   . Morbid obesity with BMI of 45.0-49.9, adult ([place]) 05/02/2017  . OSA (obstructive sleep apnea) 10/17/2012  . Rhabdomyolysis 09/25/2012  .  Seizures ([place])    "epilepsy; they think I had one a couple months ago" (12/11/2014)  . Symptomatic bradycardia 11/27/2016    Patient Active Problem List   Diagnosis Date Noted  . Middle cerebral artery embolism, left 03/07/2019  . Acute on chronic diastolic CHF (congestive heart failure) ([place]) 03/04/2019  . Acute respiratory failure with hypoxia ([place]) 05/02/2017  . Seizures disorder 05/02/2017  . Diabetes mellitus type 2, insulin dependent ([place]) 05/02/2017  . Morbid obesity with BMI of 45.0-49.9, adult ([place]) 05/02/2017  . Anemia 05/02/2017  . CKD (chronic kidney disease) stage 4, GFR 15-29 ml/min (HCC) 05/02/2017  . Dyslipidemia 05/02/2017  . Acute hyperkalemia 05/02/2017  . 1st degree AV block 05/02/2017  . Symptomatic bradycardia 11/27/2016  . DVT (deep venous thrombosis) ([place]) 06/30/2015  . Bradycardia with 31-40 beats per minute   . Emesis   . Junctional bradycardia   . Arterial embolism and thrombosis of upper extremity ([place])   . Bradycardia 12/11/2014  . Vomiting 12/11/2014  . Acute renal failure ([place]) 12/11/2014  . CKD (chronic kidney disease), stage IV ([place])   . Essential hypertension   . Type II diabetes mellitus ([place])   . OSA (obstructive sleep apnea) 10/17/2012  . Elevated troponin 09/26/2012  . Acute encephalopathy 09/25/2012  . Tremor 09/25/2012  . Rhabdomyolysis  09/25/2012  . Morbid obesity ([place]) 09/16/2006  . DEPRESSION, CHRONIC 09/16/2006  . Seizure ([place]) 09/16/2006  . Hyperlipidemia 09/09/2006  . CONSTIPATION, HX OF 09/09/2006    Past Surgical History:  Procedure Laterality Date  . implantable loop recorder  06/06/2019    Medtronic Reveal Linq model LNQ 11 SN [id number] S implantable loop recorder implanted in office by Dr Rayann Heman for cryptogenic stroke  . IR CT HEAD LTD  03/07/2019  . IR PERCUTANEOUS ART THROMBECTOMY/INFUSION INTRACRANIAL INC DIAG ANGIO  03/07/2019  . RADIOLOGY WITH ANESTHESIA N/A 03/07/2019   Procedure: IR WITH ANESTHESIA;  Surgeon:  Radiologist, Medication, MD;  Location: [facility];  Service: Radiology;  Laterality: N/A;  . THROMBECTOMY BRACHIAL ARTERY Left 12/15/2014   Procedure: THROMBECTOMY BRACHIAL ARTERY; VEIN PATCH ANGIOPLASTY LEFT BRACIAL ARTERY;  Surgeon: Angelia Mould, MD;  Location: [facility];  Service: Vascular;  Laterality: Left;  . TUBAL LIGATION       OB History   No obstetric history on file.     Family History  Problem Relation Age of Onset  . Hypertension Mother        died @ 42 - unknown cause.  [name] Hypertension Father        says she doesn't know anything about her father.  . Cancer Maternal Aunt   . Seizures Neg Hx     Social History   Tobacco Use  . Smoking status: Never Smoker  . Smokeless tobacco: Never Used  . Tobacco comment: EXPOSED TO 2ND HAND SMOKE X 20+ YEARS.   Vaping Use  . Vaping Use: Never used  Substance Use Topics  . Alcohol use: No  . Drug use: No    Home Medications Prior to Admission medications   Medication Sig Start Date End Date Taking? Authorizing Provider  ACCU-CHEK AVIVA PLUS test strip 3 (three) times daily. for testing 04/14/18   [provider]  Accu-Chek Softclix Lancets lancets See admin instructions. 12/29/17   [provider]  Ascorbic Acid (VITAMIN C PO) Take 1 tablet by mouth daily.    [provider]  aspirin 81 MG chewable tablet Chew 1 tablet (81 mg total) by mouth daily. 12/21/14   Rai, Vernelle Emerald, MD  B-D ULTRAFINE III SHORT PEN 31G X 8 MM MISC See admin instructions. 04/18/18   [provider]  bisacodyl (DULCOLAX) 5 MG EC tablet Take 2 tablets (10 mg total) by mouth daily as needed. 05/06/17   Aline August, MD  carbamazepine (TEGRETOL) 200 MG tablet Take 400 mg by mouth 2 (two) times daily.     [provider]  Cholecalciferol (VITAMIN D-3) 1000 UNITS CAPS Take 1,000 Units by mouth daily at 12 noon.     [provider]  citalopram (CELEXA) 20 MG tablet Take 20 mg by mouth daily.    [provider]  DUREZOL 0.05 % EMUL Place 1 drop into the right eye 3 (three) times daily. 02/22/19   [provider]  ezetimibe-simvastatin (VYTORIN) 10-40 MG per tablet Take 1 tablet by mouth daily.    [provider]  furosemide (LASIX) 80 MG tablet Take 0.5 tablets (40 mg total) by mouth daily. 05/06/17   Aline August, MD  glipiZIDE (GLUCOTROL XL) 5 MG 24 hr tablet Take 5 mg by mouth daily with breakfast.  03/24/18   [provider]  HUMULIN 70/30 KWIKPEN (70-30) 100 UNIT/ML PEN Inject 5 Units into the skin 2 (two) times daily. 05/06/17   Aline August, MD  mirtazapine (REMERON)  15 MG tablet Take 15 mg by mouth at bedtime.  05/06/18   [provider]  omeprazole (PRILOSEC) 40 MG capsule Take 40 mg by mouth daily.    [provider]  sodium bicarbonate 650 MG tablet Take 2 tablets (1,300 mg total) by mouth 3 (three) times daily. 03/14/19   Harold Hedge, MD    Allergies    Codeine and Metoprolol  Review of Systems   Review of Systems  Neurological: Positive for syncope (near syncope).  All other systems reviewed and are negative.   Physical Exam Updated Vital Signs BP (!) 130/100 (BP Location: Left Arm)   Temp 98.4 F (36.9 C) (Temporal)   Resp 20   SpO2 93%   Physical Exam Vitals and nursing note reviewed.  Constitutional:      Appearance: She is well-developed.     Comments: Obese, NAD  HENT:     Head: Normocephalic and atraumatic.  Eyes:     Conjunctiva/sclera: Conjunctivae normal.     Pupils: Pupils are equal, round, and reactive to light.  Cardiovascular:     Rate and Rhythm: Normal rate and regular rhythm.     Heart sounds: Normal heart sounds.  Pulmonary:     Effort: Pulmonary effort is normal. No respiratory distress.     Breath sounds: Normal breath sounds. No wheezing or rhonchi.     Comments: NAD, respirations unlabored, able to speak in full sentences without difficulty Abdominal:     General: Bowel sounds are  normal.     Palpations: Abdomen is soft.     Tenderness: There is no abdominal tenderness. There is no rebound.  Musculoskeletal:        General: Normal range of motion.     Cervical back: Normal range of motion.     Comments: Trace edema at the ankles  Skin:    General: Skin is warm and dry.  Neurological:     Mental Status: She is alert and oriented to person, place, and time.     Comments: AAOx3, answering questions and following commands appropriately; equal strength UE and LE bilaterally; CN grossly intact; moves all extremities appropriately without ataxia; no focal neuro deficits or facial asymmetry appreciated     ED Results / Procedures / Treatments   Labs (all labs ordered are listed, but only abnormal results are displayed) Labs Reviewed  CBC WITH DIFFERENTIAL/PLATELET - Abnormal; Notable for the following components:      Result Value   RBC 2.85 (*)    Hemoglobin 8.2 (*)    HCT 24.6 (*)    Lymphs Abs 0.4 (*)    All other components within normal limits  BASIC METABOLIC PANEL - Abnormal; Notable for the following components:   Sodium 124 (*)    Chloride 93 (*)    CO2 20 (*)    Glucose, Bld 356 (*)    BUN 53 (*)    Creatinine, Ser 2.97 (*)    Calcium 8.3 (*)    GFR calc non Af Amer 15 (*)    GFR calc Af Amer 17 (*)    All other components within normal limits  URINALYSIS, ROUTINE W REFLEX MICROSCOPIC - Abnormal; Notable for the following components:   Glucose, UA >=500 (*)    All other components within normal limits  BRAIN NATRIURETIC PEPTIDE - Abnormal; Notable for the following components:   B Natriuretic Peptide 796.1 (*)    All other components within normal limits  OSMOLALITY, URINE - Abnormal; Notable  for the following components:   Osmolality, Ur 270 (*)    All other components within normal limits  CBG MONITORING, ED - Abnormal; Notable for the following components:   Glucose-Capillary 234 (*)    All other components within normal limits  TROPONIN I  (HIGH SENSITIVITY) - Abnormal; Notable for the following components:   Troponin I (High Sensitivity) 61 (*)    All other components within normal limits  SARS CORONAVIRUS 2 BY RT PCR (HOSPITAL ORDER, [facility])  SODIUM, URINE, RANDOM  HEMOGLOBIN T0W  BASIC METABOLIC PANEL    EKG EKG Interpretation  Date/Time:  Saturday July 21 2019 23:18:56 EDT Ventricular Rate:  57 PR Interval:    QRS Duration: 92 QT Interval:  424 QTC Calculation: 413 R Axis:   27 Text Interpretation: Atrial fibrillation Confirmed by Virgel Manifold [phone]) on 07/21/2019 11:40:58 PM   Radiology DG Chest Port 1 View  Result Date: 07/21/2019 CLINICAL DATA:  Near syncope, dizziness EXAM: PORTABLE CHEST 1 VIEW COMPARISON:  03/07/2019 FINDINGS: Single frontal view of the chest demonstrates loop recorder overlying cardiac apex. The cardiac silhouette remains enlarged. No airspace disease, effusion, or pneumothorax. No acute bony abnormalities. IMPRESSION: 1. Stable enlarged cardiac silhouette. 2. No acute intrathoracic process. Electronically Signed   By: Randa Ngo M.D.   On: 07/21/2019 22:59    Procedures Procedures (including critical care time)  Medications Ordered in ED Medications  insulin aspart (novoLOG) injection 0-9 Units (has no administration in time range)  insulin aspart (novoLOG) injection 0-5 Units (2 Units Subcutaneous Given 07/22/19 0110)  insulin NPH Human (NOVOLIN N) injection 3 Units (has no administration in time range)  sodium chloride flush (NS) 0.9 % injection 3 mL (3 mLs Intravenous Not Given 07/22/19 0209)  enoxaparin (LOVENOX) injection 40 mg (has no administration in time range)  acetaminophen (TYLENOL) tablet 650 mg (has no administration in time range)    Or  acetaminophen (TYLENOL) suppository 650 mg (has no administration in time range)  ondansetron (ZOFRAN) tablet 4 mg (has no administration in time range)    Or  ondansetron (ZOFRAN) injection 4 mg  (has no administration in time range)  citalopram (CELEXA) tablet 20 mg (has no administration in time range)  ezetimibe-simvastatin (VYTORIN) 10-40 MG per tablet 1 tablet (has no administration in time range)  amLODipine (NORVASC) tablet 10 mg (has no administration in time range)  aspirin chewable tablet 81 mg (has no administration in time range)  carbamazepine (TEGRETOL) tablet 400 mg (400 mg Oral Given 07/22/19 0206)  mirtazapine (REMERON) tablet 15 mg (15 mg Oral Given 07/22/19 0206)  pantoprazole (PROTONIX) EC tablet 80 mg (has no administration in time range)  hydrALAZINE (APRESOLINE) tablet 25 mg (has no administration in time range)    ED Course  I have reviewed the triage vital signs and the nursing notes.  Pertinent labs & imaging results that were available during my care of the patient were reviewed by me and considered in my medical decision making (see chart for details).    MDM Rules/Calculators/A&P  74 year old female presenting to the ED after a near syncopal event.  Was at a birthday party with family today, was inside most of the day to the point.  She got ready to go home and was walking down a large ramp with her walker and when she got to the end got lightheaded.  She did not lose consciousness, strike her head, or suffer any injuries from this.  On arrival to  ED she is awake, alert, appropriately oriented.  No focal neurologic deficits.  States she feels back to baseline.  She has not had any chest pain, shortness of breath, palpitations.  EKG with somewhat irregular rhythm, AFIB vs bigeminy.  Repeat tracing without much difference.  Labs as above--she does have hyperglycemia with a hyponatremia.  She does admit to eating cake and other sweets today.  Her bicarb is essentially normal at 20 and a normal anion gap of 11.  I do not suspect DKA.  Chest x-ray is clear aside from enlarged cardiac silhouette.  Troponin is 61.  BNP is 796, patient does not appear significantly fluid  overloaded.  12:49 AM  Discussed with cardiology, Dr. Marletta Lor-- may have some AFIB with competing rhythm.  Similar EKG in Feb 2021 but in May was in true NSR when loop recorder inserted.  Would recommend admission for observation, will obtain info from Loop recorder and have team see her and provide recommendations in the AM.  Elevated trop may be some demand, less likely ACS.  Medical team to address hyponatremia.  Discussed with Dr. Alcario Drought-- he will admit for ongoing care.  Final Clinical Impression(s) / ED Diagnoses Final diagnoses:  Near syncope  Hyperglycemia  Hyponatremia    Rx / DC Orders ED Discharge Orders    None       Larene Pickett, PA-C 07/22/19 0320    Virgel Manifold, MD 07/23/19 [phone]

## 2019-07-21 NOTE — ED Triage Notes (Signed)
Pt BIB [facility], was at her daughter's bday party, getting back her car she had a near syncopal episode and dizziness. Family reports pt did not lose consciousness. Pt c/o dizziness on standing. Hx stroke in March 2021, no residual deficits.

## 2019-07-22 ENCOUNTER — Observation Stay (HOSPITAL_COMMUNITY): Payer: Medicare Other

## 2019-07-22 DIAGNOSIS — I5032 Chronic diastolic (congestive) heart failure: Secondary | ICD-10-CM | POA: Diagnosis not present

## 2019-07-22 DIAGNOSIS — I10 Essential (primary) hypertension: Secondary | ICD-10-CM

## 2019-07-22 DIAGNOSIS — D649 Anemia, unspecified: Secondary | ICD-10-CM | POA: Diagnosis not present

## 2019-07-22 DIAGNOSIS — E871 Hypo-osmolality and hyponatremia: Secondary | ICD-10-CM | POA: Diagnosis present

## 2019-07-22 DIAGNOSIS — N184 Chronic kidney disease, stage 4 (severe): Secondary | ICD-10-CM

## 2019-07-22 DIAGNOSIS — R55 Syncope and collapse: Secondary | ICD-10-CM | POA: Diagnosis not present

## 2019-07-22 DIAGNOSIS — R569 Unspecified convulsions: Secondary | ICD-10-CM

## 2019-07-22 DIAGNOSIS — Z794 Long term (current) use of insulin: Secondary | ICD-10-CM

## 2019-07-22 DIAGNOSIS — E119 Type 2 diabetes mellitus without complications: Secondary | ICD-10-CM

## 2019-07-22 LAB — BASIC METABOLIC PANEL
Anion gap: 11 (ref 5–15)
Anion gap: 10 (ref 5–15)
BUN: 53 mg/dL — ABNORMAL HIGH (ref 8–23)
BUN: 51 mg/dL — ABNORMAL HIGH (ref 8–23)
CO2: 20 mmol/L — ABNORMAL LOW (ref 22–32)
CO2: 23 mmol/L (ref 22–32)
Calcium: 8.3 mg/dL — ABNORMAL LOW (ref 8.9–10.3)
Calcium: 8.7 mg/dL — ABNORMAL LOW (ref 8.9–10.3)
Chloride: 93 mmol/L — ABNORMAL LOW (ref 98–111)
Chloride: 97 mmol/L — ABNORMAL LOW (ref 98–111)
Creatinine, Ser: 2.97 mg/dL — ABNORMAL HIGH (ref 0.44–1.00)
Creatinine, Ser: 2.83 mg/dL — ABNORMAL HIGH (ref 0.44–1.00)
GFR calc Af Amer: 17 mL/min — ABNORMAL LOW (ref >60)
GFR calc Af Amer: 19 mL/min — ABNORMAL LOW (ref >60)
GFR calc non Af Amer: 15 mL/min — ABNORMAL LOW (ref >60)
GFR calc non Af Amer: 16 mL/min — ABNORMAL LOW (ref >60)
Glucose, Bld: 356 mg/dL — ABNORMAL HIGH (ref 70–99)
Glucose, Bld: 100 mg/dL — ABNORMAL HIGH (ref 70–99)
Potassium: 4.2 mmol/L (ref 3.5–5.1)
Potassium: 4.5 mmol/L (ref 3.5–5.1)
Sodium: 124 mmol/L — ABNORMAL LOW (ref 135–145)
Sodium: 130 mmol/L — ABNORMAL LOW (ref 135–145)

## 2019-07-22 LAB — HEMOGLOBIN A1C
Hgb A1c MFr Bld: 5.3 % (ref 4.8–5.6)
Mean Plasma Glucose: 105.41 mg/dL

## 2019-07-22 LAB — GLUCOSE, CAPILLARY
Glucose-Capillary: 84 mg/dL (ref 70–99)
Glucose-Capillary: 148 mg/dL — ABNORMAL HIGH (ref 70–99)
Glucose-Capillary: 89 mg/dL (ref 70–99)

## 2019-07-22 LAB — CBG MONITORING, ED: Glucose-Capillary: 234 mg/dL — ABNORMAL HIGH (ref 70–99)

## 2019-07-22 LAB — SODIUM, URINE, RANDOM: Sodium, Ur: 46 mmol/L

## 2019-07-22 LAB — BRAIN NATRIURETIC PEPTIDE: B Natriuretic Peptide: 796.1 pg/mL — ABNORMAL HIGH (ref 0.0–100.0)

## 2019-07-22 LAB — TROPONIN I (HIGH SENSITIVITY): Troponin I (High Sensitivity): 61 ng/L — ABNORMAL HIGH (ref <18)

## 2019-07-22 LAB — OSMOLALITY, URINE: Osmolality, Ur: 270 mOsm/kg — ABNORMAL LOW (ref 300–900)

## 2019-07-22 LAB — SARS CORONAVIRUS 2 BY RT PCR (HOSPITAL ORDER, PERFORMED IN ~~LOC~~ HOSPITAL LAB): SARS Coronavirus 2: NEGATIVE

## 2019-07-22 MED ORDER — ONDANSETRON HCL 4 MG/2ML IJ SOLN: 4.0000 mg | Freq: Four times a day (QID) | INTRAMUSCULAR | Status: DC | PRN

## 2019-07-22 MED ORDER — PANTOPRAZOLE SODIUM 40 MG PO TBEC
80.0000 mg | DELAYED_RELEASE_TABLET | Freq: Every day | ORAL | Status: DC
  Administered 2019-07-22: 80 mg via ORAL
  Filled 2019-07-22: qty 2

## 2019-07-22 MED ORDER — MIRTAZAPINE 15 MG PO TABS
15.0000 mg | ORAL_TABLET | Freq: Every day | ORAL | Status: DC
  Administered 2019-07-22: 15 mg via ORAL
  Filled 2019-07-22: qty 1

## 2019-07-22 MED ORDER — CITALOPRAM HYDROBROMIDE 20 MG PO TABS
20.0000 mg | ORAL_TABLET | Freq: Every day | ORAL | Status: DC
  Administered 2019-07-22: 20 mg via ORAL
  Filled 2019-07-22: qty 1

## 2019-07-22 MED ORDER — ENOXAPARIN SODIUM 40 MG/0.4ML ~~LOC~~ SOLN: 40.0000 mg | SUBCUTANEOUS | Status: DC

## 2019-07-22 MED ORDER — CARBAMAZEPINE 200 MG PO TABS
400.0000 mg | ORAL_TABLET | Freq: Two times a day (BID) | ORAL | Status: DC
  Administered 2019-07-22 (×2): 400 mg via ORAL
  Filled 2019-07-22 (×3): qty 2

## 2019-07-22 MED ORDER — INSULIN NPH (HUMAN) (ISOPHANE) 100 UNIT/ML ~~LOC~~ SUSP
3.0000 [IU] | Freq: Two times a day (BID) | SUBCUTANEOUS | Status: DC
  Administered 2019-07-22: 3 [IU] via SUBCUTANEOUS
  Filled 2019-07-22: qty 10

## 2019-07-22 MED ORDER — ONDANSETRON HCL 4 MG PO TABS: 4.0000 mg | ORAL_TABLET | Freq: Four times a day (QID) | ORAL | Status: DC | PRN

## 2019-07-22 MED ORDER — INSULIN NPH (HUMAN) (ISOPHANE) 100 UNIT/ML ~~LOC~~ SUSP: 4.0000 [IU] | Freq: Two times a day (BID) | SUBCUTANEOUS | Status: DC

## 2019-07-22 MED ORDER — ACETAMINOPHEN 325 MG PO TABS: 650.0000 mg | ORAL_TABLET | Freq: Four times a day (QID) | ORAL | Status: DC | PRN

## 2019-07-22 MED ORDER — ACETAMINOPHEN 650 MG RE SUPP: 650.0000 mg | Freq: Four times a day (QID) | RECTAL | Status: DC | PRN

## 2019-07-22 MED ORDER — SODIUM CHLORIDE 0.9% FLUSH
3.0000 mL | Freq: Two times a day (BID) | INTRAVENOUS | Status: DC
  Administered 2019-07-22: 3 mL via INTRAVENOUS

## 2019-07-22 MED ORDER — INSULIN ASPART 100 UNIT/ML ~~LOC~~ SOLN
0.0000 [IU] | Freq: Every day | SUBCUTANEOUS | Status: DC
  Administered 2019-07-22: 2 [IU] via SUBCUTANEOUS

## 2019-07-22 MED ORDER — AMLODIPINE BESYLATE 10 MG PO TABS
10.0000 mg | ORAL_TABLET | Freq: Every day | ORAL | Status: DC
  Administered 2019-07-22: 10 mg via ORAL
  Filled 2019-07-22: qty 1

## 2019-07-22 MED ORDER — HYDRALAZINE HCL 25 MG PO TABS
25.0000 mg | ORAL_TABLET | Freq: Three times a day (TID) | ORAL | Status: DC
  Administered 2019-07-22: 25 mg via ORAL
  Filled 2019-07-22: qty 1

## 2019-07-22 MED ORDER — EZETIMIBE-SIMVASTATIN 10-40 MG PO TABS
1.0000 | ORAL_TABLET | Freq: Every day | ORAL | Status: DC
  Administered 2019-07-22: 1 via ORAL
  Filled 2019-07-22: qty 1

## 2019-07-22 MED ORDER — ASPIRIN 81 MG PO CHEW
81.0000 mg | CHEWABLE_TABLET | Freq: Every day | ORAL | Status: DC
  Administered 2019-07-22: 81 mg via ORAL
  Filled 2019-07-22: qty 1

## 2019-07-22 MED ORDER — INSULIN ASPART 100 UNIT/ML ~~LOC~~ SOLN
0.0000 [IU] | Freq: Three times a day (TID) | SUBCUTANEOUS | Status: DC
  Administered 2019-07-22: 1 [IU] via SUBCUTANEOUS

## 2019-07-22 NOTE — H&P (Signed)
History and Physical    [name] MGQ:[id number] DOB: Sep 09, 1945 DOA: 07/21/2019  PCP: Benito Mccreedy, MD  Patient coming from: Home  I have personally briefly reviewed patient's old medical records in [place]  Chief Complaint: Near syncope  HPI: [name] is a 74 y.o. female with medical history significant of anemia, CKD stage 4, DM2, HTN, bradycardia due to meds, stroke s/p loop recorder.  Pt presents to the ED at [facility] with c/o near syncope episode.  Patient states she has felt fine all day up until this evening.  Got up this morning as usual, went to birthday party for her daughter and was there for most of the day.  States they were outside but because of the heat she did stay indoors.  States they got ready to leave and she was walking down a long ramp with her walker when all of a sudden she got very lightheaded, dizzy, and woozy headed.  No LOC, no fall.   ED Course: Asymptomatic in ED.  Sodium 124 glucose 356, creat 2.97.  Corrected sodium 130.   BNP 796, trop 61.  Looks like she has chronically elevated troponins.  Had trops in the 400s and BNP 1112 back in Feb.  CXR neg  EKG is discussed in A/P below.  Review of Systems: As per HPI, otherwise all review of systems negative.  Past Medical History:  Diagnosis Date  . 1st degree AV block 05/02/2017  . Anemia   . Arterial embolism and thrombosis of upper extremity ([place])   . Chronic diastolic (congestive) heart failure ([place])    a. 04/2017 Echo: EF 60-65%. Gr1 DD. Mod LVH.   . CKD (chronic kidney disease) stage 4, GFR 15-29 ml/min (HCC) 05/02/2017  . Depression   . Diabetes mellitus type 2, insulin dependent ([place]) 05/02/2017  . DVT (deep venous thrombosis) ([place])   . Dyslipidemia 05/02/2017  . Essential hypertension   . GERD (gastroesophageal reflux disease)   . Hyperlipidemia   . Junctional bradycardia   . Morbid obesity with BMI of 45.0-49.9, adult ([place]) 05/02/2017  . OSA (obstructive sleep apnea)  10/17/2012  . Rhabdomyolysis 09/25/2012  . Seizures ([place])    "epilepsy; they think I had one a couple months ago" (12/11/2014)  . Symptomatic bradycardia 11/27/2016    Past Surgical History:  Procedure Laterality Date  . implantable loop recorder  06/06/2019    Medtronic Reveal Linq model LNQ 11 SN [id number] S implantable loop recorder implanted in office by Dr Rayann Heman for cryptogenic stroke  . IR CT HEAD LTD  03/07/2019  . IR PERCUTANEOUS ART THROMBECTOMY/INFUSION INTRACRANIAL INC DIAG ANGIO  03/07/2019  . RADIOLOGY WITH ANESTHESIA N/A 03/07/2019   Procedure: IR WITH ANESTHESIA;  Surgeon: Radiologist, Medication, MD;  Location: [place];  Service: Radiology;  Laterality: N/A;  . THROMBECTOMY BRACHIAL ARTERY Left 12/15/2014   Procedure: THROMBECTOMY BRACHIAL ARTERY; VEIN PATCH ANGIOPLASTY LEFT BRACIAL ARTERY;  Surgeon: Angelia Mould, MD;  Location: [place];  Service: Vascular;  Laterality: Left;  . TUBAL LIGATION       reports that she has never smoked. She has never used smokeless tobacco. She reports that she does not drink alcohol and does not use drugs.  Allergies  Allergen Reactions  . Codeine Nausea And Vomiting  . Metoprolol Other (See Comments)    Severe bradycardia.  Now getting admitted for 3rd time in 3 years for bradycardia onset after being started on toprol.  Please dont put patient on this med anymore!  Family History  Problem Relation Age of Onset  . Hypertension Mother        died @ 3 - unknown cause.  [name] Hypertension Father        says she doesn't know anything about her father.  . Cancer Maternal Aunt   . Seizures Neg Hx      Prior to Admission medications   Medication Sig Start Date End Date Taking? Authorizing Provider  amLODipine (NORVASC) 10 MG tablet Take 10 mg by mouth daily. 05/05/19  Yes [provider]  Ascorbic Acid (VITAMIN C PO) Take 1 tablet by mouth daily.   Yes [provider]  aspirin 81 MG chewable tablet Chew 1 tablet (81  mg total) by mouth daily. 12/21/14  Yes Rai, Ripudeep K, MD  carbamazepine (TEGRETOL) 200 MG tablet Take 400 mg by mouth 2 (two) times daily.    Yes [provider]  cetirizine (ZYRTEC) 10 MG tablet Take 10 mg by mouth daily. 07/19/19  Yes [provider]  citalopram (CELEXA) 20 MG tablet Take 20 mg by mouth daily.   Yes [provider]  ezetimibe-simvastatin (VYTORIN) 10-40 MG per tablet Take 1 tablet by mouth daily.   Yes [provider]  furosemide (LASIX) 80 MG tablet Take 0.5 tablets (40 mg total) by mouth daily. Patient taking differently: Take 80 mg by mouth daily.  05/06/17  Yes Aline August, MD  glipiZIDE (GLUCOTROL XL) 5 MG 24 hr tablet Take 5 mg by mouth daily with breakfast.  03/24/18  Yes [provider]  HUMULIN 70/30 KWIKPEN (70-30) 100 UNIT/ML PEN Inject 5 Units into the skin 2 (two) times daily. 05/06/17  Yes Aline August, MD  hydrALAZINE (APRESOLINE) 25 MG tablet Take 25 mg by mouth 3 (three) times daily. 05/13/19  Yes [provider]  mirtazapine (REMERON) 15 MG tablet Take 15 mg by mouth at bedtime.  05/06/18  Yes [provider]  omeprazole (PRILOSEC) 40 MG capsule Take 40 mg by mouth daily.   Yes [provider]  Vitamin D, Ergocalciferol, (DRISDOL) 1.25 MG (50000 UNIT) CAPS capsule Take 50,000 Units by mouth once a week. 07/09/19  Yes [provider]  ACCU-CHEK AVIVA PLUS test strip 3 (three) times daily. for testing 04/14/18   [provider]  Accu-Chek Softclix Lancets lancets See admin instructions. 12/29/17   [provider]  B-D ULTRAFINE III SHORT PEN 31G X 8 MM MISC See admin instructions. 04/18/18   [provider]    Physical Exam: Vitals:   07/22/19 0000 07/22/19 0001 07/22/19 0032 07/22/19 0100  BP: (!) 146/65  (!) 134/111   Pulse:  (!) 40 62   Resp:   20   Temp:   98.3 F (36.8 C)   TempSrc:   Oral   SpO2:  94% 95%   Weight:    106.5 kg  Height:    5'  2" (1.575 m)    Constitutional: NAD, calm, comfortable Eyes: PERRL, lids and conjunctivae normal ENMT: Mucous membranes are moist. Posterior pharynx clear of any exudate or lesions.Normal dentition.  Neck: normal, supple, no masses, no thyromegaly Respiratory: clear to auscultation bilaterally, no wheezing, no crackles. Normal respiratory effort. No accessory muscle use.  Cardiovascular: Regular rate and rhythm, no murmurs / rubs / gallops. No extremity edema. 2+ pedal pulses. No carotid bruits.  Abdomen: no tenderness, no masses palpated. No hepatosplenomegaly. Bowel sounds positive.  Musculoskeletal: no clubbing / cyanosis. No joint deformity upper and lower extremities. Good ROM,  no contractures. Normal muscle tone.  Skin: no rashes, lesions, ulcers. No induration Neurologic: CN 2-12 grossly intact. Sensation intact, DTR normal. Strength 5/5 in all 4.  Psychiatric: Normal judgment and insight. Alert and oriented x 3. Normal mood.    Labs on Admission: I have personally reviewed following labs and imaging studies  CBC: Recent Labs  Lab 07/21/19 2316  WBC 5.5  NEUTROABS 4.6  HGB 8.2*  HCT 24.6*  MCV 86.3  PLT 409   Basic Metabolic Panel: Recent Labs  Lab 07/21/19 2316  NA 124*  K 4.2  CL 93*  CO2 20*  GLUCOSE 356*  BUN 53*  CREATININE 2.97*  CALCIUM 8.3*   GFR: Estimated Creatinine Clearance: 19.7 mL/min (A) (by C-G formula based on SCr of 2.97 mg/dL (H)). Liver Function Tests: No results for input(s): AST, ALT, ALKPHOS, BILITOT, PROT, ALBUMIN in the last 168 hours. No results for input(s): LIPASE, AMYLASE in the last 168 hours. No results for input(s): AMMONIA in the last 168 hours. Coagulation Profile: No results for input(s): INR, PROTIME in the last 168 hours. Cardiac Enzymes: No results for input(s): CKTOTAL, CKMB, CKMBINDEX, TROPONINI in the last 168 hours. BNP (last 3 results) No results for input(s): PROBNP in the last 8760 hours. HbA1C: No results for  input(s): HGBA1C in the last 72 hours. CBG: Recent Labs  Lab 07/22/19 0108  GLUCAP 234*   Lipid Profile: No results for input(s): CHOL, HDL, LDLCALC, TRIG, CHOLHDL, LDLDIRECT in the last 72 hours. Thyroid Function Tests: No results for input(s): TSH, T4TOTAL, FREET4, T3FREE, THYROIDAB in the last 72 hours. Anemia Panel: No results for input(s): VITAMINB12, FOLATE, FERRITIN, TIBC, IRON, RETICCTPCT in the last 72 hours. Urine analysis:    Component Value Date/Time   COLORURINE YELLOW 07/21/2019 2346   [place] 07/21/2019 2346   LABSPEC 1.006 07/21/2019 2346   PHURINE 5.0 07/21/2019 2346   GLUCOSEU >=500 (A) 07/21/2019 2346   [place] NEGATIVE 07/21/2019 2346   [place] 07/21/2019 2346   [place] 07/21/2019 2346   PROTEINUR NEGATIVE 07/21/2019 2346   UROBILINOGEN 0.2 11/14/2014 1946   NITRITE NEGATIVE 07/21/2019 2346   LEUKOCYTESUR NEGATIVE 07/21/2019 2346    Radiological Exams on Admission: DG Chest Port 1 View  Result Date: 07/21/2019 CLINICAL DATA:  Near syncope, dizziness EXAM: PORTABLE CHEST 1 VIEW COMPARISON:  03/07/2019 FINDINGS: Single frontal view of the chest demonstrates loop recorder overlying cardiac apex. The cardiac silhouette remains enlarged. No airspace disease, effusion, or pneumothorax. No acute bony abnormalities. IMPRESSION: 1. Stable enlarged cardiac silhouette. 2. No acute intrathoracic process. Electronically Signed   By: Randa Ngo M.D.   On: 07/21/2019 22:59    EKG: Independently reviewed.  Assessment/Plan Principal Problem:   Near syncope Active Problems:   CKD (chronic kidney disease), stage IV (HCC)   Essential hypertension   Seizures disorder   Diabetes mellitus type 2, insulin dependent (HCC)   Anemia   Chronic diastolic CHF (congestive heart failure) (HCC)   Hyponatremia    1. Near syncope - 1. Hyponatremia is one possible cause 2. EKG with HR of ~60, irregular, question of A.Fib with a competing  rhythm?  Cardiology wasn't sure either.  Cardiologist on call wants to discuss with AM team, they will likely interrogate her loop recorder and see her in AM. 1. Asymptomatic at the moment 2. Going to hold off on any specific treatment or starting blood thinners until cardiology sees patient and decides what exactly rhythm even is. 3. Syncope pathway 4.  Will defer need for echo to cardiology - pt had echo earlier this year. 5. Tele monitor 2. Hyponatremia - 1. Possibly due to lasix / dehydration? 2. Holding lasix to "gently hydrate" patient 3. Repeat BMP in AM 4. Checking urine sodium and osm 3. HTN - 1. Cont home BP meds 4. Seizure disorder - 1. Cont tegretol 5. DM2 - 1. Pt only taking glipizide and 70/30 5u BID 2. NPH 3u BID 3. Sensitive SSI AC/HS 6. Chronic diastolic CHF - 1. Holding lasix as noted above 7. Anemia - 1. Chronic and baseline 2. Presumably related to CKD  DVT prophylaxis: Lovenox Code Status: Full Family Communication: No family in room Disposition Plan: Home after syncope work up [employer] called: Cardiology will see pt in AM Admission status: Place in [place], [place] Hospitalists  How to contact the [facility] Attending or Consulting provider [place] or covering provider during after hours [place], for this patient?  1. Check the care team in [facility] and look for a) attending/consulting [facility] provider listed and b) the [facility] team listed 2. Log into [email or web address]  Amion Physician Scheduling and messaging for groups and whole hospitals  On call and physician scheduling software for group practices, residents, hospitalists and other medical providers for call, clinic, rotation and shift schedules. On[employer] is a hospital-wide system for scheduling doctors and paging doctors on call. EasyPlot is for scientific plotting and data analysis.  [email or web address]  and use [place]'s universal password to access. If you do not have the password, please  contact the hospital operator.  3. Locate the [facility] provider you are looking for under [facility] and page to a number that you can be directly reached. 4. If you still have difficulty reaching the provider, please page the [facility] (Director on Call) for the Hospitalists listed on amion for assistance.  07/22/2019, 1:24 AM

## 2019-07-22 NOTE — Discharge Instructions (Addendum)
Follow with Benito Mccreedy, MD in 3-5 days  Hold Lasix at the time of discharge as there was evidence that you may have been dehydrated when you came to the hospital, please see your PCP in 3-5 days for repeat blood work, discuss with your PCP whether you should resume Lasix at that time  DIET  Low sodium Heart Healthy Diet with fluid restriction 1500cc/day  ACTIVITY  Avoid strenuous activity  WEIGH DAILY AT SAME TIME . CALL YOUR DOCTOR IF WEIGHT INCREASES BY MORE THAN 3  POUNDS IN 2 DAYS  CALL YOUR DOCTOR OR COME TO EMERGENCY ROOM IF WORSENING SHORTNESS OF BREATH OR  CHEST PAIN OR SWELLING   If you smoke cigarettes or use any tobacco products you are advised to stop. Please ask nurse for any written materials  Or additional information you want regarding smoking cessation   Please get a complete blood count and chemistry panel checked by your Primary MD at your next visit, and again as instructed by your Primary MD. Please get your medications reviewed and adjusted by your Primary MD.  Please request your Primary MD to go over all Hospital Tests and Procedure/Radiological results at the follow up, please get all Hospital records sent to your Prim MD by signing hospital release before you go home.  In some cases, there will be blood work, cultures and biopsy results pending at the time of your discharge. Please request that your primary care M.D. goes through all the records of your hospital data and follows up on these results.  If you had Pneumonia of Lung problems at the Hospital: Please get a 2 view Chest X ray done in 6-8 weeks after hospital discharge or sooner if instructed by your Primary MD.  If you have Congestive Heart Failure: Please call your Cardiologist or Primary MD anytime you have any of the following symptoms:  1) 3 pound weight gain in 24 hours or 5 pounds in 1 week  2) shortness of breath, with or without a dry hacking cough  3) swelling in the hands, feet or  stomach  4) if you have to sleep on extra pillows at night in order to breathe  Follow cardiac low salt diet and 1.5 lit/day fluid restriction.  If you have diabetes Accuchecks 4 times/day, Once in AM empty stomach and then before each meal. Log in all results and show them to your primary doctor at your next visit. If any glucose reading is under 80 or above 300 call your primary MD immediately.  If you have Seizure/Convulsions/Epilepsy: Please do not drive, operate heavy machinery, participate in activities at heights or participate in high speed sports until you have seen by Primary MD or a Neurologist and advised to do so again. Per [facility] statutes, patients with seizures are not allowed to drive until they have been seizure-free for six months.  Use caution when using heavy equipment or power tools. Avoid working on ladders or at heights. Take showers instead of baths. Ensure the water temperature is not too high on the home water heater. Do not go swimming alone. Do not lock yourself in a room alone (i.e. bathroom). When caring for infants or small children, sit down when holding, feeding, or changing them to minimize risk of injury to the child in the event you have a seizure. Maintain good sleep hygiene. Avoid alcohol.   If you had Gastrointestinal Bleeding: Please ask your Primary MD to check a complete blood count within one week  of discharge or at your next visit. Your endoscopic/colonoscopic biopsies that are pending at the time of discharge, will also need to followed by your Primary MD.  Get Medicines reviewed and adjusted. Please take all your medications with you for your next visit with your Primary MD  Please request your Primary MD to go over all hospital tests and procedure/radiological results at the follow up, please ask your Primary MD to get all Hospital records sent to his/her office.  If you experience worsening of your admission symptoms, develop  shortness of breath, life threatening emergency, suicidal or homicidal thoughts you must seek medical attention immediately by calling 911 or calling your MD immediately  if symptoms less severe.  You must read complete instructions/literature along with all the possible adverse reactions/side effects for all the Medicines you take and that have been prescribed to you. Take any new Medicines after you have completely understood and accpet all the possible adverse reactions/side effects.   Do not drive or operate heavy machinery when taking Pain medications.   Do not take more than prescribed Pain, Sleep and Anxiety Medications  Special Instructions: If you have smoked or chewed Tobacco  in the last 2 yrs please stop smoking, stop any regular Alcohol  and or any Recreational drug use.  Wear Seat belts while driving.  Please note You were cared for by a hospitalist during your hospital stay. If you have any questions about your discharge medications or the care you received while you were in the hospital after you are discharged, you can call the unit and asked to speak with the hospitalist on call if the hospitalist that took care of you is not available. Once you are discharged, your primary care physician will handle any further medical issues. Please note that NO REFILLS for any discharge medications will be authorized once you are discharged, as it is imperative that you return to your primary care physician (or establish a relationship with a primary care physician if you do not have one) for your aftercare needs so that they can reassess your need for medications and monitor your lab values.  You can reach the hospitalist office at phone [phone] or fax [phone]   If you do not have a primary care physician, you can call [phone] for a physician referral.  Activity: As tolerated with Full fall precautions use walker/cane & assistance as needed    Diet: low sodium,  diabetic  Disposition Home

## 2019-07-22 NOTE — Consult Note (Addendum)
Cardiology Consultation:   Patient ID: [name] MRN: [id number]; DOB: 1945/11/06  Admit date: 07/21/2019 Date of Consult: 07/22/2019  Primary Care Provider: Benito Mccreedy, MD [facility] Cardiologist: Lauree Chandler, MD (seen once when admitted 11/2014) [place] Electrophysiologist:  Thompson Grayer, MD   Patient Profile:   [name] is a 74 y.o. female with a hx of hypertensive heart disease, chronic diastolic heart failure, chronic kidney disease stage IV, intermittent junctional bradycardia, hyperlipidemia, diabetes mellitus, cryptogenic stroke with SDH 02/2019 of unknown etiology s/p implantable loop recorder 05/2019 placement, seizure disorder and morbid obesity who is being seen today for the evaluation of near syncope and bradycardia at the request of Dr. Alcario Drought.   Seen by Dr. Angelena Form once November 2016 for junctional bradycardia in setting of worsening renal failure. Her clonidine and beta-blocker was discontinued.  During that admission she had developed left arm numbness and underwent left brachial embolectomy 12/15/2014. Treated with coumadin.   She was again seen for recurrent junctional bradycardia 06/2015.  She was admitted for acute CHF exacerbation February 2021. Hospital course complicated by acute left ischemic stroke secondary to M1 occlusion. She underwent successful mechanical thrombectomy on 03/07/19.  She also had right sided SDH (On ASA 81mg  qd) . She was in sinus rhythm with intermittent junctional bradycardia.   Seen by Dr. Leonie Man 05/02/2019>> " Check follow-up CT scan of the head to look for resolution of subdural hematoma". Still pending.   Seen by Dr. Rayann Heman in clinic 06/06/2019 and underwent ILR placement. No arrhythmias on last device check 07/09/19.   History of Present Illness:   Ms. Giammarco admitted with near syncope episode.  She was doing well up until last evening.  She was at her daughter's house for her birthday.  Around 9 PM she  was going downhill on ramp with walker and suddenly lost balance/near syncope and went down so fast at the end of ramp and went into durt. She fell down and could not stood up.  No loss of consciousness.  She felt lightheadedness, nauseated and short of breath.  She could not stand up and EMS was called and brought to ER for further evaluation.  She denies chest pain, palpitation, orthopnea, PND, syncope/loss of consciousness or lower extremity edema.  She lives by herself and uses walker for ambulation.  Blood sugar was running high on arrival to ER at 356.  BNP 796.  High-sensitivity troponin 61.  Hemoglobin 8.2.  Creatinine 2.97>> 2.83.  Sodium 124>> 130.  Hemoglobin A1c 5.3.  Covid negative.  Urine analysis without evidence of UTI.   Past Medical History:  Diagnosis Date  . 1st degree AV block 05/02/2017  . Anemia   . Arterial embolism and thrombosis of upper extremity ([place])   . Chronic diastolic (congestive) heart failure ([place])    a. 04/2017 Echo: EF 60-65%. Gr1 DD. Mod LVH.   . CKD (chronic kidney disease) stage 4, GFR 15-29 ml/min (HCC) 05/02/2017  . Depression   . Diabetes mellitus type 2, insulin dependent ([place]) 05/02/2017  . DVT (deep venous thrombosis) ([place])   . Dyslipidemia 05/02/2017  . Essential hypertension   . GERD (gastroesophageal reflux disease)   . Hyperlipidemia   . Junctional bradycardia   . Morbid obesity with BMI of 45.0-49.9, adult ([place]) 05/02/2017  . OSA (obstructive sleep apnea) 10/17/2012  . Rhabdomyolysis 09/25/2012  . Seizures ([place])    "epilepsy; they think I had one a couple months ago" (12/11/2014)  . Symptomatic bradycardia 11/27/2016  Past Surgical History:  Procedure Laterality Date  . implantable loop recorder  06/06/2019    Medtronic Reveal Linq model LNQ 11 SN [id number] S implantable loop recorder implanted in office by Dr Rayann Heman for cryptogenic stroke  . IR CT HEAD LTD  03/07/2019  . IR PERCUTANEOUS ART THROMBECTOMY/INFUSION INTRACRANIAL INC DIAG ANGIO   03/07/2019  . RADIOLOGY WITH ANESTHESIA N/A 03/07/2019   Procedure: IR WITH ANESTHESIA;  Surgeon: Radiologist, Medication, MD;  Location: [facility];  Service: Radiology;  Laterality: N/A;  . THROMBECTOMY BRACHIAL ARTERY Left 12/15/2014   Procedure: THROMBECTOMY BRACHIAL ARTERY; VEIN PATCH ANGIOPLASTY LEFT BRACIAL ARTERY;  Surgeon: Angelia Mould, MD;  Location: [place];  Service: Vascular;  Laterality: Left;  . TUBAL LIGATION      Inpatient Medications: Scheduled Meds: . amLODipine  10 mg Oral Daily  . aspirin  81 mg Oral Daily  . carbamazepine  400 mg Oral BID  . citalopram  20 mg Oral Daily  . enoxaparin (LOVENOX) injection  40 mg Subcutaneous Q24H  . ezetimibe-simvastatin  1 tablet Oral Daily  . hydrALAZINE  25 mg Oral TID  . insulin aspart  0-5 Units Subcutaneous QHS  . insulin aspart  0-9 Units Subcutaneous TID WC  . insulin NPH Human  3 Units Subcutaneous BID AC & HS  . mirtazapine  15 mg Oral QHS  . pantoprazole  80 mg Oral Daily  . sodium chloride flush  3 mL Intravenous Q12H   Continuous Infusions:  PRN Meds: acetaminophen **OR** acetaminophen, ondansetron **OR** ondansetron (ZOFRAN) IV  Allergies:    Allergies  Allergen Reactions  . Codeine Nausea And Vomiting  . Metoprolol Other (See Comments)    Severe bradycardia.  Now getting admitted for 3rd time in 3 years for bradycardia onset after being started on toprol.  Please dont put patient on this med anymore!    Social History:   Social History   Socioeconomic History  . Marital status: Divorced    Spouse name: Not on file  . Number of children: 2  . Years of education: 10  . Highest education level: Not on file  Occupational History  . Occupation: Retired  Tobacco Use  . Smoking status: Never Smoker  . Smokeless tobacco: Never Used  . Tobacco comment: EXPOSED TO 2ND HAND SMOKE X 20+ YEARS.   Vaping Use  . Vaping Use: Never used  Substance and Sexual Activity  . Alcohol use: No  . Drug use: No  .  Sexual activity: Never  Other Topics Concern  . Not on file  Social History Narrative   Lives in [place] by herself.  Daughters nearby.  Sedentary.   Eats fast food for at least one meal almost daily.   Caffeine use:  Drinks soda daily.   Coffee rarely.    Social Determinants of Health   Financial Resource Strain:   . Difficulty of Paying Living Expenses:   Food Insecurity:   . Worried About Charity fundraiser in the Last Year:   . Arboriculturist in the Last Year:   Transportation Needs:   . Film/video editor (Medical):   [name] Lack of Transportation (Non-Medical):   Physical Activity:   . Days of Exercise per Week:   . Minutes of Exercise per Session:   Stress:   . Feeling of Stress :   Social Connections:   . Frequency of Communication with Friends and Family:   . Frequency of Social Gatherings with Friends and Family:   .  Attends Religious Services:   . Active Member of Clubs or Organizations:   . Attends Archivist Meetings:   [name] Marital Status:   Intimate Partner Violence:   . Fear of Current or Ex-Partner:   . Emotionally Abused:   [name] Physically Abused:   . Sexually Abused:     Family History:   Family History  Problem Relation Age of Onset  . Hypertension Mother        died @ 40 - unknown cause.  [name] Hypertension Father        says she doesn't know anything about her father.  . Cancer Maternal Aunt   . Seizures Neg Hx      ROS:  Please see the history of present illness.  All other ROS reviewed and negative.     Physical Exam/Data:   Vitals:   07/22/19 0200 07/22/19 0220 07/22/19 0330 07/22/19 0724  BP: 136/73 (!) 156/96 (!) 169/76 (!) 149/61  Pulse:  60 (!) 58 (!) 153  Resp:  20 20 20   Temp:  98.3 F (36.8 C) 98.5 F (36.9 C) 98.5 F (36.9 C)  TempSrc:  Oral Oral Oral  SpO2:  95% 96% 94%  Weight:      Height:        Intake/Output Summary (Last 24 hours) at 07/22/2019 0838 Last data filed at 07/22/2019 0724 Gross per 24 hour  Intake --   Output 200 ml  Net -200 ml   Last 3 Weights 07/22/2019 06/06/2019 05/02/2019  Weight (lbs) 234 lb 12.6 oz 234 lb 12.8 oz 239 lb  Weight (kg) 106.5 kg 106.505 kg 108.41 kg     Body mass index is 42.94 kg/m.  General:  Well nourished, well developed, in no acute distress HEENT: normal Lymph: no adenopathy Neck: no JVD Endocrine:  No thryomegaly Vascular: No carotid bruits; FA pulses 2+ bilaterally without bruits  Cardiac:  normal S1, S2; RRR; no murmur  Lungs:  clear to auscultation bilaterally, no wheezing, rhonchi or rales  Abd: soft, nontender, no hepatomegaly  Ext: no edema Musculoskeletal:  No deformities, BUE and BLE strength normal and equal Skin: warm and dry  Neuro:  CNs 2-12 intact, no focal abnormalities noted Psych:  Normal affect   EKG:  The EKG was personally reviewed and demonstrates: Junctional rhythm at rate of 57 bpm, frequent PAC (EKG read as atrial fibrillation) Telemetry:  Telemetry was personally reviewed and demonstrates: Sinus rhythm/bradycardia up to heart rate of 30s, intermittent junctional bradycardia.  No clear evidence of atrial fibrillation  Relevant CV Studies:  Echo 03/07/19 1. Left ventricular ejection fraction, by estimation, is 65 to 70%. The  left ventricle has normal function. The left ventrical has no regional  wall motion abnormalities. Left ventricular diastolic function could not  be evaluated. LV filling pressures  not assessed. No evidence of LV apical thrombus by definity contrast.  2. Right ventricular systolic function was not well visualized. The right  ventricular size is not well visualized.  3. The mitral valve is normal in structure and function.  4. The aortic valve was not assessed.  Echo 03/05/2019 1. Left ventricular ejection fraction, by estimation, is 60 to 65%. The  left ventricle has normal function. Left ventricular diastolic parameters  are consistent with Grade I diastolic dysfunction (impaired relaxation).   Normal wall motion. Mild LV  hypertrophy.  2. Right ventricular systolic function is moderately reduced. The right  ventricular size is mildly enlarged. Tricuspid regurgitation signal is  inadequate for  assessing RVSP. Would consider PE.  3. The aortic valve is tricuspid. No stenosis or regurgitation.  4. No mitral regurgitation.  5. The IVC was not visualized.    Laboratory Data:  High Sensitivity Troponin:   Recent Labs  Lab 07/21/19 2316  TROPONINIHS 61*     Chemistry Recent Labs  Lab 07/21/19 2316 07/22/19 0519  NA 124* 130*  K 4.2 4.5  CL 93* 97*  CO2 20* 23  GLUCOSE 356* 100*  BUN 53* 51*  CREATININE 2.97* 2.83*  CALCIUM 8.3* 8.7*  GFRNONAA 15* 16*  GFRAA 17* 19*  ANIONGAP 11 10    Hematology Recent Labs  Lab 07/21/19 2316  WBC 5.5  RBC 2.85*  HGB 8.2*  HCT 24.6*  MCV 86.3  MCH 28.8  MCHC 33.3  RDW 13.8  PLT 235   BNP Recent Labs  Lab 07/21/19 2316  BNP 796.1*     Radiology/Studies:  DG Chest Port 1 View  Result Date: 07/21/2019 CLINICAL DATA:  Near syncope, dizziness EXAM: PORTABLE CHEST 1 VIEW COMPARISON:  03/07/2019 FINDINGS: Single frontal view of the chest demonstrates loop recorder overlying cardiac apex. The cardiac silhouette remains enlarged. No airspace disease, effusion, or pneumothorax. No acute bony abnormalities. IMPRESSION: 1. Stable enlarged cardiac silhouette. 2. No acute intrathoracic process. Electronically Signed   By: Randa Ngo M.D.   On: 07/21/2019 22:59    Assessment and Plan:   1. Near syncope No loss of consciousness.  Patient thinks she may have lost her balance and went down fast on ramp.  However she was weak, nauseated and dizzy afterwards.  Witnessed by family.  Given history of intermittent junctional bradycardia and cryptogenic stroke>> will interrogate device. - LVEF of 65-70% by last echo 02/2019>>no need to repeat echo  2.  Intermittent junctional bradycardia -Longstanding history dating back to  2016 -No clear evidence of atrial fibrillation on telemetry. Seems underlying rhythm is sinus with escape junctional beat -Pending loop recorder interrogation -Avoid AV blocking agent  3.  History of cryptogenic stroke with subdural hematoma -Has implantable loop recorder - Pending CT of head to look for resolution of SDH (recoemnded by Dr. Leonie Man 05/02/2019).  - No neurological deficit  4.  Hypertensive heart disease with chronic diastolic heart failure -Blood pressure relatively stable -No evidence of volume overload  5.  Chronic kidney disease stage IV -Stable renal function  6.  Hyponatremia -Improving   For questions or updates, please contact [place] Please consult [email or web address] for contact info under    Jarrett Soho, PA  07/22/2019 8:38 AM   I have seen and examined the patient along with Leanor Kail, PA .  I have reviewed the chart, notes and new data.  I agree with PA/NP's note.  Key new complaints: feels well this morning. She is confident she lost her balance on the ramp and did not have syncope or even near-syncope. Her glucose control is usually better, but "ate too much" at her daughter's birthday party. Key examination changes: obese, normal CV exam Key new findings / data: ECG shows severe sinus bradycardia and a junctional escape rhythm. Rhythm is irregular due to occasional captured sinus P waves. Similar findings on loop recorder interrogation: 2 episodes of 2-minute "Afib" are irregular rhythms due to occasional capture P waves. No true AF is see. No severe bradycardia or pauses have been recorded. No arrhythmia was seen at the time of her fall. Hyponatremia partially corrected with reduction in glucose level (remains mildly hyponatremic at  130). Renal function is at baseline, c/w CKD 4 (GFR 15-20). Moderate normocytic anemia of CKD. CT head pending. Excellent A1c (maybe "too good") and lipid profile.  PLAN: No evidence of atrial  fibrillation or significant bradycardia. She has sinus node dysfunction, but has a reasonable rate due to junctional rhythm. A pacemaker implantation may be necessary in the future, but this is not imminent. Will discuss w Dr. Rayann Heman, but suspect will continue with the current treatment plan.  [facility] will sign off.   Medication Recommendations:  No change in cardiac meds, consider reduction in insulin dose or sulfonylurea dose Other recommendations (labs, testing, etc):  Continue monthly loop recorder downloads (nexty scheduled 07/19), monitor closely for hypoglycemia Follow up as an outpatient:  As previously scheduled   Sanda Klein, MD, [place] [phone] 07/22/2019, 8:56 AM

## 2019-07-22 NOTE — TOC Transition Note (Signed)
Transition of Care [facility]) - CM/SW Discharge Note   Patient Details  Name: [name] MRN: [id number] Date of Birth: [birth year]  Transition of Care [facility]) CM/SW Contact:  Carles Collet, RN Phone Number: 07/22/2019, 2:36 PM   Clinical Narrative:    Spoke w patient, she is agreeable to [facility] services.  Referral placed to [facility]. SHe states her daughter will provide transport home. No other CM needs identified.     Final next level of care: [place] Barriers to Discharge: No Barriers Identified   Patient Goals and CMS Choice Patient states their goals for this hospitalization and ongoing recovery are:: to go home   Choice offered to / list presented to : Patient  Discharge Placement                       Discharge Plan and Services                          HH Arranged: PT [facility] Agency: [place] ([facility]) Date [place]: 07/22/19 Time [place]: [place] Representative spoke with at [place]: [place] ([place]) Interventions     Readmission Risk Interventions No flowsheet data found.

## 2019-07-22 NOTE — ED Notes (Signed)
[name], sister, [phone] would like an update when available

## 2019-07-22 NOTE — Discharge Summary (Addendum)
Physician Discharge Summary  [name] JYN:[id number] DOB: Apr 25, 1945 DOA: 07/21/2019  PCP: Benito Mccreedy, MD  Admit date: 07/21/2019 Discharge date: 07/22/2019  Admitted From: home Disposition:  home  Recommendations for Outpatient Follow-up:  1. Follow up with PCP in 3-5 days for repeat blood work. 2. Due to dehydration on admission Lasix has been held at the time of discharge, at follow-up please obtain labs and consider resuming diuretics 3. Follow-up with Dr. Rayann Heman in 1 to 2 weeks  Home Health: None Equipment/Devices: None  Discharge Condition: Stable CODE STATUS: Full code Diet recommendation: Low-sodium, heart healthy, diabetic diet  HPI: Per admitting MD, [name] is a 74 y.o. female with medical history significant of anemia, CKD stage 4, DM2, HTN, bradycardia due to meds, stroke s/p loop recorder. Pt presents to the ED at [facility] with c/o near syncope episode.  Patient states she has felt fine all day up until this evening. Got up this morning as usual, went to birthday party for her daughter and was there for most of the day. States they were outside but because of the heat she did stay indoors. States they got ready to leave and she was walking down a long ramp with her walker when all of a sudden she got very lightheaded, dizzy, and woozy headed.No LOC, no fall.  Hospital Course / Discharge diagnoses: Near syncope-patient actually reported that she lost the balance and did not have a syncope on even near syncope, there is no loss of consciousness.  Given abnormal heart rhythm on admission with severe sinus bradycardia and junctional escape rhythm cardiology was consulted and evaluated patient in the hospital.  They did not recommend a 2D echo given the fact that she just had 1 with her most recent CVA few months ago.  She is normotensive, at baseline, has no chest pain, lightheadedness, dizziness, cardiology signed off and will be discharged home in stable  condition.  She has a loop recorder in place following her prior CVA which was interrogated by cardiology and there were no reported acute events Hyponatremia-sodium 124 however adjusted for glucose was 128.  She was hypochloremic and felt to be slightly dehydrated and her Lasix has been held.  Her sodium improved to 130.  I have discussed with cardiology, clinically she looked on the dry side and will be discharged home off of her Lasix.  Patient was instructed to follow-up with PCP within 3 to 5 days for repeat blood work as well as assess for edema/fluid and time resumption of the Lasix. Essential hypertension-continue home medications except for Lasix as above Seizure disorder-continue home medications Type 2 diabetes mellitus-continue home medications Chronic diastolic CHF-holding Lexis as above Anemia of chronic kidney disease-stable at baseline Chronic kidney disease stage V-Baseline creatinine around 3.1-3.5, currently better than baseline.  Outpatient follow-up. Prior CVA-continue home medications, outpatient follow-up with neurology.  CT scan during this admission without acute findings.  Discharge Instructions  Allergies as of 07/22/2019      Reactions   Codeine Nausea And Vomiting   Metoprolol Other (See Comments)   Severe bradycardia.  Now getting admitted for 3rd time in 3 years for bradycardia onset after being started on toprol.  Please dont put patient on this med anymore!      Medication List    STOP taking these medications   furosemide 80 MG tablet Commonly known as: LASIX     TAKE these medications   Accu-Chek Aviva Plus test strip Generic drug: glucose blood  3 (three) times daily. for testing   Accu-Chek Softclix Lancets lancets See admin instructions.   amLODipine 10 MG tablet Commonly known as: NORVASC Take 10 mg by mouth daily.   aspirin 81 MG chewable tablet Chew 1 tablet (81 mg total) by mouth daily.   B-D ULTRAFINE III SHORT PEN 31G X 8 MM  Misc Generic drug: Insulin Pen Needle See admin instructions.   carbamazepine 200 MG tablet Commonly known as: TEGRETOL Take 400 mg by mouth 2 (two) times daily.   cetirizine 10 MG tablet Commonly known as: ZYRTEC Take 10 mg by mouth daily.   citalopram 20 MG tablet Commonly known as: CELEXA Take 20 mg by mouth daily.   ezetimibe-simvastatin 10-40 MG tablet Commonly known as: VYTORIN Take 1 tablet by mouth daily.   glipiZIDE 5 MG 24 hr tablet Commonly known as: GLUCOTROL XL Take 5 mg by mouth daily with breakfast.   HumuLIN 70/30 KwikPen (70-30) 100 UNIT/ML KwikPen Generic drug: insulin isophane & regular human Inject 5 Units into the skin 2 (two) times daily.   hydrALAZINE 25 MG tablet Commonly known as: APRESOLINE Take 25 mg by mouth 3 (three) times daily.   mirtazapine 15 MG tablet Commonly known as: REMERON Take 15 mg by mouth at bedtime.   omeprazole 40 MG capsule Commonly known as: PRILOSEC Take 40 mg by mouth daily.   VITAMIN C PO Take 1 tablet by mouth daily.   Vitamin D (Ergocalciferol) 1.25 MG (50000 UNIT) Caps capsule Commonly known as: DRISDOL Take 50,000 Units by mouth once a week.       Follow-up Information    Allred, Jeneen Rinks, MD. Schedule an appointment as soon as possible for a visit in 2 week(s).   Specialty: Cardiology Contact information: [place] [address] [place] [address] [phone]        Benito Mccreedy, MD. Schedule an appointment as soon as possible for a visit in 3 day(s).   Specialty: Internal Medicine Why: to repeat labs Contact information: [address] [place] [address] [phone]        Burnell Blanks, MD .   Specialty: Cardiology Contact information: [address] [place] [address] [phone]               Consultations:  Cardiology   Procedures/Studies:  CT HEAD WO CONTRAST  Result Date: 07/22/2019 CLINICAL DATA:  Syncope.  EXAM: CT HEAD WITHOUT CONTRAST TECHNIQUE: Contiguous axial images were obtained from the base of the skull through the vertex without intravenous contrast. COMPARISON:  CT head 03/07/2019.  MRI head 03/08/2019 FINDINGS: Brain: Small low-density extra-axial fluid collection on the right compatible with chronic subdural hematoma. Acute subdural hematoma on the right was seen on the prior CT. Negative for acute hemorrhage. Generalized atrophy with chronic microvascular ischemic changes in the white matter. Hypodensity in the right occipital lobe is new but appears to represent a nonacute infarct. No midline shift. Vascular: Negative for hyperdense vessel. Skull: No focal skeletal lesion. Sinuses/Orbits: Paranasal sinuses clear.  Negative orbit Other: None IMPRESSION: Small chronic subdural hematoma on the right. There was acute subdural blood on the right on the prior CT of 03/07/2019. No acute hemorrhage Chronic infarct in the right occipital lobe which is not identified on the prior MRI. Electronically Signed   By: Franchot Gallo M.D.   On: 07/22/2019 12:04   DG Chest Port 1 View  Result Date: 07/21/2019 CLINICAL DATA:  Near syncope, dizziness EXAM:  PORTABLE CHEST 1 VIEW COMPARISON:  03/07/2019 FINDINGS: Single frontal view of the chest demonstrates loop recorder overlying cardiac apex. The cardiac silhouette remains enlarged. No airspace disease, effusion, or pneumothorax. No acute bony abnormalities. IMPRESSION: 1. Stable enlarged cardiac silhouette. 2. No acute intrathoracic process. Electronically Signed   By: Randa Ngo M.D.   On: 07/21/2019 22:59   CUP PACEART REMOTE DEVICE CHECK  Result Date: 07/09/2019 [facility] summary report received. Battery status OK. Normal device function. No new symptom episodes, tachy episodes, brady, or pause episodes. No new AF episodes. Monthly summary reports and ROV/PRN [name] Breach, RN, CCDS, [employer]     Subjective: - no chest pain, shortness of  breath, no abdominal pain, nausea or vomiting.   Discharge Exam: BP (!) 149/61 (BP Location: Left Arm)   Pulse 89   Temp 98.5 F (36.9 C) (Oral)   Resp 20   Ht 5\' 2"  (1.575 m)   Wt 106.5 kg   SpO2 94%   BMI 42.94 kg/m   General: Pt is alert, awake, not in acute distress Cardiovascular: RRR, S1/S2 +, no rubs, no gallops Respiratory: CTA bilaterally, no wheezing, no rhonchi Abdominal: Soft, NT, ND, bowel sounds + Extremities: no edema, no cyanosis   The results of significant diagnostics from this hospitalization (including imaging, microbiology, ancillary and laboratory) are listed below for reference.     Microbiology: Recent Results (from the past 240 hour(s))  SARS Coronavirus 2 by RT PCR (hospital order, performed in [facility] hospital lab) Nasopharyngeal Nasopharyngeal Swab     Status: None   Collection Time: 07/22/19  1:05 AM   Specimen: Nasopharyngeal Swab  Result Value Ref Range Status   SARS Coronavirus 2 NEGATIVE NEGATIVE Final    Comment: (NOTE) SARS-CoV-2 target nucleic acids are NOT DETECTED.  The SARS-CoV-2 RNA is generally detectable in upper and lower respiratory specimens during the acute phase of infection. The lowest concentration of SARS-CoV-2 viral copies this assay can detect is 250 copies / mL. A negative result does not preclude SARS-CoV-2 infection and should not be used as the sole basis for treatment or other patient management decisions.  A negative result may occur with improper specimen collection / handling, submission of specimen other than nasopharyngeal swab, presence of viral mutation(s) within the areas targeted by this assay, and inadequate number of viral copies (<250 copies / mL). A negative result must be combined with clinical observations, patient history, and epidemiological information.  Fact Sheet for Patients:   StrictlyIdeas.no  Fact Sheet for Healthcare  Providers: BankingDealers.co.za  This test is not yet approved or  cleared by the [place] FDA and has been authorized for detection and/or diagnosis of SARS-CoV-2 by FDA under an Emergency Use Authorization (EUA).  This EUA will remain in effect (meaning this test can be used) for the duration of the COVID-19 declaration under Section 564(b)(1) of the Act, 21 U.S.C. section 360bbb-3(b)(1), unless the authorization is terminated or revoked sooner.  Performed at [facility], [place] [address] [place], [place] [address]      Labs: Basic Metabolic Panel: Recent Labs  Lab 07/21/19 2316 07/22/19 0519  NA 124* 130*  K 4.2 4.5  CL 93* 97*  CO2 20* 23  GLUCOSE 356* 100*  BUN 53* 51*  CREATININE 2.97* 2.83*  CALCIUM 8.3* 8.7*   Liver Function Tests: No results for input(s): AST, ALT, ALKPHOS, BILITOT, PROT, ALBUMIN in the last 168 hours. CBC: Recent Labs  Lab 07/21/19 2316  WBC 5.5  NEUTROABS 4.6  HGB 8.2*  HCT 24.6*  MCV 86.3  PLT 235   CBG: Recent Labs  Lab 07/22/19 0108 07/22/19 0349 07/22/19 0725 07/22/19 1138  GLUCAP 234* 84 148* 89   Hgb A1c Recent Labs    07/22/19 0519  HGBA1C 5.3   Lipid Profile No results for input(s): CHOL, HDL, LDLCALC, TRIG, CHOLHDL, LDLDIRECT in the last 72 hours. Thyroid function studies No results for input(s): TSH, T4TOTAL, T3FREE, THYROIDAB in the last 72 hours.  Invalid input(s): FREET3 Urinalysis    Component Value Date/Time   COLORURINE YELLOW 07/21/2019 [place] 07/21/2019 2346   LABSPEC 1.006 07/21/2019 2346   PHURINE 5.0 07/21/2019 2346   GLUCOSEU >=500 (A) 07/21/2019 2346   HGBUR NEGATIVE 07/21/2019 2346   [place] NEGATIVE 07/21/2019 2346   KETONESUR NEGATIVE 07/21/2019 2346   PROTEINUR NEGATIVE 07/21/2019 2346   UROBILINOGEN 0.2 11/14/2014 1946   NITRITE NEGATIVE 07/21/2019 2346   LEUKOCYTESUR NEGATIVE 07/21/2019 2346    FURTHER DISCHARGE  INSTRUCTIONS:   Get Medicines reviewed and adjusted: Please take all your medications with you for your next visit with your Primary MD   Laboratory/radiological data: Please request your Primary MD to go over all hospital tests and procedure/radiological results at the follow up, please ask your Primary MD to get all Hospital records sent to his/her office.   In some cases, they will be blood work, cultures and biopsy results pending at the time of your discharge. Please request that your primary care M.D. goes through all the records of your hospital data and follows up on these results.   Also Note the following: If you experience worsening of your admission symptoms, develop shortness of breath, life threatening emergency, suicidal or homicidal thoughts you must seek medical attention immediately by calling 911 or calling your MD immediately  if symptoms less severe.   You must read complete instructions/literature along with all the possible adverse reactions/side effects for all the Medicines you take and that have been prescribed to you. Take any new Medicines after you have completely understood and accpet all the possible adverse reactions/side effects.    Do not drive when taking Pain medications or sleeping medications (Benzodaizepines)   Do not take more than prescribed Pain, Sleep and Anxiety Medications. It is not advisable to combine anxiety,sleep and pain medications without talking with your primary care practitioner   Special Instructions: If you have smoked or chewed Tobacco  in the last 2 yrs please stop smoking, stop any regular Alcohol  and or any Recreational drug use.   Wear Seat belts while driving.   Please note: You were cared for by a hospitalist during your hospital stay. Once you are discharged, your primary care physician will handle any further medical issues. Please note that NO REFILLS for any discharge medications will be authorized once you are discharged,  as it is imperative that you return to your primary care physician (or establish a relationship with a primary care physician if you do not have one) for your post hospital discharge needs so that they can reassess your need for medications and monitor your lab values.  Time coordinating discharge: 40 minutes  SIGNED:  Marzetta Board, MD, PhD 07/22/2019, 2:10 PM

## 2019-07-23 ENCOUNTER — Emergency Department (HOSPITAL_COMMUNITY): Payer: Medicare Other

## 2019-07-23 ENCOUNTER — Other Ambulatory Visit: Payer: Self-pay

## 2019-07-23 ENCOUNTER — Encounter (HOSPITAL_COMMUNITY): Payer: Self-pay | Admitting: Pediatrics

## 2019-07-23 ENCOUNTER — Inpatient Hospital Stay (HOSPITAL_COMMUNITY)
Admission: EM | Admit: 2019-07-23 | Discharge: 2019-07-27 | DRG: 291 | Disposition: A | Discharge status: Skilled Nursing Facility | Payer: Medicare Other | Attending: Internal Medicine | Admitting: Internal Medicine

## 2019-07-23 DIAGNOSIS — E875 Hyperkalemia: Secondary | ICD-10-CM | POA: Diagnosis present

## 2019-07-23 DIAGNOSIS — R609 Edema, unspecified: Secondary | ICD-10-CM | POA: Diagnosis not present

## 2019-07-23 DIAGNOSIS — I509 Heart failure, unspecified: Secondary | ICD-10-CM

## 2019-07-23 DIAGNOSIS — Z794 Long term (current) use of insulin: Secondary | ICD-10-CM | POA: Diagnosis not present

## 2019-07-23 DIAGNOSIS — I5032 Chronic diastolic (congestive) heart failure: Secondary | ICD-10-CM | POA: Diagnosis not present

## 2019-07-23 DIAGNOSIS — J9601 Acute respiratory failure with hypoxia: Secondary | ICD-10-CM | POA: Diagnosis present

## 2019-07-23 DIAGNOSIS — E1165 Type 2 diabetes mellitus with hyperglycemia: Secondary | ICD-10-CM | POA: Diagnosis not present

## 2019-07-23 DIAGNOSIS — E119 Type 2 diabetes mellitus without complications: Secondary | ICD-10-CM

## 2019-07-23 DIAGNOSIS — Z8673 Personal history of transient ischemic attack (TIA), and cerebral infarction without residual deficits: Secondary | ICD-10-CM

## 2019-07-23 DIAGNOSIS — Z888 Allergy status to other drugs, medicaments and biological substances status: Secondary | ICD-10-CM

## 2019-07-23 DIAGNOSIS — M2550 Pain in unspecified joint: Secondary | ICD-10-CM | POA: Diagnosis not present

## 2019-07-23 DIAGNOSIS — R197 Diarrhea, unspecified: Secondary | ICD-10-CM | POA: Diagnosis not present

## 2019-07-23 DIAGNOSIS — R0602 Shortness of breath: Secondary | ICD-10-CM | POA: Diagnosis not present

## 2019-07-23 DIAGNOSIS — Z6841 Body Mass Index (BMI) 40.0 and over, adult: Secondary | ICD-10-CM

## 2019-07-23 DIAGNOSIS — I440 Atrioventricular block, first degree: Secondary | ICD-10-CM | POA: Diagnosis not present

## 2019-07-23 DIAGNOSIS — E871 Hypo-osmolality and hyponatremia: Secondary | ICD-10-CM | POA: Diagnosis present

## 2019-07-23 DIAGNOSIS — D649 Anemia, unspecified: Secondary | ICD-10-CM

## 2019-07-23 DIAGNOSIS — G40909 Epilepsy, unspecified, not intractable, without status epilepticus: Secondary | ICD-10-CM | POA: Diagnosis present

## 2019-07-23 DIAGNOSIS — I517 Cardiomegaly: Secondary | ICD-10-CM | POA: Diagnosis not present

## 2019-07-23 DIAGNOSIS — R0902 Hypoxemia: Secondary | ICD-10-CM | POA: Diagnosis not present

## 2019-07-23 DIAGNOSIS — N179 Acute kidney failure, unspecified: Secondary | ICD-10-CM | POA: Diagnosis not present

## 2019-07-23 DIAGNOSIS — I5031 Acute diastolic (congestive) heart failure: Secondary | ICD-10-CM | POA: Diagnosis not present

## 2019-07-23 DIAGNOSIS — N2581 Secondary hyperparathyroidism of renal origin: Secondary | ICD-10-CM | POA: Diagnosis present

## 2019-07-23 DIAGNOSIS — E8889 Other specified metabolic disorders: Secondary | ICD-10-CM | POA: Diagnosis present

## 2019-07-23 DIAGNOSIS — I480 Paroxysmal atrial fibrillation: Secondary | ICD-10-CM | POA: Diagnosis not present

## 2019-07-23 DIAGNOSIS — I130 Hypertensive heart and chronic kidney disease with heart failure and stage 1 through stage 4 chronic kidney disease, or unspecified chronic kidney disease: Principal | ICD-10-CM | POA: Diagnosis present

## 2019-07-23 DIAGNOSIS — W19XXXA Unspecified fall, initial encounter: Secondary | ICD-10-CM | POA: Diagnosis present

## 2019-07-23 DIAGNOSIS — Z86718 Personal history of other venous thrombosis and embolism: Secondary | ICD-10-CM

## 2019-07-23 DIAGNOSIS — Z7722 Contact with and (suspected) exposure to environmental tobacco smoke (acute) (chronic): Secondary | ICD-10-CM | POA: Diagnosis present

## 2019-07-23 DIAGNOSIS — R001 Bradycardia, unspecified: Secondary | ICD-10-CM | POA: Diagnosis not present

## 2019-07-23 DIAGNOSIS — Z7401 Bed confinement status: Secondary | ICD-10-CM | POA: Diagnosis not present

## 2019-07-23 DIAGNOSIS — R5381 Other malaise: Secondary | ICD-10-CM | POA: Diagnosis not present

## 2019-07-23 DIAGNOSIS — I498 Other specified cardiac arrhythmias: Secondary | ICD-10-CM | POA: Diagnosis not present

## 2019-07-23 DIAGNOSIS — E1122 Type 2 diabetes mellitus with diabetic chronic kidney disease: Secondary | ICD-10-CM | POA: Diagnosis not present

## 2019-07-23 DIAGNOSIS — R778 Other specified abnormalities of plasma proteins: Secondary | ICD-10-CM | POA: Diagnosis present

## 2019-07-23 DIAGNOSIS — N184 Chronic kidney disease, stage 4 (severe): Secondary | ICD-10-CM | POA: Diagnosis present

## 2019-07-23 DIAGNOSIS — F329 Major depressive disorder, single episode, unspecified: Secondary | ICD-10-CM | POA: Diagnosis present

## 2019-07-23 DIAGNOSIS — R278 Other lack of coordination: Secondary | ICD-10-CM | POA: Diagnosis not present

## 2019-07-23 DIAGNOSIS — G4733 Obstructive sleep apnea (adult) (pediatric): Secondary | ICD-10-CM | POA: Diagnosis present

## 2019-07-23 DIAGNOSIS — Z743 Need for continuous supervision: Secondary | ICD-10-CM | POA: Diagnosis not present

## 2019-07-23 DIAGNOSIS — J90 Pleural effusion, not elsewhere classified: Secondary | ICD-10-CM | POA: Diagnosis not present

## 2019-07-23 DIAGNOSIS — E611 Iron deficiency: Secondary | ICD-10-CM | POA: Diagnosis not present

## 2019-07-23 DIAGNOSIS — I110 Hypertensive heart disease with heart failure: Secondary | ICD-10-CM | POA: Diagnosis not present

## 2019-07-23 DIAGNOSIS — I10 Essential (primary) hypertension: Secondary | ICD-10-CM | POA: Diagnosis not present

## 2019-07-23 DIAGNOSIS — R55 Syncope and collapse: Secondary | ICD-10-CM | POA: Diagnosis present

## 2019-07-23 DIAGNOSIS — I5033 Acute on chronic diastolic (congestive) heart failure: Secondary | ICD-10-CM | POA: Diagnosis present

## 2019-07-23 DIAGNOSIS — I959 Hypotension, unspecified: Secondary | ICD-10-CM | POA: Diagnosis not present

## 2019-07-23 DIAGNOSIS — K219 Gastro-esophageal reflux disease without esophagitis: Secondary | ICD-10-CM | POA: Diagnosis present

## 2019-07-23 DIAGNOSIS — Z79899 Other long term (current) drug therapy: Secondary | ICD-10-CM

## 2019-07-23 DIAGNOSIS — D638 Anemia in other chronic diseases classified elsewhere: Secondary | ICD-10-CM | POA: Diagnosis not present

## 2019-07-23 DIAGNOSIS — D631 Anemia in chronic kidney disease: Secondary | ICD-10-CM | POA: Diagnosis not present

## 2019-07-23 DIAGNOSIS — I248 Other forms of acute ischemic heart disease: Secondary | ICD-10-CM | POA: Diagnosis not present

## 2019-07-23 DIAGNOSIS — E782 Mixed hyperlipidemia: Secondary | ICD-10-CM | POA: Diagnosis not present

## 2019-07-23 DIAGNOSIS — Z20822 Contact with and (suspected) exposure to covid-19: Secondary | ICD-10-CM | POA: Diagnosis not present

## 2019-07-23 DIAGNOSIS — E785 Hyperlipidemia, unspecified: Secondary | ICD-10-CM | POA: Diagnosis present

## 2019-07-23 DIAGNOSIS — I129 Hypertensive chronic kidney disease with stage 1 through stage 4 chronic kidney disease, or unspecified chronic kidney disease: Secondary | ICD-10-CM | POA: Diagnosis not present

## 2019-07-23 DIAGNOSIS — Z885 Allergy status to narcotic agent status: Secondary | ICD-10-CM

## 2019-07-23 DIAGNOSIS — Z8249 Family history of ischemic heart disease and other diseases of the circulatory system: Secondary | ICD-10-CM

## 2019-07-23 DIAGNOSIS — R6889 Other general symptoms and signs: Secondary | ICD-10-CM | POA: Diagnosis not present

## 2019-07-23 DIAGNOSIS — I5021 Acute systolic (congestive) heart failure: Secondary | ICD-10-CM | POA: Diagnosis not present

## 2019-07-23 DIAGNOSIS — R2681 Unsteadiness on feet: Secondary | ICD-10-CM | POA: Diagnosis not present

## 2019-07-23 DIAGNOSIS — Z7982 Long term (current) use of aspirin: Secondary | ICD-10-CM

## 2019-07-23 DIAGNOSIS — R569 Unspecified convulsions: Secondary | ICD-10-CM | POA: Diagnosis not present

## 2019-07-23 DIAGNOSIS — E1151 Type 2 diabetes mellitus with diabetic peripheral angiopathy without gangrene: Secondary | ICD-10-CM | POA: Diagnosis present

## 2019-07-23 DIAGNOSIS — R0989 Other specified symptoms and signs involving the circulatory and respiratory systems: Secondary | ICD-10-CM | POA: Diagnosis not present

## 2019-07-23 DIAGNOSIS — M6281 Muscle weakness (generalized): Secondary | ICD-10-CM | POA: Diagnosis not present

## 2019-07-23 LAB — CBC WITH DIFFERENTIAL/PLATELET
Abs Immature Granulocytes: 0.08 10*3/uL — ABNORMAL HIGH (ref 0.00–0.07)
Basophils Absolute: 0 10*3/uL (ref 0.0–0.1)
Basophils Relative: 0 %
Eosinophils Absolute: 0 10*3/uL (ref 0.0–0.5)
Eosinophils Relative: 0 %
HCT: 26.8 % — ABNORMAL LOW (ref 36.0–46.0)
Hemoglobin: 8.9 g/dL — ABNORMAL LOW (ref 12.0–15.0)
Immature Granulocytes: 1 %
Lymphocytes Relative: 2 %
Lymphs Abs: 0.2 10*3/uL — ABNORMAL LOW (ref 0.7–4.0)
MCH: 28.8 pg (ref 26.0–34.0)
MCHC: 33.2 g/dL (ref 30.0–36.0)
MCV: 86.7 fL (ref 80.0–100.0)
Monocytes Absolute: 0.8 10*3/uL (ref 0.1–1.0)
Monocytes Relative: 7 %
Neutro Abs: 10.5 10*3/uL — ABNORMAL HIGH (ref 1.7–7.7)
Neutrophils Relative %: 90 %
Platelets: 254 10*3/uL (ref 150–400)
RBC: 3.09 MIL/uL — ABNORMAL LOW (ref 3.87–5.11)
RDW: 13.7 % (ref 11.5–15.5)
WBC: 11.6 10*3/uL — ABNORMAL HIGH (ref 4.0–10.5)
nRBC: 0 % (ref 0.0–0.2)

## 2019-07-23 LAB — TROPONIN I (HIGH SENSITIVITY)
Troponin I (High Sensitivity): 54 ng/L — ABNORMAL HIGH (ref <18)
Troponin I (High Sensitivity): 82 ng/L — ABNORMAL HIGH (ref <18)

## 2019-07-23 LAB — COMPREHENSIVE METABOLIC PANEL
ALT: 17 U/L (ref 0–44)
AST: 18 U/L (ref 15–41)
Albumin: 3.6 g/dL (ref 3.5–5.0)
Alkaline Phosphatase: 89 U/L (ref 38–126)
Anion gap: 15 (ref 5–15)
BUN: 63 mg/dL — ABNORMAL HIGH (ref 8–23)
CO2: 19 mmol/L — ABNORMAL LOW (ref 22–32)
Calcium: 8.9 mg/dL (ref 8.9–10.3)
Chloride: 94 mmol/L — ABNORMAL LOW (ref 98–111)
Creatinine, Ser: 3.36 mg/dL — ABNORMAL HIGH (ref 0.44–1.00)
GFR calc Af Amer: 15 mL/min — ABNORMAL LOW (ref >60)
GFR calc non Af Amer: 13 mL/min — ABNORMAL LOW (ref >60)
Glucose, Bld: 239 mg/dL — ABNORMAL HIGH (ref 70–99)
Potassium: 5.3 mmol/L — ABNORMAL HIGH (ref 3.5–5.1)
Sodium: 128 mmol/L — ABNORMAL LOW (ref 135–145)
Total Bilirubin: 0.9 mg/dL (ref 0.3–1.2)
Total Protein: 7.3 g/dL (ref 6.5–8.1)

## 2019-07-23 LAB — SARS CORONAVIRUS 2 BY RT PCR (HOSPITAL ORDER, PERFORMED IN ~~LOC~~ HOSPITAL LAB): SARS Coronavirus 2: NEGATIVE

## 2019-07-23 LAB — MAGNESIUM: Magnesium: 2.2 mg/dL (ref 1.7–2.4)

## 2019-07-23 LAB — BRAIN NATRIURETIC PEPTIDE: B Natriuretic Peptide: 3173.5 pg/mL — ABNORMAL HIGH (ref 0.0–100.0)

## 2019-07-23 MED ORDER — FUROSEMIDE 10 MG/ML IJ SOLN
60.0000 mg | Freq: Once | INTRAMUSCULAR | Status: AC
  Administered 2019-07-23: 60 mg via INTRAVENOUS
  Filled 2019-07-23: qty 6

## 2019-07-23 NOTE — ED Provider Notes (Signed)
[name] Provider Note   CSN: [id number] Arrival date & time: 07/23/19  1819     History Chief Complaint  Patient presents with  . Fall  . Shortness of Breath    IVAH Martin is a 74 y.o. female.  Presents to emergency room with complaints of shortness of breath.  Patient reports starting this morning and throughout the day today she has had increase shortness of breath, fell earlier due to feeling weak in the legs.  Did not hit her head, denies any major trauma.  No chest pain, abdominal pain.  Does have some pain and swelling in her legs.  Recent admission for near syncope, suspected dehydration, her home diuretic therapy was held.  Chronic diastolic heart failure, CKD, diabetes, DVT, GERD, hyperlipidemia, morbid obesity  HPI     Past Medical History:  Diagnosis Date  . 1st degree AV block 05/02/2017  . Anemia   . Arterial embolism and thrombosis of upper extremity ([place])   . Chronic diastolic (congestive) heart failure ([place])    a. 04/2017 Echo: EF 60-65%. Gr1 DD. Mod LVH.   . CKD (chronic kidney disease) stage 4, GFR 15-29 ml/min (HCC) 05/02/2017  . Depression   . Diabetes mellitus type 2, insulin dependent ([place]) 05/02/2017  . DVT (deep venous thrombosis) ([place])   . Dyslipidemia 05/02/2017  . Essential hypertension   . GERD (gastroesophageal reflux disease)   . Hyperlipidemia   . Junctional bradycardia   . Morbid obesity with BMI of 45.0-49.9, adult ([place]) 05/02/2017  . OSA (obstructive sleep apnea) 10/17/2012  . Rhabdomyolysis 09/25/2012  . Seizures ([place])    "epilepsy; they think I had one a couple months ago" (12/11/2014)  . Symptomatic bradycardia 11/27/2016    Patient Active Problem List   Diagnosis Date Noted  . Hyponatremia 07/22/2019  . Near syncope 07/22/2019  . Middle cerebral artery embolism, left 03/07/2019  . Chronic diastolic CHF (congestive heart failure) ([place]) 03/04/2019  . Acute respiratory failure with hypoxia ([place])  05/02/2017  . Seizures disorder 05/02/2017  . Diabetes mellitus type 2, insulin dependent ([place]) 05/02/2017  . Morbid obesity with BMI of 45.0-49.9, adult ([place]) 05/02/2017  . Anemia 05/02/2017  . CKD (chronic kidney disease) stage 4, GFR 15-29 ml/min (HCC) 05/02/2017  . Dyslipidemia 05/02/2017  . Acute hyperkalemia 05/02/2017  . 1st degree AV block 05/02/2017  . Symptomatic bradycardia 11/27/2016  . DVT (deep venous thrombosis) ([place]) 06/30/2015  . Bradycardia with 31-40 beats per minute   . Emesis   . Junctional bradycardia   . Arterial embolism and thrombosis of upper extremity ([place])   . Bradycardia 12/11/2014  . Vomiting 12/11/2014  . Acute renal failure ([place]) 12/11/2014  . Acute renal failure superimposed on stage 4 chronic kidney disease ([place])   . Essential hypertension   . Type II diabetes mellitus ([place])   . OSA (obstructive sleep apnea) 10/17/2012  . Elevated troponin 09/26/2012  . Acute encephalopathy 09/25/2012  . Tremor 09/25/2012  . Rhabdomyolysis 09/25/2012  . Morbid obesity ([place]) 09/16/2006  . DEPRESSION, CHRONIC 09/16/2006  . Seizure ([place]) 09/16/2006  . Hyperlipidemia 09/09/2006  . CONSTIPATION, HX OF 09/09/2006    Past Surgical History:  Procedure Laterality Date  . implantable loop recorder  06/06/2019    Medtronic Reveal Linq model LNQ 11 SN [id number] implantable loop recorder implanted in office by Dr Rayann Heman for cryptogenic stroke  . IR CT HEAD LTD  03/07/2019  . IR PERCUTANEOUS ART THROMBECTOMY/INFUSION INTRACRANIAL INC DIAG ANGIO  03/07/2019  . RADIOLOGY WITH ANESTHESIA N/A 03/07/2019   Procedure: IR WITH ANESTHESIA;  Surgeon: Radiologist, Medication, MD;  Location: [facility];  Service: Radiology;  Laterality: N/A;  . THROMBECTOMY BRACHIAL ARTERY Left 12/15/2014   Procedure: THROMBECTOMY BRACHIAL ARTERY; VEIN PATCH ANGIOPLASTY LEFT BRACIAL ARTERY;  Surgeon: Angelia Mould, MD;  Location: [facility];  Service: Vascular;  Laterality: Left;  . TUBAL LIGATION        OB History   No obstetric history on file.     Family History  Problem Relation Age of Onset  . Hypertension Mother        died @ 56 - unknown cause.  [name] Hypertension Father        says she doesn't know anything about her father.  . Cancer Maternal Aunt   . Seizures Neg Hx     Social History   Tobacco Use  . Smoking status: Never Smoker  . Smokeless tobacco: Never Used  . Tobacco comment: EXPOSED TO 2ND HAND SMOKE X 20+ YEARS.   Vaping Use  . Vaping Use: Never used  Substance Use Topics  . Alcohol use: No  . Drug use: No    Home Medications Prior to Admission medications   Medication Sig Start Date End Date Taking? Authorizing Provider  ACCU-CHEK AVIVA PLUS test strip 3 (three) times daily. for testing 04/14/18   [provider]  Accu-Chek Softclix Lancets lancets See admin instructions. 12/29/17   [provider]  amLODipine (NORVASC) 10 MG tablet Take 10 mg by mouth daily. 05/05/19   [provider]  Ascorbic Acid (VITAMIN C PO) Take 1 tablet by mouth daily.    [provider]  aspirin 81 MG chewable tablet Chew 1 tablet (81 mg total) by mouth daily. 12/21/14   Rai, Vernelle Emerald, MD  B-D ULTRAFINE III SHORT PEN 31G X 8 MM MISC See admin instructions. 04/18/18   [provider]  carbamazepine (TEGRETOL) 200 MG tablet Take 400 mg by mouth 2 (two) times daily.     [provider]  cetirizine (ZYRTEC) 10 MG tablet Take 10 mg by mouth daily. 07/19/19   [provider]  citalopram (CELEXA) 20 MG tablet Take 20 mg by mouth daily.    [provider]  ezetimibe-simvastatin (VYTORIN) 10-40 MG per tablet Take 1 tablet by mouth daily.    [provider]  glipiZIDE (GLUCOTROL XL) 5 MG 24 hr tablet Take 5 mg by mouth daily with breakfast.  03/24/18   [provider]  HUMULIN 70/30 KWIKPEN (70-30) 100 UNIT/ML PEN Inject 5 Units into the skin 2 (two) times daily. 05/06/17   Aline August, MD   hydrALAZINE (APRESOLINE) 25 MG tablet Take 25 mg by mouth 3 (three) times daily. 05/13/19   [provider]  mirtazapine (REMERON) 15 MG tablet Take 15 mg by mouth at bedtime.  05/06/18   [provider]  omeprazole (PRILOSEC) 40 MG capsule Take 40 mg by mouth daily.    [provider]  Vitamin D, Ergocalciferol, (DRISDOL) 1.25 MG (50000 UNIT) CAPS capsule Take 50,000 Units by mouth once a week. 07/09/19   [provider]    Allergies    Codeine and Metoprolol  Review of Systems   Review of Systems  Constitutional: Negative for chills and fever.  HENT: Negative for ear pain and sore throat.   Eyes: Negative for pain and visual disturbance.  Respiratory: Positive for shortness of breath. Negative for cough.   Cardiovascular: Positive for  leg swelling. Negative for chest pain and palpitations.  Gastrointestinal: Negative for abdominal pain and vomiting.  Genitourinary: Negative for dysuria and hematuria.  Musculoskeletal: Negative for arthralgias and back pain.  Skin: Negative for color change and rash.  Neurological: Negative for seizures and syncope.  All other systems reviewed and are negative.   Physical Exam Updated Vital Signs BP (!) 156/55 (BP Location: Left Arm)   Pulse 63   Temp 97.7 F (36.5 C) (Oral)   Resp 20   Ht 5\' 2"  (1.575 m)   Wt 106.5 kg   SpO2 (!) 77%   BMI 42.94 kg/m   Physical Exam Vitals and nursing note reviewed.  Constitutional:      Appearance: She is well-developed.     Comments: Some tachypnea but no distress  HENT:     Head: Normocephalic and atraumatic.  Eyes:     Conjunctiva/sclera: Conjunctivae normal.  Cardiovascular:     Rate and Rhythm: Normal rate and regular rhythm.     Heart sounds: No murmur heard.   Pulmonary:     Comments: Diminished breath sounds at bases, mild tachypnea, no respiratory distress Abdominal:     Palpations: Abdomen is soft.     Tenderness: There is no abdominal tenderness.   Musculoskeletal:     Cervical back: Neck supple.     Comments: Bilateral lower leg swelling, mild, not pitting  Skin:    General: Skin is warm and dry.  Neurological:     General: No focal deficit present.     Mental Status: She is alert.  Psychiatric:        Mood and Affect: Mood normal.     ED Results / Procedures / Treatments   Labs (all labs ordered are listed, but only abnormal results are displayed) Labs Reviewed  CBC WITH DIFFERENTIAL/PLATELET - Abnormal; Notable for the following components:      Result Value   WBC 11.6 (*)    RBC 3.09 (*)    Hemoglobin 8.9 (*)    HCT 26.8 (*)    Neutro Abs 10.5 (*)    Lymphs Abs 0.2 (*)    Abs Immature Granulocytes 0.08 (*)    All other components within normal limits  COMPREHENSIVE METABOLIC PANEL - Abnormal; Notable for the following components:   Sodium 128 (*)    Potassium 5.3 (*)    Chloride 94 (*)    CO2 19 (*)    Glucose, Bld 239 (*)    BUN 63 (*)    Creatinine, Ser 3.36 (*)    GFR calc non Af Amer 13 (*)    GFR calc Af Amer 15 (*)    All other components within normal limits  BRAIN NATRIURETIC PEPTIDE - Abnormal; Notable for the following components:   B Natriuretic Peptide 3,173.5 (*)    All other components within normal limits  TROPONIN I (HIGH SENSITIVITY) - Abnormal; Notable for the following components:   Troponin I (High Sensitivity) 54 (*)    All other components within normal limits  SARS CORONAVIRUS 2 BY RT PCR (HOSPITAL ORDER, [facility])  MAGNESIUM  TROPONIN I (HIGH SENSITIVITY)    EKG None  Radiology CT HEAD WO CONTRAST  Result Date: 07/22/2019 CLINICAL DATA:  Syncope. EXAM: CT HEAD WITHOUT CONTRAST TECHNIQUE: Contiguous axial images were obtained from the base of the skull through the vertex without intravenous contrast. COMPARISON:  CT head 03/07/2019.  MRI head 03/08/2019 FINDINGS: Brain: Small low-density extra-axial fluid collection on  the right compatible with  chronic subdural hematoma. Acute subdural hematoma on the right was seen on the prior CT. Negative for acute hemorrhage. Generalized atrophy with chronic microvascular ischemic changes in the white matter. Hypodensity in the right occipital lobe is new but appears to represent a nonacute infarct. No midline shift. Vascular: Negative for hyperdense vessel. Skull: No focal skeletal lesion. Sinuses/Orbits: Paranasal sinuses clear.  Negative orbit Other: None IMPRESSION: Small chronic subdural hematoma on the right. There was acute subdural blood on the right on the prior CT of 03/07/2019. No acute hemorrhage Chronic infarct in the right occipital lobe which is not identified on the prior MRI. Electronically Signed   By: Franchot Gallo M.D.   On: 07/22/2019 12:04   DG Chest Portable 1 View  Result Date: 07/23/2019 CLINICAL DATA:  Fall, shortness of breath EXAM: PORTABLE CHEST 1 VIEW COMPARISON:  07/21/2019 FINDINGS: Cardiomegaly. Mild vascular congestion. No confluent opacities or effusions. No acute bony abnormality or pneumothorax. Loop recorder noted over the left chest. IMPRESSION: Cardiomegaly, vascular congestion. Electronically Signed   By: Rolm Baptise M.D.   On: 07/23/2019 18:46   DG Chest Port 1 View  Result Date: 07/21/2019 CLINICAL DATA:  Near syncope, dizziness EXAM: PORTABLE CHEST 1 VIEW COMPARISON:  03/07/2019 FINDINGS: Single frontal view of the chest demonstrates loop recorder overlying cardiac apex. The cardiac silhouette remains enlarged. No airspace disease, effusion, or pneumothorax. No acute bony abnormalities. IMPRESSION: 1. Stable enlarged cardiac silhouette. 2. No acute intrathoracic process. Electronically Signed   By: Randa Ngo M.D.   On: 07/21/2019 22:59   VAS [place] LOWER EXTREMITY VENOUS (DVT) (ONLY [facility] & [facility] 7a-7p)  Result Date: 07/23/2019  Lower Venous DVTStudy Indications: Edema.  Limitations: Body habitus and poor ultrasound/tissue interface. Comparison Study: 03/06/19 previous  Performing Technologist: Abram Sander RVS  Examination Guidelines: A complete evaluation includes B-mode imaging, spectral Doppler, color Doppler, and power Doppler as needed of all accessible portions of each vessel. Bilateral testing is considered an integral part of a complete examination. Limited examinations for reoccurring indications may be performed as noted. The reflux portion of the exam is performed with the patient in reverse Trendelenburg.  +---------+---------------+---------+-----------+----------+------------------+ RIGHT    CompressibilityPhasicitySpontaneityPropertiesThrombus Aging     +---------+---------------+---------+-----------+----------+------------------+ CFV      Full           Yes      Yes                                     +---------+---------------+---------+-----------+----------+------------------+ SFJ      Full                                                            +---------+---------------+---------+-----------+----------+------------------+ FV Prox  Full                                                            +---------+---------------+---------+-----------+----------+------------------+ FV Mid                  Yes  Yes                                     +---------+---------------+---------+-----------+----------+------------------+ FV Distal                                             Not visualized     +---------+---------------+---------+-----------+----------+------------------+ PFV      Full                                                            +---------+---------------+---------+-----------+----------+------------------+ POP      Full           Yes      Yes                                     +---------+---------------+---------+-----------+----------+------------------+ PTV      Full                                         limited                                                                   visualization      +---------+---------------+---------+-----------+----------+------------------+ PERO     Full                                         limited                                                                  visualization      +---------+---------------+---------+-----------+----------+------------------+   +---------+---------------+---------+-----------+----------+------------------+ LEFT     CompressibilityPhasicitySpontaneityPropertiesThrombus Aging     +---------+---------------+---------+-----------+----------+------------------+ CFV      Full           Yes      Yes                                     +---------+---------------+---------+-----------+----------+------------------+ SFJ      Full                                                            +---------+---------------+---------+-----------+----------+------------------+  FV Prox  Full                                                            +---------+---------------+---------+-----------+----------+------------------+ FV Mid                  Yes      Yes                                     +---------+---------------+---------+-----------+----------+------------------+ FV Distal                                             Not visualized     +---------+---------------+---------+-----------+----------+------------------+ PFV      Full                                                            +---------+---------------+---------+-----------+----------+------------------+ POP      Full           Yes      Yes                                     +---------+---------------+---------+-----------+----------+------------------+ PTV      Full                                         limited                                                                  visualization      +---------+---------------+---------+-----------+----------+------------------+  PERO     Full                                         limited                                                                  visualization      +---------+---------------+---------+-----------+----------+------------------+     Summary: BILATERAL: -No evidence of popliteal cyst, bilaterally. RIGHT: - There is no evidence of deep vein thrombosis in the lower extremity. However, portions of this examination were limited- see technologist comments above.  LEFT: - There is no evidence of deep vein thrombosis in the lower extremity.  However, portions of this examination were limited- see technologist comments above.  *See table(s) above for measurements and observations.    Preliminary     Procedures .Critical Care Performed by: Lucrezia Starch, MD Authorized by: Lucrezia Starch, MD   Critical care provider statement:    Critical care time (minutes):  45   Critical care was necessary to treat or prevent imminent or life-threatening deterioration of the following conditions:  Respiratory failure and circulatory failure   Critical care was time spent personally by me on the following activities:  Discussions with consultants, evaluation of patient's response to treatment, examination of patient, ordering and performing treatments and interventions, ordering and review of laboratory studies, ordering and review of radiographic studies, pulse oximetry, re-evaluation of patient's condition, obtaining history from patient or surrogate and review of old charts   (including critical care time)  Medications Ordered in ED Medications  furosemide (LASIX) injection 60 mg (60 mg Intravenous Given 07/23/19 1953)    ED Course  I have reviewed the triage vital signs and the nursing notes.  Pertinent labs & imaging results that were available during my care of the patient were reviewed by me and considered in my medical decision making (see chart for details).    MDM Rules/Calculators/A&P                          74 year old lady CKD, diastolic heart failure, diabetes presents to ER with shortness of breath.  Profoundly hypoxic with EMS.  Doing well in ER on supplemental nasal cannula.  Work-up concerning for vascular congestion, profoundly elevated BNP.  Her diuretic therapy was recently held due to concern for dehydration after near syncopal episode.  Suspect patient is currently in fluid overload state.  She had noted some pain in her legs, DVT study was negative.  Provided patient Lasix, will admit to hospitalist service.  Dr. Jeronimo Greaves with [facility] hospitalist will admit.  Final Clinical Impression(s) / ED Diagnoses Final diagnoses:  Hypoxia  Heart failure, unspecified HF chronicity, unspecified heart failure type ([place])  Acute respiratory failure with hypoxia [facility])    Rx / DC Orders ED Discharge Orders    None       Lucrezia Starch, MD 07/23/19 2014

## 2019-07-23 NOTE — Progress Notes (Signed)
Lower extremity venous has been completed.   Preliminary results in CV Proc.   Abram Sander 07/23/2019 7:10 PM

## 2019-07-23 NOTE — ED Notes (Signed)
Dr. Myna Hidalgo ( admittting MD) notified on patient's elevated Troponin result .

## 2019-07-23 NOTE — H&P (Signed)
History and Physical    [name]:[id number] DOB: 10/28/1945 DOA: 07/23/2019  PCP: Paula Mccreedy, MD   Patient coming from: Home   Chief Complaint: SOB, fall   HPI: [name] is a 74 y.o. female with medical history significant for hypertension, chronic anemia, chronic kidney disease stage IV, insulin-dependent diabetes mellitus, hypertension, BMI 43, and seizure disorder, now presenting to the emergency department with shortness of breath and a fall.  Patient was discharged from the hospital yesterday after admission for near syncope, was seen by cardiology, noted to have junctional bradycardia, had her diuretics held, and went home in improved and stable condition but has developed progressive exertional dyspnea since then, has ongoing near syncope and fell today due to this.  She denies any fevers, chills, chest pain, or palpitations, but reports becoming short of breath with minimal exertion and feeling as though she is going to pass out when she gets up to go to the bathroom.  She denies any headache, change in vision or hearing, or new focal numbness or weakness.  ED Course: Upon arrival to the ED, patient is found to be afebrile, saturating upper 70s on room air, and with stable blood pressure.  EKG features sinus or ectopic atrial rhythm with PACs and first-degree AV nodal block.  Chest x-ray is notable for cardiomegaly and vascular congestion.  Lower extremity ultrasound was limited study been negative for DVT.  Chemistry panel features sodium 128, glucose 238, BUN 63, and creatinine 3.36, up from 2.83 yesterday.  CBC with stable normocytic anemia.  Troponin is mildly elevated and BNP elevated to 3174.  The patient was given 60 mg IV Lasix in the ED.  COVID-19 screening test not yet resulted.  Review of Systems:  All other systems reviewed and apart from HPI, are negative.  Past Medical History:  Diagnosis Date  . 1st degree AV block 05/02/2017  . Anemia   . Arterial  embolism and thrombosis of upper extremity ([place])   . Chronic diastolic (congestive) heart failure ([place])    a. 04/2017 Echo: EF 60-65%. Gr1 DD. Mod LVH.   . CKD (chronic kidney disease) stage 4, GFR 15-29 ml/min ([facility]) 05/02/2017  . Depression   . Diabetes mellitus type 2, insulin dependent ([place]) 05/02/2017  . DVT (deep venous thrombosis) ([place])   . Dyslipidemia 05/02/2017  . Essential hypertension   . GERD (gastroesophageal reflux disease)   . Hyperlipidemia   . Junctional bradycardia   . Morbid obesity with BMI of 45.0-49.9, adult ([place]) 05/02/2017  . OSA (obstructive sleep apnea) 10/17/2012  . Rhabdomyolysis 09/25/2012  . Seizures ([place])    "epilepsy; they think I had one a couple months ago" (12/11/2014)  . Symptomatic bradycardia 11/27/2016    Past Surgical History:  Procedure Laterality Date  . implantable loop recorder  06/06/2019    Medtronic Reveal Linq model LNQ 11 SN [id number] S implantable loop recorder implanted in office by Dr Rayann Heman for cryptogenic stroke  . IR CT HEAD LTD  03/07/2019  . IR PERCUTANEOUS ART THROMBECTOMY/INFUSION INTRACRANIAL INC DIAG ANGIO  03/07/2019  . RADIOLOGY WITH ANESTHESIA N/A 03/07/2019   Procedure: IR WITH ANESTHESIA;  Surgeon: Radiologist, Medication, MD;  Location: [facility];  Service: Radiology;  Laterality: N/A;  . THROMBECTOMY BRACHIAL ARTERY Left 12/15/2014   Procedure: THROMBECTOMY BRACHIAL ARTERY; VEIN PATCH ANGIOPLASTY LEFT BRACIAL ARTERY;  Surgeon: Angelia Mould, MD;  Location: [facility];  Service: Vascular;  Laterality: Left;  . TUBAL LIGATION  reports that she has never smoked. She has never used smokeless tobacco. She reports that she does not drink alcohol and does not use drugs.  Allergies  Allergen Reactions  . Codeine Nausea And Vomiting  . Metoprolol Other (See Comments)    Severe bradycardia.  Now getting admitted for 3rd time in 3 years for bradycardia onset after being started on toprol.  Please dont put patient on this med anymore!      Family History  Problem Relation Age of Onset  . Hypertension Mother        died @ 69 - unknown cause.  [name] Hypertension Father        says she doesn't know anything about her father.  . Cancer Maternal Aunt   . Seizures Neg Hx      Prior to Admission medications   Medication Sig Start Date End Date Taking? Authorizing Provider  amLODipine (NORVASC) 10 MG tablet Take 10 mg by mouth daily. 05/05/19  Yes [provider]  aspirin 81 MG chewable tablet Chew 1 tablet (81 mg total) by mouth daily. 12/21/14  Yes Rai, Ripudeep K, MD  carbamazepine (TEGRETOL) 200 MG tablet Take 400 mg by mouth 2 (two) times daily.    Yes [provider]  cetirizine (ZYRTEC) 10 MG tablet Take 10 mg by mouth daily. 07/19/19  Yes [provider]  citalopram (CELEXA) 20 MG tablet Take 20 mg by mouth daily.   Yes [provider]  ezetimibe-simvastatin (VYTORIN) 10-40 MG per tablet Take 1 tablet by mouth daily.   Yes [provider]  glipiZIDE (GLUCOTROL XL) 5 MG 24 hr tablet Take 5 mg by mouth daily with breakfast.  03/24/18  Yes [provider]  HUMULIN 70/30 KWIKPEN (70-30) 100 UNIT/ML PEN Inject 5 Units into the skin 2 (two) times daily. 05/06/17  Yes Aline August, MD  hydrALAZINE (APRESOLINE) 25 MG tablet Take 25 mg by mouth daily.  05/13/19  Yes [provider]  omeprazole (PRILOSEC) 40 MG capsule Take 40 mg by mouth daily.   Yes [provider]  Vitamin D, Ergocalciferol, (DRISDOL) 1.25 MG (50000 UNIT) CAPS capsule Take 50,000 Units by mouth once a week. 07/09/19  Yes [provider]  ACCU-CHEK AVIVA PLUS test strip 3 (three) times daily. for testing 04/14/18   [provider]  Accu-Chek Softclix Lancets lancets See admin instructions. 12/29/17   [provider]  Ascorbic Acid (VITAMIN C PO) Take 1 tablet by mouth daily.    [provider]  B-D ULTRAFINE III SHORT PEN 31G X 8 MM MISC See admin instructions.  04/18/18   [provider]  mirtazapine (REMERON) 15 MG tablet Take 15 mg by mouth at bedtime.  05/06/18   [provider]    Physical Exam: Vitals:   07/23/19 1826 07/23/19 1827 07/23/19 2014  BP: (!) 156/55  107/84  Pulse: 63  62  Resp: 20  16  Temp: 97.7 F (36.5 C)  98.5 F (36.9 C)  TempSrc: Oral  Temporal  SpO2: (!) 77%  100%  Weight:  106.5 kg   Height:  5\' 2"  (1.575 m)     Constitutional: Not in acute distress, calm  Eyes: PERTLA, lids and conjunctivae normal ENMT: Mucous membranes are moist. Posterior pharynx clear of any exudate or lesions.   Neck: normal, supple, no masses, no thyromegaly Respiratory: Dyspneic with speech. No pallor or cyanosis.  Cardiovascular: S1 & S2 heard, regular rate and rhythm. Trace pretibial edema bilaterally.  Abdomen:  No distension, no tenderness, soft. Bowel sounds active.  Musculoskeletal: no clubbing / cyanosis. No joint deformity upper and lower extremities.   Skin: no significant rashes, lesions, ulcers. Warm, dry, well-perfused. Neurologic: No gross facial asymmetry. Sensation intact. Moving all extremities.  Psychiatric: Alert and oriented to person, place, and situation. Calm and cooperative.    Labs and Imaging on Admission: I have personally reviewed following labs and imaging studies  CBC: Recent Labs  Lab 07/21/19 2316 07/23/19 1834  WBC 5.5 11.6*  NEUTROABS 4.6 10.5*  HGB 8.2* 8.9*  HCT 24.6* 26.8*  MCV 86.3 86.7  PLT 235 469   Basic Metabolic Panel: Recent Labs  Lab 07/21/19 2316 07/22/19 0519 07/23/19 1834  NA 124* 130* 128*  K 4.2 4.5 5.3*  CL 93* 97* 94*  CO2 20* 23 19*  GLUCOSE 356* 100* 239*  BUN 53* 51* 63*  CREATININE 2.97* 2.83* 3.36*  CALCIUM 8.3* 8.7* 8.9  MG  --   --  2.2   GFR: Estimated Creatinine Clearance: 17.4 mL/min (A) (by C-G formula based on SCr of 3.36 mg/dL (H)). Liver Function Tests: Recent Labs  Lab 07/23/19 1834  AST 18  ALT 17  ALKPHOS 89  BILITOT 0.9    PROT 7.3  ALBUMIN 3.6   No results for input(s): LIPASE, AMYLASE in the last 168 hours. No results for input(s): AMMONIA in the last 168 hours. Coagulation Profile: No results for input(s): INR, PROTIME in the last 168 hours. Cardiac Enzymes: No results for input(s): CKTOTAL, CKMB, CKMBINDEX, TROPONINI in the last 168 hours. BNP (last 3 results) No results for input(s): PROBNP in the last 8760 hours. HbA1C: Recent Labs    07/22/19 0519  HGBA1C 5.3   CBG: Recent Labs  Lab 07/22/19 0108 07/22/19 0349 07/22/19 0725 07/22/19 1138  GLUCAP 234* 84 148* 89   Lipid Profile: No results for input(s): CHOL, HDL, LDLCALC, TRIG, CHOLHDL, LDLDIRECT in the last 72 hours. Thyroid Function Tests: No results for input(s): TSH, T4TOTAL, FREET4, T3FREE, THYROIDAB in the last 72 hours. Anemia Panel: No results for input(s): VITAMINB12, FOLATE, FERRITIN, TIBC, IRON, RETICCTPCT in the last 72 hours. Urine analysis:    Component Value Date/Time   COLORURINE YELLOW 07/21/2019 2346   APPEARANCEUR CLEAR 07/21/2019 2346   LABSPEC 1.006 07/21/2019 2346   PHURINE 5.0 07/21/2019 2346   GLUCOSEU >=500 (A) 07/21/2019 2346   HGBUR NEGATIVE 07/21/2019 2346   BILIRUBINUR NEGATIVE 07/21/2019 2346   KETONESUR NEGATIVE 07/21/2019 2346   PROTEINUR NEGATIVE 07/21/2019 2346   UROBILINOGEN 0.2 11/14/2014 1946   NITRITE NEGATIVE 07/21/2019 2346   LEUKOCYTESUR NEGATIVE 07/21/2019 2346   Sepsis Labs: @LABRCNTIP (procalcitonin:4,lacticidven:4) ) Recent Results (from the past 240 hour(s))  SARS Coronavirus 2 by RT PCR (hospital order, performed in [facility] hospital lab) Nasopharyngeal Nasopharyngeal Swab     Status: None   Collection Time: 07/22/19  1:05 AM   Specimen: Nasopharyngeal Swab  Result Value Ref Range Status   SARS Coronavirus 2 NEGATIVE NEGATIVE Final    Comment: (NOTE) SARS-CoV-2 target nucleic acids are NOT DETECTED.  The SARS-CoV-2 RNA is generally detectable in upper and  lower respiratory specimens during the acute phase of infection. The lowest concentration of SARS-CoV-2 viral copies this assay can detect is 250 copies / mL. A negative result does not preclude SARS-CoV-2 infection and should not be used as the sole basis for treatment or other patient management decisions.  A negative result may occur with improper specimen collection / handling, submission of specimen  other than nasopharyngeal swab, presence of viral mutation(s) within the areas targeted by this assay, and inadequate number of viral copies (<250 copies / mL). A negative result must be combined with clinical observations, patient history, and epidemiological information.  Fact Sheet for Patients:   StrictlyIdeas.no  Fact Sheet for Healthcare Providers: BankingDealers.co.za  This test is not yet approved or  cleared by the [place] FDA and has been authorized for detection and/or diagnosis of SARS-CoV-2 by FDA under an Emergency Use Authorization (EUA).  This EUA will remain in effect (meaning this test can be used) for the duration of the COVID-19 declaration under Section 564(b)(1) of the Act, 21 U.S.C. section 360bbb-3(b)(1), unless the authorization is terminated or revoked sooner.  Performed at [facility], [place] [address] [place], [place] [address]      Radiological Exams on Admission: CT HEAD WO CONTRAST  Result Date: 07/22/2019 CLINICAL DATA:  Syncope. EXAM: CT HEAD WITHOUT CONTRAST TECHNIQUE: Contiguous axial images were obtained from the base of the skull through the vertex without intravenous contrast. COMPARISON:  CT head 03/07/2019.  MRI head 03/08/2019 FINDINGS: Brain: Small low-density extra-axial fluid collection on the right compatible with chronic subdural hematoma. Acute subdural hematoma on the right was seen on the prior CT. Negative for acute hemorrhage. Generalized atrophy with chronic microvascular  ischemic changes in the white matter. Hypodensity in the right occipital lobe is new but appears to represent a nonacute infarct. No midline shift. Vascular: Negative for hyperdense vessel. Skull: No focal skeletal lesion. Sinuses/Orbits: Paranasal sinuses clear.  Negative orbit Other: None IMPRESSION: Small chronic subdural hematoma on the right. There was acute subdural blood on the right on the prior CT of 03/07/2019. No acute hemorrhage Chronic infarct in the right occipital lobe which is not identified on the prior MRI. Electronically Signed   By: Franchot Gallo M.D.   On: 07/22/2019 12:04   DG Chest Portable 1 View  Result Date: 07/23/2019 CLINICAL DATA:  Fall, shortness of breath EXAM: PORTABLE CHEST 1 VIEW COMPARISON:  07/21/2019 FINDINGS: Cardiomegaly. Mild vascular congestion. No confluent opacities or effusions. No acute bony abnormality or pneumothorax. Loop recorder noted over the left chest. IMPRESSION: Cardiomegaly, vascular congestion. Electronically Signed   By: Rolm Baptise M.D.   On: 07/23/2019 18:46   DG Chest Port 1 View  Result Date: 07/21/2019 CLINICAL DATA:  Near syncope, dizziness EXAM: PORTABLE CHEST 1 VIEW COMPARISON:  03/07/2019 FINDINGS: Single frontal view of the chest demonstrates loop recorder overlying cardiac apex. The cardiac silhouette remains enlarged. No airspace disease, effusion, or pneumothorax. No acute bony abnormalities. IMPRESSION: 1. Stable enlarged cardiac silhouette. 2. No acute intrathoracic process. Electronically Signed   By: Randa Ngo M.D.   On: 07/21/2019 22:59   VAS [place] LOWER EXTREMITY VENOUS (DVT) (ONLY [facility] & [facility] 7a-7p)  Result Date: 07/23/2019  Lower Venous DVTStudy Indications: Edema.  Limitations: Body habitus and poor ultrasound/tissue interface. Comparison Study: 03/06/19 previous Performing Technologist: Abram Sander RVS  Examination Guidelines: A complete evaluation includes B-mode imaging, spectral Doppler, color Doppler, and power Doppler as  needed of all accessible portions of each vessel. Bilateral testing is considered an integral part of a complete examination. Limited examinations for reoccurring indications may be performed as noted. The reflux portion of the exam is performed with the patient in reverse Trendelenburg.  +---------+---------------+---------+-----------+----------+------------------+ RIGHT    CompressibilityPhasicitySpontaneityPropertiesThrombus Aging     +---------+---------------+---------+-----------+----------+------------------+ CFV      Full  Yes      Yes                                     +---------+---------------+---------+-----------+----------+------------------+ SFJ      Full                                                            +---------+---------------+---------+-----------+----------+------------------+ FV Prox  Full                                                            +---------+---------------+---------+-----------+----------+------------------+ FV Mid                  Yes      Yes                                     +---------+---------------+---------+-----------+----------+------------------+ FV Distal                                             Not visualized     +---------+---------------+---------+-----------+----------+------------------+ PFV      Full                                                            +---------+---------------+---------+-----------+----------+------------------+ POP      Full           Yes      Yes                                     +---------+---------------+---------+-----------+----------+------------------+ PTV      Full                                         limited                                                                  visualization      +---------+---------------+---------+-----------+----------+------------------+ PERO     Full                                          limited  visualization      +---------+---------------+---------+-----------+----------+------------------+   +---------+---------------+---------+-----------+----------+------------------+ LEFT     CompressibilityPhasicitySpontaneityPropertiesThrombus Aging     +---------+---------------+---------+-----------+----------+------------------+ CFV      Full           Yes      Yes                                     +---------+---------------+---------+-----------+----------+------------------+ SFJ      Full                                                            +---------+---------------+---------+-----------+----------+------------------+ FV Prox  Full                                                            +---------+---------------+---------+-----------+----------+------------------+ FV Mid                  Yes      Yes                                     +---------+---------------+---------+-----------+----------+------------------+ FV Distal                                             Not visualized     +---------+---------------+---------+-----------+----------+------------------+ PFV      Full                                                            +---------+---------------+---------+-----------+----------+------------------+ POP      Full           Yes      Yes                                     +---------+---------------+---------+-----------+----------+------------------+ PTV      Full                                         limited                                                                  visualization      +---------+---------------+---------+-----------+----------+------------------+ PERO     Full  limited                                                                  visualization       +---------+---------------+---------+-----------+----------+------------------+     Summary: BILATERAL: -No evidence of popliteal cyst, bilaterally. RIGHT: - There is no evidence of deep vein thrombosis in the lower extremity. However, portions of this examination were limited- see technologist comments above.  LEFT: - There is no evidence of deep vein thrombosis in the lower extremity. However, portions of this examination were limited- see technologist comments above.  *See table(s) above for measurements and observations.    Preliminary     EKG: Independently reviewed. Sinus or ectopic atrial rhythm, PACs.   Assessment/Plan   1. Acute hypoxic respiratory failure; acute on chronic diastolic CHF  - Presents with SOB and near-syncope and is found to be saturating in mid-upper 70s on rm air while at rest  - No infectious s/s; no chest pain or cough and LE venous US neg for DVT  - CXR with cardiomegaly and vascular congestion, and BNP is 3174 (much higher than priors)  - She was given Lasix 60 mg IV in ED  - Continue diuresis with Lasix 40 mg IV q12h, monitor daily wt and I/Os, closely monitor electrolytes and renal function during diuresis, update echo, and consider V/Q scan if she remains hypoxic despite diuresis    2. Near-syncope  - Patient reports recurrent episodes of near-syncope while ambulating, has loop recorder for cryptogenic CVA, was seen by cardiology for this in the hospital 07/22/19 and was noted to have sinus node dysfunction with junctional rhythm and rate in 50s  - Continue cardiac monitoring, check orthostatic vitals, update echocardiogram    3. Hyponatremia  - Serum sodium is 128 in ED, corrected to 130 when accounting for hyperglycemia  - Restrict free-water, monitor closely while diuresing    4. Acute kidney injury superimposed on CKD IV  - SCr 3.36 in ED, up from 2.83 the day before  - She appeared hypervolemic in ED and is being diuresed  - Renally-dose medications,  repeat chem panel in am    5. Seizure disorder  - Continue Tegretol    6. Elevated troponin  - HS troponin is 54 in ED without chest pain or acute ST abnormality on EKG  - Continue cardiac monitoring, check second troponin, continue ASA and statin   7. Hypertension  - Continue Norvasc and hydralazine    8. Type II DM  - A1c was only 5.3% in June 2021  - Check CBGs and use low-intensity SSI   9. Anemia - Appears stable, monitor    DVT prophylaxis: sq heparin  Code Status: Full  Family Communication: Discussed with patient  Disposition Plan:  Patient is from: Home  Anticipated d/c is to: TBD Anticipated d/c date is: 07/27/19 Patient currently: Has new supplemental O2 requirement, pending improvement with diuresis or V/Q scan if does not improve with diuresis  Consults called: None  Admission status: Inpatient     Vianne Bulls, MD [facility] Pager: See [email or web address]  If 7AM-7PM, please contact the daytime attending [email or web address]  07/23/2019, 8:48 PM

## 2019-07-23 NOTE — ED Triage Notes (Signed)
Patient arrived from home via EMS; reported patient fell d/t leg weakness. Denies LOC, and head injury. EMS endorsed initial Sp02 was low 70s on RA ; applied supplemental 02 and Sp02 improved to high 90s. EMS endorsed patient was recently seen in ED for bradycardia.

## 2019-07-24 ENCOUNTER — Inpatient Hospital Stay (HOSPITAL_COMMUNITY): Payer: Medicare Other

## 2019-07-24 ENCOUNTER — Encounter (HOSPITAL_COMMUNITY): Payer: Self-pay | Admitting: Family Medicine

## 2019-07-24 DIAGNOSIS — R55 Syncope and collapse: Secondary | ICD-10-CM

## 2019-07-24 DIAGNOSIS — R0989 Other specified symptoms and signs involving the circulatory and respiratory systems: Secondary | ICD-10-CM

## 2019-07-24 DIAGNOSIS — R001 Bradycardia, unspecified: Secondary | ICD-10-CM

## 2019-07-24 DIAGNOSIS — I10 Essential (primary) hypertension: Secondary | ICD-10-CM

## 2019-07-24 DIAGNOSIS — I498 Other specified cardiac arrhythmias: Secondary | ICD-10-CM

## 2019-07-24 DIAGNOSIS — J9601 Acute respiratory failure with hypoxia: Secondary | ICD-10-CM

## 2019-07-24 DIAGNOSIS — I5031 Acute diastolic (congestive) heart failure: Secondary | ICD-10-CM

## 2019-07-24 LAB — CBG MONITORING, ED
Glucose-Capillary: 105 mg/dL — ABNORMAL HIGH (ref 70–99)
Glucose-Capillary: 147 mg/dL — ABNORMAL HIGH (ref 70–99)

## 2019-07-24 LAB — GLUCOSE, CAPILLARY
Glucose-Capillary: 172 mg/dL — ABNORMAL HIGH (ref 70–99)
Glucose-Capillary: 104 mg/dL — ABNORMAL HIGH (ref 70–99)

## 2019-07-24 LAB — ECHOCARDIOGRAM COMPLETE
Height: 62 in
Weight: 3756.64 oz

## 2019-07-24 LAB — TROPONIN I (HIGH SENSITIVITY)
Troponin I (High Sensitivity): 101 ng/L (ref <18)
Troponin I (High Sensitivity): 106 ng/L (ref <18)
Troponin I (High Sensitivity): 96 ng/L — ABNORMAL HIGH (ref <18)

## 2019-07-24 MED ORDER — INSULIN ASPART 100 UNIT/ML ~~LOC~~ SOLN
0.0000 [IU] | Freq: Every day | SUBCUTANEOUS | Status: DC
  Administered 2019-07-26: 2 [IU] via SUBCUTANEOUS

## 2019-07-24 MED ORDER — MIRTAZAPINE 15 MG PO TABS
15.0000 mg | ORAL_TABLET | Freq: Every day | ORAL | Status: DC
  Administered 2019-07-24 – 2019-07-26 (×4): 15 mg via ORAL
  Filled 2019-07-24 (×5): qty 1

## 2019-07-24 MED ORDER — FUROSEMIDE 10 MG/ML IJ SOLN
40.0000 mg | Freq: Two times a day (BID) | INTRAMUSCULAR | Status: DC
  Administered 2019-07-24 – 2019-07-25 (×2): 40 mg via INTRAVENOUS
  Filled 2019-07-24 (×3): qty 4

## 2019-07-24 MED ORDER — CITALOPRAM HYDROBROMIDE 20 MG PO TABS
20.0000 mg | ORAL_TABLET | Freq: Every day | ORAL | Status: DC
  Administered 2019-07-24 – 2019-07-27 (×4): 20 mg via ORAL
  Filled 2019-07-24 (×3): qty 1
  Filled 2019-07-24: qty 2

## 2019-07-24 MED ORDER — PANTOPRAZOLE SODIUM 40 MG PO TBEC
40.0000 mg | DELAYED_RELEASE_TABLET | Freq: Every day | ORAL | Status: DC
  Administered 2019-07-24 – 2019-07-27 (×4): 40 mg via ORAL
  Filled 2019-07-24 (×4): qty 1

## 2019-07-24 MED ORDER — HYDRALAZINE HCL 25 MG PO TABS
25.0000 mg | ORAL_TABLET | Freq: Every day | ORAL | Status: DC
  Administered 2019-07-25 – 2019-07-27 (×3): 25 mg via ORAL
  Filled 2019-07-24 (×3): qty 1

## 2019-07-24 MED ORDER — SODIUM CHLORIDE 0.9% FLUSH
3.0000 mL | Freq: Two times a day (BID) | INTRAVENOUS | Status: DC
  Administered 2019-07-24 – 2019-07-26 (×4): 3 mL via INTRAVENOUS

## 2019-07-24 MED ORDER — LOPERAMIDE HCL 2 MG PO CAPS
2.0000 mg | ORAL_CAPSULE | Freq: Once | ORAL | Status: AC
  Administered 2019-07-25: 2 mg via ORAL
  Filled 2019-07-24: qty 1

## 2019-07-24 MED ORDER — SODIUM CHLORIDE 0.9% FLUSH: 3.0000 mL | INTRAVENOUS | Status: DC | PRN

## 2019-07-24 MED ORDER — EZETIMIBE-SIMVASTATIN 10-40 MG PO TABS
1.0000 | ORAL_TABLET | Freq: Every day | ORAL | Status: DC
  Filled 2019-07-24: qty 1

## 2019-07-24 MED ORDER — ACETAMINOPHEN 325 MG PO TABS: 650.0000 mg | ORAL_TABLET | ORAL | Status: DC | PRN

## 2019-07-24 MED ORDER — HEPARIN SODIUM (PORCINE) 5000 UNIT/ML IJ SOLN
5000.0000 [IU] | Freq: Three times a day (TID) | INTRAMUSCULAR | Status: DC
  Administered 2019-07-24 – 2019-07-27 (×10): 5000 [IU] via SUBCUTANEOUS
  Filled 2019-07-24 (×10): qty 1

## 2019-07-24 MED ORDER — INSULIN ASPART 100 UNIT/ML ~~LOC~~ SOLN
0.0000 [IU] | Freq: Three times a day (TID) | SUBCUTANEOUS | Status: DC
  Administered 2019-07-24 – 2019-07-25 (×3): 1 [IU] via SUBCUTANEOUS
  Administered 2019-07-26: 2 [IU] via SUBCUTANEOUS
  Administered 2019-07-26: 1 [IU] via SUBCUTANEOUS
  Administered 2019-07-26: 2 [IU] via SUBCUTANEOUS
  Administered 2019-07-27: 3 [IU] via SUBCUTANEOUS

## 2019-07-24 MED ORDER — AMLODIPINE BESYLATE 10 MG PO TABS
10.0000 mg | ORAL_TABLET | Freq: Every day | ORAL | Status: DC
  Administered 2019-07-25: 10 mg via ORAL
  Filled 2019-07-24: qty 1
  Filled 2019-07-24: qty 2

## 2019-07-24 MED ORDER — ASPIRIN 81 MG PO CHEW
81.0000 mg | CHEWABLE_TABLET | Freq: Every day | ORAL | Status: DC
  Administered 2019-07-24 – 2019-07-27 (×4): 81 mg via ORAL
  Filled 2019-07-24 (×4): qty 1

## 2019-07-24 MED ORDER — SODIUM CHLORIDE 0.9 % IV SOLN: 250.0000 mL | INTRAVENOUS | Status: DC | PRN

## 2019-07-24 MED ORDER — CARBAMAZEPINE 200 MG PO TABS
400.0000 mg | ORAL_TABLET | Freq: Two times a day (BID) | ORAL | Status: DC
  Administered 2019-07-24 – 2019-07-27 (×7): 400 mg via ORAL
  Filled 2019-07-24 (×8): qty 2

## 2019-07-24 MED ORDER — ONDANSETRON HCL 4 MG/2ML IJ SOLN
4.0000 mg | Freq: Four times a day (QID) | INTRAMUSCULAR | Status: DC | PRN
  Administered 2019-07-26: 4 mg via INTRAVENOUS
  Filled 2019-07-24: qty 2

## 2019-07-24 NOTE — Discharge Planning (Signed)
EDCM following for disposition needs.  PT order placed for evaluation. [name] J. Clydene Laming, [place], [place], [place]

## 2019-07-24 NOTE — ED Notes (Signed)
Report given to 3E RN. All questions answered 

## 2019-07-24 NOTE — Consult Note (Addendum)
Cardiology Consultation:   Patient ID: [name] MRN: [id number]; DOB: [birth year]  Admit date: 07/23/2019 Date of Consult: 07/24/2019  Primary Care Provider: Benito Mccreedy, MD [facility] Cardiologist: Paula Chandler, MD seen once in 2016 Saw Dr. Sallyanne Martin in 2021  [place] Electrophysiologist:  Paula Grayer, MD    Patient Profile:   [name] is a 74 y.o. female with a Martin of hypertensive heart disease, chronic diastolic heart failure, chronic kidney disease stage IV, intermittent junctional bradycardia, hyperlipidemia, diabetes mellitus, cryptogenic stroke with SDH 02/2019 of unknown etiology s/p implantable loop recorder 05/2019, seizure disorder, morbid obesity, recent bradycardia who is being seen today for the evaluation of CHF at the request of Dr. Earnest Martin.  History of Present Illness:   [name] and recent discharge 07/22/19. She has a Martin of junct. Loletha Martin with renal failure in 2016, also with Lt brachial embolectomy at that time and treated with coumadin.  Recurrent junctional brady in 2017,  in 02/2019 with acute CHF and CVA with M1 occlusion- had successful mechanical thrombectomy 03/07/19.  Also + for Rt sided SDH on ASA.  At that time SR with intermittent junctional brady. ILR placement 06/06/19 and no arrhythmias on device check 07/09/19.   Seen by cardiology 07/22/19 for near syncope, + loss of balance.  EF has been 65-70%. ECG 07/22/19 with severe sinus bradycardia and a junctional escape rhythm. Rhythm is irregular due to occasional captured sinus P waves. Similar findings on loop recorder interrogation 07/22/19 : 2 episodes of 2-minute "Afib" are irregular rhythms due to occasional capture P waves. No true AF is see. No severe bradycardia or pauses have been recorded. No arrhythmia was seen at the time of her fall.   Pt presented to ER by EMS 07/23/19 with fall due to leg weakness.  No LOC.  sp02 on EMS arrival was 70% on RA with 02 up to the  90s.    No chest pain an no significant SOB.  She was walking to bathroom and becomes very weak and goes to the floor.  She remembers falling and no injury.  No loss of bladder or bowel control.    EKG:  The EKG was personally reviewed and demonstrates:  Similar to last admit with Junctional and PAC Vs a fib, but different from EKGs in Feb with SR.   Telemetry:  Telemetry was personally reviewed and demonstrates:  Now SR some junctional.    Na 128, Martin+ 5.3, glucose 239, BUN 63, Cr 3.36 Mg+ 2.2 LFTs WNL ( Cr 2.83  Ib 6/27  BNP 3173 Hs Troponin 54, 82, 101, 106 Hgb 8.9 WBC 11.9 and plts 254  PCXR IMPRESSION: Cardiomegaly, vascular congestion. echo stable.     Venous doppler neg for DVT, though portions of exam limitied.   CT of head IMPRESSION: Small chronic subdural hematoma on the right. There was acute subdural blood on the right on the prior CT of 03/07/2019. No acute hemorrhage  Chronic infarct in the right occipital lobe which is not identified on the prior MRI.  Currently no complaints, eating lunch, does need her teeth.  She had not taken her lasix yesterday    BP 106/58 P 71 to 64   BP lying 145/99 with pulse 65 and sitting 140/89 with HR 70   Past Medical History:  Diagnosis Date  . 1st degree AV block 05/02/2017  . Anemia   . Arterial embolism and thrombosis of upper extremity ([place])   . Chronic diastolic (  congestive) heart failure ([place])    a. 04/2017 Echo: EF 60-65%. Gr1 DD. Mod LVH.   . CKD (chronic kidney disease) stage 4, GFR 15-29 ml/min (H[facility]) 05/02/2017  . Depression   . Diabetes mellitus type 2, insulin dependent ([place]) 05/02/2017  . DVT (deep venous thrombosis) ([place])   . Dyslipidemia 05/02/2017  . Essential hypertension   . GERD (gastroesophageal reflux disease)   . Hyperlipidemia   . Junctional bradycardia   . Morbid obesity with BMI of 45.0-49.9, adult ([place]) 05/02/2017  . OSA (obstructive sleep apnea) 10/17/2012  . Rhabdomyolysis 09/25/2012  . Seizures ([place])     "epilepsy; they think I had one a couple months ago" (12/11/2014)  . Symptomatic bradycardia 11/27/2016    Past Surgical History:  Procedure Laterality Date  . implantable loop recorder  06/06/2019    Medtronic Reveal Linq model LNQ 11 SN [id number] S implantable loop recorder implanted in office by Dr [name] for cryptogenic stroke  . IR CT HEAD LTD  03/07/2019  . IR PERCUTANEOUS ART THROMBECTOMY/INFUSION INTRACRANIAL INC DIAG ANGIO  03/07/2019  . RADIOLOGY WITH ANESTHESIA N/A 03/07/2019   Procedure: IR WITH ANESTHESIA;  Surgeon: Radiologist, Medication, MD;  Location: [facility];  Service: Radiology;  Laterality: N/A;  . THROMBECTOMY BRACHIAL ARTERY Left 12/15/2014   Procedure: THROMBECTOMY BRACHIAL ARTERY; VEIN PATCH ANGIOPLASTY LEFT BRACIAL ARTERY;  Surgeon: Angelia Mould, MD;  Location: [facility];  Service: Vascular;  Laterality: Left;  . TUBAL LIGATION       Home Medications:  Prior to Admission medications   Medication Sig Start Date End Date Taking? Authorizing Provider  amLODipine (NORVASC) 10 MG tablet Take 10 mg by mouth daily. 05/05/19  Yes [provider]  aspirin 81 MG chewable tablet Chew 1 tablet (81 mg total) by mouth daily. 12/21/14  Yes Paula, Ripudeep K, MD  carbamazepine (TEGRETOL) 200 MG tablet Take 400 mg by mouth 2 (two) times daily.    Yes [provider]  cetirizine (ZYRTEC) 10 MG tablet Take 10 mg by mouth daily. 07/19/19  Yes [provider]  citalopram (CELEXA) 20 MG tablet Take 20 mg by mouth daily.   Yes [provider]  ezetimibe-simvastatin (VYTORIN) 10-40 MG per tablet Take 1 tablet by mouth daily.   Yes [provider]  glipiZIDE (GLUCOTROL XL) 5 MG 24 hr tablet Take 5 mg by mouth daily with breakfast.  03/24/18  Yes [provider]  HUMULIN 70/30 KWIKPEN (70-30) 100 UNIT/ML PEN Inject 5 Units into the skin 2 (two) times daily. 05/06/17  Yes Paula August, MD  hydrALAZINE (APRESOLINE) 25 MG tablet Take 25 mg by  mouth daily.  05/13/19  Yes [provider]  omeprazole (PRILOSEC) 40 MG capsule Take 40 mg by mouth daily.   Yes [provider]  Vitamin D, Ergocalciferol, (DRISDOL) 1.25 MG (50000 UNIT) CAPS capsule Take 50,000 Units by mouth once a week. 07/09/19  Yes [provider]  ACCU-CHEK AVIVA PLUS test strip 3 (three) times daily. for testing 04/14/18   [provider]  Accu-Chek Softclix Lancets lancets See admin instructions. 12/29/17   [provider]  Ascorbic Acid (VITAMIN C PO) Take 1 tablet by mouth daily.    [provider]  B-D ULTRAFINE III SHORT PEN 31G X 8 MM MISC See admin instructions. 04/18/18   [provider]  mirtazapine (REMERON) 15 MG tablet Take 15 mg by mouth at bedtime.  05/06/18   [provider]    Inpatient Medications: Scheduled Meds: .  amLODipine  10 mg Oral Daily  . aspirin  81 mg Oral Daily  . carbamazepine  400 mg Oral BID  . citalopram  20 mg Oral Daily  . ezetimibe-simvastatin  1 tablet Oral Daily  . furosemide  40 mg Intravenous Q12H  . heparin  5,000 Units Subcutaneous Q8H  . hydrALAZINE  25 mg Oral Daily  . insulin aspart  0-5 Units Subcutaneous QHS  . insulin aspart  0-9 Units Subcutaneous TID WC  . mirtazapine  15 mg Oral QHS  . pantoprazole  40 mg Oral Daily  . sodium chloride flush  3 mL Intravenous Q12H   Continuous Infusions: . sodium chloride     PRN Meds: sodium chloride, acetaminophen, ondansetron (ZOFRAN) IV, sodium chloride flush  Allergies:    Allergies  Allergen Reactions  . Codeine Nausea And Vomiting  . Metoprolol Other (See Comments)    Severe bradycardia.  Now getting admitted for 3rd time in 3 years for bradycardia onset after being started on toprol.  Please dont put patient on this med anymore!    Social History:   Social History   Socioeconomic History  . Marital status: Divorced    Spouse name: Not on file  . Number of children: 2  . Years of education:  10  . Highest education level: Not on file  Occupational History  . Occupation: Retired  Tobacco Use  . Smoking status: Never Smoker  . Smokeless tobacco: Never Used  . Tobacco comment: EXPOSED TO 2ND HAND SMOKE X 20+ YEARS.   Vaping Use  . Vaping Use: Never used  Substance and Sexual Activity  . Alcohol use: No  . Drug use: No  . Sexual activity: Never  Other Topics Concern  . Not on file  Social History Narrative   Lives in [place] by herself.  Daughters nearby.  Sedentary.   Eats fast food for at least one meal almost daily.   Caffeine use:  Drinks soda daily.   Coffee rarely.    Social Determinants of Health   Financial Resource Strain:   . Difficulty of Paying Living Expenses:   Food Insecurity:   . Worried About Charity fundraiser in the Last Year:   . Arboriculturist in the Last Year:   Transportation Needs:   . Film/video editor (Medical):   [name] Lack of Transportation (Non-Medical):   Physical Activity:   . Days of Exercise per Week:   . Minutes of Exercise per Session:   Stress:   . Feeling of Stress :   Social Connections:   . Frequency of Communication with Friends and Family:   . Frequency of Social Gatherings with Friends and Family:   . Attends Religious Services:   . Active Member of Clubs or Organizations:   . Attends Archivist Meetings:   [name] Marital Status:   Intimate Partner Violence:   . Fear of Current or Ex-Partner:   . Emotionally Abused:   [name] Physically Abused:   . Sexually Abused:     Family History:    Family History  Problem Relation Age of Onset  . Hypertension Mother        died @ 55 - unknown cause.  [name] Hypertension Father        says she doesn't know anything about her father.  . Cancer Maternal Aunt   . Seizures Neg Martin      ROS:  Please see the history of present illness.  General:no colds  or fevers, no weight changes Skin:no rashes or ulcers HEENT:no blurred vision, no congestion CV:see HPI PUL:see HPI GI:no  diarrhea constipation or melena, no indigestion GU:no hematuria, no dysuria MS:no joint pain, no claudication Neuro:no syncope, no lightheadedness, + weakness, Martin of seizures Endo:+ diabetes, no thyroid disease  All other ROS reviewed and negative.     Physical Exam/Data:   Vitals:   07/24/19 0626 07/24/19 0730 07/24/19 0828 07/24/19 0858  BP: 128/89 107/80 (!) 119/102 (!) 106/58  Pulse: 65 71 71   Resp: 16 16 16 15   Temp:      TempSrc:      SpO2: 100% 100% 100%   Weight:      Height:        Intake/Output Summary (Last 24 hours) at 07/24/2019 1006 Last data filed at 07/24/2019 0406 Gross per 24 hour  Intake --  Output 600 ml  Net -600 ml   Last 3 Weights 07/23/2019 07/22/2019 06/06/2019  Weight (lbs) 234 lb 12.6 oz 234 lb 12.6 oz 234 lb 12.8 oz  Weight (kg) 106.5 kg 106.5 kg 106.505 kg     Body mass index is 42.94 kg/m.  General:  Well nourished, well developed, in no acute distress eating breakfast HEENT: normal Lymph: no adenopathy Neck: no JVD sitting up Endocrine:  No thryomegaly Vascular: No carotid bruits; pedal pulses 2+ bilaterally  Cardiac:  normal S1, S2; RRR; no murmur gallup rub or click Lungs:  Decreased breath sounds to auscultation bilaterally, no wheezing, rhonchi or rales  Abd: soft, nontender, no hepatomegaly  Ext: no edema Musculoskeletal:  No deformities, BUE and BLE strength normal and equal Skin: warm and dry  Neuro:  Alert and oriented X 3 MAE follows commands no focal abnormalities noted Psych:  Normal affect   Relevant CV Studies: ECHO 6/289/21   IMPRESSIONS    1. Left ventricular ejection fraction, by estimation, is 60 to 65%. The  left ventricle has normal function. The left ventricle has no regional  wall motion abnormalities. There is mild concentric left ventricular  hypertrophy. Left ventricular diastolic  parameters are consistent with Grade I diastolic dysfunction (impaired  relaxation). Elevated left atrial pressure.  2.  Right ventricular systolic function is mildly reduced. The right  ventricular size is mildly enlarged. Mildly increased right ventricular  wall thickness. There is mildly elevated pulmonary artery systolic  pressure.  3. Left atrial size was moderately dilated.  4. Right atrial size was mildly dilated.  5. The mitral valve is normal in structure. No evidence of mitral valve  regurgitation. No evidence of mitral stenosis.  6. The aortic valve is normal in structure. Aortic valve regurgitation is  not visualized. No aortic stenosis is present.  7. The inferior vena cava is normal in size with greater than 50%  respiratory variability, suggesting right atrial pressure of 3 mmHg.   FINDINGS  Left Ventricle: Left ventricular ejection fraction, by estimation, is 60  to 65%. The left ventricle has normal function. The left ventricle has no  regional wall motion abnormalities. The left ventricular internal cavity  size was normal in size. There is  mild concentric left ventricular hypertrophy. Left ventricular diastolic  parameters are consistent with Grade I diastolic dysfunction (impaired  relaxation). Elevated left atrial pressure.   Right Ventricle: The right ventricular size is mildly enlarged. Mildly  increased right ventricular wall thickness. Right ventricular systolic  function is mildly reduced. There is mildly elevated pulmonary artery  systolic pressure. The tricuspid  regurgitant velocity is  2.76 m/s, and with an assumed right atrial  pressure of 3 mmHg, the estimated right ventricular systolic pressure is  15.1 mmHg.   Left Atrium: Left atrial size was moderately dilated.   Right Atrium: Right atrial size was mildly dilated.   Pericardium: There is no evidence of pericardial effusion.   Mitral Valve: The mitral valve is normal in structure. Normal mobility of  the mitral valve leaflets. No evidence of mitral valve regurgitation. No  evidence of mitral valve  stenosis.   Tricuspid Valve: The tricuspid valve is normal in structure. Tricuspid  valve regurgitation is not demonstrated. No evidence of tricuspid  stenosis.   Aortic Valve: The aortic valve is normal in structure. Aortic valve  regurgitation is not visualized. No aortic stenosis is present.   Pulmonic Valve: The pulmonic valve was normal in structure. Pulmonic valve  regurgitation is not visualized. No evidence of pulmonic stenosis.   Aorta: The aortic root is normal in size and structure.   Venous: The inferior vena cava is normal in size with greater than 50%  respiratory variability, suggesting right atrial pressure of 3 mmHg.   IAS/Shunts: No atrial level shunt detected by color flow Doppler.     LEFT VENTRICLE  PLAX 2D  LVIDd:     3.90 cm Diastology  LVIDs:     2.80 cm LV e' lateral:  6.00 cm/s  LV PW:     1.20 cm LV E/e' lateral: 14.8  LVOT diam:   2.10 cm LV e' medial:  6.09 cm/s  LV SV:     67    LV E/e' medial: 14.6  LV SV Index:  33  LVOT Area:   3.46 cm    Echo 03/07/19 IMPRESSIONS    1. Left ventricular ejection fraction, by estimation, is 65 to 70%. The  left ventricle has normal function. The left ventrical has no regional  wall motion abnormalities. Left ventricular diastolic function could not  be evaluated. LV filling pressures  not assessed. No evidence of LV apical thrombus by definity contrast.  2. Right ventricular systolic function was not well visualized. The right  ventricular size is not well visualized.  3. The mitral valve is normal in structure and function.  4. The aortic valve was not assessed.   FINDINGS  Left Ventricle: Left ventricular ejection fraction, by estimation, is 65  to 70%. The left ventricle has normal function. The left ventricle has no  regional wall motion abnormalities. The left ventricular internal cavity  size was normal in size. There is  no left ventricular hypertrophy. LV  filling pressures not assessed. o  evidence of LV apical thrombus by definity contrast   Right Ventricle: The right ventricular size is not well visualized. Right  vetricular wall thickness was not assessed. Right ventricular systolic  function was not well visualized.   Left Atrium: Left atrial size was not assessed.   Right Atrium: Right atrial size was not assessed.   Pericardium: Trivial pericardial effusion is present. The pericardial  effusion is posterior to the left ventricle.   Mitral Valve: The mitral valve is normal in structure and function. Normal  mobility of the mitral valve leaflets.   Tricuspid Valve: The tricuspid valve is not assessed.   Aortic Valve: The aortic valve was not assessed.   Pulmonic Valve: The pulmonic valve was not assessed.   Aorta: Aortic root could not be assessed.   Venous: The inferior vena cava was not well visualized. The inferior vena  cava  and the hepatic vein show a normal flow pattern.   IAS/Shunts: No atrial level shunt detected by color flow Doppler.   Laboratory Data:  High Sensitivity Troponin:   Recent Labs  Lab 07/21/19 2316 07/23/19 1834 07/23/19 2148 07/24/19 0449 07/24/19 0834  TROPONINIHS 61* 54* 82* 101* 106*     Chemistry Recent Labs  Lab 07/21/19 2316 07/22/19 0519 07/23/19 1834  NA 124* 130* 128*  Martin 4.2 4.5 5.3*  CL 93* 97* 94*  CO2 20* 23 19*  GLUCOSE 356* 100* 239*  BUN 53* 51* 63*  CREATININE 2.97* 2.83* 3.36*  CALCIUM 8.3* 8.7* 8.9  GFRNONAA 15* 16* 13*  GFRAA 17* 19* 15*  ANIONGAP 11 10 15     Recent Labs  Lab 07/23/19 1834  PROT 7.3  ALBUMIN 3.6  AST 18  ALT 17  ALKPHOS 89  BILITOT 0.9   Hematology Recent Labs  Lab 07/21/19 2316 07/23/19 1834  WBC 5.5 11.6*  RBC 2.85* 3.09*  HGB 8.2* 8.9*  HCT 24.6* 26.8*  MCV 86.3 86.7  MCH 28.8 28.8  MCHC 33.3 33.2  RDW 13.8 13.7  PLT 235 254   BNP Recent Labs  Lab 07/21/19 2316 07/23/19 1834  BNP 796.1* 3,173.5*    DDimer No  results for input(s): DDIMER in the last 168 hours.   Radiology/Studies:  CT HEAD WO CONTRAST  Result Date: 07/22/2019 CLINICAL DATA:  Syncope. EXAM: CT HEAD WITHOUT CONTRAST TECHNIQUE: Contiguous axial images were obtained from the base of the skull through the vertex without intravenous contrast. COMPARISON:  CT head 03/07/2019.  MRI head 03/08/2019 FINDINGS: Brain: Small low-density extra-axial fluid collection on the right compatible with chronic subdural hematoma. Acute subdural hematoma on the right was seen on the prior CT. Negative for acute hemorrhage. Generalized atrophy with chronic microvascular ischemic changes in the white matter. Hypodensity in the right occipital lobe is new but appears to represent a nonacute infarct. No midline shift. Vascular: Negative for hyperdense vessel. Skull: No focal skeletal lesion. Sinuses/Orbits: Paranasal sinuses clear.  Negative orbit Other: None IMPRESSION: Small chronic subdural hematoma on the right. There was acute subdural blood on the right on the prior CT of 03/07/2019. No acute hemorrhage Chronic infarct in the right occipital lobe which is not identified on the prior MRI. Electronically Signed   By: Franchot Gallo M.D.   On: 07/22/2019 12:04   DG Chest Portable 1 View  Result Date: 07/23/2019 CLINICAL DATA:  Fall, shortness of breath EXAM: PORTABLE CHEST 1 VIEW COMPARISON:  07/21/2019 FINDINGS: Cardiomegaly. Mild vascular congestion. No confluent opacities or effusions. No acute bony abnormality or pneumothorax. Loop recorder noted over the left chest. IMPRESSION: Cardiomegaly, vascular congestion. Electronically Signed   By: Rolm Baptise M.D.   On: 07/23/2019 18:46   DG Chest Port 1 View  Result Date: 07/21/2019 CLINICAL DATA:  Near syncope, dizziness EXAM: PORTABLE CHEST 1 VIEW COMPARISON:  03/07/2019 FINDINGS: Single frontal view of the chest demonstrates loop recorder overlying cardiac apex. The cardiac silhouette remains enlarged. No  airspace disease, effusion, or pneumothorax. No acute bony abnormalities. IMPRESSION: 1. Stable enlarged cardiac silhouette. 2. No acute intrathoracic process. Electronically Signed   By: Randa Ngo M.D.   On: 07/21/2019 22:59   VAS [place] LOWER EXTREMITY VENOUS (DVT) (ONLY [facility] & [facility] 7a-7p)  Result Date: 07/24/2019  Lower Venous DVTStudy Indications: Edema.  Limitations: Body habitus and poor ultrasound/tissue interface. Comparison Study: 03/06/19 previous Performing Technologist: Abram Sander RVS  Examination Guidelines: A complete evaluation includes  B-mode imaging, spectral Doppler, color Doppler, and power Doppler as needed of all accessible portions of each vessel. Bilateral testing is considered an integral part of a complete examination. Limited examinations for reoccurring indications may be performed as noted. The reflux portion of the exam is performed with the patient in reverse Trendelenburg.  +---------+---------------+---------+-----------+----------+------------------+ RIGHT    CompressibilityPhasicitySpontaneityPropertiesThrombus Aging     +---------+---------------+---------+-----------+----------+------------------+ CFV      Full           Yes      Yes                                     +---------+---------------+---------+-----------+----------+------------------+ SFJ      Full                                                            +---------+---------------+---------+-----------+----------+------------------+ FV Prox  Full                                                            +---------+---------------+---------+-----------+----------+------------------+ FV Mid                  Yes      Yes                                     +---------+---------------+---------+-----------+----------+------------------+ FV Distal                                             Not visualized      +---------+---------------+---------+-----------+----------+------------------+ PFV      Full                                                            +---------+---------------+---------+-----------+----------+------------------+ POP      Full           Yes      Yes                                     +---------+---------------+---------+-----------+----------+------------------+ PTV      Full                                         limited  visualization      +---------+---------------+---------+-----------+----------+------------------+ PERO     Full                                         limited                                                                  visualization      +---------+---------------+---------+-----------+----------+------------------+   +---------+---------------+---------+-----------+----------+------------------+ LEFT     CompressibilityPhasicitySpontaneityPropertiesThrombus Aging     +---------+---------------+---------+-----------+----------+------------------+ CFV      Full           Yes      Yes                                     +---------+---------------+---------+-----------+----------+------------------+ SFJ      Full                                                            +---------+---------------+---------+-----------+----------+------------------+ FV Prox  Full                                                            +---------+---------------+---------+-----------+----------+------------------+ FV Mid                  Yes      Yes                                     +---------+---------------+---------+-----------+----------+------------------+ FV Distal                                             Not visualized     +---------+---------------+---------+-----------+----------+------------------+ PFV      Full                                                             +---------+---------------+---------+-----------+----------+------------------+ POP      Full           Yes      Yes                                     +---------+---------------+---------+-----------+----------+------------------+ PTV      Full  limited                                                                  visualization      +---------+---------------+---------+-----------+----------+------------------+ PERO     Full                                         limited                                                                  visualization      +---------+---------------+---------+-----------+----------+------------------+     Summary: BILATERAL: -No evidence of popliteal cyst, bilaterally. RIGHT: - There is no evidence of deep vein thrombosis in the lower extremity. However, portions of this examination were limited- see technologist comments above.  LEFT: - There is no evidence of deep vein thrombosis in the lower extremity. However, portions of this examination were limited- see technologist comments above.  *See table(s) above for measurements and observations. Electronically signed by Deitra Mayo MD on 07/24/2019 at 8:50:12 AM.    Final         NO CHEST PAIN   Assessment and Plan:   1. Acute diastolic HF with elevated BNP and CXR with cardiomegaly and vascular congestion.  Has rec'd lasix 60 mg IV Neg 600 cc now 40 IV lasix BID.   Echo today with EF 60-65% no RWMA, mild LVH, G1DD, RV function mildly reduced RV is mildly enlarged  LA moderately dilated and RA mildly dilated.  No chest pain  2. Acute hypoxia improved with 02.due to CHF   3. Junctional rhythm with PACs  Has had intermittently since 2016.  No AV blocking agents-- on Sunday no arrhythmias with fall --she does have LINQ will have evaluated.  Will ask EP to see if slow HR is contributing to her  falls and heart failure.   4. Cryptogenic CVA 02/2019 and LINq placed. Also SDH and smaller on last CT head.  But no anticoagulation due to SDH she is on asa 81 mg daily.  5. Weakness leading to fall.  not orthostatic here. (? Related to prior CVA) She was hypoxic on arrival.  Agree with admit and have her walk with PT and see what happens.  She only missed one day of lasix.  6. CKD-4-5 elevated Cr today. No on diuretics as outpt - lasix was stopped on the 27th prior to that was on 40 mg daily.  May have increased CR due to volume overload.  Would ask renal to eval to assist with volume.  Fell both on and off lasix.     7. Hyponatremia --per IM on the 26th was 124.  8. HTN on hydralazine 25 mg once daily had been TID but decreased on the 27th. Today 124/107 to 106/58  9. DM-2 on insulin per IM  10. Martin OSA       For questions or updates, please contact [place] Please  consult [email or web address] for contact info under    Signed, Cecilie Kicks, NP  07/24/2019 10:06 AM   I have examined the patient and reviewed assessment and plan and discussed with patient.  Agree with above as stated.    IV Lasix for diuresis, although she does not look very volume overloaded on exam.  CXR shows vascular congestion.   Will need renal consult given significant CKD.    Intermittent junctional rhythm-? If this contributes to her weakness and if prolonged enough, her CHF?  Will have EP eval for pacer.  Larae Grooms

## 2019-07-24 NOTE — ED Notes (Signed)
[name] [phone] wants an update

## 2019-07-24 NOTE — ED Notes (Signed)
Pt is unable to stand long enough for Orthostatic Vs

## 2019-07-24 NOTE — Progress Notes (Signed)
  Echocardiogram 2D Echocardiogram has been performed.  [name] 07/24/2019, 9:34 AM

## 2019-07-24 NOTE — Consult Note (Addendum)
Cardiology Consultation:   Patient ID: [name] MRN: [id number]; DOB: Jun 02, 1945  Admit date: 07/23/2019 Date of Consult: 07/24/2019  Primary Care Provider: Benito Mccreedy, MD [facility] Cardiologist: Lauree Chandler, MD  [facility] Electrophysiologist:  Thompson Grayer, MD    Patient Profile:   [name] is a 74 y.o. female with a hx of chronic CHF, HFpEF, hypertensive heart disease, obesity, HTN, HLD, DM, CKD stage IV, stroke, seizure disorder, morbid obesity, bradycardia who is being seen today for the evaluation of bradycardia, junctional rhythm at the request of Dr. Irish Lack.  History of Present Illness:   [name] historically back November 2016 for junctional bradycardia in setting of worsening renal failure. Her clonidine and beta-blocker was discontinued.  During that admission she had developed left arm numbness and underwent left brachial embolectomy 12/15/2014. Treated with coumadin.   More recently she was admitted to [facility] back in Feb 2021 for CHF exacerbation.  Hospital course complicated by acute left ischemic stroke secondary to M1 occlusion. She underwent successful mechanical thrombectomy on 03/07/19.  She also had right sided SDH (On ASA 81mg  qd) .  During this stay she was noted to have intermittent junctional bradycardia.  Dr. Rayann Heman saw her at that time, did not think she had indication for PPM at that time and suspected when more active she may have improved sinus node recovery.  She also had hyperkalemia then.  She had outpt follow up with Dr. Rayann Heman may 2021, underwent loop implant for cryptogenic stroke, that day she had SR 7 with normal intervals.  She was admitted to [facility] 07/22/2019 after a fall, felt to have been 2/2 loss of balance on a ramp, and unable to slow her descend down the ramp and fell, no LOC was felt to have occurred though she was too weak to get up.  She was found again to have junctional rhythm, 50's with conducted P waves as  well.  Her loop was interrogated.  Dr. Vivia Ewing noted: ECG shows severe sinus bradycardia and a junctional escape rhythm. Rhythm is irregular due to occasional captured sinus P waves. Similar findings on loop recorder interrogation: 2 episodes of 2-minute "Afib" are irregular rhythms due to occasional capture P waves. No true AF is see. No severe bradycardia or pauses have been recorded. No arrhythmia was seen at the time of her fall. PLAN: No evidence of atrial fibrillation or significant bradycardia. She has sinus node dysfunction, but has a reasonable rate due to junctional rhythm. A pacemaker implantation may be necessary in the future, but this is not imminent. Will discuss w Dr. Rayann Heman, but suspect will continue with the current treatment plan She was discharged same day.  She was admitted to [facility] today via EMS 07/23/19 with fall and weakness legs.  No LOC.  sp02 on EMS arrival was 70% on RA with 02 up to the 90s.   She was noted to have junctional rhythm as well as SR Admitted with acute hypoxia, HF exacerbation, CKD (IV-V) pending nephrology eval, recurrent hyponatremia, mild hyperkalemia.  EP is asked to weigh in on her bradycardia.  The patient reports that she was fine, got up to go the bathroom, walks with a walker and by the time she got down the hall by the bathroom she was very SOB and her legs gave out on her.  She does not think she fainted.  She said she was worried she would fall again  If she tried to get up and used her life  alert to call for EMS.  She had no CP, palpitations or cardiac awareness She denies dizzy spells, near fainting or fainting. No SOB at rest. She lives alone, says she gets around with a walker, has an aid daily for help, spend most of her time sitting.  LABS Na 128 K+ 5.3 Mag 2.2 BUN/Creat 63/3.36 BNP 3173 HS Trop 54, 82, 101, 106, 96 WBC 11.6 H/H 8.9/26.8 (at/better then baseline) Plts 254  TSH (03/25/2019) 1.785  Device information MDT Linq  II, implanted 06/06/2019 for cryptogenic stroke Interrogation today No detected events  Review of her HR histograms She has some rates <60 particularly at night by one graph Her HR distribution looks 40's-80's There is a small mark< 40 though unclear of the significance of this  Pause and brady were programmed on today  Past Medical History:  Diagnosis Date  . 1st degree AV block 05/02/2017  . Anemia   . Arterial embolism and thrombosis of upper extremity ([place])   . Chronic diastolic (congestive) heart failure ([place])    a. 04/2017 Echo: EF 60-65%. Gr1 DD. Mod LVH.   . CKD (chronic kidney disease) stage 4, GFR 15-29 ml/min (HCC) 05/02/2017  . Depression   . Diabetes mellitus type 2, insulin dependent ([place]) 05/02/2017  . DVT (deep venous thrombosis) ([place])   . Dyslipidemia 05/02/2017  . Essential hypertension   . GERD (gastroesophageal reflux disease)   . Hyperlipidemia   . Junctional bradycardia   . Morbid obesity with BMI of 45.0-49.9, adult ([place]) 05/02/2017  . OSA (obstructive sleep apnea) 10/17/2012  . Rhabdomyolysis 09/25/2012  . Seizures ([place])    "epilepsy; they think I had one a couple months ago" (12/11/2014)  . Symptomatic bradycardia 11/27/2016    Past Surgical History:  Procedure Laterality Date  . implantable loop recorder  06/06/2019    Med[employer]onic Reveal Linq model LNQ 11 SN [id number] S implantable loop recorder implanted in office by Dr Rayann Heman for cryptogenic stroke  . IR CT HEAD LTD  03/07/2019  . IR PERCUTANEOUS ART THROMBECTOMY/INFUSION INTRACRANIAL INC DIAG ANGIO  03/07/2019  . RADIOLOGY WITH ANESTHESIA N/A 03/07/2019   Procedure: IR WITH ANESTHESIA;  Surgeon: Radiologist, Medication, MD;  Location: [place];  Service: Radiology;  Laterality: N/A;  . THROMBECTOMY BRACHIAL ARTERY Left 12/15/2014   Procedure: THROMBECTOMY BRACHIAL ARTERY; VEIN PATCH ANGIOPLASTY LEFT BRACIAL ARTERY;  Surgeon: Angelia Mould, MD;  Location: [place];  Service: Vascular;  Laterality: Left;  . TUBAL  LIGATION       Home Medications:  Prior to Admission medications   Medication Sig Start Date End Date Taking? Authorizing Provider  amLODipine (NORVASC) 10 MG tablet Take 10 mg by mouth daily. 05/05/19  Yes [provider]  aspirin 81 MG chewable tablet Chew 1 tablet (81 mg total) by mouth daily. 12/21/14  Yes Rai, Ripudeep K, MD  carbamazepine (TEGRETOL) 200 MG tablet Take 400 mg by mouth 2 (two) times daily.    Yes [provider]  cetirizine (ZYRTEC) 10 MG tablet Take 10 mg by mouth daily. 07/19/19  Yes [provider]  citalopram (CELEXA) 20 MG tablet Take 20 mg by mouth daily.   Yes [provider]  ezetimibe-simvastatin (VYTORIN) 10-40 MG per tablet Take 1 tablet by mouth daily.   Yes [provider]  glipiZIDE (GLUCOTROL XL) 5 MG 24 hr tablet Take 5 mg by mouth daily with breakfast.  03/24/18  Yes [provider]  HUMULIN 70/30 KWIKPEN (70-30) 100 UNIT/ML PEN Inject 5 Units into  the skin 2 (two) times daily. 05/06/17  Yes Aline August, MD  hydrALAZINE (APRESOLINE) 25 MG tablet Take 25 mg by mouth daily.  05/13/19  Yes [provider]  omeprazole (PRILOSEC) 40 MG capsule Take 40 mg by mouth daily.   Yes [provider]  Vitamin D, Ergocalciferol, (DRISDOL) 1.25 MG (50000 UNIT) CAPS capsule Take 50,000 Units by mouth once a week. 07/09/19  Yes [provider]  ACCU-CHEK AVIVA PLUS test strip 3 (three) times daily. for testing 04/14/18   [provider]  Accu-Chek Softclix Lancets lancets See admin instructions. 12/29/17   [provider]  Ascorbic Acid (VITAMIN C PO) Take 1 tablet by mouth daily.    [provider]  B-D ULTRAFINE III SHORT PEN 31G X 8 MM MISC See admin instructions. 04/18/18   [provider]  mirtazapine (REMERON) 15 MG tablet Take 15 mg by mouth at bedtime.  05/06/18   [provider]    Inpatient Medications: Scheduled Meds: . amLODipine  10 mg Oral  Daily  . aspirin  81 mg Oral Daily  . carbamazepine  400 mg Oral BID  . citalopram  20 mg Oral Daily  . ezetimibe-simvastatin  1 tablet Oral Daily  . furosemide  40 mg Intravenous Q12H  . heparin  5,000 Units Subcutaneous Q8H  . hydrALAZINE  25 mg Oral Daily  . insulin aspart  0-5 Units Subcutaneous QHS  . insulin aspart  0-9 Units Subcutaneous TID WC  . mirtazapine  15 mg Oral QHS  . pantoprazole  40 mg Oral Daily  . sodium chloride flush  3 mL Intravenous Q12H   Continuous Infusions: . sodium chloride     PRN Meds: sodium chloride, acetaminophen, ondansetron (ZOFRAN) IV, sodium chloride flush  Allergies:    Allergies  Allergen Reactions  . Codeine Nausea And Vomiting  . Metoprolol Other (See Comments)    Severe bradycardia.  Now getting admitted for 3rd time in 3 years for bradycardia onset after being started on toprol.  Please dont put patient on this med anymore!    Social History:   Social History   Socioeconomic History  . Marital status: Divorced    Spouse name: Not on file  . Number of children: 2  . Years of education: 10  . Highest education level: Not on file  Occupational History  . Occupation: Retired  Tobacco Use  . Smoking status: Never Smoker  . Smokeless tobacco: Never Used  . Tobacco comment: EXPOSED TO 2ND HAND SMOKE X 20+ YEARS.   Vaping Use  . Vaping Use: Never used  Substance and Sexual Activity  . Alcohol use: No  . Drug use: No  . Sexual activity: Never  Other Topics Concern  . Not on file  Social History Narrative   Lives in [place] by herself.  Daughters nearby.  Sedentary.   Eats fast food for at least one meal almost daily.   Caffeine use:  Drinks soda daily.   Coffee rarely.    Social Determinants of Health   Financial Resource Strain:   . Difficulty of Paying Living Expenses:   Food Insecurity:   . Worried About Charity fundraiser in the Last Year:   . Arboriculturist in the Last Year:   Transportation Needs:   . Lexicographer (Medical):   [name] Lack of Transportation (Non-Medical):   Physical Activity:   . Days of Exercise per Week:   . Minutes of Exercise per  Session:   Stress:   . Feeling of Stress :   Social Connections:   . Frequency of Communication with Friends and Family:   . Frequency of Social Gatherings with Friends and Family:   . Attends Religious Services:   . Active Member of Clubs or Organizations:   . Attends Archivist Meetings:   [name] Marital Status:   Intimate Partner Violence:   . Fear of Current or Ex-Partner:   . Emotionally Abused:   [name] Physically Abused:   . Sexually Abused:     Family History:   Family History  Problem Relation Age of Onset  . Hypertension Mother        died @ 2 - unknown cause.  [name] Hypertension Father        says she doesn't know anything about her father.  . Cancer Maternal Aunt   . Seizures Neg Hx      ROS:  Please see the history of present illness.  All other ROS reviewed and negative.     Physical Exam/Data:   Vitals:   07/24/19 0828 07/24/19 0858 07/24/19 0959 07/24/19 1132  BP: (!) 119/102 (!) 106/58 (!) 124/107 (!) 128/92  Pulse: 71   64  Resp: 16 15 15 11   Temp:      TempSrc:      SpO2: 100%   98%  Weight:      Height:        Intake/Output Summary (Last 24 hours) at 07/24/2019 1257 Last data filed at 07/24/2019 0406 Gross per 24 hour  Intake --  Output 600 ml  Net -600 ml   Last 3 Weights 07/23/2019 07/22/2019 06/06/2019  Weight (lbs) 234 lb 12.6 oz 234 lb 12.6 oz 234 lb 12.8 oz  Weight (kg) 106.5 kg 106.5 kg 106.505 kg     Body mass index is 42.94 kg/m.  General:  Well nourished, well developed, in no acute distress HEENT: normal Lymph: no adenopathy Neck: no JVD Endocrine:  No thryomegaly Vascular: No carotid bruits  Cardiac: RRR; no murmurs, gallops or rubs Lungs:  CTA b/l, no wheezing, rhonchi or rales  Abd: soft, nontender, obese Ext: no edema Musculoskeletal:  No deformities Skin: warm and dry   Neuro:   No gross focal abnormalities noted Psych:  Normal affect   EKG:  The EKG was personally reviewed and demonstrates:     Junctional rhythm 53bpm, is slightly irregular, though do not think is AF, conducted sinus beats as well, QRS is narrow Junctional 80 with conducted sinus beats, do not think this is AF    Telemetry:  Telemetry was personally reviewed and demonstrates:   SR 60's-70's  Relevant CV Studies:  ECHO 07/24/19   IMPRESSIONS  1. Left ventricular ejection fraction, by estimation, is 60 to 65%. The  left ventricle has normal function. The left ventricle has no regional  wall motion abnormalities. There is mild concentric left ventricular  hypertrophy. Left ventricular diastolic  parameters are consistent with Grade I diastolic dysfunction (impaired  relaxation). Elevated left atrial pressure.  2. Right ventricular systolic function is mildly reduced. The right  ventricular size is mildly enlarged. Mildly increased right ventricular  wall thickness. There is mildly elevated pulmonary artery systolic  pressure.  3. Left atrial size was moderately dilated.  4. Right atrial size was mildly dilated.  5. The mitral valve is normal in structure. No evidence of mitral valve  regurgitation. No evidence of mitral stenosis.  6. The aortic valve is normal  in structure. Aortic valve regurgitation is  not visualized. No aortic stenosis is present.  7. The inferior vena cava is normal in size with greater than 50%  respiratory variability, suggesting right atrial pressure of 3 mmHg.   Laboratory Data:  High Sensitivity Troponin:   Recent Labs  Lab 07/23/19 1834 07/23/19 2148 07/24/19 0449 07/24/19 0834 07/24/19 1146  TROPONINIHS 54* 82* 101* 106* 96*     Chemistry Recent Labs  Lab 07/21/19 2316 07/22/19 0519 07/23/19 1834  NA 124* 130* 128*  K 4.2 4.5 5.3*  CL 93* 97* 94*  CO2 20* 23 19*  GLUCOSE 356* 100* 239*  BUN 53* 51* 63*  CREATININE 2.97*  2.83* 3.36*  CALCIUM 8.3* 8.7* 8.9  GFRNONAA 15* 16* 13*  GFRAA 17* 19* 15*  ANIONGAP 11 10 15     Recent Labs  Lab 07/23/19 1834  PROT 7.3  ALBUMIN 3.6  AST 18  ALT 17  ALKPHOS 89  BILITOT 0.9   Hematology Recent Labs  Lab 07/21/19 2316 07/23/19 1834  WBC 5.5 11.6*  RBC 2.85* 3.09*  HGB 8.2* 8.9*  HCT 24.6* 26.8*  MCV 86.3 86.7  MCH 28.8 28.8  MCHC 33.3 33.2  RDW 13.8 13.7  PLT 235 254   BNP Recent Labs  Lab 07/21/19 2316 07/23/19 1834  BNP 796.1* 3,173.5*    DDimer No results for input(s): DDIMER in the last 168 hours.   Radiology/Studies:   CT HEAD WO CONTRAST Result Date: 07/22/2019 CLINICAL DATA:  Syncope. EXAM: CT HEAD WITHOUT CONTRAST TECHNIQUE: Contiguous axial images were obtained from the base of the skull through the vertex without intravenous contrast. COMPARISON:  CT head 03/07/2019.  MRI head 03/08/2019 FINDINGS: Brain: Small low-density extra-axial fluid collection on the right compatible with chronic subdural hematoma. Acute subdural hematoma on the right was seen on the prior CT. Negative for acute hemorrhage. Generalized atrophy with chronic microvascular ischemic changes in the white matter. Hypodensity in the right occipital lobe is new but appears to represent a nonacute infarct. No midline shift. Vascular: Negative for hyperdense vessel. Skull: No focal skeletal lesion. Sinuses/Orbits: Paranasal sinuses clear.  Negative orbit Other: None IMPRESSION: Small chronic subdural hematoma on the right. There was acute subdural blood on the right on the prior CT of 03/07/2019. No acute hemorrhage Chronic infarct in the right occipital lobe which is not identified on the prior MRI. Electronically Signed   By: Franchot Gallo M.D.   On: 07/22/2019 12:04   DG Chest Portable 1 View Result Date: 07/23/2019 CLINICAL DATA:  Fall, shortness of breath EXAM: PORTABLE CHEST 1 VIEW COMPARISON:  07/21/2019 FINDINGS: Cardiomegaly. Mild vascular congestion. No confluent  opacities or effusions. No acute bony abnormality or pneumothorax. Loop recorder noted over the left chest. IMPRESSION: Cardiomegaly, vascular congestion. Electronically Signed   By: Rolm Baptise M.D.   On: 07/23/2019 18:46    VAS [place] LOWER EXTREMITY VENOUS (DVT) (ONLY [facility] & [facility] 7a-7p) Result Date: 07/24/2019  Lower Venous DVTStudy Indications: Edema.  Limitations: Body habitus and poor ultrasound/tissue interface. Comparison Study: 03/06/19 previous Performing Technologist: Abram Sander RVS  Examination Guidelines: A complete evaluation includes B-mode imaging, spectral Doppler, color Doppler, and power Doppler as needed of all accessible portions of each vessel. Bilateral testing is considered an integral part of a complete examination. Limited examinations for reoccurring indications may be performed as noted. The reflux portion of the exam is performed with the patient in reverse Trendelenburg.  visualization     Summary: BILATERAL: -No evidence of popliteal cyst, bilaterally. RIGHT: - There is no evidence of deep vein thrombosis in the lower extremity. However, portions of this examination were limited- see technologist comments above.  LEFT: - There is no evidence of deep vein thrombosis in the lower extremity. However, portions of this examination were limited- see technologist comments above.  *See table(s) above for measurements and observations. Electronically signed by Deitra Mayo MD on 07/24/2019 at 8:50:12 AM.    Final      Assessment and Plan:   1. Bradycardia     She has been noted to have junctional rhythm intermittently in the past as well, HR's generally 50's or better     She is on no traditional nodal blocking agents, tegretol noted to have AV block as potential adverse affect, though did not see sinus dysfunction noted.     Loop notes some slow HRs though as best I can tell, rates <50 appear to mostly be  nocturnal pause and brady were programmed on  I am not convinced that he weakness/heart failure is bradycardia related.  She was markedly hypoxic by records when EMS got to her, and in acute HF exacerbation. Chronicallly has electrolyte abnormalities, though her NA much lower CKD and chronic anemia HFpEF known for her with DD I,  as well a some RV dysfunction on her echo  Suspect she is having more functional decline 2/2 multiple medical problems Will defer to IM though perhaps should not be living alone at home  Agree to evaluate for orthostatic changes, HR response with ambulation. Continue to avoid nodal blocking agents  Dr. Rayann Heman will see later today   For questions or updates, please contact [facility] Please consult [email or web address] for contact info under    Signed, Baldwin Jamaica, PA-C  07/24/2019 12:57 PM   I have seen, examined the patient, and reviewed the above assessment and plan.  Changes to above are made where necessary.  On exam, RRR.  Chronically ill appearing.  Her heart rates are stable.  Histograms by loop recorder are appropriate.  No symptoms of bradycardia.  No indication for pacing.  Electrophysiology team to see as needed while here. Please call with questions.   Co Sign: Thompson Grayer, MD 07/24/2019 9:14 PM

## 2019-07-24 NOTE — Progress Notes (Signed)
CSW received consult for patient for possible SNF placement. Patient was recently discharged two days ago from [facility] inpatient unit. The patient is currently in the ED waiting for an inpatient bed - CSW spoke with Rosendo Gros, RN CM to have a PT consult ordered for this patient.  Madilyn Fireman, MSW, LCSW-A Transitions of Care  Clinical Social Worker  [facility] Emergency Departments  Medical ICU [phone]

## 2019-07-24 NOTE — ED Notes (Signed)
Admitting MD notified on patient's elevated Troponin result.

## 2019-07-24 NOTE — Progress Notes (Signed)
PROGRESS NOTE    [name]  TDV:[id number]  DOB: 21-Sep-1945  PCP: Benito Mccreedy, MD Admit date:07/23/2019 Chief compliant: fall, dyspnea 74 y.o. female with medical history significant for hypertension, chronic anemia, chronic kidney disease stage IV, insulin-dependent diabetes mellitus, hypertension,obesity, and seizure disorder, who was just discharged from the hospital 6/27 (after admission for near syncope, was seen by cardiology, noted to have junctional bradycardia, had her diuretics held) and went home in improved and stable condition; now presenting to the emergency department with  progressive exertional dyspnea since then as well as ongoing dizziness resulting in a fall on the day of admission.  ED Course: Afebrile, saturating upper 70s on room air, and with stable blood pressure.  EKG features sinus or ectopic atrial rhythm with PACs and first-degree AV nodal block.  Chest x-ray is notable for cardiomegaly and vascular congestion.  Lower extremity ultrasound was limited study been negative for DVT.  Chemistry panel features sodium 128, glucose 238, BUN 63, and creatinine 3.36, up from 2.83 yesterday.  CBC with stable normocytic anemia.  Troponin is mildly elevated and BNP 3174.   Hospital course: Patient was given 60 mg IV Lasix in the ED and admitted to [facility] for further management.   Subjective:  Patient still in ED, awaiting be availability, on 2lits [place]. Denies any chest pain   Objective: Vitals:   07/24/19 0435 07/24/19 0542 07/24/19 0626 07/24/19 0730  BP:  (!) 162/74 128/89 107/80  Pulse:  76 65 71  Resp: 13 16 16 16   Temp:      TempSrc:      SpO2:  100% 100% 100%  Weight:      Height:        Intake/Output Summary (Last 24 hours) at 07/24/2019 0805 Last data filed at 07/24/2019 0406 Gross per 24 hour  Intake --  Output 600 ml  Net -600 ml   Filed Weights   07/23/19 1827  Weight: 106.5 kg    Physical Examination:  General: Moderately built, no acute  distress noted Head ENT: Atraumatic normocephalic, PERRLA, neck supple Heart: S1-S2 heard, regular rate and rhythm, no murmurs.  No leg edema noted Lungs: Equal air entry bilaterally, no rhonchi or rales on exam, no accessory muscle use Abdomen: Bowel sounds heard, soft, nontender, nondistended. No organomegaly.  No CVA tenderness Extremities: Trace/leg pedal edema.  No cyanosis or clubbing. Neurological: Awake alert oriented x3, no focal weakness or numbness, strength and sensations to crude touch intact Skin: No wounds or rashes.   Data Reviewed: I have personally reviewed following labs and imaging studies  CBC: Recent Labs  Lab 07/21/19 2316 07/23/19 1834  WBC 5.5 11.6*  NEUTROABS 4.6 10.5*  HGB 8.2* 8.9*  HCT 24.6* 26.8*  MCV 86.3 86.7  PLT 235 062   Basic Metabolic Panel: Recent Labs  Lab 07/21/19 2316 07/22/19 0519 07/23/19 1834  NA 124* 130* 128*  K 4.2 4.5 5.3*  CL 93* 97* 94*  CO2 20* 23 19*  GLUCOSE 356* 100* 239*  BUN 53* 51* 63*  CREATININE 2.97* 2.83* 3.36*  CALCIUM 8.3* 8.7* 8.9  MG  --   --  2.2   GFR: Estimated Creatinine Clearance: 17.4 mL/min (A) (by C-G formula based on SCr of 3.36 mg/dL (H)). Liver Function Tests: Recent Labs  Lab 07/23/19 1834  AST 18  ALT 17  ALKPHOS 89  BILITOT 0.9  PROT 7.3  ALBUMIN 3.6   No results for input(s): LIPASE, AMYLASE in the last 168  hours. No results for input(s): AMMONIA in the last 168 hours. Coagulation Profile: No results for input(s): INR, PROTIME in the last 168 hours. Cardiac Enzymes: No results for input(s): CKTOTAL, CKMB, CKMBINDEX, TROPONINI in the last 168 hours. BNP (last 3 results) No results for input(s): PROBNP in the last 8760 hours. HbA1C: Recent Labs    07/22/19 0519  HGBA1C 5.3   CBG: Recent Labs  Lab 07/22/19 0108 07/22/19 0349 07/22/19 0725 07/22/19 1138 07/24/19 0732  GLUCAP 234* 84 148* 89 105*   Lipid Profile: No results for input(s): CHOL, HDL, LDLCALC, TRIG,  CHOLHDL, LDLDIRECT in the last 72 hours. Thyroid Function Tests: No results for input(s): TSH, T4TOTAL, FREET4, T3FREE, THYROIDAB in the last 72 hours. Anemia Panel: No results for input(s): VITAMINB12, FOLATE, FERRITIN, TIBC, IRON, RETICCTPCT in the last 72 hours. Sepsis Labs: No results for input(s): PROCALCITON, LATICACIDVEN in the last 168 hours.  Recent Results (from the past 240 hour(s))  SARS Coronavirus 2 by RT PCR (hospital order, performed in [facility] hospital lab) Nasopharyngeal Nasopharyngeal Swab     Status: None   Collection Time: 07/22/19  1:05 AM   Specimen: Nasopharyngeal Swab  Result Value Ref Range Status   SARS Coronavirus 2 NEGATIVE NEGATIVE Final    Comment: (NOTE) SARS-CoV-2 target nucleic acids are NOT DETECTED.  The SARS-CoV-2 RNA is generally detectable in upper and lower respiratory specimens during the acute phase of infection. The lowest concentration of SARS-CoV-2 viral copies this assay can detect is 250 copies / mL. A negative result does not preclude SARS-CoV-2 infection and should not be used as the sole basis for treatment or other patient management decisions.  A negative result may occur with improper specimen collection / handling, submission of specimen other than nasopharyngeal swab, presence of viral mutation(s) within the areas targeted by this assay, and inadequate number of viral copies (<250 copies / mL). A negative result must be combined with clinical observations, patient history, and epidemiological information.  Fact Sheet for Patients:   StrictlyIdeas.no  Fact Sheet for Healthcare Providers: BankingDealers.co.za  This test is not yet approved or  cleared by the [place] FDA and has been authorized for detection and/or diagnosis of SARS-CoV-2 by FDA under an Emergency Use Authorization (EUA).  This EUA will remain in effect (meaning this test can be used) for the duration of  the COVID-19 declaration under Section 564(b)(1) of the Act, 21 U.S.C. section 360bbb-3(b)(1), unless the authorization is terminated or revoked sooner.  Performed at [facility], [place] [address] [place], [place] [address]   SARS Coronavirus 2 by RT PCR (hospital order, performed in [facility] hospital lab) Nasopharyngeal Nasopharyngeal Swab     Status: None   Collection Time: 07/23/19  7:55 PM   Specimen: Nasopharyngeal Swab  Result Value Ref Range Status   SARS Coronavirus 2 NEGATIVE NEGATIVE Final    Comment: (NOTE) SARS-CoV-2 target nucleic acids are NOT DETECTED.  The SARS-CoV-2 RNA is generally detectable in upper and lower respiratory specimens during the acute phase of infection. The lowest concentration of SARS-CoV-2 viral copies this assay can detect is 250 copies / mL. A negative result does not preclude SARS-CoV-2 infection and should not be used as the sole basis for treatment or other patient management decisions.  A negative result may occur with improper specimen collection / handling, submission of specimen other than nasopharyngeal swab, presence of viral mutation(s) within the areas targeted by this assay, and inadequate number of viral copies (<250  copies / mL). A negative result must be combined with clinical observations, patient history, and epidemiological information.  Fact Sheet for Patients:   StrictlyIdeas.no  Fact Sheet for Healthcare Providers: BankingDealers.co.za  This test is not yet approved or  cleared by the [place] FDA and has been authorized for detection and/or diagnosis of SARS-CoV-2 by FDA under an Emergency Use Authorization (EUA).  This EUA will remain in effect (meaning this test can be used) for the duration of the COVID-19 declaration under Section 564(b)(1) of the Act, 21 U.S.C. section 360bbb-3(b)(1), unless the authorization is terminated or revoked sooner.  Performed  at [facility], [place] [address], [place], [place] [address]       Radiology Studies: CT HEAD WO CONTRAST  Result Date: 07/22/2019 CLINICAL DATA:  Syncope. EXAM: CT HEAD WITHOUT CONTRAST TECHNIQUE: Contiguous axial images were obtained from the base of the skull through the vertex without intravenous contrast. COMPARISON:  CT head 03/07/2019.  MRI head 03/08/2019 FINDINGS: Brain: Small low-density extra-axial fluid collection on the right compatible with chronic subdural hematoma. Acute subdural hematoma on the right was seen on the prior CT. Negative for acute hemorrhage. Generalized atrophy with chronic microvascular ischemic changes in the white matter. Hypodensity in the right occipital lobe is new but appears to represent a nonacute infarct. No midline shift. Vascular: Negative for hyperdense vessel. Skull: No focal skeletal lesion. Sinuses/Orbits: Paranasal sinuses clear.  Negative orbit Other: None IMPRESSION: Small chronic subdural hematoma on the right. There was acute subdural blood on the right on the prior CT of 03/07/2019. No acute hemorrhage Chronic infarct in the right occipital lobe which is not identified on the prior MRI. Electronically Signed   By: Franchot Gallo M.D.   On: 07/22/2019 12:04   DG Chest Portable 1 View  Result Date: 07/23/2019 CLINICAL DATA:  Fall, shortness of breath EXAM: PORTABLE CHEST 1 VIEW COMPARISON:  07/21/2019 FINDINGS: Cardiomegaly. Mild vascular congestion. No confluent opacities or effusions. No acute bony abnormality or pneumothorax. Loop recorder noted over the left chest. IMPRESSION: Cardiomegaly, vascular congestion. Electronically Signed   By: Rolm Baptise M.D.   On: 07/23/2019 18:46   VAS [place] LOWER EXTREMITY VENOUS (DVT) (ONLY [facility] & [facility] 7a-7p)  Result Date: 07/23/2019  Lower Venous DVTStudy Indications: Edema.  Limitations: Body habitus and poor ultrasound/tissue interface. Comparison Study: 03/06/19 previous Performing Technologist: Abram Sander  RVS  Examination Guidelines: A complete evaluation includes B-mode imaging, spectral Doppler, color Doppler, and power Doppler as needed of all accessible portions of each vessel. Bilateral testing is considered an integral part of a complete examination. Limited examinations for reoccurring indications may be performed as noted. The reflux portion of the exam is performed with the patient in reverse Trendelenburg.  +---------+---------------+---------+-----------+----------+------------------+ RIGHT    CompressibilityPhasicitySpontaneityPropertiesThrombus Aging     +---------+---------------+---------+-----------+----------+------------------+ CFV      Full           Yes      Yes                                     +---------+---------------+---------+-----------+----------+------------------+ SFJ      Full                                                            +---------+---------------+---------+-----------+----------+------------------+  FV Prox  Full                                                            +---------+---------------+---------+-----------+----------+------------------+ FV Mid                  Yes      Yes                                     +---------+---------------+---------+-----------+----------+------------------+ FV Distal                                             Not visualized     +---------+---------------+---------+-----------+----------+------------------+ PFV      Full                                                            +---------+---------------+---------+-----------+----------+------------------+ POP      Full           Yes      Yes                                     +---------+---------------+---------+-----------+----------+------------------+ PTV      Full                                         limited                                                                  visualization       +---------+---------------+---------+-----------+----------+------------------+ PERO     Full                                         limited                                                                  visualization      +---------+---------------+---------+-----------+----------+------------------+   +---------+---------------+---------+-----------+----------+------------------+ LEFT     CompressibilityPhasicitySpontaneityPropertiesThrombus Aging     +---------+---------------+---------+-----------+----------+------------------+ CFV      Full           Yes      Yes                                     +---------+---------------+---------+-----------+----------+------------------+  SFJ      Full                                                            +---------+---------------+---------+-----------+----------+------------------+ FV Prox  Full                                                            +---------+---------------+---------+-----------+----------+------------------+ FV Mid                  Yes      Yes                                     +---------+---------------+---------+-----------+----------+------------------+ FV Distal                                             Not visualized     +---------+---------------+---------+-----------+----------+------------------+ PFV      Full                                                            +---------+---------------+---------+-----------+----------+------------------+ POP      Full           Yes      Yes                                     +---------+---------------+---------+-----------+----------+------------------+ PTV      Full                                         limited                                                                  visualization      +---------+---------------+---------+-----------+----------+------------------+ PERO     Full                                          limited  visualization      +---------+---------------+---------+-----------+----------+------------------+     Summary: BILATERAL: -No evidence of popliteal cyst, bilaterally. RIGHT: - There is no evidence of deep vein thrombosis in the lower extremity. However, portions of this examination were limited- see technologist comments above.  LEFT: - There is no evidence of deep vein thrombosis in the lower extremity. However, portions of this examination were limited- see technologist comments above.  *See table(s) above for measurements and observations.    Preliminary       Scheduled Meds: . amLODipine  10 mg Oral Daily  . aspirin  81 mg Oral Daily  . carbamazepine  400 mg Oral BID  . citalopram  20 mg Oral Daily  . ezetimibe-simvastatin  1 tablet Oral Daily  . furosemide  40 mg Intravenous Q12H  . heparin  5,000 Units Subcutaneous Q8H  . hydrALAZINE  25 mg Oral Daily  . insulin aspart  0-5 Units Subcutaneous QHS  . insulin aspart  0-9 Units Subcutaneous TID WC  . mirtazapine  15 mg Oral QHS  . pantoprazole  40 mg Oral Daily  . sodium chloride flush  3 mL Intravenous Q12H   Continuous Infusions: . sodium chloride       Assessment/Plan:  1. Acute hypoxic respiratory failure due to acute on chronic diastolic CHF  in the setting of holding diuretics. CXR with cardiomegaly and vascular congestion, and BNP is 3174 (much higher than priors) . She was given Lasix 60 mg IV in ED. Admitted  with Lasix 40 mg IV q12h, monitor fluid balance with daily weights and I/Os, closely monitor electrolytes and renal function during diuresis, f/u repeat echo results (last one in 02/2019).  2. Near-syncope - Patient reports recurrent episodes of near-syncope while ambulating, has loop recorder for cryptogenic CVA, was seen by cardiology for this in the hospital 07/22/19 and was noted to have sinus node  dysfunction with junctional rhythm and rate in 50s. Avoid AV blockers.  Continue cardiac monitoring, check orthostatic vitals, f/u echocardiogram  . Cardiology consulted to f/u and possibly holter/event monitor  3. Hypervolemic Hyponatremia - Serum sodium is 128 in ED, corrected to 130 when accounting for hyperglycemia.  Restrict free-water, monitor closely with diuretics . Of note sodium was at 124 at the time of last admission and improved to 130 prior to discharge.  4. Acute kidney injury superimposed on CKD IV - SCr 3.36 in ED, up from 2.83 the day before .Baseline creatinine around 3.1-3.5. Cardiorenal syndrome vs congested kidney.She appeared hypervolemic in ED and is being diuresed -labs this morning pending. Should improve if congested kidney. Check renal USG  5. Elevated troponin : HS troponin is 54->82->101. Demand ischemia vs flash pulm edema in the setting of new cardiac insult. Will trend with 2 more sets. Request cardiology eval given uptrend and f/u echo to r/o wall motion abnormalities.  No c/o chest pain or acute ST abnormality on EKG.  Continue cardiac monitoring,ASA and statin   6. Seizure disorder - Continue Tegretol    7. Hypertension - Continue Norvasc and hydralazine. Avoiding AV blockers due to junctional brady/ problem #2    8. Type II DM - A1c was only 5.3% in June 2021 - Check CBGs and use low-intensity SSI   9. Prior CVA-continue home medications, outpatient follow-up with neurology.  CT scan during recent admission without acute findings.  10. Anemia of chronic renal disease: - Appears stable, monitor   DVT prophylaxis: heparin sq Code Status: Full code Family / Patient Communication:  d/w patient  Disposition Plan:   Status is: Inpatient  Remains inpatient appropriate because:Hemodynamically unstable   Dispo: The patient is from: Home              Anticipated d/c is to: Home              Anticipated d/c date is: 2 days              Patient currently is  not medically stable to d/c.     Guilford Shi, MD [facility] Pager in [place]  If 7PM-7AM, please contact night-coverage [email or web address] 07/24/2019, 8:05 AM

## 2019-07-25 DIAGNOSIS — N184 Chronic kidney disease, stage 4 (severe): Secondary | ICD-10-CM

## 2019-07-25 DIAGNOSIS — N179 Acute kidney failure, unspecified: Secondary | ICD-10-CM

## 2019-07-25 DIAGNOSIS — I5033 Acute on chronic diastolic (congestive) heart failure: Secondary | ICD-10-CM

## 2019-07-25 LAB — BASIC METABOLIC PANEL
Anion gap: 13 (ref 5–15)
BUN: 69 mg/dL — ABNORMAL HIGH (ref 8–23)
CO2: 22 mmol/L (ref 22–32)
Calcium: 8.9 mg/dL (ref 8.9–10.3)
Chloride: 98 mmol/L (ref 98–111)
Creatinine, Ser: 3.1 mg/dL — ABNORMAL HIGH (ref 0.44–1.00)
GFR calc Af Amer: 17 mL/min — ABNORMAL LOW (ref >60)
GFR calc non Af Amer: 14 mL/min — ABNORMAL LOW (ref >60)
Glucose, Bld: 92 mg/dL (ref 70–99)
Potassium: 4.4 mmol/L (ref 3.5–5.1)
Sodium: 133 mmol/L — ABNORMAL LOW (ref 135–145)

## 2019-07-25 LAB — CBC WITH DIFFERENTIAL/PLATELET
Abs Immature Granulocytes: 0.01 10*3/uL (ref 0.00–0.07)
Basophils Absolute: 0 10*3/uL (ref 0.0–0.1)
Basophils Relative: 0 %
Eosinophils Absolute: 0 10*3/uL (ref 0.0–0.5)
Eosinophils Relative: 0 %
HCT: 27.8 % — ABNORMAL LOW (ref 36.0–46.0)
Hemoglobin: 9.3 g/dL — ABNORMAL LOW (ref 12.0–15.0)
Immature Granulocytes: 0 %
Lymphocytes Relative: 17 %
Lymphs Abs: 1.2 10*3/uL (ref 0.7–4.0)
MCH: 29.5 pg (ref 26.0–34.0)
MCHC: 33.5 g/dL (ref 30.0–36.0)
MCV: 88.3 fL (ref 80.0–100.0)
Monocytes Absolute: 0.7 10*3/uL (ref 0.1–1.0)
Monocytes Relative: 9 %
Neutro Abs: 5.5 10*3/uL (ref 1.7–7.7)
Neutrophils Relative %: 74 %
Platelets: 265 10*3/uL (ref 150–400)
RBC: 3.15 MIL/uL — ABNORMAL LOW (ref 3.87–5.11)
RDW: 13.5 % (ref 11.5–15.5)
WBC: 7.4 10*3/uL (ref 4.0–10.5)
nRBC: 0 % (ref 0.0–0.2)

## 2019-07-25 LAB — GLUCOSE, CAPILLARY
Glucose-Capillary: 87 mg/dL (ref 70–99)
Glucose-Capillary: 126 mg/dL — ABNORMAL HIGH (ref 70–99)
Glucose-Capillary: 127 mg/dL — ABNORMAL HIGH (ref 70–99)
Glucose-Capillary: 146 mg/dL — ABNORMAL HIGH (ref 70–99)

## 2019-07-25 LAB — IRON AND TIBC
Iron: 17 ug/dL — ABNORMAL LOW (ref 28–170)
Saturation Ratios: 5 % — ABNORMAL LOW (ref 10.4–31.8)
TIBC: 351 ug/dL (ref 250–450)
UIBC: 334 ug/dL

## 2019-07-25 LAB — MAGNESIUM: Magnesium: 2.2 mg/dL (ref 1.7–2.4)

## 2019-07-25 LAB — FERRITIN: Ferritin: 35 ng/mL (ref 11–307)

## 2019-07-25 MED ORDER — SIMVASTATIN 20 MG PO TABS
40.0000 mg | ORAL_TABLET | Freq: Every day | ORAL | Status: DC
  Administered 2019-07-25 – 2019-07-27 (×3): 40 mg via ORAL
  Filled 2019-07-25 (×3): qty 2

## 2019-07-25 MED ORDER — EZETIMIBE 10 MG PO TABS
10.0000 mg | ORAL_TABLET | Freq: Every day | ORAL | Status: DC
  Administered 2019-07-25 – 2019-07-27 (×3): 10 mg via ORAL
  Filled 2019-07-25 (×3): qty 1

## 2019-07-25 MED ORDER — AMLODIPINE BESYLATE 5 MG PO TABS
5.0000 mg | ORAL_TABLET | Freq: Every day | ORAL | Status: DC
  Administered 2019-07-26 – 2019-07-27 (×2): 5 mg via ORAL
  Filled 2019-07-25 (×2): qty 1

## 2019-07-25 MED ORDER — FUROSEMIDE 80 MG PO TABS
80.0000 mg | ORAL_TABLET | Freq: Every day | ORAL | Status: DC
  Administered 2019-07-26 – 2019-07-27 (×2): 80 mg via ORAL
  Filled 2019-07-25 (×3): qty 1

## 2019-07-25 NOTE — Progress Notes (Signed)
Patient ID: [name], female   DOB: 01-Jul-1945, 74 y.o.   MRN: [id number]  PROGRESS NOTE    [name]  JJH:[id number] DOB: 1945/02/13 DOA: 07/23/2019 PCP: Benito Mccreedy, MD   Brief Narrative:  74 year old female with history of hypertension, anemia of chronic disease, chronic kidney disease stage IV, diabetes mellitus type 2, hypertension, obesity, seizure disorder, recent hospitalization and discharge on 07/22/2019 for near syncope during which her diuretics were held by cardiology presented with increasing exertional dyspnea with ongoing dizziness.  On presentation in the ED, she was saturating in the upper 70s on room air and chest x-ray was showing cardiomegaly with vascular congestion.  Lower extremity ultrasound was limited study but negative for DVT.  Creatinine was 3.36, up from 2.83 on 07/22/2019 with mildly elevated troponin and BNP of 3174.  She was started on intravenous Lasix.  Cardiology was consulted.  Assessment & Plan:   Acute hypoxic respiratory failure -Probably from CHF exacerbation. -Currently still requiring 2 L oxygen via nasal cannula.  Wean off as able. -Lower extremity duplex ultrasound on presentation was technically limited but negative for DVT in bilateral lower extremities  Acute on chronic diastolic heart failure -Patient presented with worsening shortness of breath and chest x-ray showed cardiomegaly with vascular congestion.  Patient was recently discharged from the hospital on 07/22/2019 off of diuretics. -Continue Lasix.  Cardiology following.  Continue hydralazine.  Strict input and output.  Daily weights.  Fluid restriction.  Echo showed EF of 60 to 65% with grade 1 diastolic dysfunction  Near syncope -Probably from fluid overload and heart failure. -EP evaluation appreciated and recommend no indication for pacing.  EP signed off. -Fall precautions.  PT eval.  Hypervolemic hyponatremia -Sodium was 138 in the ED.  133 today.  Monitor.  Chronic  kidney disease stage IV -Baseline creatinine around 3.1-3.5 -Presented with creatinine of 3.36 in the ED -Creatinine 3.1 today. -Monitor. -Lasix as above -Cardiology is recommending nephrology evaluation.  Will consult nephrology.  Elevated troponin -Probably from demand ischemia due to CHF exacerbation. -Troponins did not trend up significantly.  Echo as above.  Cardiology following.  No chest pain.  Continue aspirin, statin  Seizure disorder -Continue Tegretol.  Hypertension -Continue Norvasc and hydralazine.  Avoiding AV blockers due to junctional bradycardia/near syncope  Diabetes mellitus type 2 -A1c 5.3 in June 2021.  Continue CBGs with low-dose SSI  Prior unspecified CVA -Outpatient follow-up with neurology.  Continue aspirin and statin.  CT scan of the brain without contrast during recent admission without acute findings  Anemia of chronic disease -From chronic kidney disease.  Hemoglobin stable.  Monitor  Generalized deconditioning -PT eval  Morbid obesity -Outpatient follow-up  DVT prophylaxis: Heparin subcutaneous Code Status: Full Family Communication: Patient at bedside Disposition Plan: Status is: Inpatient  Remains inpatient appropriate because:IV treatments appropriate due to intensity of illness or inability to take PO   Dispo: The patient is from: Home              Anticipated d/c is to: Home              Anticipated d/c date is: 2 days              Patient currently is not medically stable to d/c.  Consultants: Cardiology.  Will consult nephrology  Procedures:  Echo  1. Left ventricular ejection fraction, by estimation, is 60 to 65%. The  left ventricle has normal function. The left ventricle has no regional  wall motion  abnormalities. There is mild concentric left ventricular  hypertrophy. Left ventricular diastolic  parameters are consistent with Grade I diastolic dysfunction (impaired  relaxation). Elevated left atrial pressure.  2.  Right ventricular systolic function is mildly reduced. The right  ventricular size is mildly enlarged. Mildly increased right ventricular  wall thickness. There is mildly elevated pulmonary artery systolic  pressure.  3. Left atrial size was moderately dilated.  4. Right atrial size was mildly dilated.  5. The mitral valve is normal in structure. No evidence of mitral valve  regurgitation. No evidence of mitral stenosis.  6. The aortic valve is normal in structure. Aortic valve regurgitation is  not visualized. No aortic stenosis is present.  7. The inferior vena cava is normal in size with greater than 50%  respiratory variability, suggesting right atrial pressure of 3 mmHg.  Antimicrobials: None   Subjective: Patient seen and examined at bedside.  She is a poor historian.  Feels slightly better but still feels short of breath with exertion.  No overnight fever, vomiting, chest pain.  Objective: Vitals:   07/25/19 0039 07/25/19 0427 07/25/19 0728 07/25/19 1015  BP: 124/68 (!) 108/91 (!) 126/56 101/75  Pulse: 67 73 64 66  Resp: 18 18 16 18   Temp: 98.1 F (36.7 C) 98.2 F (36.8 C) 98.2 F (36.8 C) 98.3 F (36.8 C)  TempSrc: Oral Oral Oral Oral  SpO2: 100% 100% 100% 99%  Weight:  103.9 kg    Height:        Intake/Output Summary (Last 24 hours) at 07/25/2019 1123 Last data filed at 07/25/2019 0851 Gross per 24 hour  Intake 460 ml  Output 400 ml  Net 60 ml   Filed Weights   07/23/19 1827 07/25/19 0427  Weight: 106.5 kg 103.9 kg    Examination:  General exam: Appears calm and comfortable.  Looks chronically ill.  Currently on 2 L oxygen via nasal cannula Respiratory system: Bilateral decreased breath sounds at bases with basilar crackles Cardiovascular system: S1 & S2 heard, Rate controlled Gastrointestinal system: Abdomen is morbidly obese, nondistended, soft and nontender. Normal bowel sounds heard. Extremities: No cyanosis, clubbing; bilateral lower extremity  edema present  Central nervous system: Awake, poor historian.  No focal neurological deficits. Moving extremities Skin: No rashes, lesions or ulcers Psychiatry: Flat affect    Data Reviewed: I have personally reviewed following labs and imaging studies  CBC: Recent Labs  Lab 07/21/19 2316 07/23/19 1834 07/25/19 0800  WBC 5.5 11.6* 7.4  NEUTROABS 4.6 10.5* 5.5  HGB 8.2* 8.9* 9.3*  HCT 24.6* 26.8* 27.8*  MCV 86.3 86.7 88.3  PLT 235 254 580   Basic Metabolic Panel: Recent Labs  Lab 07/21/19 2316 07/22/19 0519 07/23/19 1834 07/25/19 0800  NA 124* 130* 128* 133*  K 4.2 4.5 5.3* 4.4  CL 93* 97* 94* 98  CO2 20* 23 19* 22  GLUCOSE 356* 100* 239* 92  BUN 53* 51* 63* 69*  CREATININE 2.97* 2.83* 3.36* 3.10*  CALCIUM 8.3* 8.7* 8.9 8.9  MG  --   --  2.2 2.2   GFR: Estimated Creatinine Clearance: 18.5 mL/min (A) (by C-G formula based on SCr of 3.1 mg/dL (H)). Liver Function Tests: Recent Labs  Lab 07/23/19 1834  AST 18  ALT 17  ALKPHOS 89  BILITOT 0.9  PROT 7.3  ALBUMIN 3.6   No results for input(s): LIPASE, AMYLASE in the last 168 hours. No results for input(s): AMMONIA in the last 168 hours. Coagulation Profile: No results  for input(s): INR, PROTIME in the last 168 hours. Cardiac Enzymes: No results for input(s): CKTOTAL, CKMB, CKMBINDEX, TROPONINI in the last 168 hours. BNP (last 3 results) No results for input(s): PROBNP in the last 8760 hours. HbA1C: No results for input(s): HGBA1C in the last 72 hours. CBG: Recent Labs  Lab 07/24/19 1131 07/24/19 1653 07/24/19 2129 07/25/19 0618 07/25/19 1055  GLUCAP 147* 172* 104* 87 126*   Lipid Profile: No results for input(s): CHOL, HDL, LDLCALC, TRIG, CHOLHDL, LDLDIRECT in the last 72 hours. Thyroid Function Tests: No results for input(s): TSH, T4TOTAL, FREET4, T3FREE, THYROIDAB in the last 72 hours. Anemia Panel: No results for input(s): VITAMINB12, FOLATE, FERRITIN, TIBC, IRON, RETICCTPCT in the last 72  hours. Sepsis Labs: No results for input(s): PROCALCITON, LATICACIDVEN in the last 168 hours.  Recent Results (from the past 240 hour(s))  SARS Coronavirus 2 by RT PCR (hospital order, performed in [facility] hospital lab) Nasopharyngeal Nasopharyngeal Swab     Status: None   Collection Time: 07/22/19  1:05 AM   Specimen: Nasopharyngeal Swab  Result Value Ref Range Status   SARS Coronavirus 2 NEGATIVE NEGATIVE Final    Comment: (NOTE) SARS-CoV-2 target nucleic acids are NOT DETECTED.  The SARS-CoV-2 RNA is generally detectable in upper and lower respiratory specimens during the acute phase of infection. The lowest concentration of SARS-CoV-2 viral copies this assay can detect is 250 copies / mL. A negative result does not preclude SARS-CoV-2 infection and should not be used as the sole basis for treatment or other patient management decisions.  A negative result may occur with improper specimen collection / handling, submission of specimen other than nasopharyngeal swab, presence of viral mutation(s) within the areas targeted by this assay, and inadequate number of viral copies (<250 copies / mL). A negative result must be combined with clinical observations, patient history, and epidemiological information.  Fact Sheet for Patients:   StrictlyIdeas.no  Fact Sheet for Healthcare Providers: BankingDealers.co.za  This test is not yet approved or  cleared by the [place] FDA and has been authorized for detection and/or diagnosis of SARS-CoV-2 by FDA under an Emergency Use Authorization (EUA).  This EUA will remain in effect (meaning this test can be used) for the duration of the COVID-19 declaration under Section 564(b)(1) of the Act, 21 U.S.C. section 360bbb-3(b)(1), unless the authorization is terminated or revoked sooner.  Performed at [facility], [place] [address], [place], [place] [address]   SARS Coronavirus 2 by RT  PCR (hospital order, performed in [facility] hospital lab) Nasopharyngeal Nasopharyngeal Swab     Status: None   Collection Time: 07/23/19  7:55 PM   Specimen: Nasopharyngeal Swab  Result Value Ref Range Status   SARS Coronavirus 2 NEGATIVE NEGATIVE Final    Comment: (NOTE) SARS-CoV-2 target nucleic acids are NOT DETECTED.  The SARS-CoV-2 RNA is generally detectable in upper and lower respiratory specimens during the acute phase of infection. The lowest concentration of SARS-CoV-2 viral copies this assay can detect is 250 copies / mL. A negative result does not preclude SARS-CoV-2 infection and should not be used as the sole basis for treatment or other patient management decisions.  A negative result may occur with improper specimen collection / handling, submission of specimen other than nasopharyngeal swab, presence of viral mutation(s) within the areas targeted by this assay, and inadequate number of viral copies (<250 copies / mL). A negative result must be combined with clinical observations, patient history, and epidemiological information.  Fact Sheet for Patients:   StrictlyIdeas.no  Fact Sheet for Healthcare Providers: BankingDealers.co.za  This test is not yet approved or  cleared by the [place] FDA and has been authorized for detection and/or diagnosis of SARS-CoV-2 by FDA under an Emergency Use Authorization (EUA).  This EUA will remain in effect (meaning this test can be used) for the duration of the COVID-19 declaration under Section 564(b)(1) of the Act, 21 U.S.C. section 360bbb-3(b)(1), unless the authorization is terminated or revoked sooner.  Performed at [facility], [place] [address], [place], [place] [address]          Radiology Studies: DG Chest Portable 1 View  Result Date: 07/23/2019 CLINICAL DATA:  Fall, shortness of breath EXAM: PORTABLE CHEST 1 VIEW COMPARISON:  07/21/2019 FINDINGS:  Cardiomegaly. Mild vascular congestion. No confluent opacities or effusions. No acute bony abnormality or pneumothorax. Loop recorder noted over the left chest. IMPRESSION: Cardiomegaly, vascular congestion. Electronically Signed   By: Rolm Baptise M.D.   On: 07/23/2019 18:46   ECHOCARDIOGRAM COMPLETE  Result Date: 07/24/2019    ECHOCARDIOGRAM REPORT   Patient Name:   [name] Date of Exam: 07/24/2019 Medical Rec #:  [id number]      Height:       62.0 in Accession #:    [id number]     Weight:       234.8 lb Date of Birth:  1945/07/01       BSA:          2.047 m Patient Age:    64 years       BP:           107/80 mmHg Patient Gender: F              HR:           72 bpm. Exam Location:  Inpatient Procedure: 2D Echo Indications:    acute diastolic chf 008.67  History:        Patient has prior history of Echocardiogram examinations, most                 recent 03/07/2019. CHF, chronic kidney disease,                 Signs/Symptoms:Shortness of Breath and elevated troponin; Risk                 Factors:Diabetes and Hypertension.  Sonographer:    Johny Chess Referring Phys: [id number] [place]  1. Left ventricular ejection fraction, by estimation, is 60 to 65%. The left ventricle has normal function. The left ventricle has no regional wall motion abnormalities. There is mild concentric left ventricular hypertrophy. Left ventricular diastolic parameters are consistent with Grade I diastolic dysfunction (impaired relaxation). Elevated left atrial pressure.  2. Right ventricular systolic function is mildly reduced. The right ventricular size is mildly enlarged. Mildly increased right ventricular wall thickness. There is mildly elevated pulmonary artery systolic pressure.  3. Left atrial size was moderately dilated.  4. Right atrial size was mildly dilated.  5. The mitral valve is normal in structure. No evidence of mitral valve regurgitation. No evidence of mitral stenosis.  6. The aortic valve is  normal in structure. Aortic valve regurgitation is not visualized. No aortic stenosis is present.  7. The inferior vena cava is normal in size with greater than 50% respiratory variability, suggesting right atrial pressure of 3 mmHg. FINDINGS  Left Ventricle: Left ventricular ejection fraction, by estimation, is  60 to 65%. The left ventricle has normal function. The left ventricle has no regional wall motion abnormalities. The left ventricular internal cavity size was normal in size. There is  mild concentric left ventricular hypertrophy. Left ventricular diastolic parameters are consistent with Grade I diastolic dysfunction (impaired relaxation). Elevated left atrial pressure. Right Ventricle: The right ventricular size is mildly enlarged. Mildly increased right ventricular wall thickness. Right ventricular systolic function is mildly reduced. There is mildly elevated pulmonary artery systolic pressure. The tricuspid regurgitant velocity is 2.76 m/s, and with an assumed right atrial pressure of 3 mmHg, the estimated right ventricular systolic pressure is 75.1 mmHg. Left Atrium: Left atrial size was moderately dilated. Right Atrium: Right atrial size was mildly dilated. Pericardium: There is no evidence of pericardial effusion. Mitral Valve: The mitral valve is normal in structure. Normal mobility of the mitral valve leaflets. No evidence of mitral valve regurgitation. No evidence of mitral valve stenosis. Tricuspid Valve: The tricuspid valve is normal in structure. Tricuspid valve regurgitation is not demonstrated. No evidence of tricuspid stenosis. Aortic Valve: The aortic valve is normal in structure. Aortic valve regurgitation is not visualized. No aortic stenosis is present. Pulmonic Valve: The pulmonic valve was normal in structure. Pulmonic valve regurgitation is not visualized. No evidence of pulmonic stenosis. Aorta: The aortic root is normal in size and structure. Venous: The inferior vena cava is normal  in size with greater than 50% respiratory variability, suggesting right atrial pressure of 3 mmHg. IAS/Shunts: No atrial level shunt detected by color flow Doppler.  LEFT VENTRICLE PLAX 2D LVIDd:         3.90 cm  Diastology LVIDs:         2.80 cm  LV e' lateral:   6.00 cm/s LV PW:         1.20 cm  LV E/e' lateral: 14.8 LVOT diam:     2.10 cm  LV e' medial:    6.09 cm/s LV SV:         67       LV E/e' medial:  14.6 LV SV Index:   33 LVOT Area:     3.46 cm  RIGHT VENTRICLE            IVC RV S prime:     9.90 cm/s  IVC diam: 1.60 cm TAPSE (M-mode): 1.6 cm LEFT ATRIUM             Index       RIGHT ATRIUM           Index LA diam:        4.60 cm 2.25 cm/m  RA Area:     13.70 cm LA Vol (A2C):   54.8 ml 26.78 ml/m RA Volume:   32.00 ml  15.64 ml/m LA Vol (A4C):   57.3 ml 28.00 ml/m LA Biplane Vol: 58.5 ml 28.58 ml/m  AORTIC VALVE LVOT Vmax:   93.30 cm/s LVOT Vmean:  57.100 cm/s LVOT VTI:    0.193 m  AORTA Ao Root diam: 2.80 cm Ao Asc diam:  2.70 cm MITRAL VALVE                TRICUSPID VALVE MV Area (PHT): 2.76 cm     TR Peak grad:   30.5 mmHg MV Decel Time: 275 msec     TR Vmax:        276.00 cm/s MV E velocity: 88.70 cm/s MV A velocity: 103.00 cm/s  SHUNTS MV E/A ratio:  0.86  Systemic VTI:  0.19 m                             Systemic Diam: 2.10 cm Dani Gobble Croitoru MD Electronically signed by Sanda Klein MD Signature Date/Time: 07/24/2019/10:50:37 AM    Final    VAS [place] LOWER EXTREMITY VENOUS (DVT) (ONLY [facility] & [facility] 7a-7p)  Result Date: 07/24/2019  Lower Venous DVTStudy Indications: Edema.  Limitations: Body habitus and poor ultrasound/tissue interface. Comparison Study: 03/06/19 previous Performing Technologist: Abram Sander RVS  Examination Guidelines: A complete evaluation includes B-mode imaging, spectral Doppler, color Doppler, and power Doppler as needed of all accessible portions of each vessel. Bilateral testing is considered an integral part of a complete examination. Limited examinations for  reoccurring indications may be performed as noted. The reflux portion of the exam is performed with the patient in reverse Trendelenburg.  +---------+---------------+---------+-----------+----------+------------------+ RIGHT    CompressibilityPhasicitySpontaneityPropertiesThrombus Aging     +---------+---------------+---------+-----------+----------+------------------+ CFV      Full           Yes      Yes                                     +---------+---------------+---------+-----------+----------+------------------+ SFJ      Full                                                            +---------+---------------+---------+-----------+----------+------------------+ FV Prox  Full                                                            +---------+---------------+---------+-----------+----------+------------------+ FV Mid                  Yes      Yes                                     +---------+---------------+---------+-----------+----------+------------------+ FV Distal                                             Not visualized     +---------+---------------+---------+-----------+----------+------------------+ PFV      Full                                                            +---------+---------------+---------+-----------+----------+------------------+ POP      Full           Yes      Yes                                     +---------+---------------+---------+-----------+----------+------------------+  PTV      Full                                         limited                                                                  visualization      +---------+---------------+---------+-----------+----------+------------------+ PERO     Full                                         limited                                                                  visualization       +---------+---------------+---------+-----------+----------+------------------+   +---------+---------------+---------+-----------+----------+------------------+ LEFT     CompressibilityPhasicitySpontaneityPropertiesThrombus Aging     +---------+---------------+---------+-----------+----------+------------------+ CFV      Full           Yes      Yes                                     +---------+---------------+---------+-----------+----------+------------------+ SFJ      Full                                                            +---------+---------------+---------+-----------+----------+------------------+ FV Prox  Full                                                            +---------+---------------+---------+-----------+----------+------------------+ FV Mid                  Yes      Yes                                     +---------+---------------+---------+-----------+----------+------------------+ FV Distal                                             Not visualized     +---------+---------------+---------+-----------+----------+------------------+ PFV      Full                                                            +---------+---------------+---------+-----------+----------+------------------+  POP      Full           Yes      Yes                                     +---------+---------------+---------+-----------+----------+------------------+ PTV      Full                                         limited                                                                  visualization      +---------+---------------+---------+-----------+----------+------------------+ PERO     Full                                         limited                                                                  visualization      +---------+---------------+---------+-----------+----------+------------------+     Summary: BILATERAL: -No  evidence of popliteal cyst, bilaterally. RIGHT: - There is no evidence of deep vein thrombosis in the lower extremity. However, portions of this examination were limited- see technologist comments above.  LEFT: - There is no evidence of deep vein thrombosis in the lower extremity. However, portions of this examination were limited- see technologist comments above.  *See table(s) above for measurements and observations. Electronically signed by Deitra Mayo MD on 07/24/2019 at 8:50:12 AM.    Final         Scheduled Meds: . amLODipine  10 mg Oral Daily  . aspirin  81 mg Oral Daily  . carbamazepine  400 mg Oral BID  . citalopram  20 mg Oral Daily  . ezetimibe  10 mg Oral Daily  . furosemide  40 mg Intravenous Q12H  . heparin  5,000 Units Subcutaneous Q8H  . hydrALAZINE  25 mg Oral Daily  . insulin aspart  0-5 Units Subcutaneous QHS  . insulin aspart  0-9 Units Subcutaneous TID WC  . loperamide  2 mg Oral Once  . mirtazapine  15 mg Oral QHS  . pantoprazole  40 mg Oral Daily  . simvastatin  40 mg Oral Daily  . sodium chloride flush  3 mL Intravenous Q12H   Continuous Infusions: . sodium chloride            Aline August, MD [facility] 07/25/2019, 11:23 AM

## 2019-07-25 NOTE — TOC Initial Note (Signed)
Transition of Care [facility]) - Initial/Assessment Note    Patient Details  Name: [name] MRN: [id number] Date of Birth: 05-Nov-1945  Transition of Care [facility]) CM/SW Contact:    Benard Halsted, LCSW Phone Number: 07/25/2019, 3:34 PM  Clinical Narrative:                 CSW received consult for possible SNF placement at time of discharge. CSW spoke with patient regarding PT recommendation of SNF placement at time of discharge. Patient expressed understanding of PT recommendation and may be agreeable to SNF placement at time of discharge if it is a different SNF than [facility]. She reports she would rather return home but lives alone with an aide that comes for a few hours in the afternoon. Patient requests CSW contact her daughter. CSW left voicemail. CSW discussed insurance authorization process and provided Medicare SNF ratings list. Patient reported she has not received COVID vaccines and is aware that she will be unable to have visitors at [facility].    Expected Discharge Plan: Skilled Nursing Facility Barriers to Discharge: Continued Medical Work up   Patient Goals and CMS Choice Patient states their goals for this hospitalization and ongoing recovery are:: Return home CMS Medicare.gov Compare Post Acute Care list provided to:: Patient Choice offered to / list presented to : Patient  Expected Discharge Plan and Services Expected Discharge Plan: [place] In-house Referral: Clinical Social Work   Post Acute Care Choice: [place] Living arrangements for the past 2 months: [place]                                      Prior Living Arrangements/Services Living arrangements for the past 2 months: Single Family Home Lives with:: Self Patient language and need for interpreter reviewed:: Yes Do you feel safe going back to the place where you live?: Yes      Need for Family Participation in Patient Care: No (Comment) Care giver  support system in place?: Yes (comment) Current home services: Homehealth aide, DME Criminal Activity/Legal Involvement Pertinent to Current Situation/Hospitalization: No - Comment as needed  Activities of Daily Living      Permission Sought/Granted Permission sought to share information with : Facility Sport and exercise psychologist, Family Supports Permission granted to share information with : Yes, Verbal Permission Granted  Share Information with NAME: Juliann Pulse  Permission granted to share info w AGENCY: SNFs  Permission granted to share info w Relationship: Daughter  Permission granted to share info w Contact Information: [phone]  Emotional Assessment Appearance:: Appears stated age Attitude/Demeanor/Rapport: Gracious Affect (typically observed): Accepting, Appropriate Orientation: : Oriented to Self, Oriented to Place, Oriented to  Time, Oriented to Situation Alcohol / Substance Use: Not Applicable Psych Involvement: No (comment)  Admission diagnosis:  Hypoxia [R09.02] Acute respiratory failure with hypoxia (HCC) [J96.01] Heart failure, unspecified HF chronicity, unspecified heart failure type [facility]) [I50.9] Patient Active Problem List   Diagnosis Date Noted  . Hyponatremia 07/22/2019  . Near syncope 07/22/2019  . Middle cerebral artery embolism, left 03/07/2019  . Chronic diastolic CHF (congestive heart failure) ([place]) 03/04/2019  . Acute respiratory failure with hypoxia ([place]) 05/02/2017  . Acute on chronic diastolic CHF (congestive heart failure) ([place]) 05/02/2017  . Seizures disorder 05/02/2017  . Diabetes mellitus type 2, insulin dependent ([place]) 05/02/2017  . Morbid obesity with BMI of 45.0-49.9, adult ([place]) 05/02/2017  . Anemia 05/02/2017  .  CKD (chronic kidney disease) stage 4, GFR 15-29 ml/min (HCC) 05/02/2017  . Dyslipidemia 05/02/2017  . Acute hyperkalemia 05/02/2017  . 1st degree AV block 05/02/2017  . Symptomatic bradycardia 11/27/2016  . DVT (deep venous  thrombosis) ([place]) 06/30/2015  . Chronic diastolic (congestive) heart failure ([place]) 06/30/2015  . Bradycardia with 31-40 beats per minute   . Emesis   . Junctional bradycardia   . Arterial embolism and thrombosis of upper extremity ([place])   . Bradycardia 12/11/2014  . Vomiting 12/11/2014  . Acute renal failure ([place]) 12/11/2014  . Acute renal failure superimposed on stage 4 chronic kidney disease ([place])   . Essential hypertension   . Type II diabetes mellitus ([place])   . OSA (obstructive sleep apnea) 10/17/2012  . Elevated troponin 09/26/2012  . Acute encephalopathy 09/25/2012  . Tremor 09/25/2012  . Rhabdomyolysis 09/25/2012  . Morbid obesity ([place]) 09/16/2006  . DEPRESSION, CHRONIC 09/16/2006  . Seizure ([place]) 09/16/2006  . Hyperlipidemia 09/09/2006  . CONSTIPATION, HX OF 09/09/2006   PCP:  Benito Mccreedy, MD Pharmacy:   [facility], [address] [place] [address] Phone: [phone] Fax: [phone]  [id number] - [place], [place] - [address] [place] [address] Phone: [phone] Fax: [phone]     Social Determinants of Health (SDOH) Interventions    Readmission Risk Interventions Readmission Risk Prevention Plan 07/25/2019  Transportation Screening Complete  PCP or Specialist Appt within 3-5 Days Complete  HRI or [place] Complete  Social Work Consult for [place] Planning/Counseling Complete  Palliative Care Screening Not Applicable  Medication Review Press photographer) Referral to Pharmacy  Some recent data might be hidden

## 2019-07-25 NOTE — Progress Notes (Signed)
Progress Note  Patient Name: [name] Date of Encounter: 07/25/2019  [facility] Cardiologist: Lauree Chandler, MD   Subjective   Shortness of breath improved.  Her main complaint is diarrhea.  Inpatient Medications    Scheduled Meds: . [START ON 07/26/2019] amLODipine  5 mg Oral Daily  . aspirin  81 mg Oral Daily  . carbamazepine  400 mg Oral BID  . citalopram  20 mg Oral Daily  . ezetimibe  10 mg Oral Daily  . [START ON 07/26/2019] furosemide  80 mg Oral Daily  . heparin  5,000 Units Subcutaneous Q8H  . hydrALAZINE  25 mg Oral Daily  . insulin aspart  0-5 Units Subcutaneous QHS  . insulin aspart  0-9 Units Subcutaneous TID WC  . mirtazapine  15 mg Oral QHS  . pantoprazole  40 mg Oral Daily  . simvastatin  40 mg Oral Daily  . sodium chloride flush  3 mL Intravenous Q12H   Continuous Infusions: . sodium chloride     PRN Meds: sodium chloride, acetaminophen, ondansetron (ZOFRAN) IV, sodium chloride flush   Vital Signs    Vitals:   07/25/19 0427 07/25/19 0728 07/25/19 1015 07/25/19 1152  BP: (!) 108/91 (!) 126/56 101/75 109/66  Pulse: 73 64 66 66  Resp: 18 16 18 16   Temp: 98.2 F (36.8 C) 98.2 F (36.8 C) 98.3 F (36.8 C) 98.1 F (36.7 C)  TempSrc: Oral Oral Oral Oral  SpO2: 100% 100% 99% 100%  Weight: 103.9 kg     Height:        Intake/Output Summary (Last 24 hours) at 07/25/2019 1719 Last data filed at 07/25/2019 1703 Gross per 24 hour  Intake 460 ml  Output 501 ml  Net -41 ml   Last 3 Weights 07/25/2019 07/23/2019 07/22/2019  Weight (lbs) 229 lb 0.9 oz 234 lb 12.6 oz 234 lb 12.6 oz  Weight (kg) 103.9 kg 106.5 kg 106.5 kg      Telemetry    Normal sinus rhythm- Personally Reviewed  ECG    A. fib on June 28  Physical Exam   GEN: No acute distress.   Neck: No JVD Cardiac: RRR, no murmurs, rubs, or gallops.  Respiratory: Clear to auscultation bilaterally. GI: Soft, nontender, non-distended  MS: No edema; No deformity. Neuro:   Nonfocal  Psych: Normal affect   Labs    High Sensitivity Troponin:   Recent Labs  Lab 07/23/19 1834 07/23/19 2148 07/24/19 0449 07/24/19 0834 07/24/19 1146  TROPONINIHS 54* 82* 101* 106* 96*      Chemistry Recent Labs  Lab 07/22/19 0519 07/23/19 1834 07/25/19 0800  NA 130* 128* 133*  K 4.5 5.3* 4.4  CL 97* 94* 98  CO2 23 19* 22  GLUCOSE 100* 239* 92  BUN 51* 63* 69*  CREATININE 2.83* 3.36* 3.10*  CALCIUM 8.7* 8.9 8.9  PROT  --  7.3  --   ALBUMIN  --  3.6  --   AST  --  18  --   ALT  --  17  --   ALKPHOS  --  89  --   BILITOT  --  0.9  --   GFRNONAA 16* 13* 14*  GFRAA 19* 15* 17*  ANIONGAP 10 15 13      Hematology Recent Labs  Lab 07/21/19 2316 07/23/19 1834 07/25/19 0800  WBC 5.5 11.6* 7.4  RBC 2.85* 3.09* 3.15*  HGB 8.2* 8.9* 9.3*  HCT 24.6* 26.8* 27.8*  MCV 86.3 86.7 88.3  MCH 28.8 28.8 29.5  MCHC 33.3 33.2 33.5  RDW 13.8 13.7 13.5  PLT 235 254 265    BNP Recent Labs  Lab 07/21/19 2316 07/23/19 1834  BNP 796.1* 3,173.5*     DDimer No results for input(s): DDIMER in the last 168 hours.   Radiology    DG Chest Portable 1 View  Result Date: 07/23/2019 CLINICAL DATA:  Fall, shortness of breath EXAM: PORTABLE CHEST 1 VIEW COMPARISON:  07/21/2019 FINDINGS: Cardiomegaly. Mild vascular congestion. No confluent opacities or effusions. No acute bony abnormality or pneumothorax. Loop recorder noted over the left chest. IMPRESSION: Cardiomegaly, vascular congestion. Electronically Signed   By: Rolm Baptise M.D.   On: 07/23/2019 18:46   ECHOCARDIOGRAM COMPLETE  Result Date: 07/24/2019    ECHOCARDIOGRAM REPORT   Patient Name:   [name] Date of Exam: 07/24/2019 Medical Rec #:  [id number]      Height:       62.0 in Accession #:    [id number]     Weight:       234.8 lb Date of Birth:  30-Jan-1945       BSA:          2.047 m Patient Age:    74 years       BP:           107/80 mmHg Patient Gender: F              HR:           72 bpm. Exam Location:   Inpatient Procedure: 2D Echo Indications:    acute diastolic chf 412.87  History:        Patient has prior history of Echocardiogram examinations, most                 recent 03/07/2019. CHF, chronic kidney disease,                 Signs/Symptoms:Shortness of Breath and elevated troponin; Risk                 Factors:Diabetes and Hypertension.  Sonographer:    Johny Chess Referring Phys: [id number] [place]  1. Left ventricular ejection fraction, by estimation, is 60 to 65%. The left ventricle has normal function. The left ventricle has no regional wall motion abnormalities. There is mild concentric left ventricular hypertrophy. Left ventricular diastolic parameters are consistent with Grade I diastolic dysfunction (impaired relaxation). Elevated left atrial pressure.  2. Right ventricular systolic function is mildly reduced. The right ventricular size is mildly enlarged. Mildly increased right ventricular wall thickness. There is mildly elevated pulmonary artery systolic pressure.  3. Left atrial size was moderately dilated.  4. Right atrial size was mildly dilated.  5. The mitral valve is normal in structure. No evidence of mitral valve regurgitation. No evidence of mitral stenosis.  6. The aortic valve is normal in structure. Aortic valve regurgitation is not visualized. No aortic stenosis is present.  7. The inferior vena cava is normal in size with greater than 50% respiratory variability, suggesting right atrial pressure of 3 mmHg. FINDINGS  Left Ventricle: Left ventricular ejection fraction, by estimation, is 60 to 65%. The left ventricle has normal function. The left ventricle has no regional wall motion abnormalities. The left ventricular internal cavity size was normal in size. There is  mild concentric left ventricular hypertrophy. Left ventricular diastolic parameters are consistent with Grade I diastolic dysfunction (impaired relaxation). Elevated left atrial pressure. Right  Ventricle: The right ventricular size is mildly enlarged. Mildly increased right ventricular wall thickness. Right ventricular systolic function is mildly reduced. There is mildly elevated pulmonary artery systolic pressure. The tricuspid regurgitant velocity is 2.76 m/s, and with an assumed right atrial pressure of 3 mmHg, the estimated right ventricular systolic pressure is 76.1 mmHg. Left Atrium: Left atrial size was moderately dilated. Right Atrium: Right atrial size was mildly dilated. Pericardium: There is no evidence of pericardial effusion. Mitral Valve: The mitral valve is normal in structure. Normal mobility of the mitral valve leaflets. No evidence of mitral valve regurgitation. No evidence of mitral valve stenosis. Tricuspid Valve: The tricuspid valve is normal in structure. Tricuspid valve regurgitation is not demonstrated. No evidence of tricuspid stenosis. Aortic Valve: The aortic valve is normal in structure. Aortic valve regurgitation is not visualized. No aortic stenosis is present. Pulmonic Valve: The pulmonic valve was normal in structure. Pulmonic valve regurgitation is not visualized. No evidence of pulmonic stenosis. Aorta: The aortic root is normal in size and structure. Venous: The inferior vena cava is normal in size with greater than 50% respiratory variability, suggesting right atrial pressure of 3 mmHg. IAS/Shunts: No atrial level shunt detected by color flow Doppler.  LEFT VENTRICLE PLAX 2D LVIDd:         3.90 cm  Diastology LVIDs:         2.80 cm  LV e' lateral:   6.00 cm/s LV PW:         1.20 cm  LV E/e' lateral: 14.8 LVOT diam:     2.10 cm  LV e' medial:    6.09 cm/s LV SV:         67       LV E/e' medial:  14.6 LV SV Index:   33 LVOT Area:     3.46 cm  RIGHT VENTRICLE            IVC RV S prime:     9.90 cm/s  IVC diam: 1.60 cm TAPSE (M-mode): 1.6 cm LEFT ATRIUM             Index       RIGHT ATRIUM           Index LA diam:        4.60 cm 2.25 cm/m  RA Area:     13.70 cm LA Vol  (A2C):   54.8 ml 26.78 ml/m RA Volume:   32.00 ml  15.64 ml/m LA Vol (A4C):   57.3 ml 28.00 ml/m LA Biplane Vol: 58.5 ml 28.58 ml/m  AORTIC VALVE LVOT Vmax:   93.30 cm/s LVOT Vmean:  57.100 cm/s LVOT VTI:    0.193 m  AORTA Ao Root diam: 2.80 cm Ao Asc diam:  2.70 cm MITRAL VALVE                TRICUSPID VALVE MV Area (PHT): 2.76 cm     TR Peak grad:   30.5 mmHg MV Decel Time: 275 msec     TR Vmax:        276.00 cm/s MV E velocity: 88.70 cm/s MV A velocity: 103.00 cm/s  SHUNTS MV E/A ratio:  0.86         Systemic VTI:  0.19 m                             Systemic Diam: 2.10 cm Dani Gobble Croitoru MD Electronically signed by Sanda Klein MD  Signature Date/Time: 07/24/2019/10:50:37 AM    Final    VAS [place] LOWER EXTREMITY VENOUS (DVT) (ONLY [facility] & [facility] 7a-7p)  Result Date: 07/24/2019  Lower Venous DVTStudy Indications: Edema.  Limitations: Body habitus and poor ultrasound/tissue interface. Comparison Study: 03/06/19 previous Performing Technologist: Abram Sander RVS  Examination Guidelines: A complete evaluation includes B-mode imaging, spectral Doppler, color Doppler, and power Doppler as needed of all accessible portions of each vessel. Bilateral testing is considered an integral part of a complete examination. Limited examinations for reoccurring indications may be performed as noted. The reflux portion of the exam is performed with the patient in reverse Trendelenburg.  +---------+---------------+---------+-----------+----------+------------------+ RIGHT    CompressibilityPhasicitySpontaneityPropertiesThrombus Aging     +---------+---------------+---------+-----------+----------+------------------+ CFV      Full           Yes      Yes                                     +---------+---------------+---------+-----------+----------+------------------+ SFJ      Full                                                            +---------+---------------+---------+-----------+----------+------------------+  FV Prox  Full                                                            +---------+---------------+---------+-----------+----------+------------------+ FV Mid                  Yes      Yes                                     +---------+---------------+---------+-----------+----------+------------------+ FV Distal                                             Not visualized     +---------+---------------+---------+-----------+----------+------------------+ PFV      Full                                                            +---------+---------------+---------+-----------+----------+------------------+ POP      Full           Yes      Yes                                     +---------+---------------+---------+-----------+----------+------------------+ PTV      Full  limited                                                                  visualization      +---------+---------------+---------+-----------+----------+------------------+ PERO     Full                                         limited                                                                  visualization      +---------+---------------+---------+-----------+----------+------------------+   +---------+---------------+---------+-----------+----------+------------------+ LEFT     CompressibilityPhasicitySpontaneityPropertiesThrombus Aging     +---------+---------------+---------+-----------+----------+------------------+ CFV      Full           Yes      Yes                                     +---------+---------------+---------+-----------+----------+------------------+ SFJ      Full                                                            +---------+---------------+---------+-----------+----------+------------------+ FV Prox  Full                                                             +---------+---------------+---------+-----------+----------+------------------+ FV Mid                  Yes      Yes                                     +---------+---------------+---------+-----------+----------+------------------+ FV Distal                                             Not visualized     +---------+---------------+---------+-----------+----------+------------------+ PFV      Full                                                            +---------+---------------+---------+-----------+----------+------------------+ POP      Full  Yes      Yes                                     +---------+---------------+---------+-----------+----------+------------------+ PTV      Full                                         limited                                                                  visualization      +---------+---------------+---------+-----------+----------+------------------+ PERO     Full                                         limited                                                                  visualization      +---------+---------------+---------+-----------+----------+------------------+     Summary: BILATERAL: -No evidence of popliteal cyst, bilaterally. RIGHT: - There is no evidence of deep vein thrombosis in the lower extremity. However, portions of this examination were limited- see technologist comments above.  LEFT: - There is no evidence of deep vein thrombosis in the lower extremity. However, portions of this examination were limited- see technologist comments above.  *See table(s) above for measurements and observations. Electronically signed by Deitra Mayo MD on 07/24/2019 at 8:50:12 AM.    Final     Cardiac Studies   LVEF 60 to 65% in June 2021, mildly decreased RV systolic function  Patient Profile     74 y.o. female   Assessment & Plan    Acute on chronic diastolic heart failure: She has  responded well to diuresis.  Follow renal function given her significant CKD, stage IV.  No indication for pacemaker.  Intermittent atrial fibrillation.      For questions or updates, please contact [place] Please consult [email or web address] for contact info under        Signed, Larae Grooms, MD  07/25/2019, 5:19 PM

## 2019-07-25 NOTE — Consult Note (Signed)
[facility] Renal Consultation Note  Requesting MD: Aline August MD Indication for Consultation:  CKD   Chief complaint: shortness of breath  PCP Benito Mccreedy, MD - [facility] Cardiologist: Lauree Chandler, MD   HPI:  [name] is a 74 y.o. female with a history including DM, HTN, chronic diastolic CHF, obesity, OSA, hx CVA, GERD, and depression who presented to the hospital with shortness of breath and after a fall.  She had been admitted 6/27 after another fall at home felt secondary to loss of balance per charting; no loss of consciousness.  Per charting lasix was held and then discontinued on discharge.  She presented 6/28 with shortness of breath and was saturating in 70's on room air per charting.  She has gotten lasix 40 mg IV on 6/29 and 6/30.  States breathing is better now .  Note that she recently re-established care with nephrology.  I saw her in clinic on 06/21/19 and she was previously seen by Dr. Florene Glen in 2017.  At her recent nephro appt we asked that she reduce lasix from 80 mg BID to 80 mg daily (though note there was a discrepancy between the recorded dose of lasix 40 mg daily per cards/epic med list and the 80 mg BID on both her medication bottle brought to the appt, PCP med list, and she had reported that as her home regimen).  She confirms that she's been on lasix 80 mg daily at home (not BID) prior to her fall this week.  She does have trouble with coming up with medication doses though.  Her Cr was 2.67 on 06/21/19.  Her oxygen sat is 100% on 2 liters.  Other/Clinic hx  Recent baseline Cr 3-3.4. See trends below. She was seen by Dr. Posey Pronto and Dr. Marval Regal inpatient for AKI on CKD stage IV in 02/2019 felt to be hemodynamically related in the setting of decompensated CHF, hypotension, and diuresis. She had the 02/2019 admission with preceding falls and right subdural hematoma and then code stroke inpatient with separate  M1 occlusion and s/p mechanical thrombectomy. She states that she has had DM for at least 40 years. She's not sure how long she has had HTN. She denies any NSAID use but previously took NSAIDs as needed before being told to stop when saw Dr. Florene Glen. She denies any period of heavy use. She denies any family history of CKD or ESRD. Per cardiology/epic med list had been on lasix 40 mg daily and her bottle she gave [place] at her appt and PCP med list both state lasix 80 mg BID and she stated she has been on lasix BID for quite some time. She has vascular disease and a hx of brachial artery thrombectomy in addition to the CVA noted above. Note she had AKI in 11/2014 with peak Cr 4. Note also history of severe bradycardia in setting of metoprolol.    Prior Labs: 06/21/19 - Cr was 2.67, PTH 158 03/14/19 - Cr 3.16, Hb 7.9 03/11/19 - Cr 4.69 03/02/19 - Cr 3.43 04/2017 - Cr 3  Recent baseline Cr charted as 3-3.4  Imaging: 2/201 - TTE LVEF 06-23% diastolic function couldn't be assessed 11/2016 - renal US - right kidney 10.1 cm and left 9.7; normal echo no hydro   Creatinine, Ser  Date/Time Value Ref Range Status  07/25/2019 08:00 AM 3.10 (H) 0.44 - 1.00 mg/dL Final  07/23/2019 06:34 PM 3.36 (H) 0.44 - 1.00 mg/dL Final  07/22/2019 05:19 AM  2.83 (H) 0.44 - 1.00 mg/dL Final  07/21/2019 11:16 PM 2.97 (H) 0.44 - 1.00 mg/dL Final  03/14/2019 02:53 AM 3.16 (H) 0.44 - 1.00 mg/dL Final  03/13/2019 02:17 AM 3.80 (H) 0.44 - 1.00 mg/dL Final  03/12/2019 05:04 AM 4.30 (H) 0.44 - 1.00 mg/dL Final  03/11/2019 04:55 AM 4.69 (H) 0.44 - 1.00 mg/dL Final  03/10/2019 03:07 AM 4.00 (H) 0.44 - 1.00 mg/dL Final  03/09/2019 10:46 PM 3.90 (H) 0.44 - 1.00 mg/dL Final  03/09/2019 05:09 PM 3.69 (H) 0.44 - 1.00 mg/dL Final  03/09/2019 08:54 AM 3.27 (H) 0.44 - 1.00 mg/dL Final  03/08/2019 05:45 AM 2.51 (H) 0.44 - 1.00 mg/dL Final  03/07/2019 05:42 AM 3.17 (H) 0.44 - 1.00 mg/dL Final  03/06/2019 08:46 AM 3.34 (H) 0.44 - 1.00  mg/dL Final  03/05/2019 04:05 AM 3.53 (H) 0.44 - 1.00 mg/dL Final  03/04/2019 03:57 PM 3.45 (H) 0.44 - 1.00 mg/dL Final  03/04/2019 09:54 AM 3.65 (H) 0.44 - 1.00 mg/dL Final  03/02/2019 08:55 AM 3.43 (H) 0.44 - 1.00 mg/dL Final  05/06/2017 03:33 AM 3.27 (H) 0.44 - 1.00 mg/dL Final  05/05/2017 03:43 AM 3.12 (H) 0.44 - 1.00 mg/dL Final  05/04/2017 02:31 AM 3.02 (H) 0.44 - 1.00 mg/dL Final  05/03/2017 02:37 AM 2.95 (H) 0.44 - 1.00 mg/dL Final  05/02/2017 06:36 PM 2.88 (H) 0.44 - 1.00 mg/dL Final  05/02/2017 02:31 PM 2.89 (H) 0.44 - 1.00 mg/dL Final  11/30/2016 06:42 AM 2.78 (H) 0.44 - 1.00 mg/dL Final  11/29/2016 04:45 AM 3.39 (H) 0.44 - 1.00 mg/dL Final  11/28/2016 03:09 AM 2.81 (H) 0.44 - 1.00 mg/dL Final  11/27/2016 06:34 PM 2.49 (H) 0.44 - 1.00 mg/dL Final  11/07/2016 04:55 PM 2.22 (H) 0.44 - 1.00 mg/dL Final  07/02/2015 02:15 AM 3.12 (H) 0.44 - 1.00 mg/dL Final  07/01/2015 03:17 AM 2.43 (H) 0.44 - 1.00 mg/dL Final  06/30/2015 06:52 AM 2.46 (H) 0.44 - 1.00 mg/dL Final  12/21/2014 04:03 AM 3.12 (H) 0.44 - 1.00 mg/dL Final  12/20/2014 02:58 AM 3.23 (H) 0.44 - 1.00 mg/dL Final  12/19/2014 03:59 AM 3.53 (H) 0.44 - 1.00 mg/dL Final  12/18/2014 03:28 AM 3.78 (H) 0.44 - 1.00 mg/dL Final  12/17/2014 06:08 AM 3.94 (H) 0.44 - 1.00 mg/dL Final  12/16/2014 04:44 AM 3.24 (H) 0.44 - 1.00 mg/dL Final  12/15/2014 08:00 AM 2.74 (H) 0.44 - 1.00 mg/dL Final  12/13/2014 06:12 AM 3.35 (H) 0.44 - 1.00 mg/dL Final  12/12/2014 12:42 AM 3.66 (H) 0.44 - 1.00 mg/dL Final  12/11/2014 01:05 PM 3.74 (H) 0.44 - 1.00 mg/dL Final  12/11/2014 08:35 AM 4.02 (H) 0.44 - 1.00 mg/dL Final  11/14/2014 08:50 PM 2.53 (H) 0.44 - 1.00 mg/dL Final  10/08/2014 03:49 PM 2.05 (H) 0.44 - 1.00 mg/dL Final  09/24/2014 06:10 PM 1.95 (H) 0.44 - 1.00 mg/dL Final  07/24/2014 09:00 PM 2.21 (H) 0.44 - 1.00 mg/dL Final  10/02/2012 09:05 AM 1.32 (H) 0.50 - 1.10 mg/dL Final  09/28/2012 03:55 AM 1.38 (H) 0.50 - 1.10 mg/dL Final  09/26/2012  04:50 AM 1.43 (H) 0.50 - 1.10 mg/dL Final  09/25/2012 05:00 PM 1.64 (H) 0.50 - 1.10 mg/dL Final     PMHx:   Past Medical History:  Diagnosis Date  . 1st degree AV block 05/02/2017  . Anemia   . Arterial embolism and thrombosis of upper extremity ([place])   . Chronic diastolic (congestive) heart failure ([place])    a. 04/2017 Echo: EF 60-65%. Gr1  DD. Mod LVH.   . CKD (chronic kidney disease) stage 4, GFR 15-29 ml/min ([facility]) 05/02/2017  . Depression   . Diabetes mellitus type 2, insulin dependent ([place]) 05/02/2017  . DVT (deep venous thrombosis) ([place])   . Dyslipidemia 05/02/2017  . Essential hypertension   . GERD (gastroesophageal reflux disease)   . Hyperlipidemia   . Junctional bradycardia   . Morbid obesity with BMI of 45.0-49.9, adult ([place]) 05/02/2017  . OSA (obstructive sleep apnea) 10/17/2012  . Rhabdomyolysis 09/25/2012  . Seizures ([place])    "epilepsy; they think I had one a couple months ago" (12/11/2014)  . Symptomatic bradycardia 11/27/2016    Past Surgical History:  Procedure Laterality Date  . implantable loop recorder  06/06/2019    Medtronic Reveal Linq model LNQ 11 SN [id number] S implantable loop recorder implanted in office by Dr Rayann Heman for cryptogenic stroke  . IR CT HEAD LTD  03/07/2019  . IR PERCUTANEOUS ART THROMBECTOMY/INFUSION INTRACRANIAL INC DIAG ANGIO  03/07/2019  . RADIOLOGY WITH ANESTHESIA N/A 03/07/2019   Procedure: IR WITH ANESTHESIA;  Surgeon: Radiologist, Medication, MD;  Location: [facility];  Service: Radiology;  Laterality: N/A;  . THROMBECTOMY BRACHIAL ARTERY Left 12/15/2014   Procedure: THROMBECTOMY BRACHIAL ARTERY; VEIN PATCH ANGIOPLASTY LEFT BRACIAL ARTERY;  Surgeon: Angelia Mould, MD;  Location: [facility];  Service: Vascular;  Laterality: Left;  . TUBAL LIGATION      Family Hx:  Family History  Problem Relation Age of Onset  . Hypertension Mother        died @ 56 - unknown cause.  [name] Hypertension Father        says she doesn't know anything about her father.   . Cancer Maternal Aunt   . Seizures Neg Hx     Social History:  reports that she has never smoked. She has never used smokeless tobacco. She reports that she does not drink alcohol and does not use drugs.  Allergies:  Allergies  Allergen Reactions  . Codeine Nausea And Vomiting  . Metoprolol Other (See Comments)    Severe bradycardia.  Now getting admitted for 3rd time in 3 years for bradycardia onset after being started on toprol.  Please dont put patient on this med anymore!    Medications: Prior to Admission medications   Medication Sig Start Date End Date Taking? Authorizing Provider  amLODipine (NORVASC) 10 MG tablet Take 10 mg by mouth daily. 05/05/19  Yes [provider]  aspirin 81 MG chewable tablet Chew 1 tablet (81 mg total) by mouth daily. 12/21/14  Yes Rai, Ripudeep K, MD  carbamazepine (TEGRETOL) 200 MG tablet Take 400 mg by mouth 2 (two) times daily.    Yes [provider]  cetirizine (ZYRTEC) 10 MG tablet Take 10 mg by mouth daily. 07/19/19  Yes [provider]  citalopram (CELEXA) 20 MG tablet Take 20 mg by mouth daily.   Yes [provider]  ezetimibe-simvastatin (VYTORIN) 10-40 MG per tablet Take 1 tablet by mouth daily.   Yes [provider]  glipiZIDE (GLUCOTROL XL) 5 MG 24 hr tablet Take 5 mg by mouth daily with breakfast.  03/24/18  Yes [provider]  HUMULIN 70/30 KWIKPEN (70-30) 100 UNIT/ML PEN Inject 5 Units into the skin 2 (two) times daily. 05/06/17  Yes Aline August, MD  hydrALAZINE (APRESOLINE) 25 MG tablet Take 25 mg by mouth daily.  05/13/19  Yes [provider]  omeprazole (PRILOSEC) 40 MG capsule Take 40 mg by mouth daily.  Yes [provider]  Vitamin D, Ergocalciferol, (DRISDOL) 1.25 MG (50000 UNIT) CAPS capsule Take 50,000 Units by mouth once a week. 07/09/19  Yes [provider]  ACCU-CHEK AVIVA PLUS test strip 3 (three) times daily. for testing 04/14/18   [provider]  Accu-Chek Softclix Lancets lancets See admin instructions. 12/29/17   [provider]  Ascorbic Acid (VITAMIN C PO) Take 1 tablet by mouth daily.    [provider]  B-D ULTRAFINE III SHORT PEN 31G X 8 MM MISC See admin instructions. 04/18/18   [provider]  mirtazapine (REMERON) 15 MG tablet Take 15 mg by mouth at bedtime.  05/06/18   [provider]    I have reviewed the patient's current medications.  Labs:  BMP Latest Ref Rng & Units 07/25/2019 07/23/2019 07/22/2019  Glucose 70 - 99 mg/dL 92 239(H) 100(H)  BUN 8 - 23 mg/dL 69(H) 63(H) 51(H)  Creatinine 0.44 - 1.00 mg/dL 3.10(H) 3.36(H) 2.83(H)  Sodium 135 - 145 mmol/L 133(L) 128(L) 130(L)  Potassium 3.5 - 5.1 mmol/L 4.4 5.3(H) 4.5  Chloride 98 - 111 mmol/L 98 94(L) 97(L)  CO2 22 - 32 mmol/L 22 19(L) 23  Calcium 8.9 - 10.3 mg/dL 8.9 8.9 8.7(L)    Urinalysis    Component Value Date/Time   COLORURINE YELLOW 07/21/2019 2346   APPEARANCEUR CLEAR 07/21/2019 2346   LABSPEC 1.006 07/21/2019 2346   PHURINE 5.0 07/21/2019 2346   GLUCOSEU >=500 (A) 07/21/2019 2346   HGBUR NEGATIVE 07/21/2019 2346   [place] 07/21/2019 [place] 07/21/2019 2346   PROTEINUR NEGATIVE 07/21/2019 2346   UROBILINOGEN 0.2 11/14/2014 1946   NITRITE NEGATIVE 07/21/2019 2346   LEUKOCYTESUR NEGATIVE 07/21/2019 2346    ROS:  Pertinent items noted in HPI and remainder of comprehensive ROS otherwise negative.    Physical Exam: Vitals:   07/25/19 1015 07/25/19 1152  BP: 101/75 109/66  Pulse: 66 66  Resp: 18 16  Temp: 98.3 F (36.8 C) 98.1 F (36.7 C)  SpO2: 99% 100%     General:  Adult female in bed in NAD HEENT: NCAT Eyes: EOMI sclera anicteric Neck: supple trachea midline  Heart: S1S2 no rub Lungs: reduced breath sounds; unlabored Abdomen: soft/NT/obese habitus Extremities: no pitting edema  Skin: no rash on extremities exposed Neuro: alert and oriented x 3;  provides hx and follows commands Psych normal mood and affect  Assessment/Plan:  # CKD stage IV - Secondary to diabetic nephropathy and microvascular disease from HTN - Continue with lasix from a renal standpoint - she had been on lasix 80 mg daily prior to her presentations this week.   - Will resume lasix 80 mg PO daily for tomorrow.  Discontinue IV lasix for now.  Note discrepancy with epic med rec and her reported home dose to me in clinic based on her physical medication bottles/med review with our clinic.   - She is near her recent renal baseline  - change to renal diabetic diet   # Acute on Chronic diastolic CHF - Lasix as above    # Acute hypoxic resp failure - Was felt 2/2 fluid overload/CHF - optimize volume status with lasix   # HTN with CKD - Note hypotension  - Would reduce amlodipine to 5 mg daily for now given hypotension (she did get 10 mg on 6/30)   # Anemia CKD - Check iron stores - anticipate may need ESA soon    # Bradycardia - avoid beta  blockers - followed by EP  # Metabolic bone disease/Secondary hyperpara  - Will follow trends outpatient after high dose weekly vit D. Phos in AM   Claudia Desanctis 07/25/2019, 3:15 PM

## 2019-07-25 NOTE — Evaluation (Signed)
Physical Therapy Evaluation Patient Details Name: [name] MRN: [id number] DOB: 01/21/1946 Today's Date: 07/25/2019   History of Present Illness  74 year old female with history of hypertension, anemia of chronic disease, chronic kidney disease stage IV, diabetes mellitus type 2, hypertension, obesity, seizure disorder, recent hospitalization and discharge on 07/22/2019 for near syncope during which her diuretics were held by cardiology presented with increasing exertional dyspnea with ongoing dizziness.  Clinical Impression  Patient presents with decreased mobility due to weakness, decreased balance, decreased activity tolerance, decreased safety awareness and history of recent falls/syncope.  She was orthostatic with testing today and somewhat symptomatic, though continued to deny pre-syncope, instead reported weakness.  Feel she will benefit from skilled PT in the acute setting and follow up STSNF level rehab at d/c.   Orthostatic VS for the past 24 hrs (Last 3 readings):  BP- Lying Pulse- Lying BP- Sitting Pulse- Sitting BP- Standing at 0 minutes Pulse- Standing at 0 minutes  07/25/19 0900 123/69 89 133/63 91 (!) 111/95 76  Additionally BP upon return from ambulation 80/54, repeated 101/63.  SpO2 87% on RA prior to ambulation so kept on 2L O2 with SpO2 93%.     Follow Up Recommendations SNF    Equipment Recommendations  None recommended by PT    Recommendations for Other Services       Precautions / Restrictions Precautions Precautions: Fall Precaution Comments: watch BP, SpO2 Restrictions Weight Bearing Restrictions: No      Mobility  Bed Mobility Overal bed mobility: Needs Assistance Bed Mobility: Supine to Sit     Supine to sit: HOB elevated;Min assist     General bed mobility comments: pulled up on rail and with PT assist  Transfers Overall transfer level: Needs assistance Equipment used: Rolling walker (2 wheeled) Transfers: Sit to/from Stand Sit to Stand:  Min assist         General transfer comment: increased time, assist for balance  Ambulation/Gait Ambulation/Gait assistance: Min guard;Min assist Gait Distance (Feet): 60 Feet (&15') Assistive device: Rolling walker (2 wheeled) Gait Pattern/deviations: Step-to pattern;Step-through pattern;Decreased stride length;Trunk flexed;Shuffle     General Gait Details: cues for posture, assist for balance, safety, pt fearful of falling; in hallway then sat on bed in room with BP measurements, then up to walk around bed to chair  Stairs            Wheelchair Mobility    Modified Rankin (Stroke Patients Only)       Balance Overall balance assessment: Needs assistance Sitting-balance support: Single extremity supported;Feet supported Sitting balance-Leahy Scale: Fair     Standing balance support: Bilateral upper extremity supported Standing balance-Leahy Scale: Poor Standing balance comment: needs support for balance                             Pertinent Vitals/Pain Pain Assessment: No/denies pain    Home Living Family/patient expects to be discharged to:: Private residence Living Arrangements: Alone Available Help at Discharge: Available PRN/intermittently;Personal care attendant Type of Home: Apartment Home Access: Level entry     Home Layout: One level Home Equipment: Walker - 2 wheels;Bedside commode;Shower seat;Grab bars - tub/shower;Wheelchair - manual Additional Comments: aide 7 days a week, 4 hours a day, daughter gets groceries, takes med transport to appointments    Prior Function Level of Independence: Needs assistance   Gait / Transfers Assistance Needed: Uses RW at baseline. Does not drive.  ADL's / Homemaking Assistance Needed: aide  helps when she showers  Comments: 2 falls in past month, about 3 falls in past year     Hand Dominance   Dominant Hand: Right    Extremity/Trunk Assessment   Upper Extremity Assessment Upper Extremity  Assessment: Generalized weakness    Lower Extremity Assessment Lower Extremity Assessment: Generalized weakness (peripheral neuropathy)       Communication   Communication: No difficulties  Cognition Arousal/Alertness: Awake/alert Behavior During Therapy: WFL for tasks assessed/performed Overall Cognitive Status: No family/caregiver present to determine baseline cognitive functioning Area of Impairment: Orientation;Following commands;Problem solving;Attention                 Orientation Level: Disoriented to;Time (kept stating it was 2001) Current Attention Level: Sustained   Following Commands: Follows one step commands consistently;Follows one step commands with increased time     Problem Solving: Slow processing        General Comments General comments (skin integrity, edema, etc.): noted pt fatigued after short distance ambulation BP upon return from ambulation 80/54, repeat measurement 101/63    Exercises     Assessment/Plan    PT Assessment Patient needs continued PT services  PT Problem List Decreased strength;Decreased mobility;Decreased safety awareness;Decreased activity tolerance;Decreased balance;Decreased knowledge of use of DME;Decreased cognition       PT Treatment Interventions DME instruction;Therapeutic activities;Gait training;Therapeutic exercise;Patient/family education;Balance training;Functional mobility training    PT Goals (Current goals can be found in the Care Plan section)  Acute Rehab PT Goals Patient Stated Goal: agreeable to STSNF PT Goal Formulation: With patient Time For Goal Achievement: 08/08/19 Potential to Achieve Goals: Good    Frequency Min 3X/week   Barriers to discharge Decreased caregiver support      Co-evaluation               AM-PAC PT "6 Clicks" Mobility  Outcome Measure Help needed turning from your back to your side while in a flat bed without using bedrails?: A Little Help needed moving from lying  on your back to sitting on the side of a flat bed without using bedrails?: A Little Help needed moving to and from a bed to a chair (including a wheelchair)?: A Little Help needed standing up from a chair using your arms (e.g., wheelchair or bedside chair)?: A Little Help needed to walk in hospital room?: A Little Help needed climbing 3-5 steps with a railing? : A Lot 6 Click Score: 17    End of Session Equipment Utilized During Treatment: Gait belt;Oxygen Activity Tolerance: Treatment limited secondary to medical complications (Comment);Patient limited by fatigue (decr BP) Patient left: in chair;with call bell/phone within reach;with chair alarm set;with nursing/sitter in room   PT Visit Diagnosis: Other abnormalities of gait and mobility (R26.89);Muscle weakness (generalized) (M62.81);History of falling (Z91.81)    Time: 1610-9604 PT Time Calculation (min) (ACUTE ONLY): 42 min   Charges:   PT Evaluation $PT Eval Moderate Complexity: 1 Mod PT Treatments $Gait Training: 8-22 mins $Therapeutic Activity: 8-22 mins        Magda Kiel, PT Acute Rehabilitation Services [id number]:[phone] Office:[phone] 07/25/2019   Reginia Naas 07/25/2019, 1:46 PM

## 2019-07-25 NOTE — NC FL2 (Signed)
[place] LEVEL OF CARE SCREENING TOOL     IDENTIFICATION  Patient Name: [name] Birthdate: 09-Aug-1945 Sex: female Admission Date (Current Location): 07/23/2019  [facility] and [place] Number:  Herbalist and Address:  [facility], [place] [address], [place], [place] [address]      Provider Number: [id number]  Attending Physician Name and Address:  Aline August, MD  Relative Name and Phone Number:  Juliann Pulse, daughter, [phone]    Current Level of Care: Hospital Recommended Level of Care: [place] Prior Approval Number:    Date Approved/Denied:   PASRR Number: [id number]  Discharge Plan: SNF    Current Diagnoses: Patient Active Problem List   Diagnosis Date Noted   Hyponatremia 07/22/2019   Near syncope 07/22/2019   Middle cerebral artery embolism, left 03/07/2019   Chronic diastolic CHF (congestive heart failure) ([place]) 03/04/2019   Acute respiratory failure with hypoxia ([place]) 05/02/2017   Acute on chronic diastolic CHF (congestive heart failure) ([place]) 05/02/2017   Seizures disorder 05/02/2017   Diabetes mellitus type 2, insulin dependent ([place]) 05/02/2017   Morbid obesity with BMI of 45.0-49.9, adult ([place]) 05/02/2017   Anemia 05/02/2017   CKD (chronic kidney disease) stage 4, GFR 15-29 ml/min (HCC) 05/02/2017   Dyslipidemia 05/02/2017   Acute hyperkalemia 05/02/2017   1st degree AV block 05/02/2017   Symptomatic bradycardia 11/27/2016   DVT (deep venous thrombosis) (HCC) 06/30/2015   Chronic diastolic (congestive) heart failure (HCC) 06/30/2015   Bradycardia with 31-40 beats per minute    Emesis    Junctional bradycardia    Arterial embolism and thrombosis of upper extremity (HCC)    Bradycardia 12/11/2014   Vomiting 12/11/2014   Acute renal failure ([place]) 12/11/2014   Acute renal failure superimposed on stage 4 chronic kidney disease (HCC)    Essential hypertension     Type II diabetes mellitus (HCC)    OSA (obstructive sleep apnea) 10/17/2012   Elevated troponin 09/26/2012   Acute encephalopathy 09/25/2012   Tremor 09/25/2012   Rhabdomyolysis 09/25/2012   Morbid obesity ([place]) 09/16/2006   DEPRESSION, CHRONIC 09/16/2006   Seizure ([place]) 09/16/2006   Hyperlipidemia 09/09/2006   CONSTIPATION, HX OF 09/09/2006    Orientation RESPIRATION BLADDER Height & Weight     Self, Situation, Time, Place  O2 (Nasal cannula 2L) Incontinent, External catheter Weight: 229 lb 0.9 oz (103.9 kg) Height:  5\' 2"  (157.5 cm)  BEHAVIORAL SYMPTOMS/MOOD NEUROLOGICAL BOWEL NUTRITION STATUS      Continent Diet (Please see DC Summary)  AMBULATORY STATUS COMMUNICATION OF NEEDS Skin   Limited Assist Verbally Normal                       Personal Care Assistance Level of Assistance  Bathing, Feeding, Dressing Bathing Assistance: Limited assistance Feeding assistance: Limited assistance Dressing Assistance: Limited assistance     Functional Limitations Info  Sight, Hearing, Speech Sight Info: Adequate Hearing Info: Adequate Speech Info: Adequate    SPECIAL CARE FACTORS FREQUENCY  PT (By licensed PT), OT (By licensed OT)     PT Frequency: 5x/week OT Frequency: 5x/week            Contractures Contractures Info: Not present    Additional Factors Info  Code Status, Allergies, Isolation Precautions Code Status Info: Full Allergies Info: Codeine, Metoprolol     Isolation Precautions Info: Enteric precautions     Current Medications (07/25/2019):  This is the current hospital active medication  list Current Facility-Administered Medications  Medication Dose Route Frequency Provider Last Rate Last Admin   0.9 %  sodium chloride infusion  250 mL Intravenous PRN Opyd, Ilene Qua, MD       acetaminophen (TYLENOL) tablet 650 mg  650 mg Oral Q4H PRN Opyd, Ilene Qua, MD       [START ON 07/26/2019] amLODipine (NORVASC) tablet 5 mg  5 mg Oral Daily  Claudia Desanctis, MD       aspirin chewable tablet 81 mg  81 mg Oral Daily Opyd, Ilene Qua, MD   81 mg at 07/25/19 1019   carbamazepine (TEGRETOL) tablet 400 mg  400 mg Oral BID Opyd, Ilene Qua, MD   400 mg at 07/25/19 1019   citalopram (CELEXA) tablet 20 mg  20 mg Oral Daily Opyd, Ilene Qua, MD   20 mg at 07/25/19 1020   ezetimibe (ZETIA) tablet 10 mg  10 mg Oral Daily Kris Mouton, RPH   10 mg at 07/25/19 1019   [START ON 07/26/2019] furosemide (LASIX) tablet 80 mg  80 mg Oral Daily Claudia Desanctis, MD       heparin injection 5,000 Units  5,000 Units Subcutaneous Q8H Vianne Bulls, MD   5,000 Units at 07/25/19 0546   hydrALAZINE (APRESOLINE) tablet 25 mg  25 mg Oral Daily Opyd, Ilene Qua, MD   25 mg at 07/25/19 1029   insulin aspart (novoLOG) injection 0-5 Units  0-5 Units Subcutaneous QHS Opyd, Ilene Qua, MD       insulin aspart (novoLOG) injection 0-9 Units  0-9 Units Subcutaneous TID WC Opyd, Ilene Qua, MD   1 Units at 07/25/19 1206   loperamide (IMODIUM) capsule 2 mg  2 mg Oral Once [place] Shi, MD       mirtazapine (REMERON) tablet 15 mg  15 mg Oral QHS Opyd, Ilene Qua, MD   15 mg at 07/24/19 2148   ondansetron (ZOFRAN) injection 4 mg  4 mg Intravenous Q6H PRN Opyd, Ilene Qua, MD       pantoprazole (PROTONIX) EC tablet 40 mg  40 mg Oral Daily Opyd, Ilene Qua, MD   40 mg at 07/25/19 1019   simvastatin (ZOCOR) tablet 40 mg  40 mg Oral Daily Kris Mouton, RPH   40 mg at 07/25/19 1020   sodium chloride flush (NS) 0.9 % injection 3 mL  3 mL Intravenous Q12H Opyd, Ilene Qua, MD   3 mL at 07/25/19 1021   sodium chloride flush (NS) 0.9 % injection 3 mL  3 mL Intravenous PRN Opyd, Ilene Qua, MD         Discharge Medications: Please see discharge summary for a list of discharge medications.  Relevant Imaging Results:  Relevant Lab Results:   Additional Information SSN: [id number]                Has not received COVID vaccines  Benard Halsted, LCSW

## 2019-07-26 LAB — CBC WITH DIFFERENTIAL/PLATELET
Abs Immature Granulocytes: 0.04 10*3/uL (ref 0.00–0.07)
Basophils Absolute: 0 10*3/uL (ref 0.0–0.1)
Basophils Relative: 0 %
Eosinophils Absolute: 0 10*3/uL (ref 0.0–0.5)
Eosinophils Relative: 0 %
HCT: 26.3 % — ABNORMAL LOW (ref 36.0–46.0)
Hemoglobin: 8.7 g/dL — ABNORMAL LOW (ref 12.0–15.0)
Immature Granulocytes: 1 %
Lymphocytes Relative: 6 %
Lymphs Abs: 0.5 10*3/uL — ABNORMAL LOW (ref 0.7–4.0)
MCH: 29 pg (ref 26.0–34.0)
MCHC: 33.1 g/dL (ref 30.0–36.0)
MCV: 87.7 fL (ref 80.0–100.0)
Monocytes Absolute: 0.9 10*3/uL (ref 0.1–1.0)
Monocytes Relative: 11 %
Neutro Abs: 6.6 10*3/uL (ref 1.7–7.7)
Neutrophils Relative %: 82 %
Platelets: 274 10*3/uL (ref 150–400)
RBC: 3 MIL/uL — ABNORMAL LOW (ref 3.87–5.11)
RDW: 13.3 % (ref 11.5–15.5)
WBC: 8 10*3/uL (ref 4.0–10.5)
nRBC: 0 % (ref 0.0–0.2)

## 2019-07-26 LAB — GLUCOSE, CAPILLARY
Glucose-Capillary: 168 mg/dL — ABNORMAL HIGH (ref 70–99)
Glucose-Capillary: 150 mg/dL — ABNORMAL HIGH (ref 70–99)
Glucose-Capillary: 193 mg/dL — ABNORMAL HIGH (ref 70–99)
Glucose-Capillary: 248 mg/dL — ABNORMAL HIGH (ref 70–99)

## 2019-07-26 LAB — BASIC METABOLIC PANEL
Anion gap: 13 (ref 5–15)
BUN: 67 mg/dL — ABNORMAL HIGH (ref 8–23)
CO2: 21 mmol/L — ABNORMAL LOW (ref 22–32)
Calcium: 8.7 mg/dL — ABNORMAL LOW (ref 8.9–10.3)
Chloride: 100 mmol/L (ref 98–111)
Creatinine, Ser: 2.9 mg/dL — ABNORMAL HIGH (ref 0.44–1.00)
GFR calc Af Amer: 18 mL/min — ABNORMAL LOW (ref >60)
GFR calc non Af Amer: 16 mL/min — ABNORMAL LOW (ref >60)
Glucose, Bld: 158 mg/dL — ABNORMAL HIGH (ref 70–99)
Potassium: 4.6 mmol/L (ref 3.5–5.1)
Sodium: 134 mmol/L — ABNORMAL LOW (ref 135–145)

## 2019-07-26 LAB — SARS CORONAVIRUS 2 BY RT PCR (HOSPITAL ORDER, PERFORMED IN ~~LOC~~ HOSPITAL LAB): SARS Coronavirus 2: NEGATIVE

## 2019-07-26 LAB — PHOSPHORUS: Phosphorus: 3.9 mg/dL (ref 2.5–4.6)

## 2019-07-26 LAB — MAGNESIUM: Magnesium: 2.3 mg/dL (ref 1.7–2.4)

## 2019-07-26 MED ORDER — SODIUM CHLORIDE 0.9 % IV SOLN
510.0000 mg | Freq: Once | INTRAVENOUS | Status: AC
  Administered 2019-07-26: 510 mg via INTRAVENOUS
  Filled 2019-07-26: qty 17

## 2019-07-26 MED ORDER — DARBEPOETIN ALFA 40 MCG/0.4ML IJ SOSY
40.0000 ug | PREFILLED_SYRINGE | Freq: Once | INTRAMUSCULAR | Status: AC
  Administered 2019-07-26: 40 ug via SUBCUTANEOUS
  Filled 2019-07-26: qty 0.4

## 2019-07-26 NOTE — Progress Notes (Addendum)
Patient ID: [name], female   DOB: 1945/10/10, 74 y.o.   MRN: [id number]  PROGRESS NOTE    [name]  WUJ:[id number] DOB: 04/09/1945 DOA: 07/23/2019 PCP: Benito Mccreedy, MD   Brief Narrative:  74 year old female with history of hypertension, anemia of chronic disease, chronic kidney disease stage IV, diabetes mellitus type 2, hypertension, obesity, seizure disorder, recent hospitalization and discharge on 07/22/2019 for near syncope during which her diuretics were held by cardiology presented with increasing exertional dyspnea with ongoing dizziness.  On presentation in the ED, she was saturating in the upper 70s on room air and chest x-ray was showing cardiomegaly with vascular congestion.  Lower extremity ultrasound was limited study but negative for DVT.  Creatinine was 3.36, up from 2.83 on 07/22/2019 with mildly elevated troponin and BNP of 3174.  She was started on intravenous Lasix.  Cardiology was consulted.  Subsequently, nephrology was also consulted.  Assessment & Plan:   Acute hypoxic respiratory failure -Probably from CHF exacerbation. -Improving. Currently on room air. -Lower extremity duplex ultrasound on presentation was technically limited but negative for DVT in bilateral lower extremities  Acute on chronic diastolic heart failure -Patient presented with worsening shortness of breath and chest x-ray showed cardiomegaly with vascular congestion.  Patient was recently discharged from the hospital on 07/22/2019 off of diuretics. -Lasix has been changed to 80 mg p.o. daily starting today by nephrology.  Cardiology following.  Continue hydralazine.  Strict input and output.  Daily weights.  Fluid restriction.  Echo showed EF of 60 to 65% with grade 1 diastolic dysfunction  Near syncope -Probably from fluid overload and heart failure. -EP evaluation appreciated and recommend no indication for pacing.  EP signed off. -Fall precautions.   Hypervolemic hyponatremia -Sodium  was 128 in the ED.  134 today.  Monitor.  Chronic kidney disease stage IV -Baseline creatinine around 3.1-3.5 -Presented with creatinine of 3.36 in the ED -Creatinine 2.9 today. -Monitor. -Lasix as above -Nephrology following.  Elevated troponin -Probably from demand ischemia due to CHF exacerbation. -Troponins did not trend up significantly.  Echo as above.  Cardiology following.  No chest pain.  Continue aspirin, statin  Seizure disorder -Continue Tegretol.  Hypertension -Continue Norvasc, Lasix and hydralazine.  Avoiding AV blockers due to junctional bradycardia/near syncope -Blood pressure stable currently.  Diabetes mellitus type 2 -A1c 5.3 in June 2021.  Continue CBGs with low-dose SSI  Prior unspecified CVA -Outpatient follow-up with neurology.  Continue aspirin and statin.  CT scan of the brain without contrast during recent admission without acute findings  Anemia of chronic disease -From chronic kidney disease.  Hemoglobin stable.  Monitor  Generalized deconditioning -PT is recommending SNF placement.  Social worker consulted.  Morbid obesity -Outpatient follow-up  DVT prophylaxis: Heparin subcutaneous Code Status: Full Family Communication: Patient at bedside Disposition Plan: Status is: Inpatient  Remains inpatient appropriate because:IV treatments appropriate due to intensity of illness or inability to take PO   Dispo: The patient is from: Home              Anticipated d/c is to: SNF              Anticipated d/c date is: 1 day              Patient currently is medically stable to d/c.  Consultants: Cardiology/nephrology  Procedures:  Echo  1. Left ventricular ejection fraction, by estimation, is 60 to 65%. The  left ventricle has normal function. The left ventricle  has no regional  wall motion abnormalities. There is mild concentric left ventricular  hypertrophy. Left ventricular diastolic  parameters are consistent with Grade I diastolic  dysfunction (impaired  relaxation). Elevated left atrial pressure.  2. Right ventricular systolic function is mildly reduced. The right  ventricular size is mildly enlarged. Mildly increased right ventricular  wall thickness. There is mildly elevated pulmonary artery systolic  pressure.  3. Left atrial size was moderately dilated.  4. Right atrial size was mildly dilated.  5. The mitral valve is normal in structure. No evidence of mitral valve  regurgitation. No evidence of mitral stenosis.  6. The aortic valve is normal in structure. Aortic valve regurgitation is  not visualized. No aortic stenosis is present.  7. The inferior vena cava is normal in size with greater than 50%  respiratory variability, suggesting right atrial pressure of 3 mmHg.  Antimicrobials: None   Subjective: Patient seen and examined at bedside. Sleepy, wakes up slightly, answers any questions. Denies worsening shortness of breath.  No chest pain reported.  No overnight fever, vomiting or diarrhea reported.  Patient is a poor historian. Objective: Vitals:   07/25/19 1015 07/25/19 1152 07/25/19 1948 07/26/19 0551  BP: 101/75 109/66 135/72 127/71  Pulse: 66 66 70 74  Resp: 18 16 18 19   Temp: 98.3 F (36.8 C) 98.1 F (36.7 C) 98.5 F (36.9 C) 100 F (37.8 C)  TempSrc: Oral Oral Oral Oral  SpO2: 99% 100% 100% 96%  Weight:    101.9 kg  Height:        Intake/Output Summary (Last 24 hours) at 07/26/2019 0727 Last data filed at 07/26/2019 0600 Gross per 24 hour  Intake 460 ml  Output 951 ml  Net -491 ml   Filed Weights   07/23/19 1827 07/25/19 0427 07/26/19 0551  Weight: 106.5 kg 103.9 kg 101.9 kg    Examination:  General exam: No acute distress.  Looks chronically ill. Currently on room air. Respiratory system: Bilateral decreased breath sounds at bases with bibasilar crackles, no wheezing Cardiovascular system: Rate controlled, S1-S2 heard Gastrointestinal system: Abdomen is morbidly obese,  nondistended, soft and nontender.  Bowel sounds are heard Extremities: Lower extremity edema present bilaterally, no cyanosis Central nervous system: Poor historian. Sleepy, wakes up slightly, answers very little questions. No focal neurological deficits.  Moves extremities  skin: No ulcers or rashes Psychiatry: Extremely flat affect    Data Reviewed: I have personally reviewed following labs and imaging studies  CBC: Recent Labs  Lab 07/21/19 2316 07/23/19 1834 07/25/19 0800 07/26/19 0523  WBC 5.5 11.6* 7.4 8.0  NEUTROABS 4.6 10.5* 5.5 6.6  HGB 8.2* 8.9* 9.3* 8.7*  HCT 24.6* 26.8* 27.8* 26.3*  MCV 86.3 86.7 88.3 87.7  PLT 235 254 265 564   Basic Metabolic Panel: Recent Labs  Lab 07/21/19 2316 07/22/19 0519 07/23/19 1834 07/25/19 0800 07/26/19 0523  NA 124* 130* 128* 133* 134*  K 4.2 4.5 5.3* 4.4 4.6  CL 93* 97* 94* 98 100  CO2 20* 23 19* 22 21*  GLUCOSE 356* 100* 239* 92 158*  BUN 53* 51* 63* 69* 67*  CREATININE 2.97* 2.83* 3.36* 3.10* 2.90*  CALCIUM 8.3* 8.7* 8.9 8.9 8.7*  MG  --   --  2.2 2.2 2.3  PHOS  --   --   --   --  3.9   GFR: Estimated Creatinine Clearance: 19.6 mL/min (A) (by C-G formula based on SCr of 2.9 mg/dL (H)). Liver Function Tests: Recent Labs  Lab 07/23/19 1834  AST 18  ALT 17  ALKPHOS 89  BILITOT 0.9  PROT 7.3  ALBUMIN 3.6   No results for input(s): LIPASE, AMYLASE in the last 168 hours. No results for input(s): AMMONIA in the last 168 hours. Coagulation Profile: No results for input(s): INR, PROTIME in the last 168 hours. Cardiac Enzymes: No results for input(s): CKTOTAL, CKMB, CKMBINDEX, TROPONINI in the last 168 hours. BNP (last 3 results) No results for input(s): PROBNP in the last 8760 hours. HbA1C: No results for input(s): HGBA1C in the last 72 hours. CBG: Recent Labs  Lab 07/25/19 0618 07/25/19 1055 07/25/19 1637 07/25/19 2123 07/26/19 0629  GLUCAP 87 126* 127* 146* 168*   Lipid Profile: No results for input(s):  CHOL, HDL, LDLCALC, TRIG, CHOLHDL, LDLDIRECT in the last 72 hours. Thyroid Function Tests: No results for input(s): TSH, T4TOTAL, FREET4, T3FREE, THYROIDAB in the last 72 hours. Anemia Panel: Recent Labs    07/25/19 1459  FERRITIN 35  TIBC 351  IRON 17*   Sepsis Labs: No results for input(s): PROCALCITON, LATICACIDVEN in the last 168 hours.  Recent Results (from the past 240 hour(s))  SARS Coronavirus 2 by RT PCR (hospital order, performed in [facility] hospital lab) Nasopharyngeal Nasopharyngeal Swab     Status: None   Collection Time: 07/22/19  1:05 AM   Specimen: Nasopharyngeal Swab  Result Value Ref Range Status   SARS Coronavirus 2 NEGATIVE NEGATIVE Final    Comment: (NOTE) SARS-CoV-2 target nucleic acids are NOT DETECTED.  The SARS-CoV-2 RNA is generally detectable in upper and lower respiratory specimens during the acute phase of infection. The lowest concentration of SARS-CoV-2 viral copies this assay can detect is 250 copies / mL. A negative result does not preclude SARS-CoV-2 infection and should not be used as the sole basis for treatment or other patient management decisions.  A negative result may occur with improper specimen collection / handling, submission of specimen other than nasopharyngeal swab, presence of viral mutation(s) within the areas targeted by this assay, and inadequate number of viral copies (<250 copies / mL). A negative result must be combined with clinical observations, patient history, and epidemiological information.  Fact Sheet for Patients:   StrictlyIdeas.no  Fact Sheet for Healthcare Providers: BankingDealers.co.za  This test is not yet approved or  cleared by the [place] FDA and has been authorized for detection and/or diagnosis of SARS-CoV-2 by FDA under an Emergency Use Authorization (EUA).  This EUA will remain in effect (meaning this test can be used) for the duration of  the COVID-19 declaration under Section 564(b)(1) of the Act, 21 U.S.C. section 360bbb-3(b)(1), unless the authorization is terminated or revoked sooner.  Performed at [facility], [place] [address] [place], [place] [address]   SARS Coronavirus 2 by RT PCR (hospital order, performed in [facility] hospital lab) Nasopharyngeal Nasopharyngeal Swab     Status: None   Collection Time: 07/23/19  7:55 PM   Specimen: Nasopharyngeal Swab  Result Value Ref Range Status   SARS Coronavirus 2 NEGATIVE NEGATIVE Final    Comment: (NOTE) SARS-CoV-2 target nucleic acids are NOT DETECTED.  The SARS-CoV-2 RNA is generally detectable in upper and lower respiratory specimens during the acute phase of infection. The lowest concentration of SARS-CoV-2 viral copies this assay can detect is 250 copies / mL. A negative result does not preclude SARS-CoV-2 infection and should not be used as the sole basis for treatment or other patient management decisions.  A negative  result may occur with improper specimen collection / handling, submission of specimen other than nasopharyngeal swab, presence of viral mutation(s) within the areas targeted by this assay, and inadequate number of viral copies (<250 copies / mL). A negative result must be combined with clinical observations, patient history, and epidemiological information.  Fact Sheet for Patients:   StrictlyIdeas.no  Fact Sheet for Healthcare Providers: BankingDealers.co.za  This test is not yet approved or  cleared by the [place] FDA and has been authorized for detection and/or diagnosis of SARS-CoV-2 by FDA under an Emergency Use Authorization (EUA).  This EUA will remain in effect (meaning this test can be used) for the duration of the COVID-19 declaration under Section 564(b)(1) of the Act, 21 U.S.C. section 360bbb-3(b)(1), unless the authorization is terminated or revoked sooner.  Performed  at [facility], [place] [address], [place], [place] [address]          Radiology Studies: ECHOCARDIOGRAM COMPLETE  Result Date: 07/24/2019    ECHOCARDIOGRAM REPORT   Patient Name:   [name] Date of Exam: 07/24/2019 Medical Rec #:  [id number]      Height:       62.0 in Accession #:    [id number]     Weight:       234.8 lb Date of Birth:  12-Sep-1945       BSA:          2.047 m Patient Age:    89 years       BP:           107/80 mmHg Patient Gender: F              HR:           72 bpm. Exam Location:  Inpatient Procedure: 2D Echo Indications:    acute diastolic chf 902.40  History:        Patient has prior history of Echocardiogram examinations, most                 recent 03/07/2019. CHF, chronic kidney disease,                 Signs/Symptoms:Shortness of Breath and elevated troponin; Risk                 Factors:Diabetes and Hypertension.  Sonographer:    Johny Chess Referring Phys: [id number] [place]  1. Left ventricular ejection fraction, by estimation, is 60 to 65%. The left ventricle has normal function. The left ventricle has no regional wall motion abnormalities. There is mild concentric left ventricular hypertrophy. Left ventricular diastolic parameters are consistent with Grade I diastolic dysfunction (impaired relaxation). Elevated left atrial pressure.  2. Right ventricular systolic function is mildly reduced. The right ventricular size is mildly enlarged. Mildly increased right ventricular wall thickness. There is mildly elevated pulmonary artery systolic pressure.  3. Left atrial size was moderately dilated.  4. Right atrial size was mildly dilated.  5. The mitral valve is normal in structure. No evidence of mitral valve regurgitation. No evidence of mitral stenosis.  6. The aortic valve is normal in structure. Aortic valve regurgitation is not visualized. No aortic stenosis is present.  7. The inferior vena cava is normal in size with greater than 50% respiratory  variability, suggesting right atrial pressure of 3 mmHg. FINDINGS  Left Ventricle: Left ventricular ejection fraction, by estimation, is 60 to 65%. The left ventricle has normal function. The left ventricle has no regional  wall motion abnormalities. The left ventricular internal cavity size was normal in size. There is  mild concentric left ventricular hypertrophy. Left ventricular diastolic parameters are consistent with Grade I diastolic dysfunction (impaired relaxation). Elevated left atrial pressure. Right Ventricle: The right ventricular size is mildly enlarged. Mildly increased right ventricular wall thickness. Right ventricular systolic function is mildly reduced. There is mildly elevated pulmonary artery systolic pressure. The tricuspid regurgitant velocity is 2.76 m/s, and with an assumed right atrial pressure of 3 mmHg, the estimated right ventricular systolic pressure is 28.7 mmHg. Left Atrium: Left atrial size was moderately dilated. Right Atrium: Right atrial size was mildly dilated. Pericardium: There is no evidence of pericardial effusion. Mitral Valve: The mitral valve is normal in structure. Normal mobility of the mitral valve leaflets. No evidence of mitral valve regurgitation. No evidence of mitral valve stenosis. Tricuspid Valve: The tricuspid valve is normal in structure. Tricuspid valve regurgitation is not demonstrated. No evidence of tricuspid stenosis. Aortic Valve: The aortic valve is normal in structure. Aortic valve regurgitation is not visualized. No aortic stenosis is present. Pulmonic Valve: The pulmonic valve was normal in structure. Pulmonic valve regurgitation is not visualized. No evidence of pulmonic stenosis. Aorta: The aortic root is normal in size and structure. Venous: The inferior vena cava is normal in size with greater than 50% respiratory variability, suggesting right atrial pressure of 3 mmHg. IAS/Shunts: No atrial level shunt detected by color flow Doppler.  LEFT  VENTRICLE PLAX 2D LVIDd:         3.90 cm  Diastology LVIDs:         2.80 cm  LV e' lateral:   6.00 cm/s LV PW:         1.20 cm  LV E/e' lateral: 14.8 LVOT diam:     2.10 cm  LV e' medial:    6.09 cm/s LV SV:         67       LV E/e' medial:  14.6 LV SV Index:   33 LVOT Area:     3.46 cm  RIGHT VENTRICLE            IVC RV S prime:     9.90 cm/s  IVC diam: 1.60 cm TAPSE (M-mode): 1.6 cm LEFT ATRIUM             Index       RIGHT ATRIUM           Index LA diam:        4.60 cm 2.25 cm/m  RA Area:     13.70 cm LA Vol (A2C):   54.8 ml 26.78 ml/m RA Volume:   32.00 ml  15.64 ml/m LA Vol (A4C):   57.3 ml 28.00 ml/m LA Biplane Vol: 58.5 ml 28.58 ml/m  AORTIC VALVE LVOT Vmax:   93.30 cm/s LVOT Vmean:  57.100 cm/s LVOT VTI:    0.193 m  AORTA Ao Root diam: 2.80 cm Ao Asc diam:  2.70 cm MITRAL VALVE                TRICUSPID VALVE MV Area (PHT): 2.76 cm     TR Peak grad:   30.5 mmHg MV Decel Time: 275 msec     TR Vmax:        276.00 cm/s MV E velocity: 88.70 cm/s MV A velocity: 103.00 cm/s  SHUNTS MV E/A ratio:  0.86         Systemic VTI:  0.19 m  Systemic Diam: 2.10 cm Sanda Klein MD Electronically signed by Sanda Klein MD Signature Date/Time: 07/24/2019/10:50:37 AM    Final         Scheduled Meds: . amLODipine  5 mg Oral Daily  . aspirin  81 mg Oral Daily  . carbamazepine  400 mg Oral BID  . citalopram  20 mg Oral Daily  . ezetimibe  10 mg Oral Daily  . furosemide  80 mg Oral Daily  . heparin  5,000 Units Subcutaneous Q8H  . hydrALAZINE  25 mg Oral Daily  . insulin aspart  0-5 Units Subcutaneous QHS  . insulin aspart  0-9 Units Subcutaneous TID WC  . mirtazapine  15 mg Oral QHS  . pantoprazole  40 mg Oral Daily  . simvastatin  40 mg Oral Daily  . sodium chloride flush  3 mL Intravenous Q12H   Continuous Infusions: . sodium chloride            Aline August, MD [facility] 07/26/2019, 7:27 AM

## 2019-07-26 NOTE — Progress Notes (Signed)
[facility] will sign off.   Medication Recommendations:  Diuretics as recommended by nephrology Other recommendations (labs, testing, etc):  As recommended on consult note Follow up as an outpatient:  AS scheduled

## 2019-07-26 NOTE — Progress Notes (Signed)
[facility] Progress Note  Name: [name] MRN: [id number] DOB: October 14, 1945  Chief Complaint:  States came after another fall  Subjective:  Strict ins/outs not available - she had 1 liter UOP over 6/30 as well as one unmeasured void.  Weight down from 106.5 kg to 101.9 kg most recently.  Feels ok this morning.  We discussed risks/benefits/indications for ESA therapy and she consents to receive a dose of aranesp today.   Review of systems:  Denies n/v Denies shortness of breath  Denies chest pain  ---------------- Background on consult:  PCP Benito Mccreedy, MD - [facility]:Christopher Angelena Form, MD  HPI:  [name] is a 74 y.o. female with a history including DM, HTN, chronic diastolic CHF, obesity, OSA, hx CVA, GERD, and depression who presented to the hospital with shortness of breath and after a fall.  She had been admitted 6/27 after another fall at home felt secondary to loss of balance per charting; no loss of consciousness.  Per charting lasix was held and then discontinued on discharge.  She presented 6/28 with shortness of breath and was saturating in 70's on room air per charting.  She has gotten lasix 40 mg IV on 6/29 and 6/30.  States breathing is better now .  Note that she recently re-established care with nephrology.  I saw her in clinic on 06/21/19 and she was previously seen by Dr. Florene Glen in 2017.  At her recent nephro appt we asked that she reduce lasix from 80 mg BID to 80 mg daily (though note there was a discrepancy between the recorded dose of lasix 40 mg daily per cards/epic med list and the 80 mg BID on both her medication bottle brought to the appt, PCP med list, and she had reported that as her home regimen).  She confirms that she's been on lasix 80 mg daily at home (not BID) prior to her fall this week.  She does have trouble with coming up with medication doses though.  Her Cr was 2.67 on 06/21/19.  Her  oxygen sat is 100% on 2 liters.  Other/Clinic hx  Recent baseline Cr 3-3.4. See trends below. She was seen by Dr. Posey Pronto and Dr. Marval Regal inpatient for AKI on CKD stage IV in 02/2019 felt to be hemodynamically related in the setting of decompensated CHF, hypotension, and diuresis. She had the 02/2019 admission with preceding falls and right subdural hematoma and then code stroke inpatient with separate M1 occlusion and s/p mechanical thrombectomy. She states that she has had DM for at least 40 years. She's not sure how long she has had HTN. She denies any NSAID use but previously took NSAIDs as needed before being told to stop when saw Dr. Florene Glen. She denies any period of heavy use. She denies any family history of CKD or ESRD. Per cardiology/epic med list had been on lasix 40 mg daily and her bottle she gave [place] at her appt and PCP med list both state lasix 80 mg BID and she stated she has been on lasix BID for quite some time. She has vascular disease and a hx of brachial artery thrombectomy in addition to the CVA noted above. Note she had AKI in 11/2014 with peak Cr 4. Note also history of severe bradycardia in setting of metoprolol.    Prior Labs: 06/21/19 - Cr was 2.67, PTH 158 03/14/19 - Cr 3.16, Hb 7.9 03/11/19 - Cr 4.69 03/02/19 - Cr 3.43 04/2017 - Cr 3  Recent baseline Cr charted as 3-3.4  Imaging: 2/201 - TTE LVEF 14-43% diastolic function couldn't be assessed 11/2016 - renal US - right kidney 10.1 cm and left 9.7; normal echo no hydro  Intake/Output Summary (Last 24 hours) at 07/26/2019 1053 Last data filed at 07/26/2019 0930 Gross per 24 hour  Intake 360 ml  Output 951 ml  Net -591 ml    Vitals:  Vitals:   07/25/19 1152 07/25/19 1948 07/26/19 0551 07/26/19 0931  BP: 109/66 135/72 127/71 105/69  Pulse: 66 70 74 76  Resp: 16 18 19    Temp: 98.1 F (36.7 C) 98.5 F (36.9 C) 100 F (37.8 C)   TempSrc: Oral Oral Oral   SpO2: 100% 100% 96% 91%  Weight:   101.9 kg    Height:         Physical Exam:  General adult female in bed in no acute distress HEENT normocephalic atraumatic extraocular movements intact sclera anicteric Neck supple trachea midline Lungs clear to auscultation bilaterally normal work of breathing at rest  Heart S1S2 no rub Abdomen soft nontender distended with obese habitus Extremities no pitting edema  Psych normal mood and affect Neuro - alert and oriented x 3 provides hx and follows commands   Medications reviewed   Labs:  BMP Latest Ref Rng & Units 07/26/2019 07/25/2019 07/23/2019  Glucose 70 - 99 mg/dL 158(H) 92 239(H)  BUN 8 - 23 mg/dL 67(H) 69(H) 63(H)  Creatinine 0.44 - 1.00 mg/dL 2.90(H) 3.10(H) 3.36(H)  Sodium 135 - 145 mmol/L 134(L) 133(L) 128(L)  Potassium 3.5 - 5.1 mmol/L 4.6 4.4 5.3(H)  Chloride 98 - 111 mmol/L 100 98 94(L)  CO2 22 - 32 mmol/L 21(L) 22 19(L)  Calcium 8.9 - 10.3 mg/dL 8.7(L) 8.9 8.9     Assessment/Plan:   # CKD stage IV  - Secondary to diabetic nephropathy and microvascular disease from HTN - Continue with lasix from a renal standpoint - she had been on lasix 80 mg daily prior to her presentations this week.   - Continue lasix at 80 mg daily.  Note discrepancy with epic med rec and her reported home dose to me in clinic based on her physical medication bottles/med review with our clinic.   - She is near her recent renal baseline   # Acute on Chronic diastolic CHF - Lasix as above    # Acute hypoxic resp failure - Was felt 2/2 fluid overload/CHF - optimize volume status with lasix   # HTN with CKD - hypotension improved  - I reduced amlodipine to 5 mg daily given hypotension    # Anemia CKD - Iron deficiency.  Feraheme 510 mg IV once on 7/1.  Aranesp 40 mcg once on 7/1    # Bradycardia - avoid beta blockers - followed by EP  # Metabolic bone disease/Secondary hyperpara  - Will follow trends outpatient after high dose weekly vit D. Phos acceptable   Disposition per primary  team.  Renal function is at her baseline.  Will set up follow-up with me in clinic at [place] in two weeks after discharge  Claudia Desanctis, MD 07/26/2019 11:14 AM

## 2019-07-26 NOTE — TOC Progression Note (Signed)
Transition of Care [facility]) - Progression Note    Patient Details  Name: [name] MRN: [id number] Date of Birth: [birth year]  Transition of Care [facility]) CM/SW [place], LCSW Phone Number: 07/26/2019, 9:29 AM  Clinical Narrative:    CSW received call back from patient's daughter. CSW provided bed offers. She reported that [facility] would be a good option since patient does not want to go back to [facility]. CSW waiting to hear back from [facility] on bed availability. CSW faxed clinicals to insurance for review and will request an updated COVID test.    Expected Discharge Plan: [place] Barriers to Discharge: Continued Medical Work up  Expected Discharge Plan and Services Expected Discharge Plan: [place] In-house Referral: Clinical Social Work   Post Acute Care Choice: [place] Living arrangements for the past 2 months: Single Family Home                                       Social Determinants of Health (SDOH) Interventions    Readmission Risk Interventions Readmission Risk Prevention Plan 07/25/2019  Transportation Screening Complete  PCP or Specialist Appt within 3-5 Days Complete  HRI or Home Care Consult Complete  Social Work Consult for [place] Planning/Counseling Complete  Palliative Care Screening Not Applicable  Medication Review Press photographer) Referral to Pharmacy  Some recent data might be hidden

## 2019-07-27 DIAGNOSIS — R5381 Other malaise: Secondary | ICD-10-CM | POA: Diagnosis not present

## 2019-07-27 DIAGNOSIS — I499 Cardiac arrhythmia, unspecified: Secondary | ICD-10-CM | POA: Diagnosis not present

## 2019-07-27 DIAGNOSIS — E1165 Type 2 diabetes mellitus with hyperglycemia: Secondary | ICD-10-CM | POA: Diagnosis not present

## 2019-07-27 DIAGNOSIS — E119 Type 2 diabetes mellitus without complications: Secondary | ICD-10-CM | POA: Diagnosis not present

## 2019-07-27 DIAGNOSIS — R112 Nausea with vomiting, unspecified: Secondary | ICD-10-CM | POA: Diagnosis not present

## 2019-07-27 DIAGNOSIS — J9601 Acute respiratory failure with hypoxia: Secondary | ICD-10-CM | POA: Diagnosis not present

## 2019-07-27 DIAGNOSIS — I5033 Acute on chronic diastolic (congestive) heart failure: Secondary | ICD-10-CM | POA: Diagnosis not present

## 2019-07-27 DIAGNOSIS — Z743 Need for continuous supervision: Secondary | ICD-10-CM | POA: Diagnosis not present

## 2019-07-27 DIAGNOSIS — I10 Essential (primary) hypertension: Secondary | ICD-10-CM | POA: Diagnosis not present

## 2019-07-27 DIAGNOSIS — D638 Anemia in other chronic diseases classified elsewhere: Secondary | ICD-10-CM | POA: Diagnosis not present

## 2019-07-27 DIAGNOSIS — R2681 Unsteadiness on feet: Secondary | ICD-10-CM | POA: Diagnosis not present

## 2019-07-27 DIAGNOSIS — E871 Hypo-osmolality and hyponatremia: Secondary | ICD-10-CM | POA: Diagnosis not present

## 2019-07-27 DIAGNOSIS — R778 Other specified abnormalities of plasma proteins: Secondary | ICD-10-CM | POA: Diagnosis not present

## 2019-07-27 DIAGNOSIS — I5021 Acute systolic (congestive) heart failure: Secondary | ICD-10-CM | POA: Diagnosis not present

## 2019-07-27 DIAGNOSIS — R55 Syncope and collapse: Secondary | ICD-10-CM | POA: Diagnosis not present

## 2019-07-27 DIAGNOSIS — R001 Bradycardia, unspecified: Secondary | ICD-10-CM | POA: Diagnosis not present

## 2019-07-27 DIAGNOSIS — M6281 Muscle weakness (generalized): Secondary | ICD-10-CM | POA: Diagnosis not present

## 2019-07-27 DIAGNOSIS — R69 Illness, unspecified: Secondary | ICD-10-CM | POA: Diagnosis not present

## 2019-07-27 DIAGNOSIS — J9690 Respiratory failure, unspecified, unspecified whether with hypoxia or hypercapnia: Secondary | ICD-10-CM | POA: Diagnosis not present

## 2019-07-27 DIAGNOSIS — R000 Tachycardia, unspecified: Secondary | ICD-10-CM | POA: Diagnosis not present

## 2019-07-27 DIAGNOSIS — M2550 Pain in unspecified joint: Secondary | ICD-10-CM | POA: Diagnosis not present

## 2019-07-27 DIAGNOSIS — Z7401 Bed confinement status: Secondary | ICD-10-CM | POA: Diagnosis not present

## 2019-07-27 DIAGNOSIS — R278 Other lack of coordination: Secondary | ICD-10-CM | POA: Diagnosis not present

## 2019-07-27 DIAGNOSIS — N184 Chronic kidney disease, stage 4 (severe): Secondary | ICD-10-CM | POA: Diagnosis not present

## 2019-07-27 DIAGNOSIS — D649 Anemia, unspecified: Secondary | ICD-10-CM | POA: Diagnosis not present

## 2019-07-27 DIAGNOSIS — R569 Unspecified convulsions: Secondary | ICD-10-CM | POA: Diagnosis not present

## 2019-07-27 DIAGNOSIS — I129 Hypertensive chronic kidney disease with stage 1 through stage 4 chronic kidney disease, or unspecified chronic kidney disease: Secondary | ICD-10-CM | POA: Diagnosis not present

## 2019-07-27 LAB — BASIC METABOLIC PANEL
Anion gap: 11 (ref 5–15)
BUN: 62 mg/dL — ABNORMAL HIGH (ref 8–23)
CO2: 24 mmol/L (ref 22–32)
Calcium: 9 mg/dL (ref 8.9–10.3)
Chloride: 99 mmol/L (ref 98–111)
Creatinine, Ser: 2.89 mg/dL — ABNORMAL HIGH (ref 0.44–1.00)
GFR calc Af Amer: 18 mL/min — ABNORMAL LOW (ref >60)
GFR calc non Af Amer: 16 mL/min — ABNORMAL LOW (ref >60)
Glucose, Bld: 260 mg/dL — ABNORMAL HIGH (ref 70–99)
Potassium: 4.2 mmol/L (ref 3.5–5.1)
Sodium: 134 mmol/L — ABNORMAL LOW (ref 135–145)

## 2019-07-27 LAB — GLUCOSE, CAPILLARY
Glucose-Capillary: 227 mg/dL — ABNORMAL HIGH (ref 70–99)
Glucose-Capillary: 207 mg/dL — ABNORMAL HIGH (ref 70–99)

## 2019-07-27 LAB — MAGNESIUM: Magnesium: 2.2 mg/dL (ref 1.7–2.4)

## 2019-07-27 MED ORDER — LOPERAMIDE HCL 2 MG PO CAPS: 2.0000 mg | ORAL_CAPSULE | Freq: Four times a day (QID) | ORAL | 0 refills | Status: AC | PRN

## 2019-07-27 MED ORDER — AMLODIPINE BESYLATE 10 MG PO TABS: 5.0000 mg | ORAL_TABLET | Freq: Every day | ORAL | Status: AC

## 2019-07-27 MED ORDER — FUROSEMIDE 80 MG PO TABS: 80.0000 mg | ORAL_TABLET | Freq: Every day | ORAL | 0 refills | Status: AC

## 2019-07-27 NOTE — TOC Progression Note (Signed)
Transition of Care [facility]) - Progression Note    Patient Details  Name: MARKIYA KEEFE MRN: [id number] Date of Birth: [birth year]  Transition [facility] Care [facility]) CM/SW [place], LCSW Phone Number: 07/27/2019, 9:07 AM  Clinical Narrative:    CSW received insurance approval for [facility] SNF: [id number], Plan ID [id number], effective through 07/31/19.    Expected Discharge Plan: [place] Barriers to Discharge: Continued Medical Work up  Expected Discharge Plan and Services Expected Discharge Plan: [place] In-house Referral: Clinical Social Work   Post Acute Care Choice: [place] Living arrangements for the past 2 months: Single Family Home                                       Social Determinants of Health (SDOH) Interventions    Readmission Risk Interventions Readmission Risk Prevention Plan 07/25/2019  Transportation Screening Complete  PCP or Specialist Appt within 3-5 Days Complete  HRI or [place] Complete  Social Work Consult for [place] Planning/Counseling Complete  Palliative Care Screening Not Applicable  Medication Review Press photographer) Referral to Pharmacy  Some recent data might be hidden

## 2019-07-27 NOTE — Progress Notes (Signed)
[facility] Progress Note  Name: [name] MRN: [id number] DOB: 28-Dec-1945  Chief Complaint:  States came after another fall  Subjective:  She had 1.8 liters UOP over 7/1.  States she is to be discharged today to a SNF.  Has been on 2 liters oxygen.  Feels swelling is better.  Review of systems:   Denies n/v Reports shortness of breath is better today  Denies chest pain  ---------------- Background on consult:  PCP Benito Mccreedy, MD - [facility]:Christopher Angelena Form, MD  HPI:  [name] is a 74 y.o. female with a history including DM, HTN, chronic diastolic CHF, obesity, OSA, hx CVA, GERD, and depression who presented to the hospital with shortness of breath and after a fall.  She had been admitted 6/27 after another fall at home felt secondary to loss of balance per charting; no loss of consciousness.  Per charting lasix was held and then discontinued on discharge.  She presented 6/28 with shortness of breath and was saturating in 70's on room air per charting.  She has gotten lasix 40 mg IV on 6/29 and 6/30.  States breathing is better now .  Note that she recently re-established care with nephrology.  I saw her in clinic on 06/21/19 and she was previously seen by Dr. Florene Glen in 2017.  At her recent nephro appt we asked that she reduce lasix from 80 mg BID to 80 mg daily (though note there was a discrepancy between the recorded dose of lasix 40 mg daily per cards/epic med list and the 80 mg BID on both her medication bottle brought to the appt, PCP med list, and she had reported that as her home regimen).  She confirms that she's been on lasix 80 mg daily at home (not BID) prior to her fall this week.  She does have trouble with coming up with medication doses though.  Her Cr was 2.67 on 06/21/19.  Her oxygen sat is 100% on 2 liters.  Other/Clinic hx  Recent baseline Cr 3-3.4. See trends below. She was seen by Dr. Posey Pronto  and Dr. Marval Regal inpatient for AKI on CKD stage IV in 02/2019 felt to be hemodynamically related in the setting of decompensated CHF, hypotension, and diuresis. She had the 02/2019 admission with preceding falls and right subdural hematoma and then code stroke inpatient with separate M1 occlusion and s/p mechanical thrombectomy. She states that she has had DM for at least 40 years. She's not sure how long she has had HTN. She denies any NSAID use but previously took NSAIDs as needed before being told to stop when saw Dr. Florene Glen. She denies any period of heavy use. She denies any family history of CKD or ESRD. Per cardiology/epic med list had been on lasix 40 mg daily and her bottle she gave [place] at her appt and PCP med list both state lasix 80 mg BID and she stated she has been on lasix BID for quite some time. She has vascular disease and a hx of brachial artery thrombectomy in addition to the CVA noted above. Note she had AKI in 11/2014 with peak Cr 4. Note also history of severe bradycardia in setting of metoprolol.    Prior Labs: 06/21/19 - Cr was 2.67, PTH 158 03/14/19 - Cr 3.16, Hb 7.9 03/11/19 - Cr 4.69 03/02/19 - Cr 3.43 04/2017 - Cr 3  Recent baseline Cr charted as 3-3.4  Imaging: 2/201 - TTE LVEF 64-33% diastolic function couldn't be  assessed 11/2016 - renal US - right kidney 10.1 cm and left 9.7; normal echo no hydro  Intake/Output Summary (Last 24 hours) at 07/27/2019 1010 Last data filed at 07/27/2019 0407 Gross per 24 hour  Intake 120 ml  Output 1550 ml  Net -1430 ml    Vitals:  Vitals:   07/26/19 0931 07/26/19 1053 07/26/19 2033 07/27/19 0407  BP: 105/69 (!) 129/57 (!) 147/86 (!) 141/71  Pulse: 76 75 81 81  Resp:  18 19 19   Temp:  98.1 F (36.7 C) 99.1 F (37.3 C) 99.4 F (37.4 C)  TempSrc:  Oral Oral Oral  SpO2: 91% 95% 95%   Weight:    100.9 kg  Height:         Physical Exam:   General adult female in bed in no acute distress HEENT normocephalic atraumatic  extraocular movements intact sclera anicteric Neck supple trachea midline Lungs clear but reduced to auscultation bilaterally normal work of breathing at rest; on 2 liters oxygen Heart S1S2 no rub Abdomen soft nontender distended with obese habitus Extremities no pitting edema  Psych normal mood and affect Neuro - alert and oriented x 3 provides hx and follows commands   Medications reviewed   Labs:  BMP Latest Ref Rng & Units 07/27/2019 07/26/2019 07/25/2019  Glucose 70 - 99 mg/dL 260(H) 158(H) 92  BUN 8 - 23 mg/dL 62(H) 67(H) 69(H)  Creatinine 0.44 - 1.00 mg/dL 2.89(H) 2.90(H) 3.10(H)  Sodium 135 - 145 mmol/L 134(L) 134(L) 133(L)  Potassium 3.5 - 5.1 mmol/L 4.2 4.6 4.4  Chloride 98 - 111 mmol/L 99 100 98  CO2 22 - 32 mmol/L 24 21(L) 22  Calcium 8.9 - 10.3 mg/dL 9.0 8.7(L) 8.9     Assessment/Plan:   # CKD stage IV  - Secondary to diabetic nephropathy and microvascular disease from HTN - Continue with lasix from a renal standpoint - she had been on lasix 80 mg daily prior to her presentations this week.   - Continue lasix at 80 mg daily.  Note discrepancy with epic med rec and her reported home dose to me in clinic based on her physical medication bottles/med review with our clinic.   - She is near her recent renal baseline   # Acute on Chronic diastolic CHF - Lasix as above    # Acute hypoxic resp failure - Was felt 2/2 fluid overload/CHF - optimize volume status with lasix   # HTN with CKD - hypotension improved  - I reduced amlodipine to 5 mg daily given hypotension    # Anemia CKD - Iron deficiency.  Feraheme 510 mg IV once on 7/1.  Aranesp 40 mcg once on 7/1    # Bradycardia - avoid beta blockers - followed by EP  # Metabolic bone disease/Secondary hyperpara  - Will follow trends outpatient after high dose weekly vit D. Phos acceptable   Disposition per primary team.  Renal function is at her baseline.  Will set up follow-up with me in clinic at [place] in two weeks after discharge  Claudia Desanctis, MD 07/27/2019 10:22 AM

## 2019-07-27 NOTE — TOC Transition Note (Signed)
Transition of Care [facility]) - CM/SW Discharge Note   Patient Details  Name: [name] MRN: [id number] Date of Birth: 04-08-1945  Transition of Care [facility]) CM/SW Contact:  Benard Halsted, LCSW Phone Number: 07/27/2019, 10:15 AM   Clinical Narrative:    Patient will DC to: [facility] Anticipated DC date: 07/27/19 Family notified: Daughter, Juliann Pulse Transport by: Corey Harold 12pm  Please ensure patient's phone and charger go with her.   Per MD patient ready for DC to [facility]. RN, patient, patient's family, and facility notified of DC. Discharge Summary and FL2 sent to facility. RN to call report prior to discharge [phone]). DC packet on chart. Ambulance transport requested for patient.   CSW will sign off for now as social work intervention is no longer needed. Please consult [place] again if new needs arise.      Final next level of care: Skilled Nursing Facility Barriers to Discharge: No Barriers Identified   Patient Goals and CMS Choice Patient states their goals for this hospitalization and ongoing recovery are:: Return home CMS Medicare.gov Compare Post Acute Care list provided to:: Patient Choice offered to / list presented to : Patient  Discharge Placement   Existing PASRR number confirmed : 07/27/19          Patient chooses bed at: [facility] Patient to be transferred to facility by: [facility] Name of family member notified: Daughter, Juliann Pulse Patient and family notified of of transfer: 07/27/19  Discharge Plan and Services In-house Referral: Clinical Social Work   Post Acute Care Choice: [place]                               Social Determinants of Health (SDOH) Interventions     Readmission Risk Interventions Readmission Risk Prevention Plan 07/25/2019  Transportation Screening Complete  PCP or Specialist Appt within 3-5 Days Complete  HRI or [place] Complete  Social Work Consult for [place]  Planning/Counseling Complete  Palliative Care Screening Not Applicable  Medication Review Press photographer) Referral to Pharmacy  Some recent data might be hidden

## 2019-07-27 NOTE — Progress Notes (Signed)
Inpatient Diabetes Program Recommendations  AACE/ADA: New Consensus Statement on Inpatient Glycemic Control (2015)  Target Ranges:  Prepandial:   less than 140 mg/dL      Peak postprandial:   less than 180 mg/dL (1-2 hours)      Critically ill patients:  140 - 180 mg/dL   Lab Results  Component Value Date   GLUCAP 207 (H) 07/27/2019   HGBA1C 5.3 07/22/2019    Review of Glycemic Control Results for EMIL, KLASSEN (MRN [id number]) as of 07/27/2019 12:18  Ref. Range 07/26/2019 11:04 07/26/2019 16:22 07/26/2019 21:12 07/27/2019 06:21 07/27/2019 12:06  Glucose-Capillary Latest Ref Range: 70 - 99 mg/dL 150 (H) 193 (H) 248 (H) 227 (H) 207 (H)   Diabetes history:  DM2 Outpatient Diabetes medications:  70/30 5 units bid Glipizide 5 mg daily Current orders for Inpatient glycemic control:  Novolog 0-9 units tid Novolog 0-5 qhs  Inpatient Diabetes Program Recommendations:     Levemir 5 units daily  Will continue to follow while inpatient.  Thank you, Reche Dixon, RN, BSN Diabetes Coordinator Inpatient Diabetes Program [phone] (team pager from 8a-5p)

## 2019-07-27 NOTE — Discharge Summary (Signed)
Physician Discharge Summary  [name] VPX:[id number] DOB: 10/18/1945 DOA: 07/23/2019  PCP: Benito Mccreedy, MD  Admit date: 07/23/2019 Discharge date: 07/27/2019  Admitted From: Home Disposition: SNF  Recommendations for Outpatient Follow-up:  1. Follow up with SNF provider at earliest convenience with repeat CBC/BMP in the next few days 2. Outpatient follow-up with nephrology 3. Outpatient follow-up with cardiology 4. Follow up in ED if symptoms worsen or new appear   Home Health: No Equipment/Devices: None  Discharge Condition: Stable CODE STATUS: Full Diet recommendation: Heart healthy/carb modified/fluid restriction of up to 1500 cc a day  Brief/Interim Summary: 74 year old female with history of hypertension, anemia of chronic disease, chronic kidney disease stage IV, diabetes mellitus type 2, hypertension, obesity, seizure disorder, recent hospitalization and discharge on 07/22/2019 for near syncope during which her diuretics were held by cardiology presented with increasing exertional dyspnea with ongoing dizziness.  On presentation in the ED, she was saturating in the upper 70s on room air and chest x-ray was showing cardiomegaly with vascular congestion.  Lower extremity ultrasound was limited study but negative for DVT.  Creatinine was 3.36, up from 2.83 on 07/22/2019 with mildly elevated troponin and BNP of 3174.  She was started on intravenous Lasix.  Cardiology was consulted.  Subsequently, nephrology was also consulted.  During the hospitalization, cardiology has signed off and nephrology recommended Lasix 80 mg daily with outpatient follow-up with nephrology.  PT recommended SNF placement.  She will be discharged to SNF once bed is available.  Discharge Diagnoses:   Acute hypoxic respiratory failure -Probably from CHF exacerbation. -Improving. Currently on room air. -Lower extremity duplex ultrasound on presentation was technically limited but negative for DVT in  bilateral lower extremities  Acute on chronic diastolic heart failure -Patient presented with worsening shortness of breath and chest x-ray showed cardiomegaly with vascular congestion.  Patient was recently discharged from the hospital on 07/22/2019 off of diuretics. -Lasix has been changed to 80 mg p.o. daily by nephrology.  Cardiology has signed off.  Outpatient follow-up with cardiology. Continue hydralazine.  Strict input and output.    Continue Fluid restriction.  Low-salt diet. Echo showed EF of 60 to 65% with grade 1 diastolic dysfunction -Nephrology recommends Lasix 80 mg daily on discharge with outpatient follow-up with nephrology. -Discharge to SNF once bed is available.  Outpatient follow-up of BMP.  Near syncope -Probably from fluid overload and heart failure. -EP evaluation appreciated and recommend no indication for pacing.  EP signed off. -Fall precautions.   Hypervolemic hyponatremia -Sodium was 128 in the ED.  134 today.  Monitor.  Chronic kidney disease stage IV -Baseline creatinine around 3.1-3.5 -Presented with creatinine of 3.36 in the ED -Creatinine 2.89 today. -Outpatient follow-up -Lasix as above   Elevated troponin -Probably from demand ischemia due to CHF exacerbation. -Troponins did not trend up significantly.  Echo as above.  Cardiology following.  No chest pain.  Continue aspirin, statin  Seizure disorder -Continue Tegretol.  Hypertension -Continue Norvasc at a lower dose, Lasix and hydralazine.  Avoiding AV blockers due to junctional bradycardia/near syncope -Blood pressure stable currently.  Diabetes mellitus type 2 -A1c 5.3 in June 2021.  Continue home regimen of insulin.  Will hold glipizide on discharge.  Outpatient follow-up.  Carb modified diet.  Prior unspecified CVA -Outpatient follow-up with neurology.  Continue aspirin and statin.  CT scan of the brain without contrast during recent admission without acute findings  Anemia of  chronic disease -From chronic kidney disease.  Hemoglobin stable.  Monitor  Generalized deconditioning -PT is recommending SNF placement.    Discharge to SNF once bed is available  Morbid obesity -Outpatient follow-up   Discharge Instructions  Discharge Instructions    Ambulatory referral to Cardiology   Complete by: As directed    Diet - low sodium heart healthy   Complete by: As directed    Diet Carb Modified   Complete by: As directed    Increase activity slowly   Complete by: As directed      Allergies as of 07/27/2019      Reactions   Codeine Nausea And Vomiting   Metoprolol Other (See Comments)   Severe bradycardia.  Now getting admitted for 3rd time in 3 years for bradycardia onset after being started on toprol.  Please dont put patient on this med anymore!      Medication List    STOP taking these medications   glipiZIDE 5 MG 24 hr tablet Commonly known as: GLUCOTROL XL     TAKE these medications   Accu-Chek Aviva Plus test strip Generic drug: glucose blood 3 (three) times daily. for testing   Accu-Chek Softclix Lancets lancets See admin instructions.   amLODipine 10 MG tablet Commonly known as: NORVASC Take 0.5 tablets (5 mg total) by mouth daily. What changed: how much to take   aspirin 81 MG chewable tablet Chew 1 tablet (81 mg total) by mouth daily.   B-D ULTRAFINE III SHORT PEN 31G X 8 MM Misc Generic drug: Insulin Pen Needle See admin instructions.   carbamazepine 200 MG tablet Commonly known as: TEGRETOL Take 400 mg by mouth 2 (two) times daily.   cetirizine 10 MG tablet Commonly known as: ZYRTEC Take 10 mg by mouth daily.   citalopram 20 MG tablet Commonly known as: CELEXA Take 20 mg by mouth daily.   ezetimibe-simvastatin 10-40 MG tablet Commonly known as: VYTORIN Take 1 tablet by mouth daily.   furosemide 80 MG tablet Commonly known as: LASIX Take 1 tablet (80 mg total) by mouth daily. Start taking on: July 28, 2019    HumuLIN 70/30 KwikPen (70-30) 100 UNIT/ML KwikPen Generic drug: insulin isophane & regular human Inject 5 Units into the skin 2 (two) times daily.   hydrALAZINE 25 MG tablet Commonly known as: APRESOLINE Take 25 mg by mouth daily.   loperamide 2 MG capsule Commonly known as: IMODIUM Take 1 capsule (2 mg total) by mouth every 6 (six) hours as needed for diarrhea or loose stools.   mirtazapine 15 MG tablet Commonly known as: REMERON Take 15 mg by mouth at bedtime.   omeprazole 40 MG capsule Commonly known as: PRILOSEC Take 40 mg by mouth daily.   VITAMIN C PO Take 1 tablet by mouth daily.   Vitamin D (Ergocalciferol) 1.25 MG (50000 UNIT) Caps capsule Commonly known as: DRISDOL Take 50,000 Units by mouth once a week.        Follow-up Information    [name], Iona Beard, MD. Schedule an appointment as soon as possible for a visit in 1 week(s).   Specialty: Internal Medicine Why: With repeat CBC/BMP Contact information: [address] [place] [address] [phone]        Burnell Blanks, MD .   Specialty: Cardiology Contact information: [place] [place] [address] [phone]        Thompson Grayer, MD .   Specialty: Cardiology Contact information: [place]  [address] [phone]  Claudia Desanctis, MD. Schedule an appointment as soon as possible for a visit in 1 week(s).   Specialty: Internal Medicine Contact information: [address] [place] [address] [phone]              Allergies  Allergen Reactions  . Codeine Nausea And Vomiting  . Metoprolol Other (See Comments)    Severe bradycardia.  Now getting admitted for 3rd time in 3 years for bradycardia onset after being started on toprol.  Please dont put patient on this med anymore!    Consultations:  Nephrology/cardiology/EP   Procedures/Studies: CT HEAD WO CONTRAST  Result Date: 07/22/2019 CLINICAL  DATA:  Syncope. EXAM: CT HEAD WITHOUT CONTRAST TECHNIQUE: Contiguous axial images were obtained from the base of the skull through the vertex without intravenous contrast. COMPARISON:  CT head 03/07/2019.  MRI head 03/08/2019 FINDINGS: Brain: Small low-density extra-axial fluid collection on the right compatible with chronic subdural hematoma. Acute subdural hematoma on the right was seen on the prior CT. Negative for acute hemorrhage. Generalized atrophy with chronic microvascular ischemic changes in the white matter. Hypodensity in the right occipital lobe is new but appears to represent a nonacute infarct. No midline shift. Vascular: Negative for hyperdense vessel. Skull: No focal skeletal lesion. Sinuses/Orbits: Paranasal sinuses clear.  Negative orbit Other: None IMPRESSION: Small chronic subdural hematoma on the right. There was acute subdural blood on the right on the prior CT of 03/07/2019. No acute hemorrhage Chronic infarct in the right occipital lobe which is not identified on the prior MRI. Electronically Signed   By: Franchot Gallo M.D.   On: 07/22/2019 12:04   DG Chest Portable 1 View  Result Date: 07/23/2019 CLINICAL DATA:  Fall, shortness of breath EXAM: PORTABLE CHEST 1 VIEW COMPARISON:  07/21/2019 FINDINGS: Cardiomegaly. Mild vascular congestion. No confluent opacities or effusions. No acute bony abnormality or pneumothorax. Loop recorder noted over the left chest. IMPRESSION: Cardiomegaly, vascular congestion. Electronically Signed   By: Rolm Baptise M.D.   On: 07/23/2019 18:46   DG Chest Port 1 View  Result Date: 07/21/2019 CLINICAL DATA:  Near syncope, dizziness EXAM: PORTABLE CHEST 1 VIEW COMPARISON:  03/07/2019 FINDINGS: Single frontal view of the chest demonstrates loop recorder overlying cardiac apex. The cardiac silhouette remains enlarged. No airspace disease, effusion, or pneumothorax. No acute bony abnormalities. IMPRESSION: 1. Stable enlarged cardiac silhouette. 2. No acute  intrathoracic process. Electronically Signed   By: Randa Ngo M.D.   On: 07/21/2019 22:59   ECHOCARDIOGRAM COMPLETE  Result Date: 07/24/2019    ECHOCARDIOGRAM REPORT   Patient Name:   [name] Date of Exam: 07/24/2019 Medical Rec #:  [id number]      Height:       62.0 in Accession #:    [id number]     Weight:       234.8 lb Date of Birth:  1945/06/11       BSA:          2.047 m Patient Age:    12 years       BP:           107/80 mmHg Patient Gender: F              HR:           72 bpm. Exam Location:  Inpatient Procedure: 2D Echo Indications:    acute diastolic chf 025.85  History:        Patient has prior history of Echocardiogram examinations, most  recent 03/07/2019. CHF, chronic kidney disease,                 Signs/Symptoms:Shortness of Breath and elevated troponin; Risk                 Factors:Diabetes and Hypertension.  Sonographer:    Johny Chess Referring Phys: [id number] [place]  1. Left ventricular ejection fraction, by estimation, is 60 to 65%. The left ventricle has normal function. The left ventricle has no regional wall motion abnormalities. There is mild concentric left ventricular hypertrophy. Left ventricular diastolic parameters are consistent with Grade I diastolic dysfunction (impaired relaxation). Elevated left atrial pressure.  2. Right ventricular systolic function is mildly reduced. The right ventricular size is mildly enlarged. Mildly increased right ventricular wall thickness. There is mildly elevated pulmonary artery systolic pressure.  3. Left atrial size was moderately dilated.  4. Right atrial size was mildly dilated.  5. The mitral valve is normal in structure. No evidence of mitral valve regurgitation. No evidence of mitral stenosis.  6. The aortic valve is normal in structure. Aortic valve regurgitation is not visualized. No aortic stenosis is present.  7. The inferior vena cava is normal in size with greater than 50% respiratory  variability, suggesting right atrial pressure of 3 mmHg. FINDINGS  Left Ventricle: Left ventricular ejection fraction, by estimation, is 60 to 65%. The left ventricle has normal function. The left ventricle has no regional wall motion abnormalities. The left ventricular internal cavity size was normal in size. There is  mild concentric left ventricular hypertrophy. Left ventricular diastolic parameters are consistent with Grade I diastolic dysfunction (impaired relaxation). Elevated left atrial pressure. Right Ventricle: The right ventricular size is mildly enlarged. Mildly increased right ventricular wall thickness. Right ventricular systolic function is mildly reduced. There is mildly elevated pulmonary artery systolic pressure. The tricuspid regurgitant velocity is 2.76 m/s, and with an assumed right atrial pressure of 3 mmHg, the estimated right ventricular systolic pressure is 87.6 mmHg. Left Atrium: Left atrial size was moderately dilated. Right Atrium: Right atrial size was mildly dilated. Pericardium: There is no evidence of pericardial effusion. Mitral Valve: The mitral valve is normal in structure. Normal mobility of the mitral valve leaflets. No evidence of mitral valve regurgitation. No evidence of mitral valve stenosis. Tricuspid Valve: The tricuspid valve is normal in structure. Tricuspid valve regurgitation is not demonstrated. No evidence of tricuspid stenosis. Aortic Valve: The aortic valve is normal in structure. Aortic valve regurgitation is not visualized. No aortic stenosis is present. Pulmonic Valve: The pulmonic valve was normal in structure. Pulmonic valve regurgitation is not visualized. No evidence of pulmonic stenosis. Aorta: The aortic root is normal in size and structure. Venous: The inferior vena cava is normal in size with greater than 50% respiratory variability, suggesting right atrial pressure of 3 mmHg. IAS/Shunts: No atrial level shunt detected by color flow Doppler.  LEFT  VENTRICLE PLAX 2D LVIDd:         3.90 cm  Diastology LVIDs:         2.80 cm  LV e' lateral:   6.00 cm/s LV PW:         1.20 cm  LV E/e' lateral: 14.8 LVOT diam:     2.10 cm  LV e' medial:    6.09 cm/s LV SV:         67       LV E/e' medial:  14.6 LV SV Index:   33 LVOT Area:  3.46 cm  RIGHT VENTRICLE            IVC RV S prime:     9.90 cm/s  IVC diam: 1.60 cm TAPSE (M-mode): 1.6 cm LEFT ATRIUM             Index       RIGHT ATRIUM           Index LA diam:        4.60 cm 2.25 cm/m  RA Area:     13.70 cm LA Vol (A2C):   54.8 ml 26.78 ml/m RA Volume:   32.00 ml  15.64 ml/m LA Vol (A4C):   57.3 ml 28.00 ml/m LA Biplane Vol: 58.5 ml 28.58 ml/m  AORTIC VALVE LVOT Vmax:   93.30 cm/s LVOT Vmean:  57.100 cm/s LVOT VTI:    0.193 m  AORTA Ao Root diam: 2.80 cm Ao Asc diam:  2.70 cm MITRAL VALVE                TRICUSPID VALVE MV Area (PHT): 2.76 cm     TR Peak grad:   30.5 mmHg MV Decel Time: 275 msec     TR Vmax:        276.00 cm/s MV E velocity: 88.70 cm/s MV A velocity: 103.00 cm/s  SHUNTS MV E/A ratio:  0.86         Systemic VTI:  0.19 m                             Systemic Diam: 2.10 cm Dani Gobble Croitoru MD Electronically signed by Sanda Klein MD Signature Date/Time: 07/24/2019/10:50:37 AM    Final    CUP PACEART REMOTE DEVICE CHECK  Result Date: 07/09/2019 [facility] summary report received. Battery status OK. Normal device function. No new symptom episodes, tachy episodes, brady, or pause episodes. No new AF episodes. Monthly summary reports and ROV/PRN [name] Breach, RN, CCDS, [employer]  VAS [place] LOWER EXTREMITY VENOUS (DVT) (ONLY [facility] & [facility] 7a-7p)  Result Date: 07/24/2019  Lower Venous DVTStudy Indications: Edema.  Limitations: Body habitus and poor ultrasound/tissue interface. Comparison Study: 03/06/19 previous Performing Technologist: Abram Sander RVS  Examination Guidelines: A complete evaluation includes B-mode imaging, spectral Doppler, color Doppler, and power Doppler as needed of all  accessible portions of each vessel. Bilateral testing is considered an integral part of a complete examination. Limited examinations for reoccurring indications may be performed as noted. The reflux portion of the exam is performed with the patient in reverse Trendelenburg.  +---------+---------------+---------+-----------+----------+------------------+ RIGHT    CompressibilityPhasicitySpontaneityPropertiesThrombus Aging     +---------+---------------+---------+-----------+----------+------------------+ CFV      Full           Yes      Yes                                     +---------+---------------+---------+-----------+----------+------------------+ SFJ      Full                                                            +---------+---------------+---------+-----------+----------+------------------+ FV Prox  Full                                                            +---------+---------------+---------+-----------+----------+------------------+  FV Mid                  Yes      Yes                                     +---------+---------------+---------+-----------+----------+------------------+ FV Distal                                             Not visualized     +---------+---------------+---------+-----------+----------+------------------+ PFV      Full                                                            +---------+---------------+---------+-----------+----------+------------------+ POP      Full           Yes      Yes                                     +---------+---------------+---------+-----------+----------+------------------+ PTV      Full                                         limited                                                                  visualization      +---------+---------------+---------+-----------+----------+------------------+ PERO     Full                                         limited                                                                   visualization      +---------+---------------+---------+-----------+----------+------------------+   +---------+---------------+---------+-----------+----------+------------------+ LEFT     CompressibilityPhasicitySpontaneityPropertiesThrombus Aging     +---------+---------------+---------+-----------+----------+------------------+ CFV      Full           Yes      Yes                                     +---------+---------------+---------+-----------+----------+------------------+ SFJ      Full                                                            +---------+---------------+---------+-----------+----------+------------------+  FV Prox  Full                                                            +---------+---------------+---------+-----------+----------+------------------+ FV Mid                  Yes      Yes                                     +---------+---------------+---------+-----------+----------+------------------+ FV Distal                                             Not visualized     +---------+---------------+---------+-----------+----------+------------------+ PFV      Full                                                            +---------+---------------+---------+-----------+----------+------------------+ POP      Full           Yes      Yes                                     +---------+---------------+---------+-----------+----------+------------------+ PTV      Full                                         limited                                                                  visualization      +---------+---------------+---------+-----------+----------+------------------+ PERO     Full                                         limited                                                                  visualization       +---------+---------------+---------+-----------+----------+------------------+     Summary: BILATERAL: -No evidence of popliteal cyst, bilaterally. RIGHT: - There is no evidence of deep vein thrombosis in the lower extremity. However, portions of this examination were limited- see technologist comments above.  LEFT: - There is no evidence of deep vein thrombosis in the lower extremity.  However, portions of this examination were limited- see technologist comments above.  *See table(s) above for measurements and observations. Electronically signed by Deitra Mayo MD on 07/24/2019 at 8:50:12 AM.    Final        Subjective: Patient seen and examined at bedside.  Awake, poor historian, denies any worsening shortness of breath.  No chest pain reported.  No overnight fever or vomiting reported.  Discharge Exam: Vitals:   07/26/19 2033 07/27/19 0407  BP: (!) 147/86 (!) 141/71  Pulse: 81 81  Resp: 19 19  Temp: 99.1 F (37.3 C) 99.4 F (37.4 C)  SpO2: 95%     General: Pt is awake.  Poor historian.  No distress.   Cardiovascular: rate controlled, S1/S2 + Respiratory: bilateral decreased breath sounds at bases with basilar crackles Abdominal: Soft, morbidly obese, NT, ND, bowel sounds + Extremities: Trace bilateral lower extremity edema present; no cyanosis    The results of significant diagnostics from this hospitalization (including imaging, microbiology, ancillary and laboratory) are listed below for reference.     Microbiology: Recent Results (from the past 240 hour(s))  SARS Coronavirus 2 by RT PCR (hospital order, performed in [facility] hospital lab) Nasopharyngeal Nasopharyngeal Swab     Status: None   Collection Time: 07/22/19  1:05 AM   Specimen: Nasopharyngeal Swab  Result Value Ref Range Status   SARS Coronavirus 2 NEGATIVE NEGATIVE Final    Comment: (NOTE) SARS-CoV-2 target nucleic acids are NOT DETECTED.  The SARS-CoV-2 RNA is generally detectable in upper and  lower respiratory specimens during the acute phase of infection. The lowest concentration of SARS-CoV-2 viral copies this assay can detect is 250 copies / mL. A negative result does not preclude SARS-CoV-2 infection and should not be used as the sole basis for treatment or other patient management decisions.  A negative result may occur with improper specimen collection / handling, submission of specimen other than nasopharyngeal swab, presence of viral mutation(s) within the areas targeted by this assay, and inadequate number of viral copies (<250 copies / mL). A negative result must be combined with clinical observations, patient history, and epidemiological information.  Fact Sheet for Patients:   StrictlyIdeas.no  Fact Sheet for Healthcare Providers: BankingDealers.co.za  This test is not yet approved or  cleared by the [place] FDA and has been authorized for detection and/or diagnosis of SARS-CoV-2 by FDA under an Emergency Use Authorization (EUA).  This EUA will remain in effect (meaning this test can be used) for the duration of the COVID-19 declaration under Section 564(b)(1) of the Act, 21 U.S.C. section 360bbb-3(b)(1), unless the authorization is terminated or revoked sooner.  Performed at [facility], [place] [address], [place], [place] [address]   SARS Coronavirus 2 by RT PCR (hospital order, performed in [facility] hospital lab) Nasopharyngeal Nasopharyngeal Swab     Status: None   Collection Time: 07/23/19  7:55 PM   Specimen: Nasopharyngeal Swab  Result Value Ref Range Status   SARS Coronavirus 2 NEGATIVE NEGATIVE Final    Comment: (NOTE) SARS-CoV-2 target nucleic acids are NOT DETECTED.  The SARS-CoV-2 RNA is generally detectable in upper and lower respiratory specimens during the acute phase of infection. The lowest concentration of SARS-CoV-2 viral copies this assay can detect is 250 copies / mL. A  negative result does not preclude SARS-CoV-2 infection and should not be used as the sole basis for treatment or other patient management decisions.  A negative result may occur with improper specimen  collection / handling, submission of specimen other than nasopharyngeal swab, presence of viral mutation(s) within the areas targeted by this assay, and inadequate number of viral copies (<250 copies / mL). A negative result must be combined with clinical observations, patient history, and epidemiological information.  Fact Sheet for Patients:   StrictlyIdeas.no  Fact Sheet for Healthcare Providers: BankingDealers.co.za  This test is not yet approved or  cleared by the [place] FDA and has been authorized for detection and/or diagnosis of SARS-CoV-2 by FDA under an Emergency Use Authorization (EUA).  This EUA will remain in effect (meaning this test can be used) for the duration of the COVID-19 declaration under Section 564(b)(1) of the Act, 21 U.S.C. section 360bbb-3(b)(1), unless the authorization is terminated or revoked sooner.  Performed at [facility], [place] [address] [place], [place] [address]   SARS Coronavirus 2 by RT PCR (hospital order, performed in [facility] hospital lab) Nasopharyngeal Nasopharyngeal Swab     Status: None   Collection Time: 07/26/19 11:06 AM   Specimen: Nasopharyngeal Swab  Result Value Ref Range Status   SARS Coronavirus 2 NEGATIVE NEGATIVE Final    Comment: (NOTE) SARS-CoV-2 target nucleic acids are NOT DETECTED.  The SARS-CoV-2 RNA is generally detectable in upper and lower respiratory specimens during the acute phase of infection. The lowest concentration of SARS-CoV-2 viral copies this assay can detect is 250 copies / mL. A negative result does not preclude SARS-CoV-2 infection and should not be used as the sole basis for treatment or other patient management decisions.  A negative  result may occur with improper specimen collection / handling, submission of specimen other than nasopharyngeal swab, presence of viral mutation(s) within the areas targeted by this assay, and inadequate number of viral copies (<250 copies / mL). A negative result must be combined with clinical observations, patient history, and epidemiological information.  Fact Sheet for Patients:   StrictlyIdeas.no  Fact Sheet for Healthcare Providers: BankingDealers.co.za  This test is not yet approved or  cleared by the [place] FDA and has been authorized for detection and/or diagnosis of SARS-CoV-2 by FDA under an Emergency Use Authorization (EUA).  This EUA will remain in effect (meaning this test can be used) for the duration of the COVID-19 declaration under Section 564(b)(1) of the Act, 21 U.S.C. section 360bbb-3(b)(1), unless the authorization is terminated or revoked sooner.  Performed at [facility], [place] [address], [place], [place] [address]      Labs: BNP (last 3 results) Recent Labs    03/04/19 0954 07/21/19 2316 07/23/19 1834  BNP 1,112.8* 796.1* 5,102.5*   Basic Metabolic Panel: Recent Labs  Lab 07/22/19 0519 07/23/19 1834 07/25/19 0800 07/26/19 0523 07/27/19 0401  NA 130* 128* 133* 134* 134*  K 4.5 5.3* 4.4 4.6 4.2  CL 97* 94* 98 100 99  CO2 23 19* 22 21* 24  GLUCOSE 100* 239* 92 158* 260*  BUN 51* 63* 69* 67* 62*  CREATININE 2.83* 3.36* 3.10* 2.90* 2.89*  CALCIUM 8.7* 8.9 8.9 8.7* 9.0  MG  --  2.2 2.2 2.3 2.2  PHOS  --   --   --  3.9  --    Liver Function Tests: Recent Labs  Lab 07/23/19 1834  AST 18  ALT 17  ALKPHOS 89  BILITOT 0.9  PROT 7.3  ALBUMIN 3.6   No results for input(s): LIPASE, AMYLASE in the last 168 hours. No results for input(s): AMMONIA in the last 168 hours. CBC:  Recent Labs  Lab 07/21/19 2316 07/23/19 1834 07/25/19 0800 07/26/19 0523  WBC 5.5 11.6* 7.4 8.0   NEUTROABS 4.6 10.5* 5.5 6.6  HGB 8.2* 8.9* 9.3* 8.7*  HCT 24.6* 26.8* 27.8* 26.3*  MCV 86.3 86.7 88.3 87.7  PLT 235 254 265 274   Cardiac Enzymes: No results for input(s): CKTOTAL, CKMB, CKMBINDEX, TROPONINI in the last 168 hours. BNP: Invalid input(s): POCBNP CBG: Recent Labs  Lab 07/26/19 0629 07/26/19 1104 07/26/19 1622 07/26/19 2112 07/27/19 0621  GLUCAP 168* 150* 193* 248* 227*   D-Dimer No results for input(s): DDIMER in the last 72 hours. Hgb A1c No results for input(s): HGBA1C in the last 72 hours. Lipid Profile No results for input(s): CHOL, HDL, LDLCALC, TRIG, CHOLHDL, LDLDIRECT in the last 72 hours. Thyroid function studies No results for input(s): TSH, T4TOTAL, T3FREE, THYROIDAB in the last 72 hours.  Invalid input(s): FREET3 Anemia work up Recent Labs    07/25/19 1459  FERRITIN 35  TIBC 351  IRON 17*   Urinalysis    Component Value Date/Time   COLORURINE YELLOW 07/21/2019 2346   [place] 07/21/2019 2346   LABSPEC 1.006 07/21/2019 2346   PHURINE 5.0 07/21/2019 2346   GLUCOSEU >=500 (A) 07/21/2019 2346   [place] NEGATIVE 07/21/2019 2346   [place] NEGATIVE 07/21/2019 2346   KETONESUR NEGATIVE 07/21/2019 2346   PROTEINUR NEGATIVE 07/21/2019 2346   UROBILINOGEN 0.2 11/14/2014 1946   NITRITE NEGATIVE 07/21/2019 2346   LEUKOCYTESUR NEGATIVE 07/21/2019 2346   Sepsis Labs Invalid input(s): PROCALCITONIN,  WBC,  LACTICIDVEN Microbiology Recent Results (from the past 240 hour(s))  SARS Coronavirus 2 by RT PCR (hospital order, performed in [place] hospital lab) Nasopharyngeal Nasopharyngeal Swab     Status: None   Collection Time: 07/22/19  1:05 AM   Specimen: Nasopharyngeal Swab  Result Value Ref Range Status   SARS Coronavirus 2 NEGATIVE NEGATIVE Final    Comment: (NOTE) SARS-CoV-2 target nucleic acids are NOT DETECTED.  The SARS-CoV-2 RNA is generally detectable in upper and lower respiratory specimens during the acute phase of  infection. The lowest concentration of SARS-CoV-2 viral copies this assay can detect is 250 copies / mL. A negative result does not preclude SARS-CoV-2 infection and should not be used as the sole basis for treatment or other patient management decisions.  A negative result may occur with improper specimen collection / handling, submission of specimen other than nasopharyngeal swab, presence of viral mutation(s) within the areas targeted by this assay, and inadequate number of viral copies (<250 copies / mL). A negative result must be combined with clinical observations, patient history, and epidemiological information.  Fact Sheet for Patients:   StrictlyIdeas.no  Fact Sheet for Healthcare Providers: BankingDealers.co.za  This test is not yet approved or  cleared by the [place] FDA and has been authorized for detection and/or diagnosis of SARS-CoV-2 by FDA under an Emergency Use Authorization (EUA).  This EUA will remain in effect (meaning this test can be used) for the duration of the COVID-19 declaration under Section 564(b)(1) of the Act, 21 U.S.C. section 360bbb-3(b)(1), unless the authorization is terminated or revoked sooner.  Performed at [facility], [place] [address], [place], [place] [address]   SARS Coronavirus 2 by RT PCR (hospital order, performed in [facility] hospital lab) Nasopharyngeal Nasopharyngeal Swab     Status: None   Collection Time: 07/23/19  7:55 PM   Specimen: Nasopharyngeal Swab  Result Value Ref Range Status   SARS Coronavirus 2  NEGATIVE NEGATIVE Final    Comment: (NOTE) SARS-CoV-2 target nucleic acids are NOT DETECTED.  The SARS-CoV-2 RNA is generally detectable in upper and lower respiratory specimens during the acute phase of infection. The lowest concentration of SARS-CoV-2 viral copies this assay can detect is 250 copies / mL. A negative result does not preclude SARS-CoV-2 infection and  should not be used as the sole basis for treatment or other patient management decisions.  A negative result may occur with improper specimen collection / handling, submission of specimen other than nasopharyngeal swab, presence of viral mutation(s) within the areas targeted by this assay, and inadequate number of viral copies (<250 copies / mL). A negative result must be combined with clinical observations, patient history, and epidemiological information.  Fact Sheet for Patients:   StrictlyIdeas.no  Fact Sheet for Healthcare Providers: BankingDealers.co.za  This test is not yet approved or  cleared by the [place] FDA and has been authorized for detection and/or diagnosis of SARS-CoV-2 by FDA under an Emergency Use Authorization (EUA).  This EUA will remain in effect (meaning this test can be used) for the duration of the COVID-19 declaration under Section 564(b)(1) of the Act, 21 U.S.C. section 360bbb-3(b)(1), unless the authorization is terminated or revoked sooner.  Performed at [facility], [place] [address] [place], [place] [address]   SARS Coronavirus 2 by RT PCR (hospital order, performed in [facility] hospital lab) Nasopharyngeal Nasopharyngeal Swab     Status: None   Collection Time: 07/26/19 11:06 AM   Specimen: Nasopharyngeal Swab  Result Value Ref Range Status   SARS Coronavirus 2 NEGATIVE NEGATIVE Final    Comment: (NOTE) SARS-CoV-2 target nucleic acids are NOT DETECTED.  The SARS-CoV-2 RNA is generally detectable in upper and lower respiratory specimens during the acute phase of infection. The lowest concentration of SARS-CoV-2 viral copies this assay can detect is 250 copies / mL. A negative result does not preclude SARS-CoV-2 infection and should not be used as the sole basis for treatment or other patient management decisions.  A negative result may occur with improper specimen collection / handling,  submission of specimen other than nasopharyngeal swab, presence of viral mutation(s) within the areas targeted by this assay, and inadequate number of viral copies (<250 copies / mL). A negative result must be combined with clinical observations, patient history, and epidemiological information.  Fact Sheet for Patients:   StrictlyIdeas.no  Fact Sheet for Healthcare Providers: BankingDealers.co.za  This test is not yet approved or  cleared by the [place] FDA and has been authorized for detection and/or diagnosis of SARS-CoV-2 by FDA under an Emergency Use Authorization (EUA).  This EUA will remain in effect (meaning this test can be used) for the duration of the COVID-19 declaration under Section 564(b)(1) of the Act, 21 U.S.C. section 360bbb-3(b)(1), unless the authorization is terminated or revoked sooner.  Performed at [facility], [place] [address], [place], [place] [address]      Time coordinating discharge: 35 minutes  SIGNED:   Aline August, MD  [facility] 07/27/2019, 10:02 AM

## 2019-07-28 DIAGNOSIS — R112 Nausea with vomiting, unspecified: Secondary | ICD-10-CM | POA: Diagnosis not present

## 2019-07-29 DIAGNOSIS — R000 Tachycardia, unspecified: Secondary | ICD-10-CM | POA: Diagnosis not present

## 2019-07-29 DIAGNOSIS — Z743 Need for continuous supervision: Secondary | ICD-10-CM | POA: Diagnosis not present

## 2019-07-29 DIAGNOSIS — I499 Cardiac arrhythmia, unspecified: Secondary | ICD-10-CM | POA: Diagnosis not present

## 2019-07-29 DIAGNOSIS — J9690 Respiratory failure, unspecified, unspecified whether with hypoxia or hypercapnia: Secondary | ICD-10-CM | POA: Diagnosis not present

## 2019-07-29 DIAGNOSIS — E1165 Type 2 diabetes mellitus with hyperglycemia: Secondary | ICD-10-CM | POA: Diagnosis not present

## 2019-08-08 ENCOUNTER — Encounter: Payer: Self-pay | Admitting: General Practice

## 2019-08-21 ENCOUNTER — Encounter: Payer: Self-pay | Admitting: Adult Health

## 2019-08-21 ENCOUNTER — Ambulatory Visit: Payer: Medicare Other | Admitting: Adult Health

## 2019-08-21 NOTE — Progress Notes (Deleted)
[facility] [address] [place]. [place] [address] [phone]       STROKE FOLLOW UP NOTE  [name] Date of Birth:  1945/03/13 Medical Record Number:  [id number]   Referring MD: Rosalin Hawking Reason for Referral: Stroke  Chief complaint: No chief complaint on file.    HPI:   Today, 08/21/2019, [name] returns for stroke follow-up.  Overall stable from stroke standpoint since prior visit without residual deficits or new stroke/TIA symptoms.  Loop recorder placed by Dr. Rayann Heman in 05/2019 which has not shown atrial fibrillation thus far.  Continues on aspirin 81 mg daily without bleeding or bruising and Vytorin without myalgias.  Blood pressure today ***.  Remote history of seizures which have been stable with ongoing use of carbamazepine 400 mg twice daily.  Since prior visit, she has had 2 hospital admissions at the end of June for near syncope felt likely due to dehydration (07/21/2019) and hypoxia likely secondary to CHF exacerbation (07/23/2019) discharge to SNF for therapy needs.  CT head completed during admission reporting small chronic subdural hematoma on the right and chronic infarct in the right occipital lobe not identified on prior MRI (prior MRI limited with significant motion degraded).      History provided for reference purposes only Initial visit 05/02/2019 [name] is a 74 year old African-American lady seen today for initial office consultation visit for stroke.  She is accompanied by her granddaughter.  History is obtained from them, review of electronic medical records and I personally reviewed imaging films in PACS.[name] is a 74 y.o. female with past medical history of with past medical history of chronic diastolic heart failure, diabetes mellitus, hypertension, CKD stage IV, morbid obesity, obstructive sleep apnea, first-degree AV block admitted to [facility] cardiology service for acute on chronic heart failure 2 days  ago.Patient was her normal self this morning and had 6:15 AM, around 7:15 AM when the nurses went to check on her she was noted to be nonverbal, paralyzed on the right side with gaze preference to the left.  Code stroke was activated and patient was immediately taken to CT scan.On assessment, NIH stroke scale was 25.  Stat CT head was obtained which showed a right subdural hemorrhage.  CT angiogram showed a left M1 occlusion and patient was taken to mechanical thrombectomy after obtaining consent from daughter.Patient's baseline prior to admission was she was living at home, independent for most ADLs.  Needed a walker to walk due to shortness of breath after a few steps.  She had a few falls prior to admission which likely explains right-sided acute subdural hemorrhage. Date last known well: 2.10. 2021 Time last known well: 6:15 AM tPA Given: no, subdural hemorrhage. NIHSS: 25 Baseline MRS 2 patient was taken for emergent mechanical thrombectomy and underwent successful revascularization of the occluded left M1 segment.  She was admitted to the neurological intensive care unit where blood pressure was tightly controlled.  She was extubated and did well.  MRI scan of the brain subsequently showed 2 small left parietal and caudate and lentiform nucleus infarcts the study was suboptimal and incomplete.  There is a small subdural noted in the right frontal convexity which is stable.  MRA showed restored flow in the left M1 with changes of atherosclerosis in bilateral cavernous carotids and right V4 segment and moderate stenosis.  Lower extremity venous Dopplers were negative for DVT.  2D echo showed normal ejection fraction.  LDL cholesterol was 55 mg  percent.  Hemoglobin A1c was 5.6.  Patient was kept on telemetry monitoring and no A. fib was found.  She did not get a loop recorder as she had subdural hemorrhage and was not candidate for short-term anticoagulation at that time.  Patient was seen by physical  occupational therapy and was felt stable enough to begin discharge to skilled nursing facility for ongoing therapies.  She states she is done well since discharge.  She is currently living at home.  She has made full recovery.  She still gets short of breath with walking and uses a walker and feels her balance may not be the same.  She still has some leg swelling.  But she denies any neurological symptoms or deficits.  She has remote history of seizures and she is on Tegretol and has remained stable for years without breakthrough seizures.  ROS:   14 system review of systems is positive for shortness of breath,, gait difficulty, imbalance, leg swelling and all other systems negative PMH:  Past Medical History:  Diagnosis Date  . 1st degree AV block 05/02/2017  . Anemia   . Arterial embolism and thrombosis of upper extremity ([place])   . Chronic diastolic (congestive) heart failure ([place])    a. 04/2017 Echo: EF 60-65%. Gr1 DD. Mod LVH.   . CKD (chronic kidney disease) stage 4, GFR 15-29 ml/min (HCC) 05/02/2017  . Depression   . Diabetes mellitus type 2, insulin dependent ([place]) 05/02/2017  . DVT (deep venous thrombosis) ([place])   . Dyslipidemia 05/02/2017  . Essential hypertension   . GERD (gastroesophageal reflux disease)   . Hyperlipidemia   . Junctional bradycardia   . Morbid obesity with BMI of 45.0-49.9, adult ([place]) 05/02/2017  . OSA (obstructive sleep apnea) 10/17/2012  . Rhabdomyolysis 09/25/2012  . Seizures ([place])    "epilepsy; they think I had one a couple months ago" (12/11/2014)  . Symptomatic bradycardia 11/27/2016    Social History:  Social History   Socioeconomic History  . Marital status: Divorced    Spouse name: Not on file  . Number of children: 2  . Years of education: 10  . Highest education level: Not on file  Occupational History  . Occupation: Retired  Tobacco Use  . Smoking status: Never Smoker  . Smokeless tobacco: Never Used  . Tobacco comment: EXPOSED TO 2ND HAND SMOKE X  20+ YEARS.   Vaping Use  . Vaping Use: Never used  Substance and Sexual Activity  . Alcohol use: No  . Drug use: No  . Sexual activity: Never  Other Topics Concern  . Not on file  Social History Narrative   Lives in [place] by herself.  Daughters nearby.  Sedentary.   Eats fast food for at least one meal almost daily.   Caffeine use:  Drinks soda daily.   Coffee rarely.    Social Determinants of Health   Financial Resource Strain:   . Difficulty of Paying Living Expenses:   Food Insecurity:   . Worried About Charity fundraiser in the Last Year:   . Arboriculturist in the Last Year:   Transportation Needs:   . Film/video editor (Medical):   [name] Lack of Transportation (Non-Medical):   Physical Activity:   . Days of Exercise per Week:   . Minutes of Exercise per Session:   Stress:   . Feeling of Stress :   Social Connections:   . Frequency of Communication with Friends and Family:   .  Frequency of Social Gatherings with Friends and Family:   . Attends Religious Services:   . Active Member of Clubs or Organizations:   . Attends Archivist Meetings:   [name] Marital Status:   Intimate Partner Violence:   . Fear of Current or Ex-Partner:   . Emotionally Abused:   [name] Physically Abused:   . Sexually Abused:     Medications:   Current Outpatient Medications on File Prior to Visit  Medication Sig Dispense Refill  . ACCU-CHEK AVIVA PLUS test strip 3 (three) times daily. for testing    . Accu-Chek Softclix Lancets lancets See admin instructions.    [name] amLODipine (NORVASC) 10 MG tablet Take 0.5 tablets (5 mg total) by mouth daily.    . Ascorbic Acid (VITAMIN C PO) Take 1 tablet by mouth daily.    [name] aspirin 81 MG chewable tablet Chew 1 tablet (81 mg total) by mouth daily.    . B-D ULTRAFINE III SHORT PEN 31G X 8 MM MISC See admin instructions.    . carbamazepine (TEGRETOL) 200 MG tablet Take 400 mg by mouth 2 (two) times daily.     . cetirizine (ZYRTEC) 10 MG tablet Take 10  mg by mouth daily.    . citalopram (CELEXA) 20 MG tablet Take 20 mg by mouth daily.    [name] ezetimibe-simvastatin (VYTORIN) 10-40 MG per tablet Take 1 tablet by mouth daily.    . furosemide (LASIX) 80 MG tablet Take 1 tablet (80 mg total) by mouth daily. 30 tablet 0  . HUMULIN 70/30 KWIKPEN (70-30) 100 UNIT/ML PEN Inject 5 Units into the skin 2 (two) times daily.    . hydrALAZINE (APRESOLINE) 25 MG tablet Take 25 mg by mouth daily.     [name] loperamide (IMODIUM) 2 MG capsule Take 1 capsule (2 mg total) by mouth every 6 (six) hours as needed for diarrhea or loose stools. 14 capsule 0  . mirtazapine (REMERON) 15 MG tablet Take 15 mg by mouth at bedtime.     [name] omeprazole (PRILOSEC) 40 MG capsule Take 40 mg by mouth daily.    . Vitamin D, Ergocalciferol, (DRISDOL) 1.25 MG (50000 UNIT) CAPS capsule Take 50,000 Units by mouth once a week.     No current facility-administered medications on file prior to visit.    Allergies:   Allergies  Allergen Reactions  . Codeine Nausea And Vomiting  . Metoprolol Other (See Comments)    Severe bradycardia.  Now getting admitted for 3rd time in 3 years for bradycardia onset after being started on toprol.  Please dont put patient on this med anymore!    Physical Exam General: Morbidly obese elderly African-American lady seated, in no evident distress Head: head normocephalic and atraumatic.   Neck: supple with no carotid or supraclavicular bruits Cardiovascular: regular rate and rhythm, no murmurs Musculoskeletal: no deformity Skin:  no rash/petichiae Vascular:  Normal pulses all extremities  Neurologic Exam Mental Status: Awake and fully alert. Oriented to place and time. Recent and remote memory intact. Attention span, concentration and fund of knowledge appropriate. Mood and affect appropriate.  Cranial Nerves: Fundoscopic exam reveals sharp disc margins. Pupils equal, briskly reactive to light. Extraocular movements full without nystagmus. Visual fields full  to confrontation. Hearing intact. Facial sensation intact. Face, tongue, palate moves normally and symmetrically.  Motor: Normal bulk and tone. Normal strength in all tested extremity muscles. Sensory.: intact to touch , pinprick , position and vibratory sensation.  Coordination: Rapid alternating movements normal in all  extremities. Finger-to-nose and heel-to-shin performed accurately bilaterally. Gait and Station: Arises from chair without difficulty. Stance is normal. Gait demonstrates normal stride length and balance . Able to heel, toe and tandem walk with great difficulty.  Reflexes: 1+ and symmetric. Toes downgoing.   NIHSS  0 Modified [place]in  1   ASSESSMENT/PLAN: 74 year old African-American lady with embolic left middle cerebral artery infarct and February 2021 treated with successful mechanical thrombectomy with excellent clinical outcome.  Vascular risk factors of obesity, diabetes, hypertension hyperlipidemia.  She had small subdural hematoma during her presentation with stroke.  Remote history of seizure disorder which is stable on current dose of Tegretol 400 twice daily     L MCA STROKE -s/p loop recorder 06/06/2019  SUBDURAL HEMATOMA -Repeat CT head  SEIZURE DISORDER -Stable -Remains on Tegretol 400mg  BID  HTN -BP goal<130/90  HLD -LDL goal<70  DM -A1c goal<7  I had a long d/w patient and her grand daughter about her recent cryptogenic stroke, risk for recurrent stroke/TIAs, personally independently reviewed imaging studies and stroke evaluation results and answered questions.Continue aspirin 81 mg daily  for secondary stroke prevention and maintain strict control of hypertension with blood pressure goal below 130/90, diabetes with hemoglobin A1c goal below 6.5% and lipids with LDL cholesterol goal below 70 mg/dL. I also advised the patient to eat a healthy diet with plenty of whole grains, cereals, fruits and vegetables, exercise regularly and maintain ideal body  weight.  Check follow-up CT scan of the head to look for resolution of subdural hematoma.  Refer to cardiology for outpatient loop recorder for paroxysmal A. fib.  Continue Tegretol in the current dose of 400 mg twice daily for seizure disorder which appears stable   I spent *** minutes of face-to-face and non-face-to-face time with patient.  This included previsit chart review, lab review, study review, order entry, electronic health record documentation, patient education   [name], [facility]  [facility] [address] [place], [place] [address]  Phone [phone] Fax [phone] Note: This document was prepared with digital dictation and possible smart phrase technology. Any transcriptional errors that result from this process are unintentional.

## 2019-10-17 ENCOUNTER — Ambulatory Visit: Payer: Medicare Other | Admitting: Podiatry
